# Patient Record
Sex: Female | Born: 1945
Health system: Southern US, Community
[De-identification: ages and names within clinical notes are randomized; demographics above are authoritative.]

## PROBLEM LIST (undated history)

## (undated) DIAGNOSIS — K639 Disease of intestine, unspecified: Secondary | ICD-10-CM

## (undated) DIAGNOSIS — G8929 Other chronic pain: Secondary | ICD-10-CM

## (undated) DIAGNOSIS — F32A Depression, unspecified: Secondary | ICD-10-CM

## (undated) DIAGNOSIS — F329 Major depressive disorder, single episode, unspecified: Secondary | ICD-10-CM

## (undated) DIAGNOSIS — M1990 Unspecified osteoarthritis, unspecified site: Secondary | ICD-10-CM

## (undated) DIAGNOSIS — G479 Sleep disorder, unspecified: Secondary | ICD-10-CM

## (undated) DIAGNOSIS — E119 Type 2 diabetes mellitus without complications: Secondary | ICD-10-CM

## (undated) DIAGNOSIS — R21 Rash and other nonspecific skin eruption: Secondary | ICD-10-CM

## (undated) DIAGNOSIS — Z87442 Personal history of urinary calculi: Secondary | ICD-10-CM

## (undated) DIAGNOSIS — M25551 Pain in right hip: Secondary | ICD-10-CM

## (undated) DIAGNOSIS — K222 Esophageal obstruction: Secondary | ICD-10-CM

## (undated) DIAGNOSIS — I700 Atherosclerosis of aorta: Secondary | ICD-10-CM

## (undated) DIAGNOSIS — I7789 Other specified disorders of arteries and arterioles: Secondary | ICD-10-CM

## (undated) DIAGNOSIS — I219 Acute myocardial infarction, unspecified: Secondary | ICD-10-CM

## (undated) DIAGNOSIS — K449 Diaphragmatic hernia without obstruction or gangrene: Secondary | ICD-10-CM

## (undated) DIAGNOSIS — I471 Supraventricular tachycardia, unspecified: Secondary | ICD-10-CM

## (undated) DIAGNOSIS — M329 Systemic lupus erythematosus, unspecified: Secondary | ICD-10-CM

## (undated) DIAGNOSIS — G4700 Insomnia, unspecified: Secondary | ICD-10-CM

## (undated) DIAGNOSIS — R0600 Dyspnea, unspecified: Secondary | ICD-10-CM

## (undated) DIAGNOSIS — E785 Hyperlipidemia, unspecified: Secondary | ICD-10-CM

## (undated) DIAGNOSIS — I77810 Thoracic aortic ectasia: Secondary | ICD-10-CM

## (undated) DIAGNOSIS — J45909 Unspecified asthma, uncomplicated: Secondary | ICD-10-CM

## (undated) DIAGNOSIS — K2289 Other specified disease of esophagus: Secondary | ICD-10-CM

## (undated) DIAGNOSIS — R7303 Prediabetes: Secondary | ICD-10-CM

## (undated) DIAGNOSIS — I10 Essential (primary) hypertension: Secondary | ICD-10-CM

## (undated) DIAGNOSIS — F419 Anxiety disorder, unspecified: Secondary | ICD-10-CM

## (undated) DIAGNOSIS — K589 Irritable bowel syndrome without diarrhea: Secondary | ICD-10-CM

## (undated) DIAGNOSIS — R1310 Dysphagia, unspecified: Secondary | ICD-10-CM

## (undated) DIAGNOSIS — M797 Fibromyalgia: Secondary | ICD-10-CM

## (undated) DIAGNOSIS — I5189 Other ill-defined heart diseases: Secondary | ICD-10-CM

## (undated) DIAGNOSIS — I519 Heart disease, unspecified: Secondary | ICD-10-CM

## (undated) DIAGNOSIS — R0602 Shortness of breath: Secondary | ICD-10-CM

## (undated) DIAGNOSIS — J449 Chronic obstructive pulmonary disease, unspecified: Secondary | ICD-10-CM

## (undated) DIAGNOSIS — M1712 Unilateral primary osteoarthritis, left knee: Secondary | ICD-10-CM

## (undated) DIAGNOSIS — H579 Unspecified disorder of eye and adnexa: Secondary | ICD-10-CM

## (undated) DIAGNOSIS — K219 Gastro-esophageal reflux disease without esophagitis: Secondary | ICD-10-CM

## (undated) DIAGNOSIS — I491 Atrial premature depolarization: Secondary | ICD-10-CM

## (undated) DIAGNOSIS — I2510 Atherosclerotic heart disease of native coronary artery without angina pectoris: Secondary | ICD-10-CM

## (undated) DIAGNOSIS — R531 Weakness: Secondary | ICD-10-CM

## (undated) DIAGNOSIS — M2550 Pain in unspecified joint: Secondary | ICD-10-CM

## (undated) DIAGNOSIS — Z86010 Personal history of colonic polyps: Secondary | ICD-10-CM

## (undated) DIAGNOSIS — Z96659 Presence of unspecified artificial knee joint: Secondary | ICD-10-CM

## (undated) DIAGNOSIS — H269 Unspecified cataract: Secondary | ICD-10-CM

## (undated) DIAGNOSIS — R51 Headache: Secondary | ICD-10-CM

## (undated) DIAGNOSIS — R0609 Other forms of dyspnea: Secondary | ICD-10-CM

## (undated) HISTORY — DX: Unspecified cataract: H26.9

## (undated) HISTORY — DX: Hyperlipidemia, unspecified: E78.5

## (undated) HISTORY — DX: Shortness of breath: R06.02

## (undated) HISTORY — DX: Headache: R51

## (undated) HISTORY — DX: Weakness: R53.1

## (undated) HISTORY — DX: Disease of intestine, unspecified: K63.9

## (undated) HISTORY — DX: Other forms of dyspnea: R06.09

## (undated) HISTORY — PX: WRIST SURGERY: SHX841

## (undated) HISTORY — PX: OTHER SURGICAL HISTORY: SHX169

## (undated) HISTORY — DX: Insomnia, unspecified: G47.00

## (undated) HISTORY — DX: Heart disease, unspecified: I51.9

## (undated) HISTORY — PX: BLADDER SUSPENSION: SHX72

## (undated) HISTORY — DX: Rash and other nonspecific skin eruption: R21

## (undated) HISTORY — DX: Chronic obstructive pulmonary disease, unspecified: J44.9

## (undated) HISTORY — DX: Unspecified osteoarthritis, unspecified site: M19.90

## (undated) HISTORY — DX: Type 2 diabetes mellitus without complications: E11.9

## (undated) HISTORY — DX: Pain in unspecified joint: M25.50

## (undated) HISTORY — DX: Fibromyalgia: M79.7

## (undated) HISTORY — PX: BREAST BIOPSY: SHX20

## (undated) HISTORY — PX: TUBAL LIGATION: SHX77

## (undated) HISTORY — PX: ABDOMINAL HYSTERECTOMY: SHX81

## (undated) HISTORY — PX: CHOLECYSTECTOMY: SHX55

## (undated) HISTORY — PX: KNEE SURGERY: SHX244

## (undated) HISTORY — DX: Personal history of urinary calculi: Z87.442

## (undated) HISTORY — DX: Unspecified disorder of eye and adnexa: H57.9

## (undated) HISTORY — DX: Irritable bowel syndrome without diarrhea: K58.9

## (undated) HISTORY — DX: Essential (primary) hypertension: I10

## (undated) HISTORY — DX: Sleep disorder, unspecified: G47.9

---
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
## 2011-09-04 HISTORY — PX: COLONOSCOPY: SHX174

## 2013-06-10 DIAGNOSIS — J309 Allergic rhinitis, unspecified: Secondary | ICD-10-CM | POA: Diagnosis not present

## 2013-06-24 ENCOUNTER — Ambulatory Visit: Payer: Self-pay | Admitting: Podiatrist

## 2013-07-08 ENCOUNTER — Ambulatory Visit: Payer: Self-pay | Admitting: Podiatrist

## 2013-07-20 DIAGNOSIS — J449 Chronic obstructive pulmonary disease, unspecified: Secondary | ICD-10-CM | POA: Diagnosis not present

## 2013-08-03 DIAGNOSIS — R1012 Left upper quadrant pain: Secondary | ICD-10-CM | POA: Diagnosis not present

## 2013-08-03 DIAGNOSIS — R29810 Facial weakness: Secondary | ICD-10-CM | POA: Diagnosis not present

## 2013-08-03 DIAGNOSIS — R109 Unspecified abdominal pain: Secondary | ICD-10-CM | POA: Diagnosis not present

## 2013-08-03 DIAGNOSIS — Z79899 Other long term (current) drug therapy: Secondary | ICD-10-CM | POA: Diagnosis not present

## 2013-08-03 DIAGNOSIS — R1013 Epigastric pain: Secondary | ICD-10-CM | POA: Diagnosis not present

## 2013-08-03 DIAGNOSIS — R5383 Other fatigue: Secondary | ICD-10-CM | POA: Diagnosis not present

## 2013-08-03 DIAGNOSIS — E119 Type 2 diabetes mellitus without complications: Secondary | ICD-10-CM | POA: Diagnosis not present

## 2013-08-03 DIAGNOSIS — R5381 Other malaise: Secondary | ICD-10-CM | POA: Diagnosis not present

## 2013-08-03 DIAGNOSIS — R110 Nausea: Secondary | ICD-10-CM | POA: Diagnosis not present

## 2013-08-03 DIAGNOSIS — F17200 Nicotine dependence, unspecified, uncomplicated: Secondary | ICD-10-CM | POA: Diagnosis not present

## 2013-08-03 DIAGNOSIS — M1990 Unspecified osteoarthritis, unspecified site: Secondary | ICD-10-CM | POA: Diagnosis not present

## 2013-08-03 DIAGNOSIS — J449 Chronic obstructive pulmonary disease, unspecified: Secondary | ICD-10-CM | POA: Diagnosis not present

## 2013-08-03 DIAGNOSIS — R0602 Shortness of breath: Secondary | ICD-10-CM | POA: Diagnosis not present

## 2013-08-03 DIAGNOSIS — I10 Essential (primary) hypertension: Secondary | ICD-10-CM | POA: Diagnosis not present

## 2013-08-03 DIAGNOSIS — N200 Calculus of kidney: Secondary | ICD-10-CM | POA: Diagnosis not present

## 2013-08-08 DIAGNOSIS — R6889 Other general symptoms and signs: Secondary | ICD-10-CM | POA: Diagnosis not present

## 2013-08-15 DIAGNOSIS — R911 Solitary pulmonary nodule: Secondary | ICD-10-CM | POA: Diagnosis not present

## 2013-08-15 DIAGNOSIS — R0989 Other specified symptoms and signs involving the circulatory and respiratory systems: Secondary | ICD-10-CM | POA: Diagnosis not present

## 2013-08-15 DIAGNOSIS — J8410 Pulmonary fibrosis, unspecified: Secondary | ICD-10-CM | POA: Diagnosis not present

## 2013-08-19 DIAGNOSIS — F329 Major depressive disorder, single episode, unspecified: Secondary | ICD-10-CM | POA: Diagnosis not present

## 2013-08-19 DIAGNOSIS — F3289 Other specified depressive episodes: Secondary | ICD-10-CM | POA: Diagnosis not present

## 2013-08-19 DIAGNOSIS — J3089 Other allergic rhinitis: Secondary | ICD-10-CM | POA: Diagnosis not present

## 2013-08-19 DIAGNOSIS — J019 Acute sinusitis, unspecified: Secondary | ICD-10-CM | POA: Diagnosis not present

## 2013-09-30 DIAGNOSIS — J301 Allergic rhinitis due to pollen: Secondary | ICD-10-CM | POA: Diagnosis not present

## 2013-09-30 DIAGNOSIS — J3081 Allergic rhinitis due to animal (cat) (dog) hair and dander: Secondary | ICD-10-CM | POA: Diagnosis not present

## 2013-10-30 DIAGNOSIS — M545 Low back pain, unspecified: Secondary | ICD-10-CM | POA: Diagnosis not present

## 2013-10-30 DIAGNOSIS — Z23 Encounter for immunization: Secondary | ICD-10-CM | POA: Diagnosis not present

## 2013-10-30 DIAGNOSIS — IMO0001 Reserved for inherently not codable concepts without codable children: Secondary | ICD-10-CM | POA: Diagnosis not present

## 2013-10-30 DIAGNOSIS — M25559 Pain in unspecified hip: Secondary | ICD-10-CM | POA: Diagnosis not present

## 2013-10-30 DIAGNOSIS — I10 Essential (primary) hypertension: Secondary | ICD-10-CM | POA: Diagnosis not present

## 2013-10-30 DIAGNOSIS — E785 Hyperlipidemia, unspecified: Secondary | ICD-10-CM | POA: Diagnosis not present

## 2013-10-30 DIAGNOSIS — E559 Vitamin D deficiency, unspecified: Secondary | ICD-10-CM | POA: Diagnosis not present

## 2013-10-30 DIAGNOSIS — Z0000 Encounter for general adult medical examination without abnormal findings: Secondary | ICD-10-CM | POA: Diagnosis not present

## 2013-11-06 DIAGNOSIS — M545 Low back pain, unspecified: Secondary | ICD-10-CM | POA: Diagnosis not present

## 2013-11-06 DIAGNOSIS — M25559 Pain in unspecified hip: Secondary | ICD-10-CM | POA: Diagnosis not present

## 2013-11-14 DIAGNOSIS — M899 Disorder of bone, unspecified: Secondary | ICD-10-CM | POA: Diagnosis not present

## 2013-11-14 DIAGNOSIS — M949 Disorder of cartilage, unspecified: Secondary | ICD-10-CM | POA: Diagnosis not present

## 2013-11-14 DIAGNOSIS — Z13820 Encounter for screening for osteoporosis: Secondary | ICD-10-CM | POA: Diagnosis not present

## 2013-11-14 DIAGNOSIS — Z1231 Encounter for screening mammogram for malignant neoplasm of breast: Secondary | ICD-10-CM | POA: Diagnosis not present

## 2013-11-17 DIAGNOSIS — Z1231 Encounter for screening mammogram for malignant neoplasm of breast: Secondary | ICD-10-CM | POA: Diagnosis not present

## 2014-01-13 DIAGNOSIS — M25569 Pain in unspecified knee: Secondary | ICD-10-CM | POA: Diagnosis not present

## 2014-01-13 DIAGNOSIS — G4700 Insomnia, unspecified: Secondary | ICD-10-CM | POA: Diagnosis not present

## 2014-01-13 DIAGNOSIS — R059 Cough, unspecified: Secondary | ICD-10-CM | POA: Diagnosis not present

## 2014-01-13 DIAGNOSIS — R05 Cough: Secondary | ICD-10-CM | POA: Diagnosis not present

## 2014-01-28 DIAGNOSIS — R079 Chest pain, unspecified: Secondary | ICD-10-CM | POA: Diagnosis not present

## 2014-01-28 DIAGNOSIS — R0602 Shortness of breath: Secondary | ICD-10-CM | POA: Diagnosis not present

## 2014-01-28 DIAGNOSIS — M1710 Unilateral primary osteoarthritis, unspecified knee: Secondary | ICD-10-CM | POA: Diagnosis not present

## 2014-01-28 DIAGNOSIS — IMO0002 Reserved for concepts with insufficient information to code with codable children: Secondary | ICD-10-CM | POA: Diagnosis not present

## 2014-01-28 DIAGNOSIS — M25569 Pain in unspecified knee: Secondary | ICD-10-CM | POA: Diagnosis not present

## 2014-02-10 DIAGNOSIS — M6281 Muscle weakness (generalized): Secondary | ICD-10-CM | POA: Diagnosis not present

## 2014-02-10 DIAGNOSIS — R269 Unspecified abnormalities of gait and mobility: Secondary | ICD-10-CM | POA: Diagnosis not present

## 2014-02-10 DIAGNOSIS — R404 Transient alteration of awareness: Secondary | ICD-10-CM | POA: Diagnosis not present

## 2014-02-10 DIAGNOSIS — R51 Headache: Secondary | ICD-10-CM | POA: Diagnosis not present

## 2014-02-10 DIAGNOSIS — I10 Essential (primary) hypertension: Secondary | ICD-10-CM | POA: Diagnosis not present

## 2014-02-10 DIAGNOSIS — H538 Other visual disturbances: Secondary | ICD-10-CM | POA: Diagnosis not present

## 2014-02-18 DIAGNOSIS — M25569 Pain in unspecified knee: Secondary | ICD-10-CM | POA: Diagnosis not present

## 2014-02-18 DIAGNOSIS — M1710 Unilateral primary osteoarthritis, unspecified knee: Secondary | ICD-10-CM | POA: Diagnosis not present

## 2014-03-04 DIAGNOSIS — I10 Essential (primary) hypertension: Secondary | ICD-10-CM | POA: Diagnosis not present

## 2014-03-04 DIAGNOSIS — Z01810 Encounter for preprocedural cardiovascular examination: Secondary | ICD-10-CM | POA: Diagnosis not present

## 2014-03-04 DIAGNOSIS — F17200 Nicotine dependence, unspecified, uncomplicated: Secondary | ICD-10-CM | POA: Diagnosis not present

## 2014-03-04 DIAGNOSIS — J449 Chronic obstructive pulmonary disease, unspecified: Secondary | ICD-10-CM | POA: Diagnosis not present

## 2014-03-04 DIAGNOSIS — E119 Type 2 diabetes mellitus without complications: Secondary | ICD-10-CM | POA: Diagnosis not present

## 2014-03-04 DIAGNOSIS — E785 Hyperlipidemia, unspecified: Secondary | ICD-10-CM | POA: Diagnosis not present

## 2014-04-20 DIAGNOSIS — J209 Acute bronchitis, unspecified: Secondary | ICD-10-CM | POA: Diagnosis not present

## 2014-04-20 DIAGNOSIS — J309 Allergic rhinitis, unspecified: Secondary | ICD-10-CM | POA: Diagnosis not present

## 2014-06-05 DIAGNOSIS — I10 Essential (primary) hypertension: Secondary | ICD-10-CM | POA: Diagnosis not present

## 2014-06-05 DIAGNOSIS — F339 Major depressive disorder, recurrent, unspecified: Secondary | ICD-10-CM | POA: Diagnosis not present

## 2014-06-05 DIAGNOSIS — E119 Type 2 diabetes mellitus without complications: Secondary | ICD-10-CM | POA: Diagnosis not present

## 2014-06-05 DIAGNOSIS — E785 Hyperlipidemia, unspecified: Secondary | ICD-10-CM | POA: Diagnosis not present

## 2014-09-04 DIAGNOSIS — M7520 Bicipital tendinitis, unspecified shoulder: Secondary | ICD-10-CM | POA: Diagnosis not present

## 2014-09-04 DIAGNOSIS — M797 Fibromyalgia: Secondary | ICD-10-CM | POA: Diagnosis not present

## 2014-09-04 DIAGNOSIS — M545 Low back pain: Secondary | ICD-10-CM | POA: Diagnosis not present

## 2014-09-04 DIAGNOSIS — G894 Chronic pain syndrome: Secondary | ICD-10-CM | POA: Diagnosis not present

## 2014-09-30 DIAGNOSIS — S3992XA Unspecified injury of lower back, initial encounter: Secondary | ICD-10-CM | POA: Diagnosis not present

## 2014-09-30 DIAGNOSIS — S8992XA Unspecified injury of left lower leg, initial encounter: Secondary | ICD-10-CM | POA: Diagnosis not present

## 2014-09-30 DIAGNOSIS — S99922A Unspecified injury of left foot, initial encounter: Secondary | ICD-10-CM | POA: Diagnosis not present

## 2014-09-30 DIAGNOSIS — S39012A Strain of muscle, fascia and tendon of lower back, initial encounter: Secondary | ICD-10-CM | POA: Diagnosis not present

## 2014-09-30 DIAGNOSIS — S199XXA Unspecified injury of neck, initial encounter: Secondary | ICD-10-CM | POA: Diagnosis not present

## 2014-09-30 DIAGNOSIS — M7989 Other specified soft tissue disorders: Secondary | ICD-10-CM | POA: Diagnosis not present

## 2014-09-30 DIAGNOSIS — T148 Other injury of unspecified body region: Secondary | ICD-10-CM | POA: Diagnosis not present

## 2014-09-30 DIAGNOSIS — M542 Cervicalgia: Secondary | ICD-10-CM | POA: Diagnosis not present

## 2014-09-30 DIAGNOSIS — M25462 Effusion, left knee: Secondary | ICD-10-CM | POA: Diagnosis not present

## 2014-09-30 DIAGNOSIS — M79672 Pain in left foot: Secondary | ICD-10-CM | POA: Diagnosis not present

## 2014-09-30 DIAGNOSIS — S79922A Unspecified injury of left thigh, initial encounter: Secondary | ICD-10-CM | POA: Diagnosis not present

## 2014-09-30 DIAGNOSIS — M79662 Pain in left lower leg: Secondary | ICD-10-CM | POA: Diagnosis not present

## 2014-09-30 DIAGNOSIS — S93602A Unspecified sprain of left foot, initial encounter: Secondary | ICD-10-CM | POA: Diagnosis not present

## 2014-09-30 DIAGNOSIS — S96912A Strain of unspecified muscle and tendon at ankle and foot level, left foot, initial encounter: Secondary | ICD-10-CM | POA: Diagnosis not present

## 2014-09-30 DIAGNOSIS — S161XXA Strain of muscle, fascia and tendon at neck level, initial encounter: Secondary | ICD-10-CM | POA: Diagnosis not present

## 2014-09-30 DIAGNOSIS — S8392XA Sprain of unspecified site of left knee, initial encounter: Secondary | ICD-10-CM | POA: Diagnosis not present

## 2014-09-30 DIAGNOSIS — M25562 Pain in left knee: Secondary | ICD-10-CM | POA: Diagnosis not present

## 2014-09-30 DIAGNOSIS — M545 Low back pain: Secondary | ICD-10-CM | POA: Diagnosis not present

## 2014-09-30 DIAGNOSIS — S86812A Strain of other muscle(s) and tendon(s) at lower leg level, left leg, initial encounter: Secondary | ICD-10-CM | POA: Diagnosis not present

## 2014-10-07 DIAGNOSIS — M25362 Other instability, left knee: Secondary | ICD-10-CM | POA: Diagnosis not present

## 2014-10-07 DIAGNOSIS — W19XXXA Unspecified fall, initial encounter: Secondary | ICD-10-CM | POA: Diagnosis not present

## 2014-10-07 DIAGNOSIS — M25562 Pain in left knee: Secondary | ICD-10-CM | POA: Diagnosis not present

## 2014-10-07 DIAGNOSIS — M545 Low back pain: Secondary | ICD-10-CM | POA: Diagnosis not present

## 2014-10-16 DIAGNOSIS — M23212 Derangement of anterior horn of medial meniscus due to old tear or injury, left knee: Secondary | ICD-10-CM | POA: Diagnosis not present

## 2014-10-16 DIAGNOSIS — M25462 Effusion, left knee: Secondary | ICD-10-CM | POA: Diagnosis not present

## 2014-10-16 DIAGNOSIS — M25362 Other instability, left knee: Secondary | ICD-10-CM | POA: Diagnosis not present

## 2014-10-16 DIAGNOSIS — M179 Osteoarthritis of knee, unspecified: Secondary | ICD-10-CM | POA: Diagnosis not present

## 2014-10-16 DIAGNOSIS — S83242A Other tear of medial meniscus, current injury, left knee, initial encounter: Secondary | ICD-10-CM | POA: Diagnosis not present

## 2014-10-16 DIAGNOSIS — M1712 Unilateral primary osteoarthritis, left knee: Secondary | ICD-10-CM | POA: Diagnosis not present

## 2014-10-29 DIAGNOSIS — M1710 Unilateral primary osteoarthritis, unspecified knee: Secondary | ICD-10-CM | POA: Diagnosis not present

## 2014-11-19 DIAGNOSIS — M1712 Unilateral primary osteoarthritis, left knee: Secondary | ICD-10-CM | POA: Diagnosis not present

## 2014-12-09 DIAGNOSIS — E785 Hyperlipidemia, unspecified: Secondary | ICD-10-CM | POA: Diagnosis not present

## 2014-12-09 DIAGNOSIS — M179 Osteoarthritis of knee, unspecified: Secondary | ICD-10-CM | POA: Diagnosis not present

## 2014-12-09 DIAGNOSIS — E119 Type 2 diabetes mellitus without complications: Secondary | ICD-10-CM | POA: Diagnosis not present

## 2014-12-09 DIAGNOSIS — Z01810 Encounter for preprocedural cardiovascular examination: Secondary | ICD-10-CM | POA: Diagnosis not present

## 2014-12-09 DIAGNOSIS — I10 Essential (primary) hypertension: Secondary | ICD-10-CM | POA: Diagnosis not present

## 2014-12-17 DIAGNOSIS — R9431 Abnormal electrocardiogram [ECG] [EKG]: Secondary | ICD-10-CM | POA: Diagnosis not present

## 2014-12-17 DIAGNOSIS — R0789 Other chest pain: Secondary | ICD-10-CM | POA: Diagnosis not present

## 2014-12-17 DIAGNOSIS — R079 Chest pain, unspecified: Secondary | ICD-10-CM | POA: Diagnosis not present

## 2014-12-24 ENCOUNTER — Other Ambulatory Visit: Payer: Self-pay | Admitting: Orthopedic Surgery

## 2015-01-28 ENCOUNTER — Encounter (HOSPITAL_COMMUNITY)
Admission: RE | Admit: 2015-01-28 | Discharge: 2015-01-28 | Disposition: A | Discharge status: Home / Self Care | Payer: Medicare Other | Source: Ambulatory Visit | Attending: Orthopedic Surgery | Admitting: Orthopedic Surgery

## 2015-01-28 ENCOUNTER — Ambulatory Visit (HOSPITAL_COMMUNITY)
Admission: RE | Admit: 2015-01-28 | Discharge: 2015-01-28 | Disposition: A | Discharge status: Home / Self Care | Payer: Medicare Other | Source: Ambulatory Visit | Attending: Orthopedic Surgery | Admitting: Orthopedic Surgery

## 2015-01-28 ENCOUNTER — Encounter (HOSPITAL_COMMUNITY): Payer: Self-pay

## 2015-01-28 DIAGNOSIS — Z01812 Encounter for preprocedural laboratory examination: Secondary | ICD-10-CM | POA: Insufficient documentation

## 2015-01-28 DIAGNOSIS — M5136 Other intervertebral disc degeneration, lumbar region: Secondary | ICD-10-CM | POA: Insufficient documentation

## 2015-01-28 DIAGNOSIS — Z01818 Encounter for other preprocedural examination: Secondary | ICD-10-CM | POA: Insufficient documentation

## 2015-01-28 DIAGNOSIS — M40209 Unspecified kyphosis, site unspecified: Secondary | ICD-10-CM | POA: Diagnosis not present

## 2015-01-28 DIAGNOSIS — J449 Chronic obstructive pulmonary disease, unspecified: Secondary | ICD-10-CM | POA: Diagnosis not present

## 2015-01-28 DIAGNOSIS — M1712 Unilateral primary osteoarthritis, left knee: Secondary | ICD-10-CM | POA: Insufficient documentation

## 2015-01-28 HISTORY — DX: Unspecified asthma, uncomplicated: J45.909

## 2015-01-28 HISTORY — DX: Acute myocardial infarction, unspecified: I21.9

## 2015-01-28 HISTORY — DX: Depression, unspecified: F32.A

## 2015-01-28 HISTORY — DX: Gastro-esophageal reflux disease without esophagitis: K21.9

## 2015-01-28 HISTORY — DX: Other specified disorders of arteries and arterioles: I77.89

## 2015-01-28 HISTORY — DX: Major depressive disorder, single episode, unspecified: F32.9

## 2015-01-28 LAB — COMPREHENSIVE METABOLIC PANEL
ALBUMIN: 4.1 g/dL (ref 3.5–5.0)
ALK PHOS: 73 U/L (ref 38–126)
ALT: 14 U/L (ref 14–54)
AST: 21 U/L (ref 15–41)
Anion gap: 9 (ref 5–15)
BILIRUBIN TOTAL: 0.4 mg/dL (ref 0.3–1.2)
BUN: 9 mg/dL (ref 6–20)
CALCIUM: 9.7 mg/dL (ref 8.9–10.3)
CO2: 28 mmol/L (ref 22–32)
Chloride: 100 mmol/L — ABNORMAL LOW (ref 101–111)
Creatinine, Ser: 0.77 mg/dL (ref 0.44–1.00)
GFR calc Af Amer: >60 mL/min (ref >60)
GFR calc non Af Amer: >60 mL/min (ref >60)
GLUCOSE: 89 mg/dL (ref 65–99)
Potassium: 3.4 mmol/L — ABNORMAL LOW (ref 3.5–5.1)
Sodium: 137 mmol/L (ref 135–145)
TOTAL PROTEIN: 6.9 g/dL (ref 6.5–8.1)

## 2015-01-28 LAB — URINALYSIS, ROUTINE W REFLEX MICROSCOPIC
Bilirubin Urine: NEGATIVE
Glucose, UA: NEGATIVE mg/dL
HGB URINE DIPSTICK: NEGATIVE
Ketones, ur: NEGATIVE mg/dL
LEUKOCYTES UA: NEGATIVE
Nitrite: NEGATIVE
Protein, ur: NEGATIVE mg/dL
SPECIFIC GRAVITY, URINE: 1.011 (ref 1.005–1.030)
Urobilinogen, UA: 1 mg/dL (ref 0.0–1.0)
pH: 7 (ref 5.0–8.0)

## 2015-01-28 LAB — CBC WITH DIFFERENTIAL/PLATELET
BASOS ABS: 0.1 10*3/uL (ref 0.0–0.1)
BASOS PCT: 1 % (ref 0–1)
Eosinophils Absolute: 0.1 10*3/uL (ref 0.0–0.7)
Eosinophils Relative: 1 % (ref 0–5)
HEMATOCRIT: 43.2 % (ref 36.0–46.0)
HEMOGLOBIN: 14.8 g/dL (ref 12.0–15.0)
Lymphocytes Relative: 30 % (ref 12–46)
Lymphs Abs: 2.5 10*3/uL (ref 0.7–4.0)
MCH: 32.4 pg (ref 26.0–34.0)
MCHC: 34.3 g/dL (ref 30.0–36.0)
MCV: 94.5 fL (ref 78.0–100.0)
MONOS PCT: 8 % (ref 3–12)
Monocytes Absolute: 0.7 10*3/uL (ref 0.1–1.0)
NEUTROS ABS: 5.2 10*3/uL (ref 1.7–7.7)
NEUTROS PCT: 60 % (ref 43–77)
Platelets: 210 10*3/uL (ref 150–400)
RBC: 4.57 MIL/uL (ref 3.87–5.11)
RDW: 13.6 % (ref 11.5–15.5)
WBC: 8.5 10*3/uL (ref 4.0–10.5)

## 2015-01-28 LAB — APTT: aPTT: 28 seconds (ref 24–37)

## 2015-01-28 LAB — GLUCOSE, CAPILLARY: GLUCOSE-CAPILLARY: 141 mg/dL — AB (ref 65–99)

## 2015-01-28 LAB — PROTIME-INR
INR: 1.02 (ref 0.00–1.49)
Prothrombin Time: 13.6 seconds (ref 11.6–15.2)

## 2015-01-28 LAB — SURGICAL PCR SCREEN
MRSA, PCR: NEGATIVE
Staphylococcus aureus: NEGATIVE

## 2015-01-28 NOTE — Pre-Procedure Instructions (Signed)
    [name] [name]  01/28/2015     No Pharmacies Listed   Your procedure is scheduled on 02-08-2015  Monday .  Report to [facility] Admitting at 5:30 A.M.   Call this number if you have problems the morning of surgery:  [phone]   Remember:  Do not eat food or drink liquids after midnight .  Take these medicines the morning of surgery with A SIP OF WATER Bupropion(Wellbutrin),inhalers if needed,omeprazole(Prilosec)               NO DIABETES MEDICATION THE MORNING OF SURGERY   Do not wear jewelry, make-up or nail polish.  Do not wear lotions, powders, or perfumes.  You may NOT wear deodorant.  Do not shave 48 hours prior to surgery.     Do not bring valuables to the hospital.  [facility] is not responsible for any belongings or valuables.  Contacts, dentures or bridgework may not be worn into surgery.  Leave your suitcase in the car.  After surgery it may be brought to your room.  For patients admitted to the hospital, discharge time will be determined by your treatment team.  Patients discharged the day of surgery will not be allowed to drive home.    Special instructions:  See attached Sheet "Preparing for Surgery for instructions on CHG showers  Please read over the following fact sheets that you were given. Pain Booklet, Coughing and Deep Breathing and Surgical Site Infection Prevention

## 2015-01-28 NOTE — Progress Notes (Signed)
Requested records from [facility] test and EKG.  Requested records from [facility] ,[facility], any cardiac records available.

## 2015-01-29 LAB — URINE CULTURE

## 2015-01-29 LAB — HEMOGLOBIN A1C
HEMOGLOBIN A1C: 5.5 % (ref 4.8–5.6)
MEAN PLASMA GLUCOSE: 111 mg/dL

## 2015-02-04 DIAGNOSIS — M1712 Unilateral primary osteoarthritis, left knee: Secondary | ICD-10-CM

## 2015-02-04 DIAGNOSIS — M25562 Pain in left knee: Secondary | ICD-10-CM | POA: Diagnosis not present

## 2015-02-04 HISTORY — DX: Unilateral primary osteoarthritis, left knee: M17.12

## 2015-02-05 MED ORDER — TRANEXAMIC ACID 1000 MG/10ML IV SOLN
1000.0000 mg | INTRAVENOUS | Status: DC
  Filled 2015-02-05: qty 10

## 2015-02-05 MED ORDER — BUPIVACAINE LIPOSOME 1.3 % IJ SUSP
20.0000 mL | Freq: Once | INTRAMUSCULAR | Status: DC
  Filled 2015-02-05: qty 20

## 2015-02-07 MED ORDER — CEFAZOLIN SODIUM-DEXTROSE 2-3 GM-% IV SOLR: 2.0000 g | INTRAVENOUS | Status: DC

## 2015-02-08 ENCOUNTER — Encounter (HOSPITAL_COMMUNITY): Admission: RE | Payer: Self-pay | Source: Ambulatory Visit

## 2015-02-08 ENCOUNTER — Inpatient Hospital Stay (HOSPITAL_COMMUNITY): Admission: RE | Admit: 2015-02-08 | Payer: Medicare Other | Source: Ambulatory Visit | Admitting: Orthopedic Surgery

## 2015-02-08 SURGERY — ARTHROPLASTY, KNEE, TOTAL
Anesthesia: Spinal | Laterality: Left

## 2015-02-09 ENCOUNTER — Other Ambulatory Visit: Payer: Self-pay | Admitting: Orthopedic Surgery

## 2015-02-09 DIAGNOSIS — J441 Chronic obstructive pulmonary disease with (acute) exacerbation: Secondary | ICD-10-CM | POA: Diagnosis not present

## 2015-02-12 NOTE — Progress Notes (Signed)
Verified with pt time of arrival of 5:30 AM on Monday, 02/15/15.

## 2015-02-14 MED ORDER — TRANEXAMIC ACID 1000 MG/10ML IV SOLN
1000.0000 mg | INTRAVENOUS | Status: AC
  Administered 2015-02-15: 1000 mg via INTRAVENOUS
  Filled 2015-02-14: qty 10

## 2015-02-14 MED ORDER — TRANEXAMIC ACID 1000 MG/10ML IV SOLN
1000.0000 mg | INTRAVENOUS | Status: DC
  Filled 2015-02-14: qty 10

## 2015-02-14 NOTE — Anesthesia Preprocedure Evaluation (Addendum)
Anesthesia Evaluation  Patient identified by MRN, date of birth, ID band Patient awake    Reviewed: Allergy & Precautions, NPO status , Patient's Chart, lab work & pertinent test results  Airway Mallampati: II  TM Distance: >3 FB Neck ROM: Full    Dental no notable dental hx. (+) Edentulous Upper, Edentulous Lower   Pulmonary shortness of breath and with exertion, asthma , COPD,  COPD inhaler, Current Smoker,    Pulmonary exam normal breath sounds clear to auscultation       Cardiovascular hypertension, Pt. on medications + Past MI and + Peripheral Vascular Disease  Normal cardiovascular exam Rhythm:Regular Rate:Normal     Neuro/Psych  Headaches, PSYCHIATRIC DISORDERS Depression negative psych ROS   GI/Hepatic Neg liver ROS, GERD  ,  Endo/Other  diabetes, Type 2  Renal/GU negative Renal ROS     Musculoskeletal  (+) Arthritis , Fibromyalgia -  Abdominal   Peds  Hematology negative hematology ROS (+)   Anesthesia Other Findings   Reproductive/Obstetrics negative OB ROS                            Anesthesia Physical Anesthesia Plan  ASA: III  Anesthesia Plan: Regional, Spinal and MAC   Post-op Pain Management: MAC Combined w/ Regional for Post-op pain   Induction:   Airway Management Planned: Simple Face Mask  Additional Equipment:   Intra-op Plan:   Post-operative Plan:   Informed Consent: I have reviewed the patients History and Physical, chart, labs and discussed the procedure including the risks, benefits and alternatives for the proposed anesthesia with the patient or authorized representative who has indicated his/her understanding and acceptance.   Dental advisory given  Plan Discussed with: CRNA  Anesthesia Plan Comments:         Anesthesia Quick Evaluation

## 2015-02-15 ENCOUNTER — Inpatient Hospital Stay (HOSPITAL_COMMUNITY)
Admission: AD | Admit: 2015-02-15 | Discharge: 2015-02-17 | DRG: 470 | Disposition: A | Discharge status: Home Health | Payer: Medicare Other | Source: Ambulatory Visit | Attending: Orthopedic Surgery | Admitting: Orthopedic Surgery

## 2015-02-15 ENCOUNTER — Inpatient Hospital Stay (HOSPITAL_COMMUNITY): Payer: Medicare Other | Admitting: Anesthesiology

## 2015-02-15 ENCOUNTER — Encounter (HOSPITAL_COMMUNITY)
Admission: AD | Disposition: A | Discharge status: Home Health | Payer: Medicare Other | Source: Ambulatory Visit | Attending: Orthopedic Surgery

## 2015-02-15 ENCOUNTER — Encounter (HOSPITAL_COMMUNITY): Payer: Self-pay | Admitting: *Deleted

## 2015-02-15 DIAGNOSIS — F17210 Nicotine dependence, cigarettes, uncomplicated: Secondary | ICD-10-CM | POA: Diagnosis present

## 2015-02-15 DIAGNOSIS — M1712 Unilateral primary osteoarthritis, left knee: Principal | ICD-10-CM | POA: Diagnosis present

## 2015-02-15 DIAGNOSIS — Z79899 Other long term (current) drug therapy: Secondary | ICD-10-CM

## 2015-02-15 DIAGNOSIS — M797 Fibromyalgia: Secondary | ICD-10-CM | POA: Diagnosis present

## 2015-02-15 DIAGNOSIS — M179 Osteoarthritis of knee, unspecified: Secondary | ICD-10-CM | POA: Diagnosis not present

## 2015-02-15 DIAGNOSIS — J449 Chronic obstructive pulmonary disease, unspecified: Secondary | ICD-10-CM | POA: Diagnosis present

## 2015-02-15 DIAGNOSIS — K219 Gastro-esophageal reflux disease without esophagitis: Secondary | ICD-10-CM | POA: Diagnosis present

## 2015-02-15 DIAGNOSIS — Z96659 Presence of unspecified artificial knee joint: Secondary | ICD-10-CM

## 2015-02-15 DIAGNOSIS — I252 Old myocardial infarction: Secondary | ICD-10-CM

## 2015-02-15 DIAGNOSIS — G8918 Other acute postprocedural pain: Secondary | ICD-10-CM | POA: Diagnosis not present

## 2015-02-15 DIAGNOSIS — F329 Major depressive disorder, single episode, unspecified: Secondary | ICD-10-CM | POA: Diagnosis present

## 2015-02-15 DIAGNOSIS — I10 Essential (primary) hypertension: Secondary | ICD-10-CM | POA: Diagnosis present

## 2015-02-15 DIAGNOSIS — J45909 Unspecified asthma, uncomplicated: Secondary | ICD-10-CM | POA: Diagnosis present

## 2015-02-15 DIAGNOSIS — E119 Type 2 diabetes mellitus without complications: Secondary | ICD-10-CM | POA: Diagnosis present

## 2015-02-15 DIAGNOSIS — E785 Hyperlipidemia, unspecified: Secondary | ICD-10-CM | POA: Diagnosis present

## 2015-02-15 DIAGNOSIS — M25562 Pain in left knee: Secondary | ICD-10-CM | POA: Diagnosis not present

## 2015-02-15 HISTORY — DX: Presence of unspecified artificial knee joint: Z96.659

## 2015-02-15 HISTORY — PX: TOTAL KNEE ARTHROPLASTY: SHX125

## 2015-02-15 LAB — GLUCOSE, CAPILLARY
GLUCOSE-CAPILLARY: 91 mg/dL (ref 65–99)
GLUCOSE-CAPILLARY: 84 mg/dL (ref 65–99)
Glucose-Capillary: 82 mg/dL (ref 65–99)

## 2015-02-15 LAB — CBC
HCT: 39.3 % (ref 36.0–46.0)
HEMOGLOBIN: 13 g/dL (ref 12.0–15.0)
MCH: 31.3 pg (ref 26.0–34.0)
MCHC: 33.1 g/dL (ref 30.0–36.0)
MCV: 94.5 fL (ref 78.0–100.0)
PLATELETS: 186 10*3/uL (ref 150–400)
RBC: 4.16 MIL/uL (ref 3.87–5.11)
RDW: 14 % (ref 11.5–15.5)
WBC: 13.2 10*3/uL — AB (ref 4.0–10.5)

## 2015-02-15 LAB — CREATININE, SERUM
CREATININE: 0.77 mg/dL (ref 0.44–1.00)
GFR calc non Af Amer: >60 mL/min (ref >60)

## 2015-02-15 SURGERY — ARTHROPLASTY, KNEE, TOTAL
Anesthesia: Monitor Anesthesia Care | Site: Knee | Laterality: Left

## 2015-02-15 MED ORDER — DIPHENHYDRAMINE HCL 12.5 MG/5ML PO ELIX: 12.5000 mg | ORAL_SOLUTION | ORAL | Status: DC | PRN

## 2015-02-15 MED ORDER — CHLORHEXIDINE GLUCONATE 4 % EX LIQD: 60.0000 mL | Freq: Once | CUTANEOUS | Status: DC

## 2015-02-15 MED ORDER — METOCLOPRAMIDE HCL 5 MG/ML IJ SOLN
5.0000 mg | Freq: Three times a day (TID) | INTRAMUSCULAR | Status: DC | PRN
  Filled 2015-02-15: qty 2

## 2015-02-15 MED ORDER — MENTHOL 3 MG MT LOZG
1.0000 | LOZENGE | OROMUCOSAL | Status: DC | PRN
  Filled 2015-02-15: qty 9

## 2015-02-15 MED ORDER — PROPOFOL 10 MG/ML IV BOLUS
INTRAVENOUS | Status: AC
  Filled 2015-02-15: qty 20

## 2015-02-15 MED ORDER — PROPOFOL INFUSION 10 MG/ML OPTIME
INTRAVENOUS | Status: DC | PRN
  Administered 2015-02-15: 100 ug/kg/min via INTRAVENOUS

## 2015-02-15 MED ORDER — DOCUSATE SODIUM 100 MG PO CAPS
100.0000 mg | ORAL_CAPSULE | Freq: Two times a day (BID) | ORAL | Status: DC
  Administered 2015-02-15 – 2015-02-17 (×4): 100 mg via ORAL
  Filled 2015-02-15 (×4): qty 1

## 2015-02-15 MED ORDER — ZOLPIDEM TARTRATE 5 MG PO TABS: 5.0000 mg | ORAL_TABLET | Freq: Every evening | ORAL | Status: DC | PRN

## 2015-02-15 MED ORDER — METFORMIN HCL 500 MG PO TABS
500.0000 mg | ORAL_TABLET | Freq: Every day | ORAL | Status: DC
  Administered 2015-02-15 – 2015-02-17 (×2): 500 mg via ORAL
  Filled 2015-02-15 (×3): qty 1

## 2015-02-15 MED ORDER — LORATADINE 10 MG PO TABS
10.0000 mg | ORAL_TABLET | Freq: Every day | ORAL | Status: DC
  Administered 2015-02-16 – 2015-02-17 (×2): 10 mg via ORAL
  Filled 2015-02-15 (×2): qty 1

## 2015-02-15 MED ORDER — ENOXAPARIN SODIUM 30 MG/0.3ML ~~LOC~~ SOLN
30.0000 mg | Freq: Two times a day (BID) | SUBCUTANEOUS | Status: DC
  Administered 2015-02-16 – 2015-02-17 (×3): 30 mg via SUBCUTANEOUS
  Filled 2015-02-15 (×3): qty 0.3

## 2015-02-15 MED ORDER — PHENOL 1.4 % MT LIQD: 1.0000 | OROMUCOSAL | Status: DC | PRN

## 2015-02-15 MED ORDER — SENNOSIDES-DOCUSATE SODIUM 8.6-50 MG PO TABS: 1.0000 | ORAL_TABLET | Freq: Every evening | ORAL | Status: DC | PRN

## 2015-02-15 MED ORDER — BUPIVACAINE LIPOSOME 1.3 % IJ SUSP: 20.0000 mL | Freq: Once | INTRAMUSCULAR | Status: DC

## 2015-02-15 MED ORDER — BUPIVACAINE LIPOSOME 1.3 % IJ SUSP
20.0000 mL | INTRAMUSCULAR | Status: AC
  Administered 2015-02-15: 20 mL
  Filled 2015-02-15: qty 20

## 2015-02-15 MED ORDER — PHENYLEPHRINE HCL 10 MG/ML IJ SOLN
10.0000 mg | INTRAMUSCULAR | Status: DC | PRN
  Administered 2015-02-15: 50 ug/min via INTRAVENOUS

## 2015-02-15 MED ORDER — INSULIN ASPART 100 UNIT/ML ~~LOC~~ SOLN: 0.0000 [IU] | Freq: Three times a day (TID) | SUBCUTANEOUS | Status: DC

## 2015-02-15 MED ORDER — 0.9 % SODIUM CHLORIDE (POUR BTL) OPTIME
TOPICAL | Status: DC | PRN
  Administered 2015-02-15: 1000 mL

## 2015-02-15 MED ORDER — FLEET ENEMA 7-19 GM/118ML RE ENEM: 1.0000 | ENEMA | Freq: Once | RECTAL | Status: DC | PRN

## 2015-02-15 MED ORDER — ALUM & MAG HYDROXIDE-SIMETH 200-200-20 MG/5ML PO SUSP
30.0000 mL | ORAL | Status: DC | PRN
  Administered 2015-02-16: 30 mL via ORAL
  Filled 2015-02-15: qty 30

## 2015-02-15 MED ORDER — LACTATED RINGERS IV SOLN
INTRAVENOUS | Status: DC | PRN
  Administered 2015-02-15: 07:00:00 via INTRAVENOUS

## 2015-02-15 MED ORDER — SODIUM CHLORIDE 0.9 % IR SOLN
Status: DC | PRN
  Administered 2015-02-15: 1000 mL

## 2015-02-15 MED ORDER — PROPOFOL 10 MG/ML IV BOLUS
INTRAVENOUS | Status: DC | PRN
  Administered 2015-02-15: 20 mg via INTRAVENOUS

## 2015-02-15 MED ORDER — ONDANSETRON HCL 4 MG/2ML IJ SOLN
4.0000 mg | Freq: Four times a day (QID) | INTRAMUSCULAR | Status: DC | PRN
  Filled 2015-02-15: qty 2

## 2015-02-15 MED ORDER — MONTELUKAST SODIUM 10 MG PO TABS
10.0000 mg | ORAL_TABLET | Freq: Every day | ORAL | Status: DC
  Administered 2015-02-15 – 2015-02-16 (×2): 10 mg via ORAL
  Filled 2015-02-15 (×2): qty 1

## 2015-02-15 MED ORDER — MILNACIPRAN HCL 50 MG PO TABS
50.0000 mg | ORAL_TABLET | Freq: Two times a day (BID) | ORAL | Status: DC
  Administered 2015-02-15 – 2015-02-17 (×4): 50 mg via ORAL
  Filled 2015-02-15 (×8): qty 1

## 2015-02-15 MED ORDER — LISINOPRIL 40 MG PO TABS
40.0000 mg | ORAL_TABLET | Freq: Every day | ORAL | Status: DC
  Administered 2015-02-16 – 2015-02-17 (×2): 40 mg via ORAL
  Filled 2015-02-15 (×3): qty 1

## 2015-02-15 MED ORDER — EPHEDRINE SULFATE 50 MG/ML IJ SOLN
INTRAMUSCULAR | Status: DC | PRN
  Administered 2015-02-15: 10 mg via INTRAVENOUS
  Administered 2015-02-15: 20 mg via INTRAVENOUS
  Administered 2015-02-15: 10 mg via INTRAVENOUS

## 2015-02-15 MED ORDER — FENTANYL CITRATE (PF) 100 MCG/2ML IJ SOLN
INTRAMUSCULAR | Status: DC | PRN
  Administered 2015-02-15 (×3): 50 ug via INTRAVENOUS
  Administered 2015-02-15: 100 ug via INTRAVENOUS

## 2015-02-15 MED ORDER — CEFAZOLIN SODIUM 1-5 GM-% IV SOLN
1.0000 g | Freq: Four times a day (QID) | INTRAVENOUS | Status: AC
  Administered 2015-02-15 (×2): 1 g via INTRAVENOUS
  Filled 2015-02-15 (×2): qty 50

## 2015-02-15 MED ORDER — FENTANYL CITRATE (PF) 250 MCG/5ML IJ SOLN
INTRAMUSCULAR | Status: AC
  Filled 2015-02-15: qty 5

## 2015-02-15 MED ORDER — ONDANSETRON HCL 4 MG PO TABS
4.0000 mg | ORAL_TABLET | Freq: Four times a day (QID) | ORAL | Status: DC | PRN
  Administered 2015-02-15 – 2015-02-16 (×2): 4 mg via ORAL
  Filled 2015-02-15: qty 1

## 2015-02-15 MED ORDER — SODIUM CHLORIDE 0.9 % IV SOLN: INTRAVENOUS | Status: DC

## 2015-02-15 MED ORDER — PHENYLEPHRINE HCL 10 MG/ML IJ SOLN
INTRAMUSCULAR | Status: AC
  Filled 2015-02-15: qty 1

## 2015-02-15 MED ORDER — ALBUTEROL SULFATE (2.5 MG/3ML) 0.083% IN NEBU
2.5000 mg | INHALATION_SOLUTION | Freq: Four times a day (QID) | RESPIRATORY_TRACT | Status: DC | PRN
  Administered 2015-02-17: 2.5 mg via RESPIRATORY_TRACT
  Filled 2015-02-15: qty 3

## 2015-02-15 MED ORDER — HYDROCHLOROTHIAZIDE 25 MG PO TABS
25.0000 mg | ORAL_TABLET | Freq: Every day | ORAL | Status: DC
  Administered 2015-02-16 – 2015-02-17 (×2): 25 mg via ORAL
  Filled 2015-02-15 (×3): qty 1

## 2015-02-15 MED ORDER — PHENYLEPHRINE HCL 10 MG/ML IJ SOLN
INTRAMUSCULAR | Status: DC | PRN
  Administered 2015-02-15 (×3): 80 ug via INTRAVENOUS

## 2015-02-15 MED ORDER — PANTOPRAZOLE SODIUM 40 MG PO TBEC
80.0000 mg | DELAYED_RELEASE_TABLET | Freq: Every day | ORAL | Status: DC
  Administered 2015-02-15 – 2015-02-17 (×3): 80 mg via ORAL
  Filled 2015-02-15 (×3): qty 2

## 2015-02-15 MED ORDER — PRAVASTATIN SODIUM 40 MG PO TABS
40.0000 mg | ORAL_TABLET | Freq: Every day | ORAL | Status: DC
  Administered 2015-02-16 – 2015-02-17 (×2): 40 mg via ORAL
  Filled 2015-02-15 (×2): qty 1

## 2015-02-15 MED ORDER — LISINOPRIL-HYDROCHLOROTHIAZIDE 20-12.5 MG PO TABS: 2.0000 | ORAL_TABLET | Freq: Every day | ORAL | Status: DC

## 2015-02-15 MED ORDER — BUPROPION HCL ER (XL) 150 MG PO TB24
150.0000 mg | ORAL_TABLET | Freq: Every day | ORAL | Status: DC
Start: 2015-02-15 — End: 2015-02-17
  Administered 2015-02-15 – 2015-02-17 (×3): 150 mg via ORAL
  Filled 2015-02-15 (×3): qty 1

## 2015-02-15 MED ORDER — ONDANSETRON HCL 4 MG/2ML IJ SOLN
INTRAMUSCULAR | Status: AC
  Filled 2015-02-15: qty 2

## 2015-02-15 MED ORDER — PHENYLEPHRINE 40 MCG/ML (10ML) SYRINGE FOR IV PUSH (FOR BLOOD PRESSURE SUPPORT)
PREFILLED_SYRINGE | INTRAVENOUS | Status: AC
  Filled 2015-02-15: qty 10

## 2015-02-15 MED ORDER — METOCLOPRAMIDE HCL 5 MG PO TABS
5.0000 mg | ORAL_TABLET | Freq: Three times a day (TID) | ORAL | Status: DC | PRN
Start: 2015-02-15 — End: 2015-02-17

## 2015-02-15 MED ORDER — TIOTROPIUM BROMIDE MONOHYDRATE 18 MCG IN CAPS
18.0000 ug | ORAL_CAPSULE | Freq: Every day | RESPIRATORY_TRACT | Status: DC
  Administered 2015-02-15 – 2015-02-17 (×3): 18 ug via RESPIRATORY_TRACT
  Filled 2015-02-15: qty 5

## 2015-02-15 MED ORDER — BUPIVACAINE-EPINEPHRINE 0.5% -1:200000 IJ SOLN
INTRAMUSCULAR | Status: DC | PRN
  Administered 2015-02-15: 30 mL

## 2015-02-15 MED ORDER — MIDAZOLAM HCL 2 MG/2ML IJ SOLN
INTRAMUSCULAR | Status: AC
  Filled 2015-02-15: qty 4

## 2015-02-15 MED ORDER — BUPIVACAINE-EPINEPHRINE (PF) 0.5% -1:200000 IJ SOLN
INTRAMUSCULAR | Status: DC | PRN
  Administered 2015-02-15: 30 mL via PERINEURAL

## 2015-02-15 MED ORDER — METHOCARBAMOL 500 MG PO TABS
500.0000 mg | ORAL_TABLET | Freq: Four times a day (QID) | ORAL | Status: DC | PRN
  Administered 2015-02-15 – 2015-02-17 (×6): 500 mg via ORAL
  Filled 2015-02-15 (×6): qty 1

## 2015-02-15 MED ORDER — HYDROMORPHONE HCL 1 MG/ML IJ SOLN: 0.2500 mg | INTRAMUSCULAR | Status: DC | PRN

## 2015-02-15 MED ORDER — BUPIVACAINE IN DEXTROSE 0.75-8.25 % IT SOLN
INTRATHECAL | Status: DC | PRN
  Administered 2015-02-15: 2 mL via INTRATHECAL

## 2015-02-15 MED ORDER — ONDANSETRON HCL 4 MG/2ML IJ SOLN
INTRAMUSCULAR | Status: DC | PRN
  Administered 2015-02-15: 4 mg via INTRAVENOUS

## 2015-02-15 MED ORDER — NITROGLYCERIN 0.4 MG SL SUBL: 0.4000 mg | SUBLINGUAL_TABLET | SUBLINGUAL | Status: DC | PRN

## 2015-02-15 MED ORDER — HYDROMORPHONE HCL 1 MG/ML IJ SOLN
1.0000 mg | INTRAMUSCULAR | Status: DC | PRN
  Administered 2015-02-15 – 2015-02-16 (×2): 1 mg via INTRAVENOUS
  Filled 2015-02-15 (×2): qty 1

## 2015-02-15 MED ORDER — OXYCODONE HCL 5 MG PO TABS
5.0000 mg | ORAL_TABLET | ORAL | Status: DC | PRN
  Administered 2015-02-15: 5 mg via ORAL
  Administered 2015-02-15 – 2015-02-17 (×9): 10 mg via ORAL
  Filled 2015-02-15 (×6): qty 2
  Filled 2015-02-15: qty 1
  Filled 2015-02-15 (×2): qty 2
  Filled 2015-02-15: qty 1
  Filled 2015-02-15: qty 2

## 2015-02-15 MED ORDER — FUROSEMIDE 40 MG PO TABS: 40.0000 mg | ORAL_TABLET | Freq: Every day | ORAL | Status: DC | PRN

## 2015-02-15 MED ORDER — CELECOXIB 200 MG PO CAPS
200.0000 mg | ORAL_CAPSULE | Freq: Two times a day (BID) | ORAL | Status: DC
  Administered 2015-02-15 – 2015-02-17 (×4): 200 mg via ORAL
  Filled 2015-02-15 (×5): qty 1

## 2015-02-15 MED ORDER — PROMETHAZINE HCL 25 MG/ML IJ SOLN: 6.2500 mg | INTRAMUSCULAR | Status: DC | PRN

## 2015-02-15 MED ORDER — OXYCODONE HCL ER 10 MG PO T12A
10.0000 mg | EXTENDED_RELEASE_TABLET | Freq: Two times a day (BID) | ORAL | Status: DC
  Administered 2015-02-15 – 2015-02-17 (×4): 10 mg via ORAL
  Filled 2015-02-15 (×4): qty 1

## 2015-02-15 MED ORDER — ACETAMINOPHEN 650 MG RE SUPP: 650.0000 mg | Freq: Four times a day (QID) | RECTAL | Status: DC | PRN

## 2015-02-15 MED ORDER — BISACODYL 5 MG PO TBEC: 5.0000 mg | DELAYED_RELEASE_TABLET | Freq: Every day | ORAL | Status: DC | PRN

## 2015-02-15 MED ORDER — METHOCARBAMOL 1000 MG/10ML IJ SOLN: 500.0000 mg | Freq: Four times a day (QID) | INTRAVENOUS | Status: DC | PRN

## 2015-02-15 MED ORDER — MIDAZOLAM HCL 5 MG/5ML IJ SOLN
INTRAMUSCULAR | Status: DC | PRN
  Administered 2015-02-15: 2 mg via INTRAVENOUS

## 2015-02-15 MED ORDER — SODIUM CHLORIDE 0.9 % IV SOLN
INTRAVENOUS | Status: DC
  Administered 2015-02-15: 16:00:00 via INTRAVENOUS

## 2015-02-15 MED ORDER — ACETAMINOPHEN 325 MG PO TABS: 650.0000 mg | ORAL_TABLET | Freq: Four times a day (QID) | ORAL | Status: DC | PRN

## 2015-02-15 MED ORDER — IPRATROPIUM BROMIDE 0.02 % IN SOLN: 0.5000 mg | Freq: Three times a day (TID) | RESPIRATORY_TRACT | Status: DC | PRN

## 2015-02-15 MED ORDER — CEFAZOLIN SODIUM-DEXTROSE 2-3 GM-% IV SOLR
2.0000 g | INTRAVENOUS | Status: AC
  Administered 2015-02-15: 2 g via INTRAVENOUS
  Filled 2015-02-15: qty 50

## 2015-02-15 MED ORDER — TRAZODONE HCL 100 MG PO TABS
100.0000 mg | ORAL_TABLET | Freq: Every day | ORAL | Status: DC
  Administered 2015-02-15 – 2015-02-16 (×2): 100 mg via ORAL
  Filled 2015-02-15 (×2): qty 1

## 2015-02-15 SURGICAL SUPPLY — 58 items
BANDAGE ELASTIC 6 VELCRO ST LF (GAUZE/BANDAGES/DRESSINGS) ×3 IMPLANT
BANDAGE ESMARK 6X9 LF (GAUZE/BANDAGES/DRESSINGS) ×1 IMPLANT
BLADE SAGITTAL 13X1.27X60 (BLADE) ×2 IMPLANT
BLADE SAGITTAL 13X1.27X60MM (BLADE) ×1
BLADE SAW SGTL 83.5X18.5 (BLADE) ×3 IMPLANT
BLADE SURG 10 STRL SS (BLADE) ×3 IMPLANT
BNDG ESMARK 6X9 LF (GAUZE/BANDAGES/DRESSINGS) ×3
BOWL SMART MIX CTS (DISPOSABLE) ×3 IMPLANT
CAPT KNEE TOTAL 3 ×3 IMPLANT
CEMENT BONE SIMPLEX SPEEDSET (Cement) ×6 IMPLANT
COVER SURGICAL LIGHT HANDLE (MISCELLANEOUS) ×3 IMPLANT
CUFF TOURNIQUET SINGLE 34IN LL (TOURNIQUET CUFF) ×3 IMPLANT
DRAPE EXTREMITY T 121X128X90 (DRAPE) ×3 IMPLANT
DRAPE INCISE IOBAN 66X45 STRL (DRAPES) ×6 IMPLANT
DRAPE PROXIMA HALF (DRAPES) IMPLANT
DRAPE U-SHAPE 47X51 STRL (DRAPES) ×3 IMPLANT
DRSG ADAPTIC 3X8 NADH LF (GAUZE/BANDAGES/DRESSINGS) ×3 IMPLANT
DRSG PAD ABDOMINAL 8X10 ST (GAUZE/BANDAGES/DRESSINGS) ×6 IMPLANT
DURAPREP 26ML APPLICATOR (WOUND CARE) ×6 IMPLANT
ELECT REM PT RETURN 9FT ADLT (ELECTROSURGICAL) ×3
ELECTRODE REM PT RTRN 9FT ADLT (ELECTROSURGICAL) ×1 IMPLANT
GAUZE SPONGE 4X4 12PLY STRL (GAUZE/BANDAGES/DRESSINGS) ×3 IMPLANT
GLOVE BIOGEL M 7.0 STRL (GLOVE) IMPLANT
GLOVE BIOGEL PI IND STRL 7.5 (GLOVE) IMPLANT
GLOVE BIOGEL PI IND STRL 8.5 (GLOVE) ×2 IMPLANT
GLOVE BIOGEL PI INDICATOR 7.5 (GLOVE)
GLOVE BIOGEL PI INDICATOR 8.5 (GLOVE) ×4
GLOVE SURG ORTHO 8.0 STRL STRW (GLOVE) ×6 IMPLANT
GOWN STRL REUS W/ TWL LRG LVL3 (GOWN DISPOSABLE) ×1 IMPLANT
GOWN STRL REUS W/ TWL XL LVL3 (GOWN DISPOSABLE) ×2 IMPLANT
GOWN STRL REUS W/TWL LRG LVL3 (GOWN DISPOSABLE) ×2
GOWN STRL REUS W/TWL XL LVL3 (GOWN DISPOSABLE) ×4
HANDPIECE INTERPULSE COAX TIP (DISPOSABLE) ×2
HOOD PEEL AWAY FACE SHEILD DIS (HOOD) ×9 IMPLANT
KIT BASIN OR (CUSTOM PROCEDURE TRAY) ×3 IMPLANT
KIT ROOM TURNOVER OR (KITS) ×3 IMPLANT
KNEE CAPITATED TOTAL 3 ×1 IMPLANT
MANIFOLD NEPTUNE II (INSTRUMENTS) ×3 IMPLANT
NEEDLE 22X1 1/2 (OR ONLY) (NEEDLE) ×6 IMPLANT
NS IRRIG 1000ML POUR BTL (IV SOLUTION) ×3 IMPLANT
PACK TOTAL JOINT (CUSTOM PROCEDURE TRAY) ×3 IMPLANT
PACK UNIVERSAL I (CUSTOM PROCEDURE TRAY) ×3 IMPLANT
PAD ARMBOARD 7.5X6 YLW CONV (MISCELLANEOUS) ×6 IMPLANT
PADDING CAST COTTON 6X4 STRL (CAST SUPPLIES) ×3 IMPLANT
SET HNDPC FAN SPRY TIP SCT (DISPOSABLE) ×1 IMPLANT
SPONGE GAUZE 4X4 12PLY STER LF (GAUZE/BANDAGES/DRESSINGS) ×3 IMPLANT
STAPLER VISISTAT 35W (STAPLE) ×3 IMPLANT
SUCTION FRAZIER TIP 10 FR DISP (SUCTIONS) ×3 IMPLANT
SUT BONE WAX W31G (SUTURE) ×3 IMPLANT
SUT VIC AB 0 CTB1 27 (SUTURE) ×6 IMPLANT
SUT VIC AB 1 CT1 27 (SUTURE) ×4
SUT VIC AB 1 CT1 27XBRD ANBCTR (SUTURE) ×2 IMPLANT
SUT VIC AB 2-0 CT1 27 (SUTURE) ×4
SUT VIC AB 2-0 CT1 TAPERPNT 27 (SUTURE) ×2 IMPLANT
SYR 20CC LL (SYRINGE) ×6 IMPLANT
TOWEL OR 17X24 6PK STRL BLUE (TOWEL DISPOSABLE) ×3 IMPLANT
TOWEL OR 17X26 10 PK STRL BLUE (TOWEL DISPOSABLE) ×3 IMPLANT
WATER STERILE IRR 1000ML POUR (IV SOLUTION) ×6 IMPLANT

## 2015-02-15 NOTE — Op Note (Signed)
TOTAL KNEE REPLACEMENT OPERATIVE NOTE:  02/15/2015  1:44 PM  PATIENT:  [name] [name]  69 y.o. female  PRE-OPERATIVE DIAGNOSIS:  primary osteoarthritis left knee  POST-OPERATIVE DIAGNOSIS:  primary osteoarthritis left knee  PROCEDURE:  Procedure(s): TOTAL KNEE ARTHROPLASTY  SURGEON:  Surgeon(s): Vickey Huger, MD  PHYSICIAN ASSISTANT: Carlynn Spry, [facility]  ANESTHESIA:   spinal  DRAINS: Hemovac  SPECIMEN: None  COUNTS:  Correct  TOURNIQUET:   Total Tourniquet Time Documented: Thigh (Left) - 58 minutes Total: Thigh (Left) - 58 minutes   DICTATION:  Indication for procedure:    The patient is a 69 y.o. female who has failed conservative treatment for primary osteoarthritis left knee.  Informed consent was obtained prior to anesthesia. The risks versus benefits of the operation were explain and in a way the patient can, and did, understand.   On the implant demand matching protocol, this patient scored 8.  Therefore, this patient did" "did not receive a polyethylene insert with vitamin E which is a high demand implant.  Description of procedure:     The patient was taken to the operating room and placed under anesthesia.  The patient was positioned in the usual fashion taking care that all body parts were adequately padded and/or protected.  I foley catheter was not placed.  A tourniquet was applied and the leg prepped and draped in the usual sterile fashion.  The extremity was exsanguinated with the esmarch and tourniquet inflated to 350 mmHg.  Pre-operative range of motion was normal.  The knee was in 8 degree of significant valgus.  A midline incision approximately 6-7 inches long was made with a #10 blade.  A new blade was used to make a parapatellar arthrotomy going 2-3 cm into the quadriceps tendon, over the patella, and alongside the medial aspect of the patellar tendon.  A synovectomy was then performed with the #10 blade and forceps. I then elevated the deep MCL off the  medial tibial metaphysis subperiosteally around to the semimembranosus attachment.    She had a prior patellectomy so no patella resurfacing was performed.  A homan retractor was place to retract and protect the patella and lateral structures.  A Z-retractor was place medially to protect the medial structures.  The extra-medullary alignment system was used to make cut the tibial articular surface perpendicular to the anamotic axis of the tibia and in 3 degrees of posterior slope.  The cut surface and alignment jig was removed.  I then used the intramedullary alignment guide to make a 4 valgus cut on the distal femur.  I then marked out the epicondylar axis on the distal femur.  The posterior condylar axis measured 5 degrees.  I then used the anterior referencing sizer and measured the femur to be a size 7.  The 4-In-1 cutting block was screwed into place in external rotation matching the posterior condylar angle, making our cuts perpendicular to the epicondylar axis.  Anterior, posterior and chamfer cuts were made with the sagittal saw.  The cutting block and cut pieces were removed.  A lamina spreader was placed in 90 degrees of flexion.  The ACL, PCL, menisci, and posterior condylar osteophytes were removed.  A 12 mm spacer blocked was found to offer good flexion and extension gap balance after marked in degree releasing.   The scoop retractor was then placed and the femoral finishing block was pinned in place.  The small sagittal saw was used as well as the lug drill to finish the femur.  The block and cut surfaces were removed and the medullary canal hole filled with autograft bone from the cut pieces.  The tibia was delivered forward in deep flexion and external rotation.  A size D tray was selected and pinned into place centered on the medial 1/3 of the tibial tubercle.  The reamer and keel was used to prepare the tibia through the tray.    I then trialed with the size 7 femur, size D tibia, a 12  mm insert. I had excellent flexion/extension gap balanc  Flexion was full and beyond 120 degrees; extension was zero.  These components were chosen and the staff opened them to me on the back table while the knee was lavaged copiously and the cement mixed.  The soft tissue was infiltrated with 60cc of exparel 1.3% through a 21 gauge needle.  I cemented in the components and removed all excess cement.  The polyethylene tibial component was snapped into place and the knee placed in extension while cement was hardening.  The capsule was infilltrated with 30cc of .25% Marcaine with epinephrine.  A hemovac was place in the joint exiting superolaterally.  A pain pump was place superomedially superficial to the arthrotomy.  Once the cement was hard, the tourniquet was let down.  Hemostasis was obtained.  The arthrotomy was closed with figure-8 #1 vicryl sutures.  The deep soft tissues were closed with #0 vicryls and the subcuticular layer closed with a running #2-0 vicryl.  The skin was reapproximated and closed with skin staples.  The wound was dressed with xeroform, 4 x4's, 2 ABD sponges, a single layer of webril and a TED stocking.   The patient was then awakened, extubated, and taken to the recovery room in stable condition.  BLOOD LOSS:  300cc DRAINS: 1 hemovac, 1 pain catheter COMPLICATIONS:  None.  PLAN OF CARE: Admit to inpatient   PATIENT DISPOSITION:  PACU - hemodynamically stable.   Delay start of Pharmacological VTE agent (>24hrs) due to surgical blood loss or risk of bleeding:  not applicable  Please fax a copy of this op note to my office at [phone] (please only include page 1 and 2 of the Case Information op note)

## 2015-02-15 NOTE — Anesthesia Procedure Notes (Signed)
Anesthesia Regional Block:  Adductor canal block  Pre-Anesthetic Checklist: ,, timeout performed, Correct Patient, Correct Site, Correct Laterality, Correct Procedure, Correct Position, site marked, Risks and benefits discussed, Surgical consent,  Pre-op evaluation,  Post-op pain management  Laterality: Left  Prep: chloraprep       Needles:  Injection technique: Single-shot  Needle Type: Stimiplex     Needle Length: 9cm 9 cm Needle Gauge: 21 and 21 G    Additional Needles:  Procedures: ultrasound guided (picture in chart) Adductor canal block Narrative:  Injection made incrementally with aspirations every 5 mL.  Performed by: Personally  Anesthesiologist: Nolon Nations  Additional Notes: BP cuff, EKG monitors applied. Sedation begun. Artery and nerve location verified with U/S and anesthetic injected incrementally, slowly, and after negative aspirations under direct u/s guidance. Good fascial /perineural spread. Tolerated well.   Spinal Patient location during procedure: OR Staffing Anesthesiologist: Nolon Nations Performed by: anesthesiologist  Preanesthetic Checklist Completed: patient identified, site marked, surgical consent, pre-op evaluation, timeout performed, IV checked, risks and benefits discussed and monitors and equipment checked Spinal Block Patient position: sitting Prep: ChloraPrep Patient monitoring: heart rate, continuous pulse ox and blood pressure Approach: right paramedian Location: L2-3 Injection technique: single-shot Needle Needle type: Sprotte  Needle gauge: 24 G Needle length: 9 cm Additional Notes Expiration date of kit checked and confirmed. Patient tolerated procedure well, without complications.

## 2015-02-15 NOTE — Anesthesia Postprocedure Evaluation (Signed)
Anesthesia Post Note  Patient: [name] [name]  Procedure(s) Performed: Procedure(s) (LRB): TOTAL KNEE ARTHROPLASTY (Left)  Anesthesia type: Spinal + ACB  Patient location: PACU  Post pain: Pain level controlled  Post assessment: Post-op Vital signs reviewed  Last Vitals: BP 117/50 mmHg  Pulse 69  Temp(Src) 36.5 C (Oral)  Resp 16  Ht 5\' 3"  (1.6 m)  Wt 163 lb 7 oz (74.135 kg)  BMI 28.96 kg/m2  SpO2 97%  Post vital signs: Reviewed  Level of consciousness: sedated  Complications: No apparent anesthesia complications

## 2015-02-15 NOTE — H&P (Signed)
[name] [name] MRN:  [id number] DOB/SEX:  08-26-1945/female  CHIEF COMPLAINT:  Painful left Knee  HISTORY: Patient is a 69 y.o. female presented with a history of pain in the left knee. Onset of symptoms was gradual starting several years ago with gradually worsening course since that time. Prior procedures on the knee include none. Patient has been treated conservatively with over-the-counter NSAIDs and activity modification. Patient currently rates pain in the knee at 10 out of 10 with activity. There is pain at night.  PAST MEDICAL HISTORY: There are no active problems to display for this patient.  Past Medical History  Diagnosis Date  . Arthritis   . Diabetes mellitus without complication   . Rash   . Sleep trouble   . Frequent headaches   . Joint pain   . COPD (chronic obstructive pulmonary disease)   . SOB (shortness of breath)   . Dyspnea on exertion   . Eye problem   . Weakness   . Insomnia   . Bowel trouble   . Colon disorder   . IBS (irritable bowel syndrome)   . Heart disease   . Hypertension   . Hyperlipidemia   . Fibromyalgia   . Enlarged aorta   . Asthma   . Depression   . GERD (gastroesophageal reflux disease)   . Myocardial infarction     mild-heart attack  pt.living in [place] at the time   Past Surgical History  Procedure Laterality Date  . Cholecystectomy    . Abdominal hysterectomy    . Wrist surgery      right  . Knee surgery      left  . Tubal ligation    . Hemorrhoid surgery       MEDICATIONS:   Prescriptions prior to admission  Medication Sig Dispense Refill Last Dose  . albuterol (PROVENTIL HFA;VENTOLIN HFA) 108 (90 BASE) MCG/ACT inhaler Inhale into the lungs every 6 (six) hours as needed for wheezing or shortness of breath.   Past Week at Unknown time  . buPROPion (WELLBUTRIN XL) 150 MG 24 hr tablet Take 150 mg by mouth daily.   Past Week at Unknown time  . celecoxib (CELEBREX) 200 MG capsule Take 200 mg by mouth daily.   Past Week at  Unknown time  . cetirizine (ZYRTEC) 10 MG tablet Take 10 mg by mouth daily.   02/14/2015 at Unknown time  . furosemide (LASIX) 40 MG tablet Take 40 mg by mouth daily as needed for edema.    Past Month at Unknown time  . ipratropium (ATROVENT) 0.02 % nebulizer solution Take 0.5 mg by nebulization every 8 (eight) hours as needed for wheezing or shortness of breath.   Past Week at Unknown time  . lisinopril-hydrochlorothiazide (PRINZIDE,ZESTORETIC) 20-12.5 MG per tablet Take 2 tablets by mouth daily.   02/14/2015 at Unknown time  . lovastatin (MEVACOR) 40 MG tablet Take 40 mg by mouth at bedtime.   02/14/2015 at Unknown time  . metFORMIN (GLUCOPHAGE) 500 MG tablet Take 500 mg by mouth at bedtime.    02/14/2015 at Unknown time  . montelukast (SINGULAIR) 10 MG tablet Take 10 mg by mouth at bedtime.   02/14/2015 at Unknown time  . omeprazole (PRILOSEC) 40 MG capsule Take 40 mg by mouth daily as needed.    Past Week at Unknown time  . tiotropium (SPIRIVA) 18 MCG inhalation capsule Place 18 mcg into inhaler and inhale daily as needed.    Past Week at Unknown time  . traZODone (DESYREL)  100 MG tablet Take 100 mg by mouth at bedtime.   Past Week at Unknown time  . Milnacipran (SAVELLA) 50 MG TABS tablet Take 50 mg by mouth 2 (two) times daily.   Unknown at Unknown time  . nitroGLYCERIN (NITROSTAT) 0.4 MG SL tablet Place 0.4 mg under the tongue every 5 (five) minutes as needed for chest pain.   Unknown at Unknown time    ALLERGIES:   Allergies  Allergen Reactions  . Demerol [Meperidine]     Blood pressure dropped  . Influenza Vaccines     Flu like symptoms    REVIEW OF SYSTEMS:  A comprehensive review of systems was negative.   FAMILY HISTORY:  History reviewed. No pertinent family history.  SOCIAL HISTORY:   Social History  Substance Use Topics  . Smoking status: Current Every Day Smoker -- 1.50 packs/day for 47 years    Types: Cigarettes  . Smokeless tobacco: Never Used  . Alcohol Use: No      EXAMINATION:  Vital signs in last 24 hours: Temp:  [98.3 F (36.8 C)] 98.3 F (36.8 C) (09/19 0617) Pulse Rate:  [74] 74 (09/19 0617) Resp:  [20] 20 (09/19 0617) BP: (128)/(62) 128/62 mmHg (09/19 0617) SpO2:  [98 %] 98 % (09/19 0617) Weight:  [74.135 kg (163 lb 7 oz)] 74.135 kg (163 lb 7 oz) (09/19 0617)  General appearance: alert, cooperative and no distress Lungs: clear to auscultation bilaterally Heart: regular rate and rhythm, S1, S2 normal, no murmur, click, rub or gallop Abdomen: soft, non-tender; bowel sounds normal; no masses,  no organomegaly Extremities: extremities normal, atraumatic, no cyanosis or edema and Homans sign is negative, no sign of DVT Pulses: 2+ and symmetric Skin: Skin color, texture, turgor normal. No rashes or lesions Neurologic: Alert and oriented X 3, normal strength and tone. Normal symmetric reflexes. Normal coordination and gait  Musculoskeletal:  ROM 0-110, Ligaments intact,  Imaging Review Plain radiographs demonstrate severe degenerative joint disease of the left knee. The overall alignment is significant varus. The bone quality appears to be good for age and reported activity level.  Assessment/Plan: Primary osteoarthritis, left knee   The patient history, physical examination and imaging studies are consistent with advanced degenerative joint disease of the left knee. The patient has failed conservative treatment.  The clearance notes were reviewed.  After discussion with the patient it was felt that Total Knee Replacement was indicated. The procedure,  risks, and benefits of total knee arthroplasty were presented and reviewed. The risks including but not limited to aseptic loosening, infection, blood clots, vascular injury, stiffness, patella tracking problems complications among others were discussed. The patient acknowledged the explanation, agreed to proceed with the plan.  [name] [name] 02/15/2015, 6:43 AM

## 2015-02-15 NOTE — Evaluation (Signed)
Physical Therapy Evaluation Patient Details Name: [name] [name] MRN: [id number] DOB: 1946-03-20 Today's Date: 02/15/2015   History of Present Illness  Patient is a 69 y/o female s/p L TKA. PMH includes COPD, HTN, HLD, MI.  Clinical Impression  Patient presents with pain, nausea and post surgical deficits LLE s/p Lt TKA. Tolerated SPT to [facility] with min A for balance/safety. + dry heaving and nausea throughout session. Pt adamant about returning home and not willing to go to [facility]. Pt reports her granddaughter took 3 days off from work to assist at d/c and she has supportive neighbor. Instructed pt in exercises. Will follow acutely to maximize independence and mobility prior to return home.    Follow Up Recommendations Home health PT;Supervision/Assistance - 24 hour    Equipment Recommendations  None recommended by PT    Recommendations for Other Services OT consult     Precautions / Restrictions Precautions Precautions: Knee Precaution Booklet Issued: No Precaution Comments: Reviewed no pllow under knee and precautions. Restrictions Weight Bearing Restrictions: Yes LLE Weight Bearing: Weight bearing as tolerated      Mobility  Bed Mobility Overal bed mobility: Needs Assistance Bed Mobility: Supine to Sit     Supine to sit: Min assist;HOB elevated     General bed mobility comments: Min A to bring LLE to EOB. Increased time. +  nausea.  Transfers Overall transfer level: Needs assistance Equipment used: Rolling walker (2 wheeled) Transfers: Sit to/from [facility] Sit to Stand: Mod assist Stand pivot transfers: Min assist       General transfer comment: Mod A to boost from EOb with cues for hand placement and technique. SPT bed to [facility] Min A. Left knee instability. + nausea.  Ambulation/Gait                Stairs            Wheelchair Mobility    Modified Rankin (Stroke Patients Only)       Balance Overall balance assessment: Needs  assistance Sitting-balance support: Feet supported;No upper extremity supported Sitting balance-Leahy Scale: Fair     Standing balance support: During functional activity Standing balance-Leahy Scale: Poor Standing balance comment: Relient on RW for support.                              Pertinent Vitals/Pain Pain Assessment: 0-10 Pain Score: 9  Pain Location: left knee Pain Descriptors / Indicators: Sore;Aching Pain Intervention(s): Monitored during session;Repositioned;Premedicated before session;Limited activity within patient's tolerance    Home Living Family/patient expects to be discharged to:: Private residence Living Arrangements: Alone Available Help at Discharge: Family;Available PRN/intermittently Type of Home: House Home Access: Level entry     Home Layout: One level Home Equipment: Walker - 2 wheels;Bedside commode;Cane - quad;Electric scooter      Prior Function Level of Independence: Independent with assistive device(s)         Comments: Pt using quad cane for ambulation PTA. Using motorized w/c for community distances. Does not drive. Granddaiughter assists with grocery shopping and neighbor.     Hand Dominance        Extremity/Trunk Assessment   Upper Extremity Assessment: Defer to OT evaluation           Lower Extremity Assessment: LLE deficits/detail   LLE Deficits / Details: Limited AROM/strength secondary to pain/surgery.     Communication   Communication: No difficulties  Cognition Arousal/Alertness: Awake/alert Behavior During Therapy: [facility]  for tasks assessed/performed Overall Cognitive Status: Within Functional Limits for tasks assessed                      General Comments      Exercises Total Joint Exercises Ankle Circles/Pumps: Both;10 reps;Supine Quad Sets: Both;10 reps;Supine      Assessment/Plan    PT Assessment Patient needs continued PT services  PT Diagnosis Acute pain;Generalized  weakness;Difficulty walking   PT Problem List Decreased strength;Pain;Decreased range of motion;Decreased activity tolerance;Decreased balance;Decreased mobility;Impaired sensation  PT Treatment Interventions Balance training;Gait training;Therapeutic activities;Therapeutic exercise;Functional mobility training;Patient/family education   PT Goals (Current goals can be found in the Care Plan section) Acute Rehab PT Goals Patient Stated Goal: to go home PT Goal Formulation: With patient Time For Goal Achievement: 03/01/15 Potential to Achieve Goals: Fair    Frequency 7X/week   Barriers to discharge Decreased caregiver support      Co-evaluation               End of Session Equipment Utilized During Treatment: Gait belt;Oxygen Activity Tolerance: Other (comment);Patient tolerated treatment well;Patient limited by pain (nausea.) Patient left: in bed;with call bell/phone within reach Nurse Communication: Mobility status;Other (comment) (Rn notified pt left sitting in [facility] and will call when ready to return to bed.)         Time: 9509-3267 PT Time Calculation (min) (ACUTE ONLY): 32 min   Charges:   PT Evaluation $Initial PT Evaluation Tier I: 1 Procedure PT Treatments $Therapeutic Activity: 8-22 mins   PT G Codes:        Shauna A Hartshorne 02/15/2015, 5:16 PM  Wray Kearns, [place], DPT [phone]

## 2015-02-15 NOTE — Transfer of Care (Signed)
Immediate Anesthesia Transfer of Care Note  Patient: [name] [name]  Procedure(s) Performed: Procedure(s): TOTAL KNEE ARTHROPLASTY (Left)  Patient Location: PACU  Anesthesia Type:MAC  Level of Consciousness: awake, alert  and oriented  Airway & Oxygen Therapy: Patient Spontanous Breathing and Patient connected to nasal cannula oxygen  Post-op Assessment: Report given to RN and Post -op Vital signs reviewed and stable  Post vital signs: Reviewed and stable  Last Vitals:  Filed Vitals:   02/15/15 0617  BP: 128/62  Pulse: 74  Temp: 36.8 C  Resp: 20    Complications: No apparent anesthesia complications

## 2015-02-15 NOTE — Progress Notes (Signed)
Utilization review completed.  

## 2015-02-15 NOTE — Progress Notes (Signed)
Orthopedic Tech Progress Note Patient Details:  Bradee Common 09/22/1945 [id number]  CPM Left Knee CPM Left Knee: On Left Knee Flexion (Degrees): 90 Left Knee Extension (Degrees): 0 Additional Comments: Trapeze bar and foot roll   Irish Elders 02/15/2015, 10:22 AM

## 2015-02-16 ENCOUNTER — Encounter (HOSPITAL_COMMUNITY): Payer: Self-pay | Admitting: Orthopedic Surgery

## 2015-02-16 HISTORY — PX: TOTAL KNEE ARTHROPLASTY: SHX125

## 2015-02-16 LAB — GLUCOSE, CAPILLARY
GLUCOSE-CAPILLARY: 116 mg/dL — AB (ref 65–99)
GLUCOSE-CAPILLARY: 124 mg/dL — AB (ref 65–99)
Glucose-Capillary: 151 mg/dL — ABNORMAL HIGH (ref 65–99)

## 2015-02-16 LAB — HEMOGLOBIN A1C
Hgb A1c MFr Bld: 5.6 % (ref 4.8–5.6)
MEAN PLASMA GLUCOSE: 114 mg/dL

## 2015-02-16 LAB — BASIC METABOLIC PANEL
ANION GAP: 6 (ref 5–15)
BUN: 7 mg/dL (ref 6–20)
CALCIUM: 8.3 mg/dL — AB (ref 8.9–10.3)
CO2: 26 mmol/L (ref 22–32)
Chloride: 99 mmol/L — ABNORMAL LOW (ref 101–111)
Creatinine, Ser: 0.55 mg/dL (ref 0.44–1.00)
GFR calc Af Amer: >60 mL/min (ref >60)
Glucose, Bld: 118 mg/dL — ABNORMAL HIGH (ref 65–99)
POTASSIUM: 3.8 mmol/L (ref 3.5–5.1)
SODIUM: 131 mmol/L — AB (ref 135–145)

## 2015-02-16 LAB — CBC
HEMATOCRIT: 36.1 % (ref 36.0–46.0)
Hemoglobin: 12 g/dL (ref 12.0–15.0)
MCH: 31.4 pg (ref 26.0–34.0)
MCHC: 33.2 g/dL (ref 30.0–36.0)
MCV: 94.5 fL (ref 78.0–100.0)
Platelets: 144 10*3/uL — ABNORMAL LOW (ref 150–400)
RBC: 3.82 MIL/uL — ABNORMAL LOW (ref 3.87–5.11)
RDW: 13.9 % (ref 11.5–15.5)
WBC: 8.4 10*3/uL (ref 4.0–10.5)

## 2015-02-16 MED ORDER — METHOCARBAMOL 500 MG PO TABS: 500.0000 mg | ORAL_TABLET | Freq: Four times a day (QID) | ORAL | Status: DC | PRN

## 2015-02-16 MED ORDER — OXYCODONE HCL ER 10 MG PO T12A: 10.0000 mg | EXTENDED_RELEASE_TABLET | Freq: Two times a day (BID) | ORAL | Status: DC

## 2015-02-16 MED ORDER — OXYCODONE HCL 5 MG PO TABS: 5.0000 mg | ORAL_TABLET | ORAL | Status: DC | PRN

## 2015-02-16 MED ORDER — METOCLOPRAMIDE HCL 5 MG PO TABS: 5.0000 mg | ORAL_TABLET | Freq: Three times a day (TID) | ORAL | Status: DC | PRN

## 2015-02-16 MED ORDER — ENOXAPARIN SODIUM 40 MG/0.4ML ~~LOC~~ SOLN: 40.0000 mg | SUBCUTANEOUS | Status: DC

## 2015-02-16 NOTE — Progress Notes (Signed)
Physical Therapy Treatment Patient Details Name: [name] [name] MRN: [id number] DOB: May 28, 1946 Today's Date: 02/16/2015    History of Present Illness Patient is a 69 y/o female s/p L TKA. PMH includes COPD, HTN, HLD, MI.    PT Comments    Patient progressing slowly towards PT goals. Increased time and effort to perform mobility. Tolerated short distance ambulation to bathroom and back. Instructed pt in exercises. Will focus on gait training next session to prepare pt for home. Will follow acutely per current POC.   Follow Up Recommendations  Home health PT;Supervision/Assistance - 24 hour     Equipment Recommendations  None recommended by PT    Recommendations for Other Services       Precautions / Restrictions Precautions Precautions: Knee Precaution Booklet Issued: Yes (comment) Precaution Comments: Reviewed no pllow under knee and precautions. Restrictions Weight Bearing Restrictions: Yes LLE Weight Bearing: Weight bearing as tolerated    Mobility  Bed Mobility Overal bed mobility: Needs Assistance Bed Mobility: Supine to Sit;Sit to Supine     Supine to sit: Min guard Sit to supine: Min guard   General bed mobility comments: HOB flat, no use of rails to simulate home. Cues for technique and to use RLE to bring LLE to EOB.   Transfers Overall transfer level: Needs assistance Equipment used: Rolling walker (2 wheeled) Transfers: Sit to/from Stand Sit to Stand: Min guard         General transfer comment: Min guard for safety. Cues for hand placement. Stood fromE OB x1, from toilet x1.   Ambulation/Gait Ambulation/Gait assistance: Min guard Ambulation Distance (Feet): 20 Feet (x2 bouts) Assistive device: Rolling walker (2 wheeled) Gait Pattern/deviations: Decreased stance time - left;Decreased step length - right;Trunk flexed;Decreased weight shift to left   Gait velocity interpretation: <1.8 ft/sec, indicative of risk for recurrent falls General Gait Details:  Cues for step through gait however pt with increased pain with WB.    Stairs            Wheelchair Mobility    Modified Rankin (Stroke Patients Only)       Balance Overall balance assessment: Needs assistance Sitting-balance support: Feet supported;No upper extremity supported Sitting balance-Leahy Scale: Fair     Standing balance support: During functional activity Standing balance-Leahy Scale: Fair Standing balance comment: Tolerated hand washing at sink without UE support for short period.                    Cognition Arousal/Alertness: Awake/alert Behavior During Therapy: WFL for tasks assessed/performed Overall Cognitive Status: Within Functional Limits for tasks assessed                      Exercises Total Joint Exercises Short Arc QuadSinclair Ship;Left;10 reps;Supine Long Arc Quad: AAROM;Left;10 reps;Seated    General Comments        Pertinent Vitals/Pain Pain Assessment: Faces Faces Pain Scale: Hurts even more Pain Location: left knee Pain Descriptors / Indicators: Sore;Aching Pain Intervention(s): Monitored during session;Repositioned;Limited activity within patient's tolerance    Home Living                      Prior Function            PT Goals (current goals can now be found in the care plan section) Progress towards PT goals: Progressing toward goals (slowly)    Frequency  7X/week    PT Plan Current plan remains appropriate    Co-evaluation  End of Session Equipment Utilized During Treatment: Gait belt Activity Tolerance: Patient limited by pain;Patient tolerated treatment well Patient left: in bed;with call bell/phone within reach;Other (comment) (in zero degree knee)     Time: 5631-4970 PT Time Calculation (min) (ACUTE ONLY): 20 min  Charges:  $Therapeutic Activity: 8-22 mins                    G Codes:      Shauna A Hartshorne 02/16/2015, 3:36 PM Wray Kearns, [place],  DPT [phone]

## 2015-02-16 NOTE — Evaluation (Signed)
Occupational Therapy Evaluation Patient Details Name: [name] [name] MRN: [id number] DOB: [birth year] Today's Date: 02/16/2015    History of Present Illness Patient is a 69 y/o female s/p L TKA. PMH includes COPD, HTN, HLD, MI.   Clinical Impression   Pt reports she was independent in ADLs PTA. Currently pt requires min assist for functional transfers (stand pivot to [facility]), supervision for UB ADLs, and min assist for LB ADLs. Pt wants to d/c home. She lives alone but reports that family/friend is available to assist her intermittently throughout the day. Recommending HHOT for increased independence and safety with ADLs . Pt would benefit from continued skilled OT services in order to increase independence with ADLs prior to d/c home.     Follow Up Recommendations  Home health OT;Supervision - Intermittent    Equipment Recommendations  None recommended by OT (Pt reports that she has 3 in 1 at home)    Recommendations for Other Services       Precautions / Restrictions Precautions Precautions: Knee Precaution Comments: Reviewed no pllow under knee and precautions. Restrictions Weight Bearing Restrictions: Yes LLE Weight Bearing: Weight bearing as tolerated      Mobility Bed Mobility Overal bed mobility: Needs Assistance Bed Mobility: Supine to Sit     Supine to sit: Min guard     General bed mobility comments: Increased time, HOB not elevated  Transfers Overall transfer level: Needs assistance Equipment used: Rolling walker (2 wheeled) Transfers: Sit to/from [facility] Sit to Stand: Min assist Stand pivot transfers: Min assist       General transfer comment: Min assist to boost from EOB. Verbal cues for hand placement. Stand pivot transfer from bed to [facility], BSC to recliner    Balance Overall balance assessment: Needs assistance Sitting-balance support: Feet supported Sitting balance-Leahy Scale: Fair     Standing balance support: During functional  activity Standing balance-Leahy Scale: Poor Standing balance comment: Able to take one hand off RW for peri care                            ADL Overall ADL's : Needs assistance/impaired Eating/Feeding: Set up;Sitting   Grooming: Min guard;Standing   Upper Body Bathing: Supervision/ safety;Sitting   Lower Body Bathing: Minimal assistance;Sit to/from stand   Upper Body Dressing : Supervision/safety;Sitting   Lower Body Dressing: Minimal assistance;Sit to/from stand Lower Body Dressing Details (indicate cue type and reason): Pt unable to doff/don L sock but able to reach down to L ankle Toilet Transfer: BSC;RW;Minimal assistance;Stand-pivot   Toileting- Clothing Manipulation and Hygiene: Min guard;Sit to/from stand   Tub/ Banker: Minimal assistance;Stand-pivot;3 in 1;Rolling walker   Functional mobility during ADLs: Minimal assistance General ADL Comments: [place] educated pt on LB dressing technique, tub transfer using 3 in 1 (and provided handout)     Vision     Perception     Praxis      Pertinent Vitals/Pain Pain Assessment: 0-10 Pain Score: 9  Pain Location: L knee Pain Descriptors / Indicators: Aching;Grimacing;Sore Pain Intervention(s): Limited activity within patient's tolerance;Monitored during session;Repositioned;Ice applied     Hand Dominance Right   Extremity/Trunk Assessment Upper Extremity Assessment Upper Extremity Assessment: Generalized weakness   Lower Extremity Assessment Lower Extremity Assessment: Defer to PT evaluation   Cervical / Trunk Assessment Cervical / Trunk Assessment: Normal   Communication Communication Communication: No difficulties   Cognition Arousal/Alertness: Awake/alert Behavior During Therapy: WFL for tasks assessed/performed Overall Cognitive Status:  Within Functional Limits for tasks assessed                     General Comments       Exercises       Shoulder Instructions       Home Living Family/patient expects to be discharged to:: Private residence Living Arrangements: Alone Available Help at Discharge: Family;Available PRN/intermittently Type of Home: Apartment Home Access: Level entry     Home Layout: One level     Bathroom Shower/Tub: Tub/shower unit Shower/tub characteristics: Architectural technologist: Standard Bathroom Accessibility: Yes How Accessible: Accessible via walker Home Equipment: [place] - 2 wheels;Bedside commode;Cane - quad;Electric scooter          Prior Functioning/Environment Level of Independence: Independent with assistive device(s)        Comments: Pt using quad cane for ambulation PTA. Using motorized w/c for community distances. Does not drive. Granddaiughter assists with grocery shopping and neighbor.    OT Diagnosis: Generalized weakness;Acute pain   OT Problem List: Decreased strength;Decreased activity tolerance;Impaired balance (sitting and/or standing);Decreased safety awareness;Decreased knowledge of use of DME or AE;Decreased knowledge of precautions;Pain   OT Treatment/Interventions: Self-care/ADL training;DME and/or AE instruction;Patient/family education    OT Goals(Current goals can be found in the care plan section) Acute Rehab OT Goals Patient Stated Goal: to go home OT Goal Formulation: With patient Time For Goal Achievement: 03/02/15 Potential to Achieve Goals: Good ADL Goals Pt Will Perform Grooming: with modified independence;standing Pt Will Perform Lower Body Bathing: with supervision;sit to/from stand Pt Will Perform Lower Body Dressing: with supervision;sit to/from stand Pt Will Transfer to Toilet: with modified independence;ambulating;bedside commode (BSC over toilet) Pt Will Perform Tub/Shower Transfer: with supervision;ambulating;3 in 1;rolling walker  OT Frequency: Min 2X/week   Barriers to D/C:            Co-evaluation              End of Session Equipment Utilized During  Treatment: Gait belt;Rolling walker;Oxygen CPM Left Knee CPM Left Knee: Off Left Knee Flexion (Degrees): 90 Left Knee Extension (Degrees): 0 Nurse Communication: Other (comment) (pt needs IV reset )  Activity Tolerance: Patient tolerated treatment well Patient left: in chair;with call bell/phone within reach;Other (comment) (zero degree bone foam applied LLE)   Time: 3833-3832 OT Time Calculation (min): 34 min Charges:  OT General Charges $OT Visit: 1 Procedure OT Evaluation $Initial OT Evaluation Tier I: 1 Procedure OT Treatments $Self Care/Home Management : 8-22 mins G-Codes:     Binnie Kand M.S., OTR/L Pager: [phone]  02/16/2015, 9:15 AM

## 2015-02-16 NOTE — Progress Notes (Signed)
Physical Therapy Treatment Patient Details Name: [name] [name] MRN: [id number] DOB: August 17, 1945 Today's Date: 02/16/2015    History of Present Illness Patient is a 69 y/o female s/p L TKA. PMH includes COPD, HTN, HLD, MI.    PT Comments    Patient progressing slowly towards PT goals. Improved ambulation distance today but continues to be limited by pain and fatigue. Provided handout and instructed pt in exercises. Pt lives alone so needs to be Mod I for mobility. Will plan for gait training and bed mobility this PM.  Will follow acutely to maximize independence and mobility prior to return home.  Follow Up Recommendations  Home health PT;Supervision/Assistance - 24 hour     Equipment Recommendations  None recommended by PT    Recommendations for Other Services       Precautions / Restrictions Precautions Precautions: Knee Precaution Booklet Issued: Yes (comment) Precaution Comments: Reviewed no pllow under knee and precautions. Restrictions Weight Bearing Restrictions: Yes LLE Weight Bearing: Weight bearing as tolerated    Mobility  Bed Mobility Overal bed mobility: Needs Assistance Bed Mobility: Supine to Sit     Supine to sit: Min guard     General bed mobility comments: Sitting in recliner upon PT arrival.   Transfers Overall transfer level: Needs assistance Equipment used: Rolling walker (2 wheeled) Transfers: Sit to/from Stand Sit to Stand: Min guard Stand pivot transfers: Min assist       General transfer comment: Min guard for safety. Cues for hand placement.   Ambulation/Gait Ambulation/Gait assistance: Min guard Ambulation Distance (Feet): 25 Feet Assistive device: Rolling walker (2 wheeled) Gait Pattern/deviations: Decreased stance time - left;Decreased step length - right;Trunk flexed   Gait velocity interpretation: <1.8 ft/sec, indicative of risk for recurrent falls General Gait Details: Slow, unsteady gait with multiple standing rest breaks due to  fatigue/pain. Ambulated on RA and Sp02 98%.   Stairs            Wheelchair Mobility    Modified Rankin (Stroke Patients Only)       Balance Overall balance assessment: Needs assistance Sitting-balance support: Feet supported;No upper extremity supported Sitting balance-Leahy Scale: Fair     Standing balance support: During functional activity Standing balance-Leahy Scale: Poor Standing balance comment: Able to take one hand off RW for peri care                    Cognition Arousal/Alertness: Awake/alert Behavior During Therapy: WFL for tasks assessed/performed Overall Cognitive Status: Within Functional Limits for tasks assessed                      Exercises Total Joint Exercises Ankle Circles/Pumps: Both;10 reps;Seated Quad Sets: Both;10 reps;Seated Towel Squeeze: Both;10 reps;Seated Heel Slides: Left;10 reps;Seated Hip ABduction/ADduction: Left;10 reps;Seated Goniometric ROM: 7-70 degrees knee AROM    General Comments General comments (skin integrity, edema, etc.): No family present during eval. Pt reports that she wants to go home upon d/c and will have intermittent support from family/friends. SpO2 in low 80s during bed mobility and transfer from bed > BSC, BSC > recliner. Pt asymptomatic. RT moved SpO2 from ear to finger, SpO2 reading 99/100       Pertinent Vitals/Pain Pain Assessment: 0-10 Pain Score: 9  Pain Location: left knee Pain Descriptors / Indicators: Sore;Aching Pain Intervention(s): Monitored during session;Repositioned;Premedicated before session;Ice applied;Limited activity within patient's tolerance    Home Living Family/patient expects to be discharged to:: Private residence Living Arrangements: Alone Available Help at  Discharge: Family;Available PRN/intermittently Type of Home: Apartment Home Access: Level entry   Home Layout: One level Home Equipment: Walker - 2 wheels;Bedside commode;Cane - quad;Electric scooter       Prior Function Level of Independence: Independent with assistive device(s)      Comments: Pt using quad cane for ambulation PTA. Using motorized w/c for community distances. Does not drive. Granddaiughter assists with grocery shopping and neighbor.   PT Goals (current goals can now be found in the care plan section) Acute Rehab PT Goals Patient Stated Goal: to go home Progress towards PT goals: Progressing toward goals    Frequency  7X/week    PT Plan Current plan remains appropriate    Co-evaluation             End of Session Equipment Utilized During Treatment: Gait belt Activity Tolerance: Patient tolerated treatment well Patient left: in chair;with call bell/phone within reach;Other (comment) (in zero degree knee)     Time: 5732-2025 PT Time Calculation (min) (ACUTE ONLY): 22 min  Charges:  $Gait Training: 8-22 mins                    G Codes:      Shauna A Hartshorne 02/16/2015, 11:21 AM  Wray Kearns, PT, DPT [phone]

## 2015-02-16 NOTE — Discharge Instructions (Signed)

## 2015-02-16 NOTE — Care Management Note (Addendum)
Case Management Note  Patient Details  Name: [name] [name] MRN: [id number] Date of Birth: 21-Feb-1946  Subjective/Objective:   69 yr old female s/p left total knee arthroplasty.                 Action/Plan: Case manager spoke with patient concerning home health and DME needs at discharge. Patient was preoperatively setup with [facility], no changes. CPM, 3in1 and Rolling walker have been delivered to patient's home. Family will assist at discharge.    Expected Discharge Date:    02/17/15              Expected Discharge Plan:  [place]  In-House Referral:  NA  Discharge planning Services  CM Consult  Post Acute Care Choice:  Durable Medical Equipment Choice offered to:  Patient  DME Arranged:  CPM, Walker DME Agency:  [employer]  HH Arranged:  PT HH Agency:  [place]  Status of Service:  Completed, signed off  Medicare Important Message Given:    Date Medicare IM Given:    Medicare IM give by:    Date Additional Medicare IM Given:    Additional Medicare Important Message give by:     If discussed at [place] of Stay Meetings, dates discussed:    Additional Comments:  Ninfa Meeker, RN 02/16/2015, 11:02 AM

## 2015-02-16 NOTE — Progress Notes (Signed)
SPORTS MEDICINE AND JOINT REPLACEMENT  [name] Mulch, MD   [name] Spry, PA-C [place], [place], [place]  [address]                             [phone]   PROGRESS NOTE  Subjective:  negative for Chest Pain  negative for Shortness of Breath  negative for Nausea/Vomiting   negative for Calf Pain  negative for Bowel Movement   Tolerating Diet: yes         Patient reports pain as 6 on 0-10 scale.    Objective: Vital signs in last 24 hours:   Patient Vitals for the past 24 hrs:  BP Temp Temp src Pulse Resp SpO2  02/16/15 0602 (!) 144/93 mmHg 98.6 F (37 C) - 79 18 98 %  02/16/15 0040 131/60 mmHg 98.2 F (36.8 C) - 77 17 100 %  02/15/15 2124 123/79 mmHg 98 F (36.7 C) Oral 69 18 100 %  02/15/15 2120 123/79 mmHg 98 F (36.7 C) Oral 69 18 100 %  02/15/15 1556 93/60 mmHg 97.1 F (36.2 C) Oral 65 20 100 %  02/15/15 1355 - - - 65 18 99 %  02/15/15 1133 (!) 117/50 mmHg 97.7 F (36.5 C) Oral 69 16 97 %  02/15/15 1030 - - - 74 17 100 %  02/15/15 1015 - - - 76 15 100 %  02/15/15 1000 115/63 mmHg - - 73 14 100 %  02/15/15 0945 - - - 73 15 100 %  02/15/15 0930 - - - 73 13 99 %  02/15/15 0927 - 98 F (36.7 C) - - - -    @flow {1959:LAST@   Intake/Output from previous day:   09/19 0701 - 09/20 0700 In: 280 [P.O.:280] Out: -    Intake/Output this shift:       Intake/Output      09/19 0701 - 09/20 0700 09/20 0701 - 09/21 0700   P.O. 280    Total Intake(mL/kg) 280 (3.8)    Net +280          Urine Occurrence 2 x       LABORATORY DATA:  Recent Labs  02/15/15 1245 02/16/15 0535  WBC 13.2* 8.4  HGB 13.0 12.0  HCT 39.3 36.1  PLT 186 144*    Recent Labs  02/15/15 1245 02/16/15 0535  NA  --  131*  K  --  3.8  CL  --  99*  CO2  --  26  BUN  --  7  CREATININE 0.77 0.55  GLUCOSE  --  118*  CALCIUM  --  8.3*   Lab Results  Component Value Date   INR 1.02 01/28/2015    Examination:  General appearance: alert, cooperative and no  distress Extremities: Homans sign is negative, no sign of DVT  Wound Exam: clean, dry, intact   Drainage:  Scant/small amount Serosanguinous exudate  Motor Exam: EHL and FHL Intact  Sensory Exam: Deep Peroneal normal   Assessment:    1 Day Post-Op  Procedure(s) (LRB): TOTAL KNEE ARTHROPLASTY (Left)  ADDITIONAL DIAGNOSIS:  Active Problems:   S/P total knee arthroplasty  Acute Blood Loss Anemia   Plan: Physical Therapy as ordered Weight Bearing as Tolerated (WBAT)  DVT Prophylaxis:  Lovenox  DISCHARGE PLAN: Home  DISCHARGE NEEDS: HHPT, CPM, Walker and 3-in-1 comode seat         [name],[name] 02/16/2015, 7:22 AM

## 2015-02-17 ENCOUNTER — Encounter (HOSPITAL_COMMUNITY): Payer: Self-pay | Admitting: General Practice

## 2015-02-17 LAB — CBC
HCT: 33.6 % — ABNORMAL LOW (ref 36.0–46.0)
Hemoglobin: 11 g/dL — ABNORMAL LOW (ref 12.0–15.0)
MCH: 31.1 pg (ref 26.0–34.0)
MCHC: 32.7 g/dL (ref 30.0–36.0)
MCV: 94.9 fL (ref 78.0–100.0)
PLATELETS: 150 10*3/uL (ref 150–400)
RBC: 3.54 MIL/uL — AB (ref 3.87–5.11)
RDW: 13.9 % (ref 11.5–15.5)
WBC: 8.9 10*3/uL (ref 4.0–10.5)

## 2015-02-17 LAB — GLUCOSE, CAPILLARY
GLUCOSE-CAPILLARY: 121 mg/dL — AB (ref 65–99)
Glucose-Capillary: 106 mg/dL — ABNORMAL HIGH (ref 65–99)

## 2015-02-17 MED ORDER — ENOXAPARIN SODIUM 40 MG/0.4ML ~~LOC~~ SOLN: 40.0000 mg | SUBCUTANEOUS | Status: DC

## 2015-02-17 MED ORDER — METHOCARBAMOL 500 MG PO TABS: 500.0000 mg | ORAL_TABLET | Freq: Four times a day (QID) | ORAL | Status: DC | PRN

## 2015-02-17 MED ORDER — OXYCODONE HCL 5 MG PO TABS: 5.0000 mg | ORAL_TABLET | ORAL | Status: DC | PRN

## 2015-02-17 MED ORDER — METOCLOPRAMIDE HCL 5 MG PO TABS: 5.0000 mg | ORAL_TABLET | Freq: Three times a day (TID) | ORAL | Status: DC | PRN

## 2015-02-17 MED ORDER — OXYCODONE HCL ER 10 MG PO T12A: 10.0000 mg | EXTENDED_RELEASE_TABLET | Freq: Two times a day (BID) | ORAL | Status: DC

## 2015-02-17 NOTE — Discharge Summary (Signed)
[place]   [name] Mulch, MD   [name] Spry, PA-C [place], [place], [place]  [address]                             [phone]  PATIENT ID: [name] [name]        MRN:  [id number]          DOB/AGE: 11/26/1945 / 69 y.o.    DISCHARGE SUMMARY  ADMISSION DATE:    02/15/2015 DISCHARGE DATE:   02/17/2015   ADMISSION DIAGNOSIS: primary osteoarthritis left knee    DISCHARGE DIAGNOSIS:  primary osteoarthritis left knee    ADDITIONAL DIAGNOSIS: Active Problems:   S/P total knee arthroplasty  Past Medical History  Diagnosis Date  . Arthritis   . Diabetes mellitus without complication   . Rash   . Sleep trouble   . Frequent headaches   . Joint pain   . COPD (chronic obstructive pulmonary disease)   . SOB (shortness of breath)   . Dyspnea on exertion   . Eye problem   . Weakness   . Insomnia   . Bowel trouble   . Colon disorder   . IBS (irritable bowel syndrome)   . Heart disease   . Hypertension   . Hyperlipidemia   . Fibromyalgia   . Enlarged aorta   . Asthma   . Depression   . GERD (gastroesophageal reflux disease)   . Myocardial infarction     mild-heart attack  pt.living in [place] at the time    PROCEDURE: Procedure(s): TOTAL KNEE ARTHROPLASTY on 02/15/2015  CONSULTS:     HISTORY:  See H&P in chart  HOSPITAL COURSE:  [name] [name] is a 69 y.o. admitted on 02/15/2015 and found to have a diagnosis of primary osteoarthritis left knee.  After appropriate laboratory studies were obtained  they were taken to the operating room on 02/15/2015 and underwent Procedure(s): TOTAL KNEE ARTHROPLASTY.   They were given perioperative antibiotics:  Anti-infectives    Start     Dose/Rate Route Frequency Ordered Stop   02/15/15 1300  ceFAZolin (ANCEF) IVPB 1 g/50 mL premix     1 g 100 mL/hr over 30 Minutes Intravenous Every 6 hours 02/15/15 1140 02/15/15 2052   02/15/15 0600  ceFAZolin (ANCEF) IVPB 2 g/50 mL premix     2 g 100 mL/hr over 30  Minutes Intravenous On call to O.R. 02/15/15 0600 02/15/15 0728    .  Tolerated the procedure well.  Placed with a foley intraoperatively.  Given Ofirmev at induction and for 48 hours.    POD# 1: Vital signs were stable.  Patient denied Chest pain, shortness of breath, or calf pain.  Patient was started on Lovenox 30 mg subcutaneously twice daily at 8am.  Consults to PT, OT, and care management were made.  The patient was weight bearing as tolerated.  CPM was placed on the operative leg 0-90 degrees for 6-8 hours a day.  Incentive spirometry was taught.  Dressing was changed.        POD #2, Continued  PT for ambulation and exercise program.  IV saline locked.  O2 discontinued.    The remainder of the hospital course was dedicated to ambulation and strengthening.   The patient was discharged on 2 Days Post-Op in  Good condition.  Blood products given:none  DIAGNOSTIC STUDIES: Recent vital signs: Patient Vitals for the past 24 hrs:  BP Temp Temp  src Pulse Resp SpO2  02/17/15 1516 - - - - - 98 %  02/17/15 0821 - - - - - 98 %  02/17/15 0513 114/62 mmHg 97.7 F (36.5 C) Oral 92 18 97 %  02/16/15 2110 115/60 mmHg 98.4 F (36.9 C) - 88 18 97 %       Recent laboratory studies:  Recent Labs  02/15/15 1245 02/16/15 0535 02/17/15 0443  WBC 13.2* 8.4 8.9  HGB 13.0 12.0 11.0*  HCT 39.3 36.1 33.6*  PLT 186 144* 150    Recent Labs  02/15/15 1245 02/16/15 0535  NA  --  131*  K  --  3.8  CL  --  99*  CO2  --  26  BUN  --  7  CREATININE 0.77 0.55  GLUCOSE  --  118*  CALCIUM  --  8.3*   Lab Results  Component Value Date   INR 1.02 01/28/2015     Recent Radiographic Studies :  Dg Chest 2 View  01/28/2015   CLINICAL DATA:  Preop for knee replacement.  EXAM: CHEST  2 VIEW  COMPARISON:  None.  FINDINGS: Barrel shaped chest, COPD in this long-term smoker. Normal heart size with aortic tortuosity. There is no edema, consolidation, effusion, or pneumothorax.  Advanced lumbar  degenerative disc disease with exaggerated kyphosis at the thoracolumbar junction.  IMPRESSION: No active cardiopulmonary disease.  COPD.   Electronically Signed   By: Monte Fantasia M.D.   On: 01/28/2015 16:19    DISCHARGE INSTRUCTIONS: Discharge Instructions    CPM    Complete by:  As directed   Continuous passive motion machine (CPM):      Use the CPM from 0 to 90 for 6-8 hours per day.      You may increase by 10 per day.  You may break it up into 2 or 3 sessions per day.      Use CPM for 2 weeks or until you are told to stop.     Call MD / Call 911    Complete by:  As directed   If you experience chest pain or shortness of breath, CALL 911 and be transported to the hospital emergency room.  If you develope a fever above 101 F, pus (white drainage) or increased drainage or redness at the wound, or calf pain, call your surgeon's office.     Change dressing    Complete by:  As directed   Change dressing on Thursday, then change the dressing daily with sterile 4 x 4 inch gauze dressing and apply TED hose.     Constipation Prevention    Complete by:  As directed   Drink plenty of fluids.  Prune juice may be helpful.  You may use a stool softener, such as Colace (over the counter) 100 mg twice a day.  Use MiraLax (over the counter) for constipation as needed.     Diet - low sodium heart healthy    Complete by:  As directed      Do not put a pillow under the knee. Place it under the heel.    Complete by:  As directed      Driving restrictions    Complete by:  As directed   No driving for 6 weeks     Increase activity slowly as tolerated    Complete by:  As directed      Lifting restrictions    Complete by:  As directed   No lifting for  6 weeks     TED hose    Complete by:  As directed   Use stockings (TED hose) for 2 weeks on both leg(s).  You may remove them at night for sleeping.           DISCHARGE MEDICATIONS:     Medication List    TAKE these medications         albuterol 108 (90 BASE) MCG/ACT inhaler  Commonly known as:  PROVENTIL HFA;VENTOLIN HFA  Inhale into the lungs every 6 (six) hours as needed for wheezing or shortness of breath.     buPROPion 150 MG 24 hr tablet  Commonly known as:  WELLBUTRIN XL  Take 150 mg by mouth daily.     celecoxib 200 MG capsule  Commonly known as:  CELEBREX  Take 200 mg by mouth daily.     cetirizine 10 MG tablet  Commonly known as:  ZYRTEC  Take 10 mg by mouth daily.     enoxaparin 40 MG/0.4ML injection  Commonly known as:  LOVENOX  Inject 0.4 mLs (40 mg total) into the skin daily.     furosemide 40 MG tablet  Commonly known as:  LASIX  Take 40 mg by mouth daily as needed for edema.     ipratropium 0.02 % nebulizer solution  Commonly known as:  ATROVENT  Take 0.5 mg by nebulization every 8 (eight) hours as needed for wheezing or shortness of breath.     lisinopril-hydrochlorothiazide 20-12.5 MG per tablet  Commonly known as:  PRINZIDE,ZESTORETIC  Take 2 tablets by mouth daily.     lovastatin 40 MG tablet  Commonly known as:  MEVACOR  Take 40 mg by mouth at bedtime.     metFORMIN 500 MG tablet  Commonly known as:  GLUCOPHAGE  Take 500 mg by mouth at bedtime.     methocarbamol 500 MG tablet  Commonly known as:  ROBAXIN  Take 1-2 tablets (500-1,000 mg total) by mouth every 6 (six) hours as needed for muscle spasms.     metoCLOPramide 5 MG tablet  Commonly known as:  REGLAN  Take 1-2 tablets (5-10 mg total) by mouth every 8 (eight) hours as needed for nausea (if ondansetron (ZOFRAN) ineffective.).     Milnacipran 50 MG Tabs tablet  Commonly known as:  SAVELLA  Take 50 mg by mouth 2 (two) times daily.     montelukast 10 MG tablet  Commonly known as:  SINGULAIR  Take 10 mg by mouth at bedtime.     nitroGLYCERIN 0.4 MG SL tablet  Commonly known as:  NITROSTAT  Place 0.4 mg under the tongue every 5 (five) minutes as needed for chest pain.     omeprazole 40 MG capsule  Commonly known as:   PRILOSEC  Take 40 mg by mouth daily as needed.     oxyCODONE 5 MG immediate release tablet  Commonly known as:  Oxy IR/ROXICODONE  Take 1-2 tablets (5-10 mg total) by mouth every 3 (three) hours as needed for breakthrough pain.     OxyCODONE 10 mg T12a 12 hr tablet  Commonly known as:  OXYCONTIN  Take 1 tablet (10 mg total) by mouth every 12 (twelve) hours.     tiotropium 18 MCG inhalation capsule  Commonly known as:  SPIRIVA  Place 18 mcg into inhaler and inhale daily as needed.     traZODone 100 MG tablet  Commonly known as:  DESYREL  Take 100 mg by mouth at bedtime.  FOLLOW UP VISIT:       Follow-up Information    Follow up with [facility].   Why:  Someone from [facility] will contact you concerning start date and time for therapy.   Contact information:   [address] [place] [address] [phone]       Follow up with Rudean Haskell, MD. Call on 03/02/2015.   Specialty:  Orthopedic Surgery   Contact information:   [place] [address] [place] [address] [phone]       DISPOSITION: HOME  CONDITION:  Good   JONES,MAURICE 02/17/2015, 5:07 PM

## 2015-02-17 NOTE — Progress Notes (Signed)
Physical Therapy Treatment Patient Details Name: [name] [name] MRN: [id number] DOB: 1945/07/20 Today's Date: 02/17/2015    History of Present Illness Patient is a 69 y/o female s/p L TKA. PMH includes COPD, HTN, HLD, MI.    PT Comments    Patient progressing well towards PT goals. Improved ambulation distance this session. Pain seems more controlled today. Requires encouragement for mobility. Will plan for there ex and gait training in PM session to prepare pt for discharge home. Will follow acutely per current POC.   Follow Up Recommendations  Home health PT;Supervision/Assistance - 24 hour     Equipment Recommendations  None recommended by PT    Recommendations for Other Services       Precautions / Restrictions Precautions Precautions: Knee Precaution Booklet Issued: Yes (comment) Precaution Comments: Reviewed no pllow under knee and precautions. Restrictions Weight Bearing Restrictions: Yes LLE Weight Bearing: Weight bearing as tolerated    Mobility  Bed Mobility Overal bed mobility: Needs Assistance Bed Mobility: Supine to Sit     Supine to sit: Supervision     General bed mobility comments: HOB flat, no use of rails to simulate home. Cues for technique and to use RLE to bring LLE to EOB.   Transfers Overall transfer level: Needs assistance Equipment used: Rolling walker (2 wheeled) Transfers: Sit to/from Stand Sit to Stand: Min guard         General transfer comment: Min guard for safety. Cues for hand placement. Stood fromE OB x1, from toilet x1, from chair x1. Transferred to chair post ambulation bout.  Ambulation/Gait Ambulation/Gait assistance: Min guard Ambulation Distance (Feet): 20 Feet (+ 75') Assistive device: Rolling walker (2 wheeled) Gait Pattern/deviations: Decreased stance time - left;Decreased step length - right;Step-through pattern;Step-to pattern;Antalgic   Gait velocity interpretation: <1.8 ft/sec, indicative of risk for recurrent  falls General Gait Details: Cues for step through gait pattern. A few standing rest breaks due to fatigue.    Stairs            Wheelchair Mobility    Modified Rankin (Stroke Patients Only)       Balance Overall balance assessment: Needs assistance Sitting-balance support: Feet supported;No upper extremity supported Sitting balance-Leahy Scale: Good     Standing balance support: During functional activity Standing balance-Leahy Scale: Fair                      Cognition Arousal/Alertness: Awake/alert Behavior During Therapy: WFL for tasks assessed/performed Overall Cognitive Status: Within Functional Limits for tasks assessed                      Exercises Total Joint Exercises Ankle Circles/Pumps: Both;10 reps;Supine Quad Sets: Both;10 reps;Seated Goniometric ROM: 4-90 degres knee AROM.    General Comments        Pertinent Vitals/Pain Pain Assessment: 0-10 Pain Score: 8  Pain Location: left knee Pain Descriptors / Indicators: Sore;Aching Pain Intervention(s): Monitored during session;Repositioned;Limited activity within patient's tolerance;Premedicated before session;RN gave pain meds during session    [place] expects to be discharged to:: Private residence Living Arrangements: Alone                  Prior Function            PT Goals (current goals can now be found in the care plan section) Progress towards PT goals: Progressing toward goals    Frequency  7X/week    PT Plan Current plan remains appropriate  Co-evaluation             End of Session Equipment Utilized During Treatment: Gait belt Activity Tolerance: Patient tolerated treatment well Patient left: in chair;with call bell/phone within reach     Time: 0949-1020 PT Time Calculation (min) (ACUTE ONLY): 31 min  Charges:  $Gait Training: 8-22 mins $Therapeutic Activity: 8-22 mins                    G Codes:      [name] [name] 02/17/2015, 11:37 AM Wray Kearns, PT, DPT [phone]

## 2015-02-17 NOTE — Progress Notes (Signed)
SPORTS MEDICINE AND JOINT REPLACEMENT  [name], MD   Carlynn Spry, PA-C [place], [place], [place]  [address]                             [phone]   PROGRESS NOTE  Subjective:  negative for Chest Pain  negative for Shortness of Breath  negative for Nausea/Vomiting   negative for Calf Pain  negative for Bowel Movement   Tolerating Diet: yes         Patient reports pain as 4 on 0-10 scale.    Objective: Vital signs in last 24 hours:   Patient Vitals for the past 24 hrs:  BP Temp Temp src Pulse Resp SpO2  02/17/15 1516 - - - - - 98 %  02/17/15 0821 - - - - - 98 %  02/17/15 0513 114/62 mmHg 97.7 F (36.5 C) Oral 92 18 97 %  02/16/15 2110 115/60 mmHg 98.4 F (36.9 C) - 88 18 97 %    @flow {1959:LAST@   Intake/Output from previous day:   09/20 0701 - 09/21 0700 In: 2026 [P.O.:900; I.V.:1126] Out: -    Intake/Output this shift:       Intake/Output      09/20 0701 - 09/21 0700 09/21 0701 - 09/22 0700   P.O. 900    I.V. (mL/kg) 1126 (15.2)    Total Intake(mL/kg) 2026 (27.3)    Net +2026          Urine Occurrence 7 x       LABORATORY DATA:  Recent Labs  02/15/15 1245 02/16/15 0535 02/17/15 0443  WBC 13.2* 8.4 8.9  HGB 13.0 12.0 11.0*  HCT 39.3 36.1 33.6*  PLT 186 144* 150    Recent Labs  02/15/15 1245 02/16/15 0535  NA  --  131*  K  --  3.8  CL  --  99*  CO2  --  26  BUN  --  7  CREATININE 0.77 0.55  GLUCOSE  --  118*  CALCIUM  --  8.3*   Lab Results  Component Value Date   INR 1.02 01/28/2015    Examination:  General appearance: alert, cooperative and no distress Extremities: Homans sign is negative, no sign of DVT  Wound Exam: clean, dry, intact   Drainage:  None: wound tissue dry  Motor Exam: EHL and FHL Intact  Sensory Exam: Deep Peroneal normal   Assessment:    2 Days Post-Op  Procedure(s) (LRB): TOTAL KNEE ARTHROPLASTY (Left)  ADDITIONAL DIAGNOSIS:  Active Problems:   S/P total knee  arthroplasty  Acute Blood Loss Anemia   Plan: Physical Therapy as ordered Weight Bearing as Tolerated (WBAT)  DVT Prophylaxis:  Lovenox  DISCHARGE PLAN: Home  DISCHARGE NEEDS: HHPT, CPM, Walker and 3-in-1 comode seat         Ramere Downs 02/17/2015, 5:04 PM

## 2015-02-17 NOTE — Progress Notes (Signed)
Occupational Therapy Treatment Patient Details Name: [name] [name] MRN: [id number] DOB: 04/28/1946 Today's Date: 02/17/2015    History of present illness Patient is a 69 y/o female s/p L TKA. PMH includes COPD, HTN, HLD, MI.   OT comments  Pt progressing well toward OT goals. Pt reports significant pain (8/10) at this time but was willing to participate in therapy. Pt able to complete dressing with set up and supervision for sit <> stand with LB. Able to perform bed mobility and functional transfer with supervision for safety. Will continue to follow acutely to increase independence with ADLs prior to d/c home.    Follow Up Recommendations  Home health OT;Supervision - Intermittent    Equipment Recommendations  None recommended by OT (Pt reports that she has a 3 in 1 at home)    Recommendations for Other Services      Precautions / Restrictions Precautions Precautions: Knee Precaution Booklet Issued: Yes (comment) Precaution Comments: Reviewed no pllow under knee and precautions. Restrictions Weight Bearing Restrictions: Yes LLE Weight Bearing: Weight bearing as tolerated       Mobility Bed Mobility Overal bed mobility: Needs Assistance Bed Mobility: Supine to Sit     Supine to sit: Supervision Sit to supine: Supervision   General bed mobility comments: HOB flat, no use of rails to simulate home. Cues for technique and to use RLE to bring LLE to EOB.   Transfers Overall transfer level: Needs assistance Equipment used: Rolling walker (2 wheeled) Transfers: Sit to/from Stand Sit to Stand: Supervision         General transfer comment: Supervision for safety. Stood from EOB x 1    Balance Overall balance assessment: Needs assistance Sitting-balance support: Feet supported Sitting balance-Leahy Scale: Good     Standing balance support: During functional activity Standing balance-Leahy Scale: Fair Standing balance comment: No UE supported during LB dressing                    ADL Overall ADL's : Needs assistance/impaired                 Upper Body Dressing : Set up;Sitting Upper Body Dressing Details (indicate cue type and reason): Able to don bra and pull over shirt Lower Body Dressing: Set up;Supervision/safety;Sit to/from stand Lower Body Dressing Details (indicate cue type and reason): Able to don underware and pants                      Vision                     Perception     Praxis      Cognition   Behavior During Therapy: [facility] for tasks assessed/performed Overall Cognitive Status: Within Functional Limits for tasks assessed                       Extremity/Trunk Assessment               Exercises Total Joint Exercises Ankle Circles/Pumps: Both;10 reps;Supine Quad Sets: Both;10 reps;Seated Towel Squeeze: Both;10 reps;Supine Short Arc Quad: AAROM;Left;10 reps;Supine Heel Slides: Left;10 reps;Supine Hip ABduction/ADduction: Left;10 reps;Supine Long Arc Quad: AAROM;Left;10 reps;Seated Goniometric ROM: 4-90 degres knee AROM.   Shoulder Instructions       General Comments      Pertinent Vitals/ Pain       Pain Assessment: 0-10 Pain Score: 8  Faces Pain Scale: Hurts a little bit Pain Location: L  knee Pain Descriptors / Indicators: Aching;Sore Pain Intervention(s): Monitored during session;Repositioned;Patient requesting pain meds-RN notified;RN gave pain meds during session  Home Living                                          Prior Functioning/Environment              Frequency Min 2X/week     Progress Toward Goals  OT Goals(current goals can now be found in the care plan section)  Progress towards OT goals: Progressing toward goals  Acute Rehab OT Goals Patient Stated Goal: none stated Time For Goal Achievement: 03/02/15 Potential to Achieve Goals: Good  Plan Discharge plan remains appropriate    Co-evaluation                 End  of Session Equipment Utilized During Treatment: Gait belt;Rolling walker CPM Left Knee CPM Left Knee: Off   Activity Tolerance Patient tolerated treatment well   Patient Left in chair;with call bell/phone within reach;Other (comment) (RT in room, zero degree knee applied LLE)   Nurse Communication Patient requests pain meds        Time: 6270-3500 OT Time Calculation (min): 22 min  Charges: OT General Charges $OT Visit: 1 Procedure OT Treatments $Self Care/Home Management : 8-22 mins  Binnie Kand M.S., OTR/L Pager: [phone]  02/17/2015, 3:25 PM

## 2015-02-17 NOTE — Progress Notes (Signed)
Vaughan Basta Mofield discharged home per MD order. Discharge instructions reviewed and discussed with patient. All questions and concerns answered. Copy of instructions and scripts given to patient. IV removed.  Patient escorted to car by staff in a wheelchair. No distress noted upon discharge.   Tarri Abernethy R 02/17/2015 4:53 PM

## 2015-02-17 NOTE — Progress Notes (Signed)
Physical Therapy Treatment Patient Details Name: [name] [name] MRN: [id number] DOB: 16-Feb-1946 Today's Date: 02/17/2015    History of Present Illness Patient is a 69 y/o female s/p L TKA. PMH includes COPD, HTN, HLD, MI.    PT Comments    Patient progressing well towards PT goals. Able to perform bed mobility,transfers and short distance ambulation with supervision for safety. Reviewed exercises. Pt continues to fatigue easily and demonstrates decreased endurance. Will follow acutely to maximize independence and mobility prior to return home.   Follow Up Recommendations  Home health PT;Supervision/Assistance - 24 hour     Equipment Recommendations  None recommended by PT    Recommendations for Other Services OT consult     Precautions / Restrictions Precautions Precautions: Knee Precaution Booklet Issued: Yes (comment) Precaution Comments: Reviewed no pllow under knee and precautions. Restrictions Weight Bearing Restrictions: Yes LLE Weight Bearing: Weight bearing as tolerated    Mobility  Bed Mobility Overal bed mobility: Needs Assistance Bed Mobility: Sit to Supine     Supine to sit: Supervision Sit to supine: Supervision   General bed mobility comments: HOB flat, no use of rails to simulate home. Cues for technique and to use RLE to bring LLE to EOB.   Transfers Overall transfer level: Needs assistance Equipment used: Rolling walker (2 wheeled) Transfers: Sit to/from Stand Sit to Stand: Supervision         General transfer comment: Supervision for safety. Stood from [employer], from toilet x1.   Ambulation/Gait Ambulation/Gait assistance: Supervision Ambulation Distance (Feet): 20 Feet (x2 bouts) Assistive device: Rolling walker (2 wheeled) Gait Pattern/deviations: Decreased stance time - left;Decreased step length - right;Antalgic;Step-to pattern;Step-through pattern   Gait velocity interpretation: <1.8 ft/sec, indicative of risk for recurrent falls General  Gait Details: Cues for step through gait pattern and for knee extension during stance phase to activate quadriceps.   Stairs            Wheelchair Mobility    Modified Rankin (Stroke Patients Only)       Balance Overall balance assessment: Needs assistance Sitting-balance support: Feet supported;No upper extremity supported Sitting balance-Leahy Scale: Good     Standing balance support: During functional activity Standing balance-Leahy Scale: Fair                      Cognition Arousal/Alertness: Awake/alert Behavior During Therapy: WFL for tasks assessed/performed Overall Cognitive Status: Within Functional Limits for tasks assessed                      Exercises Total Joint Exercises Ankle Circles/Pumps: Both;10 reps;Supine Quad Sets: Both;10 reps;Seated Towel Squeeze: Both;10 reps;Supine Short Arc Quad: AAROM;Left;10 reps;Supine Heel Slides: Left;10 reps;Supine Hip ABduction/ADduction: Left;10 reps;Supine Long Arc Quad: AAROM;Left;10 reps;Seated    General Comments        Pertinent Vitals/Pain Pain Assessment: Faces Pain Score: 8  Faces Pain Scale: Hurts a little bit Pain Location: left knee Pain Descriptors / Indicators: Sore Pain Intervention(s): Monitored during session;Repositioned    Home Living                      Prior Function            PT Goals (current goals can now be found in the care plan section) Progress towards PT goals: Progressing toward goals    Frequency  7X/week    PT Plan Current plan remains appropriate    Co-evaluation  End of Session Equipment Utilized During Treatment: Gait belt Activity Tolerance: Patient tolerated treatment well Patient left: in bed;with call bell/phone within reach     Time: 1423-1449 PT Time Calculation (min) (ACUTE ONLY): 26 min  Charges:  $Therapeutic Exercise: 8-22 mins $Therapeutic Activity: 8-22 mins                    G Codes:       Shauna A Hartshorne 02/17/2015, 3:14 PM Wray Kearns, [place], DPT [phone]

## 2015-02-18 DIAGNOSIS — I252 Old myocardial infarction: Secondary | ICD-10-CM | POA: Diagnosis not present

## 2015-02-18 DIAGNOSIS — Z471 Aftercare following joint replacement surgery: Secondary | ICD-10-CM | POA: Diagnosis not present

## 2015-02-18 DIAGNOSIS — E119 Type 2 diabetes mellitus without complications: Secondary | ICD-10-CM | POA: Diagnosis not present

## 2015-02-18 DIAGNOSIS — J449 Chronic obstructive pulmonary disease, unspecified: Secondary | ICD-10-CM | POA: Diagnosis not present

## 2015-02-18 DIAGNOSIS — M797 Fibromyalgia: Secondary | ICD-10-CM | POA: Diagnosis not present

## 2015-02-18 DIAGNOSIS — I10 Essential (primary) hypertension: Secondary | ICD-10-CM | POA: Diagnosis not present

## 2015-02-19 DIAGNOSIS — I10 Essential (primary) hypertension: Secondary | ICD-10-CM | POA: Diagnosis not present

## 2015-02-19 DIAGNOSIS — E119 Type 2 diabetes mellitus without complications: Secondary | ICD-10-CM | POA: Diagnosis not present

## 2015-02-19 DIAGNOSIS — Z471 Aftercare following joint replacement surgery: Secondary | ICD-10-CM | POA: Diagnosis not present

## 2015-02-19 DIAGNOSIS — I252 Old myocardial infarction: Secondary | ICD-10-CM | POA: Diagnosis not present

## 2015-02-19 DIAGNOSIS — J449 Chronic obstructive pulmonary disease, unspecified: Secondary | ICD-10-CM | POA: Diagnosis not present

## 2015-02-19 DIAGNOSIS — M797 Fibromyalgia: Secondary | ICD-10-CM | POA: Diagnosis not present

## 2015-02-20 DIAGNOSIS — I10 Essential (primary) hypertension: Secondary | ICD-10-CM | POA: Diagnosis not present

## 2015-02-20 DIAGNOSIS — M797 Fibromyalgia: Secondary | ICD-10-CM | POA: Diagnosis not present

## 2015-02-20 DIAGNOSIS — I252 Old myocardial infarction: Secondary | ICD-10-CM | POA: Diagnosis not present

## 2015-02-20 DIAGNOSIS — J449 Chronic obstructive pulmonary disease, unspecified: Secondary | ICD-10-CM | POA: Diagnosis not present

## 2015-02-20 DIAGNOSIS — E119 Type 2 diabetes mellitus without complications: Secondary | ICD-10-CM | POA: Diagnosis not present

## 2015-02-20 DIAGNOSIS — Z471 Aftercare following joint replacement surgery: Secondary | ICD-10-CM | POA: Diagnosis not present

## 2015-02-22 DIAGNOSIS — E119 Type 2 diabetes mellitus without complications: Secondary | ICD-10-CM | POA: Diagnosis not present

## 2015-02-22 DIAGNOSIS — M797 Fibromyalgia: Secondary | ICD-10-CM | POA: Diagnosis not present

## 2015-02-22 DIAGNOSIS — J449 Chronic obstructive pulmonary disease, unspecified: Secondary | ICD-10-CM | POA: Diagnosis not present

## 2015-02-22 DIAGNOSIS — Z471 Aftercare following joint replacement surgery: Secondary | ICD-10-CM | POA: Diagnosis not present

## 2015-02-22 DIAGNOSIS — I10 Essential (primary) hypertension: Secondary | ICD-10-CM | POA: Diagnosis not present

## 2015-02-22 DIAGNOSIS — I252 Old myocardial infarction: Secondary | ICD-10-CM | POA: Diagnosis not present

## 2015-02-23 DIAGNOSIS — Z471 Aftercare following joint replacement surgery: Secondary | ICD-10-CM | POA: Diagnosis not present

## 2015-02-23 DIAGNOSIS — M797 Fibromyalgia: Secondary | ICD-10-CM | POA: Diagnosis not present

## 2015-02-23 DIAGNOSIS — I252 Old myocardial infarction: Secondary | ICD-10-CM | POA: Diagnosis not present

## 2015-02-23 DIAGNOSIS — J449 Chronic obstructive pulmonary disease, unspecified: Secondary | ICD-10-CM | POA: Diagnosis not present

## 2015-02-23 DIAGNOSIS — E119 Type 2 diabetes mellitus without complications: Secondary | ICD-10-CM | POA: Diagnosis not present

## 2015-02-23 DIAGNOSIS — I10 Essential (primary) hypertension: Secondary | ICD-10-CM | POA: Diagnosis not present

## 2015-02-26 DIAGNOSIS — J449 Chronic obstructive pulmonary disease, unspecified: Secondary | ICD-10-CM | POA: Diagnosis not present

## 2015-02-26 DIAGNOSIS — Z471 Aftercare following joint replacement surgery: Secondary | ICD-10-CM | POA: Diagnosis not present

## 2015-02-26 DIAGNOSIS — I252 Old myocardial infarction: Secondary | ICD-10-CM | POA: Diagnosis not present

## 2015-02-26 DIAGNOSIS — E119 Type 2 diabetes mellitus without complications: Secondary | ICD-10-CM | POA: Diagnosis not present

## 2015-02-26 DIAGNOSIS — M797 Fibromyalgia: Secondary | ICD-10-CM | POA: Diagnosis not present

## 2015-02-26 DIAGNOSIS — I10 Essential (primary) hypertension: Secondary | ICD-10-CM | POA: Diagnosis not present

## 2015-03-01 DIAGNOSIS — Z96652 Presence of left artificial knee joint: Secondary | ICD-10-CM | POA: Diagnosis not present

## 2015-03-01 DIAGNOSIS — M25562 Pain in left knee: Secondary | ICD-10-CM | POA: Diagnosis not present

## 2015-03-02 DIAGNOSIS — M1712 Unilateral primary osteoarthritis, left knee: Secondary | ICD-10-CM | POA: Diagnosis not present

## 2015-03-02 DIAGNOSIS — M7989 Other specified soft tissue disorders: Secondary | ICD-10-CM | POA: Diagnosis not present

## 2015-03-02 DIAGNOSIS — R609 Edema, unspecified: Secondary | ICD-10-CM | POA: Diagnosis not present

## 2015-03-03 DIAGNOSIS — Z96659 Presence of unspecified artificial knee joint: Secondary | ICD-10-CM | POA: Diagnosis not present

## 2015-03-03 DIAGNOSIS — M25612 Stiffness of left shoulder, not elsewhere classified: Secondary | ICD-10-CM | POA: Diagnosis not present

## 2015-03-03 DIAGNOSIS — M25562 Pain in left knee: Secondary | ICD-10-CM | POA: Diagnosis not present

## 2015-03-03 DIAGNOSIS — R262 Difficulty in walking, not elsewhere classified: Secondary | ICD-10-CM | POA: Diagnosis not present

## 2015-03-10 DIAGNOSIS — M25562 Pain in left knee: Secondary | ICD-10-CM | POA: Diagnosis not present

## 2015-03-10 DIAGNOSIS — M25612 Stiffness of left shoulder, not elsewhere classified: Secondary | ICD-10-CM | POA: Diagnosis not present

## 2015-03-10 DIAGNOSIS — M1712 Unilateral primary osteoarthritis, left knee: Secondary | ICD-10-CM | POA: Diagnosis not present

## 2015-03-10 DIAGNOSIS — E119 Type 2 diabetes mellitus without complications: Secondary | ICD-10-CM | POA: Diagnosis not present

## 2015-03-10 DIAGNOSIS — I10 Essential (primary) hypertension: Secondary | ICD-10-CM | POA: Diagnosis not present

## 2015-03-10 DIAGNOSIS — R262 Difficulty in walking, not elsewhere classified: Secondary | ICD-10-CM | POA: Diagnosis not present

## 2015-03-10 DIAGNOSIS — R102 Pelvic and perineal pain: Secondary | ICD-10-CM | POA: Diagnosis not present

## 2015-03-10 DIAGNOSIS — Z96659 Presence of unspecified artificial knee joint: Secondary | ICD-10-CM | POA: Diagnosis not present

## 2015-03-17 DIAGNOSIS — M25612 Stiffness of left shoulder, not elsewhere classified: Secondary | ICD-10-CM | POA: Diagnosis not present

## 2015-03-17 DIAGNOSIS — Z96659 Presence of unspecified artificial knee joint: Secondary | ICD-10-CM | POA: Diagnosis not present

## 2015-03-17 DIAGNOSIS — M25562 Pain in left knee: Secondary | ICD-10-CM | POA: Diagnosis not present

## 2015-03-17 DIAGNOSIS — R262 Difficulty in walking, not elsewhere classified: Secondary | ICD-10-CM | POA: Diagnosis not present

## 2015-03-19 DIAGNOSIS — R262 Difficulty in walking, not elsewhere classified: Secondary | ICD-10-CM | POA: Diagnosis not present

## 2015-03-19 DIAGNOSIS — M25562 Pain in left knee: Secondary | ICD-10-CM | POA: Diagnosis not present

## 2015-03-19 DIAGNOSIS — M25612 Stiffness of left shoulder, not elsewhere classified: Secondary | ICD-10-CM | POA: Diagnosis not present

## 2015-03-19 DIAGNOSIS — Z96659 Presence of unspecified artificial knee joint: Secondary | ICD-10-CM | POA: Diagnosis not present

## 2015-03-30 DIAGNOSIS — M25612 Stiffness of left shoulder, not elsewhere classified: Secondary | ICD-10-CM | POA: Diagnosis not present

## 2015-03-30 DIAGNOSIS — M25562 Pain in left knee: Secondary | ICD-10-CM | POA: Diagnosis not present

## 2015-03-30 DIAGNOSIS — R262 Difficulty in walking, not elsewhere classified: Secondary | ICD-10-CM | POA: Diagnosis not present

## 2015-03-30 DIAGNOSIS — Z96659 Presence of unspecified artificial knee joint: Secondary | ICD-10-CM | POA: Diagnosis not present

## 2015-04-07 DIAGNOSIS — M25612 Stiffness of left shoulder, not elsewhere classified: Secondary | ICD-10-CM | POA: Diagnosis not present

## 2015-04-07 DIAGNOSIS — M25562 Pain in left knee: Secondary | ICD-10-CM | POA: Diagnosis not present

## 2015-04-07 DIAGNOSIS — R262 Difficulty in walking, not elsewhere classified: Secondary | ICD-10-CM | POA: Diagnosis not present

## 2015-04-07 DIAGNOSIS — Z96659 Presence of unspecified artificial knee joint: Secondary | ICD-10-CM | POA: Diagnosis not present

## 2015-04-09 DIAGNOSIS — M25612 Stiffness of left shoulder, not elsewhere classified: Secondary | ICD-10-CM | POA: Diagnosis not present

## 2015-04-09 DIAGNOSIS — R262 Difficulty in walking, not elsewhere classified: Secondary | ICD-10-CM | POA: Diagnosis not present

## 2015-04-09 DIAGNOSIS — Z96659 Presence of unspecified artificial knee joint: Secondary | ICD-10-CM | POA: Diagnosis not present

## 2015-04-09 DIAGNOSIS — M25562 Pain in left knee: Secondary | ICD-10-CM | POA: Diagnosis not present

## 2015-04-14 DIAGNOSIS — Z96659 Presence of unspecified artificial knee joint: Secondary | ICD-10-CM | POA: Diagnosis not present

## 2015-04-14 DIAGNOSIS — R262 Difficulty in walking, not elsewhere classified: Secondary | ICD-10-CM | POA: Diagnosis not present

## 2015-04-14 DIAGNOSIS — M25562 Pain in left knee: Secondary | ICD-10-CM | POA: Diagnosis not present

## 2015-04-14 DIAGNOSIS — M25612 Stiffness of left shoulder, not elsewhere classified: Secondary | ICD-10-CM | POA: Diagnosis not present

## 2015-04-16 DIAGNOSIS — M25612 Stiffness of left shoulder, not elsewhere classified: Secondary | ICD-10-CM | POA: Diagnosis not present

## 2015-04-16 DIAGNOSIS — M25562 Pain in left knee: Secondary | ICD-10-CM | POA: Diagnosis not present

## 2015-04-16 DIAGNOSIS — Z96659 Presence of unspecified artificial knee joint: Secondary | ICD-10-CM | POA: Diagnosis not present

## 2015-04-16 DIAGNOSIS — R262 Difficulty in walking, not elsewhere classified: Secondary | ICD-10-CM | POA: Diagnosis not present

## 2015-06-10 DIAGNOSIS — G4700 Insomnia, unspecified: Secondary | ICD-10-CM | POA: Diagnosis not present

## 2015-06-10 DIAGNOSIS — E119 Type 2 diabetes mellitus without complications: Secondary | ICD-10-CM | POA: Diagnosis not present

## 2015-06-10 DIAGNOSIS — H538 Other visual disturbances: Secondary | ICD-10-CM | POA: Diagnosis not present

## 2015-06-10 DIAGNOSIS — H579 Unspecified disorder of eye and adnexa: Secondary | ICD-10-CM | POA: Diagnosis not present

## 2015-06-11 DIAGNOSIS — H04123 Dry eye syndrome of bilateral lacrimal glands: Secondary | ICD-10-CM | POA: Diagnosis not present

## 2015-07-14 DIAGNOSIS — H5202 Hypermetropia, left eye: Secondary | ICD-10-CM | POA: Diagnosis not present

## 2015-07-14 DIAGNOSIS — H25813 Combined forms of age-related cataract, bilateral: Secondary | ICD-10-CM | POA: Diagnosis not present

## 2015-07-14 DIAGNOSIS — H43813 Vitreous degeneration, bilateral: Secondary | ICD-10-CM | POA: Diagnosis not present

## 2015-07-14 DIAGNOSIS — H52223 Regular astigmatism, bilateral: Secondary | ICD-10-CM | POA: Diagnosis not present

## 2015-07-14 DIAGNOSIS — E119 Type 2 diabetes mellitus without complications: Secondary | ICD-10-CM | POA: Diagnosis not present

## 2015-07-14 DIAGNOSIS — H43313 Vitreous membranes and strands, bilateral: Secondary | ICD-10-CM | POA: Diagnosis not present

## 2015-07-14 DIAGNOSIS — H5211 Myopia, right eye: Secondary | ICD-10-CM | POA: Diagnosis not present

## 2015-07-14 DIAGNOSIS — H524 Presbyopia: Secondary | ICD-10-CM | POA: Diagnosis not present

## 2015-07-14 DIAGNOSIS — Z7984 Long term (current) use of oral hypoglycemic drugs: Secondary | ICD-10-CM | POA: Diagnosis not present

## 2015-07-14 DIAGNOSIS — H43393 Other vitreous opacities, bilateral: Secondary | ICD-10-CM | POA: Diagnosis not present

## 2015-07-14 DIAGNOSIS — I10 Essential (primary) hypertension: Secondary | ICD-10-CM | POA: Diagnosis not present

## 2015-07-14 DIAGNOSIS — H35373 Puckering of macula, bilateral: Secondary | ICD-10-CM | POA: Diagnosis not present

## 2015-07-16 DIAGNOSIS — E119 Type 2 diabetes mellitus without complications: Secondary | ICD-10-CM | POA: Diagnosis not present

## 2015-07-16 DIAGNOSIS — M5136 Other intervertebral disc degeneration, lumbar region: Secondary | ICD-10-CM | POA: Diagnosis not present

## 2015-08-20 DIAGNOSIS — K581 Irritable bowel syndrome with constipation: Secondary | ICD-10-CM | POA: Diagnosis not present

## 2015-08-20 DIAGNOSIS — R1314 Dysphagia, pharyngoesophageal phase: Secondary | ICD-10-CM | POA: Diagnosis not present

## 2015-08-20 DIAGNOSIS — R1013 Epigastric pain: Secondary | ICD-10-CM | POA: Diagnosis not present

## 2015-09-08 DIAGNOSIS — K209 Esophagitis, unspecified: Secondary | ICD-10-CM | POA: Diagnosis not present

## 2015-09-08 DIAGNOSIS — K222 Esophageal obstruction: Secondary | ICD-10-CM | POA: Diagnosis not present

## 2015-09-08 DIAGNOSIS — G4700 Insomnia, unspecified: Secondary | ICD-10-CM | POA: Diagnosis not present

## 2015-09-08 DIAGNOSIS — Z79899 Other long term (current) drug therapy: Secondary | ICD-10-CM | POA: Diagnosis not present

## 2015-09-08 DIAGNOSIS — J449 Chronic obstructive pulmonary disease, unspecified: Secondary | ICD-10-CM | POA: Diagnosis not present

## 2015-09-08 DIAGNOSIS — K219 Gastro-esophageal reflux disease without esophagitis: Secondary | ICD-10-CM | POA: Diagnosis not present

## 2015-09-08 DIAGNOSIS — M1990 Unspecified osteoarthritis, unspecified site: Secondary | ICD-10-CM | POA: Diagnosis not present

## 2015-09-08 DIAGNOSIS — I10 Essential (primary) hypertension: Secondary | ICD-10-CM | POA: Diagnosis not present

## 2015-09-08 DIAGNOSIS — Q393 Congenital stenosis and stricture of esophagus: Secondary | ICD-10-CM | POA: Diagnosis not present

## 2015-09-08 DIAGNOSIS — J45909 Unspecified asthma, uncomplicated: Secondary | ICD-10-CM | POA: Diagnosis not present

## 2015-09-08 DIAGNOSIS — E119 Type 2 diabetes mellitus without complications: Secondary | ICD-10-CM | POA: Diagnosis not present

## 2015-09-08 DIAGNOSIS — R1314 Dysphagia, pharyngoesophageal phase: Secondary | ICD-10-CM | POA: Diagnosis not present

## 2015-09-08 DIAGNOSIS — F329 Major depressive disorder, single episode, unspecified: Secondary | ICD-10-CM | POA: Diagnosis not present

## 2015-09-08 DIAGNOSIS — F419 Anxiety disorder, unspecified: Secondary | ICD-10-CM | POA: Diagnosis not present

## 2015-09-08 DIAGNOSIS — K449 Diaphragmatic hernia without obstruction or gangrene: Secondary | ICD-10-CM | POA: Diagnosis not present

## 2015-09-08 DIAGNOSIS — F17210 Nicotine dependence, cigarettes, uncomplicated: Secondary | ICD-10-CM | POA: Diagnosis not present

## 2015-09-08 DIAGNOSIS — K581 Irritable bowel syndrome with constipation: Secondary | ICD-10-CM | POA: Diagnosis not present

## 2015-09-08 HISTORY — PX: ESOPHAGOGASTRODUODENOSCOPY: SHX1529

## 2015-09-17 DIAGNOSIS — M19012 Primary osteoarthritis, left shoulder: Secondary | ICD-10-CM | POA: Diagnosis not present

## 2015-09-17 DIAGNOSIS — M25512 Pain in left shoulder: Secondary | ICD-10-CM | POA: Diagnosis not present

## 2015-09-27 DIAGNOSIS — M79602 Pain in left arm: Secondary | ICD-10-CM

## 2015-09-27 DIAGNOSIS — M25512 Pain in left shoulder: Secondary | ICD-10-CM | POA: Diagnosis not present

## 2015-09-27 DIAGNOSIS — G8929 Other chronic pain: Secondary | ICD-10-CM | POA: Insufficient documentation

## 2015-09-27 HISTORY — DX: Other chronic pain: G89.29

## 2015-09-27 HISTORY — DX: Pain in left arm: M79.602

## 2015-10-20 DIAGNOSIS — E119 Type 2 diabetes mellitus without complications: Secondary | ICD-10-CM | POA: Diagnosis not present

## 2015-10-20 DIAGNOSIS — J45901 Unspecified asthma with (acute) exacerbation: Secondary | ICD-10-CM | POA: Diagnosis not present

## 2015-10-20 DIAGNOSIS — E7800 Pure hypercholesterolemia, unspecified: Secondary | ICD-10-CM | POA: Diagnosis not present

## 2015-11-18 DIAGNOSIS — M542 Cervicalgia: Secondary | ICD-10-CM | POA: Diagnosis not present

## 2015-11-18 DIAGNOSIS — S199XXA Unspecified injury of neck, initial encounter: Secondary | ICD-10-CM | POA: Diagnosis not present

## 2015-11-18 DIAGNOSIS — S161XXA Strain of muscle, fascia and tendon at neck level, initial encounter: Secondary | ICD-10-CM | POA: Diagnosis not present

## 2015-11-22 DIAGNOSIS — M25551 Pain in right hip: Secondary | ICD-10-CM

## 2015-11-22 DIAGNOSIS — M1611 Unilateral primary osteoarthritis, right hip: Secondary | ICD-10-CM | POA: Diagnosis not present

## 2015-11-22 HISTORY — DX: Pain in right hip: M25.551

## 2016-01-17 DIAGNOSIS — D509 Iron deficiency anemia, unspecified: Secondary | ICD-10-CM | POA: Diagnosis not present

## 2016-01-18 DIAGNOSIS — D509 Iron deficiency anemia, unspecified: Secondary | ICD-10-CM | POA: Diagnosis not present

## 2016-01-26 DIAGNOSIS — E119 Type 2 diabetes mellitus without complications: Secondary | ICD-10-CM | POA: Diagnosis not present

## 2016-02-18 DIAGNOSIS — J189 Pneumonia, unspecified organism: Secondary | ICD-10-CM | POA: Diagnosis not present

## 2016-04-07 DIAGNOSIS — R51 Headache: Secondary | ICD-10-CM | POA: Diagnosis not present

## 2016-04-07 DIAGNOSIS — H538 Other visual disturbances: Secondary | ICD-10-CM | POA: Diagnosis not present

## 2016-04-11 DIAGNOSIS — G459 Transient cerebral ischemic attack, unspecified: Secondary | ICD-10-CM | POA: Diagnosis not present

## 2016-04-11 DIAGNOSIS — R2681 Unsteadiness on feet: Secondary | ICD-10-CM | POA: Diagnosis not present

## 2016-04-11 DIAGNOSIS — H538 Other visual disturbances: Secondary | ICD-10-CM | POA: Diagnosis not present

## 2016-04-11 DIAGNOSIS — G44009 Cluster headache syndrome, unspecified, not intractable: Secondary | ICD-10-CM | POA: Diagnosis not present

## 2016-04-14 DIAGNOSIS — H538 Other visual disturbances: Secondary | ICD-10-CM | POA: Diagnosis not present

## 2016-04-14 DIAGNOSIS — G319 Degenerative disease of nervous system, unspecified: Secondary | ICD-10-CM | POA: Diagnosis not present

## 2016-04-14 DIAGNOSIS — D320 Benign neoplasm of cerebral meninges: Secondary | ICD-10-CM | POA: Diagnosis not present

## 2016-04-14 DIAGNOSIS — R51 Headache: Secondary | ICD-10-CM | POA: Diagnosis not present

## 2016-05-09 DIAGNOSIS — G4452 New daily persistent headache (NDPH): Secondary | ICD-10-CM | POA: Diagnosis not present

## 2016-05-17 DIAGNOSIS — G4452 New daily persistent headache (NDPH): Secondary | ICD-10-CM | POA: Diagnosis not present

## 2016-06-23 DIAGNOSIS — M549 Dorsalgia, unspecified: Secondary | ICD-10-CM | POA: Diagnosis not present

## 2016-07-07 DIAGNOSIS — R51 Headache: Secondary | ICD-10-CM | POA: Diagnosis not present

## 2016-07-11 DIAGNOSIS — J069 Acute upper respiratory infection, unspecified: Secondary | ICD-10-CM | POA: Diagnosis not present

## 2016-07-11 DIAGNOSIS — M545 Low back pain: Secondary | ICD-10-CM | POA: Diagnosis not present

## 2016-07-27 DIAGNOSIS — R200 Anesthesia of skin: Secondary | ICD-10-CM | POA: Diagnosis not present

## 2016-07-27 DIAGNOSIS — E669 Obesity, unspecified: Secondary | ICD-10-CM | POA: Diagnosis not present

## 2016-07-27 DIAGNOSIS — M545 Low back pain: Secondary | ICD-10-CM | POA: Diagnosis not present

## 2016-07-27 DIAGNOSIS — I10 Essential (primary) hypertension: Secondary | ICD-10-CM | POA: Diagnosis not present

## 2016-07-27 DIAGNOSIS — Z720 Tobacco use: Secondary | ICD-10-CM | POA: Diagnosis not present

## 2016-07-27 DIAGNOSIS — Z6830 Body mass index (BMI) 30.0-30.9, adult: Secondary | ICD-10-CM | POA: Diagnosis not present

## 2016-08-09 DIAGNOSIS — M5136 Other intervertebral disc degeneration, lumbar region: Secondary | ICD-10-CM | POA: Diagnosis not present

## 2016-08-17 DIAGNOSIS — M5136 Other intervertebral disc degeneration, lumbar region: Secondary | ICD-10-CM | POA: Diagnosis not present

## 2016-08-17 DIAGNOSIS — M48061 Spinal stenosis, lumbar region without neurogenic claudication: Secondary | ICD-10-CM | POA: Diagnosis not present

## 2016-09-12 DIAGNOSIS — M5136 Other intervertebral disc degeneration, lumbar region: Secondary | ICD-10-CM | POA: Diagnosis not present

## 2016-10-03 DIAGNOSIS — M5136 Other intervertebral disc degeneration, lumbar region: Secondary | ICD-10-CM | POA: Diagnosis not present

## 2016-10-20 DIAGNOSIS — M5136 Other intervertebral disc degeneration, lumbar region: Secondary | ICD-10-CM | POA: Diagnosis not present

## 2016-10-30 DIAGNOSIS — E785 Hyperlipidemia, unspecified: Secondary | ICD-10-CM | POA: Diagnosis not present

## 2016-10-30 DIAGNOSIS — I10 Essential (primary) hypertension: Secondary | ICD-10-CM | POA: Diagnosis not present

## 2016-10-30 DIAGNOSIS — E7800 Pure hypercholesterolemia, unspecified: Secondary | ICD-10-CM | POA: Diagnosis not present

## 2016-10-30 DIAGNOSIS — R7303 Prediabetes: Secondary | ICD-10-CM | POA: Diagnosis not present

## 2016-10-30 DIAGNOSIS — F411 Generalized anxiety disorder: Secondary | ICD-10-CM | POA: Diagnosis not present

## 2016-11-14 DIAGNOSIS — B373 Candidiasis of vulva and vagina: Secondary | ICD-10-CM | POA: Diagnosis not present

## 2016-11-19 DIAGNOSIS — R1012 Left upper quadrant pain: Secondary | ICD-10-CM | POA: Diagnosis not present

## 2016-11-19 DIAGNOSIS — K5730 Diverticulosis of large intestine without perforation or abscess without bleeding: Secondary | ICD-10-CM | POA: Diagnosis not present

## 2016-11-19 DIAGNOSIS — R1032 Left lower quadrant pain: Secondary | ICD-10-CM | POA: Diagnosis not present

## 2016-11-19 DIAGNOSIS — R102 Pelvic and perineal pain: Secondary | ICD-10-CM | POA: Diagnosis not present

## 2016-12-15 ENCOUNTER — Other Ambulatory Visit: Payer: Self-pay

## 2016-12-19 DIAGNOSIS — K581 Irritable bowel syndrome with constipation: Secondary | ICD-10-CM | POA: Diagnosis not present

## 2016-12-19 DIAGNOSIS — R102 Pelvic and perineal pain: Secondary | ICD-10-CM | POA: Diagnosis not present

## 2016-12-19 DIAGNOSIS — Z86010 Personal history of colonic polyps: Secondary | ICD-10-CM | POA: Diagnosis not present

## 2016-12-24 DIAGNOSIS — R300 Dysuria: Secondary | ICD-10-CM | POA: Diagnosis not present

## 2016-12-24 DIAGNOSIS — R350 Frequency of micturition: Secondary | ICD-10-CM | POA: Diagnosis not present

## 2016-12-24 DIAGNOSIS — R3915 Urgency of urination: Secondary | ICD-10-CM | POA: Diagnosis not present

## 2016-12-24 DIAGNOSIS — R1031 Right lower quadrant pain: Secondary | ICD-10-CM | POA: Diagnosis not present

## 2016-12-24 DIAGNOSIS — Z87442 Personal history of urinary calculi: Secondary | ICD-10-CM | POA: Diagnosis not present

## 2016-12-26 DIAGNOSIS — R300 Dysuria: Secondary | ICD-10-CM | POA: Diagnosis not present

## 2017-01-12 DIAGNOSIS — N3000 Acute cystitis without hematuria: Secondary | ICD-10-CM | POA: Diagnosis not present

## 2017-01-12 DIAGNOSIS — R300 Dysuria: Secondary | ICD-10-CM | POA: Diagnosis not present

## 2017-01-25 DIAGNOSIS — N3020 Other chronic cystitis without hematuria: Secondary | ICD-10-CM | POA: Diagnosis not present

## 2017-01-25 DIAGNOSIS — R300 Dysuria: Secondary | ICD-10-CM | POA: Diagnosis not present

## 2017-01-25 DIAGNOSIS — N3090 Cystitis, unspecified without hematuria: Secondary | ICD-10-CM | POA: Diagnosis not present

## 2017-01-25 DIAGNOSIS — N200 Calculus of kidney: Secondary | ICD-10-CM | POA: Diagnosis not present

## 2017-01-26 DIAGNOSIS — M5136 Other intervertebral disc degeneration, lumbar region: Secondary | ICD-10-CM | POA: Diagnosis not present

## 2017-02-05 DIAGNOSIS — J441 Chronic obstructive pulmonary disease with (acute) exacerbation: Secondary | ICD-10-CM | POA: Diagnosis not present

## 2017-02-05 DIAGNOSIS — J209 Acute bronchitis, unspecified: Secondary | ICD-10-CM | POA: Diagnosis not present

## 2017-03-01 DIAGNOSIS — M545 Low back pain: Secondary | ICD-10-CM | POA: Diagnosis not present

## 2017-03-01 DIAGNOSIS — Z96652 Presence of left artificial knee joint: Secondary | ICD-10-CM | POA: Diagnosis not present

## 2017-03-01 DIAGNOSIS — Z471 Aftercare following joint replacement surgery: Secondary | ICD-10-CM | POA: Diagnosis not present

## 2017-03-01 DIAGNOSIS — M6281 Muscle weakness (generalized): Secondary | ICD-10-CM | POA: Diagnosis not present

## 2017-03-01 DIAGNOSIS — M79605 Pain in left leg: Secondary | ICD-10-CM | POA: Diagnosis not present

## 2017-03-01 DIAGNOSIS — G8929 Other chronic pain: Secondary | ICD-10-CM | POA: Diagnosis not present

## 2017-03-02 DIAGNOSIS — M5136 Other intervertebral disc degeneration, lumbar region: Secondary | ICD-10-CM | POA: Diagnosis not present

## 2017-03-16 DIAGNOSIS — M5136 Other intervertebral disc degeneration, lumbar region: Secondary | ICD-10-CM | POA: Diagnosis not present

## 2017-05-17 DIAGNOSIS — M5136 Other intervertebral disc degeneration, lumbar region: Secondary | ICD-10-CM | POA: Diagnosis not present

## 2017-05-28 DIAGNOSIS — M5136 Other intervertebral disc degeneration, lumbar region: Secondary | ICD-10-CM | POA: Diagnosis not present

## 2017-06-04 DIAGNOSIS — Z79899 Other long term (current) drug therapy: Secondary | ICD-10-CM | POA: Insufficient documentation

## 2017-06-04 HISTORY — DX: Other long term (current) drug therapy: Z79.899

## 2017-06-06 DIAGNOSIS — I10 Essential (primary) hypertension: Secondary | ICD-10-CM | POA: Diagnosis not present

## 2017-06-25 DIAGNOSIS — M5136 Other intervertebral disc degeneration, lumbar region: Secondary | ICD-10-CM | POA: Diagnosis not present

## 2017-06-25 DIAGNOSIS — Z79891 Long term (current) use of opiate analgesic: Secondary | ICD-10-CM | POA: Insufficient documentation

## 2017-06-25 DIAGNOSIS — G894 Chronic pain syndrome: Secondary | ICD-10-CM | POA: Diagnosis not present

## 2017-06-25 DIAGNOSIS — M545 Low back pain: Secondary | ICD-10-CM | POA: Diagnosis not present

## 2017-06-25 HISTORY — DX: Long term (current) use of opiate analgesic: Z79.891

## 2017-08-22 DIAGNOSIS — G894 Chronic pain syndrome: Secondary | ICD-10-CM | POA: Diagnosis not present

## 2017-08-22 DIAGNOSIS — Z79891 Long term (current) use of opiate analgesic: Secondary | ICD-10-CM | POA: Diagnosis not present

## 2017-09-26 DIAGNOSIS — M159 Polyosteoarthritis, unspecified: Secondary | ICD-10-CM | POA: Diagnosis not present

## 2017-09-26 DIAGNOSIS — M5414 Radiculopathy, thoracic region: Secondary | ICD-10-CM | POA: Diagnosis not present

## 2017-09-28 DIAGNOSIS — M159 Polyosteoarthritis, unspecified: Secondary | ICD-10-CM | POA: Insufficient documentation

## 2017-09-28 HISTORY — DX: Polyosteoarthritis, unspecified: M15.9

## 2017-10-02 DIAGNOSIS — M159 Polyosteoarthritis, unspecified: Secondary | ICD-10-CM | POA: Diagnosis not present

## 2017-10-23 DIAGNOSIS — R079 Chest pain, unspecified: Secondary | ICD-10-CM | POA: Diagnosis not present

## 2017-10-23 DIAGNOSIS — R1030 Lower abdominal pain, unspecified: Secondary | ICD-10-CM | POA: Diagnosis not present

## 2017-10-23 DIAGNOSIS — R1084 Generalized abdominal pain: Secondary | ICD-10-CM | POA: Diagnosis not present

## 2017-10-23 DIAGNOSIS — N200 Calculus of kidney: Secondary | ICD-10-CM | POA: Diagnosis not present

## 2017-10-23 DIAGNOSIS — R0789 Other chest pain: Secondary | ICD-10-CM | POA: Diagnosis not present

## 2017-10-23 DIAGNOSIS — I10 Essential (primary) hypertension: Secondary | ICD-10-CM | POA: Diagnosis not present

## 2017-11-05 DIAGNOSIS — N3000 Acute cystitis without hematuria: Secondary | ICD-10-CM | POA: Diagnosis not present

## 2017-11-05 DIAGNOSIS — N200 Calculus of kidney: Secondary | ICD-10-CM | POA: Diagnosis not present

## 2017-11-21 DIAGNOSIS — G894 Chronic pain syndrome: Secondary | ICD-10-CM | POA: Diagnosis not present

## 2017-11-21 DIAGNOSIS — M5136 Other intervertebral disc degeneration, lumbar region: Secondary | ICD-10-CM | POA: Diagnosis not present

## 2017-11-21 DIAGNOSIS — Z79891 Long term (current) use of opiate analgesic: Secondary | ICD-10-CM | POA: Diagnosis not present

## 2017-11-21 DIAGNOSIS — M545 Low back pain: Secondary | ICD-10-CM | POA: Diagnosis not present

## 2017-11-21 DIAGNOSIS — Z79899 Other long term (current) drug therapy: Secondary | ICD-10-CM | POA: Diagnosis not present

## 2017-11-23 DIAGNOSIS — N201 Calculus of ureter: Secondary | ICD-10-CM | POA: Diagnosis not present

## 2017-11-23 DIAGNOSIS — N3281 Overactive bladder: Secondary | ICD-10-CM | POA: Diagnosis not present

## 2017-11-23 DIAGNOSIS — N3000 Acute cystitis without hematuria: Secondary | ICD-10-CM | POA: Diagnosis not present

## 2017-12-05 DIAGNOSIS — I10 Essential (primary) hypertension: Secondary | ICD-10-CM | POA: Diagnosis not present

## 2017-12-14 DIAGNOSIS — N3020 Other chronic cystitis without hematuria: Secondary | ICD-10-CM | POA: Diagnosis not present

## 2017-12-24 DIAGNOSIS — M542 Cervicalgia: Secondary | ICD-10-CM | POA: Diagnosis not present

## 2017-12-24 DIAGNOSIS — M5030 Other cervical disc degeneration, unspecified cervical region: Secondary | ICD-10-CM | POA: Diagnosis not present

## 2017-12-27 DIAGNOSIS — M329 Systemic lupus erythematosus, unspecified: Secondary | ICD-10-CM

## 2017-12-27 DIAGNOSIS — IMO0002 Reserved for concepts with insufficient information to code with codable children: Secondary | ICD-10-CM

## 2017-12-27 HISTORY — DX: Systemic lupus erythematosus, unspecified: M32.9

## 2017-12-27 HISTORY — DX: Reserved for concepts with insufficient information to code with codable children: IMO0002

## 2018-01-09 DIAGNOSIS — Z840 Family history of diseases of the skin and subcutaneous tissue: Secondary | ICD-10-CM | POA: Diagnosis not present

## 2018-01-09 DIAGNOSIS — M2550 Pain in unspecified joint: Secondary | ICD-10-CM | POA: Diagnosis not present

## 2018-01-09 DIAGNOSIS — M5136 Other intervertebral disc degeneration, lumbar region: Secondary | ICD-10-CM | POA: Diagnosis not present

## 2018-01-09 DIAGNOSIS — G894 Chronic pain syndrome: Secondary | ICD-10-CM | POA: Diagnosis not present

## 2018-01-09 DIAGNOSIS — M5030 Other cervical disc degeneration, unspecified cervical region: Secondary | ICD-10-CM | POA: Diagnosis not present

## 2018-01-09 DIAGNOSIS — M150 Primary generalized (osteo)arthritis: Secondary | ICD-10-CM | POA: Diagnosis not present

## 2018-01-16 DIAGNOSIS — R6889 Other general symptoms and signs: Secondary | ICD-10-CM | POA: Diagnosis not present

## 2018-01-16 DIAGNOSIS — G894 Chronic pain syndrome: Secondary | ICD-10-CM | POA: Diagnosis not present

## 2018-03-05 DIAGNOSIS — R6889 Other general symptoms and signs: Secondary | ICD-10-CM | POA: Diagnosis not present

## 2018-03-13 DIAGNOSIS — R51 Headache: Secondary | ICD-10-CM | POA: Diagnosis not present

## 2018-03-22 DIAGNOSIS — R51 Headache: Secondary | ICD-10-CM | POA: Diagnosis not present

## 2018-03-22 DIAGNOSIS — R6889 Other general symptoms and signs: Secondary | ICD-10-CM | POA: Diagnosis not present

## 2018-04-02 DIAGNOSIS — G894 Chronic pain syndrome: Secondary | ICD-10-CM | POA: Diagnosis not present

## 2018-04-02 DIAGNOSIS — Z79899 Other long term (current) drug therapy: Secondary | ICD-10-CM | POA: Diagnosis not present

## 2018-04-02 DIAGNOSIS — Z5181 Encounter for therapeutic drug level monitoring: Secondary | ICD-10-CM | POA: Diagnosis not present

## 2018-04-02 DIAGNOSIS — R6889 Other general symptoms and signs: Secondary | ICD-10-CM | POA: Diagnosis not present

## 2018-05-30 DIAGNOSIS — M2550 Pain in unspecified joint: Secondary | ICD-10-CM | POA: Diagnosis not present

## 2018-05-30 DIAGNOSIS — M5030 Other cervical disc degeneration, unspecified cervical region: Secondary | ICD-10-CM | POA: Diagnosis not present

## 2018-05-30 DIAGNOSIS — M5136 Other intervertebral disc degeneration, lumbar region: Secondary | ICD-10-CM | POA: Diagnosis not present

## 2018-05-30 DIAGNOSIS — L659 Nonscarring hair loss, unspecified: Secondary | ICD-10-CM | POA: Diagnosis not present

## 2018-05-30 DIAGNOSIS — R300 Dysuria: Secondary | ICD-10-CM | POA: Diagnosis not present

## 2018-05-30 DIAGNOSIS — G894 Chronic pain syndrome: Secondary | ICD-10-CM | POA: Diagnosis not present

## 2018-05-30 DIAGNOSIS — R768 Other specified abnormal immunological findings in serum: Secondary | ICD-10-CM | POA: Diagnosis not present

## 2018-05-30 DIAGNOSIS — M150 Primary generalized (osteo)arthritis: Secondary | ICD-10-CM | POA: Diagnosis not present

## 2018-06-19 DIAGNOSIS — R1030 Lower abdominal pain, unspecified: Secondary | ICD-10-CM | POA: Diagnosis not present

## 2018-06-19 DIAGNOSIS — G4700 Insomnia, unspecified: Secondary | ICD-10-CM | POA: Diagnosis not present

## 2018-06-19 DIAGNOSIS — I10 Essential (primary) hypertension: Secondary | ICD-10-CM | POA: Diagnosis not present

## 2018-06-19 DIAGNOSIS — G894 Chronic pain syndrome: Secondary | ICD-10-CM | POA: Diagnosis not present

## 2018-06-19 DIAGNOSIS — J449 Chronic obstructive pulmonary disease, unspecified: Secondary | ICD-10-CM | POA: Diagnosis not present

## 2018-06-19 DIAGNOSIS — R6889 Other general symptoms and signs: Secondary | ICD-10-CM | POA: Diagnosis not present

## 2018-07-04 DIAGNOSIS — G894 Chronic pain syndrome: Secondary | ICD-10-CM | POA: Diagnosis not present

## 2018-07-04 DIAGNOSIS — R6889 Other general symptoms and signs: Secondary | ICD-10-CM | POA: Diagnosis not present

## 2018-07-09 DIAGNOSIS — G894 Chronic pain syndrome: Secondary | ICD-10-CM | POA: Diagnosis not present

## 2018-07-09 DIAGNOSIS — Z79891 Long term (current) use of opiate analgesic: Secondary | ICD-10-CM | POA: Diagnosis not present

## 2018-07-09 DIAGNOSIS — R6889 Other general symptoms and signs: Secondary | ICD-10-CM | POA: Diagnosis not present

## 2018-07-09 DIAGNOSIS — M159 Polyosteoarthritis, unspecified: Secondary | ICD-10-CM | POA: Diagnosis not present

## 2018-07-09 DIAGNOSIS — M545 Low back pain: Secondary | ICD-10-CM | POA: Diagnosis not present

## 2018-07-09 DIAGNOSIS — M5134 Other intervertebral disc degeneration, thoracic region: Secondary | ICD-10-CM | POA: Diagnosis not present

## 2018-09-02 DIAGNOSIS — L0390 Cellulitis, unspecified: Secondary | ICD-10-CM | POA: Diagnosis not present

## 2018-09-02 DIAGNOSIS — E119 Type 2 diabetes mellitus without complications: Secondary | ICD-10-CM | POA: Diagnosis not present

## 2018-09-02 DIAGNOSIS — L0291 Cutaneous abscess, unspecified: Secondary | ICD-10-CM | POA: Diagnosis not present

## 2018-09-30 DIAGNOSIS — G894 Chronic pain syndrome: Secondary | ICD-10-CM | POA: Diagnosis not present

## 2018-09-30 DIAGNOSIS — M542 Cervicalgia: Secondary | ICD-10-CM | POA: Diagnosis not present

## 2018-10-25 DIAGNOSIS — R51 Headache: Secondary | ICD-10-CM | POA: Diagnosis not present

## 2018-10-25 DIAGNOSIS — R062 Wheezing: Secondary | ICD-10-CM | POA: Diagnosis not present

## 2018-10-25 DIAGNOSIS — R05 Cough: Secondary | ICD-10-CM | POA: Diagnosis not present

## 2018-10-25 DIAGNOSIS — J029 Acute pharyngitis, unspecified: Secondary | ICD-10-CM | POA: Diagnosis not present

## 2018-10-25 DIAGNOSIS — R0602 Shortness of breath: Secondary | ICD-10-CM | POA: Diagnosis not present

## 2018-11-25 ENCOUNTER — Encounter: Payer: Self-pay | Admitting: *Deleted

## 2018-11-28 ENCOUNTER — Ambulatory Visit: Payer: Medicare Other | Admitting: Cardiology

## 2018-12-10 ENCOUNTER — Ambulatory Visit: Payer: Medicare Other | Admitting: Cardiology

## 2018-12-16 ENCOUNTER — Ambulatory Visit: Payer: Medicare Other | Admitting: Cardiology

## 2018-12-24 DIAGNOSIS — I10 Essential (primary) hypertension: Secondary | ICD-10-CM | POA: Diagnosis not present

## 2018-12-24 DIAGNOSIS — E119 Type 2 diabetes mellitus without complications: Secondary | ICD-10-CM | POA: Diagnosis not present

## 2018-12-24 DIAGNOSIS — R0600 Dyspnea, unspecified: Secondary | ICD-10-CM | POA: Diagnosis not present

## 2019-01-19 DIAGNOSIS — J449 Chronic obstructive pulmonary disease, unspecified: Secondary | ICD-10-CM | POA: Diagnosis not present

## 2019-01-28 ENCOUNTER — Other Ambulatory Visit (HOSPITAL_BASED_OUTPATIENT_CLINIC_OR_DEPARTMENT_OTHER): Payer: Self-pay | Admitting: Internal Medicine

## 2019-01-28 ENCOUNTER — Ambulatory Visit: Payer: Medicare Other | Admitting: Cardiology

## 2019-01-28 DIAGNOSIS — Z1231 Encounter for screening mammogram for malignant neoplasm of breast: Secondary | ICD-10-CM

## 2019-02-04 ENCOUNTER — Inpatient Hospital Stay (HOSPITAL_BASED_OUTPATIENT_CLINIC_OR_DEPARTMENT_OTHER): Admission: RE | Admit: 2019-02-04 | Payer: Medicare Other | Source: Ambulatory Visit

## 2019-02-04 DIAGNOSIS — M5136 Other intervertebral disc degeneration, lumbar region: Secondary | ICD-10-CM | POA: Diagnosis not present

## 2019-02-04 DIAGNOSIS — M5030 Other cervical disc degeneration, unspecified cervical region: Secondary | ICD-10-CM | POA: Diagnosis not present

## 2019-02-04 DIAGNOSIS — M5134 Other intervertebral disc degeneration, thoracic region: Secondary | ICD-10-CM | POA: Diagnosis not present

## 2019-02-04 DIAGNOSIS — Z79899 Other long term (current) drug therapy: Secondary | ICD-10-CM | POA: Diagnosis not present

## 2019-02-04 DIAGNOSIS — G894 Chronic pain syndrome: Secondary | ICD-10-CM | POA: Diagnosis not present

## 2019-02-04 DIAGNOSIS — M545 Low back pain: Secondary | ICD-10-CM | POA: Diagnosis not present

## 2019-02-04 DIAGNOSIS — R6889 Other general symptoms and signs: Secondary | ICD-10-CM | POA: Diagnosis not present

## 2019-02-07 DIAGNOSIS — S2241XA Multiple fractures of ribs, right side, initial encounter for closed fracture: Secondary | ICD-10-CM | POA: Diagnosis not present

## 2019-02-07 DIAGNOSIS — S8001XA Contusion of right knee, initial encounter: Secondary | ICD-10-CM | POA: Diagnosis not present

## 2019-02-07 DIAGNOSIS — M25561 Pain in right knee: Secondary | ICD-10-CM | POA: Diagnosis not present

## 2019-02-07 DIAGNOSIS — R609 Edema, unspecified: Secondary | ICD-10-CM | POA: Diagnosis not present

## 2019-02-07 DIAGNOSIS — I10 Essential (primary) hypertension: Secondary | ICD-10-CM | POA: Diagnosis not present

## 2019-02-07 DIAGNOSIS — S82001A Unspecified fracture of right patella, initial encounter for closed fracture: Secondary | ICD-10-CM | POA: Diagnosis not present

## 2019-02-07 DIAGNOSIS — R52 Pain, unspecified: Secondary | ICD-10-CM | POA: Diagnosis not present

## 2019-02-07 DIAGNOSIS — W19XXXA Unspecified fall, initial encounter: Secondary | ICD-10-CM | POA: Diagnosis not present

## 2019-02-07 DIAGNOSIS — S2242XA Multiple fractures of ribs, left side, initial encounter for closed fracture: Secondary | ICD-10-CM | POA: Diagnosis not present

## 2019-02-10 DIAGNOSIS — R0781 Pleurodynia: Secondary | ICD-10-CM | POA: Diagnosis not present

## 2019-02-10 DIAGNOSIS — S82034A Nondisplaced transverse fracture of right patella, initial encounter for closed fracture: Secondary | ICD-10-CM | POA: Diagnosis not present

## 2019-02-14 DIAGNOSIS — R6889 Other general symptoms and signs: Secondary | ICD-10-CM | POA: Diagnosis not present

## 2019-02-14 DIAGNOSIS — M25561 Pain in right knee: Secondary | ICD-10-CM | POA: Diagnosis not present

## 2019-02-24 DIAGNOSIS — R6889 Other general symptoms and signs: Secondary | ICD-10-CM | POA: Diagnosis not present

## 2019-02-24 DIAGNOSIS — S2239XA Fracture of one rib, unspecified side, initial encounter for closed fracture: Secondary | ICD-10-CM | POA: Diagnosis not present

## 2019-02-27 DIAGNOSIS — R6889 Other general symptoms and signs: Secondary | ICD-10-CM | POA: Diagnosis not present

## 2019-02-27 DIAGNOSIS — S82034A Nondisplaced transverse fracture of right patella, initial encounter for closed fracture: Secondary | ICD-10-CM | POA: Diagnosis not present

## 2019-03-05 ENCOUNTER — Encounter: Payer: Self-pay | Admitting: Gastroenterology

## 2019-03-12 DIAGNOSIS — R6889 Other general symptoms and signs: Secondary | ICD-10-CM | POA: Diagnosis not present

## 2019-03-18 ENCOUNTER — Ambulatory Visit: Payer: Medicare Other | Admitting: Gastroenterology

## 2019-03-27 ENCOUNTER — Ambulatory Visit: Payer: Medicare Other | Admitting: Gastroenterology

## 2019-04-11 DIAGNOSIS — J449 Chronic obstructive pulmonary disease, unspecified: Secondary | ICD-10-CM | POA: Diagnosis not present

## 2019-04-11 DIAGNOSIS — E1169 Type 2 diabetes mellitus with other specified complication: Secondary | ICD-10-CM | POA: Diagnosis not present

## 2019-04-11 DIAGNOSIS — E782 Mixed hyperlipidemia: Secondary | ICD-10-CM | POA: Diagnosis not present

## 2019-04-11 DIAGNOSIS — G894 Chronic pain syndrome: Secondary | ICD-10-CM | POA: Diagnosis not present

## 2019-04-11 DIAGNOSIS — I10 Essential (primary) hypertension: Secondary | ICD-10-CM | POA: Diagnosis not present

## 2019-04-11 DIAGNOSIS — R6889 Other general symptoms and signs: Secondary | ICD-10-CM | POA: Diagnosis not present

## 2019-04-16 DIAGNOSIS — S2242XD Multiple fractures of ribs, left side, subsequent encounter for fracture with routine healing: Secondary | ICD-10-CM | POA: Diagnosis not present

## 2019-06-03 DIAGNOSIS — R6889 Other general symptoms and signs: Secondary | ICD-10-CM | POA: Diagnosis not present

## 2019-06-03 DIAGNOSIS — M545 Low back pain: Secondary | ICD-10-CM | POA: Diagnosis not present

## 2019-06-03 DIAGNOSIS — G894 Chronic pain syndrome: Secondary | ICD-10-CM | POA: Diagnosis not present

## 2019-06-03 DIAGNOSIS — M5416 Radiculopathy, lumbar region: Secondary | ICD-10-CM | POA: Diagnosis not present

## 2019-06-03 DIAGNOSIS — M159 Polyosteoarthritis, unspecified: Secondary | ICD-10-CM | POA: Diagnosis not present

## 2019-06-10 DIAGNOSIS — R6889 Other general symptoms and signs: Secondary | ICD-10-CM | POA: Diagnosis not present

## 2019-06-10 DIAGNOSIS — M5124 Other intervertebral disc displacement, thoracic region: Secondary | ICD-10-CM | POA: Diagnosis not present

## 2019-06-10 DIAGNOSIS — M5126 Other intervertebral disc displacement, lumbar region: Secondary | ICD-10-CM | POA: Diagnosis not present

## 2019-06-10 DIAGNOSIS — M48061 Spinal stenosis, lumbar region without neurogenic claudication: Secondary | ICD-10-CM | POA: Diagnosis not present

## 2019-06-10 DIAGNOSIS — M5416 Radiculopathy, lumbar region: Secondary | ICD-10-CM | POA: Diagnosis not present

## 2019-07-31 ENCOUNTER — Ambulatory Visit: Payer: Medicare Other | Admitting: Legal Medicine

## 2019-08-07 ENCOUNTER — Ambulatory Visit (INDEPENDENT_AMBULATORY_CARE_PROVIDER_SITE_OTHER): Payer: Medicare Other | Admitting: Legal Medicine

## 2019-08-07 ENCOUNTER — Encounter: Payer: Self-pay | Admitting: Legal Medicine

## 2019-08-07 ENCOUNTER — Other Ambulatory Visit: Payer: Self-pay

## 2019-08-07 VITALS — BP 164/100 | HR 72 | Temp 97.7°F | Ht 62.0 in | Wt 173.2 lb

## 2019-08-07 DIAGNOSIS — R6889 Other general symptoms and signs: Secondary | ICD-10-CM | POA: Diagnosis not present

## 2019-08-07 DIAGNOSIS — R079 Chest pain, unspecified: Secondary | ICD-10-CM | POA: Diagnosis not present

## 2019-08-07 DIAGNOSIS — E1169 Type 2 diabetes mellitus with other specified complication: Secondary | ICD-10-CM

## 2019-08-07 DIAGNOSIS — I119 Hypertensive heart disease without heart failure: Secondary | ICD-10-CM

## 2019-08-07 DIAGNOSIS — I10 Essential (primary) hypertension: Secondary | ICD-10-CM | POA: Insufficient documentation

## 2019-08-07 DIAGNOSIS — E782 Mixed hyperlipidemia: Secondary | ICD-10-CM | POA: Insufficient documentation

## 2019-08-07 DIAGNOSIS — J432 Centrilobular emphysema: Secondary | ICD-10-CM

## 2019-08-07 DIAGNOSIS — J449 Chronic obstructive pulmonary disease, unspecified: Secondary | ICD-10-CM | POA: Insufficient documentation

## 2019-08-07 DIAGNOSIS — K2100 Gastro-esophageal reflux disease with esophagitis, without bleeding: Secondary | ICD-10-CM | POA: Insufficient documentation

## 2019-08-07 DIAGNOSIS — E785 Hyperlipidemia, unspecified: Secondary | ICD-10-CM | POA: Insufficient documentation

## 2019-08-07 HISTORY — DX: Hypertensive heart disease without heart failure: I11.9

## 2019-08-07 HISTORY — DX: Gastro-esophageal reflux disease with esophagitis, without bleeding: K21.00

## 2019-08-07 HISTORY — DX: Type 2 diabetes mellitus with other specified complication: E11.69

## 2019-08-07 HISTORY — DX: Essential (primary) hypertension: I10

## 2019-08-07 MED ORDER — ALBUTEROL SULFATE HFA 108 (90 BASE) MCG/ACT IN AERS: 2.0000 | INHALATION_SPRAY | Freq: Four times a day (QID) | RESPIRATORY_TRACT | 6 refills | Status: DC | PRN

## 2019-08-07 MED ORDER — BUDESON-GLYCOPYRROL-FORMOTEROL 160-9-4.8 MCG/ACT IN AERO: 2.0000 | INHALATION_SPRAY | Freq: Two times a day (BID) | RESPIRATORY_TRACT | 6 refills | Status: DC

## 2019-08-07 NOTE — Assessment & Plan Note (Addendum)
An individualize plan was formulated for care of COPD.  Treatment is evidence based.  She will continue on inhalers, avoid smoking and smoke.  Regular exercise with help with dyspnea. Routine follow ups and medication compliance is needed. Patient is in poor control.  Breztri added # 2 samples and RX. PFT shows severe obstruction.

## 2019-08-07 NOTE — Progress Notes (Addendum)
Acute Office Visit  Subjective:    Patient ID: [name] [name], female    DOB: 1946-05-04, 74 y.o.   MRN: [id number]  Chief Complaint  Patient presents with  . Flank Pain  . Abdominal Pain  . Chest Pain    HPI Patient is in today for chest pain for months.  DOE with walking and still smoking 1 PPK.  No radiation pain. NO sweats.  Pain lasts 20 minutes.  She is on pain medicines.  She is tired and weak. Legs  Pain with cramptng. Stopped diabetes and cholesterol medicine.  She stopped her inhalers and other medicines.  She stopped her metformin for Dm and statin.  She is at high risk for CAD with smoking, hypercholesterolia, hypertension, diabetes, positive family history. She is dyspneic at rest.  No chest wall pain. We do not have nitroglycerine- out of date.  She refused ER referral.  Check stat troponin T.  Past Medical History:  Diagnosis Date  . Arthritis   . Asthma   . Bowel trouble   . Chronic left shoulder pain 09/27/2015  . Chronic pain in female pelvis   . Colon disorder   . COPD (chronic obstructive pulmonary disease) ([place])   . Depression   . Diabetes mellitus without complication ([place])   . Dyspnea on exertion   . Enlarged aorta ([place])   . Esophageal dysphagia   . Eye problem   . Fibromyalgia   . Frequent headaches   . GERD (gastroesophageal reflux disease)   . Heart disease   . History of colon polyps   . Hyperlipidemia   . Hypertension   . IBS (irritable bowel syndrome)   . Insomnia   . Joint pain   . Lupus ([place]) 12/2017  . Myocardial infarction ([place])    mild-heart attack  pt.living in [place] at the time  . Osteoarthritis   . Pain of right hip joint 11/22/2015  . Primary osteoarthritis of left knee 02/04/2015  . Rash   . S/P total knee arthroplasty 02/15/2015  . Sleep trouble   . SOB (shortness of breath)   . Weakness     Past Surgical History:  Procedure Laterality Date  . ABDOMINAL HYSTERECTOMY    . BREAST BIOPSY Right   . CHOLECYSTECTOMY    .  COLONOSCOPY  09/04/2011   Colonic polyps, status post polyectomy. Incidental small ascending colon lipoma  . ESOPHAGOGASTRODUODENOSCOPY  09/08/2015   Schatzki ring status post esophageal dilitation. Small hiatal hernia  . hemorrhoid surgery    . KNEE SURGERY     left  . TOTAL KNEE ARTHROPLASTY Left 02/15/2015   Procedure: TOTAL KNEE ARTHROPLASTY;  Surgeon: Vickey Huger, MD;  Location: [facility];  Service: Orthopedics;  Laterality: Left;  . TUBAL LIGATION    . WRIST SURGERY     right    Family History  Problem Relation Age of Onset  . Heart disease Mother   . Cancer Father   . Diabetes Father   . Heart disease Father   . Stroke Father   . Seizures Son     Social History   Socioeconomic History  . Marital status: Widowed    Spouse name: Not on file  . Number of children: Not on file  . Years of education: Not on file  . Highest education level: Not on file  Occupational History  . Not on file  Tobacco Use  . Smoking status: Current Every Day Smoker    Packs/day: 1.50  Years: 47.00    Pack years: 70.50    Types: Cigarettes  . Smokeless tobacco: Never Used  Substance and Sexual Activity  . Alcohol use: No  . Drug use: No  . Sexual activity: Not on file  Other Topics Concern  . Not on file  Social History Narrative  . Not on file   Social Determinants of Health   Financial Resource Strain:   . Difficulty of Paying Living Expenses:   Food Insecurity:   . Worried About Charity fundraiser in the Last Year:   . Arboriculturist in the Last Year:   Transportation Needs:   . Film/video editor (Medical):   Marland Kitchen Lack of Transportation (Non-Medical):   Physical Activity:   . Days of Exercise per Week:   . Minutes of Exercise per Session:   Stress:   . Feeling of Stress :   Social Connections:   . Frequency of Communication with Friends and Family:   . Frequency of Social Gatherings with Friends and Family:   . Attends Religious Services:   . Active Member of Clubs  or Organizations:   . Attends Archivist Meetings:   Marland Kitchen Marital Status:   Intimate Partner Violence:   . Fear of Current or Ex-Partner:   . Emotionally Abused:   Marland Kitchen Physically Abused:   . Sexually Abused:     Outpatient Medications Prior to Visit  Medication Sig Dispense Refill  . atorvastatin (LIPITOR) 40 MG tablet     . buPROPion (WELLBUTRIN XL) 150 MG 24 hr tablet Take 150 mg by mouth daily.    . diclofenac (VOLTAREN) 75 MG EC tablet Take 75 mg by mouth 2 (two) times daily.    Marland Kitchen ibuprofen (ADVIL) 600 MG tablet ibuprofen 600 mg tablet  TK 1 T PO TID    . lisinopril-hydrochlorothiazide (PRINZIDE,ZESTORETIC) 20-12.5 MG per tablet Take 2 tablets by mouth daily.    . methocarbamol (ROBAXIN) 500 MG tablet Take 1-2 tablets (500-1,000 mg total) by mouth every 6 (six) hours as needed for muscle spasms. 60 tablet 2  . naloxone (NARCAN) 4 MG/0.1ML LIQD nasal spray kit Narcan 4 mg/actuation nasal spray  Take by nasal route every 3 minutes until patient awakes or EMS arrives.    Marland Kitchen omeprazole (PRILOSEC) 40 MG capsule Take 40 mg by mouth daily as needed.     Marland Kitchen oxyCODONE-acetaminophen (PERCOCET) 10-325 MG tablet Take 1 tablet by mouth every 6 (six) hours as needed.    . traZODone (DESYREL) 100 MG tablet Take 100 mg by mouth at bedtime.    Marland Kitchen albuterol (PROVENTIL HFA;VENTOLIN HFA) 108 (90 BASE) MCG/ACT inhaler Inhale into the lungs every 6 (six) hours as needed for wheezing or shortness of breath.    . Budeson-Glycopyrrol-Formoterol 160-9-4.8 MCG/ACT AERO Inhale 2 puffs into the lungs in the morning and at bedtime.    . celecoxib (CELEBREX) 200 MG capsule Take 200 mg by mouth daily.    . cetirizine (ZYRTEC) 10 MG tablet Take 10 mg by mouth daily.    Marland Kitchen enoxaparin (LOVENOX) 40 MG/0.4ML injection Inject 0.4 mLs (40 mg total) into the skin daily. 12 Syringe 0  . furosemide (LASIX) 40 MG tablet Take 40 mg by mouth daily as needed for edema.     Marland Kitchen ipratropium (ATROVENT) 0.02 % nebulizer solution  Take 0.5 mg by nebulization every 8 (eight) hours as needed for wheezing or shortness of breath.    . lovastatin (MEVACOR) 40 MG tablet Take 40  mg by mouth at bedtime.    . metFORMIN (GLUCOPHAGE) 500 MG tablet Take 500 mg by mouth at bedtime.     . metoCLOPramide (REGLAN) 5 MG tablet Take 1-2 tablets (5-10 mg total) by mouth every 8 (eight) hours as needed for nausea (if ondansetron (ZOFRAN) ineffective.). 30 tablet 1  . Milnacipran (SAVELLA) 50 MG TABS tablet Take 50 mg by mouth 2 (two) times daily.    . montelukast (SINGULAIR) 10 MG tablet Take 10 mg by mouth at bedtime.    . nitroGLYCERIN (NITROSTAT) 0.4 MG SL tablet Place 0.4 mg under the tongue every 5 (five) minutes as needed for chest pain.    Marland Kitchen oxyCODONE (OXY IR/ROXICODONE) 5 MG immediate release tablet Take 1-2 tablets (5-10 mg total) by mouth every 3 (three) hours as needed for breakthrough pain. 90 tablet 0  . OxyCODONE (OXYCONTIN) 10 mg T12A 12 hr tablet Take 1 tablet (10 mg total) by mouth every 12 (twelve) hours. 30 tablet 0  . tiotropium (SPIRIVA) 18 MCG inhalation capsule Place 18 mcg into inhaler and inhale daily as needed.      No facility-administered medications prior to visit.    Allergies  Allergen Reactions  . Meperidine Anaphylaxis    Blood pressure dropped  . Influenza Vaccines     Flu like symptoms    Review of Systems  Constitutional: Positive for fatigue.  HENT: Negative.   Eyes: Negative.   Respiratory: Negative.   Cardiovascular: Positive for chest pain.  Gastrointestinal: Negative.   Genitourinary: Negative.   Musculoskeletal: Positive for arthralgias, back pain and myalgias.  Neurological: Negative.   Hematological: Negative.   Psychiatric/Behavioral: The patient is nervous/anxious.        Objective:    Physical Exam Vitals reviewed.  Constitutional:      Appearance: She is well-developed.  HENT:     Head: Normocephalic and atraumatic.  Eyes:     Extraocular Movements: Extraocular  movements intact.     Pupils: Pupils are equal, round, and reactive to light.  Cardiovascular:     Rate and Rhythm: Normal rate and regular rhythm.     Pulses:          Dorsalis pedis pulses are 2+ on the right side and 2+ on the left side.       Posterior tibial pulses are 2+ on the right side and 2+ on the left side.     Heart sounds: Normal heart sounds.  Pulmonary:     Effort: Pulmonary effort is normal.     Breath sounds: Normal breath sounds.  Abdominal:     General: Abdomen is flat and scaphoid. Bowel sounds are increased.     Palpations: Abdomen is soft.     Tenderness: There is no abdominal tenderness.  Musculoskeletal:     Right lower leg: 2+ Pitting Edema present.     Left lower leg: 2+ Pitting Edema present.  Skin:    General: Skin is warm.     Capillary Refill: Capillary refill takes less than 2 seconds.  Neurological:     General: No focal deficit present.     Mental Status: She is alert.   EKG: NSR rate 63, no bocks or extra beats, QRS axis 18 deg QT c409  BP (!) 164/100   Pulse 72   Temp 97.7 F (36.5 C)   Ht '5\' 2"'  (1.575 m)   Wt 173 lb 3.2 oz (78.6 kg)   SpO2 97%   BMI 31.68 kg/m  Wt Readings  from Last 3 Encounters:  08/07/19 173 lb 3.2 oz (78.6 kg)  02/15/15 163 lb 7 oz (74.1 kg)  01/28/15 163 lb 7 oz (74.1 kg)    Health Maintenance Due  Topic Date Due  . Hepatitis C Screening  Never done  . FOOT EXAM  Never done  . OPHTHALMOLOGY EXAM  Never done  . TETANUS/TDAP  Never done  . MAMMOGRAM  Never done  . COLONOSCOPY  Never done  . DEXA SCAN  Never done  . PNA vac Low Risk Adult (1 of 2 - PCV13) Never done  . HEMOGLOBIN A1C  08/15/2015  . INFLUENZA VACCINE  Never done    There are no preventive care reminders to display for this patient.   No results found for: TSH Lab Results  Component Value Date   WBC 8.9 02/17/2015   HGB 11.0 (L) 02/17/2015   HCT 33.6 (L) 02/17/2015   MCV 94.9 02/17/2015   PLT 150 02/17/2015   Lab Results    Component Value Date   NA 131 (L) 02/16/2015   K 3.8 02/16/2015   CO2 26 02/16/2015   GLUCOSE 118 (H) 02/16/2015   BUN 7 02/16/2015   CREATININE 0.55 02/16/2015   BILITOT 0.4 01/28/2015   ALKPHOS 73 01/28/2015   AST 21 01/28/2015   ALT 14 01/28/2015   PROT 6.9 01/28/2015   ALBUMIN 4.1 01/28/2015   CALCIUM 8.3 (L) 02/16/2015   ANIONGAP 6 02/16/2015   No results found for: CHOL No results found for: HDL No results found for: LDLCALC No results found for: TRIG No results found for: CHOLHDL Lab Results  Component Value Date   HGBA1C 5.6 02/15/2015  PFT: FVC 1.31 % FEV1 0.63 % Fwv1/FVC 61%     Assessment & Plan:   Problem List Items Addressed This Visit      Respiratory   COPD (chronic obstructive pulmonary disease) ([place])    An individualize plan was formulated for care of COPD.  Treatment is evidence based.  She will continue on inhalers, avoid smoking and smoke.  Regular exercise with help with dyspnea. Routine follow ups and medication compliance is needed. Patient is in poor control.  Breztri added # 2 samples and RX. PFT shows severe obstruction.      Relevant Medications   albuterol (VENTOLIN HFA) 108 (90 Base) MCG/ACT inhaler   Budeson-Glycopyrrol-Formoterol 160-9-4.8 MCG/ACT AERO     Other   Chest pain at rest - Primary   Relevant Orders   EKG 12-Lead   Ambulatory referral to Cardiology       Meds ordered this encounter  Medications  . albuterol (VENTOLIN HFA) 108 (90 Base) MCG/ACT inhaler    Sig: Inhale 2 puffs into the lungs every 6 (six) hours as needed for wheezing or shortness of breath.    Dispense:  8 g    Refill:  6  . Budeson-Glycopyrrol-Formoterol 160-9-4.8 MCG/ACT AERO    Sig: Inhale 2 puffs into the lungs in the morning and at bedtime.    Dispense:  10.7 g    Refill:  6     [name], MD

## 2019-08-11 ENCOUNTER — Other Ambulatory Visit: Payer: Self-pay

## 2019-08-11 ENCOUNTER — Encounter: Payer: Self-pay | Admitting: Legal Medicine

## 2019-08-11 ENCOUNTER — Ambulatory Visit (INDEPENDENT_AMBULATORY_CARE_PROVIDER_SITE_OTHER): Payer: Medicare Other | Admitting: Legal Medicine

## 2019-08-11 VITALS — BP 196/88 | HR 67 | Temp 97.3°F | Resp 17 | Ht 62.0 in | Wt 168.4 lb

## 2019-08-11 DIAGNOSIS — F320 Major depressive disorder, single episode, mild: Secondary | ICD-10-CM

## 2019-08-11 DIAGNOSIS — I10 Essential (primary) hypertension: Secondary | ICD-10-CM

## 2019-08-11 DIAGNOSIS — J449 Chronic obstructive pulmonary disease, unspecified: Secondary | ICD-10-CM | POA: Diagnosis not present

## 2019-08-11 DIAGNOSIS — E782 Mixed hyperlipidemia: Secondary | ICD-10-CM | POA: Diagnosis not present

## 2019-08-11 DIAGNOSIS — E1169 Type 2 diabetes mellitus with other specified complication: Secondary | ICD-10-CM | POA: Diagnosis not present

## 2019-08-11 MED ORDER — TRIAMCINOLONE ACETONIDE 40 MG/ML IJ SUSP
80.0000 mg | Freq: Once | INTRAMUSCULAR | Status: AC
  Administered 2019-08-11: 80 mg via INTRAMUSCULAR

## 2019-08-11 MED ORDER — SERTRALINE HCL 50 MG PO TABS: 50.0000 mg | ORAL_TABLET | Freq: Every day | ORAL | 3 refills | Status: DC

## 2019-08-11 NOTE — Assessment & Plan Note (Signed)

## 2019-08-11 NOTE — Assessment & Plan Note (Signed)

## 2019-08-11 NOTE — Progress Notes (Signed)
Established Patient Office Visit  Subjective:  Patient ID: [name] [name], female    DOB: 12/02/1945  Age: 74 y.o. MRN: [id number]  CC:  Chief Complaint  Patient presents with  . COPD  . Hypertension  . Hyperlipidemia  . Depression    HPI [name] [name] presents for Chronic visit.  Patient presents with diagnosis of COPD.  It is not secondary to prolonged asthma.  Diagnosis 20  Treatment includes albuterol and breztri.  The diagnosis has not been hospitalized for this diagnosis. Last na.  Patient is  Is compliant with regular use of medicines.  Patient presents for follow up of hypertension.  Patient tolerating lisinopril/ HCTZ well with side effects.  Patient was diagnosed with hypertension 2010 so has been treated for hypertension for 10 years.Patient is working on maintaining diet and exercise regimen and follows up as directed. Complication include none.  Patient presents with hyperlipidemia.  Compliance with treatment has been good; patient takes medicines as directed, maintains low cholesterol diet, follows up as directed, and maintains exercise regimen.  Patient is using atorvastatin without problems.  This patient has major depression for many years, had nervous breakdown in 79s .  PHQ9 =9.  Patient is having more anhedonia.  The patient has less future plans and prospects.  The depression is worse with stress and illness.  The patient is notexercising and working on behavior to improve mental health.  Patient is not seeing a therapist or psychiatrist.  na  Patient is on buproprion.  Past Medical History:  Diagnosis Date  . Arthritis   . Asthma   . Bowel trouble   . Chronic left shoulder pain 09/27/2015  . Chronic pain in female pelvis   . Colon disorder   . COPD (chronic obstructive pulmonary disease) ([place])   . Depression   . Diabetes mellitus without complication ([place])   . Dyspnea on exertion   . Enlarged aorta ([place])   . Esophageal dysphagia   . Eye problem   .  Fibromyalgia   . Frequent headaches   . GERD (gastroesophageal reflux disease)   . Heart disease   . History of colon polyps   . Hyperlipidemia   . Hypertension   . IBS (irritable bowel syndrome)   . Insomnia   . Joint pain   . Lupus ([place]) 12/2017  . Myocardial infarction ([place])    mild-heart attack  pt.living in [place] at the time  . Osteoarthritis   . Pain of right hip joint 11/22/2015  . Primary osteoarthritis of left knee 02/04/2015  . Rash   . S/P total knee arthroplasty 02/15/2015  . Sleep trouble   . SOB (shortness of breath)   . Weakness     Past Surgical History:  Procedure Laterality Date  . ABDOMINAL HYSTERECTOMY    . BREAST BIOPSY Right   . CHOLECYSTECTOMY    . COLONOSCOPY  09/04/2011   Colonic polyps, status post polyectomy. Incidental small ascending colon lipoma  . ESOPHAGOGASTRODUODENOSCOPY  09/08/2015   Schatzki ring status post esophageal dilitation. Small hiatal hernia  . hemorrhoid surgery    . KNEE SURGERY     left  . TOTAL KNEE ARTHROPLASTY Left 02/15/2015   Procedure: TOTAL KNEE ARTHROPLASTY;  Surgeon: Vickey Huger, MD;  Location: [facility];  Service: Orthopedics;  Laterality: Left;  . TUBAL LIGATION    . WRIST SURGERY     right    Family History  Problem Relation Age of Onset  . Heart disease Mother   .  Cancer Father   . Diabetes Father   . Heart disease Father   . Stroke Father   . Seizures Son     Social History   Socioeconomic History  . Marital status: Widowed    Spouse name: Not on file  . Number of children: 4  . Years of education: Not on file  . Highest education level: Not on file  Occupational History  . Occupation: Retired  Tobacco Use  . Smoking status: Current Every Day Smoker    Packs/day: 1.00    Years: 47.00    Pack years: 47.00    Types: Cigarettes  . Smokeless tobacco: Never Used  Substance and Sexual Activity  . Alcohol use: No  . Drug use: No  . Sexual activity: Not Currently  Other Topics Concern  . Not on  file  Social History Narrative  . Not on file   Social Determinants of Health   Financial Resource Strain:   . Difficulty of Paying Living Expenses:   Food Insecurity:   . Worried About Charity fundraiser in the Last Year:   . Arboriculturist in the Last Year:   Transportation Needs:   . Film/video editor (Medical):   Marland Kitchen Lack of Transportation (Non-Medical):   Physical Activity:   . Days of Exercise per Week:   . Minutes of Exercise per Session:   Stress:   . Feeling of Stress :   Social Connections:   . Frequency of Communication with Friends and Family:   . Frequency of Social Gatherings with Friends and Family:   . Attends Religious Services:   . Active Member of Clubs or Organizations:   . Attends Archivist Meetings:   Marland Kitchen Marital Status:   Intimate Partner Violence:   . Fear of Current or Ex-Partner:   . Emotionally Abused:   Marland Kitchen Physically Abused:   . Sexually Abused:     Outpatient Medications Prior to Visit  Medication Sig Dispense Refill  . albuterol (VENTOLIN HFA) 108 (90 Base) MCG/ACT inhaler Inhale 2 puffs into the lungs every 6 (six) hours as needed for wheezing or shortness of breath. 8 g 6  . atorvastatin (LIPITOR) 40 MG tablet     . Budeson-Glycopyrrol-Formoterol 160-9-4.8 MCG/ACT AERO Inhale 2 puffs into the lungs in the morning and at bedtime. 10.7 g 6  . buPROPion (WELLBUTRIN XL) 150 MG 24 hr tablet Take 150 mg by mouth daily.    Marland Kitchen ibuprofen (ADVIL) 600 MG tablet ibuprofen 600 mg tablet  TK 1 T PO TID    . lisinopril-hydrochlorothiazide (PRINZIDE,ZESTORETIC) 20-12.5 MG per tablet Take 2 tablets by mouth daily.    Marland Kitchen omeprazole (PRILOSEC) 40 MG capsule Take 40 mg by mouth daily as needed.     Marland Kitchen oxyCODONE-acetaminophen (PERCOCET) 10-325 MG tablet Take 1 tablet by mouth every 6 (six) hours as needed.    . traZODone (DESYREL) 100 MG tablet Take 100 mg by mouth at bedtime.    . diclofenac (VOLTAREN) 75 MG EC tablet Take 75 mg by mouth 2 (two)  times daily.    . methocarbamol (ROBAXIN) 500 MG tablet Take 1-2 tablets (500-1,000 mg total) by mouth every 6 (six) hours as needed for muscle spasms. 60 tablet 2  . naloxone (NARCAN) 4 MG/0.1ML LIQD nasal spray kit Narcan 4 mg/actuation nasal spray  Take by nasal route every 3 minutes until patient awakes or EMS arrives.     No facility-administered medications prior to visit.  Allergies  Allergen Reactions  . Meperidine Anaphylaxis    Blood pressure dropped  . Influenza Vaccines     Flu like symptoms    ROS Review of Systems  Constitutional: Positive for fatigue.  HENT: Negative.   Eyes: Negative.   Respiratory: Positive for wheezing.   Cardiovascular: Negative.   Gastrointestinal: Negative.   Endocrine: Negative.   Genitourinary: Negative.   Musculoskeletal: Negative.   Neurological: Negative.   Psychiatric/Behavioral: Negative.       Objective:    Physical Exam  Constitutional: She is oriented to person, place, and time. She appears well-developed and well-nourished.  HENT:  Head: Normocephalic and atraumatic.  Eyes: Pupils are equal, round, and reactive to light. Conjunctivae and EOM are normal.  Cardiovascular: Normal rate, regular rhythm and normal heart sounds.  Pulmonary/Chest: Effort normal and breath sounds normal.  Abdominal: Soft. Bowel sounds are normal.  Musculoskeletal:        General: Normal range of motion.     Cervical back: Normal range of motion and neck supple.  Neurological: She is alert and oriented to person, place, and time.  Skin: Skin is warm and dry.  Psychiatric: She has a normal mood and affect.   Diabetic Foot Exam - Simple   Simple Foot Form Diabetic Foot exam was performed with the following findings: Yes 08/11/2019 10:27 AM  Visual Inspection See comments: Yes Sensation Testing See comments: Yes Pulse Check Posterior Tibialis and Dorsalis pulse intact bilaterally: Yes Comments Patient hs less sensation with monofilament  both feet, she has hammer toes and bunion      BP (!) 196/88 (BP Location: Right Arm, Patient Position: Sitting)   Pulse 67   Temp (!) 97.3 F (36.3 C) (Temporal)   Resp 17   Ht _0  (1.575 m)   Wt 168 lb 6.4 oz (76.4 kg)   SpO2 97%   BMI 30.80 kg/m  Wt Readings from Last 3 Encounters:  08/11/19 168 lb 6.4 oz (76.4 kg)  08/07/19 173 lb 3.2 oz (78.6 kg)  02/15/15 163 lb 7 oz (74.1 kg)     Health Maintenance Due  Topic Date Due  . Hepatitis C Screening  Never done  . OPHTHALMOLOGY EXAM  Never done  . TETANUS/TDAP  Never done  . MAMMOGRAM  Never done  . COLONOSCOPY  Never done  . DEXA SCAN  Never done  . PNA vac Low Risk Adult (1 of 2 - PCV13) Never done  . HEMOGLOBIN A1C  08/15/2015  . INFLUENZA VACCINE  Never done    There are no preventive care reminders to display for this patient.  No results found for: TSH Lab Results  Component Value Date   WBC 8.9 02/17/2015   HGB 11.0 (L) 02/17/2015   HCT 33.6 (L) 02/17/2015   MCV 94.9 02/17/2015   PLT 150 02/17/2015   Lab Results  Component Value Date   NA 131 (L) 02/16/2015   K 3.8 02/16/2015   CO2 26 02/16/2015   GLUCOSE 118 (H) 02/16/2015   BUN 7 02/16/2015   CREATININE 0.55 02/16/2015   BILITOT 0.4 01/28/2015   ALKPHOS 73 01/28/2015   AST 21 01/28/2015   ALT 14 01/28/2015   PROT 6.9 01/28/2015   ALBUMIN 4.1 01/28/2015   CALCIUM 8.3 (L) 02/16/2015   ANIONGAP 6 02/16/2015   No results found for: CHOL No results found for: HDL No results found for: LDLCALC No results found for: TRIG No results found for: [facility] Lab Results  Component  Value Date   HGBA1C 5.6 02/15/2015      Assessment & Plan:   Problem List Items Addressed This Visit      Cardiovascular and Mediastinum   Essential hypertension - Primary    An individual care plan was established and reinforced today.  The patient's status was assessed using clinical findings on exam and labs or diagnostic tests. The patient's success at meeting  treatment goals on disease specific evidence-based guidelines and found to be well controlled. SELF MANAGEMENT: The patient and I together assessed ways to personally work towards obtaining the recommended goals. RECOMMENDATIONS: avoid decongestants found in common cold remedies, decrease consumption of alcohol, perform routine monitoring of BP with home BP cuff, exercise, reduction of dietary salt, take medicines as prescribed, try not to miss doses and quit smoking.  Regular exercise and maintaining a healthy weight is needed.  Stress reduction may help. A CLINICAL SUMMARY including written plan identify barriers to care unique to individual due to social or financial issues.  We attempt to mutually creat solutions for individual and family understanding.      Relevant Orders   CBC with Differential   Comprehensive metabolic panel     Respiratory   COPD (chronic obstructive pulmonary disease) ([place])    An individualize plan was formulated for care of COPD.  Treatment is evidence based.  She will continue on inhalers, avoid smoking and smoke.  Regular exercise with help with dyspnea. Routine follow ups and medication compliance is needed.      Relevant Orders   Ambulatory referral to Pulmonology     Endocrine   Type 2 diabetes mellitus with other specified complication [facility])    An individual care plan was established and reinforced today.  The patient's status was assessed using clinical findings on exam, labs and diagnostic testing. Patient success at meeting goals based on disease specific evidence-based guidelines and found to be good controlled. Medications were assessed and patient's understanding of the medical issues , including barriers were assessed. Recommend adherence to a diabetic diet, a graduated exercise program, HgbA1c level is checked quarterly, and urine microalbumin performed yearly .  Annual mono-filament sensation testing performed. Lower blood pressure and control  hyperlipidemia is important. Get annual eye exams and annual flu shots and smoking cessation discussed.  Self management goals were discussed.      Relevant Orders   Hemoglobin A1c     Other   Mixed hyperlipidemia    AN INDIVIDUAL CARE PLAN was established and reinforced today.  The patient's status was assessed using clinical findings on exam, lab and other diagnostic tests. The patient's disease status was assessed based on evidence-based guidelines and found to be well controlled. MEDICATIONS were reviewed. SELF MANAGEMENT GOALS have been discussed and patient's success at attaining the goal of low cholesterol was assessed. RECOMMENDATION given include regular exercise 3 days a week and low cholesterol/low fat diet. CLINICAL SUMMARY including written plan to identify barriers unique to the patient due to social or economic  reasons was discussed.      Relevant Orders   Lipid Panel    Other Visit Diagnoses    Depression, major, single episode, mild (HCC)       Relevant Medications   sertraline (ZOLOFT) 50 MG tablet      Meds ordered this encounter  Medications  . triamcinolone acetonide (KENALOG-40) injection 80 mg  . sertraline (ZOLOFT) 50 MG tablet    Sig: Take 1 tablet (50 mg total) by mouth daily.  Dispense:  30 tablet    Refill:  3    Follow-up: Return in about 1 month (around 09/11/2019).    [name], MD

## 2019-08-11 NOTE — Patient Instructions (Signed)
Taper buprion to one a day for one week and then stop, then start new medicine- zoloft

## 2019-08-11 NOTE — Assessment & Plan Note (Signed)
An individualize plan was formulated for care of COPD.  Treatment is evidence based.  She will continue on inhalers, avoid smoking and smoke.  Regular exercise with help with dyspnea. Routine follow ups and medication compliance is needed. 

## 2019-08-11 NOTE — Addendum Note (Signed)
Addended by: Thompson Caul I on: 08/11/2019 04:30 PM   Modules accepted: Orders

## 2019-08-11 NOTE — Assessment & Plan Note (Signed)

## 2019-08-12 LAB — CBC WITH DIFFERENTIAL/PLATELET
Basophils Absolute: 0 10*3/uL (ref 0.0–0.2)
Basos: 1 %
EOS (ABSOLUTE): 0 10*3/uL (ref 0.0–0.4)
Eos: 0 %
Hematocrit: 40.9 % (ref 34.0–46.6)
Hemoglobin: 14.1 g/dL (ref 11.1–15.9)
Immature Grans (Abs): 0 10*3/uL (ref 0.0–0.1)
Immature Granulocytes: 0 %
Lymphocytes Absolute: 1.9 10*3/uL (ref 0.7–3.1)
Lymphs: 24 %
MCH: 32.2 pg (ref 26.6–33.0)
MCHC: 34.5 g/dL (ref 31.5–35.7)
MCV: 93 fL (ref 79–97)
Monocytes Absolute: 0.8 10*3/uL (ref 0.1–0.9)
Monocytes: 10 %
Neutrophils Absolute: 5.1 10*3/uL (ref 1.4–7.0)
Neutrophils: 65 %
Platelets: 179 10*3/uL (ref 150–450)
RBC: 4.38 x10E6/uL (ref 3.77–5.28)
RDW: 12.8 % (ref 11.7–15.4)
WBC: 7.9 10*3/uL (ref 3.4–10.8)

## 2019-08-12 LAB — COMPREHENSIVE METABOLIC PANEL
ALT: 9 IU/L (ref 0–32)
AST: 17 IU/L (ref 0–40)
Albumin/Globulin Ratio: 1.8 (ref 1.2–2.2)
Albumin: 4.1 g/dL (ref 3.7–4.7)
Alkaline Phosphatase: 88 IU/L (ref 39–117)
BUN/Creatinine Ratio: 18 (ref 12–28)
BUN: 15 mg/dL (ref 8–27)
Bilirubin Total: 0.3 mg/dL (ref 0.0–1.2)
CO2: 25 mmol/L (ref 20–29)
Calcium: 9.9 mg/dL (ref 8.7–10.3)
Chloride: 101 mmol/L (ref 96–106)
Creatinine, Ser: 0.85 mg/dL (ref 0.57–1.00)
GFR calc Af Amer: 79 mL/min/{1.73_m2} (ref >59)
GFR calc non Af Amer: 68 mL/min/{1.73_m2} (ref >59)
Globulin, Total: 2.3 g/dL (ref 1.5–4.5)
Glucose: 90 mg/dL (ref 65–99)
Potassium: 4.1 mmol/L (ref 3.5–5.2)
Sodium: 140 mmol/L (ref 134–144)
Total Protein: 6.4 g/dL (ref 6.0–8.5)

## 2019-08-12 LAB — HEMOGLOBIN A1C
Est. average glucose Bld gHb Est-mCnc: 100 mg/dL
Hgb A1c MFr Bld: 5.1 % (ref 4.8–5.6)

## 2019-08-12 LAB — LIPID PANEL
Chol/HDL Ratio: 2.5 ratio (ref 0.0–4.4)
Cholesterol, Total: 160 mg/dL (ref 100–199)
HDL: 65 mg/dL (ref >39)
LDL Chol Calc (NIH): 81 mg/dL (ref 0–99)
Triglycerides: 74 mg/dL (ref 0–149)
VLDL Cholesterol Cal: 14 mg/dL (ref 5–40)

## 2019-08-12 LAB — CARDIOVASCULAR RISK ASSESSMENT

## 2019-08-12 NOTE — Progress Notes (Signed)
CBC now normal, kidney and liver tests normal, cholesterol normal, A1c 5.1 lp

## 2019-08-19 ENCOUNTER — Encounter: Payer: Self-pay | Admitting: Cardiology

## 2019-08-19 ENCOUNTER — Other Ambulatory Visit: Payer: Self-pay

## 2019-08-19 ENCOUNTER — Ambulatory Visit (INDEPENDENT_AMBULATORY_CARE_PROVIDER_SITE_OTHER): Payer: Medicare Other | Admitting: Cardiology

## 2019-08-19 VITALS — BP 166/82 | HR 69 | Ht 62.0 in | Wt 167.0 lb

## 2019-08-19 DIAGNOSIS — I10 Essential (primary) hypertension: Secondary | ICD-10-CM

## 2019-08-19 DIAGNOSIS — E782 Mixed hyperlipidemia: Secondary | ICD-10-CM | POA: Diagnosis not present

## 2019-08-19 DIAGNOSIS — R079 Chest pain, unspecified: Secondary | ICD-10-CM | POA: Insufficient documentation

## 2019-08-19 DIAGNOSIS — R6889 Other general symptoms and signs: Secondary | ICD-10-CM | POA: Diagnosis not present

## 2019-08-19 DIAGNOSIS — Z720 Tobacco use: Secondary | ICD-10-CM

## 2019-08-19 DIAGNOSIS — R0602 Shortness of breath: Secondary | ICD-10-CM | POA: Insufficient documentation

## 2019-08-19 DIAGNOSIS — E669 Obesity, unspecified: Secondary | ICD-10-CM

## 2019-08-19 DIAGNOSIS — E109 Type 1 diabetes mellitus without complications: Secondary | ICD-10-CM

## 2019-08-19 HISTORY — DX: Tobacco use: Z72.0

## 2019-08-19 MED ORDER — AMLODIPINE BESYLATE 5 MG PO TABS: 5.0000 mg | ORAL_TABLET | Freq: Every day | ORAL | 3 refills | Status: DC

## 2019-08-19 MED ORDER — NITROGLYCERIN 0.4 MG SL SUBL: 0.4000 mg | SUBLINGUAL_TABLET | SUBLINGUAL | 3 refills | Status: DC | PRN

## 2019-08-19 NOTE — Progress Notes (Signed)
Cardiology Office Note:    Date:  08/19/2019   ID:  [name] [name], [name] [name] [birth year], MRN [id number]  PCP:  [name] Anes, MD  Cardiologist:  No primary care provider on file.  Electrophysiologist:  None   Referring MD: [name] [name],*   Chief Complaint  Patient presents with  . Chest Pain    History of Present Illness:    [name] [name] is a 74 y.o. female with a hx of diabetes mellitus reports that she is not on any medication for that she is diet-controlled, hypertension, hyperlipidemia family history for coronary artery disease, current smoker presents today to be evaluated for chest pain.  Patient tells me that over the last several months she has had worsening intermittent chest pain.  She describes this as a mid substernal dull sensation which starts usually at the mid edge of her sternum and radiates up to her left chest and up her shoulder and down her arms.  She notes that in the beginning this pain will just come for a few minutes and resolve itself however recently has been occurring in episodes that would last for about an hour.  The most recent symptoms that is now bothersome is the fact that she is short of breath on exertion but this is well.  Nothing makes this better or worse.  She has not had such pain in the past.  She tells me that the pain is about an 8 out of 10 at its worse.  He has not had any medication to help this pain.  No other complaints at this time.  Past Medical History:  Diagnosis Date  . Arthritis   . Asthma   . Bowel trouble   . Chronic left shoulder pain 09/27/2015  . Chronic pain in female pelvis   . Colon disorder   . COPD (chronic obstructive pulmonary disease) ([place])   . Depression   . Diabetes mellitus without complication ([place])   . Dyspnea on exertion   . Enlarged aorta ([place])   . Esophageal dysphagia   . Eye problem   . Fibromyalgia   . Frequent headaches   . GERD (gastroesophageal reflux disease)   . Heart disease   .  History of colon polyps   . Hyperlipidemia   . Hypertension   . IBS (irritable bowel syndrome)   . Insomnia   . Joint pain   . Lupus ([place]) 12/2017  . Myocardial infarction ([place])    mild-heart attack  pt.living in [place] at the time  . Osteoarthritis   . Pain of right hip joint 11/22/2015  . Primary osteoarthritis of left knee 02/04/2015  . Rash   . S/P total knee arthroplasty 02/15/2015  . Sleep trouble   . SOB (shortness of breath)   . Weakness     Past Surgical History:  Procedure Laterality Date  . ABDOMINAL HYSTERECTOMY    . BREAST BIOPSY Right   . CHOLECYSTECTOMY    . COLONOSCOPY  09/04/2011   Colonic polyps, status post polyectomy. Incidental small ascending colon lipoma  . ESOPHAGOGASTRODUODENOSCOPY  09/08/2015   Schatzki ring status post esophageal dilitation. Small hiatal hernia  . hemorrhoid surgery    . KNEE SURGERY     left  . TOTAL KNEE ARTHROPLASTY Left 02/15/2015   Procedure: TOTAL KNEE ARTHROPLASTY;  Surgeon: [name] Huger, MD;  Location: [facility];  Service: Orthopedics;  Laterality: Left;  . TUBAL LIGATION    . WRIST SURGERY     right  Current Medications: Current Meds  Medication Sig  . albuterol (VENTOLIN HFA) 108 (90 Base) MCG/ACT inhaler Inhale 2 puffs into the lungs every 6 (six) hours as needed for wheezing or shortness of breath.  Marland Kitchen atorvastatin (LIPITOR) 40 MG tablet   . Budeson-Glycopyrrol-Formoterol 160-9-4.8 MCG/ACT AERO Inhale 2 puffs into the lungs in the morning and at bedtime.  Marland Kitchen buPROPion (WELLBUTRIN XL) 150 MG 24 hr tablet Take 150 mg by mouth daily.  Marland Kitchen ibuprofen (ADVIL) 600 MG tablet ibuprofen 600 mg tablet  TK 1 T PO TID  . lisinopril-hydrochlorothiazide (PRINZIDE,ZESTORETIC) 20-12.5 MG per tablet Take 2 tablets by mouth daily.  Marland Kitchen omeprazole (PRILOSEC) 40 MG capsule Take 40 mg by mouth daily as needed.   Marland Kitchen oxyCODONE-acetaminophen (PERCOCET) 10-325 MG tablet Take 1 tablet by mouth every 6 (six) hours as needed.  . traZODone (DESYREL) 100  MG tablet Take 100 mg by mouth at bedtime.     Allergies:   Meperidine and Influenza vaccines   Social History   Socioeconomic History  . Marital status: Widowed    Spouse name: Not on file  . Number of children: 4  . Years of education: Not on file  . Highest education level: Not on file  Occupational History  . Occupation: Retired  Tobacco Use  . Smoking status: Current Every Day Smoker    Packs/day: 1.00    Years: 47.00    Pack years: 47.00    Types: Cigarettes  . Smokeless tobacco: Never Used  Substance and Sexual Activity  . Alcohol use: No  . Drug use: No  . Sexual activity: Not Currently  Other Topics Concern  . Not on file  Social History Narrative  . Not on file   Social Determinants of Health   Financial Resource Strain:   . Difficulty of Paying Living Expenses:   Food Insecurity:   . Worried About Charity fundraiser in the Last Year:   . Arboriculturist in the Last Year:   Transportation Needs:   . Film/video editor (Medical):   Marland Kitchen Lack of Transportation (Non-Medical):   Physical Activity:   . Days of Exercise per Week:   . Minutes of Exercise per Session:   Stress:   . Feeling of Stress :   Social Connections:   . Frequency of Communication with Friends and Family:   . Frequency of Social Gatherings with Friends and Family:   . Attends Religious Services:   . Active Member of Clubs or Organizations:   . Attends Archivist Meetings:   Marland Kitchen Marital Status:      Family History: The patient's family history includes Cancer in her father; Diabetes in her father; Heart disease in her father and mother; Seizures in her son; Stroke in her father.  ROS:   Review of Systems  Constitution: Negative for decreased appetite, fever and weight gain.  HENT: Negative for congestion, ear discharge, hoarse voice and sore throat.   Eyes: Negative for discharge, redness, vision loss in right eye and visual halos.  Cardiovascular: Reports chest pain,  dyspnea on exertion. Negative for  leg swelling, orthopnea and palpitations.  Respiratory: Negative for cough, hemoptysis, shortness of breath and snoring.   Endocrine: Negative for heat intolerance and polyphagia.  Hematologic/Lymphatic: Negative for bleeding problem. Does not bruise/bleed easily.  Skin: Negative for flushing, nail changes, rash and suspicious lesions.  Musculoskeletal: Negative for arthritis, joint pain, muscle cramps, myalgias, neck pain and stiffness.  Gastrointestinal: Negative for abdominal pain,  bowel incontinence, diarrhea and excessive appetite.  Genitourinary: Negative for decreased libido, genital sores and incomplete emptying.  Neurological: Negative for brief paralysis, focal weakness, headaches and loss of balance.  Psychiatric/Behavioral: Negative for altered mental status, depression and suicidal ideas.  Allergic/Immunologic: Negative for HIV exposure and persistent infections.    EKGs/Labs/Other Studies Reviewed:    The following studies were reviewed today:   EKG:  The ekg ordered today demonstrates sinus rhythm, heart rate 67 bpm, PAC to EKG done on 08/07/2019 no significant change.   Recent Labs: 08/11/2019: ALT 9; BUN 15; Creatinine, Ser 0.85; Hemoglobin 14.1; Platelets 179; Potassium 4.1; Sodium 140  Recent Lipid Panel    Component Value Date/Time   CHOL 160 08/11/2019 1044   TRIG 74 08/11/2019 1044   HDL 65 08/11/2019 1044   CHOLHDL 2.5 08/11/2019 1044   LDLCALC 81 08/11/2019 1044    Physical Exam:    VS:  BP (!) 166/82 (BP Location: Right Arm, Patient Position: Sitting, Cuff Size: Large)   Pulse 69   Ht 5\' 2"  (1.575 m)   Wt 167 lb (75.8 kg)   SpO2 99%   BMI 30.54 kg/m     Wt Readings from Last 3 Encounters:  08/19/19 167 lb (75.8 kg)  08/11/19 168 lb 6.4 oz (76.4 kg)  08/07/19 173 lb 3.2 oz (78.6 kg)     GEN: Well nourished, well developed in no acute distress HEENT: Normal NECK: No JVD; No carotid bruits LYMPHATICS: No  lymphadenopathy CARDIAC: S1S2 noted,RRR, no murmurs, rubs, gallops RESPIRATORY:  Clear to auscultation without rales, wheezing or rhonchi  ABDOMEN: Soft, non-tender, non-distended, +bowel sounds, no guarding. EXTREMITIES: No edema, No cyanosis, no clubbing MUSCULOSKELETAL:  No deformity  SKIN: Warm and dry NEUROLOGIC:  Alert and oriented x 3, non-focal PSYCHIATRIC:  Normal affect, good insight  ASSESSMENT:    1. Chest pain, unspecified type   2. SOB (shortness of breath)   3. Essential hypertension   4. Mixed hyperlipidemia   5. Obesity (BMI 30-39.9)   6. Type 1 diabetes mellitus without complication (HCC)   7. Tobacco use    PLAN:    1.  Chest pain-the patient does have intermediate to high risk factors for coronary artery disease (smoker, hypertension, hyperlipidemia, diabetes and family history) therefore at this time I like to proceed with an ischemic evaluation in this patient.  I have discussed the different testing shared decision to patient would prefer to undergo a pharmacologic nuclear stress test.  She will be unable to walk on a treadmill given her leg pain.  In addition, sublingual nitroglycerin prescription was sent, its protocol and 911 protocol explained and the patient vocalized understanding questions were answered to the patient's satisfaction.  2.  For shortness of breath-this could be anginal equivalent as stated above but for full complete work-up a transthoracic echocardiogram will also be performed to assess RV/LV function and any other structural abnormality.  Assessing RV function is very important in this patient to look at right heart pressures given history of COPD.  3.  She is hypertensive in the office today.  She is currently on lisinopril 20 mg daily along with hydrochlorothiazide 12.5 mg daily.  I am going to add amlodipine 5 mg daily to her regimen.  The patient denies any allergies to this medication has never taken it before.  She was educated on the  side effects and all questions were answered.  4.  Hyperlipidemia-continue atorvastatin 40 mg daily she tells me that her PCP  recently did lipid profile I which I was able to review in epic, she will continue her current medication regimen.  5.  Diabetes mellitus-recent hemoglobin A1c 5.1 she is being managed by her PCP for this.  6.  Tobacco use-the patient was counseled on tobacco cessation today for 5 minutes.  Counseling included reviewing the risks of smoking tobacco products, how it impacts the patient's current medical diagnoses and different strategies for quitting.  Pharmacotherapy to aid in tobacco cessation was not prescribed today. The patient coordinate with  primary care provider.  The patient was also advised to call  1-800-QUIT-NOW 959-842-0789) for additional help with quitting smoking.  7.  Obesity-the patient understands the need to lose weight with diet and exercise. We have discussed specific strategies for this.  The patient is in agreement with the above plan. The patient left the office in stable condition.  The patient will follow up in 3 months or sooner if needed.   Medication Adjustments/Labs and Tests Ordered: Current medicines are reviewed at length with the patient today.  Concerns regarding medicines are outlined above.  Orders Placed This Encounter  Procedures  . MYOCARDIAL PERFUSION IMAGING  . EKG 12-Lead  . ECHOCARDIOGRAM COMPLETE   Meds ordered this encounter  Medications  . nitroGLYCERIN (NITROSTAT) 0.4 MG SL tablet    Sig: Place 1 tablet (0.4 mg total) under the tongue every 5 (five) minutes as needed for chest pain.    Dispense:  25 tablet    Refill:  3  . amLODipine (NORVASC) 5 MG tablet    Sig: Take 1 tablet (5 mg total) by mouth daily.    Dispense:  90 tablet    Refill:  3    Patient Instructions  Medication Instructions:  1) Start Amlodipine (Norvasc) 5 mg daily   2) Start Nitroglycerin 0.4 mg every 5 minutes as needed for chest pain     *If you need a refill on your cardiac medications before your next appointment, please call your pharmacy*   Lab Work: None ordered   If you have labs (blood work) drawn today and your tests are completely normal, you will receive your results only by: Marland Kitchen MyChart Message (if you have MyChart) OR . A paper copy in the mail If you have any lab test that is abnormal or we need to change your treatment, we will call you to review the results.   Testing/Procedures: Your physician has requested that you have an echocardiogram. Echocardiography is a painless test that uses sound waves to create images of your heart. It provides your doctor with information about the size and shape of your heart and how well your heart's chambers and valves are working. This procedure takes approximately one hour. There are no restrictions for this procedure.  Your physician has requested that you have a lexiscan myoview. For further information please visit [email or web address]. Please follow instruction sheet, as given.    Follow-Up: At [facility], you and your health needs are our priority.  As part of our continuing mission to provide you with exceptional heart care, we have created designated Provider Care Teams.  These Care Teams include your primary Cardiologist (physician) and Advanced Practice Providers (APPs -  Physician Assistants and Nurse Practitioners) who all work together to provide you with the care you need, when you need it.  We recommend signing up for the patient portal called "MyChart".  Sign up information is provided on this After Visit Summary.  MyChart is used to connect  with patients for Virtual Visits (Telemedicine).  Patients are able to view lab/test results, encounter notes, upcoming appointments, etc.  Non-urgent messages can be sent to your provider as well.   To learn more about what you can do with MyChart, go to [email or web address].    Your next appointment:   3  month(s)  The format for your next appointment:   In Person  Provider:   Berniece Salines, DO   Other Instructions Nitroglycerin sublingual tablets What is this medicine? NITROGLYCERIN (nye troe GLI ser in) is a type of vasodilator. It relaxes blood vessels, increasing the blood and oxygen supply to your heart. This medicine is used to relieve chest pain caused by angina. It is also used to prevent chest pain before activities like climbing stairs, going outdoors in cold weather, or sexual activity. This medicine may be used for other purposes; ask your health care provider or pharmacist if you have questions. COMMON BRAND NAME(S): Nitroquick, Nitrostat, Nitrotab What should I tell my health care provider before I take this medicine? They need to know if you have any of these conditions:  anemia  head injury, recent stroke, or bleeding in the brain  liver disease  previous heart attack  an unusual or allergic reaction to nitroglycerin, other medicines, foods, dyes, or preservatives  pregnant or trying to get pregnant  breast-feeding How should I use this medicine? Take this medicine by mouth as needed. At the first sign of an angina attack (chest pain or tightness) place one tablet under your tongue. You can also take this medicine 5 to 10 minutes before an event likely to produce chest pain. Follow the directions on the prescription label. Let the tablet dissolve under the tongue. Do not swallow whole. Replace the dose if you accidentally swallow it. It will help if your mouth is not dry. Saliva around the tablet will help it to dissolve more quickly. Do not eat or drink, smoke or chew tobacco while a tablet is dissolving. If you are not better within 5 minutes after taking ONE dose of nitroglycerin, call 9-1-1 immediately to seek emergency medical care. Do not take more than 3 nitroglycerin tablets over 15 minutes. If you take this medicine often to relieve symptoms of angina, your  doctor or health care professional may provide you with different instructions to manage your symptoms. If symptoms do not go away after following these instructions, it is important to call 9-1-1 immediately. Do not take more than 3 nitroglycerin tablets over 15 minutes. Talk to your pediatrician regarding the use of this medicine in children. Special care may be needed. Overdosage: If you think you have taken too much of this medicine contact a poison control center or emergency room at once. NOTE: This medicine is only for you. Do not share this medicine with others. What if I miss a dose? This does not apply. This medicine is only used as needed. What may interact with this medicine? Do not take this medicine with any of the following medications:  certain migraine medicines like ergotamine and dihydroergotamine (DHE)  medicines used to treat erectile dysfunction like sildenafil, tadalafil, and vardenafil  riociguat This medicine may also interact with the following medications:  alteplase  aspirin  heparin  medicines for high blood pressure  medicines for mental depression  other medicines used to treat angina  phenothiazines like chlorpromazine, mesoridazine, prochlorperazine, thioridazine This list may not describe all possible interactions. Give your health care provider a list of all the medicines, herbs,  non-prescription drugs, or dietary supplements you use. Also tell them if you smoke, drink alcohol, or use illegal drugs. Some items may interact with your medicine. What should I watch for while using this medicine? Tell your doctor or health care professional if you feel your medicine is no longer working. Keep this medicine with you at all times. Sit or lie down when you take your medicine to prevent falling if you feel dizzy or faint after using it. Try to remain calm. This will help you to feel better faster. If you feel dizzy, take several deep breaths and lie down  with your feet propped up, or bend forward with your head resting between your knees. You may get drowsy or dizzy. Do not drive, use machinery, or do anything that needs mental alertness until you know how this drug affects you. Do not stand or sit up quickly, especially if you are an older patient. This reduces the risk of dizzy or fainting spells. Alcohol can make you more drowsy and dizzy. Avoid alcoholic drinks. Do not treat yourself for coughs, colds, or pain while you are taking this medicine without asking your doctor or health care professional for advice. Some ingredients may increase your blood pressure. What side effects may I notice from receiving this medicine? Side effects that you should report to your doctor or health care professional as soon as possible:  blurred vision  dry mouth  skin rash  sweating  the feeling of extreme pressure in the head  unusually weak or tired Side effects that usually do not require medical attention (report to your doctor or health care professional if they continue or are bothersome):  flushing of the face or neck  headache  irregular heartbeat, palpitations  nausea, vomiting This list may not describe all possible side effects. Call your doctor for medical advice about side effects. You may report side effects to FDA at 1-800-FDA-1088. Where should I keep my medicine? Keep out of the reach of children. Store at room temperature between 20 and 25 degrees C (68 and 77 degrees F). Store in Chief of Staff. Protect from light and moisture. Keep tightly closed. Throw away any unused medicine after the expiration date. NOTE: This sheet is a summary. It may not cover all possible information. If you have questions about this medicine, talk to your doctor, pharmacist, or health care provider.  2020 Elsevier/Gold Standard (2013-03-13 17:57:36)     Adopting a Healthy Lifestyle.  Know what a healthy weight is for you (roughly BMI <25) and  aim to maintain this   Aim for 7+ servings of fruits and vegetables daily   65-80+ fluid ounces of water or unsweet tea for healthy kidneys   Limit to max 1 drink of alcohol per day; avoid smoking/tobacco   Limit animal fats in diet for cholesterol and heart health - choose grass fed whenever available   Avoid highly processed foods, and foods high in saturated/trans fats   Aim for low stress - take time to unwind and care for your mental health   Aim for 150 min of moderate intensity exercise weekly for heart health, and weights twice weekly for bone health   Aim for 7-9 hours of sleep daily   When it comes to diets, agreement about the perfect plan isnt easy to find, even among the experts. Experts at the [employer] developed an idea known as the Healthy Eating Plate. Just imagine a plate divided into logical, healthy portions.  The emphasis is on diet quality:   Load up on vegetables and fruits - one-half of your plate: Aim for color and variety, and remember that potatoes dont count.   Go for whole grains - one-quarter of your plate: Whole wheat, barley, wheat berries, quinoa, oats, brown rice, and foods made with them. If you want pasta, go with whole wheat pasta.   Protein power - one-quarter of your plate: Fish, chicken, beans, and nuts are all healthy, versatile protein sources. Limit red meat.   The diet, however, does go beyond the plate, offering a few other suggestions.   Use healthy plant oils, such as olive, canola, soy, corn, sunflower and peanut. Check the labels, and avoid partially hydrogenated oil, which have unhealthy trans fats.   If youre thirsty, drink water. Coffee and tea are good in moderation, but skip sugary drinks and limit milk and dairy products to one or two daily servings.   The type of carbohydrate in the diet is more important than the amount. Some sources of carbohydrates, such as vegetables, fruits, whole grains, and  beans-are healthier than others.   Finally, stay active  Signed, Berniece Salines, DO  08/19/2019 2:06 PM    [place]

## 2019-08-19 NOTE — Patient Instructions (Signed)
Medication Instructions:  1) Start Amlodipine (Norvasc) 5 mg daily   2) Start Nitroglycerin 0.4 mg every 5 minutes as needed for chest pain   *If you need a refill on your cardiac medications before your next appointment, please call your pharmacy*   Lab Work: None ordered   If you have labs (blood work) drawn today and your tests are completely normal, you will receive your results only by: Marland Kitchen MyChart Message (if you have MyChart) OR . A paper copy in the mail If you have any lab test that is abnormal or we need to change your treatment, we will call you to review the results.   Testing/Procedures: Your physician has requested that you have an echocardiogram. Echocardiography is a painless test that uses sound waves to create images of your heart. It provides your doctor with information about the size and shape of your heart and how well your heart's chambers and valves are working. This procedure takes approximately one hour. There are no restrictions for this procedure.  Your physician has requested that you have a lexiscan myoview. For further information please visit [email or web address]. Please follow instruction sheet, as given.    Follow-Up: At [facility], you and your health needs are our priority.  As part of our continuing mission to provide you with exceptional heart care, we have created designated Provider Care Teams.  These Care Teams include your primary Cardiologist (physician) and Advanced Practice Providers (APPs -  Physician Assistants and Nurse Practitioners) who all work together to provide you with the care you need, when you need it.  We recommend signing up for the patient portal called "MyChart".  Sign up information is provided on this After Visit Summary.  MyChart is used to connect with patients for Virtual Visits (Telemedicine).  Patients are able to view lab/test results, encounter notes, upcoming appointments, etc.  Non-urgent messages can be sent to your  provider as well.   To learn more about what you can do with MyChart, go to [email or web address].    Your next appointment:   3 month(s)  The format for your next appointment:   In Person  Provider:   Berniece Salines, DO   Other Instructions Nitroglycerin sublingual tablets What is this medicine? NITROGLYCERIN (nye troe GLI ser in) is a type of vasodilator. It relaxes blood vessels, increasing the blood and oxygen supply to your heart. This medicine is used to relieve chest pain caused by angina. It is also used to prevent chest pain before activities like climbing stairs, going outdoors in cold weather, or sexual activity. This medicine may be used for other purposes; ask your health care provider or pharmacist if you have questions. COMMON BRAND NAME(S): Nitroquick, Nitrostat, Nitrotab What should I tell my health care provider before I take this medicine? They need to know if you have any of these conditions:  anemia  head injury, recent stroke, or bleeding in the brain  liver disease  previous heart attack  an unusual or allergic reaction to nitroglycerin, other medicines, foods, dyes, or preservatives  pregnant or trying to get pregnant  breast-feeding How should I use this medicine? Take this medicine by mouth as needed. At the first sign of an angina attack (chest pain or tightness) place one tablet under your tongue. You can also take this medicine 5 to 10 minutes before an event likely to produce chest pain. Follow the directions on the prescription label. Let the tablet dissolve under the tongue. Do not swallow whole. Replace  the dose if you accidentally swallow it. It will help if your mouth is not dry. Saliva around the tablet will help it to dissolve more quickly. Do not eat or drink, smoke or chew tobacco while a tablet is dissolving. If you are not better within 5 minutes after taking ONE dose of nitroglycerin, call 9-1-1 immediately to seek emergency medical care.  Do not take more than 3 nitroglycerin tablets over 15 minutes. If you take this medicine often to relieve symptoms of angina, your doctor or health care professional may provide you with different instructions to manage your symptoms. If symptoms do not go away after following these instructions, it is important to call 9-1-1 immediately. Do not take more than 3 nitroglycerin tablets over 15 minutes. Talk to your pediatrician regarding the use of this medicine in children. Special care may be needed. Overdosage: If you think you have taken too much of this medicine contact a poison control center or emergency room at once. NOTE: This medicine is only for you. Do not share this medicine with others. What if I miss a dose? This does not apply. This medicine is only used as needed. What may interact with this medicine? Do not take this medicine with any of the following medications:  certain migraine medicines like ergotamine and dihydroergotamine (DHE)  medicines used to treat erectile dysfunction like sildenafil, tadalafil, and vardenafil  riociguat This medicine may also interact with the following medications:  alteplase  aspirin  heparin  medicines for high blood pressure  medicines for mental depression  other medicines used to treat angina  phenothiazines like chlorpromazine, mesoridazine, prochlorperazine, thioridazine This list may not describe all possible interactions. Give your health care provider a list of all the medicines, herbs, non-prescription drugs, or dietary supplements you use. Also tell them if you smoke, drink alcohol, or use illegal drugs. Some items may interact with your medicine. What should I watch for while using this medicine? Tell your doctor or health care professional if you feel your medicine is no longer working. Keep this medicine with you at all times. Sit or lie down when you take your medicine to prevent falling if you feel dizzy or faint after  using it. Try to remain calm. This will help you to feel better faster. If you feel dizzy, take several deep breaths and lie down with your feet propped up, or bend forward with your head resting between your knees. You may get drowsy or dizzy. Do not drive, use machinery, or do anything that needs mental alertness until you know how this drug affects you. Do not stand or sit up quickly, especially if you are an older patient. This reduces the risk of dizzy or fainting spells. Alcohol can make you more drowsy and dizzy. Avoid alcoholic drinks. Do not treat yourself for coughs, colds, or pain while you are taking this medicine without asking your doctor or health care professional for advice. Some ingredients may increase your blood pressure. What side effects may I notice from receiving this medicine? Side effects that you should report to your doctor or health care professional as soon as possible:  blurred vision  dry mouth  skin rash  sweating  the feeling of extreme pressure in the head  unusually weak or tired Side effects that usually do not require medical attention (report to your doctor or health care professional if they continue or are bothersome):  flushing of the face or neck  headache  irregular heartbeat, palpitations  nausea, vomiting This list may not describe all possible side effects. Call your doctor for medical advice about side effects. You may report side effects to FDA at 1-800-FDA-1088. Where should I keep my medicine? Keep out of the reach of children. Store at room temperature between 20 and 25 degrees C (68 and 77 degrees F). Store in Chief of Staff. Protect from light and moisture. Keep tightly closed. Throw away any unused medicine after the expiration date. NOTE: This sheet is a summary. It may not cover all possible information. If you have questions about this medicine, talk to your doctor, pharmacist, or health care provider.  2020 Elsevier/Gold  Standard (2013-03-13 17:57:36)

## 2019-08-28 ENCOUNTER — Telehealth: Payer: Self-pay | Admitting: *Deleted

## 2019-08-28 NOTE — Telephone Encounter (Signed)
Patient given detailed instructions per Myocardial Perfusion Study Information Sheet for the test on 09/04/2019 at 1115. Patient notified to arrive 15 minutes early and that it is imperative to arrive on time for appointment to keep from having the test rescheduled.  If you need to cancel or reschedule your appointment, please call the office within 24 hours of your appointment. . Patient verbalized understanding.[name] [name], [name] No mychart available

## 2019-09-02 ENCOUNTER — Other Ambulatory Visit: Payer: Self-pay

## 2019-09-02 ENCOUNTER — Ambulatory Visit (INDEPENDENT_AMBULATORY_CARE_PROVIDER_SITE_OTHER): Payer: Medicare Other

## 2019-09-02 DIAGNOSIS — R0602 Shortness of breath: Secondary | ICD-10-CM

## 2019-09-02 DIAGNOSIS — R6889 Other general symptoms and signs: Secondary | ICD-10-CM | POA: Diagnosis not present

## 2019-09-02 NOTE — Progress Notes (Signed)
Complete echocardiogram performed.  Jimmy Tyren Dugar RDCS, RVT  

## 2019-09-03 ENCOUNTER — Telehealth: Payer: Self-pay

## 2019-09-03 NOTE — Telephone Encounter (Signed)
    I went in pt's chart to see who called pt about her Echo results

## 2019-09-04 ENCOUNTER — Ambulatory Visit (INDEPENDENT_AMBULATORY_CARE_PROVIDER_SITE_OTHER): Payer: Medicare Other

## 2019-09-04 ENCOUNTER — Other Ambulatory Visit: Payer: Self-pay

## 2019-09-04 DIAGNOSIS — R079 Chest pain, unspecified: Secondary | ICD-10-CM

## 2019-09-04 LAB — MYOCARDIAL PERFUSION IMAGING
LV dias vol: 65 mL (ref 46–106)
LV sys vol: 21 mL
Peak HR: 90 {beats}/min
Rest HR: 63 {beats}/min
SDS: 1
SRS: 6
SSS: 7
TID: 0.83

## 2019-09-04 MED ORDER — TECHNETIUM TC 99M TETROFOSMIN IV KIT
30.6000 | PACK | Freq: Once | INTRAVENOUS | Status: AC | PRN
  Administered 2019-09-04: 30.6 via INTRAVENOUS

## 2019-09-04 MED ORDER — TECHNETIUM TC 99M TETROFOSMIN IV KIT
10.5000 | PACK | Freq: Once | INTRAVENOUS | Status: AC | PRN
  Administered 2019-09-04: 10.5 via INTRAVENOUS

## 2019-09-04 MED ORDER — TECHNETIUM TC 99M TETROFOSMIN IV KIT: 30.6000 | PACK | Freq: Once | INTRAVENOUS | Status: DC | PRN

## 2019-09-04 MED ORDER — REGADENOSON 0.4 MG/5ML IV SOLN
0.4000 mg | Freq: Once | INTRAVENOUS | Status: AC
  Administered 2019-09-04: 0.4 mg via INTRAVENOUS

## 2019-09-05 ENCOUNTER — Telehealth: Payer: Self-pay

## 2019-09-05 ENCOUNTER — Encounter: Payer: Self-pay | Admitting: Gastroenterology

## 2019-09-05 NOTE — Telephone Encounter (Signed)
Spoke with patient regarding results.  Patient verbalizes understanding and is agreeable to plan of care. Advised patient to call back with any issues or concerns.  

## 2019-09-05 NOTE — Telephone Encounter (Signed)
-----   Message from Berniece Salines, DO sent at 09/04/2019  9:23 PM EDT ----- Normal study. Please notify patient

## 2019-09-11 ENCOUNTER — Other Ambulatory Visit: Payer: Self-pay

## 2019-09-11 ENCOUNTER — Encounter: Payer: Self-pay | Admitting: Legal Medicine

## 2019-09-11 ENCOUNTER — Ambulatory Visit (INDEPENDENT_AMBULATORY_CARE_PROVIDER_SITE_OTHER): Payer: Medicare Other | Admitting: Legal Medicine

## 2019-09-11 VITALS — BP 150/90 | HR 70 | Temp 97.0°F | Resp 16 | Ht 62.0 in | Wt 166.4 lb

## 2019-09-11 DIAGNOSIS — I10 Essential (primary) hypertension: Secondary | ICD-10-CM | POA: Diagnosis not present

## 2019-09-11 DIAGNOSIS — F5101 Primary insomnia: Secondary | ICD-10-CM | POA: Diagnosis not present

## 2019-09-11 DIAGNOSIS — I5189 Other ill-defined heart diseases: Secondary | ICD-10-CM | POA: Diagnosis not present

## 2019-09-11 DIAGNOSIS — R6889 Other general symptoms and signs: Secondary | ICD-10-CM | POA: Diagnosis not present

## 2019-09-11 MED ORDER — METOPROLOL TARTRATE 25 MG PO TABS
25.0000 mg | ORAL_TABLET | Freq: Two times a day (BID) | ORAL | 2 refills | Status: DC
Start: 2019-09-11 — End: 2019-11-06

## 2019-09-11 MED ORDER — ZOLPIDEM TARTRATE 5 MG PO TABS: 5.0000 mg | ORAL_TABLET | Freq: Every evening | ORAL | 1 refills | Status: DC | PRN

## 2019-09-11 NOTE — Assessment & Plan Note (Addendum)
Found on 2 D echocardiogram, normal systolic function.  Metoprolol added. Nurse check BP in 2 weeks

## 2019-09-11 NOTE — Progress Notes (Signed)
Established Patient Office Visit  Subjective:  Patient ID: CAMESHIA TETTER, female    DOB: 09/21/1945  Age: 74 y.o. MRN: [id number]  CC:  Chief Complaint  Patient presents with  . Hypertension    HPI Blakeleigh Gorra Keshishyan presents for hypertension.  Patient presents for follow up of hypertension.  Patient tolerating lisinopril/hctz and amlodipine. well with side effects.  Patient was diagnosed with hypertension 2010 so has been treated for hypertension for 10 years.Patient is working on maintaining diet and exercise regimen and follows up as directed. Complication include none.Amlodipine was started last month  Past Medical History:  Diagnosis Date  . Arthritis   . Asthma   . Bowel trouble   . Chronic left shoulder pain 09/27/2015  . Chronic pain in female pelvis   . Colon disorder   . COPD (chronic obstructive pulmonary disease) ([name])   . Depression   . Diabetes mellitus without complication ([place])   . Dyspnea on exertion   . Enlarged aorta ([place])   . Esophageal dysphagia   . Eye problem   . Fibromyalgia   . Frequent headaches   . GERD (gastroesophageal reflux disease)   . Heart disease   . History of colon polyps   . Hyperlipidemia   . Hypertension   . IBS (irritable bowel syndrome)   . Insomnia   . Joint pain   . Lupus ([place]) 12/2017  . Myocardial infarction ([place])    mild-heart attack  pt.living in [place] at the time  . Osteoarthritis   . Pain of right hip joint 11/22/2015  . Primary osteoarthritis of left knee 02/04/2015  . Rash   . S/P total knee arthroplasty 02/15/2015  . Sleep trouble   . SOB (shortness of breath)   . Weakness     Past Surgical History:  Procedure Laterality Date  . ABDOMINAL HYSTERECTOMY    . BREAST BIOPSY Right   . CHOLECYSTECTOMY    . COLONOSCOPY  09/04/2011   Colonic polyps, status post polyectomy. Incidental small ascending colon lipoma  . ESOPHAGOGASTRODUODENOSCOPY  09/08/2015   Schatzki ring status post esophageal dilitation. Small hiatal  hernia  . hemorrhoid surgery    . KNEE SURGERY     left  . TOTAL KNEE ARTHROPLASTY Left 02/15/2015   Procedure: TOTAL KNEE ARTHROPLASTY;  Surgeon: Vickey Huger, MD;  Location: [facility];  Service: Orthopedics;  Laterality: Left;  . TUBAL LIGATION    . WRIST SURGERY     right    Family History  Problem Relation Age of Onset  . Heart disease Mother   . Cancer Father   . Diabetes Father   . Heart disease Father   . Stroke Father   . Seizures Son     Social History   Socioeconomic History  . Marital status: Widowed    Spouse name: Not on file  . Number of children: 4  . Years of education: Not on file  . Highest education level: Not on file  Occupational History  . Occupation: Retired  Tobacco Use  . Smoking status: Current Every Day Smoker    Packs/day: 1.00    Years: 47.00    Pack years: 47.00    Types: Cigarettes  . Smokeless tobacco: Never Used  Substance and Sexual Activity  . Alcohol use: No  . Drug use: No  . Sexual activity: Not Currently  Other Topics Concern  . Not on file  Social History Narrative  . Not on file   Social Determinants of  Health   Financial Resource Strain:   . Difficulty of Paying Living Expenses:   Food Insecurity:   . Worried About Charity fundraiser in the Last Year:   . Arboriculturist in the Last Year:   Transportation Needs:   . Film/video editor (Medical):   Marland Kitchen Lack of Transportation (Non-Medical):   Physical Activity:   . Days of Exercise per Week:   . Minutes of Exercise per Session:   Stress:   . Feeling of Stress :   Social Connections:   . Frequency of Communication with Friends and Family:   . Frequency of Social Gatherings with Friends and Family:   . Attends Religious Services:   . Active Member of Clubs or Organizations:   . Attends Archivist Meetings:   Marland Kitchen Marital Status:   Intimate Partner Violence:   . Fear of Current or Ex-Partner:   . Emotionally Abused:   Marland Kitchen Physically Abused:   . Sexually  Abused:     Outpatient Medications Prior to Visit  Medication Sig Dispense Refill  . albuterol (VENTOLIN HFA) 108 (90 Base) MCG/ACT inhaler Inhale 2 puffs into the lungs every 6 (six) hours as needed for wheezing or shortness of breath. 8 g 6  . amLODipine (NORVASC) 5 MG tablet Take 1 tablet (5 mg total) by mouth daily. 90 tablet 3  . atorvastatin (LIPITOR) 40 MG tablet     . Budeson-Glycopyrrol-Formoterol 160-9-4.8 MCG/ACT AERO Inhale 2 puffs into the lungs in the morning and at bedtime. 10.7 g 6  . buPROPion (WELLBUTRIN XL) 150 MG 24 hr tablet Take 150 mg by mouth daily.    Marland Kitchen ibuprofen (ADVIL) 600 MG tablet ibuprofen 600 mg tablet  TK 1 T PO TID    . lisinopril-hydrochlorothiazide (PRINZIDE,ZESTORETIC) 20-12.5 MG per tablet Take 2 tablets by mouth daily.    . nitroGLYCERIN (NITROSTAT) 0.4 MG SL tablet Place 1 tablet (0.4 mg total) under the tongue every 5 (five) minutes as needed for chest pain. 25 tablet 3  . omeprazole (PRILOSEC) 40 MG capsule Take 40 mg by mouth daily as needed.     Marland Kitchen oxyCODONE-acetaminophen (PERCOCET) 10-325 MG tablet Take 1 tablet by mouth every 6 (six) hours as needed.    . metoprolol tartrate (LOPRESSOR) 25 MG tablet Take 25 mg by mouth 2 (two) times daily.    . traZODone (DESYREL) 100 MG tablet Take 100 mg by mouth at bedtime.    Marland Kitchen zolpidem (AMBIEN) 5 MG tablet Take 5 mg by mouth at bedtime as needed.     No facility-administered medications prior to visit.    Allergies  Allergen Reactions  . Meperidine Anaphylaxis    Blood pressure dropped  . Influenza Vaccines     Flu like symptoms    ROS Review of Systems  Constitutional: Negative.   HENT: Negative.   Eyes: Negative.   Respiratory: Negative.   Cardiovascular: Negative.   Gastrointestinal: Negative.   Endocrine: Negative.   Genitourinary: Negative.   Musculoskeletal: Negative.   Skin: Negative.   Neurological: Negative.   Hematological: Negative.   Psychiatric/Behavioral: Negative.         Objective:    Physical Exam  BP (!) 150/90   Pulse 70   Temp (!) 97 F (36.1 C)   Resp 16   Ht 5\' 2"  (1.575 m)   Wt 166 lb 6.4 oz (75.5 kg)   SpO2 96%   BMI 30.43 kg/m  Wt Readings from Last 3 Encounters:  09/11/19 166 lb 6.4 oz (75.5 kg)  09/04/19 167 lb (75.8 kg)  08/19/19 167 lb (75.8 kg)     Health Maintenance Due  Topic Date Due  . Hepatitis C Screening  Never done  . OPHTHALMOLOGY EXAM  Never done  . TETANUS/TDAP  Never done  . MAMMOGRAM  Never done  . COLONOSCOPY  Never done  . DEXA SCAN  Never done  . PNA vac Low Risk Adult (1 of 2 - PCV13) Never done    There are no preventive care reminders to display for this patient.  No results found for: TSH Lab Results  Component Value Date   WBC 7.9 08/11/2019   HGB 14.1 08/11/2019   HCT 40.9 08/11/2019   MCV 93 08/11/2019   PLT 179 08/11/2019   Lab Results  Component Value Date   NA 140 08/11/2019   K 4.1 08/11/2019   CO2 25 08/11/2019   GLUCOSE 90 08/11/2019   BUN 15 08/11/2019   CREATININE 0.85 08/11/2019   BILITOT 0.3 08/11/2019   ALKPHOS 88 08/11/2019   AST 17 08/11/2019   ALT 9 08/11/2019   PROT 6.4 08/11/2019   ALBUMIN 4.1 08/11/2019   CALCIUM 9.9 08/11/2019   ANIONGAP 6 02/16/2015   Lab Results  Component Value Date   CHOL 160 08/11/2019   Lab Results  Component Value Date   HDL 65 08/11/2019   Lab Results  Component Value Date   LDLCALC 81 08/11/2019   Lab Results  Component Value Date   TRIG 74 08/11/2019   Lab Results  Component Value Date   CHOLHDL 2.5 08/11/2019   Lab Results  Component Value Date   HGBA1C 5.1 08/11/2019      Assessment & Plan:   Problem List Items Addressed This Visit      Cardiovascular and Mediastinum   Essential hypertension - Primary    An individual hypertension care plan was established and reinforced today.  The patient's status was assessed using clinical findings on exam and labs or diagnostic tests. The patient's success at meeting  treatment goals on disease specific evidence-based guidelines and found to be fair controlled. SELF MANAGEMENT: The patient and I together assessed ways to personally work towards obtaining the recommended goals. RECOMMENDATIONS: avoid decongestants found in common cold remedies, decrease consumption of alcohol, perform routine monitoring of BP with home BP cuff, exercise, reduction of dietary salt, take medicines as prescribed, try not to miss doses and quit smoking.  Regular exercise and maintaining a healthy weight is needed.  Stress reduction may help. A CLINICAL SUMMARY including written plan identify barriers to care unique to individual due to social or financial issues.  We attempt to mutually creat solutions for individual and family understanding. Metoprolol 25 mg bid added for BP and diastolic dysfuncion.      Relevant Medications   metoprolol tartrate (LOPRESSOR) 25 MG tablet     Other   Diastolic dysfunction    Found on 2 D echocardiogram, normal systolic function.  Metoprolol added. Nurse check BP in 2 weeks       Other Visit Diagnoses    Primary insomnia       Relevant Medications   zolpidem (AMBIEN) 5 MG tablet      Meds ordered this encounter  Medications  . metoprolol tartrate (LOPRESSOR) 25 MG tablet    Sig: Take 1 tablet (25 mg total) by mouth 2 (two) times daily.    Dispense:  180 tablet    Refill:  2  . zolpidem (AMBIEN) 5 MG tablet    Sig: Take 1 tablet (5 mg total) by mouth at bedtime as needed.    Dispense:  30 tablet    Refill:  1    Follow-up: Return in about 2 weeks (around 09/25/2019), or nurse visit for BP.    [name], MD

## 2019-09-11 NOTE — Patient Instructions (Signed)
Start metoprolol 25 mg twice a day for BP Stop trazodone May use ambien 5mg  at night As needed for sleep

## 2019-09-11 NOTE — Assessment & Plan Note (Addendum)
An individual hypertension care plan was established and reinforced today.  The patient's status was assessed using clinical findings on exam and labs or diagnostic tests. The patient's success at meeting treatment goals on disease specific evidence-based guidelines and found to be fair controlled. SELF MANAGEMENT: The patient and I together assessed ways to personally work towards obtaining the recommended goals. RECOMMENDATIONS: avoid decongestants found in common cold remedies, decrease consumption of alcohol, perform routine monitoring of BP with home BP cuff, exercise, reduction of dietary salt, take medicines as prescribed, try not to miss doses and quit smoking.  Regular exercise and maintaining a healthy weight is needed.  Stress reduction may help. A CLINICAL SUMMARY including written plan identify barriers to care unique to individual due to social or financial issues.  We attempt to mutually creat solutions for individual and family understanding. Metoprolol 25 mg bid added for BP and diastolic dysfuncion.

## 2019-09-17 ENCOUNTER — Other Ambulatory Visit: Payer: Self-pay

## 2019-09-17 MED ORDER — OMEPRAZOLE 40 MG PO CPDR: 40.0000 mg | DELAYED_RELEASE_CAPSULE | Freq: Every day | ORAL | 2 refills | Status: DC | PRN

## 2019-09-22 ENCOUNTER — Institutional Professional Consult (permissible substitution): Payer: Medicare Other | Admitting: Internal Medicine

## 2019-09-23 ENCOUNTER — Institutional Professional Consult (permissible substitution): Payer: Medicare Other | Admitting: Internal Medicine

## 2019-09-24 ENCOUNTER — Ambulatory Visit: Payer: Medicare Other

## 2019-09-25 ENCOUNTER — Ambulatory Visit (INDEPENDENT_AMBULATORY_CARE_PROVIDER_SITE_OTHER): Payer: Medicare Other | Admitting: Gastroenterology

## 2019-09-25 ENCOUNTER — Encounter: Payer: Self-pay | Admitting: Gastroenterology

## 2019-09-25 VITALS — BP 144/94 | HR 58 | Temp 97.3°F | Ht 62.0 in | Wt 175.2 lb

## 2019-09-25 DIAGNOSIS — Z86010 Personal history of colonic polyps: Secondary | ICD-10-CM | POA: Diagnosis not present

## 2019-09-25 DIAGNOSIS — R112 Nausea with vomiting, unspecified: Secondary | ICD-10-CM

## 2019-09-25 DIAGNOSIS — R1030 Lower abdominal pain, unspecified: Secondary | ICD-10-CM | POA: Diagnosis not present

## 2019-09-25 DIAGNOSIS — R6889 Other general symptoms and signs: Secondary | ICD-10-CM | POA: Diagnosis not present

## 2019-09-25 DIAGNOSIS — Z01818 Encounter for other preprocedural examination: Secondary | ICD-10-CM

## 2019-09-25 DIAGNOSIS — R1013 Epigastric pain: Secondary | ICD-10-CM

## 2019-09-25 MED ORDER — PROMETHAZINE HCL 25 MG PO TABS: 25.0000 mg | ORAL_TABLET | Freq: Four times a day (QID) | ORAL | 0 refills | Status: DC | PRN

## 2019-09-25 NOTE — Patient Instructions (Addendum)
If you are age 74 or older, your body mass index should be between 23-30. Your Body mass index is 32.05 kg/m. If this is out of the aforementioned range listed, please consider follow up with your Primary Care Provider.  If you are age 26 or younger, your body mass index should be between 19-25. Your Body mass index is 32.05 kg/m. If this is out of the aformentioned range listed, please consider follow up with your Primary Care Provider.   You have been scheduled for an endoscopy and colonoscopy. Please follow the written instructions given to you at your visit today. Please pick up your prep supplies at the pharmacy within the next 1-3 days. If you use inhalers (even only as needed), please bring them with you on the day of your procedure. Your physician has requested that you go to www.startemmi.com and enter the access code given to you at your visit today. This web site gives a general overview about your procedure. However, you should still follow specific instructions given to you by our office regarding your preparation for the procedure.  You have been [facility] CT [facility] [place] [address] [address]).   You are scheduled on 10/02/19 at [place] should arrive 15 minutes prior to your appointment time for registration. Please follow the written instructions below on the day of your exam:  WARNING: IF YOU ARE ALLERGIC TO IODINE/X-RAY DYE, PLEASE NOTIFY RADIOLOGY IMMEDIATELY AT [phone]! YOU WILL BE GIVEN A 13 HOUR PREMEDICATION PREP.  1) Do not eat or drink anything after 5am (4 hours prior to your test) 2) You have been given 2 bottles of oral contrast to drink. The solution may taste better if refrigerated, but do NOT add ice or any other liquid to this solution. Shake well before drinking.    Drink 1 bottle of contrast @ 7am (2 hours prior to your exam)  Drink 1 bottle of contrast @ 8am (1 hour  prior to your exam)  You may take any medications as prescribed with a small amount of water, if necessary. If you take any of the following medications: METFORMIN, GLUCOPHAGE, GLUCOVANCE, AVANDAMET, RIOMET, FORTAMET, [place] MET, JANUMET, GLUMETZA or METAGLIP, you MAY be asked to HOLD this medication 48 hours AFTER the exam.  The purpose of you drinking the oral contrast is to aid in the visualization of your intestinal tract. The contrast solution may cause some diarrhea. Depending on your individual set of symptoms, you may also receive an intravenous injection of x-ray contrast/dye. Plan on being at [facility] for 30 minutes or longer, depending on the type of exam you are having performed.  This test typically takes 30-45 minutes to complete.  If you have any questions regarding your exam or if you need to reschedule, you may call the CT department at [phone] between the hours of 8:00 am and 5:00 pm, Monday-Friday.  ________________________________________________________________________  We have sent the following medications to your pharmacy for you to pick up at your convenience: Phenergan  Please go to the lab at [facility] ([place] You will need to go to level "B", you do not need an appointment for this. Hours available are 7:30 am - 4:30 pm.  You will need to have your labs drawn before the day of your CT scan or your Ct scan will be canceled  Thank you,  Dr. Jackquline Denmark

## 2019-09-25 NOTE — Progress Notes (Signed)
Chief Complaint: N/V  Referring Provider:  Lillard Anes,*      ASSESSMENT AND PLAN;   #1. Epi pain with n/v  #2. Lower abdo pain  #3. Chronic back pain followed by pain management (on percocet 1TID)  #4.  Chronic constipation likely d/t narcotics.    #4. H/O polyps   Plan: -CBC, CMP, celiac, TSH and lipase. -CT AP with p.o. and IV contrast. -Phenergan 25mg  poQ6hrs (30). Sedation precautios -Continue omeprazole 40 mg p.o. once a day. -EGD and colon for further evaluation.  She would like to get both procedures done at the same time if she can tolerate preparation.  I have explained risks and benefits in detail.  She wishes to proceed. -Minimize pain medications.   HPI:    [name] [name] is a 74 y.o. female  Upper and lower abdo pain-gets worse after eating.  This has been associated with constipation, abdominal bloating, back pain. With intermittent N/V Denies having any odynophagia or dysphagia.  No fever chills or night sweats.  No jaundice dark urine or pale stools.  She denies use of nonsteroidals. Unfortunately she is on Percocet 3 times daily from pain management due to chronic back pain.  She also had a recent fall with bruising of left chest.  Has previous history of chronic constipation which is better currently  Sent to GI clinic for further evaluation.  She is also over due for repeat colonoscopy.   Past GI procedures: CT AP 10/2016: Colonic diverticulosis without diverticulitis, small intrarenal stones, aortic atherosclerosis.  Advanced DJD back. EGD 08/2015: Schatzki's ring s/p dilatation, small hiatal hernia. Neg eso Bx, neg CLO Colonoscopy 08/2011 ([facility]) colonic polyps SP polypectomy, incidental small ascending colonic lipoma, pancolonic diverticulosis predominantly in the sigmoid colon. Bx- TA. Rpt in 5 yrs  Past Medical History:  Diagnosis Date  . Arthritis   . Asthma   . Bowel trouble   . Chronic left shoulder pain 09/27/2015  . Chronic pain  in female pelvis   . Colon disorder   . COPD (chronic obstructive pulmonary disease) ([place])   . Depression   . Diabetes mellitus without complication ([place])   . Dyspnea on exertion   . Enlarged aorta ([place])   . Esophageal dysphagia   . Eye problem   . Fibromyalgia   . Frequent headaches   . GERD (gastroesophageal reflux disease)   . Heart disease   . History of colon polyps   . History of kidney stones   . Hyperlipidemia   . Hypertension   . IBS (irritable bowel syndrome)   . Insomnia   . Joint pain   . Lupus ([place]) 12/2017  . Myocardial infarction ([place])    mild-heart attack  pt.living in [place] at the time  . Osteoarthritis   . Pain of right hip joint 11/22/2015  . Primary osteoarthritis of left knee 02/04/2015  . Rash   . S/P total knee arthroplasty 02/15/2015  . Sleep trouble   . SOB (shortness of breath)   . Weakness     Past Surgical History:  Procedure Laterality Date  . ABDOMINAL HYSTERECTOMY    . BREAST BIOPSY Right   . CHOLECYSTECTOMY    . COLONOSCOPY  09/04/2011   Colonic polyps, status post polyectomy. Incidental small ascending colon lipoma  . ESOPHAGOGASTRODUODENOSCOPY  09/08/2015   Schatzki ring status post esophageal dilitation. Small hiatal hernia  . hemorrhoid surgery    . KNEE SURGERY     left  . TOTAL KNEE  ARTHROPLASTY Left 02/15/2015   Procedure: TOTAL KNEE ARTHROPLASTY;  Surgeon: Vickey Huger, MD;  Location: [facility];  Service: Orthopedics;  Laterality: Left;  . TUBAL LIGATION    . WRIST SURGERY     right    Family History  Problem Relation Age of Onset  . Heart disease Mother   . Cancer Father        unknown  . Diabetes Father   . Heart disease Father   . Stroke Father   . Seizures Son     Social History   Tobacco Use  . Smoking status: Current Every Day Smoker    Packs/day: 1.00    Years: 47.00    Pack years: 47.00    Types: Cigarettes  . Smokeless tobacco: Never Used  Substance Use Topics  . Alcohol use: No  . Drug use: No     Current Outpatient Medications  Medication Sig Dispense Refill  . albuterol (VENTOLIN HFA) 108 (90 Base) MCG/ACT inhaler Inhale 2 puffs into the lungs every 6 (six) hours as needed for wheezing or shortness of breath. 8 g 6  . atorvastatin (LIPITOR) 40 MG tablet     . Budeson-Glycopyrrol-Formoterol 160-9-4.8 MCG/ACT AERO Inhale 2 puffs into the lungs in the morning and at bedtime. 10.7 g 6  . ibuprofen (ADVIL) 600 MG tablet Take 600 mg by mouth as needed.     . metoprolol tartrate (LOPRESSOR) 25 MG tablet Take 1 tablet (25 mg total) by mouth 2 (two) times daily. 180 tablet 2  . nitroGLYCERIN (NITROSTAT) 0.4 MG SL tablet Place 1 tablet (0.4 mg total) under the tongue every 5 (five) minutes as needed for chest pain. 25 tablet 3  . omeprazole (PRILOSEC) 40 MG capsule Take 1 capsule (40 mg total) by mouth daily as needed. 30 capsule 2  . oxyCODONE-acetaminophen (PERCOCET) 10-325 MG tablet Take 1 tablet by mouth every 6 (six) hours as needed.    . sertraline (ZOLOFT) 50 MG tablet Take 50 mg by mouth daily.    Marland Kitchen zolpidem (AMBIEN) 5 MG tablet Take 1 tablet (5 mg total) by mouth at bedtime as needed. 30 tablet 1  . amLODipine (NORVASC) 5 MG tablet Take 1 tablet (5 mg total) by mouth daily. 90 tablet 3  . lisinopril-hydrochlorothiazide (PRINZIDE,ZESTORETIC) 20-12.5 MG per tablet Take 2 tablets by mouth daily.     No current facility-administered medications for this visit.    Allergies  Allergen Reactions  . Meperidine Anaphylaxis    Blood pressure dropped  . Influenza Vaccines     Flu like symptoms    Review of Systems:  Constitutional: Denies fever, chills, diaphoresis, appetite change and fatigue.  HEENT: Denies photophobia, eye pain, redness, hearing loss, ear pain, congestion, sore throat, rhinorrhea, sneezing, mouth sores, neck pain, neck stiffness and tinnitus.   Respiratory: Has COPD with continued smoking.  Occ SOB, DOE, cough, chest tightness,  and wheezing.   Cardiovascular:  Denies chest pain, palpitations and leg swelling.  Genitourinary: Denies dysuria, urgency, frequency, hematuria, flank pain and difficulty urinating.  Musculoskeletal: Has myalgias, back pain, joint swelling, arthralgias and gait problem.  Skin: No rash.  Neurological: Denies dizziness, seizures, syncope, weakness, light-headedness, numbness and headaches.  Hematological: Denies adenopathy. Easy bruising, personal or family bleeding history  Psychiatric/Behavioral: Has anxiety or depression     Physical Exam:    BP (!) 144/94   Pulse (!) 58   Temp (!) 97.3 F (36.3 C)   Ht 5\' 2"  (1.575 m)   Wt  175 lb 4 oz (79.5 kg)   BMI 32.05 kg/m  Wt Readings from Last 3 Encounters:  09/25/19 175 lb 4 oz (79.5 kg)  09/11/19 166 lb 6.4 oz (75.5 kg)  09/04/19 167 lb (75.8 kg)   Constitutional:  Well-developed, in no acute distress. Psychiatric: Normal mood and affect. Behavior is normal. HEENT: Pupils normal.  Conjunctivae are normal. No scleral icterus. Neck supple.  Cardiovascular: Normal rate, regular rhythm. No edema Pulmonary/chest: Bilateral decreased breath sounds.  No wheezing, rales or rhonchi. Abdominal: Soft, nondistended.  Generalized abdominal wall tenderness.. Bowel sounds active throughout. There are no masses palpable. No hepatomegaly. Rectal:  defered Neurological: Alert and oriented to person place and time. Skin: Skin is warm and dry. No rashes noted.  Data Reviewed: I have personally reviewed following labs and imaging studies  CBC: CBC Latest Ref Rng & Units 08/11/2019 02/17/2015 02/16/2015  WBC 3.4 - 10.8 x10E3/uL 7.9 8.9 8.4  Hemoglobin 11.1 - 15.9 g/dL 14.1 11.0(L) 12.0  Hematocrit 34.0 - 46.6 % 40.9 33.6(L) 36.1  Platelets 150 - 450 x10E3/uL 179 150 144(L)    CMP: CMP Latest Ref Rng & Units 08/11/2019 02/16/2015 02/15/2015  Glucose 65 - 99 mg/dL 90 118(H) -  BUN 8 - 27 mg/dL 15 7 -  Creatinine 0.57 - 1.00 mg/dL 0.85 0.55 0.77  Sodium 134 - 144 mmol/L 140 131(L) -   Potassium 3.5 - 5.2 mmol/L 4.1 3.8 -  Chloride 96 - 106 mmol/L 101 99(L) -  CO2 20 - 29 mmol/L 25 26 -  Calcium 8.7 - 10.3 mg/dL 9.9 8.3(L) -  Total Protein 6.0 - 8.5 g/dL 6.4 - -  Total Bilirubin 0.0 - 1.2 mg/dL 0.3 - -  Alkaline Phos 39 - 117 IU/L 88 - -  AST 0 - 40 IU/L 17 - -  ALT 0 - 32 IU/L 9 - -     Radiology Studies: MYOCARDIAL PERFUSION IMAGING  Result Date: 09/04/2019  Nuclear stress EF: 68%.  The left ventricular ejection fraction is hyperdynamic (>65%).  There was no ST segment deviation noted during stress.  Defect 1: There is a medium defect of moderate severity present in the basal inferolateral and apical lateral location.  This is a low risk study.  No evidence of ischemia or MI.  Normal EF.  Attenuation noted in infero-lateral wall.    ECHOCARDIOGRAM COMPLETE  Result Date: 09/02/2019    ECHOCARDIOGRAM REPORT   Patient Name:   TAMESHIA VONASEK Date of Exam: 09/02/2019 Medical Rec #:  [id number]    Height:       62.0 in Accession #:    [id number]   Weight:       167.0 lb Date of Birth:  1946/05/21    BSA:          1.771 m Patient Age:    24 years     BP:           166/82 mmHg Patient Gender: F            HR:           72 bpm. Exam Location:  [facility] Procedure: 2D Echo Indications:    SOB (shortness of breath) [R06.02 (ICD-10-CM)]  History:        Patient has no prior history of Echocardiogram examinations.                 COPD, Signs/Symptoms:Chest Pain; Risk Factors:Diabetes and  Hypertension.  Sonographer:    Luane School Referring Phys: [id number] [place]  1. Left ventricular ejection fraction, by estimation, is 60 to 65%. The left ventricle has normal function. The left ventricle has no regional wall motion abnormalities. Left ventricular diastolic parameters are consistent with Grade II diastolic dysfunction (pseudonormalization).  2. Right ventricular systolic function is normal. The right ventricular size is normal. There is mildly elevated  pulmonary artery systolic pressure.  3. Left atrial size was mildly dilated.  4. The mitral valve is normal in structure. Mild mitral valve regurgitation. No evidence of mitral stenosis.  5. The aortic valve is normal in structure. Aortic valve regurgitation is not visualized. Mild to moderate aortic valve sclerosis/calcification is present, without any evidence of aortic stenosis.  6. There is mild dilatation of the ascending aorta measuring 39 mm.  7. The inferior vena cava is normal in size with greater than 50% respiratory variability, suggesting right atrial pressure of 3 mmHg. FINDINGS  Left Ventricle: Left ventricular ejection fraction, by estimation, is 60 to 65%. The left ventricle has normal function. The left ventricle has no regional wall motion abnormalities. The left ventricular internal cavity size was normal in size. There is  no left ventricular hypertrophy. Left ventricular diastolic parameters are consistent with Grade II diastolic dysfunction (pseudonormalization). Right Ventricle: The right ventricular size is normal. No increase in right ventricular wall thickness. Right ventricular systolic function is normal. There is mildly elevated pulmonary artery systolic pressure. The tricuspid regurgitant velocity is 2.91  m/s, and with an assumed right atrial pressure of 3 mmHg, the estimated right ventricular systolic pressure is 123456 mmHg. Left Atrium: Left atrial size was mildly dilated. Right Atrium: Right atrial size was normal in size. Pericardium: There is no evidence of pericardial effusion. Mitral Valve: The mitral valve is normal in structure. Normal mobility of the mitral valve leaflets. Mild mitral valve regurgitation. No evidence of mitral valve stenosis. Tricuspid Valve: The tricuspid valve is normal in structure. Tricuspid valve regurgitation is trivial. No evidence of tricuspid stenosis. Aortic Valve: The aortic valve is normal in structure. Aortic valve regurgitation is not visualized.  Mild to moderate aortic valve sclerosis/calcification is present, without any evidence of aortic stenosis. Aortic valve mean gradient measures 10.0 mmHg.  Aortic valve peak gradient measures 20.4 mmHg. Aortic valve area, by VTI measures 1.41 cm. Pulmonic Valve: The pulmonic valve was normal in structure. Pulmonic valve regurgitation is not visualized. No evidence of pulmonic stenosis. Aorta: The aortic root is normal in size and structure. There is mild dilatation of the ascending aorta measuring 39 mm. Venous: The inferior vena cava is normal in size with greater than 50% respiratory variability, suggesting right atrial pressure of 3 mmHg. IAS/Shunts: No atrial level shunt detected by color flow Doppler.  LEFT VENTRICLE PLAX 2D LVIDd:         4.35 cm  Diastology LVIDs:         2.60 cm  LV e' lateral:   7.51 cm/s LV PW:         1.10 cm  LV E/e' lateral: 13.8 LV IVS:        1.10 cm  LV e' medial:    6.85 cm/s LVOT diam:     1.90 cm  LV E/e' medial:  15.2 LV SV:         65 LV SV Index:   37 LVOT Area:     2.84 cm  RIGHT VENTRICLE  IVC RV S prime:     14.60 cm/s  IVC diam: 1.30 cm TAPSE (M-mode): 2.7 cm LEFT ATRIUM             Index       RIGHT ATRIUM           Index LA diam:        4.70 cm 2.65 cm/m  RA Area:     15.30 cm LA Vol (A2C):   67.6 ml 38.18 ml/m RA Volume:   35.70 ml  20.16 ml/m LA Vol (A4C):   57.7 ml 32.59 ml/m LA Biplane Vol: 63.2 ml 35.69 ml/m  AORTIC VALVE AV Area (Vmax):    1.51 cm AV Area (Vmean):   1.49 cm AV Area (VTI):     1.41 cm AV Vmax:           226.00 cm/s AV Vmean:          147.000 cm/s AV VTI:            0.463 m AV Peak Grad:      20.4 mmHg AV Mean Grad:      10.0 mmHg LVOT Vmax:         120.00 cm/s LVOT Vmean:        77.500 cm/s LVOT VTI:          0.231 m LVOT/AV VTI ratio: 0.50  AORTA Ao Root diam: 2.80 cm Ao Asc diam:  3.90 cm MITRAL VALVE                TRICUSPID VALVE MV Area (PHT): 2.83 cm     TR Peak grad:   33.9 mmHg MV Decel Time: 268 msec     TR Vmax:         291.00 cm/s MV E velocity: 104.00 cm/s MV A velocity: 101.00 cm/s  SHUNTS MV E/A ratio:  1.03         Systemic VTI:  0.23 m                             Systemic Diam: 1.90 cm Jenne Campus MD Electronically signed by Jenne Campus MD Signature Date/Time: 09/02/2019/4:41:36 PM    Final       Carmell Austria, MD 09/25/2019, 2:07 PM  Cc: Lillard Anes

## 2019-10-02 ENCOUNTER — Ambulatory Visit (HOSPITAL_BASED_OUTPATIENT_CLINIC_OR_DEPARTMENT_OTHER): Admission: RE | Admit: 2019-10-02 | Payer: Medicare Other | Source: Ambulatory Visit

## 2019-10-03 ENCOUNTER — Encounter: Payer: Self-pay | Admitting: Gastroenterology

## 2019-10-03 ENCOUNTER — Telehealth: Payer: Self-pay | Admitting: Gastroenterology

## 2019-10-06 ENCOUNTER — Ambulatory Visit (HOSPITAL_BASED_OUTPATIENT_CLINIC_OR_DEPARTMENT_OTHER): Admission: RE | Admit: 2019-10-06 | Payer: Medicare Other | Source: Ambulatory Visit

## 2019-10-06 NOTE — Telephone Encounter (Signed)
Patient has canceled this appointment, will call back to reschedule.

## 2019-10-07 ENCOUNTER — Other Ambulatory Visit: Payer: Self-pay

## 2019-10-07 ENCOUNTER — Ambulatory Visit (INDEPENDENT_AMBULATORY_CARE_PROVIDER_SITE_OTHER): Payer: Medicare Other | Admitting: Internal Medicine

## 2019-10-07 ENCOUNTER — Encounter: Payer: Self-pay | Admitting: Internal Medicine

## 2019-10-07 VITALS — BP 160/90 | HR 87 | Temp 97.5°F | Ht 62.0 in | Wt 164.4 lb

## 2019-10-07 DIAGNOSIS — F17210 Nicotine dependence, cigarettes, uncomplicated: Secondary | ICD-10-CM | POA: Diagnosis not present

## 2019-10-07 DIAGNOSIS — J410 Simple chronic bronchitis: Secondary | ICD-10-CM | POA: Diagnosis not present

## 2019-10-07 DIAGNOSIS — F17200 Nicotine dependence, unspecified, uncomplicated: Secondary | ICD-10-CM

## 2019-10-07 DIAGNOSIS — J449 Chronic obstructive pulmonary disease, unspecified: Secondary | ICD-10-CM

## 2019-10-07 DIAGNOSIS — R6889 Other general symptoms and signs: Secondary | ICD-10-CM | POA: Diagnosis not present

## 2019-10-07 DIAGNOSIS — Z716 Tobacco abuse counseling: Secondary | ICD-10-CM

## 2019-10-07 MED ORDER — ALBUTEROL SULFATE (2.5 MG/3ML) 0.083% IN NEBU: 2.5000 mg | INHALATION_SOLUTION | Freq: Four times a day (QID) | RESPIRATORY_TRACT | 12 refills | Status: DC | PRN

## 2019-10-07 NOTE — Progress Notes (Signed)
[name] [name]    [id number]    Aug 22, 1945  Primary Care Physician:Perry, Zeb Comfort, MD  Referring Physician: Lillard Anes, MD [address] [place],  [place] [address] Reason for Consultation: COPD Date of Consultation: 10/07/2019  Chief complaint:   Chief Complaint  Patient presents with  . Consult    COPD, dry cough     HPI:  [name] [name] is a 74 y.o. woman with history of everyday tobacco use disorder who presents for new patient evaluation. Recently diagnosed with COPD and started on Breztri by PCP. (was diagnosed with asthma in the 1990s.)  No childhood respiratory disease. Breathing has been slowly coming on worse. Shortness of breath getting much worse over the last 2 years. Difficulty with ADLs such as grocery shopping. Chores around the house.  Daily cough, worse in the morning. Gets a bout of bronchitis at least 3 times/year. No hospitalizations or ED visits for breathing.   She has episodes of chest pain, tightness and wheezing. Has been on breztri BID since last month which she thinks might help a little but not dramatically. Takes albuterol 3-4 times/day.  Nebulizer machine over 5 years but it is broken was given to her by a doctor's office.   Everyday smoker. Quit once for 8 days while hospitalized. Has cut back from 2 ppd to 1 ppd.   Allergies - cats.   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 Lives independently   Social History   Occupational History  . Occupation: Retired  Tobacco Use  . Smoking status: Current Every Day Smoker    Packs/day: 2.00    Years: 47.00    Pack years: 94.00    Types: Cigarettes  . Smokeless tobacco: Never Used  . Tobacco comment: down to 1 pack a day  Substance and Sexual Activity  . Alcohol use: No  . Drug use: No  . Sexual activity: Not Currently    Relevant family history:  Family History  Problem Relation Age of Onset  . Heart disease Mother   . Cancer Father    unknown  . Diabetes Father   . Heart disease Father   . Stroke Father   . Lupus Sister   . Seizures Son   . COPD Son     Past Medical History:  Diagnosis Date  . Arthritis   . Asthma   . Bowel trouble   . Chronic left shoulder pain 09/27/2015  . Chronic pain in female pelvis   . Colon disorder   . COPD (chronic obstructive pulmonary disease) ([place])   . Depression   . Diabetes mellitus without complication ([place])   . Dyspnea on exertion   . Enlarged aorta ([place])   . Esophageal dysphagia   . Eye problem   . Fibromyalgia   . Frequent headaches   . GERD (gastroesophageal reflux disease)   . Heart disease   . History of colon polyps   . History of kidney stones   . Hyperlipidemia   . Hypertension   . IBS (irritable bowel syndrome)   . Insomnia   . Joint pain   . Lupus ([place]) 12/2017  . Myocardial infarction ([place])    mild-heart attack  pt.living in [place] at the time  . Osteoarthritis   . Pain of right hip joint 11/22/2015  . Primary osteoarthritis of left knee 02/04/2015  . Rash   . S/P total knee arthroplasty 02/15/2015  . Sleep trouble   .  SOB (shortness of breath)   . Weakness     Past Surgical History:  Procedure Laterality Date  . ABDOMINAL HYSTERECTOMY    . BREAST BIOPSY Right   . CHOLECYSTECTOMY    . COLONOSCOPY  09/04/2011   Colonic polyps, status post polyectomy. Incidental small ascending colon lipoma  . ESOPHAGOGASTRODUODENOSCOPY  09/08/2015   Schatzki ring status post esophageal dilitation. Small hiatal hernia  . hemorrhoid surgery    . KNEE SURGERY     left  . TOTAL KNEE ARTHROPLASTY Left 02/15/2015   Procedure: TOTAL KNEE ARTHROPLASTY;  Surgeon: Vickey Huger, MD;  Location: [facility];  Service: Orthopedics;  Laterality: Left;  . TUBAL LIGATION    . WRIST SURGERY     right    Physical Exam: Blood pressure (!) 160/90, pulse 87, temperature (!) 97.5 F (36.4 C), temperature source Temporal, height 5\' 2"  (1.575 m), weight 164 lb 6.4 oz (74.6 kg), SpO2 97  %. Gen:      No acute distress Lungs:    No increased respiratory effort, symmetric chest wall excursion, clear to auscultation bilaterally, no wheezes or crackles CV:         Regular rate and rhythm; no murmurs, rubs, or gallops.  No pedal edema MSK: no acute synovitis of DIP or PIP joints, no mechanics hands.  Skin:      Warm and dry; bilateral varicosities Neuro: normal speech, no focal facial asymmetry Psych: alert and oriented x3, normal mood and affect  Data Reviewed/Medical Decision Making:  Independent interpretation of tests: Imaging: . Review of patient's chest xray images revealed barrel chest consistent with COPD. The patient's images have been independently reviewed by me.    PFTs: None on file  Labs:  Lab Results  Component Value Date   WBC 7.9 08/11/2019   HGB 14.1 08/11/2019   HCT 40.9 08/11/2019   MCV 93 08/11/2019   PLT 179 08/11/2019   Lab Results  Component Value Date   NA 140 08/11/2019   K 4.1 08/11/2019   CL 101 08/11/2019   CO2 25 08/11/2019    Immunization status:  Immunization History  Administered Date(s) Administered  . Influenza, High Dose Seasonal PF 03/28/2018   . I reviewed prior external note(s) from Dr. Henrene Pastor . I reviewed the result(s) of the labs and imaging as noted above.  . I have ordered pfts  Assessment:  COPD progressing Tobacco Use Disorder   Plan/Recommendations: Continue Breztri, albuterol Will prescribe home nebulizer machine for prn albuterol Referral to lung cancer screening program today.  I personally spent 8 minutes counseling the patient regarding tobacco use disorder.  Patient is symptomatic from tobacco use disorder due to the following condition: COPD.  The patient's response was pre-contemplative.  We discussed nicotine replacement therapy, Wellbutrin, Chantix.  We identified to gather patient specific barriers to change.  The patient is open to future discussions about tobacco cessation.  We discussed  disease management and progression at length today.   I spent 60 minutes in the care of this patient today including pre-charting, chart review, review of results, face-to-face care, coordination of care and communication with consultants etc.).  Return to Care: Return in about 4 months (around 02/07/2020).  Lenice Llamas, MD Pulmonary and [place]  CC: Lillard Anes,*

## 2019-10-07 NOTE — Patient Instructions (Addendum)
The patient should have follow up scheduled in 4 months with myself - set recall Follow up with Lung cancer screening program  Prior to next visit patient should have: Full set of PFTs  What are the benefits of quitting smoking? Quitting smoking can lower your chances of getting or dying from heart disease, lung disease, kidney failure, infection, or cancer. It can also lower your chances of getting osteoporosis, a condition that makes your bones weak. Plus, quitting smoking can help your skin look younger and reduce the chances that you will have problems with sex.  Quitting smoking will improve your health no matter how old you are, and no matter how long or how much you have smoked.  What should I do if I want to quit smoking? The letters in the word "START" can help you remember the steps to take: S = Set a quit date. T = Tell family, friends, and the people around you that you plan to quit. A = Anticipate or plan ahead for the tough times you'll face while quitting. R = Remove cigarettes and other tobacco products from your home, car, and work. T = Talk to your doctor about getting help to quit.  How can my doctor or nurse help? Your doctor or nurse can give you advice on the best way to quit. He or she can also put you in touch with counselors or other people you can call for support. Plus, your doctor or nurse can give you medicines to: ?Reduce your craving for cigarettes ?Reduce the unpleasant symptoms that happen when you stop smoking (called "withdrawal symptoms"). You can also get help from a free phone line (1-800-QUIT-NOW) or go online to [employer].  What are the symptoms of withdrawal? The symptoms include: ?Trouble sleeping ?Being irritable, anxious or restless ?Getting frustrated or angry ?Having trouble thinking clearly  Some people who stop smoking become temporarily depressed. Some people need treatment for depression, such as counseling or antidepressant  medicines. Depressed people might: ?No longer enjoy or care about doing the things they used to like to do ?Feel sad, down, hopeless, nervous, or cranky most of the day, almost every day ?Lose or gain weight ?Sleep too much or too little ?Feel tired or like they have no energy ?Feel guilty or like they are worth nothing ?Forget things or feel confused ?Move and speak more slowly than usual ?Act restless or have trouble staying still ?Think about death or suicide  If you think you might be depressed, see your doctor or nurse. Only someone trained in mental health can tell for sure if you are depressed. If you ever feel like you might hurt yourself, go straight to the nearest emergency department. Or you can call for an ambulance (in the [place] and [place], [employer] 9-1-1) or call your doctor or nurse right away and tell them it is an emergency. You can also reach the [place] National Suicide Prevention Lifeline at 769-191-3435 or http://walker-sanchez.info/.  How do medicines help you stop smoking? Different medicines work in different ways: ?Nicotine replacement therapy eases withdrawal and reduces your body's craving for nicotine, the main drug found in cigarettes. There are different forms of nicotine replacement, including skin patches, lozenges, gum, nasal sprays, and "puffers" or inhalers. Many can be bought without a prescription, while others might require one. ?Bupropion is a prescription medicine that reduces your desire to smoke. This medicine is sold under the brand names Zyban and Wellbutrin. It is also available in a generic version, which  is cheaper than brand name medicines. ?Varenicline (brand names: Chantix, Champix) is a prescription medicine that reduces withdrawal symptoms and cigarette cravings. If you think you'd like to take varenicline and you have a history of depression, anxiety, or heart disease, discuss this with your doctor or nurse before taking the medicine. Varenicline  can also increase the effects of alcohol in some people. It's a good idea to limit drinking while you're taking it, at least until you know how it affects you.  How does counseling work? Counseling can happen during formal office visits or just over the phone. A counselor can help you: ?Figure out what triggers your smoking and what to do instead ?Overcome cravings ?Figure out what went wrong when you tried to quit before  What works best? Studies show that people have the best luck at quitting if they take medicines to help them quit and work with a Social worker. It might also be helpful to combine nicotine replacement with one of the prescription medicines that help people quit. In some cases, it might even make sense to take bupropion and varenicline together.  What about e-cigarettes? Sometimes people wonder if using electronic cigarettes, or "e-cigarettes," might help them quit smoking. Using e-cigarettes is also called "vaping." Doctors do not recommend e-cigarettes in place of medicines and counseling. That's because e-cigarettes still contain nicotine as well as other substances that might be harmful. It's not clear how they can affect a person's health in the long term.  Will I gain weight if I quit? Yes, you might gain a few pounds. But quitting smoking will have a much more positive effect on your health than weighing a few pounds more. Plus, you can help prevent some weight gain by being more active and eating less. Taking the medicine bupropion might help control weight gain.   What else can I do to improve my chances of quitting? You can: ?Start exercising. ?Stay away from smokers and places that you associate with smoking. If people close to you smoke, ask them to quit with you. ?Keep gum, hard candy, or something to put in your mouth handy. If you get a craving for a cigarette, try one of these instead. ?Don't give up, even if you start smoking again. It takes most people a few  tries before they succeed.  What if I am pregnant and I smoke? If you are pregnant, it's really important for the health of your baby that you quit. Ask your doctor what options you have, and what is safest for your baby    Understanding COPD   What is COPD? COPD stands for chronic obstructive pulmonary (lung) disease. COPD is a general term used for several lung diseases.  COPD is an umbrella term and encompasses other  common diseases in this group like chronic bronchitis and emphysema. Chronic asthma may also be included in this group. While some patients with COPD have only chronic bronchitis or emphysema, most patients have a combination of both.  You might hear these terms used in exchange for one another.   COPD adds to the work of the heart. Diseased lungs may reduce the amount of oxygen that goes to the blood. High blood pressure in blood vessels from the heart to the lungs makes it difficult for the heart to pump. Lung disease can also cause the body to produce too many red blood cells which may make the blood thicker and harder to pump.   Patients who have COPD with low oxygen levels  may develop an enlarged heart (cor pulmonale). This condition weakens the heart and causes increased shortness of breath and swelling in the legs and feet.   Chronic bronchitis Chronic bronchitis is irritation and inflammation (swelling) of the lining in the bronchial tubes (air passages). The irritation causes coughing and an excess amount of mucus in the airways. The swelling makes it difficult to get air in and out of the lungs. The small, hair-like structures on the inside of the airways (called cilia) may be damaged by the irritation. The cilia are then unable to help clean mucus from the airways.  Bronchitis is generally considered to be chronic when you have: a productive cough (cough up mucus) and shortness of breath that lasts about 3 months or more each year for 2 or more years in a row. Your  doctor may define chronic bronchitis differently.   Emphysema Emphysema is the destruction, or breakdown, of the walls of the alveoli (air sacs) located at the end of the bronchial tubes. The damaged alveoli are not able to exchange oxygen and carbon dioxide between the lungs and the blood. The bronchioles lose their elasticity and collapse when you exhale, trapping air in the lungs. The trapped air keeps fresh air and oxygen from entering the lungs.   Who is affected by COPD? Emphysema and chronic bronchitis affect approximately 16 million people in the [place], or close to 11 percent of the population.   Symptoms of COPD   Shortness of breath   Shortness of breath with mild exercise (walking, using the stairs, etc.)   Chronic, productive cough (with mucus)   A feeling of "tightness" in the chest   Wheezing   What causes COPD? The two primary causes of COPD are cigarette smoking and alpha1-antitrypsin (AAT) deficiency. Air pollution and occupational dusts may also contribute to COPD, especially when the person exposed to these substances is a cigarette smoker.  Cigarette smoke causes COPD by irritating the airways and creating inflammation that narrows the airways, making it more difficult to breathe. Cigarette smoke also causes the cilia to stop working properly so mucus and trapped particles are not cleaned from the airways. As a result, chronic cough and excess mucus production develop, leading to chronic bronchitis.  In some people, chronic bronchitis and infections can lead to destruction of the small airways, or emphysema.  AAT deficiency, an inherited disorder, can also lead to emphysema. Alpha antitrypsin (AAT) is a protective material produced in the liver and transported to the lungs to help combat inflammation. When there is not enough of the chemical AAT, the body is no longer protected from an enzyme in the white blood cells.   How is COPD diagnosed?  To diagnose COPD,  the physician needs to know: . Do you smoke?  . Have you had chronic exposure to dust or air pollutants?  . Do other members of your family have lung disease?  Marland Kitchen Are you short of breath?  . Do you get short of breath with exercise?  Marland Kitchen Do you have chronic cough and/or wheezing?  Marland Kitchen Do you cough up excess mucus?  To help with the diagnosis, the physician will conduct a thorough physical exam which includes:  1. Listening to your lungs and heart  2. Checking your blood pressure and pulse  3. Examining your nose and throat  4. Checking your feet and ankles for swelling   Laboratory and other tests Several laboratory and other tests are needed to confirm a diagnosis of  COPD. These tests may include:  . Chest X-ray to look for lung changes that could be caused by COPD  .  Spirometry and pulmonary function tests (PFTs) to determine lung volume and air flow  . Pulse oximetry to measure the saturation of oxygen in the blood  . Arterial blood gases (ABGs) to determine the amount of oxygen and carbon dioxide in the blood  . Exercise testing to determine if the oxygen level in the blood drops during exercise   Treatment In the beginning stages of COPD, there is minimal shortness of breath that may be noticed only during exercise. As the disease progresses, shortness of breath may worsen and you may need to wear an oxygen device.   To help control other symptoms of COPD, the following treatments and lifestyle changes may be prescribed.  . Quitting smoking  . Avoiding cigarette smoke and other irritants  . Taking medications including: a. bronchodilators b. anti-inflammatory agents c. oxygen d. antibiotics  . Maintaining a healthy diet  . Following a structured exercise program such as pulmonary rehabilitation . Preventing respiratory infections  . Controlling stress   If your COPD progresses, you may be eligible to be evaluated for lung volume reduction surgery or lung transplantation. You  may also be eligible to participate in certain clinical trials (research studies). Ask your health care providers about studies being conducted in your hospital.   What is the outlook? Although COPD can not be cured, its symptoms can be treated and your quality of life can be improved. Your prognosis or outlook for the future will depend on how well your lungs are functioning, your symptoms, and how well you respond to and follow your treatment plan.

## 2019-10-08 ENCOUNTER — Other Ambulatory Visit: Payer: Self-pay | Admitting: Legal Medicine

## 2019-10-08 ENCOUNTER — Other Ambulatory Visit: Payer: Self-pay | Admitting: Family Medicine

## 2019-10-08 ENCOUNTER — Telehealth: Payer: Self-pay | Admitting: Gastroenterology

## 2019-10-08 DIAGNOSIS — Z0000 Encounter for general adult medical examination without abnormal findings: Secondary | ICD-10-CM | POA: Diagnosis not present

## 2019-10-08 DIAGNOSIS — R296 Repeated falls: Secondary | ICD-10-CM | POA: Diagnosis not present

## 2019-10-08 DIAGNOSIS — J449 Chronic obstructive pulmonary disease, unspecified: Secondary | ICD-10-CM | POA: Diagnosis not present

## 2019-10-08 DIAGNOSIS — R2681 Unsteadiness on feet: Secondary | ICD-10-CM | POA: Diagnosis not present

## 2019-10-08 NOTE — Telephone Encounter (Signed)
I have called the patient and given her the number to reschedule her Ct Scan.

## 2019-10-08 NOTE — Telephone Encounter (Signed)
Your pt

## 2019-10-13 ENCOUNTER — Other Ambulatory Visit: Payer: Self-pay | Admitting: *Deleted

## 2019-10-13 DIAGNOSIS — F17210 Nicotine dependence, cigarettes, uncomplicated: Secondary | ICD-10-CM

## 2019-10-13 DIAGNOSIS — Z87891 Personal history of nicotine dependence: Secondary | ICD-10-CM

## 2019-10-16 ENCOUNTER — Telehealth: Payer: Self-pay | Admitting: Gastroenterology

## 2019-10-16 NOTE — Telephone Encounter (Signed)
Hi Dr. Lyndel Safe, this pt just cancelled her procedure that was scheduled with you on 5/24 because her granddaughter, who was going to be her care partner, was not able to get the day off from work. She will call back to reschedule. Thank you.

## 2019-10-16 NOTE — Telephone Encounter (Signed)
Thx for letting me know RG 

## 2019-10-17 DIAGNOSIS — M5134 Other intervertebral disc degeneration, thoracic region: Secondary | ICD-10-CM | POA: Diagnosis not present

## 2019-10-17 DIAGNOSIS — M5030 Other cervical disc degeneration, unspecified cervical region: Secondary | ICD-10-CM | POA: Diagnosis not present

## 2019-10-17 DIAGNOSIS — M545 Low back pain: Secondary | ICD-10-CM | POA: Diagnosis not present

## 2019-10-17 DIAGNOSIS — M5136 Other intervertebral disc degeneration, lumbar region: Secondary | ICD-10-CM | POA: Diagnosis not present

## 2019-10-17 DIAGNOSIS — G894 Chronic pain syndrome: Secondary | ICD-10-CM | POA: Diagnosis not present

## 2019-10-20 ENCOUNTER — Encounter: Payer: Medicare Other | Admitting: Gastroenterology

## 2019-11-06 ENCOUNTER — Encounter: Payer: Self-pay | Admitting: Cardiology

## 2019-11-06 ENCOUNTER — Ambulatory Visit (INDEPENDENT_AMBULATORY_CARE_PROVIDER_SITE_OTHER): Payer: Medicare Other

## 2019-11-06 ENCOUNTER — Other Ambulatory Visit: Payer: Self-pay

## 2019-11-06 ENCOUNTER — Ambulatory Visit (INDEPENDENT_AMBULATORY_CARE_PROVIDER_SITE_OTHER): Payer: Medicare Other | Admitting: Cardiology

## 2019-11-06 VITALS — BP 160/88 | HR 70 | Ht 62.0 in | Wt 170.0 lb

## 2019-11-06 DIAGNOSIS — R002 Palpitations: Secondary | ICD-10-CM | POA: Diagnosis not present

## 2019-11-06 DIAGNOSIS — R079 Chest pain, unspecified: Secondary | ICD-10-CM

## 2019-11-06 DIAGNOSIS — I10 Essential (primary) hypertension: Secondary | ICD-10-CM | POA: Diagnosis not present

## 2019-11-06 DIAGNOSIS — E669 Obesity, unspecified: Secondary | ICD-10-CM

## 2019-11-06 DIAGNOSIS — R6889 Other general symptoms and signs: Secondary | ICD-10-CM | POA: Diagnosis not present

## 2019-11-06 DIAGNOSIS — Z720 Tobacco use: Secondary | ICD-10-CM

## 2019-11-06 DIAGNOSIS — E1169 Type 2 diabetes mellitus with other specified complication: Secondary | ICD-10-CM

## 2019-11-06 MED ORDER — DILTIAZEM HCL ER COATED BEADS 180 MG PO CP24
180.0000 mg | ORAL_CAPSULE | Freq: Every day | ORAL | 1 refills | Status: DC
Start: 2019-11-06 — End: 2020-02-03

## 2019-11-06 NOTE — Patient Instructions (Signed)
Medication Instructions:  Your physician has recommended you make the following change in your medication: 4  STOP: Amlodipine   STOP: Metoprolol   START: Cardizem 180 mg daily   *If you need a refill on your cardiac medications before your next appointment, please call your pharmacy*   Lab Work: None.  If you have labs (blood work) drawn today and your tests are completely normal, you will receive your results only by: Marland Kitchen MyChart Message (if you have MyChart) OR . A paper copy in the mail If you have any lab test that is abnormal or we need to change your treatment, we will call you to review the results.   Testing/Procedures: A zio monitor was ordered today. It will remain on for 7 days. You will then return monitor and event diary in provided box. It takes 1-2 weeks for report to be downloaded and returned to [place]. We will call you with the results. If monitor falls off or has orange flashing light, please call Zio for further instructions.      Follow-Up: At [facility], you and your health needs are our priority.  As part of our continuing mission to provide you with exceptional heart care, we have created designated Provider Care Teams.  These Care Teams include your primary Cardiologist (physician) and Advanced Practice Providers (APPs -  Physician Assistants and Nurse Practitioners) who all work together to provide you with the care you need, when you need it.  We recommend signing up for the patient portal called "MyChart".  Sign up information is provided on this After Visit Summary.  MyChart is used to connect with patients for Virtual Visits (Telemedicine).  Patients are able to view lab/test results, encounter notes, upcoming appointments, etc.  Non-urgent messages can be sent to your provider as well.   To learn more about what you can do with MyChart, go to [email or web address].    Your next appointment:   1 month(s)  The format for your next appointment:   In  Person  Provider:   Berniece Salines, DO   Other Instructions  Diltiazem Oral Tablets What is this medicine? DILTIAZEM (dil TYE a zem) is a calcium channel blocker. It relaxes your blood vessels and decreases the amount of work the heart has to do. It treats and/or prevents chest pain (also called angina). This medicine may be used for other purposes; ask your health care provider or pharmacist if you have questions. COMMON BRAND NAME(S): Cardizem What should I tell my health care provider before I take this medicine? They need to know if you have any of these conditions:  heart attack  heart disease  irregular heartbeat or rhythm  low blood pressure  an unusual or allergic reaction to diltiazem, other drugs, foods, dyes, or preservatives  pregnant or trying to get pregnant  breast-feeding How should I use this medicine? Take this drug by mouth. Take it as directed on the prescription label at the same time every day. Keep taking it unless your health care provider tells you to stop. Talk to your health care provider about the use of this drug in children. Special care may be needed. Overdosage: If you think you have taken too much of this medicine contact a poison control center or emergency room at once. NOTE: This medicine is only for you. Do not share this medicine with others. What if I miss a dose? If you miss a dose, take it as soon as you can. If it is almost  time for your next dose, take only that dose. Do not take double or extra doses. What may interact with this medicine? Do not take this medicine with any of the following:  cisapride  hawthorn  pimozide  ranolazine  red yeast rice This medicine may also interact with the following medications:  buspirone  carbamazepine  cimetidine  cyclosporine  digoxin  local anesthetics or general anesthetics  lovastatin  medicines for anxiety or difficulty sleeping like midazolam and triazolam  medicines  for high blood pressure or heart problems  quinidine  rifampin, rifabutin, or rifapentine This list may not describe all possible interactions. Give your health care provider a list of all the medicines, herbs, non-prescription drugs, or dietary supplements you use. Also tell them if you smoke, drink alcohol, or use illegal drugs. Some items may interact with your medicine. What should I watch for while using this medicine? Visit your health care provider for regular checks on your progress. Check your blood pressure as directed. Ask your health care provider what your blood pressure should be. Also, find out when you should contact him or her. Do not treat yourself for coughs, colds, or pain while you are using this drug without asking your health care provider for advice. Some drugs may increase your blood pressure. This drug may cause serious skin reactions. They can happen weeks to months after starting the drug. Contact your health care provider right away if you notice fevers or flu-like symptoms with a rash. The rash may be red or purple and then turn into blisters or peeling of the skin. Or, you might notice a red rash with swelling of the face, lips or lymph nodes in your neck or under your arms. You may get drowsy or dizzy. Do not drive, use machinery, or do anything that needs mental alertness until you know how this drug affects you. Do not stand up or sit up quickly, especially if you are an older patient. This reduces the risk of dizzy or fainting spells. What side effects may I notice from receiving this medicine? Side effects that you should report to your doctor or health care provider as soon as possible:  allergic reactions (skin rash, itching or hives; swelling of the face, lips, or tongue)  heart failure (trouble breathing; fast, irregular heartbeat; sudden weight gain; swelling of the ankles, feet, hands; unusually weak or tired)  heartbeat rhythm changes (trouble breathing;  chest pain; dizziness; fast, irregular heartbeat; feeling faint or lightheaded, falls)  liver injury (dark yellow or brown urine; general ill feeling or flu-like symptoms; loss of appetite, right upper belly pain; unusually weak or tired, yellowing of the eyes or skin)  low blood pressure (dizziness; feeling faint or lightheaded, falls; unusually weak or tired)  redness, blistering, peeling, or loosening of the skin, including inside the mouth Side effects that usually do not require medical attention (report to your doctor or health care provider if they continue or are bothersome):  changes in sex drive or performance  depressed mood  headache  sudden weight gain  nausea  sudden weight gain  trouble sleeping This list may not describe all possible side effects. Call your doctor for medical advice about side effects. You may report side effects to FDA at 1-800-FDA-1088. Where should I keep my medicine? Keep out of the reach of children and pets. Store at room temperature between 15 and 30 degrees C (59 and 86 degrees F). Protect from moisture. Keep the container tightly closed. Throw away  any unused drug after the expiration date. NOTE: This sheet is a summary. It may not cover all possible information. If you have questions about this medicine, talk to your doctor, pharmacist, or health care provider.  2020 [employer]vier/Gold Standard (2019-02-18 18:13:24)

## 2019-11-06 NOTE — Progress Notes (Signed)
Cardiology Office Note:    Date:  11/06/2019   ID:  [name], [name] [name] 14-Apr-1946, MRN [id number]  PCP:  [name] Anes, MD  Cardiologist:  [name] Salines, DO  Electrophysiologist:  None   Referring MD: [name] [name],*   " I have been doing well but now I am having more palpitations"  History of Present Illness:    [name] [name] is a 74 y.o. female with a hx of diabetes mellitus reports that she is not on any medication for that she is diet-controlled, hypertension, hyperlipidemia family history for coronary artery disease, current smoker.  I recommended she undergo a stress test as well as an echocardiogram due to shortness of breath and chest pain.  We also adjusted her antihypertensive medication.  In the interim she was able to get her echocardiogram as well as stress test-recall the patient will need to discuss these reports with her.  Patient tells me she has been experiencing significant heart fluttering.  She noticed that this is occurring times almost every other day and she describes it as a fast sudden onset of fast heartbeat that lasts a few seconds prior to resolution.  Nothing makes this better or worse.  She has had a few episodes of chest pain she tells me she has taken nitroglycerin and this has helped.  Nothing that warrants emergency department visit.  Past Medical History:  Diagnosis Date  . Arthritis   . Asthma   . Bowel trouble   . Chronic left shoulder pain 09/27/2015  . Chronic pain in female pelvis   . Colon disorder   . COPD (chronic obstructive pulmonary disease) ([place])   . Depression   . Diabetes mellitus without complication ([place])   . Dyspnea on exertion   . Enlarged aorta ([place])   . Esophageal dysphagia   . Eye problem   . Fibromyalgia   . Frequent headaches   . GERD (gastroesophageal reflux disease)   . Heart disease   . History of colon polyps   . History of kidney stones   . Hyperlipidemia   . Hypertension   . IBS (irritable bowel  syndrome)   . Insomnia   . Joint pain   . Lupus ([place]) 12/2017  . Myocardial infarction ([place])    mild-heart attack  pt.living in [place] at the time  . Osteoarthritis   . Pain of right hip joint 11/22/2015  . Primary osteoarthritis of left knee 02/04/2015  . Rash   . S/P total knee arthroplasty 02/15/2015  . Sleep trouble   . SOB (shortness of breath)   . Weakness     Past Surgical History:  Procedure Laterality Date  . ABDOMINAL HYSTERECTOMY    . BREAST BIOPSY Right   . CHOLECYSTECTOMY    . COLONOSCOPY  09/04/2011   Colonic polyps, status post polyectomy. Incidental small ascending colon lipoma  . ESOPHAGOGASTRODUODENOSCOPY  09/08/2015   Schatzki ring status post esophageal dilitation. Small hiatal hernia  . hemorrhoid surgery    . KNEE SURGERY     left  . TOTAL KNEE ARTHROPLASTY Left 02/15/2015   Procedure: TOTAL KNEE ARTHROPLASTY;  Surgeon: [name] Huger, MD;  Location: [facility];  Service: Orthopedics;  Laterality: Left;  . TUBAL LIGATION    . WRIST SURGERY     right    Current Medications: Current Meds  Medication Sig  . albuterol (PROVENTIL) (2.5 MG/3ML) 0.083% nebulizer solution Take 3 mLs (2.5 mg total) by nebulization every 6 (six) hours as needed for  wheezing or shortness of breath.  Marland Kitchen albuterol (VENTOLIN HFA) 108 (90 Base) MCG/ACT inhaler Inhale 2 puffs into the lungs every 6 (six) hours as needed for wheezing or shortness of breath.  Marland Kitchen atorvastatin (LIPITOR) 40 MG tablet daily.   . Budeson-Glycopyrrol-Formoterol 160-9-4.8 MCG/ACT AERO Inhale 2 puffs into the lungs in the morning and at bedtime.  Marland Kitchen ibuprofen (ADVIL) 600 MG tablet Take 600 mg by mouth as needed.   . nitroGLYCERIN (NITROSTAT) 0.4 MG SL tablet Place 1 tablet (0.4 mg total) under the tongue every 5 (five) minutes as needed for chest pain.  Marland Kitchen omeprazole (PRILOSEC) 40 MG capsule TAKE 1 CAPSULE(40 MG) BY MOUTH DAILY AS NEEDED  . oxyCODONE-acetaminophen (PERCOCET) 10-325 MG tablet Take 1 tablet by mouth every 6  (six) hours as needed.  . promethazine (PHENERGAN) 25 MG tablet Take 1 tablet (25 mg total) by mouth every 6 (six) hours as needed for nausea or vomiting.  . sertraline (ZOLOFT) 50 MG tablet Take 50 mg by mouth daily.  . traZODone (DESYREL) 100 MG tablet TAKE 2 TABLETS(200 MG) BY MOUTH AT BEDTIME  . zolpidem (AMBIEN) 5 MG tablet Take 1 tablet (5 mg total) by mouth at bedtime as needed.  . [DISCONTINUED] amLODipine (NORVASC) 5 MG tablet Take 1 tablet (5 mg total) by mouth daily.  . [DISCONTINUED] metoprolol tartrate (LOPRESSOR) 25 MG tablet Take 1 tablet (25 mg total) by mouth 2 (two) times daily.     Allergies:   Meperidine and Influenza vaccines   Social History   Socioeconomic History  . Marital status: Widowed    Spouse name: Not on file  . Number of children: 4  . Years of education: Not on file  . Highest education level: Not on file  Occupational History  . Occupation: Retired  Tobacco Use  . Smoking status: Current Every Day Smoker    Packs/day: 2.00    Years: 47.00    Pack years: 94.00    Types: Cigarettes  . Smokeless tobacco: Never Used  . Tobacco comment: down to 1 pack a day  Vaping Use  . Vaping Use: Never used  Substance and Sexual Activity  . Alcohol use: No  . Drug use: No  . Sexual activity: Not Currently  Other Topics Concern  . Not on file  Social History Narrative  . Not on file   Social Determinants of Health   Financial Resource Strain:   . Difficulty of Paying Living Expenses:   Food Insecurity:   . Worried About Charity fundraiser in the Last Year:   . Arboriculturist in the Last Year:   Transportation Needs:   . Film/video editor (Medical):   Marland Kitchen Lack of Transportation (Non-Medical):   Physical Activity:   . Days of Exercise per Week:   . Minutes of Exercise per Session:   Stress:   . Feeling of Stress :   Social Connections:   . Frequency of Communication with Friends and Family:   . Frequency of Social Gatherings with Friends and  Family:   . Attends Religious Services:   . Active Member of Clubs or Organizations:   . Attends Archivist Meetings:   Marland Kitchen Marital Status:      Family History: The patient's family history includes COPD in her son; Cancer in her father; Diabetes in her father; Heart disease in her father and mother; Lupus in her sister; Seizures in her son; Stroke in her father.  ROS:   Review  of Systems  Constitution: Negative for decreased appetite, fever and weight gain.  HENT: Negative for congestion, ear discharge, hoarse voice and sore throat.   Eyes: Negative for discharge, redness, vision loss in right eye and visual halos.  Cardiovascular: Negative for chest pain, dyspnea on exertion, leg swelling, orthopnea and palpitations.  Respiratory: Negative for cough, hemoptysis, shortness of breath and snoring.   Endocrine: Negative for heat intolerance and polyphagia.  Hematologic/Lymphatic: Negative for bleeding problem. Does not bruise/bleed easily.  Skin: Negative for flushing, nail changes, rash and suspicious lesions.  Musculoskeletal: Negative for arthritis, joint pain, muscle cramps, myalgias, neck pain and stiffness.  Gastrointestinal: Negative for abdominal pain, bowel incontinence, diarrhea and excessive appetite.  Genitourinary: Negative for decreased libido, genital sores and incomplete emptying.  Neurological: Negative for brief paralysis, focal weakness, headaches and loss of balance.  Psychiatric/Behavioral: Negative for altered mental status, depression and suicidal ideas.  Allergic/Immunologic: Negative for HIV exposure and persistent infections.    EKGs/Labs/Other Studies Reviewed:    The following studies were reviewed today:   EKG: None today  TTe IMPRESSIONS 09/02/19 1. Left ventricular ejection fraction, by estimation, is 60 to 65%. The  left ventricle has normal function. The left ventricle has no regional  wall motion abnormalities. Left ventricular diastolic  parameters are  consistent with Grade II diastolic  dysfunction (pseudonormalization).  2. Right ventricular systolic function is normal. The right ventricular  size is normal. There is mildly elevated pulmonary artery systolic  pressure.  3. Left atrial size was mildly dilated.  4. The mitral valve is normal in structure. Mild mitral valve  regurgitation. No evidence of mitral stenosis.  5. The aortic valve is normal in structure. Aortic valve regurgitation is  not visualized. Mild to moderate aortic valve sclerosis/calcification is  present, without any evidence of aortic stenosis.  6. There is mild dilatation of the ascending aorta measuring 39 mm.  7. The inferior vena cava is normal in size with greater than 50%  respiratory variability, suggesting right atrial pressure of 3 mmHg.   Pharmacologic stress test  Nuclear stress EF: 68%.  The left ventricular ejection fraction is hyperdynamic (>65%).  There was no ST segment deviation noted during stress.  Defect 1: There is a medium defect of moderate severity present in the basal inferolateral and apical lateral location.  This is a low risk study.  No evidence of ischemia or MI.  Normal EF.  Attenuation noted in infero-lateral wall.    Recent Labs: 08/11/2019: ALT 9; BUN 15; Creatinine, Ser 0.85; Hemoglobin 14.1; Platelets 179; Potassium 4.1; Sodium 140  Recent Lipid Panel    Component Value Date/Time   CHOL 160 08/11/2019 1044   TRIG 74 08/11/2019 1044   HDL 65 08/11/2019 1044   CHOLHDL 2.5 08/11/2019 1044   LDLCALC 81 08/11/2019 1044    Physical Exam:    VS:  BP (!) 160/88   Pulse 70   Ht 5\' 2"  (1.575 m)   Wt 170 lb (77.1 kg)   SpO2 98%   BMI 31.09 kg/m     Wt Readings from Last 3 Encounters:  11/06/19 170 lb (77.1 kg)  10/07/19 164 lb 6.4 oz (74.6 kg)  09/25/19 175 lb 4 oz (79.5 kg)     GEN: Well nourished, well developed in no acute distress HEENT: Normal NECK: No JVD; No carotid  bruits LYMPHATICS: No lymphadenopathy CARDIAC: S1S2 noted,RRR, no murmurs, rubs, gallops RESPIRATORY:  Clear to auscultation without rales, wheezing or rhonchi  ABDOMEN: Soft,  non-tender, non-distended, +bowel sounds, no guarding. EXTREMITIES: No edema, No cyanosis, no clubbing MUSCULOSKELETAL:  No deformity  SKIN: Warm and dry NEUROLOGIC:  Alert and oriented x 3, non-focal PSYCHIATRIC:  Normal affect, good insight  ASSESSMENT:    1. Palpitations   2. Essential hypertension   3. Type 2 diabetes mellitus with other specified complication, without long-term current use of insulin (HCC)   4. Chest pain, unspecified type   5. Obesity (BMI 30-39.9)   6. Tobacco use    PLAN:     1.  I would like to rule out a cardiovascular etiology of this palpitation, therefore at this time I would like to placed a zio patch for   7  days.   2.  She is still experiencing intermittent chest pain.  Of asked her to use the nitroglycerin as well as well to the emergency department at the chest pain persists.  Her recent stress test was no evidence of ischemia or infarction.  We will continue to monitor the patient in assessment for any reevaluation other testing modalities.  3.  Hypertension-she still is hypertensive.  Heart palpitations Procedures hypertension going to place the patient on Cardizem 180 mg daily.  At the same time I will stop the amlodipine as well as the Lopressor.  4.  Hyperlipidemia-continue Lipitor 40 mg daily.  5.  Tobacco use-the patient was counseled on tobacco cessation today for 5 minutes.  Counseling included reviewing the risks of smoking tobacco products, how it impacts the patient's current medical diagnoses and different strategies for quitting.  Pharmacotherapy to aid in tobacco cessation was not prescribed today. The patient coordinate with  primary care provider.  The patient was also advised to call   1-800-QUIT-NOW (724)687-8975) for additional help with quitting  smoking.   The patient is in agreement with the above plan. The patient left the office in stable condition.  The patient will follow up in 1 month or sooner if needed.    Medication Adjustments/Labs and Tests Ordered: Current medicines are reviewed at length with the patient today.  Concerns regarding medicines are outlined above.  Orders Placed This Encounter  Procedures  . LONG TERM MONITOR (3-14 DAYS)   Meds ordered this encounter  Medications  . diltiazem (CARDIZEM CD) 180 MG 24 hr capsule    Sig: Take 1 capsule (180 mg total) by mouth daily.    Dispense:  90 capsule    Refill:  1    Patient Instructions  Medication Instructions:  Your physician has recommended you make the following change in your medication: 4  STOP: Amlodipine   STOP: Metoprolol   START: Cardizem 180 mg daily   *If you need a refill on your cardiac medications before your next appointment, please call your pharmacy*   Lab Work: None.  If you have labs (blood work) drawn today and your tests are completely normal, you will receive your results only by: Marland Kitchen MyChart Message (if you have MyChart) OR . A paper copy in the mail If you have any lab test that is abnormal or we need to change your treatment, we will call you to review the results.   Testing/Procedures: A zio monitor was ordered today. It will remain on for 7 days. You will then return monitor and event diary in provided box. It takes 1-2 weeks for report to be downloaded and returned to [place]. We will call you with the results. If monitor falls off or has orange flashing light, please call Zio  for further instructions.      Follow-Up: At [facility], you and your health needs are our priority.  As part of our continuing mission to provide you with exceptional heart care, we have created designated Provider Care Teams.  These Care Teams include your primary Cardiologist (physician) and Advanced Practice Providers (APPs -  Physician  Assistants and Nurse Practitioners) who all work together to provide you with the care you need, when you need it.  We recommend signing up for the patient portal called "MyChart".  Sign up information is provided on this After Visit Summary.  MyChart is used to connect with patients for Virtual Visits (Telemedicine).  Patients are able to view lab/test results, encounter notes, upcoming appointments, etc.  Non-urgent messages can be sent to your provider as well.   To learn more about what you can do with MyChart, go to [email or web address].    Your next appointment:   1 month(s)  The format for your next appointment:   In Person  Provider:   Berniece Salines, DO   Other Instructions  Diltiazem Oral Tablets What is this medicine? DILTIAZEM (dil TYE a zem) is a calcium channel blocker. It relaxes your blood vessels and decreases the amount of work the heart has to do. It treats and/or prevents chest pain (also called angina). This medicine may be used for other purposes; ask your health care provider or pharmacist if you have questions. COMMON BRAND NAME(S): Cardizem What should I tell my health care provider before I take this medicine? They need to know if you have any of these conditions:  heart attack  heart disease  irregular heartbeat or rhythm  low blood pressure  an unusual or allergic reaction to diltiazem, other drugs, foods, dyes, or preservatives  pregnant or trying to get pregnant  breast-feeding How should I use this medicine? Take this drug by mouth. Take it as directed on the prescription label at the same time every day. Keep taking it unless your health care provider tells you to stop. Talk to your health care provider about the use of this drug in children. Special care may be needed. Overdosage: If you think you have taken too much of this medicine contact a poison control center or emergency room at once. NOTE: This medicine is only for you. Do not share  this medicine with others. What if I miss a dose? If you miss a dose, take it as soon as you can. If it is almost time for your next dose, take only that dose. Do not take double or extra doses. What may interact with this medicine? Do not take this medicine with any of the following:  cisapride  hawthorn  pimozide  ranolazine  red yeast rice This medicine may also interact with the following medications:  buspirone  carbamazepine  cimetidine  cyclosporine  digoxin  local anesthetics or general anesthetics  lovastatin  medicines for anxiety or difficulty sleeping like midazolam and triazolam  medicines for high blood pressure or heart problems  quinidine  rifampin, rifabutin, or rifapentine This list may not describe all possible interactions. Give your health care provider a list of all the medicines, herbs, non-prescription drugs, or dietary supplements you use. Also tell them if you smoke, drink alcohol, or use illegal drugs. Some items may interact with your medicine. What should I watch for while using this medicine? Visit your health care provider for regular checks on your progress. Check your blood pressure as directed. Ask your health  care provider what your blood pressure should be. Also, find out when you should contact him or her. Do not treat yourself for coughs, colds, or pain while you are using this drug without asking your health care provider for advice. Some drugs may increase your blood pressure. This drug may cause serious skin reactions. They can happen weeks to months after starting the drug. Contact your health care provider right away if you notice fevers or flu-like symptoms with a rash. The rash may be red or purple and then turn into blisters or peeling of the skin. Or, you might notice a red rash with swelling of the face, lips or lymph nodes in your neck or under your arms. You may get drowsy or dizzy. Do not drive, use machinery, or do  anything that needs mental alertness until you know how this drug affects you. Do not stand up or sit up quickly, especially if you are an older patient. This reduces the risk of dizzy or fainting spells. What side effects may I notice from receiving this medicine? Side effects that you should report to your doctor or health care provider as soon as possible:  allergic reactions (skin rash, itching or hives; swelling of the face, lips, or tongue)  heart failure (trouble breathing; fast, irregular heartbeat; sudden weight gain; swelling of the ankles, feet, hands; unusually weak or tired)  heartbeat rhythm changes (trouble breathing; chest pain; dizziness; fast, irregular heartbeat; feeling faint or lightheaded, falls)  liver injury (dark yellow or brown urine; general ill feeling or flu-like symptoms; loss of appetite, right upper belly pain; unusually weak or tired, yellowing of the eyes or skin)  low blood pressure (dizziness; feeling faint or lightheaded, falls; unusually weak or tired)  redness, blistering, peeling, or loosening of the skin, including inside the mouth Side effects that usually do not require medical attention (report to your doctor or health care provider if they continue or are bothersome):  changes in sex drive or performance  depressed mood  headache  sudden weight gain  nausea  sudden weight gain  trouble sleeping This list may not describe all possible side effects. Call your doctor for medical advice about side effects. You may report side effects to FDA at 1-800-FDA-1088. Where should I keep my medicine? Keep out of the reach of children and pets. Store at room temperature between 15 and 30 degrees C (59 and 86 degrees F). Protect from moisture. Keep the container tightly closed. Throw away any unused drug after the expiration date. NOTE: This sheet is a summary. It may not cover all possible information. If you have questions about this medicine, talk to  your doctor, pharmacist, or health care provider.  2020 Elsevier/Gold Standard (2019-02-18 18:13:24)       Adopting a Healthy Lifestyle.  Know what a healthy weight is for you (roughly BMI <25) and aim to maintain this   Aim for 7+ servings of fruits and vegetables daily   65-80+ fluid ounces of water or unsweet tea for healthy kidneys   Limit to max 1 drink of alcohol per day; avoid smoking/tobacco   Limit animal fats in diet for cholesterol and heart health - choose grass fed whenever available   Avoid highly processed foods, and foods high in saturated/trans fats   Aim for low stress - take time to unwind and care for your mental health   Aim for 150 min of moderate intensity exercise weekly for heart health, and weights twice weekly for bone  health   Aim for 7-9 hours of sleep daily   When it comes to diets, agreement about the perfect plan isnt easy to find, even among the experts. Experts at the [employer] developed an idea known as the Healthy Eating Plate. Just imagine a plate divided into logical, healthy portions.   The emphasis is on diet quality:   Load up on vegetables and fruits - one-half of your plate: Aim for color and variety, and remember that potatoes dont count.   Go for whole grains - one-quarter of your plate: Whole wheat, barley, wheat berries, quinoa, oats, brown rice, and foods made with them. If you want pasta, go with whole wheat pasta.   Protein power - one-quarter of your plate: Fish, chicken, beans, and nuts are all healthy, versatile protein sources. Limit red meat.   The diet, however, does go beyond the plate, offering a few other suggestions.   Use healthy plant oils, such as olive, canola, soy, corn, sunflower and peanut. Check the labels, and avoid partially hydrogenated oil, which have unhealthy trans fats.   If youre thirsty, drink water. Coffee and tea are good in moderation, but skip sugary drinks and limit  milk and dairy products to one or two daily servings.   The type of carbohydrate in the diet is more important than the amount. Some sources of carbohydrates, such as vegetables, fruits, whole grains, and beans-are healthier than others.   Finally, stay active  Signed, [name] Salines, DO  11/06/2019 3:45 PM    [facility]

## 2019-11-12 ENCOUNTER — Inpatient Hospital Stay: Admission: RE | Admit: 2019-11-12 | Payer: Medicare Other | Source: Ambulatory Visit

## 2019-11-12 ENCOUNTER — Other Ambulatory Visit: Payer: Self-pay

## 2019-11-12 ENCOUNTER — Encounter: Payer: Self-pay | Admitting: Acute Care

## 2019-11-12 ENCOUNTER — Ambulatory Visit (INDEPENDENT_AMBULATORY_CARE_PROVIDER_SITE_OTHER): Payer: Medicare Other | Admitting: Acute Care

## 2019-11-12 DIAGNOSIS — F17210 Nicotine dependence, cigarettes, uncomplicated: Secondary | ICD-10-CM

## 2019-11-12 DIAGNOSIS — Z122 Encounter for screening for malignant neoplasm of respiratory organs: Secondary | ICD-10-CM

## 2019-11-12 DIAGNOSIS — Z0000 Encounter for general adult medical examination without abnormal findings: Secondary | ICD-10-CM | POA: Insufficient documentation

## 2019-11-12 NOTE — Progress Notes (Signed)
Shared Decision Making Visit Lung Cancer Screening Program [phone])   Eligibility:  Age 74 y.o.  Pack Years Smoking History Calculation 94 pack year smoking history (# packs/per year x # years smoked)  Recent History of coughing up blood  no  Unexplained weight loss? no ( >Than 15 pounds within the last 6 months )  Prior History Lung / other cancer no (Diagnosis within the last 5 years already requiring surveillance chest CT Scans).  Smoking Status Current Smoker  Former Smokers: Years since quit:NA  Quit Date: NA  Visit Components:  Discussion included one or more decision making aids. yes  Discussion included risk/benefits of screening. yes  Discussion included potential follow up diagnostic testing for abnormal scans. yes  Discussion included meaning and risk of over diagnosis. yes  Discussion included meaning and risk of False Positives. yes  Discussion included meaning of total radiation exposure. yes  Counseling Included:  Importance of adherence to annual lung cancer LDCT screening. yes  Impact of comorbidities on ability to participate in the program. yes  Ability and willingness to under diagnostic treatment. yes  Smoking Cessation Counseling:  Current Smokers:   Discussed importance of smoking cessation. yes  Information about tobacco cessation classes and interventions provided to patient. yes  Patient provided with "ticket" for LDCT Scan. yes  Symptomatic Patient. no  Counseling  Diagnosis Code: Tobacco Use Z72.0  Asymptomatic Patient yes  Counseling (Intermediate counseling: > three minutes counseling) [id number]  Former Smokers:   Discussed the importance of maintaining cigarette abstinence. yes  Diagnosis Code: Personal History of Nicotine Dependence. K93.267  Information about tobacco cessation classes and interventions provided to patient. Yes  Patient provided with "ticket" for LDCT Scan. yes  Written Order for Lung Cancer  Screening with LDCT placed in Epic. Yes (CT Chest Lung Cancer Screening Low Dose W/O CM) [id number] Z12.2-Screening of respiratory organs Z87.891-Personal history of nicotine dependence   I have spent 25 minutes of face to face time with Ms. Donath discussing the risks and benefits of lung cancer screening. We viewed a power point together that explained in detail the above noted topics. We paused at intervals to allow for questions to be asked and answered to ensure understanding.We discussed that the single most powerful action that she can take to decrease her risk of developing lung cancer is to quit smoking. We discussed whether or not she is ready to commit to setting a quit date. We discussed options for tools to aid in quitting smoking including nicotine replacement therapy, non-nicotine medications, support groups, Quit Smart classes, and behavior modification. We discussed that often times setting smaller, more achievable goals, such as eliminating 1 cigarette a day for a week and then 2 cigarettes a day for a week can be helpful in slowly decreasing the number of cigarettes smoked. This allows for a sense of accomplishment as well as providing a clinical benefit. I gave her the " Be Stronger Than Your Excuses" card with contact information for community resources, classes, free nicotine replacement therapy, and access to mobile apps, text messaging, and on-line smoking cessation help. I have also given her my card and contact information in the event she needs to contact me. We discussed the time and location of the scan, and that either Doroteo Glassman RN or I will call with the results within 24-48 hours of receiving them. I have offered her  a copy of the power point we viewed  as a resource in the event they need reinforcement of  the concepts we discussed today in the office. The patient verbalized understanding of all of  the above and had no further questions upon leaving the office. They have my  contact information in the event they have any further questions.  I spent 3 minutes counseling on smoking cessation and the health risks of continued tobacco abuse.  I explained to the patient that there has been a high incidence of coronary artery disease noted on these exams. I explained that this is a non-gated exam therefore degree or severity cannot be determined. This patient is on statin therapy. I have asked the patient to follow-up with their PCP regarding any incidental finding of coronary artery disease and management with diet or medication as their PCP  feels is clinically indicated. The patient verbalized understanding of the above and had no further questions upon completion of the visit.     Magdalen Spatz, NP 11/12/2019

## 2019-11-18 ENCOUNTER — Inpatient Hospital Stay: Admission: RE | Admit: 2019-11-18 | Payer: Medicare Other | Source: Ambulatory Visit

## 2019-12-03 DIAGNOSIS — R002 Palpitations: Secondary | ICD-10-CM | POA: Diagnosis not present

## 2019-12-08 ENCOUNTER — Ambulatory Visit: Payer: Medicare Other | Admitting: Cardiology

## 2019-12-08 ENCOUNTER — Other Ambulatory Visit: Payer: Self-pay

## 2019-12-08 ENCOUNTER — Ambulatory Visit (INDEPENDENT_AMBULATORY_CARE_PROVIDER_SITE_OTHER)
Admission: RE | Admit: 2019-12-08 | Discharge: 2019-12-08 | Disposition: A | Discharge status: Home / Self Care | Payer: Medicare Other | Source: Ambulatory Visit | Attending: Acute Care | Admitting: Acute Care

## 2019-12-08 DIAGNOSIS — R6889 Other general symptoms and signs: Secondary | ICD-10-CM | POA: Diagnosis not present

## 2019-12-08 DIAGNOSIS — Z87891 Personal history of nicotine dependence: Secondary | ICD-10-CM

## 2019-12-08 DIAGNOSIS — F17210 Nicotine dependence, cigarettes, uncomplicated: Secondary | ICD-10-CM

## 2019-12-10 ENCOUNTER — Ambulatory Visit (INDEPENDENT_AMBULATORY_CARE_PROVIDER_SITE_OTHER): Payer: Medicare Other | Admitting: Cardiology

## 2019-12-10 ENCOUNTER — Encounter: Payer: Self-pay | Admitting: Cardiology

## 2019-12-10 ENCOUNTER — Other Ambulatory Visit: Payer: Self-pay

## 2019-12-10 VITALS — BP 170/80 | HR 64 | Ht 62.0 in | Wt 176.0 lb

## 2019-12-10 DIAGNOSIS — R6889 Other general symptoms and signs: Secondary | ICD-10-CM | POA: Diagnosis not present

## 2019-12-10 DIAGNOSIS — R0602 Shortness of breath: Secondary | ICD-10-CM | POA: Diagnosis not present

## 2019-12-10 DIAGNOSIS — R400 Somnolence: Secondary | ICD-10-CM | POA: Diagnosis not present

## 2019-12-10 DIAGNOSIS — R079 Chest pain, unspecified: Secondary | ICD-10-CM | POA: Diagnosis not present

## 2019-12-10 DIAGNOSIS — I491 Atrial premature depolarization: Secondary | ICD-10-CM

## 2019-12-10 DIAGNOSIS — I471 Supraventricular tachycardia: Secondary | ICD-10-CM

## 2019-12-10 DIAGNOSIS — I10 Essential (primary) hypertension: Secondary | ICD-10-CM

## 2019-12-10 DIAGNOSIS — R072 Precordial pain: Secondary | ICD-10-CM | POA: Diagnosis not present

## 2019-12-10 DIAGNOSIS — E1100 Type 2 diabetes mellitus with hyperosmolarity without nonketotic hyperglycemic-hyperosmolar coma (NKHHC): Secondary | ICD-10-CM

## 2019-12-10 DIAGNOSIS — I4719 Other supraventricular tachycardia: Secondary | ICD-10-CM

## 2019-12-10 MED ORDER — FUROSEMIDE 40 MG PO TABS
40.0000 mg | ORAL_TABLET | Freq: Every day | ORAL | 3 refills | Status: DC
Start: 2019-12-10 — End: 2020-02-03

## 2019-12-10 MED ORDER — POTASSIUM CHLORIDE CRYS ER 20 MEQ PO TBCR
20.0000 meq | EXTENDED_RELEASE_TABLET | Freq: Every day | ORAL | 3 refills | Status: DC
Start: 2019-12-10 — End: 2020-07-23

## 2019-12-10 MED ORDER — METOPROLOL TARTRATE 100 MG PO TABS
100.0000 mg | ORAL_TABLET | Freq: Once | ORAL | 0 refills | Status: DC
Start: 2019-12-10 — End: 2020-02-03

## 2019-12-10 NOTE — Progress Notes (Signed)
Cardiology Office Note:    Date:  12/10/2019   ID:  [name] [name], [name] [name] 09-Sep-1945, MRN [id number]  PCP:  Lillard Anes, MD  Cardiologist:  Berniece Salines, DO  Electrophysiologist:  None   Referring MD: Lillard Anes,*   Chief Complaint  Patient presents with  . Follow-up    History of Present Illness:    [name] [name] is a 74 y.o. female with a hx of hypertension, diabetes, hyperlipidemia, history of coronary artery disease in family members, current smoker.  The patient is here today for follow-up visit.  She tells me that the chest pain is becoming progressively worse as well as her shortness of breath.  She describes the chest pain as left-sided substernal with no radiation is intermittent and he has been going on now frequently for the last month.  She has needed to take some nitroglycerin.  She admits to associated shortness of breath.  Although she tells me sometimes on exertion she is significantly short of breath.  She now does have some bilateral leg edema which is new and is getting worse.  Past Medical History:  Diagnosis Date  . Arthritis   . Asthma   . Bowel trouble   . Chronic left shoulder pain 09/27/2015  . Chronic pain in female pelvis   . Colon disorder   . COPD (chronic obstructive pulmonary disease) ([place])   . Depression   . Diabetes mellitus without complication ([place])   . Dyspnea on exertion   . Enlarged aorta ([place])   . Esophageal dysphagia   . Eye problem   . Fibromyalgia   . Frequent headaches   . GERD (gastroesophageal reflux disease)   . Heart disease   . History of colon polyps   . History of kidney stones   . Hyperlipidemia   . Hypertension   . IBS (irritable bowel syndrome)   . Insomnia   . Joint pain   . Lupus ([place]) 12/2017  . Myocardial infarction ([place])    mild-heart attack  pt.living in [place] at the time  . Osteoarthritis   . Pain of right hip joint 11/22/2015  . Primary osteoarthritis of left knee 02/04/2015  . Rash   .  S/P total knee arthroplasty 02/15/2015  . Sleep trouble   . SOB (shortness of breath)   . Weakness     Past Surgical History:  Procedure Laterality Date  . ABDOMINAL HYSTERECTOMY    . BREAST BIOPSY Right   . CHOLECYSTECTOMY    . COLONOSCOPY  09/04/2011   Colonic polyps, status post polyectomy. Incidental small ascending colon lipoma  . ESOPHAGOGASTRODUODENOSCOPY  09/08/2015   Schatzki ring status post esophageal dilitation. Small hiatal hernia  . hemorrhoid surgery    . KNEE SURGERY     left  . TOTAL KNEE ARTHROPLASTY Left 02/15/2015   Procedure: TOTAL KNEE ARTHROPLASTY;  Surgeon: Vickey Huger, MD;  Location: [facility];  Service: Orthopedics;  Laterality: Left;  . TUBAL LIGATION    . WRIST SURGERY     right    Current Medications: Current Meds  Medication Sig  . albuterol (PROVENTIL) (2.5 MG/3ML) 0.083% nebulizer solution Take 3 mLs (2.5 mg total) by nebulization every 6 (six) hours as needed for wheezing or shortness of breath.  Marland Kitchen albuterol (VENTOLIN HFA) 108 (90 Base) MCG/ACT inhaler Inhale 2 puffs into the lungs every 6 (six) hours as needed for wheezing or shortness of breath.  Marland Kitchen atorvastatin (LIPITOR) 40 MG tablet daily.   . Budeson-Glycopyrrol-Formoterol 160-9-4.8  MCG/ACT AERO Inhale 2 puffs into the lungs in the morning and at bedtime.  Marland Kitchen diltiazem (CARDIZEM CD) 180 MG 24 hr capsule Take 1 capsule (180 mg total) by mouth daily.  Marland Kitchen ibuprofen (ADVIL) 600 MG tablet Take 600 mg by mouth as needed.   Marland Kitchen omeprazole (PRILOSEC) 40 MG capsule TAKE 1 CAPSULE(40 MG) BY MOUTH DAILY AS NEEDED  . oxyCODONE-acetaminophen (PERCOCET) 10-325 MG tablet Take 1 tablet by mouth every 6 (six) hours as needed.  . promethazine (PHENERGAN) 25 MG tablet Take 1 tablet (25 mg total) by mouth every 6 (six) hours as needed for nausea or vomiting.  . sertraline (ZOLOFT) 50 MG tablet Take 50 mg by mouth daily.  . traZODone (DESYREL) 100 MG tablet TAKE 2 TABLETS(200 MG) BY MOUTH AT BEDTIME  . zolpidem (AMBIEN) 5  MG tablet Take 1 tablet (5 mg total) by mouth at bedtime as needed.     Allergies:   Meperidine and Influenza vaccines   Social History   Socioeconomic History  . Marital status: Widowed    Spouse name: Not on file  . Number of children: 4  . Years of education: Not on file  . Highest education level: Not on file  Occupational History  . Occupation: Retired  Tobacco Use  . Smoking status: Current Every Day Smoker    Packs/day: 2.00    Years: 47.00    Pack years: 94.00    Types: Cigarettes  . Smokeless tobacco: Never Used  . Tobacco comment: down to 1 pack a day  Vaping Use  . Vaping Use: Never used  Substance and Sexual Activity  . Alcohol use: No  . Drug use: No  . Sexual activity: Not Currently  Other Topics Concern  . Not on file  Social History Narrative  . Not on file   Social Determinants of Health   Financial Resource Strain:   . Difficulty of Paying Living Expenses:   Food Insecurity:   . Worried About Charity fundraiser in the Last Year:   . Arboriculturist in the Last Year:   Transportation Needs:   . Film/video editor (Medical):   Marland Kitchen Lack of Transportation (Non-Medical):   Physical Activity:   . Days of Exercise per Week:   . Minutes of Exercise per Session:   Stress:   . Feeling of Stress :   Social Connections:   . Frequency of Communication with Friends and Family:   . Frequency of Social Gatherings with Friends and Family:   . Attends Religious Services:   . Active Member of Clubs or Organizations:   . Attends Archivist Meetings:   Marland Kitchen Marital Status:      Family History: The patient's family history includes COPD in her son; Cancer in her father; Diabetes in her father; Heart disease in her father and mother; Lupus in her sister; Seizures in her son; Stroke in her father.  ROS:   Review of Systems  Constitution: Negative for decreased appetite, fever and weight gain.  HENT: Negative for congestion, ear discharge, hoarse  voice and sore throat.   Eyes: Negative for discharge, redness, vision loss in right eye and visual halos.  Cardiovascular: Reports chest pain, dyspnea on exertion, leg swelling.  Negative for orthopnea and palpitations.  Respiratory: Negative for cough, hemoptysis, shortness of breath and snoring.   Endocrine: Negative for heat intolerance and polyphagia.  Hematologic/Lymphatic: Negative for bleeding problem. Does not bruise/bleed easily.  Skin: Negative for flushing, nail  changes, rash and suspicious lesions.  Musculoskeletal: Negative for arthritis, joint pain, muscle cramps, myalgias, neck pain and stiffness.  Gastrointestinal: Negative for abdominal pain, bowel incontinence, diarrhea and excessive appetite.  Genitourinary: Negative for decreased libido, genital sores and incomplete emptying.  Neurological: Negative for brief paralysis, focal weakness, headaches and loss of balance.  Psychiatric/Behavioral: Negative for altered mental status, depression and suicidal ideas.  Allergic/Immunologic: Negative for HIV exposure and persistent infections.    EKGs/Labs/Other Studies Reviewed:    The following studies were reviewed today:   EKG: None today  Pharmacologic stress test  Nuclear stress EF: 68%.  The left ventricular ejection fraction is hyperdynamic (>65%).  There was no ST segment deviation noted during stress.  Defect 1: There is a medium defect of moderate severity present in the basal inferolateral and apical lateral location.  This is a low risk study.  No evidence of ischemia or MI.  Normal EF.  Attenuation noted in infero-lateral wall.   Zio monitor  The patient wore the monitor for 6 days 18 hours starting 11/06/2019. Indication: Palpitations The minimum heart rate was 47 bpm, maximum heart rate was 176 bpm, and average heart rate was 72 bpm. Predominant underlying rhythm was Sinus Rhythm.  22 Supraventricular Tachycardia runs occurred, the run with the  fastest interval lasting 7 beats with a max rate of 176 bpm, the longest lasting 18 beats with an avg rate of 122 bpm.   Premature atrial complexes  were occasional (1.1%,7818). Premature Ventricular complexes were rare (<1.0%). Ventricular Bigeminy was present.  No ventricular tachycardia, no pauses, No AV block and no atrial fibrillation present.  4 patient triggered events all associated with PACs.  Conclusion: This study is remarkable for the following:                             1. Paroxysmal supraventricular tachycardia which is likely atrial tachycardia with variable block.                             2. Occasional premature atrial complexes.  Echocardiogram IMPRESSIONS  1. Left ventricular ejection fraction, by estimation, is 60 to 65%. The  left ventricle has normal function. The left ventricle has no regional  wall motion abnormalities. Left ventricular diastolic parameters are  consistent with Grade II diastolic  dysfunction (pseudonormalization).  2. Right ventricular systolic function is normal. The right ventricular  size is normal. There is mildly elevated pulmonary artery systolic  pressure.  3. Left atrial size was mildly dilated.  4. The mitral valve is normal in structure. Mild mitral valve  regurgitation. No evidence of mitral stenosis.  5. The aortic valve is normal in structure. Aortic valve regurgitation is  not visualized. Mild to moderate aortic valve sclerosis/calcification is  present, without any evidence of aortic stenosis.  6. There is mild dilatation of the ascending aorta measuring 39 mm.  7. The inferior vena cava is normal in size with greater than 50%  respiratory variability, suggesting right atrial pressure of 3 mmHg.    Recent Labs: 08/11/2019: ALT 9; BUN 15; Creatinine, Ser 0.85; Hemoglobin 14.1; Platelets 179; Potassium 4.1; Sodium 140  Recent Lipid Panel    Component Value Date/Time   CHOL 160 08/11/2019 1044   TRIG 74  08/11/2019 1044   HDL 65 08/11/2019 1044   CHOLHDL 2.5 08/11/2019 1044   LDLCALC 81 08/11/2019 1044  Physical Exam:    VS:  BP (!) 170/80 (BP Location: Right Arm, Patient Position: Sitting, Cuff Size: Normal)   Pulse 64   Ht 5' 2"  (1.575 m)   Wt 176 lb (79.8 kg)   SpO2 97%   BMI 32.19 kg/m     Wt Readings from Last 3 Encounters:  12/10/19 176 lb (79.8 kg)  11/06/19 170 lb (77.1 kg)  10/07/19 164 lb 6.4 oz (74.6 kg)     GEN: Well nourished, well developed in no acute distress HEENT: Normal NECK: No JVD; No carotid bruits LYMPHATICS: No lymphadenopathy CARDIAC: S1S2 noted,RRR, no murmurs, rubs, gallops RESPIRATORY:  Clear to auscultation without rales, wheezing or rhonchi  ABDOMEN: Soft, non-tender, non-distended, +bowel sounds, no guarding. EXTREMITIES: No edema, No cyanosis, no clubbing MUSCULOSKELETAL:  No deformity  SKIN: Warm and dry NEUROLOGIC:  Alert and oriented x 3, non-focal PSYCHIATRIC:  Normal affect, good insight  ASSESSMENT:    1. Daytime somnolence   2. Chest pain of uncertain etiology   3. Shortness of breath   4. Essential hypertension   5. Precordial pain   6. Type 2 diabetes mellitus with hyperosmolarity without coma, without long-term current use of insulin (HCC)    PLAN:     Her chest pain is concerning, given her risk factors (family history, hypertension, hyperlipidemia, diabetes mellitus) despite her negative pharmacologic stress test her symptoms gets worse.  At this time I like to pursue an ischemic evaluation again with a different testing modality.  I have discussed with the patient about a coronary CTA she is agreeable to proceed with this testing.  She has no IV contrast dye allergy.  She has nitroglycerin which I have encouraged her to continue to take this medication if needed.  I have asked her to also if the symptoms get worse to be seen at the emergency department.  Shortness of breath-this also could be related to the above.  Given  her diastolic dysfunction and her mild valvular regurgitation I started the patient on Lasix 40 mg daily hoping this will help with potassium supplement.  Daytime somnolence-she will benefit from a sleep study.  Will order this test today.  Hypertension-holding other addition of Lasix to her medication regimen will also help with her blood pressure.  Her target is less than 130/80.  Blood work will be done today which includes BMP, mag, CBC and vitamin D levels  We discussed her monitor results, patient tells me tha her palpitation has proved significantly on the Cardizem.  We will continue her on this medication for now.  The patient is in agreement with the above plan. The patient left the office in stable condition.  The patient will follow up in 1 month or sooner if needed   Medication Adjustments/Labs and Tests Ordered: Current medicines are reviewed at length with the patient today.  Concerns regarding medicines are outlined above.  Orders Placed This Encounter  Procedures  . CT CORONARY MORPH W/CTA COR W/SCORE W/CA W/CM &/OR WO/CM  . CT CORONARY FRACTIONAL FLOW RESERVE DATA PREP  . CT CORONARY FRACTIONAL FLOW RESERVE FLUID ANALYSIS  . Basic metabolic panel  . Magnesium  . CBC with Differential/Platelet  . VITAMIN D 25 Hydroxy (Vit-D Deficiency, Fractures)  . Ambulatory referral to Sleep Studies   Meds ordered this encounter  Medications  . metoprolol tartrate (LOPRESSOR) 100 MG tablet    Sig: Take 1 tablet (100 mg total) by mouth once for 1 dose. Take 2 hours prior to your  CT if your heart rate is greater than 55    Dispense:  1 tablet    Refill:  0  . furosemide (LASIX) 40 MG tablet    Sig: Take 1 tablet (40 mg total) by mouth daily.    Dispense:  30 tablet    Refill:  3  . potassium chloride SA (KLOR-CON) 20 MEQ tablet    Sig: Take 1 tablet (20 mEq total) by mouth daily.    Dispense:  30 tablet    Refill:  3    Patient Instructions  Medication Instructions:  Your  physician has recommended you make the following change in your medication:   Take 40 mg Lasix daily.  Take KCL 20 mEq daily.   *If you need a refill on your cardiac medications before your next appointment, please call your pharmacy*   Lab Work: .Your physician recommends that you have labs done in the office today. Your test included  basic metabolic panel, complete blood count, magnesium and Vitamin D.  If you have labs (blood work) drawn today and your tests are completely normal, you will receive your results only by: Marland Kitchen MyChart Message (if you have MyChart) OR . A paper copy in the mail If you have any lab test that is abnormal or we need to change your treatment, we will call you to review the results.   Testing/Procedures: Your cardiac CT will be scheduled at:   [facility] [address] [place], [place] [address] [phone]   [facility], please arrive at the [facility] main entrance of [facility] 30 minutes prior to test start time. Proceed to the [facility] Radiology Department (first floor) to check-in and test prep.  Please follow these instructions carefully (unless otherwise directed):  On the Night Before the Test: . Be sure to Drink plenty of water. . Do not consume any caffeinated/decaffeinated beverages or chocolate 12 hours prior to your test. . Do not take any antihistamines 12 hours prior to your test.  On the Day of the Test: . Drink plenty of water. Do not drink any water within one hour of the test. . Do not eat any food 4 hours prior to the test. . You may take your regular medications prior to the test.  . Take metoprolol (Lopressor) two hours prior to test. . FEMALES- please wear underwire-free bra if available  After the Test: . Drink plenty of water. . After receiving IV contrast, you may experience a mild flushed feeling. This is normal. . On occasion, you may experience a mild rash up to 24 hours after the  test. This is not dangerous. If this occurs, you can take Benadryl 25 mg and increase your fluid intake. . If you experience trouble breathing, this can be serious. If it is severe call 911 IMMEDIATELY. If it is mild, please call our office. . If you take any of these medications: Glipizide/Metformin, Avandament, Glucavance, please do not take 48 hours after completing test unless otherwise instructed.   Once we have confirmed authorization from your insurance company, we will call you to set up a date and time for your test. Based on how quickly your insurance processes prior authorizations requests, please allow up to 4 weeks to be contacted for scheduling your Cardiac CT appointment. Be advised that routine Cardiac CT appointments could be scheduled as many as 8 weeks after your provider has ordered it.  For non-scheduling related questions, please contact the cardiac imaging  nurse navigator should you have any questions/concerns: Marchia Bond, Cardiac Imaging Nurse Navigator Burley Saver, Interim Cardiac Imaging Nurse Navigator [facility] Direct Office Dial: [phone]   For scheduling needs, including cancellations and rescheduling, please call Vivien Rota at [phone], option 3.     Follow-Up: At [facility], you and your health needs are our priority.  As part of our continuing mission to provide you with exceptional heart care, we have created designated Provider Care Teams.  These Care Teams include your primary Cardiologist (physician) and Advanced Practice Providers (APPs -  Physician Assistants and Nurse Practitioners) who all work together to provide you with the care you need, when you need it.  We recommend signing up for the patient portal called "MyChart".  Sign up information is provided on this After Visit Summary.  MyChart is used to connect with patients for Virtual Visits (Telemedicine).  Patients are able to view lab/test results, encounter notes,  upcoming appointments, etc.  Non-urgent messages can be sent to your provider as well.   To learn more about what you can do with MyChart, go to [email or web address].    Your next appointment:   1 month(s)  The format for your next appointment:   In Person  Provider:   Berniece Salines, DO   Other Instructions Potassium Chloride Extended-Release Capsules What is this medicine? POTASSIUM CHLORIDE (poe TASS i um KLOOR ide) is a potassium supplement used to prevent and to treat low potassium. Potassium is important for the heart, muscles, and nerves. Too much or too little potassium in the body can cause serious problems. This medicine may be used for other purposes; ask your health care provider or pharmacist if you have questions. COMMON BRAND NAME(S): Klor-Con, Micro-K, Micro-K Extencaps What should I tell my health care provider before I take this medicine? They need to know if you have any of these conditions:  Addison disease  dehydration  diabetes, high blood sugar  difficulty swallowing  heart disease  high levels of potassium in the blood  irregular heartbeat or rhythm  kidney disease  large areas of burned skin  stomach ulcers, other stomach or intestine problems  an unusual or allergic reaction to potassium, other medicines, foods, dyes, or preservatives  pregnant or trying to get pregnant  breast-feeding How should I use this medicine? Take this drug by mouth with a glass of water. Take it as directed on the prescription label at the same time every day. Take it with food. Do not cut, crush, chew, or suck this drug. Swallow the capsules whole. You may open the capsule and put the contents in a teaspoon of soft food, such as applesauce or pudding. Do not add to hot foods. Swallow the mixture right away. Do not chew the mixture. Drink a glass of water or juice after taking the mixture. Keep taking this medicine unless your health care provider tells you to  stop. Talk to your health care provider about the use of this drug in children. Special care may be needed. Overdosage: If you think you have taken too much of this medicine contact a poison control center or emergency room at once. NOTE: This medicine is only for you. Do not share this medicine with others. What if I miss a dose? If you miss a dose, take it as soon as you can. If it is almost time for your next dose, take only that dose. Do not take double or extra doses. What may interact with  this medicine? Do not take this medicine with any of the following medications:  certain diuretics such as spironolactone, triamterene  certain medicines for stomach problems like atropine; difenoxin and glycopyrrolate  eplerenone  sodium polystyrene sulfonate This medicine may also interact with the following medications:  certain medicines for blood pressure or heart disease like lisinopril, losartan, quinapril, valsartan  medicines that lower your chance of fighting infection such as cyclosporine, tacrolimus  NSAIDs, medicines for pain and inflammation, like ibuprofen or naproxen  other potassium supplements  salt substitutes This list may not describe all possible interactions. Give your health care provider a list of all the medicines, herbs, non-prescription drugs, or dietary supplements you use. Also tell them if you smoke, drink alcohol, or use illegal drugs. Some items may interact with your medicine. What should I watch for while using this medicine? Visit your doctor or health care professional for regular check ups. You will need lab work done regularly. You may need to be on a special diet while taking this medicine. Ask your doctor. What side effects may I notice from receiving this medicine? Side effects that you should report to your doctor or health care professional as soon as possible:  allergic reactions like skin rash, itching or hives, swelling of the face, lips, or  tongue  black, tarry stools  breathing problems  confusion  heartburn  fast, irregular heartbeat  feeling faint or lightheaded, falls  low blood pressure  numbness or tingling in hands or feet  pain when swallowing  unusually weak or tired  weakness, heaviness of legs Side effects that usually do not require medical attention (report to your doctor or health care professional if they continue or are bothersome):  diarrhea  nausea, vomiting  stomach pain This list may not describe all possible side effects. Call your doctor for medical advice about side effects. You may report side effects to FDA at 1-800-FDA-1088. Where should I keep my medicine? Keep out of the reach of children. Store at room temperature between 15 and 30 degrees C (59 and 86 degrees F ). Keep bottle closed tightly to protect this medicine from light and moisture. Throw away any unused medicine after the expiration date. NOTE: This sheet is a summary. It may not cover all possible information. If you have questions about this medicine, talk to your doctor, pharmacist, or health care provider.  2020 Elsevier/Gold Standard (2019-03-11 16:43:28) Furosemide Oral Tablets What is this medicine? FUROSEMIDE (fyoor [place] se mide) is a diuretic. It helps you make more urine and to lose salt and excess water from your body. It treats swelling from heart, kidney, or liver disease. It also treats high blood pressure. This medicine may be used for other purposes; ask your health care provider or pharmacist if you have questions. COMMON BRAND NAME(S): Active-Medicated Specimen Kit, Delone, Diuscreen, Lasix, RX Specimen Collection Kit, Specimen Collection Kit, URINX Medicated Specimen Collection What should I tell my health care provider before I take this medicine? They need to know if you have any of these conditions:  abnormal blood electrolytes  diarrhea or vomiting  gout  heart disease  kidney disease, small  amounts of urine, or difficulty passing urine  liver disease  thyroid disease  an unusual or allergic reaction to furosemide, sulfa drugs, other medicines, foods, dyes, or preservatives  pregnant or trying to get pregnant  breast-feeding How should I use this medicine? Take this drug by mouth. Take it as directed on the prescription label at the  same time every day. You can take it with or without food. If it upsets your stomach, take it with food. Keep taking it unless your health care provider tells you to stop. Talk to your health care provider about the use of this drug in children. Special care may be needed. Overdosage: If you think you have taken too much of this medicine contact a poison control center or emergency room at once. NOTE: This medicine is only for you. Do not share this medicine with others. What if I miss a dose? If you miss a dose, take it as soon as you can. If it is almost time for your next dose, take only that dose. Do not take double or extra doses. What may interact with this medicine?  aspirin and aspirin-like medicines  certain antibiotics  chloral hydrate  cisplatin  cyclosporine  digoxin  diuretics  laxatives  lithium  medicines for blood pressure  medicines that relax muscles for surgery  methotrexate  NSAIDs, medicines for pain and inflammation like ibuprofen, naproxen, or indomethacin  phenytoin  steroid medicines like prednisone or cortisone  sucralfate  thyroid hormones This list may not describe all possible interactions. Give your health care provider a list of all the medicines, herbs, non-prescription drugs, or dietary supplements you use. Also tell them if you smoke, drink alcohol, or use illegal drugs. Some items may interact with your medicine. What should I watch for while using this medicine? Visit your doctor or health care provider for regular checks on your progress. Check your blood pressure regularly. Ask your  doctor or health care provider what your blood pressure should be, and when you should contact him or her. If you are a diabetic, check your blood sugar as directed. This medicine may cause serious skin reactions. They can happen weeks to months after starting the medicine. Contact your health care provider right away if you notice fevers or flu-like symptoms with a rash. The rash may be red or purple and then turn into blisters or peeling of the skin. Or, you might notice a red rash with swelling of the face, lips or lymph nodes in your neck or under your arms. You may need to be on a special diet while taking this medicine. Check with your doctor. Also, ask how many glasses of fluid you need to drink a day. You must not get dehydrated. You may get drowsy or dizzy. Do not drive, use machinery, or do anything that needs mental alertness until you know how this drug affects you. Do not stand or sit up quickly, especially if you are an older patient. This reduces the risk of dizzy or fainting spells. Alcohol can make you more drowsy and dizzy. Avoid alcoholic drinks. This medicine can make you more sensitive to the sun. Keep out of the sun. If you cannot avoid being in the sun, wear protective clothing and use sunscreen. Do not use sun lamps or tanning beds/booths. What side effects may I notice from receiving this medicine? Side effects that you should report to your doctor or health care professional as soon as possible:  blood in urine or stools  dry mouth  fever or chills  hearing loss or ringing in the ears  irregular heartbeat  muscle pain or weakness, cramps  rash, fever, and swollen lymph nodes  redness, blistering, peeling or loosening of the skin, including inside the mouth  skin rash  stomach upset, pain, or nausea  tingling or numbness in the hands  or feet  unusually weak or tired  vomiting or diarrhea  yellowing of the eyes or skin Side effects that usually do not  require medical attention (report to your doctor or health care professional if they continue or are bothersome):  headache  loss of appetite  unusual bleeding or bruising This list may not describe all possible side effects. Call your doctor for medical advice about side effects. You may report side effects to FDA at 1-800-FDA-1088. Where should I keep my medicine? Keep out of the reach of children and pets. Store at room temperature between 20 and 25 degrees C (68 and 77 degrees F). Protect from light and moisture. Keep the container tightly closed. Throw away any unused drug after the expiration date. NOTE: This sheet is a summary. It may not cover all possible information. If you have questions about this medicine, talk to your doctor, pharmacist, or health care provider.  2020 Elsevier/Gold Standard (2018-12-31 18:01:32)  Cardiac CT Angiogram A cardiac CT angiogram is a procedure to look at the heart and the area around the heart. It may be done to help find the cause of chest pains or other symptoms of heart disease. During this procedure, a substance called contrast dye is injected into the blood vessels in the area to be checked. A large X-ray machine, called a CT scanner, then takes detailed pictures of the heart and the surrounding area. The procedure is also sometimes called a coronary CT angiogram, coronary artery scanning, or CTA. A cardiac CT angiogram allows the health care provider to see how well blood is flowing to and from the heart. The health care provider will be able to see if there are any problems, such as:  Blockage or narrowing of the coronary arteries in the heart.  Fluid around the heart.  Signs of weakness or disease in the muscles, valves, and tissues of the heart. Tell a health care provider about:  Any allergies you have. This is especially important if you have had a previous allergic reaction to contrast dye.  All medicines you are taking, including  vitamins, herbs, eye drops, creams, and over-the-counter medicines.  Any blood disorders you have.  Any surgeries you have had.  Any medical conditions you have.  Whether you are pregnant or may be pregnant.  Any anxiety disorders, chronic pain, or other conditions you have that may increase your stress or prevent you from lying still. What are the risks? Generally, this is a safe procedure. However, problems may occur, including:  Bleeding.  Infection.  Allergic reactions to medicines or dyes.  Damage to other structures or organs.  Kidney damage from the contrast dye that is used.  Increased risk of cancer from radiation exposure. This risk is low. Talk with your health care provider about: ? The risks and benefits of testing. ? How you can receive the lowest dose of radiation. What happens before the procedure?  Wear comfortable clothing and remove any jewelry, glasses, dentures, and hearing aids.  Follow instructions from your health care provider about eating and drinking. This may include: ? For 12 hours before the procedure -- avoid caffeine. This includes tea, coffee, soda, energy drinks, and diet pills. Drink plenty of water or other fluids that do not have caffeine in them. Being well hydrated can prevent complications. ? For 4-6 hours before the procedure -- stop eating and drinking. The contrast dye can cause nausea, but this is less likely if your stomach is empty.  Ask your  health care provider about changing or stopping your regular medicines. This is especially important if you are taking diabetes medicines, blood thinners, or medicines to treat problems with erections (erectile dysfunction). What happens during the procedure?   Hair on your chest may need to be removed so that small sticky patches called electrodes can be placed on your chest. These will transmit information that helps to monitor your heart during the procedure.  An IV will be inserted into  one of your veins.  You might be given a medicine to control your heart rate during the procedure. This will help to ensure that good images are obtained.  You will be asked to lie on an exam table. This table will slide in and out of the CT machine during the procedure.  Contrast dye will be injected into the IV. You might feel warm, or you may get a metallic taste in your mouth.  You will be given a medicine called nitroglycerin. This will relax or dilate the arteries in your heart.  The table that you are lying on will move into the CT machine tunnel for the scan.  The person running the machine will give you instructions while the scans are being done. You may be asked to: ? Keep your arms above your head. ? Hold your breath. ? Stay very still, even if the table is moving.  When the scanning is complete, you will be moved out of the machine.  The IV will be removed. The procedure may vary among health care providers and hospitals. What can I expect after the procedure? After your procedure, it is common to have:  A metallic taste in your mouth from the contrast dye.  A feeling of warmth.  A headache from the nitroglycerin. Follow these instructions at home:  Take over-the-counter and prescription medicines only as told by your health care provider.  If you are told, drink enough fluid to keep your urine pale yellow. This will help to flush the contrast dye out of your body.  Most people can return to their normal activities right after the procedure. Ask your health care provider what activities are safe for you.  It is up to you to get the results of your procedure. Ask your health care provider, or the department that is doing the procedure, when your results will be ready.  Keep all follow-up visits as told by your health care provider. This is important. Contact a health care provider if:  You have any symptoms of allergy to the contrast dye. These  include: ? Shortness of breath. ? Rash or hives. ? A racing heartbeat. Summary  A cardiac CT angiogram is a procedure to look at the heart and the area around the heart. It may be done to help find the cause of chest pains or other symptoms of heart disease.  During this procedure, a large X-ray machine, called a CT scanner, takes detailed pictures of the heart and the surrounding area after a contrast dye has been injected into blood vessels in the area.  Ask your health care provider about changing or stopping your regular medicines before the procedure. This is especially important if you are taking diabetes medicines, blood thinners, or medicines to treat erectile dysfunction.  If you are told, drink enough fluid to keep your urine pale yellow. This will help to flush the contrast dye out of your body. This information is not intended to replace advice given to you by your health care  provider. Make sure you discuss any questions you have with your health care provider. Document Revised: 01/08/2019 Document Reviewed: 01/08/2019 Elsevier Patient Education  [place].      Adopting a Healthy Lifestyle.  Know what a healthy weight is for you (roughly BMI <25) and aim to maintain this   Aim for 7+ servings of fruits and vegetables daily   65-80+ fluid ounces of water or unsweet tea for healthy kidneys   Limit to max 1 drink of alcohol per day; avoid smoking/tobacco   Limit animal fats in diet for cholesterol and heart health - choose grass fed whenever available   Avoid highly processed foods, and foods high in saturated/trans fats   Aim for low stress - take time to unwind and care for your mental health   Aim for 150 min of moderate intensity exercise weekly for heart health, and weights twice weekly for bone health   Aim for 7-9 hours of sleep daily   When it comes to diets, agreement about the perfect plan isnt easy to find, even among the experts. Experts at  the [employer] developed an idea known as the Healthy Eating Plate. Just imagine a plate divided into logical, healthy portions.   The emphasis is on diet quality:   Load up on vegetables and fruits - one-half of your plate: Aim for color and variety, and remember that potatoes dont count.   Go for whole grains - one-quarter of your plate: Whole wheat, barley, wheat berries, quinoa, oats, brown rice, and foods made with them. If you want pasta, go with whole wheat pasta.   Protein power - one-quarter of your plate: Fish, chicken, beans, and nuts are all healthy, versatile protein sources. Limit red meat.   The diet, however, does go beyond the plate, offering a few other suggestions.   Use healthy plant oils, such as olive, canola, soy, corn, sunflower and peanut. Check the labels, and avoid partially hydrogenated oil, which have unhealthy trans fats.   If youre thirsty, drink water. Coffee and tea are good in moderation, but skip sugary drinks and limit milk and dairy products to one or two daily servings.   The type of carbohydrate in the diet is more important than the amount. Some sources of carbohydrates, such as vegetables, fruits, whole grains, and beans-are healthier than others.   Finally, stay active  Signed, Berniece Salines, DO  12/10/2019 3:38 PM    [facility]

## 2019-12-10 NOTE — Patient Instructions (Signed)
Medication Instructions:  Your physician has recommended you make the following change in your medication:   Take 40 mg Lasix daily.  Take KCL 20 mEq daily.   *If you need a refill on your cardiac medications before your next appointment, please call your pharmacy*   Lab Work: .Your physician recommends that you have labs done in the office today. Your test included  basic metabolic panel, complete blood count, magnesium and Vitamin D.  If you have labs (blood work) drawn today and your tests are completely normal, you will receive your results only by: Marland Kitchen MyChart Message (if you have MyChart) OR . A paper copy in the mail If you have any lab test that is abnormal or we need to change your treatment, we will call you to review the results.   Testing/Procedures: Your cardiac CT will be scheduled at:   [facility] [address] [place], [place] [address] [phone]   [facility], please arrive at the [facility] main entrance of [facility] 30 minutes prior to test start time. Proceed to the [facility] Radiology Department (first floor) to check-in and test prep.  Please follow these instructions carefully (unless otherwise directed):  On the Night Before the Test: . Be sure to Drink plenty of water. . Do not consume any caffeinated/decaffeinated beverages or chocolate 12 hours prior to your test. . Do not take any antihistamines 12 hours prior to your test.  On the Day of the Test: . Drink plenty of water. Do not drink any water within one hour of the test. . Do not eat any food 4 hours prior to the test. . You may take your regular medications prior to the test.  . Take metoprolol (Lopressor) two hours prior to test. . FEMALES- please wear underwire-free bra if available  After the Test: . Drink plenty of water. . After receiving IV contrast, you may experience a mild flushed feeling. This is normal. . On occasion, you may experience a  mild rash up to 24 hours after the test. This is not dangerous. If this occurs, you can take Benadryl 25 mg and increase your fluid intake. . If you experience trouble breathing, this can be serious. If it is severe call 911 IMMEDIATELY. If it is mild, please call our office. . If you take any of these medications: Glipizide/Metformin, Avandament, Glucavance, please do not take 48 hours after completing test unless otherwise instructed.   Once we have confirmed authorization from your insurance company, we will call you to set up a date and time for your test. Based on how quickly your insurance processes prior authorizations requests, please allow up to 4 weeks to be contacted for scheduling your Cardiac CT appointment. Be advised that routine Cardiac CT appointments could be scheduled as many as 8 weeks after your provider has ordered it.  For non-scheduling related questions, please contact the cardiac imaging nurse navigator should you have any questions/concerns: Marchia Bond, Cardiac Imaging Nurse Navigator Burley Saver, Interim Cardiac Imaging Nurse [place] [facility] Direct Office Dial: [phone]   For scheduling needs, including cancellations and rescheduling, please call Vivien Rota at [phone], option [phone].     Follow-Up: At [facility], you and your health needs are our priority.  As part of our continuing mission to provide you with exceptional heart care, we have created designated Provider Care Teams.  These Care Teams include your primary Cardiologist (physician) and Advanced Practice Providers (APPs -  Physician Assistants and Nurse Practitioners) who all work together to provide you with the care you need, when you need it.  We recommend signing up for the patient portal called "MyChart".  Sign up information is provided on this After Visit Summary.  MyChart is used to connect with patients for Virtual Visits (Telemedicine).  Patients are able to view  lab/test results, encounter notes, upcoming appointments, etc.  Non-urgent messages can be sent to your provider as well.   To learn more about what you can do with MyChart, go to [email or web address].    Your next appointment:   1 month(s)  The format for your next appointment:   In Person  Provider:   Berniece Salines, DO   Other Instructions Potassium Chloride Extended-Release Capsules What is this medicine? POTASSIUM CHLORIDE (poe TASS i um KLOOR ide) is a potassium supplement used to prevent and to treat low potassium. Potassium is important for the heart, muscles, and nerves. Too much or too little potassium in the body can cause serious problems. This medicine may be used for other purposes; ask your health care provider or pharmacist if you have questions. COMMON BRAND NAME(S): Klor-Con, Micro-K, Micro-K Extencaps What should I tell my health care provider before I take this medicine? They need to know if you have any of these conditions:  Addison disease  dehydration  diabetes, high blood sugar  difficulty swallowing  heart disease  high levels of potassium in the blood  irregular heartbeat or rhythm  kidney disease  large areas of burned skin  stomach ulcers, other stomach or intestine problems  an unusual or allergic reaction to potassium, other medicines, foods, dyes, or preservatives  pregnant or trying to get pregnant  breast-feeding How should I use this medicine? Take this drug by mouth with a glass of water. Take it as directed on the prescription label at the same time every day. Take it with food. Do not cut, crush, chew, or suck this drug. Swallow the capsules whole. You may open the capsule and put the contents in a teaspoon of soft food, such as applesauce or pudding. Do not add to hot foods. Swallow the mixture right away. Do not chew the mixture. Drink a glass of water or juice after taking the mixture. Keep taking this medicine unless your health  care provider tells you to stop. Talk to your health care provider about the use of this drug in children. Special care may be needed. Overdosage: If you think you have taken too much of this medicine contact a poison control center or emergency room at once. NOTE: This medicine is only for you. Do not share this medicine with others. What if I miss a dose? If you miss a dose, take it as soon as you can. If it is almost time for your next dose, take only that dose. Do not take double or extra doses. What may interact with this medicine? Do not take this medicine with any of the following medications:  certain diuretics such as spironolactone, triamterene  certain medicines for stomach problems like atropine; difenoxin and glycopyrrolate  eplerenone  sodium polystyrene sulfonate This medicine may also interact with the following medications:  certain medicines for blood pressure or heart disease like lisinopril, losartan, quinapril, valsartan  medicines that lower your chance of fighting infection such as cyclosporine, tacrolimus  NSAIDs, medicines for pain and inflammation, like ibuprofen or naproxen  other potassium supplements  salt substitutes This list may not describe all possible interactions.  Give your health care provider a list of all the medicines, herbs, non-prescription drugs, or dietary supplements you use. Also tell them if you smoke, drink alcohol, or use illegal drugs. Some items may interact with your medicine. What should I watch for while using this medicine? Visit your doctor or health care professional for regular check ups. You will need lab work done regularly. You may need to be on a special diet while taking this medicine. Ask your doctor. What side effects may I notice from receiving this medicine? Side effects that you should report to your doctor or health care professional as soon as possible:  allergic reactions like skin rash, itching or hives,  swelling of the face, lips, or tongue  black, tarry stools  breathing problems  confusion  heartburn  fast, irregular heartbeat  feeling faint or lightheaded, falls  low blood pressure  numbness or tingling in hands or feet  pain when swallowing  unusually weak or tired  weakness, heaviness of legs Side effects that usually do not require medical attention (report to your doctor or health care professional if they continue or are bothersome):  diarrhea  nausea, vomiting  stomach pain This list may not describe all possible side effects. Call your doctor for medical advice about side effects. You may report side effects to FDA at 1-800-FDA-1088. Where should I keep my medicine? Keep out of the reach of children. Store at room temperature between 15 and 30 degrees C (59 and 86 degrees F ). Keep bottle closed tightly to protect this medicine from light and moisture. Throw away any unused medicine after the expiration date. NOTE: This sheet is a summary. It may not cover all possible information. If you have questions about this medicine, talk to your doctor, pharmacist, or health care provider.  2020 Elsevier/Gold Standard (2019-03-11 16:43:28) Furosemide Oral Tablets What is this medicine? FUROSEMIDE (fyoor [place] se mide) is a diuretic. It helps you make more urine and to lose salt and excess water from your body. It treats swelling from heart, kidney, or liver disease. It also treats high blood pressure. This medicine may be used for other purposes; ask your health care provider or pharmacist if you have questions. COMMON BRAND NAME(S): Active-Medicated Specimen Kit, Delone, Diuscreen, Lasix, RX Specimen Collection Kit, Specimen Collection Kit, URINX Medicated Specimen Collection What should I tell my health care provider before I take this medicine? They need to know if you have any of these conditions:  abnormal blood electrolytes  diarrhea or vomiting  gout  heart  disease  kidney disease, small amounts of urine, or difficulty passing urine  liver disease  thyroid disease  an unusual or allergic reaction to furosemide, sulfa drugs, other medicines, foods, dyes, or preservatives  pregnant or trying to get pregnant  breast-feeding How should I use this medicine? Take this drug by mouth. Take it as directed on the prescription label at the same time every day. You can take it with or without food. If it upsets your stomach, take it with food. Keep taking it unless your health care provider tells you to stop. Talk to your health care provider about the use of this drug in children. Special care may be needed. Overdosage: If you think you have taken too much of this medicine contact a poison control center or emergency room at once. NOTE: This medicine is only for you. Do not share this medicine with others. What if I miss a dose? If you miss a dose,  take it as soon as you can. If it is almost time for your next dose, take only that dose. Do not take double or extra doses. What may interact with this medicine?  aspirin and aspirin-like medicines  certain antibiotics  chloral hydrate  cisplatin  cyclosporine  digoxin  diuretics  laxatives  lithium  medicines for blood pressure  medicines that relax muscles for surgery  methotrexate  NSAIDs, medicines for pain and inflammation like ibuprofen, naproxen, or indomethacin  phenytoin  steroid medicines like prednisone or cortisone  sucralfate  thyroid hormones This list may not describe all possible interactions. Give your health care provider a list of all the medicines, herbs, non-prescription drugs, or dietary supplements you use. Also tell them if you smoke, drink alcohol, or use illegal drugs. Some items may interact with your medicine. What should I watch for while using this medicine? Visit your doctor or health care provider for regular checks on your progress. Check your  blood pressure regularly. Ask your doctor or health care provider what your blood pressure should be, and when you should contact him or her. If you are a diabetic, check your blood sugar as directed. This medicine may cause serious skin reactions. They can happen weeks to months after starting the medicine. Contact your health care provider right away if you notice fevers or flu-like symptoms with a rash. The rash may be red or purple and then turn into blisters or peeling of the skin. Or, you might notice a red rash with swelling of the face, lips or lymph nodes in your neck or under your arms. You may need to be on a special diet while taking this medicine. Check with your doctor. Also, ask how many glasses of fluid you need to drink a day. You must not get dehydrated. You may get drowsy or dizzy. Do not drive, use machinery, or do anything that needs mental alertness until you know how this drug affects you. Do not stand or sit up quickly, especially if you are an older patient. This reduces the risk of dizzy or fainting spells. Alcohol can make you more drowsy and dizzy. Avoid alcoholic drinks. This medicine can make you more sensitive to the sun. Keep out of the sun. If you cannot avoid being in the sun, wear protective clothing and use sunscreen. Do not use sun lamps or tanning beds/booths. What side effects may I notice from receiving this medicine? Side effects that you should report to your doctor or health care professional as soon as possible:  blood in urine or stools  dry mouth  fever or chills  hearing loss or ringing in the ears  irregular heartbeat  muscle pain or weakness, cramps  rash, fever, and swollen lymph nodes  redness, blistering, peeling or loosening of the skin, including inside the mouth  skin rash  stomach upset, pain, or nausea  tingling or numbness in the hands or feet  unusually weak or tired  vomiting or diarrhea  yellowing of the eyes or skin Side  effects that usually do not require medical attention (report to your doctor or health care professional if they continue or are bothersome):  headache  loss of appetite  unusual bleeding or bruising This list may not describe all possible side effects. Call your doctor for medical advice about side effects. You may report side effects to FDA at 1-800-FDA-1088. Where should I keep my medicine? Keep out of the reach of children and pets. Store at room temperature  between 20 and 25 degrees C (68 and 77 degrees F). Protect from light and moisture. Keep the container tightly closed. Throw away any unused drug after the expiration date. NOTE: This sheet is a summary. It may not cover all possible information. If you have questions about this medicine, talk to your doctor, pharmacist, or health care provider.  2020 Elsevier/Gold Standard (2018-12-31 18:01:32)  Cardiac CT Angiogram A cardiac CT angiogram is a procedure to look at the heart and the area around the heart. It may be done to help find the cause of chest pains or other symptoms of heart disease. During this procedure, a substance called contrast dye is injected into the blood vessels in the area to be checked. A large X-ray machine, called a CT scanner, then takes detailed pictures of the heart and the surrounding area. The procedure is also sometimes called a coronary CT angiogram, coronary artery scanning, or CTA. A cardiac CT angiogram allows the health care provider to see how well blood is flowing to and from the heart. The health care provider will be able to see if there are any problems, such as:  Blockage or narrowing of the coronary arteries in the heart.  Fluid around the heart.  Signs of weakness or disease in the muscles, valves, and tissues of the heart. Tell a health care provider about:  Any allergies you have. This is especially important if you have had a previous allergic reaction to contrast dye.  All medicines  you are taking, including vitamins, herbs, eye drops, creams, and over-the-counter medicines.  Any blood disorders you have.  Any surgeries you have had.  Any medical conditions you have.  Whether you are pregnant or may be pregnant.  Any anxiety disorders, chronic pain, or other conditions you have that may increase your stress or prevent you from lying still. What are the risks? Generally, this is a safe procedure. However, problems may occur, including:  Bleeding.  Infection.  Allergic reactions to medicines or dyes.  Damage to other structures or organs.  Kidney damage from the contrast dye that is used.  Increased risk of cancer from radiation exposure. This risk is low. Talk with your health care provider about: ? The risks and benefits of testing. ? How you can receive the lowest dose of radiation. What happens before the procedure?  Wear comfortable clothing and remove any jewelry, glasses, dentures, and hearing aids.  Follow instructions from your health care provider about eating and drinking. This may include: ? For 12 hours before the procedure -- avoid caffeine. This includes tea, coffee, soda, energy drinks, and diet pills. Drink plenty of water or other fluids that do not have caffeine in them. Being well hydrated can prevent complications. ? For 4-6 hours before the procedure -- stop eating and drinking. The contrast dye can cause nausea, but this is less likely if your stomach is empty.  Ask your health care provider about changing or stopping your regular medicines. This is especially important if you are taking diabetes medicines, blood thinners, or medicines to treat problems with erections (erectile dysfunction). What happens during the procedure?   Hair on your chest may need to be removed so that small sticky patches called electrodes can be placed on your chest. These will transmit information that helps to monitor your heart during the procedure.  An  IV will be inserted into one of your veins.  You might be given a medicine to control your heart rate during the  procedure. This will help to ensure that good images are obtained.  You will be asked to lie on an exam table. This table will slide in and out of the CT machine during the procedure.  Contrast dye will be injected into the IV. You might feel warm, or you may get a metallic taste in your mouth.  You will be given a medicine called nitroglycerin. This will relax or dilate the arteries in your heart.  The table that you are lying on will move into the CT machine tunnel for the scan.  The person running the machine will give you instructions while the scans are being done. You may be asked to: ? Keep your arms above your head. ? Hold your breath. ? Stay very still, even if the table is moving.  When the scanning is complete, you will be moved out of the machine.  The IV will be removed. The procedure may vary among health care providers and hospitals. What can I expect after the procedure? After your procedure, it is common to have:  A metallic taste in your mouth from the contrast dye.  A feeling of warmth.  A headache from the nitroglycerin. Follow these instructions at home:  Take over-the-counter and prescription medicines only as told by your health care provider.  If you are told, drink enough fluid to keep your urine pale yellow. This will help to flush the contrast dye out of your body.  Most people can return to their normal activities right after the procedure. Ask your health care provider what activities are safe for you.  It is up to you to get the results of your procedure. Ask your health care provider, or the department that is doing the procedure, when your results will be ready.  Keep all follow-up visits as told by your health care provider. This is important. Contact a health care provider if:  You have any symptoms of allergy to the contrast dye.  These include: ? Shortness of breath. ? Rash or hives. ? A racing heartbeat. Summary  A cardiac CT angiogram is a procedure to look at the heart and the area around the heart. It may be done to help find the cause of chest pains or other symptoms of heart disease.  During this procedure, a large X-ray machine, called a CT scanner, takes detailed pictures of the heart and the surrounding area after a contrast dye has been injected into blood vessels in the area.  Ask your health care provider about changing or stopping your regular medicines before the procedure. This is especially important if you are taking diabetes medicines, blood thinners, or medicines to treat erectile dysfunction.  If you are told, drink enough fluid to keep your urine pale yellow. This will help to flush the contrast dye out of your body. This information is not intended to replace advice given to you by your health care provider. Make sure you discuss any questions you have with your health care provider. Document Revised: 01/08/2019 Document Reviewed: 01/08/2019 Elsevier Patient Education  [employer].

## 2019-12-11 ENCOUNTER — Other Ambulatory Visit: Payer: Self-pay | Admitting: *Deleted

## 2019-12-11 ENCOUNTER — Telehealth: Payer: Self-pay | Admitting: Acute Care

## 2019-12-11 ENCOUNTER — Telehealth: Payer: Self-pay

## 2019-12-11 DIAGNOSIS — Z87891 Personal history of nicotine dependence: Secondary | ICD-10-CM

## 2019-12-11 DIAGNOSIS — F17210 Nicotine dependence, cigarettes, uncomplicated: Secondary | ICD-10-CM

## 2019-12-11 LAB — CBC WITH DIFFERENTIAL/PLATELET
Basophils Absolute: 0.1 10*3/uL (ref 0.0–0.2)
Basos: 1 %
EOS (ABSOLUTE): 0 10*3/uL (ref 0.0–0.4)
Eos: 0 %
Hematocrit: 39.3 % (ref 34.0–46.6)
Hemoglobin: 13.2 g/dL (ref 11.1–15.9)
Immature Grans (Abs): 0 10*3/uL (ref 0.0–0.1)
Immature Granulocytes: 0 %
Lymphocytes Absolute: 1.7 10*3/uL (ref 0.7–3.1)
Lymphs: 25 %
MCH: 32 pg (ref 26.6–33.0)
MCHC: 33.6 g/dL (ref 31.5–35.7)
MCV: 95 fL (ref 79–97)
Monocytes Absolute: 0.5 10*3/uL (ref 0.1–0.9)
Monocytes: 7 %
Neutrophils Absolute: 4.5 10*3/uL (ref 1.4–7.0)
Neutrophils: 67 %
Platelets: 185 10*3/uL (ref 150–450)
RBC: 4.13 x10E6/uL (ref 3.77–5.28)
RDW: 13 % (ref 11.7–15.4)
WBC: 6.7 10*3/uL (ref 3.4–10.8)

## 2019-12-11 LAB — BASIC METABOLIC PANEL
BUN/Creatinine Ratio: 14 (ref 12–28)
BUN: 11 mg/dL (ref 8–27)
CO2: 26 mmol/L (ref 20–29)
Calcium: 9.4 mg/dL (ref 8.7–10.3)
Chloride: 107 mmol/L — ABNORMAL HIGH (ref 96–106)
Creatinine, Ser: 0.76 mg/dL (ref 0.57–1.00)
GFR calc Af Amer: 90 mL/min/{1.73_m2} (ref >59)
GFR calc non Af Amer: 78 mL/min/{1.73_m2} (ref >59)
Glucose: 80 mg/dL (ref 65–99)
Potassium: 4.3 mmol/L (ref 3.5–5.2)
Sodium: 147 mmol/L — ABNORMAL HIGH (ref 134–144)

## 2019-12-11 LAB — MAGNESIUM: Magnesium: 2.1 mg/dL (ref 1.6–2.3)

## 2019-12-11 LAB — VITAMIN D 25 HYDROXY (VIT D DEFICIENCY, FRACTURES): Vit D, 25-Hydroxy: 17.3 ng/mL — ABNORMAL LOW (ref 30.0–100.0)

## 2019-12-11 MED ORDER — VITAMIN D (ERGOCALCIFEROL) 1.25 MG (50000 UNIT) PO CAPS
50000.0000 [IU] | ORAL_CAPSULE | ORAL | 0 refills | Status: DC
Start: 2019-12-11 — End: 2020-09-10

## 2019-12-11 NOTE — Telephone Encounter (Signed)
Spoke with patient regarding results and recommendation.  Patient verbalizes understanding and is agreeable to plan of care. Advised patient to call back with any issues or concerns.  

## 2019-12-11 NOTE — Telephone Encounter (Signed)
Spoke with pt and she requests that I call her grandaughter Caryl Pina who is her caregiver and go over her CT results. Called and spoke with Caryl Pina and reviewed pt's CT results. She verbalized understanding. Nothing further needed.

## 2019-12-11 NOTE — Progress Notes (Signed)

## 2019-12-11 NOTE — Telephone Encounter (Signed)
-----   Message from Berniece Salines, DO sent at 12/11/2019  9:05 AM EDT ----- Your vitamin D level is significantly low at 17 lower limits of normal is 30.  So this means you have vitamin D deficiency.  I like to replete you 50,000 units weekly for 12 weeks.  Your sodium is slightly elevated I would recommend increasing hydration a little bit

## 2020-01-15 ENCOUNTER — Ambulatory Visit: Payer: Medicare Other | Admitting: Cardiology

## 2020-01-15 ENCOUNTER — Telehealth: Payer: Self-pay

## 2020-01-15 DIAGNOSIS — R400 Somnolence: Secondary | ICD-10-CM

## 2020-01-15 NOTE — Telephone Encounter (Signed)
Per Berniece Salines, DO 12/11/19 Office Note  Daytime somnolence-she will benefit from a sleep study.  Will order this test today.  Orders in and sent to precert

## 2020-01-19 ENCOUNTER — Telehealth: Payer: Self-pay

## 2020-01-19 ENCOUNTER — Telehealth: Payer: Self-pay | Admitting: *Deleted

## 2020-01-19 NOTE — Telephone Encounter (Signed)
Tried calling sleep center at the number provided and did not get an answer but did leave a voicemail requesting that they call back to schedule.

## 2020-01-19 NOTE — Telephone Encounter (Signed)
-----   Message from Lauralee Evener, [place] sent at 01/19/2020 12:55 PM EDT ----- Regarding: RE: split night Ok to schedule sleep study. Per [employer] no PA is required. ----- Message ----- From: Bobby Rumpf, CMA Sent: 01/15/2020  11:09 AM EDT To: [facility] Sleep Studies Subject: split night                                    Pt needs precert for split night. Orders in  Thanks

## 2020-01-19 NOTE — Telephone Encounter (Signed)
Staff message sent to Resa Miner, RN ok to schedule sleep study. Per [facility] web portal no PA is required. Decision [id number].

## 2020-01-20 ENCOUNTER — Telehealth: Payer: Self-pay

## 2020-01-20 NOTE — Telephone Encounter (Signed)
Tried calling patient. No answer and no voicemail set up for me to leave a message. I was calling to let her know that her sleep study has been scheduled for 02/11/20 at 8 pm and that she would be receiving the packet in the mail with the needed information for this appointment.

## 2020-01-20 NOTE — Telephone Encounter (Signed)
I tried calling the sleep center to reschedule but did not get an answer at this time. I will call back again later this afternoon or tomorrow to try to get this rescheduled for her.

## 2020-01-20 NOTE — Telephone Encounter (Signed)
[name] [name] is returning [employer]. I advised her of the sleep study appointment and she stated she will be out of town at that time and would like to reschedule for October. Please advise.

## 2020-01-20 NOTE — Telephone Encounter (Signed)
-----   Message from Lauralee Evener, [place] sent at 01/19/2020 12:55 PM EDT ----- Regarding: RE: split night Ok to schedule sleep study. Per [employer] no PA is required. ----- Message ----- From: Bobby Rumpf, CMA Sent: 01/15/2020  11:09 AM EDT To: [facility] Sleep Studies Subject: split night                                    Pt needs precert for split night. Orders in  Thanks

## 2020-01-21 ENCOUNTER — Telehealth: Payer: Self-pay

## 2020-01-21 NOTE — Telephone Encounter (Signed)
-----   Message from Lauralee Evener, [place] sent at 01/19/2020 12:55 PM EDT ----- Regarding: RE: split night Ok to schedule sleep study. Per [employer] no PA is required. ----- Message ----- From: Bobby Rumpf, CMA Sent: 01/15/2020  11:09 AM EDT To: [facility] Sleep Studies Subject: split night                                    Pt needs precert for split night. Orders in  Thanks

## 2020-01-21 NOTE — Telephone Encounter (Signed)
Tried calling patient. No answer and no voicemail set up for me to leave a message. I got her sleep study rescheduled to 03/01/20 for her. The clinic wanted to let me know that they did already send out her paperwork so she will just continue to follow up directions in the paperwork but just change the testing date.

## 2020-01-23 NOTE — Telephone Encounter (Signed)
Spoke with the patient just now and let her know about this appointment and that the information will be coming to her in the mail. She verbalizes understanding and thanks me for the call back.    Encouraged patient to call back with any questions or concerns.

## 2020-01-27 ENCOUNTER — Other Ambulatory Visit: Payer: Self-pay | Admitting: Legal Medicine

## 2020-01-27 ENCOUNTER — Ambulatory Visit: Payer: Medicare Other | Admitting: Cardiology

## 2020-02-03 ENCOUNTER — Other Ambulatory Visit: Payer: Self-pay

## 2020-02-03 ENCOUNTER — Encounter: Payer: Self-pay | Admitting: Cardiology

## 2020-02-03 ENCOUNTER — Ambulatory Visit (INDEPENDENT_AMBULATORY_CARE_PROVIDER_SITE_OTHER): Payer: Medicare Other | Admitting: Cardiology

## 2020-02-03 VITALS — BP 156/86 | HR 73 | Wt 162.2 lb

## 2020-02-03 DIAGNOSIS — I4719 Other supraventricular tachycardia: Secondary | ICD-10-CM

## 2020-02-03 DIAGNOSIS — I471 Supraventricular tachycardia: Secondary | ICD-10-CM | POA: Insufficient documentation

## 2020-02-03 DIAGNOSIS — I491 Atrial premature depolarization: Secondary | ICD-10-CM | POA: Diagnosis not present

## 2020-02-03 DIAGNOSIS — I5189 Other ill-defined heart diseases: Secondary | ICD-10-CM

## 2020-02-03 DIAGNOSIS — E1169 Type 2 diabetes mellitus with other specified complication: Secondary | ICD-10-CM | POA: Diagnosis not present

## 2020-02-03 DIAGNOSIS — I10 Essential (primary) hypertension: Secondary | ICD-10-CM | POA: Diagnosis not present

## 2020-02-03 DIAGNOSIS — Z720 Tobacco use: Secondary | ICD-10-CM

## 2020-02-03 HISTORY — DX: Other supraventricular tachycardia: I47.19

## 2020-02-03 HISTORY — DX: Supraventricular tachycardia: I47.1

## 2020-02-03 MED ORDER — LISINOPRIL 20 MG PO TABS: 20.0000 mg | ORAL_TABLET | Freq: Two times a day (BID) | ORAL | 3 refills | Status: DC

## 2020-02-03 MED ORDER — HYDROCHLOROTHIAZIDE 12.5 MG PO CAPS: 12.5000 mg | ORAL_CAPSULE | Freq: Every day | ORAL | 3 refills | Status: DC

## 2020-02-03 NOTE — Patient Instructions (Signed)
Medication Instructions:  Your physician has recommended you make the following change in your medication:  STOP: Lisinopril/HCTZ combo pill START: Lisinopril 20 mg take one tablet by mouth twice daily.  START: Hydrochlorothiazide 12.5 mg take one tablet by mouth daily.   *If you need a refill on your cardiac medications before your next appointment, please call your pharmacy*   Lab Work: Your physician recommends that you return for lab work in: [place], CBC If you have labs (blood work) drawn today and your tests are completely normal, you will receive your results only by: Marland Kitchen MyChart Message (if you have MyChart) OR . A paper copy in the mail If you have any lab test that is abnormal or we need to change your treatment, we will call you to review the results.   Testing/Procedures: None   Follow-Up: At [facility], you and your health needs are our priority.  As part of our continuing mission to provide you with exceptional heart care, we have created designated Provider Care Teams.  These Care Teams include your primary Cardiologist (physician) and Advanced Practice Providers (APPs -  Physician Assistants and Nurse Practitioners) who all work together to provide you with the care you need, when you need it.  We recommend signing up for the patient portal called "MyChart".  Sign up information is provided on this After Visit Summary.  MyChart is used to connect with patients for Virtual Visits (Telemedicine).  Patients are able to view lab/test results, encounter notes, upcoming appointments, etc.  Non-urgent messages can be sent to your provider as well.   To learn more about what you can do with MyChart, go to [email or web address].    Your next appointment:   6 month(s)  The format for your next appointment:   In Person  Provider:   Berniece Salines, DO   Other Instructions

## 2020-02-03 NOTE — Progress Notes (Signed)
Cardiology Office Note:    Date:  02/03/2020   ID:  [name] [name], DOB Apr 02, 1946, MRN [id number]  PCP:  Lillard Anes, MD  Cardiologist:  Berniece Salines, DO  Electrophysiologist:  None   Referring MD: Lillard Anes,*   Follow-up visit  History of Present Illness:    [name] [name] is a 74 y.o. female with a hx of hypertension, hyperlipidemia, tobacco use, mild aortic aneurysm 39 mm, occasional PACs on the monitor  She is here today for follow-up visit.  She tells me that she has stopped the calcium channel blocker due to leg swelling.   Past Medical History:  Diagnosis Date  . Arthritis   . Asthma   . Bowel trouble   . Chronic left shoulder pain 09/27/2015  . Chronic pain in female pelvis   . Colon disorder   . COPD (chronic obstructive pulmonary disease) ([place])   . Depression   . Diabetes mellitus without complication ([place])   . Dyspnea on exertion   . Enlarged aorta ([place])   . Esophageal dysphagia   . Eye problem   . Fibromyalgia   . Frequent headaches   . GERD (gastroesophageal reflux disease)   . Heart disease   . History of colon polyps   . History of kidney stones   . Hyperlipidemia   . Hypertension   . IBS (irritable bowel syndrome)   . Insomnia   . Joint pain   . Lupus ([place]) 12/2017  . Myocardial infarction ([place])    mild-heart attack  pt.living in [place] at the time  . Osteoarthritis   . Pain of right hip joint 11/22/2015  . Primary osteoarthritis of left knee 02/04/2015  . Rash   . S/P total knee arthroplasty 02/15/2015  . Sleep trouble   . SOB (shortness of breath)   . Weakness     Past Surgical History:  Procedure Laterality Date  . ABDOMINAL HYSTERECTOMY    . BREAST BIOPSY Right   . CHOLECYSTECTOMY    . COLONOSCOPY  09/04/2011   Colonic polyps, status post polyectomy. Incidental small ascending colon lipoma  . ESOPHAGOGASTRODUODENOSCOPY  09/08/2015   Schatzki ring status post esophageal dilitation. Small hiatal hernia  . hemorrhoid  surgery    . KNEE SURGERY     left  . TOTAL KNEE ARTHROPLASTY Left 02/15/2015   Procedure: TOTAL KNEE ARTHROPLASTY;  Surgeon: Vickey Huger, MD;  Location: [facility];  Service: Orthopedics;  Laterality: Left;  . TUBAL LIGATION    . WRIST SURGERY     right    Current Medications: Current Meds  Medication Sig  . albuterol (PROVENTIL) (2.5 MG/3ML) 0.083% nebulizer solution Take 3 mLs (2.5 mg total) by nebulization every 6 (six) hours as needed for wheezing or shortness of breath.  Marland Kitchen albuterol (VENTOLIN HFA) 108 (90 Base) MCG/ACT inhaler Inhale 2 puffs into the lungs every 6 (six) hours as needed for wheezing or shortness of breath.  Marland Kitchen atorvastatin (LIPITOR) 40 MG tablet daily.   . Budeson-Glycopyrrol-Formoterol 160-9-4.8 MCG/ACT AERO Inhale 2 puffs into the lungs in the morning and at bedtime.  Marland Kitchen ibuprofen (ADVIL) 600 MG tablet Take 600 mg by mouth as needed.   Marland Kitchen lisinopril-hydrochlorothiazide (ZESTORETIC) 20-12.5 MG tablet TAKE 2 TABLETS BY MOUTH EVERY DAY  . nitroGLYCERIN (NITROSTAT) 0.4 MG SL tablet Place 1 tablet (0.4 mg total) under the tongue every 5 (five) minutes as needed for chest pain.  Marland Kitchen omeprazole (PRILOSEC) 40 MG capsule TAKE 1 CAPSULE(40 MG) BY MOUTH DAILY  AS NEEDED  . oxyCODONE-acetaminophen (PERCOCET) 10-325 MG tablet Take 1 tablet by mouth every 6 (six) hours as needed.  . potassium chloride SA (KLOR-CON) 20 MEQ tablet Take 1 tablet (20 mEq total) by mouth daily. (Patient taking differently: Take 20 mEq by mouth daily as needed. )  . promethazine (PHENERGAN) 25 MG tablet Take 1 tablet (25 mg total) by mouth every 6 (six) hours as needed for nausea or vomiting.  . traZODone (DESYREL) 100 MG tablet TAKE 2 TABLETS(200 MG) BY MOUTH AT BEDTIME  . Vitamin D, Ergocalciferol, (DRISDOL) 1.25 MG (50000 UNIT) CAPS capsule Take 1 capsule (50,000 Units total) by mouth every 7 (seven) days.     Allergies:   Meperidine and Influenza vaccines   Social History   Socioeconomic History  .  Marital status: Widowed    Spouse name: Not on file  . Number of children: 4  . Years of education: Not on file  . Highest education level: Not on file  Occupational History  . Occupation: Retired  Tobacco Use  . Smoking status: Current Every Day Smoker    Packs/day: 2.00    Years: 47.00    Pack years: 94.00    Types: Cigarettes  . Smokeless tobacco: Never Used  . Tobacco comment: down to 1 pack a day  Vaping Use  . Vaping Use: Never used  Substance and Sexual Activity  . Alcohol use: No  . Drug use: No  . Sexual activity: Not Currently  Other Topics Concern  . Not on file  Social History Narrative  . Not on file   Social Determinants of Health   Financial Resource Strain:   . Difficulty of Paying Living Expenses: Not on file  Food Insecurity:   . Worried About Charity fundraiser in the Last Year: Not on file  . Ran Out of Food in the Last Year: Not on file  Transportation Needs:   . Lack of Transportation (Medical): Not on file  . Lack of Transportation (Non-Medical): Not on file  Physical Activity:   . Days of Exercise per Week: Not on file  . Minutes of Exercise per Session: Not on file  Stress:   . Feeling of Stress : Not on file  Social Connections:   . Frequency of Communication with Friends and Family: Not on file  . Frequency of Social Gatherings with Friends and Family: Not on file  . Attends Religious Services: Not on file  . Active Member of Clubs or Organizations: Not on file  . Attends Archivist Meetings: Not on file  . Marital Status: Not on file     Family History: The patient's family history includes COPD in her son; Cancer in her father; Diabetes in her father; Heart disease in her father and mother; Lupus in her sister; Seizures in her son; Stroke in her father.  ROS:   Review of Systems  Constitution: Negative for decreased appetite, fever and weight gain.  HENT: Negative for congestion, ear discharge, hoarse voice and sore  throat.   Eyes: Negative for discharge, redness, vision loss in right eye and visual halos.  Cardiovascular: Negative for chest pain, dyspnea on exertion, leg swelling, orthopnea and palpitations.  Respiratory: Negative for cough, hemoptysis, shortness of breath and snoring.   Endocrine: Negative for heat intolerance and polyphagia.  Hematologic/Lymphatic: Negative for bleeding problem. Does not bruise/bleed easily.  Skin: Negative for flushing, nail changes, rash and suspicious lesions.  Musculoskeletal: Negative for arthritis, joint pain, muscle cramps,  myalgias, neck pain and stiffness.  Gastrointestinal: Negative for abdominal pain, bowel incontinence, diarrhea and excessive appetite.  Genitourinary: Negative for decreased libido, genital sores and incomplete emptying.  Neurological: Negative for brief paralysis, focal weakness, headaches and loss of balance.  Psychiatric/Behavioral: Negative for altered mental status, depression and suicidal ideas.  Allergic/Immunologic: Negative for HIV exposure and persistent infections.    EKGs/Labs/Other Studies Reviewed:    The following studies were reviewed today:   EKG:  The ekg ordered today demonstrates   Pharmacologic stress test  Nuclear stress EF: 68%.  The left ventricular ejection fraction is hyperdynamic (>65%).  There was no ST segment deviation noted during stress.  Defect 1: There is a medium defect of moderate severity present in the basal inferolateral and apical lateral location.  This is a low risk study.  No evidence of ischemia or MI.  Normal EF.  Attenuation noted in infero-lateral wall.  Zio monitor  The patient wore the monitor for 6 days 18 hours starting 11/06/2019. Indication: Palpitations The minimum heart rate was 47 bpm, maximum heart rate was 176 bpm, and average heart rate was 72 bpm. Predominant underlying rhythm was Sinus Rhythm.  22 Supraventricular Tachycardia runs occurred, the run with the  fastest interval lasting 7 beats with a max rate of 176 bpm, the longest lasting 18 beats with an avg rate of 122 bpm.   Premature atrial complexes were occasional (1.1%,7818). Premature Ventricular complexes were rare (<1.0%). Ventricular Bigeminy was present.  No ventricular tachycardia, no pauses, No AV block and no atrial fibrillation present.  4 patient triggered events all associated with PACs.  Conclusion: This study is remarkable for the following: 1. Paroxysmal supraventricular tachycardia which is likely atrial tachycardia with variable block. 2. Occasional premature atrial complexes.  Echocardiogram IMPRESSIONS  1. Left ventricular ejection fraction, by estimation, is 60 to 65%. The  left ventricle has normal function. The left ventricle has no regional  wall motion abnormalities. Left ventricular diastolic parameters are  consistent with Grade II diastolic  dysfunction (pseudonormalization).  2. Right ventricular systolic function is normal. The right ventricular  size is normal. There is mildly elevated pulmonary artery systolic  pressure.  3. Left atrial size was mildly dilated.  4. The mitral valve is normal in structure. Mild mitral valve  regurgitation. No evidence of mitral stenosis.  5. The aortic valve is normal in structure. Aortic valve regurgitation is  not visualized. Mild to moderate aortic valve sclerosis/calcification is  present, without any evidence of aortic stenosis.  6. There is mild dilatation of the ascending aorta measuring 39 mm.  7. The inferior vena cava is normal in size with greater than 50%  respiratory variability, suggesting right atrial pressure of 3 mmHg.   Recent Labs: 08/11/2019: ALT 9 12/10/2019: BUN 11; Creatinine, Ser 0.76; Hemoglobin 13.2; Magnesium 2.1; Platelets 185; Potassium 4.3; Sodium 147  Recent Lipid Panel    Component Value Date/Time   CHOL 160  08/11/2019 1044   TRIG 74 08/11/2019 1044   HDL 65 08/11/2019 1044   CHOLHDL 2.5 08/11/2019 1044   LDLCALC 81 08/11/2019 1044    Physical Exam:    VS:  BP (!) 156/86   Pulse 73   Wt 162 lb 3.2 oz (73.6 kg)   SpO2 97%   BMI 29.67 kg/m     Wt Readings from Last 3 Encounters:  02/03/20 162 lb 3.2 oz (73.6 kg)  12/10/19 176 lb (79.8 kg)  11/06/19 170 lb (77.1 kg)  GEN: Well nourished, well developed in no acute distress HEENT: Normal NECK: No JVD; No carotid bruits LYMPHATICS: No lymphadenopathy CARDIAC: S1S2 noted,RRR, no murmurs, rubs, gallops RESPIRATORY:  Clear to auscultation without rales, wheezing or rhonchi  ABDOMEN: Soft, non-tender, non-distended, +bowel sounds, no guarding. EXTREMITIES: No edema, No cyanosis, no clubbing MUSCULOSKELETAL:  No deformity  SKIN: Warm and dry NEUROLOGIC:  Alert and oriented x 3, non-focal PSYCHIATRIC:  Normal affect, good insight  ASSESSMENT:    1. Essential hypertension   2. Type 2 diabetes mellitus with other specified complication, without long-term current use of insulin ([place])   3. Tobacco use   4. Diastolic dysfunction   5. PAC (premature atrial contraction)   6. PAT (paroxysmal atrial tachycardia) ([facility])    PLAN:    Her blood pressure is elevated in the office today.  We will get her home readings from her visiting nurse that also was elevated.  I like to increase her lisinopril to 20 mg twice a day.  And she will continue hydrochlorothiazide 12.5 daily.  She still has fatigue with daytime somnolence she is pending her sleep study.  In addition she has had intermittent shortness of breath her cardiac CT has been scheduled for September 15.     Type 2 diabetes Per PCP. Smoking cessation advised. Palpitation has resolved we will hold off on adding rate control agents.  She did not tolerate Cardizem.  The patient is in agreement with the above plan. The patient left the office in stable condition.  The patient will  follow up in 6 months or sooner if needed.   Medication Adjustments/Labs and Tests Ordered: Current medicines are reviewed at length with the patient today.  Concerns regarding medicines are outlined above.  No orders of the defined types were placed in this encounter.  No orders of the defined types were placed in this encounter.   There are no Patient Instructions on file for this visit.   Adopting a Healthy Lifestyle.  Know what a healthy weight is for you (roughly BMI <25) and aim to maintain this   Aim for 7+ servings of fruits and vegetables daily   65-80+ fluid ounces of water or unsweet tea for healthy kidneys   Limit to max 1 drink of alcohol per day; avoid smoking/tobacco   Limit animal fats in diet for cholesterol and heart health - choose grass fed whenever available   Avoid highly processed foods, and foods high in saturated/trans fats   Aim for low stress - take time to unwind and care for your mental health   Aim for 150 min of moderate intensity exercise weekly for heart health, and weights twice weekly for bone health   Aim for 7-9 hours of sleep daily   When it comes to diets, agreement about the perfect plan isnt easy to find, even among the experts. Experts at the [employer] developed an idea known as the Healthy Eating Plate. Just imagine a plate divided into logical, healthy portions.   The emphasis is on diet quality:   Load up on vegetables and fruits - one-half of your plate: Aim for color and variety, and remember that potatoes dont count.   Go for whole grains - one-quarter of your plate: Whole wheat, barley, wheat berries, quinoa, oats, brown rice, and foods made with them. If you want pasta, go with whole wheat pasta.   Protein power - one-quarter of your plate: Fish, chicken, beans, and nuts are all healthy,  versatile protein sources. Limit red meat.   The diet, however, does go beyond the plate, offering a few other  suggestions.   Use healthy plant oils, such as olive, canola, soy, corn, sunflower and peanut. Check the labels, and avoid partially hydrogenated oil, which have unhealthy trans fats.   If youre thirsty, drink water. Coffee and tea are good in moderation, but skip sugary drinks and limit milk and dairy products to one or two daily servings.   The type of carbohydrate in the diet is more important than the amount. Some sources of carbohydrates, such as vegetables, fruits, whole grains, and beans-are healthier than others.   Finally, stay active  Signed, Berniece Salines, DO  02/03/2020 11:45 AM    [place]

## 2020-02-04 LAB — CBC
Hematocrit: 43.2 % (ref 34.0–46.6)
Hemoglobin: 14.3 g/dL (ref 11.1–15.9)
MCH: 31.2 pg (ref 26.6–33.0)
MCHC: 33.1 g/dL (ref 31.5–35.7)
MCV: 94 fL (ref 79–97)
Platelets: 182 10*3/uL (ref 150–450)
RBC: 4.59 x10E6/uL (ref 3.77–5.28)
RDW: 12.4 % (ref 11.7–15.4)
WBC: 6.8 10*3/uL (ref 3.4–10.8)

## 2020-02-04 LAB — BASIC METABOLIC PANEL
BUN/Creatinine Ratio: 28 (ref 12–28)
BUN: 22 mg/dL (ref 8–27)
CO2: 30 mmol/L — ABNORMAL HIGH (ref 20–29)
Calcium: 10.5 mg/dL — ABNORMAL HIGH (ref 8.7–10.3)
Chloride: 99 mmol/L (ref 96–106)
Creatinine, Ser: 0.8 mg/dL (ref 0.57–1.00)
GFR calc Af Amer: 84 mL/min/{1.73_m2} (ref >59)
GFR calc non Af Amer: 73 mL/min/{1.73_m2} (ref >59)
Glucose: 84 mg/dL (ref 65–99)
Potassium: 4.2 mmol/L (ref 3.5–5.2)
Sodium: 140 mmol/L (ref 134–144)

## 2020-02-04 LAB — MAGNESIUM: Magnesium: 2 mg/dL (ref 1.6–2.3)

## 2020-02-10 ENCOUNTER — Telehealth (HOSPITAL_COMMUNITY): Payer: Self-pay | Admitting: Emergency Medicine

## 2020-02-10 NOTE — Telephone Encounter (Signed)
VM box full, cannot leave new message. Will attempt call later. Marchia Bond RN Navigator Cardiac Imaging [facility] [phone] Office  [phone] Cell

## 2020-02-11 ENCOUNTER — Ambulatory Visit (HOSPITAL_COMMUNITY): Admission: RE | Admit: 2020-02-11 | Payer: Medicare Other | Source: Ambulatory Visit

## 2020-02-11 ENCOUNTER — Encounter (HOSPITAL_BASED_OUTPATIENT_CLINIC_OR_DEPARTMENT_OTHER): Payer: Medicare Other | Admitting: Cardiovascular Disease

## 2020-02-13 ENCOUNTER — Other Ambulatory Visit: Payer: Self-pay | Admitting: Legal Medicine

## 2020-02-13 ENCOUNTER — Telehealth: Payer: Self-pay

## 2020-02-13 DIAGNOSIS — F4321 Adjustment disorder with depressed mood: Secondary | ICD-10-CM

## 2020-02-13 MED ORDER — DIAZEPAM 5 MG PO TABS: 5.0000 mg | ORAL_TABLET | Freq: Two times a day (BID) | ORAL | 1 refills | Status: DC | PRN

## 2020-02-13 NOTE — Telephone Encounter (Signed)
Patient is very upset and sad because her son was diagnosed with lung cancer stage 3 in [place]. She asked for some valium to [facility] in [place]. She made an appointment for Tuesday, but she is very stress.

## 2020-02-17 ENCOUNTER — Ambulatory Visit: Payer: Medicare Other | Admitting: Legal Medicine

## 2020-02-17 ENCOUNTER — Encounter: Payer: Self-pay | Admitting: Legal Medicine

## 2020-02-25 DIAGNOSIS — Z79899 Other long term (current) drug therapy: Secondary | ICD-10-CM | POA: Diagnosis not present

## 2020-02-25 DIAGNOSIS — Z5181 Encounter for therapeutic drug level monitoring: Secondary | ICD-10-CM | POA: Diagnosis not present

## 2020-03-01 ENCOUNTER — Encounter (HOSPITAL_BASED_OUTPATIENT_CLINIC_OR_DEPARTMENT_OTHER): Payer: Medicare Other | Admitting: Cardiovascular Disease

## 2020-03-18 ENCOUNTER — Telehealth: Payer: Self-pay | Admitting: Cardiology

## 2020-03-18 NOTE — Telephone Encounter (Signed)
Please routed this request to the patient's primary care provider.  This is a request as better handled by the PCP.  I do not think that is appropriate for me to set up paperwork for wheelchairs.

## 2020-03-18 NOTE — Telephone Encounter (Signed)
Spoke to the patient just now and let her know Dr. Tobb's recommendations. She verbalizes understanding and thanks me for the call back.  

## 2020-03-18 NOTE — Telephone Encounter (Signed)
[name] [name] is calling stating she left some paperwork at the office at her last appt on 02/03/20 in regards to getting a new power wheel chair due to her's that she has had for 12 years being broken. She states she has not heard anything back in regards to it since and was wanting to check on the status of it. Please advise.

## 2020-03-20 DIAGNOSIS — R109 Unspecified abdominal pain: Secondary | ICD-10-CM | POA: Diagnosis not present

## 2020-03-20 DIAGNOSIS — R197 Diarrhea, unspecified: Secondary | ICD-10-CM | POA: Diagnosis not present

## 2020-03-20 DIAGNOSIS — K449 Diaphragmatic hernia without obstruction or gangrene: Secondary | ICD-10-CM | POA: Diagnosis not present

## 2020-03-20 DIAGNOSIS — R6889 Other general symptoms and signs: Secondary | ICD-10-CM | POA: Diagnosis not present

## 2020-03-20 DIAGNOSIS — R1110 Vomiting, unspecified: Secondary | ICD-10-CM | POA: Diagnosis not present

## 2020-03-20 DIAGNOSIS — N200 Calculus of kidney: Secondary | ICD-10-CM | POA: Diagnosis not present

## 2020-03-20 DIAGNOSIS — R079 Chest pain, unspecified: Secondary | ICD-10-CM | POA: Diagnosis not present

## 2020-03-20 DIAGNOSIS — Z743 Need for continuous supervision: Secondary | ICD-10-CM | POA: Diagnosis not present

## 2020-03-20 DIAGNOSIS — R0789 Other chest pain: Secondary | ICD-10-CM | POA: Diagnosis not present

## 2020-03-20 DIAGNOSIS — R1032 Left lower quadrant pain: Secondary | ICD-10-CM | POA: Diagnosis not present

## 2020-03-23 ENCOUNTER — Telehealth: Payer: Self-pay | Admitting: *Deleted

## 2020-03-23 NOTE — Telephone Encounter (Signed)
Spoke with pt and let her know to let her Pcp handle ordering the hoveround chair for her. Pt acknowledged this instruction and stated she has appt with her pcp this week.

## 2020-03-23 NOTE — Telephone Encounter (Signed)
Called [name] [name] to let her know Dr. Agustin Cree would prefer she go through her PCP for the [facility] chair. Unable to leave message, mailbox full.

## 2020-03-30 ENCOUNTER — Other Ambulatory Visit: Payer: Self-pay

## 2020-03-30 ENCOUNTER — Ambulatory Visit (INDEPENDENT_AMBULATORY_CARE_PROVIDER_SITE_OTHER): Payer: Medicare Other | Admitting: Legal Medicine

## 2020-03-30 ENCOUNTER — Encounter: Payer: Self-pay | Admitting: Legal Medicine

## 2020-03-30 VITALS — BP 118/60 | HR 67 | Temp 97.6°F | Resp 16 | Ht 62.0 in | Wt 176.4 lb

## 2020-03-30 DIAGNOSIS — Z23 Encounter for immunization: Secondary | ICD-10-CM

## 2020-03-30 DIAGNOSIS — R6889 Other general symptoms and signs: Secondary | ICD-10-CM | POA: Diagnosis not present

## 2020-03-30 DIAGNOSIS — F322 Major depressive disorder, single episode, severe without psychotic features: Secondary | ICD-10-CM

## 2020-03-30 DIAGNOSIS — F4321 Adjustment disorder with depressed mood: Secondary | ICD-10-CM | POA: Diagnosis not present

## 2020-03-30 DIAGNOSIS — K529 Noninfective gastroenteritis and colitis, unspecified: Secondary | ICD-10-CM | POA: Diagnosis not present

## 2020-03-30 DIAGNOSIS — F330 Major depressive disorder, recurrent, mild: Secondary | ICD-10-CM | POA: Insufficient documentation

## 2020-03-30 HISTORY — DX: Major depressive disorder, single episode, severe without psychotic features: F32.2

## 2020-03-30 HISTORY — DX: Major depressive disorder, recurrent, mild: F33.0

## 2020-03-30 MED ORDER — BUPROPION HCL ER (XL) 150 MG PO TB24: 150.0000 mg | ORAL_TABLET | Freq: Every day | ORAL | 3 refills | Status: DC

## 2020-03-30 MED ORDER — PROMETHAZINE HCL 25 MG PO TABS: 25.0000 mg | ORAL_TABLET | Freq: Four times a day (QID) | ORAL | 3 refills | Status: DC | PRN

## 2020-03-30 MED ORDER — DIAZEPAM 5 MG PO TABS: 5.0000 mg | ORAL_TABLET | Freq: Two times a day (BID) | ORAL | 3 refills | Status: DC | PRN

## 2020-03-30 NOTE — Progress Notes (Signed)
Acute Office Visit  Subjective:    Patient ID: [name] [name], female    DOB: 1945-06-02, 74 y.o.   MRN: [id number]  Chief Complaint  Patient presents with  . Hospitalization Follow-up    has food poisoning-chronic diarrhea, still feels nauseous but when she eats stomach makes loud noises an diarrhea is gone-med refills-omeprazole and phenergan, needs to powerwheel chair    HPI Patient is in today for follow up from ER visit for gastroenteritis.  She is now keep food down  And is eating well.  No diarrhea.  This patient has major depression for 6 months.  PHQ9 =16.  Patient is having less anhedonia.  The patient has less future plans and prospects.  The depression is worse with son having lung cancer.  The patient is not exercising and working on behavior to improve mental health.  Patient is not seeing a therapist or psychiatrist.  na  Patient is on buproprion. Past Medical History:  Diagnosis Date  . Chronic left shoulder pain 09/27/2015  . COPD (chronic obstructive pulmonary disease) ([place])   . Enlarged aorta ([place])   . Hyperlipidemia   . Osteoarthritis   . Pain of right hip joint 11/22/2015  . Primary osteoarthritis of left knee 02/04/2015  . S/P total knee arthroplasty 02/15/2015  . SOB (shortness of breath)     Past Surgical History:  Procedure Laterality Date  . ABDOMINAL HYSTERECTOMY    . BREAST BIOPSY Right   . CHOLECYSTECTOMY    . COLONOSCOPY  09/04/2011   Colonic polyps, status post polyectomy. Incidental small ascending colon lipoma  . ESOPHAGOGASTRODUODENOSCOPY  09/08/2015   Schatzki ring status post esophageal dilitation. Small hiatal hernia  . hemorrhoid surgery    . KNEE SURGERY     left  . TOTAL KNEE ARTHROPLASTY Left 02/15/2015   Procedure: TOTAL KNEE ARTHROPLASTY;  Surgeon: Vickey Huger, MD;  Location: [facility];  Service: Orthopedics;  Laterality: Left;  . TUBAL LIGATION    . WRIST SURGERY     right    Family History  Problem Relation Age of Onset  . Heart  disease Mother   . Cancer Father        unknown  . Diabetes Father   . Heart disease Father   . Stroke Father   . Lupus Sister   . Seizures Son   . COPD Son     Social History   Socioeconomic History  . Marital status: Widowed    Spouse name: Not on file  . Number of children: 4  . Years of education: Not on file  . Highest education level: Not on file  Occupational History  . Occupation: Retired  Tobacco Use  . Smoking status: Current Every Day Smoker    Packs/day: 2.00    Years: 47.00    Pack years: 94.00    Types: Cigarettes  . Smokeless tobacco: Never Used  . Tobacco comment: down to 1 pack a day  Vaping Use  . Vaping Use: Never used  Substance and Sexual Activity  . Alcohol use: No  . Drug use: No  . Sexual activity: Not Currently  Other Topics Concern  . Not on file  Social History Narrative  . Not on file   Social Determinants of Health   Financial Resource Strain:   . Difficulty of Paying Living Expenses: Not on file  Food Insecurity:   . Worried About Charity fundraiser in the Last Year: Not on file  . Ran  Out of Food in the Last Year: Not on file  Transportation Needs:   . Lack of Transportation (Medical): Not on file  . Lack of Transportation (Non-Medical): Not on file  Physical Activity:   . Days of Exercise per Week: Not on file  . Minutes of Exercise per Session: Not on file  Stress:   . Feeling of Stress : Not on file  Social Connections:   . Frequency of Communication with Friends and Family: Not on file  . Frequency of Social Gatherings with Friends and Family: Not on file  . Attends Religious Services: Not on file  . Active Member of Clubs or Organizations: Not on file  . Attends Archivist Meetings: Not on file  . Marital Status: Not on file  Intimate Partner Violence:   . Fear of Current or Ex-Partner: Not on file  . Emotionally Abused: Not on file  . Physically Abused: Not on file  . Sexually Abused: Not on file     Outpatient Medications Prior to Visit  Medication Sig Dispense Refill  . albuterol (PROVENTIL) (2.5 MG/3ML) 0.083% nebulizer solution Take 3 mLs (2.5 mg total) by nebulization every 6 (six) hours as needed for wheezing or shortness of breath. 75 mL 12  . albuterol (VENTOLIN HFA) 108 (90 Base) MCG/ACT inhaler Inhale 2 puffs into the lungs every 6 (six) hours as needed for wheezing or shortness of breath. 8 g 6  . atorvastatin (LIPITOR) 40 MG tablet daily.     . Budeson-Glycopyrrol-Formoterol 160-9-4.8 MCG/ACT AERO Inhale 2 puffs into the lungs in the morning and at bedtime. 10.7 g 6  . hydrochlorothiazide (MICROZIDE) 12.5 MG capsule Take 1 capsule (12.5 mg total) by mouth daily. 90 capsule 3  . ibuprofen (ADVIL) 600 MG tablet Take 600 mg by mouth as needed.     Marland Kitchen lisinopril (ZESTRIL) 20 MG tablet Take 1 tablet (20 mg total) by mouth in the morning and at bedtime. 180 tablet 3  . loperamide (IMODIUM) 2 MG capsule Take by mouth.    . nitroGLYCERIN (NITROSTAT) 0.4 MG SL tablet Place 1 tablet (0.4 mg total) under the tongue every 5 (five) minutes as needed for chest pain. 25 tablet 3  . omeprazole (PRILOSEC) 40 MG capsule TAKE 1 CAPSULE(40 MG) BY MOUTH DAILY AS NEEDED 30 capsule 2  . oxyCODONE-acetaminophen (PERCOCET) 10-325 MG tablet Take 1 tablet by mouth every 6 (six) hours as needed.    . potassium chloride SA (KLOR-CON) 20 MEQ tablet Take 1 tablet (20 mEq total) by mouth daily. (Patient taking differently: Take 20 mEq by mouth daily as needed. ) 30 tablet 3  . traZODone (DESYREL) 100 MG tablet TAKE 2 TABLETS(200 MG) BY MOUTH AT BEDTIME 60 tablet 6  . diazepam (VALIUM) 5 MG tablet Take 1 tablet (5 mg total) by mouth every 12 (twelve) hours as needed for anxiety. Watch for sedation 30 tablet 1  . promethazine (PHENERGAN) 25 MG tablet Take 1 tablet (25 mg total) by mouth every 6 (six) hours as needed for nausea or vomiting. 30 tablet 0  . Vitamin D, Ergocalciferol, (DRISDOL) 1.25 MG (50000 UNIT)  CAPS capsule Take 1 capsule (50,000 Units total) by mouth every 7 (seven) days. (Patient not taking: Reported on 03/30/2020) 12 capsule 0   No facility-administered medications prior to visit.    Allergies  Allergen Reactions  . Meperidine Anaphylaxis    Blood pressure dropped  . Influenza Vaccines     Flu like symptoms    Review  of Systems  Constitutional: Negative.   HENT: Negative.   Eyes: Negative.   Respiratory: Negative for cough, shortness of breath and stridor.   Cardiovascular: Negative for chest pain, palpitations and leg swelling.  Gastrointestinal: Negative.   Genitourinary: Negative.   Musculoskeletal: Negative.   Skin: Negative.   Neurological: Negative.   Psychiatric/Behavioral: Negative.        Objective:    Physical Exam Vitals reviewed.  Constitutional:      Appearance: Normal appearance.  HENT:     Head: Normocephalic and atraumatic.     Right Ear: Tympanic membrane normal.     Left Ear: Tympanic membrane normal.     Mouth/Throat:     Mouth: Mucous membranes are moist.     Pharynx: Oropharynx is clear.  Eyes:     Extraocular Movements: Extraocular movements intact.     Conjunctiva/sclera: Conjunctivae normal.     Pupils: Pupils are equal, round, and reactive to light.  Cardiovascular:     Rate and Rhythm: Normal rate and regular rhythm.     Pulses: Normal pulses.     Heart sounds: Normal heart sounds.  Pulmonary:     Effort: Pulmonary effort is normal.     Breath sounds: Normal breath sounds.  Abdominal:     General: Abdomen is flat. Bowel sounds are normal.     Palpations: Abdomen is soft.  Musculoskeletal:        General: Normal range of motion.     Cervical back: Normal range of motion and neck supple.  Skin:    General: Skin is warm and dry.     Capillary Refill: Capillary refill takes less than 2 seconds.  Neurological:     General: No focal deficit present.     Mental Status: She is alert and oriented to person, place, and time.      BP 118/60   Pulse 67   Temp 97.6 F (36.4 C)   Resp 16   Ht 5\' 2"  (1.575 m)   Wt 176 lb 6.4 oz (80 kg)   SpO2 99%   BMI 32.26 kg/m  Wt Readings from Last 3 Encounters:  03/30/20 176 lb 6.4 oz (80 kg)  02/03/20 162 lb 3.2 oz (73.6 kg)  12/10/19 176 lb (79.8 kg)    Health Maintenance Due  Topic Date Due  . Hepatitis C Screening  Never done  . OPHTHALMOLOGY EXAM  Never done  . COVID-19 Vaccine (1) Never done  . TETANUS/TDAP  Never done  . MAMMOGRAM  Never done  . COLONOSCOPY  Never done  . DEXA SCAN  Never done  . PNA vac Low Risk Adult (1 of 2 - PCV13) Never done  . HEMOGLOBIN A1C  02/11/2020    There are no preventive care reminders to display for this patient.   No results found for: TSH Lab Results  Component Value Date   WBC 6.8 02/03/2020   HGB 14.3 02/03/2020   HCT 43.2 02/03/2020   MCV 94 02/03/2020   PLT 182 02/03/2020   Lab Results  Component Value Date   NA 140 02/03/2020   K 4.2 02/03/2020   CO2 30 (H) 02/03/2020   GLUCOSE 84 02/03/2020   BUN 22 02/03/2020   CREATININE 0.80 02/03/2020   BILITOT 0.3 08/11/2019   ALKPHOS 88 08/11/2019   AST 17 08/11/2019   ALT 9 08/11/2019   PROT 6.4 08/11/2019   ALBUMIN 4.1 08/11/2019   CALCIUM 10.5 (H) 02/03/2020   ANIONGAP 6 02/16/2015  Lab Results  Component Value Date   CHOL 160 08/11/2019   Lab Results  Component Value Date   HDL 65 08/11/2019   Lab Results  Component Value Date   LDLCALC 81 08/11/2019   Lab Results  Component Value Date   TRIG 74 08/11/2019   Lab Results  Component Value Date   CHOLHDL 2.5 08/11/2019   Lab Results  Component Value Date   HGBA1C 5.1 08/11/2019       Assessment & Plan:  1. Acute gastroenteritis - promethazine (PHENERGAN) 25 MG tablet; Take 1 tablet (25 mg total) by mouth every 6 (six) hours as needed for nausea or vomiting.  Dispense: 30 tablet; Refill: 3 Patient is here for follow-up from emergency room where she had gastroenteritis from food  poisoning.  She now is having no abdominal pain nausea vomiting and is well hydrated.   2. Depression, major, single episode, severe (HCC) - buPROPion (WELLBUTRIN XL) 150 MG 24 hr tablet; Take 1 tablet (150 mg total) by mouth daily.  Dispense: 30 tablet; Refill: 3 Patient's depression has gotten much worse since she found out her son has stage III lung cancer he was a chronic smoker and lives in [place] which she is unable to go see.  She feels guilty about this.  We will started bupropion which worked well in the past.  Patient wanted the [facility] evaluation.  She has an old one at the present time that apparently was covered by [employer].  Patient does not require an elective vehicle for ADLs.  She wants to use it so she can travel downtown and she.  I declined to complete an evaluation for this type use since Medicare tube  will not pay for it unless it is needed for ADL function in the home.  Meds ordered this encounter  Medications  . promethazine (PHENERGAN) 25 MG tablet    Sig: Take 1 tablet (25 mg total) by mouth every 6 (six) hours as needed for nausea or vomiting.    Dispense:  30 tablet    Refill:  3  . buPROPion (WELLBUTRIN XL) 150 MG 24 hr tablet    Sig: Take 1 tablet (150 mg total) by mouth daily.    Dispense:  30 tablet    Refill:  3  . diazepam (VALIUM) 5 MG tablet    Sig: Take 1 tablet (5 mg total) by mouth every 12 (twelve) hours as needed for anxiety. Watch for sedation    Dispense:  30 tablet    Refill:  3    Orders Placed This Encounter  Procedures  . Flu Vaccine QUAD High Dose(Fluad)     Follow-up: Return in about 2 weeks (around 04/13/2020), or for chronic visit.  An After Visit Summary was printed and given to the patient.  [place] [phone]

## 2020-04-13 ENCOUNTER — Ambulatory Visit: Payer: Medicare Other | Admitting: Legal Medicine

## 2020-04-15 ENCOUNTER — Ambulatory Visit (INDEPENDENT_AMBULATORY_CARE_PROVIDER_SITE_OTHER): Payer: Medicare Other | Admitting: Legal Medicine

## 2020-04-15 ENCOUNTER — Other Ambulatory Visit: Payer: Self-pay

## 2020-04-15 ENCOUNTER — Encounter: Payer: Self-pay | Admitting: Legal Medicine

## 2020-04-15 VITALS — BP 150/80 | HR 71 | Temp 97.9°F | Resp 17 | Ht 62.0 in | Wt 174.0 lb

## 2020-04-15 DIAGNOSIS — F4321 Adjustment disorder with depressed mood: Secondary | ICD-10-CM

## 2020-04-15 DIAGNOSIS — J449 Chronic obstructive pulmonary disease, unspecified: Secondary | ICD-10-CM

## 2020-04-15 DIAGNOSIS — F322 Major depressive disorder, single episode, severe without psychotic features: Secondary | ICD-10-CM | POA: Diagnosis not present

## 2020-04-15 MED ORDER — OMEPRAZOLE 40 MG PO CPDR: 40.0000 mg | DELAYED_RELEASE_CAPSULE | Freq: Every day | ORAL | 2 refills | Status: DC

## 2020-04-15 NOTE — Progress Notes (Signed)
Subjective:  Patient ID: [name] [name], female    DOB: August 10, 1945  Age: 74 y.o. MRN: [id number]  Chief Complaint  Patient presents with  . Depression    HPI: patient is having less anhedonia and is getting out.  She is in pain and she is seeing pain clinic.  She is on oxycodone. But pain 9/10.  She is on bupropriaon and is doing better but not well.  This patient has major depression for 6 month.  PHQ9 =17.  Patient is having less anhedonia,  anhedonia.  The patient has improved future plans and prospects.  The depression is worse with son's maliganancy.  The patient is not exercising and working on behavior to improve mental health.  Patient is not seeing a therapist or psychiatrist.  na  Patient is on buproprion..   Current Outpatient Medications on File Prior to Visit  Medication Sig Dispense Refill  . albuterol (PROVENTIL) (2.5 MG/3ML) 0.083% nebulizer solution Take 3 mLs (2.5 mg total) by nebulization every 6 (six) hours as needed for wheezing or shortness of breath. 75 mL 12  . albuterol (VENTOLIN HFA) 108 (90 Base) MCG/ACT inhaler Inhale 2 puffs into the lungs every 6 (six) hours as needed for wheezing or shortness of breath. 8 g 6  . atorvastatin (LIPITOR) 40 MG tablet daily.     . Budeson-Glycopyrrol-Formoterol 160-9-4.8 MCG/ACT AERO Inhale 2 puffs into the lungs in the morning and at bedtime. 10.7 g 6  . buPROPion (WELLBUTRIN XL) 150 MG 24 hr tablet Take 1 tablet (150 mg total) by mouth daily. 30 tablet 3  . diazepam (VALIUM) 5 MG tablet Take 1 tablet (5 mg total) by mouth every 12 (twelve) hours as needed for anxiety. Watch for sedation 30 tablet 3  . ibuprofen (ADVIL) 600 MG tablet Take 600 mg by mouth as needed.     Marland Kitchen lisinopril-hydrochlorothiazide (ZESTORETIC) 20-12.5 MG tablet Take 2 tablets by mouth daily.    Marland Kitchen loperamide (IMODIUM) 2 MG capsule Take by mouth.    . oxyCODONE-acetaminophen (PERCOCET) 10-325 MG tablet Take 1 tablet by mouth every 6 (six) hours as needed.    .  potassium chloride SA (KLOR-CON) 20 MEQ tablet Take 1 tablet (20 mEq total) by mouth daily. (Patient taking differently: Take 20 mEq by mouth daily as needed. ) 30 tablet 3  . promethazine (PHENERGAN) 25 MG tablet Take 1 tablet (25 mg total) by mouth every 6 (six) hours as needed for nausea or vomiting. 30 tablet 3  . traZODone (DESYREL) 100 MG tablet TAKE 2 TABLETS(200 MG) BY MOUTH AT BEDTIME 60 tablet 6  . Vitamin D, Ergocalciferol, (DRISDOL) 1.25 MG (50000 UNIT) CAPS capsule Take 1 capsule (50,000 Units total) by mouth every 7 (seven) days. 12 capsule 0  . nitroGLYCERIN (NITROSTAT) 0.4 MG SL tablet Place 1 tablet (0.4 mg total) under the tongue every 5 (five) minutes as needed for chest pain. 25 tablet 3   No current facility-administered medications on file prior to visit.   Past Medical History:  Diagnosis Date  . Chronic left shoulder pain 09/27/2015  . COPD (chronic obstructive pulmonary disease) ([place])   . Enlarged aorta ([place])   . Hyperlipidemia   . Osteoarthritis   . Pain of right hip joint 11/22/2015  . Primary osteoarthritis of left knee 02/04/2015  . S/P total knee arthroplasty 02/15/2015  . SOB (shortness of breath)    Past Surgical History:  Procedure Laterality Date  . ABDOMINAL HYSTERECTOMY    . BREAST BIOPSY  Right   . CHOLECYSTECTOMY    . COLONOSCOPY  09/04/2011   Colonic polyps, status post polyectomy. Incidental small ascending colon lipoma  . ESOPHAGOGASTRODUODENOSCOPY  09/08/2015   Schatzki ring status post esophageal dilitation. Small hiatal hernia  . hemorrhoid surgery    . KNEE SURGERY     left  . TOTAL KNEE ARTHROPLASTY Left 02/15/2015   Procedure: TOTAL KNEE ARTHROPLASTY;  Surgeon: Vickey Huger, MD;  Location: [place];  Service: Orthopedics;  Laterality: Left;  . TUBAL LIGATION    . WRIST SURGERY     right    Family History  Problem Relation Age of Onset  . Heart disease Mother   . Cancer Father        unknown  . Diabetes Father   . Heart disease Father   .  Stroke Father   . Lupus Sister   . Seizures Son   . COPD Son    Social History   Socioeconomic History  . Marital status: Widowed    Spouse name: Not on file  . Number of children: 4  . Years of education: Not on file  . Highest education level: Not on file  Occupational History  . Occupation: Retired  Tobacco Use  . Smoking status: Current Every Day Smoker    Packs/day: 2.00    Years: 47.00    Pack years: 94.00    Types: Cigarettes  . Smokeless tobacco: Never Used  . Tobacco comment: down to 1 pack a day  Vaping Use  . Vaping Use: Never used  Substance and Sexual Activity  . Alcohol use: No  . Drug use: No  . Sexual activity: Not Currently  Other Topics Concern  . Not on file  Social History Narrative  . Not on file   Social Determinants of Health   Financial Resource Strain:   . Difficulty of Paying Living Expenses: Not on file  Food Insecurity:   . Worried About Charity fundraiser in the Last Year: Not on file  . Ran Out of Food in the Last Year: Not on file  Transportation Needs:   . Lack of Transportation (Medical): Not on file  . Lack of Transportation (Non-Medical): Not on file  Physical Activity:   . Days of Exercise per Week: Not on file  . Minutes of Exercise per Session: Not on file  Stress:   . Feeling of Stress : Not on file  Social Connections:   . Frequency of Communication with Friends and Family: Not on file  . Frequency of Social Gatherings with Friends and Family: Not on file  . Attends Religious Services: Not on file  . Active Member of Clubs or Organizations: Not on file  . Attends Archivist Meetings: Not on file  . Marital Status: Not on file    Review of Systems  Constitutional: Negative for activity change and appetite change.  HENT: Negative for congestion, dental problem and drooling.   Eyes: Negative for visual disturbance.  Respiratory: Negative for cough, choking, chest tightness and wheezing.   Cardiovascular:  Negative for chest pain, palpitations and leg swelling.  Endocrine: Negative.   Genitourinary: Negative for dyspareunia.  Musculoskeletal: Negative.   Neurological: Negative.   Psychiatric/Behavioral: Positive for dysphoric mood.     Objective:  BP (!) 150/80   Pulse 71   Temp 97.9 F (36.6 C)   Resp 17   Ht 5\' 2"  (1.575 m)   Wt 174 lb (78.9 kg)   SpO2 96%  BMI 31.83 kg/m   BP/Weight 04/15/2020 78/06/9560 06/01/863  Systolic BP 784 696 295  Diastolic BP 80 60 86  Wt. (Lbs) 174 176.4 162.2  BMI 31.83 32.26 29.67    Physical Exam Vitals reviewed.  Constitutional:      Appearance: Normal appearance.  HENT:     Head: Normocephalic and atraumatic.  Eyes:     Extraocular Movements: Extraocular movements intact.     Conjunctiva/sclera: Conjunctivae normal.     Pupils: Pupils are equal, round, and reactive to light.  Cardiovascular:     Rate and Rhythm: Normal rate and regular rhythm.     Pulses: Normal pulses.     Heart sounds: Normal heart sounds.  Pulmonary:     Effort: Pulmonary effort is normal.     Breath sounds: Normal breath sounds.  Musculoskeletal:     Cervical back: Neck supple.  Neurological:     General: No focal deficit present.     Mental Status: She is alert and oriented to person, place, and time.  Psychiatric:     Comments: depression    Depression screen [facility] 2/9 04/15/2020 03/30/2020 08/13/2019  Decreased Interest 1 2 1   Down, Depressed, Hopeless 2 1 2   PHQ - 2 Score 3 3 3   Altered sleeping 3 - 1  Tired, decreased energy 3 1 1   Change in appetite 2 1 1   Feeling bad or failure about yourself  3 0 1  Trouble concentrating 3 1 1   Moving slowly or fidgety/restless - - 1  Suicidal thoughts 0 0 0  PHQ-9 Score 17 - 9  Difficult doing work/chores Extremely dIfficult Somewhat difficult Somewhat difficult      Lab Results  Component Value Date   WBC 6.8 02/03/2020   HGB 14.3 02/03/2020   HCT 43.2 02/03/2020   PLT 182 02/03/2020   GLUCOSE 84  02/03/2020   CHOL 160 08/11/2019   TRIG 74 08/11/2019   HDL 65 08/11/2019   LDLCALC 81 08/11/2019   ALT 9 08/11/2019   AST 17 08/11/2019   NA 140 02/03/2020   K 4.2 02/03/2020   CL 99 02/03/2020   CREATININE 0.80 02/03/2020   BUN 22 02/03/2020   CO2 30 (H) 02/03/2020   INR 1.02 01/28/2015   HGBA1C 5.1 08/11/2019      Assessment & Plan:   1. Depression, major, single episode, severe ([place]) Patient's depression is partially controlled with wellbutrin.   Anhedonia better.  PHQ 9 was performed score 17. An individual care plan was established or reinforced today.  The patient's disease status was assessed using clinical findings on exam, labs, and or other diagnostic testing to determine patient's success in meeting treatment goals based on disease specific evidence-based guidelines and found to be improving Recommendations include stay on medicine  2. Grief  Patient still has grief over death. She is slowly improving       I spent 20 minutes dedicated to the care of this patient on the date of this encounter to include face-to-face time with the patient, as well as: Preparing to see the patient (review of tests). Obtaining and/or reviewing separately obtained history. Performing a medically appropriate examination and/or evaluation. Counseling and educating the patient/family/caregiver. Ordering medications, tests, or procedures. Communicating with other health care professionals (not separately reported). Documenting clinical information in the electronic or other health record. Independently interpreting results (not separately reported) and communicating results to the patient/family/caregiver.  My nursing staff have aided in the documentation of this note on the  behalf of [name], MD,as directed by  [name], MD and thoroughly reviewed by [name], MD.  Follow-up: Return in about 1 month (around 05/15/2020).  An After Visit Summary was printed and  given to the patient.  [name], MD [facility] [phone]

## 2020-05-13 ENCOUNTER — Ambulatory Visit: Payer: Medicare Other | Admitting: Legal Medicine

## 2020-06-24 DIAGNOSIS — G894 Chronic pain syndrome: Secondary | ICD-10-CM | POA: Diagnosis not present

## 2020-06-24 DIAGNOSIS — Z79891 Long term (current) use of opiate analgesic: Secondary | ICD-10-CM | POA: Diagnosis not present

## 2020-06-24 DIAGNOSIS — M5416 Radiculopathy, lumbar region: Secondary | ICD-10-CM | POA: Diagnosis not present

## 2020-06-24 DIAGNOSIS — M5412 Radiculopathy, cervical region: Secondary | ICD-10-CM | POA: Diagnosis not present

## 2020-07-08 ENCOUNTER — Other Ambulatory Visit: Payer: Self-pay

## 2020-07-08 MED ORDER — TRAZODONE HCL 100 MG PO TABS
ORAL_TABLET | ORAL | 6 refills | Status: DC
Start: 2020-07-08 — End: 2020-07-23

## 2020-07-14 ENCOUNTER — Other Ambulatory Visit: Payer: Self-pay

## 2020-07-14 ENCOUNTER — Ambulatory Visit (INDEPENDENT_AMBULATORY_CARE_PROVIDER_SITE_OTHER): Payer: Medicare Other | Admitting: Legal Medicine

## 2020-07-14 ENCOUNTER — Encounter: Payer: Self-pay | Admitting: Legal Medicine

## 2020-07-14 VITALS — BP 150/80 | HR 79 | Temp 98.1°F | Resp 16 | Ht 66.0 in | Wt 172.0 lb

## 2020-07-14 DIAGNOSIS — E782 Mixed hyperlipidemia: Secondary | ICD-10-CM

## 2020-07-14 DIAGNOSIS — I10 Essential (primary) hypertension: Secondary | ICD-10-CM | POA: Diagnosis not present

## 2020-07-14 DIAGNOSIS — I700 Atherosclerosis of aorta: Secondary | ICD-10-CM

## 2020-07-14 DIAGNOSIS — I471 Supraventricular tachycardia: Secondary | ICD-10-CM

## 2020-07-14 DIAGNOSIS — E1169 Type 2 diabetes mellitus with other specified complication: Secondary | ICD-10-CM | POA: Diagnosis not present

## 2020-07-14 DIAGNOSIS — R61 Generalized hyperhidrosis: Secondary | ICD-10-CM

## 2020-07-14 DIAGNOSIS — J449 Chronic obstructive pulmonary disease, unspecified: Secondary | ICD-10-CM

## 2020-07-14 DIAGNOSIS — F322 Major depressive disorder, single episode, severe without psychotic features: Secondary | ICD-10-CM

## 2020-07-14 DIAGNOSIS — I4719 Other supraventricular tachycardia: Secondary | ICD-10-CM

## 2020-07-14 MED ORDER — CLONIDINE HCL 0.1 MG PO TABS: 0.1000 mg | ORAL_TABLET | Freq: Two times a day (BID) | ORAL | 3 refills | Status: DC

## 2020-07-14 NOTE — Progress Notes (Signed)
Subjective:  Patient ID: [name] [name], female    DOB: 1946-05-15  Age: 75 y.o. MRN: [id number]  Chief Complaint  Patient presents with  . Hypertension  . Diabetes  . Depression    HPI: Chronic visit  Patient;s BP is very high 182/100. She is on lisinopril/HCTZ.  She has night sweats for 2 months. Patient presents for follow up of hypertension.  Patient tolerating lisnopril/HCTZ well with side effects.  Patient was diagnosed with hypertension 2010 so has been treated for hypertension for 10 years.Patient is working on maintaining diet and exercise regimen and follows up as directed. Complication include none  .no weight loss but having night sweats.   Current Outpatient Medications on File Prior to Visit  Medication Sig Dispense Refill  . albuterol (PROVENTIL) (2.5 MG/3ML) 0.083% nebulizer solution Take 3 mLs (2.5 mg total) by nebulization every 6 (six) hours as needed for wheezing or shortness of breath. 75 mL 12  . albuterol (VENTOLIN HFA) 108 (90 Base) MCG/ACT inhaler Inhale 2 puffs into the lungs every 6 (six) hours as needed for wheezing or shortness of breath. 8 g 6  . atorvastatin (LIPITOR) 40 MG tablet daily.     . Budeson-Glycopyrrol-Formoterol 160-9-4.8 MCG/ACT AERO Inhale 2 puffs into the lungs in the morning and at bedtime. 10.7 g 6  . buPROPion (WELLBUTRIN XL) 150 MG 24 hr tablet Take 1 tablet (150 mg total) by mouth daily. 30 tablet 3  . diazepam (VALIUM) 5 MG tablet Take 1 tablet (5 mg total) by mouth every 12 (twelve) hours as needed for anxiety. Watch for sedation 30 tablet 3  . ibuprofen (ADVIL) 600 MG tablet Take 600 mg by mouth as needed.     Marland Kitchen lisinopril-hydrochlorothiazide (ZESTORETIC) 20-12.5 MG tablet Take 2 tablets by mouth daily.    Marland Kitchen loperamide (IMODIUM) 2 MG capsule Take by mouth.    Marland Kitchen omeprazole (PRILOSEC) 40 MG capsule Take 1 capsule (40 mg total) by mouth daily. 90 capsule 2  . oxyCODONE-acetaminophen (PERCOCET) 10-325 MG tablet Take 1 tablet by mouth  every 6 (six) hours as needed.    . potassium chloride SA (KLOR-CON) 20 MEQ tablet Take 1 tablet (20 mEq total) by mouth daily. (Patient taking differently: Take 20 mEq by mouth daily as needed.) 30 tablet 3  . promethazine (PHENERGAN) 25 MG tablet Take 1 tablet (25 mg total) by mouth every 6 (six) hours as needed for nausea or vomiting. 30 tablet 3  . traZODone (DESYREL) 100 MG tablet TAKE 2 TABLETS(200 MG) BY MOUTH AT BEDTIME 60 tablet 6  . Vitamin D, Ergocalciferol, (DRISDOL) 1.25 MG (50000 UNIT) CAPS capsule Take 1 capsule (50,000 Units total) by mouth every 7 (seven) days. 12 capsule 0  . nitroGLYCERIN (NITROSTAT) 0.4 MG SL tablet Place 1 tablet (0.4 mg total) under the tongue every 5 (five) minutes as needed for chest pain. 25 tablet 3   No current facility-administered medications on file prior to visit.   Past Medical History:  Diagnosis Date  . Chronic left shoulder pain 09/27/2015  . COPD (chronic obstructive pulmonary disease) ([place])   . Enlarged aorta ([place])   . Hyperlipidemia   . Osteoarthritis   . Pain of right hip joint 11/22/2015  . Primary osteoarthritis of left knee 02/04/2015  . S/P total knee arthroplasty 02/15/2015  . SOB (shortness of breath)    Past Surgical History:  Procedure Laterality Date  . ABDOMINAL HYSTERECTOMY    . BREAST BIOPSY Right   . CHOLECYSTECTOMY    .  COLONOSCOPY  09/04/2011   Colonic polyps, status post polyectomy. Incidental small ascending colon lipoma  . ESOPHAGOGASTRODUODENOSCOPY  09/08/2015   Schatzki ring status post esophageal dilitation. Small hiatal hernia  . hemorrhoid surgery    . KNEE SURGERY     left  . TOTAL KNEE ARTHROPLASTY Left 02/15/2015   Procedure: TOTAL KNEE ARTHROPLASTY;  Surgeon: Vickey Huger, MD;  Location: [facility];  Service: Orthopedics;  Laterality: Left;  . TUBAL LIGATION    . WRIST SURGERY     right    Family History  Problem Relation Age of Onset  . Heart disease Mother   . Cancer Father        unknown  . Diabetes  Father   . Heart disease Father   . Stroke Father   . Lupus Sister   . Seizures Son   . COPD Son    Social History   Socioeconomic History  . Marital status: Widowed    Spouse name: Not on file  . Number of children: 4  . Years of education: Not on file  . Highest education level: Not on file  Occupational History  . Occupation: Retired  Tobacco Use  . Smoking status: Current Every Day Smoker    Packs/day: 1.50    Years: 47.00    Pack years: 70.50    Types: Cigarettes  . Smokeless tobacco: Never Used  . Tobacco comment: down to 1 pack a day  Vaping Use  . Vaping Use: Never used  Substance and Sexual Activity  . Alcohol use: No  . Drug use: No  . Sexual activity: Not Currently  Other Topics Concern  . Not on file  Social History Narrative  . Not on file   Social Determinants of Health   Financial Resource Strain: Not on file  Food Insecurity: Not on file  Transportation Needs: Not on file  Physical Activity: Not on file  Stress: Not on file  Social Connections: Not on file    Review of Systems  Constitutional: Negative for activity change, appetite change and fatigue.  HENT: Negative for congestion and sinus pain.   Eyes: Negative for visual disturbance.  Respiratory: Negative for chest tightness and shortness of breath.   Cardiovascular: Negative for chest pain, palpitations and leg swelling.  Gastrointestinal: Negative for abdominal distention and abdominal pain.  Endocrine: Negative for polyuria.  Genitourinary: Negative for difficulty urinating, dyspareunia and urgency.  Musculoskeletal: Negative for arthralgias and back pain.  Skin: Negative.   Neurological: Negative.   Psychiatric/Behavioral: Negative.      Objective:  BP (!) 150/80   Pulse 79   Temp 98.1 F (36.7 C)   Resp 16   Ht 5\' 6"  (1.676 m)   Wt 172 lb (78 kg)   SpO2 97%   BMI 27.76 kg/m   BP/Weight 07/14/2020 04/15/2020 35/09/7320  Systolic BP 025 427 062  Diastolic BP 80 80 60   Wt. (Lbs) 172 174 176.4  BMI 27.76 31.83 32.26    Physical Exam Vitals reviewed.  Constitutional:      Appearance: Normal appearance.  HENT:     Head: Normocephalic and atraumatic.     Right Ear: Tympanic membrane normal.     Left Ear: Tympanic membrane normal.     Mouth/Throat:     Mouth: Mucous membranes are moist.     Pharynx: Oropharynx is clear.  Eyes:     Extraocular Movements: Extraocular movements intact.     Conjunctiva/sclera: Conjunctivae normal.  Pupils: Pupils are equal, round, and reactive to light.  Cardiovascular:     Rate and Rhythm: Normal rate and regular rhythm.     Pulses: Normal pulses.     Heart sounds: No murmur heard. No gallop.   Pulmonary:     Effort: Pulmonary effort is normal.     Breath sounds: Normal breath sounds. No rales.  Abdominal:     General: Abdomen is flat. Bowel sounds are normal. There is no distension.     Palpations: Abdomen is soft.     Tenderness: There is no abdominal tenderness.  Musculoskeletal:        General: Normal range of motion.     Cervical back: Normal range of motion and neck supple.  Skin:    General: Skin is warm and dry.     Capillary Refill: Capillary refill takes less than 2 seconds.  Neurological:     General: No focal deficit present.     Mental Status: She is alert and oriented to person, place, and time. Mental status is at baseline.    Depression screen [facility] 2/9 07/14/2020 04/15/2020 03/30/2020 08/13/2019  Decreased Interest 1 1 2 1   Down, Depressed, Hopeless 2 2 1 2   PHQ - 2 Score 3 3 3 3   Altered sleeping 2 3 - 1  Tired, decreased energy 2 3 1 1   Change in appetite 2 2 1 1   Feeling bad or failure about yourself  2 3 0 1  Trouble concentrating 1 3 1 1   Moving slowly or fidgety/restless 1 - - 1  Suicidal thoughts 0 0 0 0  PHQ-9 Score 13 17 - 9  Difficult doing work/chores Very difficult Extremely dIfficult Somewhat difficult Somewhat difficult      Lab Results  Component Value Date   WBC  6.8 02/03/2020   HGB 14.3 02/03/2020   HCT 43.2 02/03/2020   PLT 182 02/03/2020   GLUCOSE 84 02/03/2020   CHOL 160 08/11/2019   TRIG 74 08/11/2019   HDL 65 08/11/2019   LDLCALC 81 08/11/2019   ALT 9 08/11/2019   AST 17 08/11/2019   NA 140 02/03/2020   K 4.2 02/03/2020   CL 99 02/03/2020   CREATININE 0.80 02/03/2020   BUN 22 02/03/2020   CO2 30 (H) 02/03/2020   INR 1.02 01/28/2015   HGBA1C 5.1 08/11/2019      Assessment & Plan:   1. Atherosclerosis of aorta (HCC) atheroscleosis of aorta found on CT scan chest last year.  2. Chronic obstructive pulmonary disease, unspecified COPD type ([place]) An individualize plan was formulated for care of COPD.  Treatment is evidence based.  She will continue on inhalers, avoid smoking and smoke.  Regular exercise with help with dyspnea. Routine follow ups and medication compliance is needed.  3. Depression, major, single episode, severe ([place]) Patient's depression is partially controlled with buproprion.   Anhedonia better.  PHQ 9 was performed score 13. An individual care plan was established or reinforced today.  The patient's disease status was assessed using clinical findings on exam, labs, and or other diagnostic testing to determine patient's success in meeting treatment goals based on disease specific evidence-based guidelines and found to be improving Recommendations include stay on medicines  4. Type 2 diabetes mellitus with other specified complication, without long-term current use of insulin (HCC) - AMB Referral to [place] - Hemoglobin A1c An individual care plan for diabetes was established and reinforced today.  The patient's status was assessed using clinical  findings on exam, labs and diagnostic testing. Patient success at meeting goals based on disease specific evidence-based guidelines and found to be good controlled. Medications were assessed and patient's understanding of the medical issues , including  barriers were assessed. Recommend adherence to a diabetic diet, a graduated exercise program, HgbA1c level is checked quarterly, and urine microalbumin performed yearly .  Annual mono-filament sensation testing performed. Lower blood pressure and control hyperlipidemia is important. Get annual eye exams and annual flu shots and smoking cessation discussed.  Self management goals were discussed. stable 5. PAT (paroxysmal atrial tachycardia) ([place]) Patient has a diagnosis of paroxysmal atrial fibrillation.   Patient is on none and has controlled ventricular response.  Patient is CV stable.  6. Essential hypertension - AMB Referral to [facility] - CBC with Differential/Platelet - Comprehensive metabolic panel - cloNIDine (CATAPRES) 0.1 MG tablet; Take 1 tablet (0.1 mg total) by mouth 2 (two) times daily.  Dispense: 180 tablet; Refill: 3 An individual hypertension care plan was established and reinforced today.  The patient's status was assessed using clinical findings on exam and labs or diagnostic tests. The patient's success at meeting treatment goals on disease specific evidence-based guidelines and found to be well controlled. SELF MANAGEMENT: The patient and I together assessed ways to personally work towards obtaining the recommended goals. RECOMMENDATIONS: avoid decongestants found in common cold remedies, decrease consumption of alcohol, perform routine monitoring of BP with home BP cuff, exercise, reduction of dietary salt, take medicines as prescribed, try not to miss doses and quit smoking.  Regular exercise and maintaining a healthy weight is needed.  Stress reduction may help. A CLINICAL SUMMARY including written plan identify barriers to care unique to individual due to social or financial issues.  We attempt to mutually creat solutions for individual and family understanding.  7. Mixed hyperlipidemia - Lipid panel AN INDIVIDUAL CARE PLAN for hyperlipidemia/ cholesterol  was established and reinforced today.  The patient's status was assessed using clinical findings on exam, lab and other diagnostic tests. The patient's disease status was assessed based on evidence-based guidelines and found to be well controlled. MEDICATIONS were reviewed. SELF MANAGEMENT GOALS have been discussed and patient's success at attaining the goal of low cholesterol was assessed. RECOMMENDATION given include regular exercise 3 days a week and low cholesterol/low fat diet. CLINICAL SUMMARY including written plan to identify barriers unique to the patient due to social or economic  reasons was discussed.  8. Night sweats Patient having some night sweats, we will follow closely, the CT chest last year was normal    Meds ordered this encounter  Medications  . cloNIDine (CATAPRES) 0.1 MG tablet    Sig: Take 1 tablet (0.1 mg total) by mouth 2 (two) times daily.    Dispense:  180 tablet    Refill:  3    Orders Placed This Encounter  Procedures  . CBC with Differential/Platelet  . Comprehensive metabolic panel  . Hemoglobin A1c  . Lipid panel  . AMB Referral to [facility]      I spent 30 minutes dedicated to the care of this patient on the date of this encounter to include face-to-face time with the patient, as well as: review x-rays  Follow-up: Return in about 3 months (around 10/11/2020) for for AWV.  An After Visit Summary was printed and given to the patient.  [name], MD [facility] [phone]

## 2020-07-15 ENCOUNTER — Telehealth: Payer: Self-pay | Admitting: Legal Medicine

## 2020-07-15 LAB — CBC WITH DIFFERENTIAL/PLATELET
Basophils Absolute: 0.1 10*3/uL (ref 0.0–0.2)
Basos: 1 %
EOS (ABSOLUTE): 0 10*3/uL (ref 0.0–0.4)
Eos: 0 %
Hematocrit: 41.9 % (ref 34.0–46.6)
Hemoglobin: 13.7 g/dL (ref 11.1–15.9)
Immature Grans (Abs): 0 10*3/uL (ref 0.0–0.1)
Immature Granulocytes: 0 %
Lymphocytes Absolute: 1.8 10*3/uL (ref 0.7–3.1)
Lymphs: 29 %
MCH: 30.8 pg (ref 26.6–33.0)
MCHC: 32.7 g/dL (ref 31.5–35.7)
MCV: 94 fL (ref 79–97)
Monocytes Absolute: 0.6 10*3/uL (ref 0.1–0.9)
Monocytes: 10 %
Neutrophils Absolute: 3.7 10*3/uL (ref 1.4–7.0)
Neutrophils: 60 %
Platelets: 162 10*3/uL (ref 150–450)
RBC: 4.45 x10E6/uL (ref 3.77–5.28)
RDW: 13.1 % (ref 11.7–15.4)
WBC: 6.2 10*3/uL (ref 3.4–10.8)

## 2020-07-15 LAB — COMPREHENSIVE METABOLIC PANEL
ALT: 11 IU/L (ref 0–32)
AST: 17 IU/L (ref 0–40)
Albumin/Globulin Ratio: 1.6 (ref 1.2–2.2)
Albumin: 4.1 g/dL (ref 3.7–4.7)
Alkaline Phosphatase: 95 IU/L (ref 44–121)
BUN/Creatinine Ratio: 18 (ref 12–28)
BUN: 12 mg/dL (ref 8–27)
Bilirubin Total: 0.4 mg/dL (ref 0.0–1.2)
CO2: 26 mmol/L (ref 20–29)
Calcium: 9.5 mg/dL (ref 8.7–10.3)
Chloride: 100 mmol/L (ref 96–106)
Creatinine, Ser: 0.68 mg/dL (ref 0.57–1.00)
GFR calc Af Amer: 100 mL/min/{1.73_m2} (ref >59)
GFR calc non Af Amer: 86 mL/min/{1.73_m2} (ref >59)
Globulin, Total: 2.5 g/dL (ref 1.5–4.5)
Glucose: 80 mg/dL (ref 65–99)
Potassium: 4.4 mmol/L (ref 3.5–5.2)
Sodium: 139 mmol/L (ref 134–144)
Total Protein: 6.6 g/dL (ref 6.0–8.5)

## 2020-07-15 LAB — CARDIOVASCULAR RISK ASSESSMENT

## 2020-07-15 LAB — LIPID PANEL
Chol/HDL Ratio: 2.9 ratio (ref 0.0–4.4)
Cholesterol, Total: 167 mg/dL (ref 100–199)
HDL: 57 mg/dL (ref >39)
LDL Chol Calc (NIH): 100 mg/dL — ABNORMAL HIGH (ref 0–99)
Triglycerides: 51 mg/dL (ref 0–149)
VLDL Cholesterol Cal: 10 mg/dL (ref 5–40)

## 2020-07-15 LAB — HEMOGLOBIN A1C
Est. average glucose Bld gHb Est-mCnc: 100 mg/dL
Hgb A1c MFr Bld: 5.1 % (ref 4.8–5.6)

## 2020-07-15 NOTE — Progress Notes (Signed)
°  Chronic Care Management   Outreach Note  07/15/2020 Name: [name] [name] MRN: [id number] DOB: 15-Jul-1945  Referred by: Lillard Anes, MD Reason for referral : Chronic Care Management   An unsuccessful telephone outreach was attempted today. The patient was referred to the pharmacist for assistance with care management and care coordination.   Follow Up Plan:   Hilario Quarry  Upstream Scheduler

## 2020-07-15 NOTE — Progress Notes (Signed)
CBC normal, kidney and liver tests normal, A1c 5.1 OK, LDL cholesterol 100 lp

## 2020-07-16 ENCOUNTER — Other Ambulatory Visit: Payer: Self-pay | Admitting: Legal Medicine

## 2020-07-16 DIAGNOSIS — J432 Centrilobular emphysema: Secondary | ICD-10-CM

## 2020-07-20 ENCOUNTER — Telehealth: Payer: Self-pay | Admitting: Legal Medicine

## 2020-07-20 NOTE — Chronic Care Management (AMB) (Signed)
°  Chronic Care Management   Note  07/20/2020 Name: RENESHIA ZUCCARO MRN: [id number] DOB: 08/01/1945  Nikiyah Fackler Loge is a 75 y.o. year old female who is a primary care patient of Lillard Anes, MD. I reached out to Connye Burkitt Macke by phone today in response to a referral sent by Ms. Connye Burkitt Wold's PCP, Lillard Anes, MD.   Ms. Jeschke was given information about Chronic Care Management services today including:  1. CCM service includes personalized support from designated clinical staff supervised by her physician, including individualized plan of care and coordination with other care providers 2. 24/7 contact phone numbers for assistance for urgent and routine care needs. 3. Service will only be billed when office clinical staff spend 20 minutes or more in a month to coordinate care. 4. Only one practitioner may furnish and bill the service in a calendar month. 5. The patient may stop CCM services at any time (effective at the end of the month) by phone call to the office staff.   Patient agreed to services and verbal consent obtained.   Follow up plan:   [place]

## 2020-07-22 ENCOUNTER — Emergency Department (HOSPITAL_COMMUNITY): Payer: Medicare Other

## 2020-07-22 ENCOUNTER — Observation Stay (HOSPITAL_COMMUNITY)
Admission: EM | Admit: 2020-07-22 | Discharge: 2020-07-23 | Disposition: A | Discharge status: Home / Self Care | Payer: Medicare Other | Attending: Internal Medicine | Admitting: Internal Medicine

## 2020-07-22 ENCOUNTER — Encounter (HOSPITAL_COMMUNITY): Payer: Self-pay | Admitting: Emergency Medicine

## 2020-07-22 DIAGNOSIS — R55 Syncope and collapse: Secondary | ICD-10-CM

## 2020-07-22 DIAGNOSIS — R0602 Shortness of breath: Secondary | ICD-10-CM | POA: Insufficient documentation

## 2020-07-22 DIAGNOSIS — R110 Nausea: Secondary | ICD-10-CM | POA: Diagnosis not present

## 2020-07-22 DIAGNOSIS — R0902 Hypoxemia: Secondary | ICD-10-CM | POA: Diagnosis not present

## 2020-07-22 DIAGNOSIS — Z20822 Contact with and (suspected) exposure to covid-19: Secondary | ICD-10-CM | POA: Diagnosis not present

## 2020-07-22 DIAGNOSIS — F17210 Nicotine dependence, cigarettes, uncomplicated: Secondary | ICD-10-CM | POA: Insufficient documentation

## 2020-07-22 DIAGNOSIS — J440 Chronic obstructive pulmonary disease with acute lower respiratory infection: Principal | ICD-10-CM | POA: Insufficient documentation

## 2020-07-22 DIAGNOSIS — R079 Chest pain, unspecified: Secondary | ICD-10-CM | POA: Diagnosis present

## 2020-07-22 DIAGNOSIS — E119 Type 2 diabetes mellitus without complications: Secondary | ICD-10-CM | POA: Diagnosis not present

## 2020-07-22 DIAGNOSIS — K449 Diaphragmatic hernia without obstruction or gangrene: Secondary | ICD-10-CM | POA: Diagnosis not present

## 2020-07-22 DIAGNOSIS — R42 Dizziness and giddiness: Secondary | ICD-10-CM | POA: Diagnosis not present

## 2020-07-22 DIAGNOSIS — I2510 Atherosclerotic heart disease of native coronary artery without angina pectoris: Secondary | ICD-10-CM | POA: Diagnosis not present

## 2020-07-22 DIAGNOSIS — N200 Calculus of kidney: Secondary | ICD-10-CM | POA: Diagnosis not present

## 2020-07-22 DIAGNOSIS — I10 Essential (primary) hypertension: Secondary | ICD-10-CM | POA: Diagnosis not present

## 2020-07-22 DIAGNOSIS — J209 Acute bronchitis, unspecified: Secondary | ICD-10-CM | POA: Diagnosis not present

## 2020-07-22 DIAGNOSIS — E278 Other specified disorders of adrenal gland: Secondary | ICD-10-CM | POA: Diagnosis not present

## 2020-07-22 DIAGNOSIS — R519 Headache, unspecified: Secondary | ICD-10-CM | POA: Insufficient documentation

## 2020-07-22 DIAGNOSIS — Z743 Need for continuous supervision: Secondary | ICD-10-CM | POA: Diagnosis not present

## 2020-07-22 DIAGNOSIS — Z96652 Presence of left artificial knee joint: Secondary | ICD-10-CM | POA: Diagnosis not present

## 2020-07-22 DIAGNOSIS — R6889 Other general symptoms and signs: Secondary | ICD-10-CM | POA: Diagnosis not present

## 2020-07-22 DIAGNOSIS — J449 Chronic obstructive pulmonary disease, unspecified: Secondary | ICD-10-CM | POA: Diagnosis not present

## 2020-07-22 DIAGNOSIS — F322 Major depressive disorder, single episode, severe without psychotic features: Secondary | ICD-10-CM

## 2020-07-22 DIAGNOSIS — Z79899 Other long term (current) drug therapy: Secondary | ICD-10-CM | POA: Insufficient documentation

## 2020-07-22 DIAGNOSIS — M47814 Spondylosis without myelopathy or radiculopathy, thoracic region: Secondary | ICD-10-CM | POA: Diagnosis not present

## 2020-07-22 DIAGNOSIS — R61 Generalized hyperhidrosis: Secondary | ICD-10-CM | POA: Diagnosis not present

## 2020-07-22 LAB — URINALYSIS, ROUTINE W REFLEX MICROSCOPIC
Bilirubin Urine: NEGATIVE
Glucose, UA: NEGATIVE mg/dL
Hgb urine dipstick: NEGATIVE
Ketones, ur: NEGATIVE mg/dL
Leukocytes,Ua: NEGATIVE
Nitrite: NEGATIVE
Protein, ur: NEGATIVE mg/dL
Specific Gravity, Urine: 1.033 — ABNORMAL HIGH (ref 1.005–1.030)
pH: 6 (ref 5.0–8.0)

## 2020-07-22 LAB — I-STAT CHEM 8, ED
BUN: 26 mg/dL — ABNORMAL HIGH (ref 8–23)
Calcium, Ion: 1.27 mmol/L (ref 1.15–1.40)
Chloride: 99 mmol/L (ref 98–111)
Creatinine, Ser: 0.8 mg/dL (ref 0.44–1.00)
Glucose, Bld: 103 mg/dL — ABNORMAL HIGH (ref 70–99)
HCT: 40 % (ref 36.0–46.0)
Hemoglobin: 13.6 g/dL (ref 12.0–15.0)
Potassium: 3.7 mmol/L (ref 3.5–5.1)
Sodium: 137 mmol/L (ref 135–145)
TCO2: 26 mmol/L (ref 22–32)

## 2020-07-22 LAB — BLOOD GAS, VENOUS
Acid-Base Excess: 4.8 mmol/L — ABNORMAL HIGH (ref 0.0–2.0)
Bicarbonate: 29.5 mmol/L — ABNORMAL HIGH (ref 20.0–28.0)
Drawn by: 6041
FIO2: 21
O2 Saturation: 79.8 %
Patient temperature: 37
pCO2, Ven: 50.6 mmHg (ref 44.0–60.0)
pH, Ven: 7.384 (ref 7.250–7.430)
pO2, Ven: 46.2 mmHg — ABNORMAL HIGH (ref 32.0–45.0)

## 2020-07-22 LAB — COMPREHENSIVE METABOLIC PANEL
ALT: 13 U/L (ref 0–44)
AST: 16 U/L (ref 15–41)
Albumin: 3.6 g/dL (ref 3.5–5.0)
Alkaline Phosphatase: 64 U/L (ref 38–126)
Anion gap: 10 (ref 5–15)
BUN: 23 mg/dL (ref 8–23)
CO2: 24 mmol/L (ref 22–32)
Calcium: 9.2 mg/dL (ref 8.9–10.3)
Chloride: 101 mmol/L (ref 98–111)
Creatinine, Ser: 0.83 mg/dL (ref 0.44–1.00)
GFR, Estimated: >60 mL/min (ref >60)
Glucose, Bld: 112 mg/dL — ABNORMAL HIGH (ref 70–99)
Potassium: 3.8 mmol/L (ref 3.5–5.1)
Sodium: 135 mmol/L (ref 135–145)
Total Bilirubin: 0.6 mg/dL (ref 0.3–1.2)
Total Protein: 6.2 g/dL — ABNORMAL LOW (ref 6.5–8.1)

## 2020-07-22 LAB — I-STAT VENOUS BLOOD GAS, ED
Acid-base deficit: 1 mmol/L (ref 0.0–2.0)
Bicarbonate: 27.7 mmol/L (ref 20.0–28.0)
Calcium, Ion: 1.19 mmol/L (ref 1.15–1.40)
HCT: 39 % (ref 36.0–46.0)
Hemoglobin: 13.3 g/dL (ref 12.0–15.0)
O2 Saturation: 99 %
Potassium: 3.6 mmol/L (ref 3.5–5.1)
Sodium: 137 mmol/L (ref 135–145)
TCO2: 30 mmol/L (ref 22–32)
pCO2, Ven: 61.4 mmHg — ABNORMAL HIGH (ref 44.0–60.0)
pH, Ven: 7.263 (ref 7.250–7.430)
pO2, Ven: 153 mmHg — ABNORMAL HIGH (ref 32.0–45.0)

## 2020-07-22 LAB — TROPONIN I (HIGH SENSITIVITY)
Troponin I (High Sensitivity): 4 ng/L (ref <18)
Troponin I (High Sensitivity): 5 ng/L (ref <18)

## 2020-07-22 LAB — D-DIMER, QUANTITATIVE: D-Dimer, Quant: 4.45 ug/mL-FEU — ABNORMAL HIGH (ref 0.00–0.50)

## 2020-07-22 LAB — BRAIN NATRIURETIC PEPTIDE: B Natriuretic Peptide: 19.7 pg/mL (ref 0.0–100.0)

## 2020-07-22 LAB — SARS CORONAVIRUS 2 (TAT 6-24 HRS): SARS Coronavirus 2: NEGATIVE

## 2020-07-22 MED ORDER — FLUTICASONE FUROATE-VILANTEROL 200-25 MCG/INH IN AEPB
1.0000 | INHALATION_SPRAY | Freq: Every day | RESPIRATORY_TRACT | Status: DC
  Filled 2020-07-22 (×2): qty 28

## 2020-07-22 MED ORDER — DIAZEPAM 5 MG PO TABS: 5.0000 mg | ORAL_TABLET | Freq: Two times a day (BID) | ORAL | Status: DC | PRN

## 2020-07-22 MED ORDER — ALBUTEROL SULFATE (2.5 MG/3ML) 0.083% IN NEBU: 2.5000 mg | INHALATION_SOLUTION | Freq: Four times a day (QID) | RESPIRATORY_TRACT | Status: DC | PRN

## 2020-07-22 MED ORDER — HYDROCHLOROTHIAZIDE 25 MG PO TABS
25.0000 mg | ORAL_TABLET | Freq: Every day | ORAL | Status: DC
  Administered 2020-07-23: 25 mg via ORAL
  Filled 2020-07-22: qty 1

## 2020-07-22 MED ORDER — IOHEXOL 350 MG/ML SOLN
100.0000 mL | Freq: Once | INTRAVENOUS | Status: AC | PRN
  Administered 2020-07-22: 100 mL via INTRAVENOUS

## 2020-07-22 MED ORDER — PANTOPRAZOLE SODIUM 40 MG PO TBEC: 40.0000 mg | DELAYED_RELEASE_TABLET | Freq: Every day | ORAL | Status: DC

## 2020-07-22 MED ORDER — LISINOPRIL-HYDROCHLOROTHIAZIDE 20-12.5 MG PO TABS: 2.0000 | ORAL_TABLET | Freq: Every day | ORAL | Status: DC

## 2020-07-22 MED ORDER — PROMETHAZINE HCL 25 MG PO TABS: 25.0000 mg | ORAL_TABLET | Freq: Four times a day (QID) | ORAL | Status: DC | PRN

## 2020-07-22 MED ORDER — OXYCODONE-ACETAMINOPHEN 10-325 MG PO TABS: 1.0000 | ORAL_TABLET | Freq: Four times a day (QID) | ORAL | Status: DC | PRN

## 2020-07-22 MED ORDER — ONDANSETRON HCL 4 MG/2ML IJ SOLN: 4.0000 mg | Freq: Four times a day (QID) | INTRAMUSCULAR | Status: DC | PRN

## 2020-07-22 MED ORDER — UMECLIDINIUM BROMIDE 62.5 MCG/INH IN AEPB
1.0000 | INHALATION_SPRAY | Freq: Every day | RESPIRATORY_TRACT | Status: DC
  Filled 2020-07-22 (×2): qty 7

## 2020-07-22 MED ORDER — NICOTINE 14 MG/24HR TD PT24
14.0000 mg | MEDICATED_PATCH | Freq: Every day | TRANSDERMAL | Status: DC
  Administered 2020-07-22 – 2020-07-23 (×2): 14 mg via TRANSDERMAL
  Filled 2020-07-22 (×2): qty 1

## 2020-07-22 MED ORDER — LISINOPRIL 20 MG PO TABS
40.0000 mg | ORAL_TABLET | Freq: Every day | ORAL | Status: DC
  Administered 2020-07-23: 40 mg via ORAL
  Filled 2020-07-22: qty 2

## 2020-07-22 MED ORDER — PREDNISONE 20 MG PO TABS
40.0000 mg | ORAL_TABLET | Freq: Every day | ORAL | Status: DC
  Administered 2020-07-22: 40 mg via ORAL
  Filled 2020-07-22 (×2): qty 2

## 2020-07-22 MED ORDER — TIOTROPIUM BROMIDE MONOHYDRATE 18 MCG IN CAPS: 18.0000 ug | ORAL_CAPSULE | Freq: Every day | RESPIRATORY_TRACT | Status: DC

## 2020-07-22 MED ORDER — OXYCODONE-ACETAMINOPHEN 5-325 MG PO TABS
1.0000 | ORAL_TABLET | Freq: Four times a day (QID) | ORAL | Status: DC | PRN
Start: 2020-07-22 — End: 2020-07-23
  Administered 2020-07-22 – 2020-07-23 (×3): 1 via ORAL
  Filled 2020-07-22 (×3): qty 1

## 2020-07-22 MED ORDER — ENOXAPARIN SODIUM 40 MG/0.4ML ~~LOC~~ SOLN
40.0000 mg | SUBCUTANEOUS | Status: DC
  Administered 2020-07-22: 40 mg via SUBCUTANEOUS
  Filled 2020-07-22: qty 0.4

## 2020-07-22 MED ORDER — CLONIDINE HCL 0.1 MG PO TABS
0.1000 mg | ORAL_TABLET | Freq: Two times a day (BID) | ORAL | Status: DC
  Administered 2020-07-22 – 2020-07-23 (×2): 0.1 mg via ORAL
  Filled 2020-07-22 (×2): qty 1

## 2020-07-22 MED ORDER — ACETAMINOPHEN 325 MG PO TABS: 650.0000 mg | ORAL_TABLET | ORAL | Status: DC | PRN

## 2020-07-22 MED ORDER — LACTATED RINGERS IV BOLUS
500.0000 mL | Freq: Once | INTRAVENOUS | Status: AC
  Administered 2020-07-22: 500 mL via INTRAVENOUS

## 2020-07-22 MED ORDER — ALBUTEROL SULFATE HFA 108 (90 BASE) MCG/ACT IN AERS
2.0000 | INHALATION_SPRAY | Freq: Four times a day (QID) | RESPIRATORY_TRACT | Status: DC | PRN
  Filled 2020-07-22: qty 6.7

## 2020-07-22 MED ORDER — TRAZODONE HCL 50 MG PO TABS
100.0000 mg | ORAL_TABLET | Freq: Every day | ORAL | Status: DC
Start: 2020-07-22 — End: 2020-07-23
  Administered 2020-07-22: 100 mg via ORAL
  Filled 2020-07-22: qty 2

## 2020-07-22 MED ORDER — BUPROPION HCL ER (XL) 150 MG PO TB24
150.0000 mg | ORAL_TABLET | Freq: Every day | ORAL | Status: DC
  Administered 2020-07-23: 150 mg via ORAL
  Filled 2020-07-22: qty 1

## 2020-07-22 MED ORDER — OXYCODONE HCL 5 MG PO TABS: 5.0000 mg | ORAL_TABLET | Freq: Four times a day (QID) | ORAL | Status: DC | PRN

## 2020-07-22 NOTE — ED Provider Notes (Signed)
75 year old with episode of chest pain/tightness as well as syncopal versus near syncope episode.  COPD, diabetes, paroxysmal atrial tachycardia, hypertension, last echo April 2021 with mild diastolic dysfunction, mitral mild regurgitation.  Received signout from Dr. Ron Parker, follow-up CT scan, anticipate admission for episodes of chest pain and near syncope given her cardiac and pulmonary history.  Recheck patient, vitals are stable and patient is well-appearing, CT chest imaging and negative, will consult medicine for admission   [name] Starch, MD 07/22/20 [phone]

## 2020-07-22 NOTE — H&P (Addendum)
History and Physical    [place] [name] [name]:[id number] DOB: [birth year] DOA: 07/22/2020  PCP: Lillard Anes, MD (Confirm with patient/family/NH records and if not entered, this has to be entered at [facility] point of entry) Patient coming from: Home  I have personally briefly reviewed patient's old medical records in [place]  Chief Complaint: Chest pain  HPI: [name] [name] is a 75 y.o. female with medical history significant of COPD, HTN, aortic aneurysm, current smoker, chronic pain, chronic ambulation dysfunction on roller walker, presented with chest pain.  Patient was resting, suddenly started to feel epigastric cramping pain then developed tannates-like chest pain lasted for about 6 minutes, localized, with shortness of breath, no exacerbation or relieving factors.  Then patient started to feel "unable to breath deeply" and then started to feel generalized weakness unable to stand on her feet, lightheadedness and nausea, and vomited stomach content x1.  Friend called EMS, EMS arrived and found patient diaphoretic, pale looking.  Baseline, COPD poorly controlled, uses rescue pump 2 times a day.  No home O2.  Continue to smoke half a pack a day.  ED Course: Troponin negative x1, EKG no acute ST changes.  CT angiogram negative for PE or dissection.  Review of Systems: As per HPI otherwise 14 point review of systems negative.    Past Medical History:  Diagnosis Date  . Chronic left shoulder pain 09/27/2015  . COPD (chronic obstructive pulmonary disease) ([place])   . Enlarged aorta ([place])   . Hyperlipidemia   . Osteoarthritis   . Pain of right hip joint 11/22/2015  . Primary osteoarthritis of left knee 02/04/2015  . S/P total knee arthroplasty 02/15/2015  . SOB (shortness of breath)     Past Surgical History:  Procedure Laterality Date  . ABDOMINAL HYSTERECTOMY    . BREAST BIOPSY Right   . CHOLECYSTECTOMY    . COLONOSCOPY  09/04/2011   Colonic polyps, status post polyectomy.  Incidental small ascending colon lipoma  . ESOPHAGOGASTRODUODENOSCOPY  09/08/2015   Schatzki ring status post esophageal dilitation. Small hiatal hernia  . hemorrhoid surgery    . KNEE SURGERY     left  . TOTAL KNEE ARTHROPLASTY Left 02/15/2015   Procedure: TOTAL KNEE ARTHROPLASTY;  Surgeon: Vickey Huger, MD;  Location: [facility];  Service: Orthopedics;  Laterality: Left;  . TUBAL LIGATION    . WRIST SURGERY     right     reports that she has been smoking cigarettes. She has a 70.50 pack-year smoking history. She has never used smokeless tobacco. She reports that she does not drink alcohol and does not use drugs.  Allergies  Allergen Reactions  . Meperidine Anaphylaxis and Other (See Comments)    Blood pressure dropped, also  . Meperidine Hcl Anaphylaxis and Other (See Comments)    B/P dropped, also  . Influenza Vaccines Other (See Comments)    Flu-like symptoms  . Other Rash and Other (See Comments)    asprage    Family History  Problem Relation Age of Onset  . Heart disease Mother   . Cancer Father        unknown  . Diabetes Father   . Heart disease Father   . Stroke Father   . Lupus Sister   . Seizures Son   . COPD Son      Prior to Admission medications   Medication Sig Start Date End Date Taking? Authorizing Provider  albuterol (PROVENTIL) (2.5 MG/3ML) 0.083% nebulizer solution Take 3 mLs (  2.5 mg total) by nebulization every 6 (six) hours as needed for wheezing or shortness of breath. 10/07/19  Yes Spero Geralds, MD  albuterol (VENTOLIN HFA) 108 (90 Base) MCG/ACT inhaler INHALE 2 PUFFS INTO THE LUNGS EVERY 6 HOURS AS NEEDED FOR WHEEZING OR SHORTNESS OF BREATH Patient taking differently: Inhale 2 puffs into the lungs every 6 (six) hours as needed for wheezing or shortness of breath. 07/16/20  Yes Lillard Anes, MD  Budeson-Glycopyrrol-Formoterol 160-9-4.8 MCG/ACT AERO Inhale 2 puffs into the lungs in the morning and at bedtime. 08/07/19  Yes Lillard Anes,  MD  buPROPion (WELLBUTRIN XL) 150 MG 24 hr tablet Take 1 tablet (150 mg total) by mouth daily. 03/30/20  Yes Lillard Anes, MD  cloNIDine (CATAPRES) 0.1 MG tablet Take 1 tablet (0.1 mg total) by mouth 2 (two) times daily. 07/14/20  Yes Lillard Anes, MD  diazepam (VALIUM) 5 MG tablet Take 1 tablet (5 mg total) by mouth every 12 (twelve) hours as needed for anxiety. Watch for sedation 03/30/20  Yes Lillard Anes, MD  ibuprofen (ADVIL) 200 MG tablet Take 200-400 mg by mouth every 6 (six) hours as needed for mild pain (or headache).   Yes [provider]  lisinopril-hydrochlorothiazide (ZESTORETIC) 20-12.5 MG tablet Take 2 tablets by mouth daily. 04/05/20  Yes [provider]  nitroGLYCERIN (NITROSTAT) 0.4 MG SL tablet Place 1 tablet (0.4 mg total) under the tongue every 5 (five) minutes as needed for chest pain. 08/19/19 03/30/20 Yes Tobb, Kardie, DO  omeprazole (PRILOSEC) 40 MG capsule Take 1 capsule (40 mg total) by mouth daily. 04/15/20  Yes Lillard Anes, MD  oxyCODONE-acetaminophen (PERCOCET) 10-325 MG tablet Take 1 tablet by mouth every 6 (six) hours as needed for pain. 07/23/19  Yes [provider]  promethazine (PHENERGAN) 25 MG tablet Take 1 tablet (25 mg total) by mouth every 6 (six) hours as needed for nausea or vomiting. 03/30/20  Yes Lillard Anes, MD  traZODone (DESYREL) 100 MG tablet TAKE 2 TABLETS(200 MG) BY MOUTH AT BEDTIME Patient taking differently: Take 100 mg by mouth at bedtime. 07/08/20  Yes Lillard Anes, MD  Vitamin D, Ergocalciferol, (DRISDOL) 1.25 MG (50000 UNIT) CAPS capsule Take 1 capsule (50,000 Units total) by mouth every 7 (seven) days. 12/11/19  Yes Tobb, Kardie, DO  loperamide (IMODIUM) 2 MG capsule Take by mouth. 03/20/20   [provider]  potassium chloride SA (KLOR-CON) 20 MEQ tablet Take 1 tablet (20 mEq total) by mouth daily. Patient not taking: Reported on 07/22/2020 12/10/19   Berniece Salines, DO    Physical Exam: Vitals:   07/22/20 1715 07/22/20 1730 07/22/20 1745 07/22/20 1815  BP: (!) 151/66 132/73 129/73 120/76  Pulse:   62 71  Resp: 14 15 (!) 21 (!) 21  Temp:      TempSrc:      SpO2:   100% 99%  Weight:      Height:        Constitutional: NAD, calm, comfortable Vitals:   07/22/20 1715 07/22/20 1730 07/22/20 1745 07/22/20 1815  BP: (!) 151/66 132/73 129/73 120/76  Pulse:   62 71  Resp: 14 15 (!) 21 (!) 21  Temp:      TempSrc:      SpO2:   100% 99%  Weight:      Height:       Eyes: PERRL, lids and conjunctivae normal ENMT: Mucous membranes are moist. Posterior pharynx clear of any exudate  or lesions.Normal dentition.  Neck: normal, supple, no masses, no thyromegaly Respiratory: Diminished bilaterally no crackles. Increasing respiratory effort. No accessory muscle use.  Cardiovascular: Regular rate and rhythm, no murmurs / rubs / gallops. No extremity edema. 2+ pedal pulses. No carotid bruits.  Abdomen: no tenderness, no masses palpated. No hepatosplenomegaly. Bowel sounds positive.  Musculoskeletal: no clubbing / cyanosis. No joint deformity upper and lower extremities. Good ROM, no contractures. Normal muscle tone.  Skin: no rashes, lesions, ulcers. No induration Neurologic: CN 2-12 grossly intact. Sensation intact, DTR normal. Strength 5/5 in all 4.  Psychiatric: Normal judgment and insight. Alert and oriented x 3. Normal mood.     Labs on Admission: I have personally reviewed following labs and imaging studies  CBC: Recent Labs  Lab 07/22/20 1502  HGB 13.6  HCT 96.7   Basic Metabolic Panel: Recent Labs  Lab 07/22/20 1244 07/22/20 1502  NA 135 137  K 3.8 3.7  CL 101 99  CO2 24  --   GLUCOSE 112* 103*  BUN 23 26*  CREATININE 0.83 0.80  CALCIUM 9.2  --    GFR: Estimated Creatinine Clearance: 58.9 mL/min (by C-G formula based on SCr of 0.8 mg/dL). Liver Function Tests: Recent Labs  Lab 07/22/20 1244  AST 16  ALT 13  ALKPHOS  64  BILITOT 0.6  PROT 6.2*  ALBUMIN 3.6   No results for input(s): LIPASE, AMYLASE in the last 168 hours. No results for input(s): AMMONIA in the last 168 hours. Coagulation Profile: No results for input(s): INR, PROTIME in the last 168 hours. Cardiac Enzymes: No results for input(s): CKTOTAL, CKMB, CKMBINDEX, TROPONINI in the last 168 hours. BNP (last 3 results) No results for input(s): PROBNP in the last 8760 hours. HbA1C: No results for input(s): HGBA1C in the last 72 hours. CBG: No results for input(s): GLUCAP in the last 168 hours. Lipid Profile: No results for input(s): CHOL, HDL, LDLCALC, TRIG, CHOLHDL, LDLDIRECT in the last 72 hours. Thyroid Function Tests: No results for input(s): TSH, T4TOTAL, FREET4, T3FREE, THYROIDAB in the last 72 hours. Anemia Panel: No results for input(s): VITAMINB12, FOLATE, FERRITIN, TIBC, IRON, RETICCTPCT in the last 72 hours. Urine analysis:    Component Value Date/Time   COLORURINE YELLOW 01/28/2015 1356   APPEARANCEUR CLEAR 01/28/2015 1356   LABSPEC 1.011 01/28/2015 1356   PHURINE 7.0 01/28/2015 1356   GLUCOSEU NEGATIVE 01/28/2015 1356   HGBUR NEGATIVE 01/28/2015 [place] 01/28/2015 1356   [place] 01/28/2015 1356   PROTEINUR NEGATIVE 01/28/2015 1356   UROBILINOGEN 1.0 01/28/2015 1356   NITRITE NEGATIVE 01/28/2015 1356   LEUKOCYTESUR NEGATIVE 01/28/2015 1356    Radiological Exams on Admission: CT Head Wo Contrast  Result Date: 07/22/2020 CLINICAL DATA:  Headache.  Worsening headache. EXAM: CT HEAD WITHOUT CONTRAST TECHNIQUE: Contiguous axial images were obtained from the base of the skull through the vertex without intravenous contrast. COMPARISON:  Brain MRI 04/14/2016 FINDINGS: Brain: No acute intracranial hemorrhage. No focal mass lesion. No CT evidence of acute infarction. No midline shift or mass effect. No hydrocephalus. Basilar cisterns are patent. Small calcific density along the LEFT side of the  anterior interhemispheric falx measuring 8 mm by 5 mm (image 26/2) is not changed from meningioma described on comparison MRI. Vascular: No hyperdense vessel or unexpected calcification. Skull: Normal. Negative for fracture or focal lesion. Sinuses/Orbits: Paranasal sinuses and mastoid air cells are clear. Orbits are clear. Other: None. IMPRESSION: No acute intracranial findings Mild atrophy and white matter  microvascular disease Electronically Signed   By: Suzy Bouchard M.D.   On: 07/22/2020 13:26   DG Chest Portable 1 View  Result Date: 07/22/2020 CLINICAL DATA:  Dizziness and syncope today, history COPD, enlarged aorta EXAM: PORTABLE CHEST 1 VIEW COMPARISON:  Portable exam 1313 hours compared to 04/16/2019 FINDINGS: Upper normal heart size. Mediastinal contours and pulmonary vascularity normal. Atherosclerotic calcification aorta. Lungs clear. No acute infiltrate, pleural effusion, or pneumothorax. Bones demineralized. IMPRESSION: No acute abnormalities. Aortic Atherosclerosis (ICD10-I70.0). Electronically Signed   By: Lavonia Dana M.D.   On: 07/22/2020 13:41   CT Angio Chest/Abd/Pel for Dissection W and/or Wo Contrast  Result Date: 07/22/2020 CLINICAL DATA:  Syncope.  Concern for aortic aneurysm EXAM: CT ANGIOGRAPHY CHEST, ABDOMEN AND PELVIS TECHNIQUE: Non-contrast CT of the chest was initially obtained. Multidetector CT imaging through the chest, abdomen and pelvis was performed using the standard protocol during bolus administration of intravenous contrast. Multiplanar reconstructed images and MIPs were obtained and reviewed to evaluate the vascular anatomy. CONTRAST:  114mL OMNIPAQUE IOHEXOL 350 MG/ML SOLN COMPARISON:  12/08/2019, 03/20/2020, 08/15/2013. FINDINGS: CTA CHEST FINDINGS Cardiovascular: Heart size is normal. No pericardial effusion. Noncontrast CT through the chest demonstrates no evidence of thoracic intramural hematoma. No thoracic dissection. Thoracic aorta is normal in caliber  without aneurysm. There is tortuosity of the descending thoracic aorta. 3 vessel arch. Scattered atherosclerotic calcifications of the aorta and coronary arteries. Central pulmonary vasculature appears within normal limits. No central filling defects. Mediastinum/Nodes: No axillary, mediastinal, or hilar lymphadenopathy. Calcified mediastinal and right hilar lymph nodes again noted compatible with chronic granulomatous disease. No thyroid nodule. Trachea within normal limits. Moderate hiatal hernia. Esophagus within normal limits. Lungs/Pleura: 6 mm left upper lobe pulmonary nodule is stable from 2015, benign. No focal airspace consolidation. No pleural effusion. No pneumothorax. Musculoskeletal: Degenerative disc disease of the visualized lower cervical spine. Mild thoracic spondylosis. No acute osseous findings. No chest wall abnormality. Review of the MIP images confirms the above findings. CTA ABDOMEN AND PELVIS FINDINGS VASCULAR Aorta: Normal caliber aorta without aneurysm, dissection, vasculitis or significant stenosis. There is irregular noncalcified and calcified atherosclerotic plaque throughout the abdominal aorta. Celiac: Patent without evidence of aneurysm, dissection, vasculitis or significant stenosis. SMA: Patent without evidence of aneurysm, dissection, vasculitis or significant stenosis. Renals: Both renal arteries are patent without evidence of aneurysm, dissection, vasculitis, fibromuscular dysplasia or significant stenosis. There are 2 right and 3 left renal arteries. IMA: Patent. Inflow: Patent without evidence of aneurysm, dissection, vasculitis or significant stenosis. Veins: No obvious venous abnormality within the limitations of this arterial phase study. Review of the MIP images confirms the above findings. NON-VASCULAR Hepatobiliary: No focal liver abnormality is seen. No gallstones, gallbladder wall thickening, or biliary dilatation. Pancreas: Unremarkable. No pancreatic ductal dilatation  or surrounding inflammatory changes. Spleen: Normal in size without focal abnormality. Adrenals/Urinary Tract: Nodular appearance of the bilateral adrenal glands is unchanged. Bilateral nonobstructing renal calculi, similar to prior. Symmetric enhancement of the bilateral kidneys. No hydronephrosis. Urinary bladder within normal limits. Stomach/Bowel: Moderate hiatal hernia. Stomach otherwise unremarkable. No dilated loops of small bowel. Sigmoid diverticulosis with additional scattered diverticula throughout the remaining colon. No focal bowel wall thickening or inflammatory changes. Lymphatic: No abdominopelvic lymphadenopathy. Reproductive: Status post hysterectomy. No adnexal masses. Other: No free fluid. No abdominopelvic fluid collection. No pneumoperitoneum. No abdominal wall hernia. Musculoskeletal: Advanced multilevel degenerative changes throughout the lumbar spine, similar to prior. No new or acute osseous findings. Review of the MIP images confirms the above findings.  IMPRESSION: 1. No evidence of thoracic or abdominal aortic aneurysm or dissection. 2. No acute findings within the chest, abdomen, or pelvis. 3. Colonic diverticulosis without evidence of acute diverticulitis. 4. Moderate hiatal hernia. 5. Bilateral nonobstructing renal calculi. 6. Aortic atherosclerosis (ICD10-I70.0). Electronically Signed   By: Davina Poke D.O.   On: 07/22/2020 16:39    EKG: Independently reviewed.  RVH, chronic RSR morphology  Assessment/Plan Active Problems:   Chest pain   COPD with acute bronchitis ([place])  (please populate well all problems here in Problem List. (For example, if patient is on BP meds at home and you resume or decide to hold them, it is a problem that needs to be her. Same for CAD, COPD, HLD and so on)  Chest pain -Rule out ACS. F/U 2nd trop reading.  Central PE ruled out, dissection ruled out. -Patient had a normal stress test last year, and patient has mild mitral regurgitation on  last year's echo, will repeat one echo, if no significant wall motion abnormalities, expect patient can be discharged home follow-up with her cardiologist as outpatient for further cardiac work-up.  Acute COPD exacerbation -Short course of p.o. steroid -Add Spiriva -Continue S ABA, LABA and inhaled steroid. -Check VBG to rule out CO2 retention  Near syncope -Appears to be vasovagal reaction, probably related to either chest pain or COPD. -Vital signs including heart rate and blood pressure appears to be stable, check orthostatic vital signs -Treat COPD and reevaluate.  HTN -Continue home BP meds  Anxiety/depression -Continue benzo and SSRI.  Cigarette smoking -Nicotine patch  DVT prophylaxis: Lovenox  code Status: Full Code Family Communication: None at bedside Disposition Plan: Expect less than 2 midnight hospital stay. Consults called: None Admission status: Tele obs   Lequita Halt MD [facility] Pager [phone]  07/22/2020, 6:31 PM

## 2020-07-22 NOTE — ED Triage Notes (Signed)
Patient BIB [facility] for syncopal episode. Called by friend per patient request because she felt like she was going to pass out. On EMS arrival, patient had become pale, diaphoretic, and unresponsive. This episode lasted for less than ten minutes per EMS. Patient is alert, oriented, complaining of LUQ abdominal pain related to a hernia and chronic back pain.  18g saline lock in right AC, received 4mg  zofran PTA.

## 2020-07-22 NOTE — ED Provider Notes (Signed)
[facility] EMERGENCY DEPARTMENT Provider Note   CSN: [id number] Arrival date & time: 07/22/20  1221     History Chief Complaint  Patient presents with  . Loss of Consciousness    [name] [name] is a 75 y.o. female.   Loss of Consciousness Episode history:  Single Most recent episode:  Today Timing:  Rare Progression:  Resolved Chronicity:  New Context: inactivity   Witnessed: yes   Relieved by:  Nothing Worsened by:  Nothing Ineffective treatments:  None tried Associated symptoms: chest pain, diaphoresis, dizziness, headaches, nausea and shortness of breath   Associated symptoms: no fever, no focal weakness, no palpitations, no recent surgery and no vomiting        Past Medical History:  Diagnosis Date  . Chronic left shoulder pain 09/27/2015  . COPD (chronic obstructive pulmonary disease) ([place])   . Enlarged aorta ([place])   . Hyperlipidemia   . Osteoarthritis   . Pain of right hip joint 11/22/2015  . Primary osteoarthritis of left knee 02/04/2015  . S/P total knee arthroplasty 02/15/2015  . SOB (shortness of breath)     Patient Active Problem List   Diagnosis Date Noted  . Acute gastroenteritis 03/30/2020  . Depression, major, single episode, severe ([place]) 03/30/2020  . PAC (premature atrial contraction) 02/03/2020  . PAT (paroxysmal atrial tachycardia) ([place]) 02/03/2020  . Encounter for screening for lung cancer 11/12/2019  . Diastolic dysfunction 19/62/2297  . Chest pain 08/19/2019  . SOB (shortness of breath) 08/19/2019  . Obesity (BMI 30-39.9) 08/19/2019  . Tobacco use 08/19/2019  . Type 2 diabetes mellitus with other specified complication ([place]) 98/92/1194  . Hyperlipidemia 08/07/2019  . Essential hypertension 08/07/2019  . COPD (chronic obstructive pulmonary disease) ([place]) 08/07/2019  . GERD with esophagitis 08/07/2019  . Chest pain at rest 08/07/2019  . Degenerative joint disease involving multiple joints 09/28/2017  . Long-term current  use of opiate analgesic 06/25/2017  . Pain of right hip joint 11/22/2015  . Chronic left shoulder pain 09/27/2015  . S/P total knee arthroplasty 02/15/2015  . Primary osteoarthritis of left knee 02/04/2015    Past Surgical History:  Procedure Laterality Date  . ABDOMINAL HYSTERECTOMY    . BREAST BIOPSY Right   . CHOLECYSTECTOMY    . COLONOSCOPY  09/04/2011   Colonic polyps, status post polyectomy. Incidental small ascending colon lipoma  . ESOPHAGOGASTRODUODENOSCOPY  09/08/2015   Schatzki ring status post esophageal dilitation. Small hiatal hernia  . hemorrhoid surgery    . KNEE SURGERY     left  . TOTAL KNEE ARTHROPLASTY Left 02/15/2015   Procedure: TOTAL KNEE ARTHROPLASTY;  Surgeon: Vickey Huger, MD;  Location: [place];  Service: Orthopedics;  Laterality: Left;  . TUBAL LIGATION    . WRIST SURGERY     right     OB History   No obstetric history on file.     Family History  Problem Relation Age of Onset  . Heart disease Mother   . Cancer Father        unknown  . Diabetes Father   . Heart disease Father   . Stroke Father   . Lupus Sister   . Seizures Son   . COPD Son     Social History   Tobacco Use  . Smoking status: Current Every Day Smoker    Packs/day: 1.50    Years: 47.00    Pack years: 70.50    Types: Cigarettes  . Smokeless tobacco: Never Used  .  Tobacco comment: down to 1 pack a day  Vaping Use  . Vaping Use: Never used  Substance Use Topics  . Alcohol use: No  . Drug use: No    Home Medications Prior to Admission medications   Medication Sig Start Date End Date Taking? Authorizing Provider  albuterol (PROVENTIL) (2.5 MG/3ML) 0.083% nebulizer solution Take 3 mLs (2.5 mg total) by nebulization every 6 (six) hours as needed for wheezing or shortness of breath. 10/07/19  Yes Spero Geralds, MD  albuterol (VENTOLIN HFA) 108 (90 Base) MCG/ACT inhaler INHALE 2 PUFFS INTO THE LUNGS EVERY 6 HOURS AS NEEDED FOR WHEEZING OR SHORTNESS OF BREATH Patient  taking differently: Inhale 2 puffs into the lungs every 6 (six) hours as needed for wheezing or shortness of breath. 07/16/20  Yes Lillard Anes, MD  Budeson-Glycopyrrol-Formoterol 160-9-4.8 MCG/ACT AERO Inhale 2 puffs into the lungs in the morning and at bedtime. 08/07/19  Yes Lillard Anes, MD  buPROPion (WELLBUTRIN XL) 150 MG 24 hr tablet Take 1 tablet (150 mg total) by mouth daily. 03/30/20  Yes Lillard Anes, MD  cloNIDine (CATAPRES) 0.1 MG tablet Take 1 tablet (0.1 mg total) by mouth 2 (two) times daily. 07/14/20  Yes Lillard Anes, MD  diazepam (VALIUM) 5 MG tablet Take 1 tablet (5 mg total) by mouth every 12 (twelve) hours as needed for anxiety. Watch for sedation 03/30/20  Yes Lillard Anes, MD  ibuprofen (ADVIL) 200 MG tablet Take 200-400 mg by mouth every 6 (six) hours as needed for mild pain (or headache).   Yes [provider]  lisinopril-hydrochlorothiazide (ZESTORETIC) 20-12.5 MG tablet Take 2 tablets by mouth daily. 04/05/20  Yes [provider]  nitroGLYCERIN (NITROSTAT) 0.4 MG SL tablet Place 1 tablet (0.4 mg total) under the tongue every 5 (five) minutes as needed for chest pain. 08/19/19 03/30/20 Yes Tobb, Kardie, DO  omeprazole (PRILOSEC) 40 MG capsule Take 1 capsule (40 mg total) by mouth daily. 04/15/20  Yes Lillard Anes, MD  oxyCODONE-acetaminophen (PERCOCET) 10-325 MG tablet Take 1 tablet by mouth every 6 (six) hours as needed for pain. 07/23/19  Yes [provider]  promethazine (PHENERGAN) 25 MG tablet Take 1 tablet (25 mg total) by mouth every 6 (six) hours as needed for nausea or vomiting. 03/30/20  Yes Lillard Anes, MD  traZODone (DESYREL) 100 MG tablet TAKE 2 TABLETS(200 MG) BY MOUTH AT BEDTIME Patient taking differently: Take 100 mg by mouth at bedtime. 07/08/20  Yes Lillard Anes, MD  Vitamin D, Ergocalciferol, (DRISDOL) 1.25 MG (50000 UNIT) CAPS capsule Take 1 capsule (50,000 Units  total) by mouth every 7 (seven) days. 12/11/19  Yes Tobb, Kardie, DO  loperamide (IMODIUM) 2 MG capsule Take by mouth. 03/20/20   [provider]  potassium chloride SA (KLOR-CON) 20 MEQ tablet Take 1 tablet (20 mEq total) by mouth daily. Patient not taking: Reported on 07/22/2020 12/10/19   Tobb, Godfrey Pick, DO    Allergies    Meperidine, Meperidine hcl, Influenza vaccines, and Other  Review of Systems   Review of Systems  Constitutional: Positive for diaphoresis. Negative for chills and fever.  HENT: Negative for congestion and rhinorrhea.   Respiratory: Positive for shortness of breath. Negative for cough.   Cardiovascular: Positive for chest pain and syncope. Negative for palpitations.  Gastrointestinal: Positive for nausea. Negative for diarrhea and vomiting.  Genitourinary: Negative for difficulty urinating and dysuria.  Musculoskeletal: Negative for arthralgias and back pain.  Skin: Negative for  rash and wound.  Neurological: Positive for dizziness, syncope and headaches. Negative for focal weakness and light-headedness.    Physical Exam Updated Vital Signs BP (!) 125/91   Pulse 65   Resp 16   SpO2 99%   Physical Exam Vitals and nursing note reviewed. Exam conducted with a chaperone present.  Constitutional:      General: She is not in acute distress.    Appearance: Normal appearance.  HENT:     Head: Normocephalic and atraumatic.     Nose: No rhinorrhea.  Eyes:     General:        Right eye: No discharge.        Left eye: No discharge.     Conjunctiva/sclera: Conjunctivae normal.  Cardiovascular:     Rate and Rhythm: Normal rate and regular rhythm.     Heart sounds: No murmur heard. No gallop.   Pulmonary:     Effort: Pulmonary effort is normal. No respiratory distress.     Breath sounds: No stridor. No wheezing or rales.  Abdominal:     General: Abdomen is flat. There is no distension.     Palpations: Abdomen is soft.     Tenderness: There is no  abdominal tenderness. There is no guarding.  Musculoskeletal:        General: No tenderness or signs of injury.  Skin:    General: Skin is warm and dry.  Neurological:     General: No focal deficit present.     Mental Status: She is alert. Mental status is at baseline.     Motor: No weakness.     Comments: Equal strength in upper and lower extremities, facial features symmetric speech clear.  Pupils reactive to light no nystagmus.  Psychiatric:        Mood and Affect: Mood normal.        Behavior: Behavior normal.     ED Results / Procedures / Treatments   Labs (all labs ordered are listed, but only abnormal results are displayed) Labs Reviewed  COMPREHENSIVE METABOLIC PANEL - Abnormal; Notable for the following components:      Result Value   Glucose, Bld 112 (*)    Total Protein 6.2 (*)    All other components within normal limits  D-DIMER, QUANTITATIVE - Abnormal; Notable for the following components:   D-Dimer, Quant 4.45 (*)    All other components within normal limits  SARS CORONAVIRUS 2 (TAT 6-24 HRS)  BRAIN NATRIURETIC PEPTIDE  URINALYSIS, ROUTINE W REFLEX MICROSCOPIC  I-STAT CHEM 8, ED  TROPONIN I (HIGH SENSITIVITY)  TROPONIN I (HIGH SENSITIVITY)    EKG None  Radiology CT Head Wo Contrast  Result Date: 07/22/2020 CLINICAL DATA:  Headache.  Worsening headache. EXAM: CT HEAD WITHOUT CONTRAST TECHNIQUE: Contiguous axial images were obtained from the base of the skull through the vertex without intravenous contrast. COMPARISON:  Brain MRI 04/14/2016 FINDINGS: Brain: No acute intracranial hemorrhage. No focal mass lesion. No CT evidence of acute infarction. No midline shift or mass effect. No hydrocephalus. Basilar cisterns are patent. Small calcific density along the LEFT side of the anterior interhemispheric falx measuring 8 mm by 5 mm (image 26/2) is not changed from meningioma described on comparison MRI. Vascular: No hyperdense vessel or unexpected calcification.  Skull: Normal. Negative for fracture or focal lesion. Sinuses/Orbits: Paranasal sinuses and mastoid air cells are clear. Orbits are clear. Other: None. IMPRESSION: No acute intracranial findings Mild atrophy and white matter microvascular disease Electronically Signed  By: Suzy Bouchard M.D.   On: 07/22/2020 13:26   DG Chest Portable 1 View  Result Date: 07/22/2020 CLINICAL DATA:  Dizziness and syncope today, history COPD, enlarged aorta EXAM: PORTABLE CHEST 1 VIEW COMPARISON:  Portable exam 1313 hours compared to 04/16/2019 FINDINGS: Upper normal heart size. Mediastinal contours and pulmonary vascularity normal. Atherosclerotic calcification aorta. Lungs clear. No acute infiltrate, pleural effusion, or pneumothorax. Bones demineralized. IMPRESSION: No acute abnormalities. Aortic Atherosclerosis (ICD10-I70.0). Electronically Signed   By: Lavonia Dana M.D.   On: 07/22/2020 13:41    Procedures Procedures   Medications Ordered in ED Medications  lactated ringers bolus 500 mL (has no administration in time range)    ED Course  I have reviewed the triage vital signs and the nursing notes.  Pertinent labs & imaging results that were available during my care of the patient were reviewed by me and considered in my medical decision making (see chart for details).    MDM Rules/Calculators/A&P                          Syncopal episode witnessed, multiple symptoms preceding that sound vasovagal however patient has significant cardiac and pulmonary history.  Patient is also been having headaches.  Dizziness.  Will get CT imaging will get cardiac biomarkers will get EKG chest x-ray.  Screening laboratory studies.  Review of echocardiogram done 1 year ago shows a dilated thoracic aneurysm of the aorta.  Will likely get CTA today due to this abnormality is or could be changed causing abnormal perfusion through the carotids.  We will wait for renal functions.  Patient remains hemodynamically stable is  feeling better here.  Laboratory studies show an elevated D-dimer otherwise unremarkable labs.  We will get a CTA to evaluate for abdominal thoracic aorta abnormalities.  Will also be able to evaluate large vessel pulmonary emboli.  She will likely need admission for repeat ultrasound in the setting of syncope and given her history I feel it is safer than outpatient management until proven otherwise.  Pt care was handed off to on coming provider at 1500.  Complete history and physical and current plan have been communicated.  Please refer to their note for the remainder of ED care and ultimate disposition.  Pt seen in conjunction with Dr. Roslynn Amble    Final Clinical Impression(s) / ED Diagnoses Final diagnoses:  Syncope, unspecified syncope type    Rx / DC Orders ED Discharge Orders    None       Breck Coons, MD 07/22/20 1453

## 2020-07-23 ENCOUNTER — Other Ambulatory Visit: Payer: Self-pay

## 2020-07-23 ENCOUNTER — Observation Stay (HOSPITAL_BASED_OUTPATIENT_CLINIC_OR_DEPARTMENT_OTHER): Payer: Medicare Other

## 2020-07-23 ENCOUNTER — Encounter (HOSPITAL_COMMUNITY): Payer: Self-pay | Admitting: Internal Medicine

## 2020-07-23 DIAGNOSIS — Z0000 Encounter for general adult medical examination without abnormal findings: Secondary | ICD-10-CM | POA: Diagnosis not present

## 2020-07-23 DIAGNOSIS — J449 Chronic obstructive pulmonary disease, unspecified: Secondary | ICD-10-CM | POA: Diagnosis not present

## 2020-07-23 DIAGNOSIS — J209 Acute bronchitis, unspecified: Secondary | ICD-10-CM | POA: Diagnosis not present

## 2020-07-23 DIAGNOSIS — R079 Chest pain, unspecified: Secondary | ICD-10-CM

## 2020-07-23 DIAGNOSIS — R2681 Unsteadiness on feet: Secondary | ICD-10-CM | POA: Diagnosis not present

## 2020-07-23 DIAGNOSIS — R531 Weakness: Secondary | ICD-10-CM | POA: Diagnosis not present

## 2020-07-23 DIAGNOSIS — R296 Repeated falls: Secondary | ICD-10-CM | POA: Diagnosis not present

## 2020-07-23 DIAGNOSIS — R55 Syncope and collapse: Secondary | ICD-10-CM | POA: Diagnosis not present

## 2020-07-23 DIAGNOSIS — J440 Chronic obstructive pulmonary disease with acute lower respiratory infection: Secondary | ICD-10-CM | POA: Diagnosis not present

## 2020-07-23 LAB — ECHOCARDIOGRAM COMPLETE
Area-P 1/2: 2.5 cm2
Height: 62 in
S' Lateral: 2.4 cm
Weight: 2716.07 oz

## 2020-07-23 LAB — HIV ANTIBODY (ROUTINE TESTING W REFLEX): HIV Screen 4th Generation wRfx: NONREACTIVE

## 2020-07-23 LAB — MRSA PCR SCREENING: MRSA by PCR: NEGATIVE

## 2020-07-23 MED ORDER — DICLOFENAC SODIUM 1 % EX GEL
2.0000 g | Freq: Four times a day (QID) | CUTANEOUS | Status: DC
  Administered 2020-07-23 (×2): 2 g via TOPICAL
  Filled 2020-07-23: qty 100

## 2020-07-23 MED ORDER — NICOTINE 14 MG/24HR TD PT24: 14.0000 mg | MEDICATED_PATCH | Freq: Every day | TRANSDERMAL | 0 refills | Status: DC

## 2020-07-23 MED ORDER — TRAZODONE HCL 100 MG PO TABS: 100.0000 mg | ORAL_TABLET | Freq: Every day | ORAL | Status: DC

## 2020-07-23 MED ORDER — ADULT MULTIVITAMIN W/MINERALS CH: 1.0000 | ORAL_TABLET | Freq: Every day | ORAL | Status: DC

## 2020-07-23 MED ORDER — DICLOFENAC SODIUM 1 % EX GEL: 2.0000 g | Freq: Four times a day (QID) | CUTANEOUS | 0 refills | Status: DC

## 2020-07-23 MED ORDER — PANTOPRAZOLE SODIUM 40 MG PO TBEC
40.0000 mg | DELAYED_RELEASE_TABLET | Freq: Two times a day (BID) | ORAL | Status: DC
  Administered 2020-07-23: 40 mg via ORAL
  Filled 2020-07-23: qty 1

## 2020-07-23 MED ORDER — OMEPRAZOLE 40 MG PO CPDR: 40.0000 mg | DELAYED_RELEASE_CAPSULE | Freq: Two times a day (BID) | ORAL | 0 refills | Status: DC

## 2020-07-23 MED ORDER — PREDNISONE 20 MG PO TABS: 40.0000 mg | ORAL_TABLET | Freq: Every day | ORAL | 0 refills | Status: DC

## 2020-07-23 MED ORDER — ENSURE ENLIVE PO LIQD
237.0000 mL | Freq: Two times a day (BID) | ORAL | Status: DC
  Administered 2020-07-23: 237 mL via ORAL

## 2020-07-23 NOTE — Evaluation (Signed)
Occupational Therapy Evaluation Patient Details Name: [name] [name] MRN: [id number] DOB: 1946/02/10 Today's Date: 07/23/2020    History of Present Illness 75 yo female admitted to ED on 2/24 with syncopal episode, preceded by chest pain with shortness of breath and epigastric cramping, lasting <10 minutes. Suspect acute COPD exacerbation. CT angiogram negative for PE or aneurysm dissection. PMH includes COPD, HTN, aortic aneurysm, current smoker (1/2 pack a day), chronic pain, L TKR, chronic ambulation dysfunction on roller walker.   Clinical Impression   This 75 yo female admitted with above presents to acute OT with PLOF of living by herself and being Mod I to independent with basic ADLs. Currently she is setup/S-min guard A for basic ADLs. She will benefit from acute OT with followup HHOT and 24 hour S/A initially until she and family feel she is safe to stay by herself.    Follow Up Recommendations  Home health OT;Supervision/Assistance - 24 hour    Equipment Recommendations  None recommended by OT       Precautions / Restrictions Precautions Precautions: Fall Restrictions Weight Bearing Restrictions: No      Mobility Bed Mobility Overal bed mobility: Independent Bed Mobility: Supine to Sit;Sit to Supine                Transfers Overall transfer level: Needs assistance Equipment used: Rolling walker (2 wheeled) Transfers: Sit to/from Stand Sit to Stand: Min guard         General transfer comment: safe hand placement    Balance Overall balance assessment: Needs assistance;History of Falls Sitting-balance support: No upper extremity supported;Feet supported Sitting balance-Leahy Scale: Good     Standing balance support: Bilateral upper extremity supported Standing balance-Leahy Scale: Poor                             ADL either performed or assessed with clinical judgement   ADL Overall ADL's : Needs assistance/impaired Eating/Feeding:  Independent   Grooming: Wash/dry hands;Min guard;Standing   Upper Body Bathing: Set up;Sitting   Lower Body Bathing: Min guard;Sit to/from stand   Upper Body Dressing : Set up;Sitting   Lower Body Dressing: Min guard;Sit to/from stand   Toilet Transfer: Min guard;Ambulation;RW   Toileting- Water quality scientist and Hygiene: Min guard;Sit to/from stand         General ADL Comments: Pt got SOB with just going to toilet and over to sink to wash hands. Edcucated her on purse lipped breathing and she needed VCs to follow through with this. Sats did drop into mid 80's at one point when her SOB was audible but wave form was not good. Recommended she get and use a shower seat due to her DOE/SOB with minimal exertion. We also talked about her not taking hot/steamy showers because this may make her SOB even worse--also to have vent fan running and bathroom door open to allow air circulation.     Vision Patient Visual Report: No change from baseline              Pertinent Vitals/Pain Pain Assessment: No/denies pain     Hand Dominance Right   Extremity/Trunk Assessment Upper Extremity Assessment Upper Extremity Assessment: Generalized weakness           Communication Communication Communication: No difficulties   Cognition Arousal/Alertness: Awake/alert Behavior During Therapy: WFL for tasks assessed/performed Overall Cognitive Status: Within Functional Limits for tasks assessed  Home Living Family/patient expects to be discharged to:: Private residence Living Arrangements: Alone Available Help at Discharge: Family;Available PRN/intermittently;Friend(s);Neighbor Type of Home: Apartment Home Access: Level entry     Home Layout: One level     Bathroom Shower/Tub: Teacher, early years/pre: [place]: Environmental consultant - 2 wheels;Electric scooter;Cane - quad;Grab bars - tub/shower;Grab  bars - toilet          Prior Functioning/Environment Level of Independence: Needs assistance  Gait / Transfers Assistance Needed: pt reports walking with cane or RW for short distances, uses electric scooter to get to convenient store, laundromat ADL's / Homemaking Assistance Needed: Pt reports going with family to the grocery store for grocery shopping; independent with cooking, cleaning, dressing, bathing            OT Problem List: Decreased activity tolerance;Impaired balance (sitting and/or standing);Cardiopulmonary status limiting activity      OT Treatment/Interventions: Self-care/ADL training;Energy conservation;DME and/or AE instruction;Patient/family education;Balance training    OT Goals(Current goals can be found in the care plan section) Acute Rehab OT Goals Patient Stated Goal: to go home. get a rollator OT Goal Formulation: With patient Time For Goal Achievement: 08/06/20 Potential to Achieve Goals: Good  OT Frequency: Min 2X/week              AM-PAC OT "6 Clicks" Daily Activity     Outcome Measure Help from another person eating meals?: None Help from another person taking care of personal grooming?: A Little Help from another person toileting, which includes using toliet, bedpan, or urinal?: A Little Help from another person bathing (including washing, rinsing, drying)?: A Little Help from another person to put on and taking off regular upper body clothing?: A Little Help from another person to put on and taking off regular lower body clothing?: A Little 6 Click Score: 19   End of Session Equipment Utilized During Treatment: Gait belt;Rolling walker  Activity Tolerance:  (limited by DOE/SOB) Patient left: in bed;with call bell/phone within reach;with bed alarm set;with family/visitor present  OT Visit Diagnosis: Unsteadiness on feet (R26.81);Other abnormalities of gait and mobility (R26.89)                Time: 9753-0051 OT Time Calculation (min): 35  min Charges:  OT General Charges $OT Visit: 1 Visit OT Evaluation $OT Eval Moderate Complexity: 1 Mod OT Treatments $Self Care/Home Management : 8-22 mins  [place] [address], OTR/L Acute [employer] Pager [phone] Office [phone]     Almon Register 07/23/2020, 6:25 PM

## 2020-07-23 NOTE — TOC Transition Note (Signed)
Transition of Care [facility]) - CM/SW Discharge Note   Patient Details  Name: [name] [name] MRN: [id number] Date of Birth: Aug 30, 1945  [facility] CM/SW Contact:  Zenon Mayo, RN Phone Number: 07/23/2020, 3:59 PM   Clinical Narrative:    NCM spoke with patient and her granddaughter at bedside, she lives alone, she will need a rollator and a 3 n 1. Patient is ok with [employer] doing the DME.  Patient grand daughter chose [employer] Lemm[employer] from the Medicare.gov form.  NCM made referral to [facility] with [facility] Lem[facility]ings for [place], he states he is able to take referral. Soc will begin on Monday. NCM made referral to [facility] with [employer] for the rollator and the bsc. , patient states she has pulse ox, bp cuff and a scale at home.  She also has a w/chair that is more than 75 years old.  She is for dc today and her granddaughter will transport her home.   Final next level of care: [place] Barriers to Discharge: No Barriers Identified   Patient Goals and CMS Choice Patient states their goals for this hospitalization and ongoing recovery are:: get better CMS Medicare.gov Compare Post Acute Care list provided to:: Patient Represenative (must comment) Choice offered to / list presented to : Adult Children  Discharge Placement                       Discharge Plan and Services                DME Arranged: 3-N-1,Walker rolling with seat DME Agency: [employer] Date DME Agency Contacted: 07/23/20 Time DME Agency Contacted: [id number] Representative spoke with at DME Agency: [place]: PT,OT [place] Agency: [place] Date [place]: 07/23/20 Time [place]: 1558 Representative spoke with at [place]: [place] ([place]) Interventions     Readmission Risk Interventions No flowsheet data found.

## 2020-07-23 NOTE — Discharge Summary (Signed)
Physician Discharge Summary  [name] [name] ZOX:[id number] DOB: Nov 22, 1945 DOA: 07/22/2020  PCP: Lillard Anes, MD  Admit date: 07/22/2020 Discharge date: 07/23/2020  Admitted From: home Discharge disposition: home   Recommendations for Outpatient Follow-Up:   1. Smoking cessation 2. Added compression stockings  3. Encouraged patient to get previously scheduled EGD   Discharge Diagnosis:   Active Problems:   Chest pain   COPD with acute bronchitis [facility])    Discharge Condition: Improved.  Diet recommendation: Low sodium, heart healthy.    Wound care: None.  Code status: Full.   History of Present Illness:   [name] [name] is a 75 y.o. female with medical history significant of COPD, HTN, aortic aneurysm, current smoker, chronic pain, chronic ambulation dysfunction on roller walker, presented with chest pain.  Patient was resting, suddenly started to feel epigastric cramping pain then developed tannates-like chest pain lasted for about 6 minutes, localized, with shortness of breath, no exacerbation or relieving factors.  Then patient started to feel "unable to breath deeply" and then started to feel generalized weakness unable to stand on her feet, lightheadedness and nausea, and vomited stomach content x1.  Friend called EMS, EMS arrived and found patient diaphoretic, pale looking.  Baseline, COPD poorly controlled, uses rescue pump 2 times a day.  No home O2.  Continue to smoke half a pack a day.    Hospital Course by Problem:   Chest pain -central PE ruled out, dissection ruled out.CE negative -Patient had a normal stress test last year, and patient has mild mitral regurgitation on last year's echo, echo similar to prior-- outpatient cards follow up -? Related to hiatal hernia  Acute COPD exacerbation -Short course of p.o. steroid -Continue S ABA, LABA and inhaled steroid. -encourage smoking cessation  Near syncope -Appears to be vasovagal  reaction, probably related to either chest pain or COPD. -Vital signs including heart rate and blood pressure appears to be stable -echo as above  HTN -Continue home BP meds  Anxiety/depression -Continue benzo and SSRI.  Cigarette smoking -Nicotine patch     Medical Consultants:      Discharge Exam:   Vitals:   07/23/20 0929 07/23/20 1100  BP: 137/71 (!) 128/97  Pulse:  71  Resp:  (!) 25  Temp:  98 F (36.7 C)  SpO2:  94%   Vitals:   07/23/20 0514 07/23/20 0726 07/23/20 0929 07/23/20 1100  BP: 119/84  137/71 (!) 128/97  Pulse: 63   71  Resp: 18   (!) 25  Temp:  98.1 F (36.7 C)  98 F (36.7 C)  TempSrc:  Oral  Oral  SpO2: 92%   94%  Weight:      Height:        General exam: Appears calm and comfortable.    The results of significant diagnostics from this hospitalization (including imaging, microbiology, ancillary and laboratory) are listed below for reference.     Procedures and Diagnostic Studies:   CT Head Wo Contrast  Result Date: 07/22/2020 CLINICAL DATA:  Headache.  Worsening headache. EXAM: CT HEAD WITHOUT CONTRAST TECHNIQUE: Contiguous axial images were obtained from the base of the skull through the vertex without intravenous contrast. COMPARISON:  Brain MRI 04/14/2016 FINDINGS: Brain: No acute intracranial hemorrhage. No focal mass lesion. No CT evidence of acute infarction. No midline shift or mass effect. No hydrocephalus. Basilar cisterns are patent. Small calcific density along the LEFT side of the anterior interhemispheric falx measuring 8  mm by 5 mm (image 26/2) is not changed from meningioma described on comparison MRI. Vascular: No hyperdense vessel or unexpected calcification. Skull: Normal. Negative for fracture or focal lesion. Sinuses/Orbits: Paranasal sinuses and mastoid air cells are clear. Orbits are clear. Other: None. IMPRESSION: No acute intracranial findings Mild atrophy and white matter microvascular disease Electronically  Signed   By: Suzy Bouchard M.D.   On: 07/22/2020 13:26   DG Chest Portable 1 View  Result Date: 07/22/2020 CLINICAL DATA:  Dizziness and syncope today, history COPD, enlarged aorta EXAM: PORTABLE CHEST 1 VIEW COMPARISON:  Portable exam 1313 hours compared to 04/16/2019 FINDINGS: Upper normal heart size. Mediastinal contours and pulmonary vascularity normal. Atherosclerotic calcification aorta. Lungs clear. No acute infiltrate, pleural effusion, or pneumothorax. Bones demineralized. IMPRESSION: No acute abnormalities. Aortic Atherosclerosis (ICD10-I70.0). Electronically Signed   By: Lavonia Dana M.D.   On: 07/22/2020 13:41   ECHOCARDIOGRAM COMPLETE  Result Date: 07/23/2020    ECHOCARDIOGRAM REPORT   Patient Name:   [name] [name] Date of Exam: 07/23/2020 Medical Rec #:  [id number]    Height:       62.0 in Accession #:    [id number]   Weight:       169.8 lb Date of Birth:  [birth year]    BSA:          1.783 m Patient Age:    3 years     BP:           128/97 mmHg Patient Gender: F            HR:           75 bpm. Exam Location:  Inpatient Procedure: 2D Echo Indications:    Chest Pain R07.9  History:        Patient has prior history of Echocardiogram examinations, most                 recent 09/02/2019. COPD; Risk Factors:Dyslipidemia.  Sonographer:    Mikki Santee RDCS (AE) Referring Phys: [id number] Lequita Halt IMPRESSIONS  1. Left ventricular ejection fraction, by estimation, is 70 to 75%. The left ventricle has hyperdynamic function. The left ventricle has no regional wall motion abnormalities. There is mild left ventricular hypertrophy. Left ventricular diastolic parameters are consistent with Grade I diastolic dysfunction (impaired relaxation).  2. Right ventricular systolic function is normal. The right ventricular size is normal.  3. The mitral valve is normal in structure. No evidence of mitral valve regurgitation. No evidence of mitral stenosis.  4. The aortic valve is normal in structure. There is  moderate calcification of the aortic valve. There is mild thickening of the aortic valve. Aortic valve regurgitation is not visualized. Mild to moderate aortic valve sclerosis/calcification is present, without any evidence of aortic stenosis.  5. The inferior vena cava is normal in size with greater than 50% respiratory variability, suggesting right atrial pressure of 3 mmHg. FINDINGS  Left Ventricle: Left ventricular ejection fraction, by estimation, is 70 to 75%. The left ventricle has hyperdynamic function. The left ventricle has no regional wall motion abnormalities. The left ventricular internal cavity size was normal in size. There is mild left ventricular hypertrophy. Left ventricular diastolic parameters are consistent with Grade I diastolic dysfunction (impaired relaxation). Right Ventricle: The right ventricular size is normal. No increase in right ventricular wall thickness. Right ventricular systolic function is normal. Left Atrium: Left atrial size was normal in size. Right Atrium: Right atrial size was normal in size. Pericardium: There  is no evidence of pericardial effusion. Mitral Valve: The mitral valve is normal in structure. No evidence of mitral valve regurgitation. No evidence of mitral valve stenosis. Tricuspid Valve: The tricuspid valve is normal in structure. Tricuspid valve regurgitation is not demonstrated. No evidence of tricuspid stenosis. Aortic Valve: The aortic valve is normal in structure. There is moderate calcification of the aortic valve. There is mild thickening of the aortic valve. Aortic valve regurgitation is not visualized. Mild to moderate aortic valve sclerosis/calcification is present, without any evidence of aortic stenosis. Pulmonic Valve: The pulmonic valve was normal in structure. Pulmonic valve regurgitation is not visualized. No evidence of pulmonic stenosis. Aorta: The aortic root is normal in size and structure. Venous: The inferior vena cava is normal in size with  greater than 50% respiratory variability, suggesting right atrial pressure of 3 mmHg. IAS/Shunts: No atrial level shunt detected by color flow Doppler.  LEFT VENTRICLE PLAX 2D LVIDd:         4.10 cm  Diastology LVIDs:         2.40 cm  LV e' medial:    6.20 cm/s LV PW:         1.20 cm  LV E/e' medial:  12.2 LV IVS:        1.20 cm  LV e' lateral:   8.05 cm/s LVOT diam:     2.00 cm  LV E/e' lateral: 9.4 LV SV:         79 LV SV Index:   44 LVOT Area:     3.14 cm  RIGHT VENTRICLE RV S prime:     17.20 cm/s TAPSE (M-mode): 1.8 cm LEFT ATRIUM             Index       RIGHT ATRIUM          Index LA diam:        4.60 cm 2.58 cm/m  RA Area:     8.99 cm LA Vol (A2C):   26.4 ml 14.81 ml/m RA Volume:   12.00 ml 6.73 ml/m LA Vol (A4C):   48.6 ml 27.26 ml/m LA Biplane Vol: 37.3 ml 20.92 ml/m  AORTIC VALVE LVOT Vmax:   117.00 cm/s LVOT Vmean:  74.900 cm/s LVOT VTI:    0.252 m  AORTA Ao Root diam: 2.60 cm MITRAL VALVE MV Area (PHT): 2.50 cm     SHUNTS MV Decel Time: 303 msec     Systemic VTI:  0.25 m MV E velocity: 75.90 cm/s   Systemic Diam: 2.00 cm MV A velocity: 112.00 cm/s MV E/A ratio:  0.68 Candee Furbish MD Electronically signed by Candee Furbish MD Signature Date/Time: 07/23/2020/2:55:08 PM    Final    CT Angio Chest/Abd/Pel for Dissection W and/or Wo Contrast  Result Date: 07/22/2020 CLINICAL DATA:  Syncope.  Concern for aortic aneurysm EXAM: CT ANGIOGRAPHY CHEST, ABDOMEN AND PELVIS TECHNIQUE: Non-contrast CT of the chest was initially obtained. Multidetector CT imaging through the chest, abdomen and pelvis was performed using the standard protocol during bolus administration of intravenous contrast. Multiplanar reconstructed images and MIPs were obtained and reviewed to evaluate the vascular anatomy. CONTRAST:  147mL OMNIPAQUE IOHEXOL 350 MG/ML SOLN COMPARISON:  12/08/2019, 03/20/2020, 08/15/2013. FINDINGS: CTA CHEST FINDINGS Cardiovascular: Heart size is normal. No pericardial effusion. Noncontrast CT through the  chest demonstrates no evidence of thoracic intramural hematoma. No thoracic dissection. Thoracic aorta is normal in caliber without aneurysm. There is tortuosity of the descending thoracic aorta. 3 vessel arch.  Scattered atherosclerotic calcifications of the aorta and coronary arteries. Central pulmonary vasculature appears within normal limits. No central filling defects. Mediastinum/Nodes: No axillary, mediastinal, or hilar lymphadenopathy. Calcified mediastinal and right hilar lymph nodes again noted compatible with chronic granulomatous disease. No thyroid nodule. Trachea within normal limits. Moderate hiatal hernia. Esophagus within normal limits. Lungs/Pleura: 6 mm left upper lobe pulmonary nodule is stable from 2015, benign. No focal airspace consolidation. No pleural effusion. No pneumothorax. Musculoskeletal: Degenerative disc disease of the visualized lower cervical spine. Mild thoracic spondylosis. No acute osseous findings. No chest wall abnormality. Review of the MIP images confirms the above findings. CTA ABDOMEN AND PELVIS FINDINGS VASCULAR Aorta: Normal caliber aorta without aneurysm, dissection, vasculitis or significant stenosis. There is irregular noncalcified and calcified atherosclerotic plaque throughout the abdominal aorta. Celiac: Patent without evidence of aneurysm, dissection, vasculitis or significant stenosis. SMA: Patent without evidence of aneurysm, dissection, vasculitis or significant stenosis. Renals: Both renal arteries are patent without evidence of aneurysm, dissection, vasculitis, fibromuscular dysplasia or significant stenosis. There are 2 right and 3 left renal arteries. IMA: Patent. Inflow: Patent without evidence of aneurysm, dissection, vasculitis or significant stenosis. Veins: No obvious venous abnormality within the limitations of this arterial phase study. Review of the MIP images confirms the above findings. NON-VASCULAR Hepatobiliary: No focal liver abnormality is  seen. No gallstones, gallbladder wall thickening, or biliary dilatation. Pancreas: Unremarkable. No pancreatic ductal dilatation or surrounding inflammatory changes. Spleen: Normal in size without focal abnormality. Adrenals/Urinary Tract: Nodular appearance of the bilateral adrenal glands is unchanged. Bilateral nonobstructing renal calculi, similar to prior. Symmetric enhancement of the bilateral kidneys. No hydronephrosis. Urinary bladder within normal limits. Stomach/Bowel: Moderate hiatal hernia. Stomach otherwise unremarkable. No dilated loops of small bowel. Sigmoid diverticulosis with additional scattered diverticula throughout the remaining colon. No focal bowel wall thickening or inflammatory changes. Lymphatic: No abdominopelvic lymphadenopathy. Reproductive: Status post hysterectomy. No adnexal masses. Other: No free fluid. No abdominopelvic fluid collection. No pneumoperitoneum. No abdominal wall hernia. Musculoskeletal: Advanced multilevel degenerative changes throughout the lumbar spine, similar to prior. No new or acute osseous findings. Review of the MIP images confirms the above findings. IMPRESSION: 1. No evidence of thoracic or abdominal aortic aneurysm or dissection. 2. No acute findings within the chest, abdomen, or pelvis. 3. Colonic diverticulosis without evidence of acute diverticulitis. 4. Moderate hiatal hernia. 5. Bilateral nonobstructing renal calculi. 6. Aortic atherosclerosis (ICD10-I70.0). Electronically Signed   By: Davina Poke D.O.   On: 07/22/2020 16:39     Labs:   Basic Metabolic Panel: Recent Labs  Lab 07/22/20 1244 07/22/20 1502 07/22/20 2003  NA 135 137 137  K 3.8 3.7 3.6  CL 101 99  --   CO2 24  --   --   GLUCOSE 112* 103*  --   BUN 23 26*  --   CREATININE 0.83 0.80  --   CALCIUM 9.2  --   --    GFR Estimated Creatinine Clearance: 59.3 mL/min (by C-G formula based on SCr of 0.8 mg/dL). Liver Function Tests: Recent Labs  Lab 07/22/20 1244  AST 16   ALT 13  ALKPHOS 64  BILITOT 0.6  PROT 6.2*  ALBUMIN 3.6   No results for input(s): LIPASE, AMYLASE in the last 168 hours. No results for input(s): AMMONIA in the last 168 hours. Coagulation profile No results for input(s): INR, PROTIME in the last 168 hours.  CBC: Recent Labs  Lab 07/22/20 1502 07/22/20 2003  HGB 13.6 13.3  HCT 40.0 39.0  Cardiac Enzymes: No results for input(s): CKTOTAL, CKMB, CKMBINDEX, TROPONINI in the last 168 hours. BNP: Invalid input(s): POCBNP CBG: No results for input(s): GLUCAP in the last 168 hours. D-Dimer Recent Labs    07/22/20 1244  DDIMER 4.45*   Hgb A1c No results for input(s): HGBA1C in the last 72 hours. Lipid Profile No results for input(s): CHOL, HDL, LDLCALC, TRIG, CHOLHDL, LDLDIRECT in the last 72 hours. Thyroid function studies No results for input(s): TSH, T4TOTAL, T3FREE, THYROIDAB in the last 72 hours.  Invalid input(s): FREET3 Anemia work up No results for input(s): VITAMINB12, FOLATE, FERRITIN, TIBC, IRON, RETICCTPCT in the last 72 hours. Microbiology Recent Results (from the past 240 hour(s))  SARS CORONAVIRUS 2 (TAT 6-24 HRS) Nasopharyngeal Nasopharyngeal Swab     Status: None   Collection Time: 07/22/20 12:44 PM   Specimen: Nasopharyngeal Swab  Result Value Ref Range Status   SARS Coronavirus 2 NEGATIVE NEGATIVE Final    Comment: (NOTE) SARS-CoV-2 target nucleic acids are NOT DETECTED.  The SARS-CoV-2 RNA is generally detectable in upper and lower respiratory specimens during the acute phase of infection. Negative results do not preclude SARS-CoV-2 infection, do not rule out co-infections with other pathogens, and should not be used as the sole basis for treatment or other patient management decisions. Negative results must be combined with clinical observations, patient history, and epidemiological information. The expected result is Negative.  Fact Sheet for  Patients: SugarRoll.be  Fact Sheet for Healthcare Providers: https://www.woods-mathews.com/  This test is not yet approved or cleared by the [place] FDA and  has been authorized for detection and/or diagnosis of SARS-CoV-2 by FDA under an Emergency Use Authorization (EUA). This EUA will remain  in effect (meaning this test can be used) for the duration of the COVID-19 declaration under Se ction 564(b)(1) of the Act, 21 U.S.C. section 360bbb-3(b)(1), unless the authorization is terminated or revoked sooner.  Performed at [facility], [place] [address] [place], [place] [address]   MRSA PCR Screening     Status: None   Collection Time: 07/22/20  8:36 PM   Specimen: Nasal Mucosa; Nasopharyngeal  Result Value Ref Range Status   MRSA by PCR NEGATIVE NEGATIVE Final    Comment:        The GeneXpert MRSA Assay (FDA approved for NASAL specimens only), is one component of a comprehensive MRSA colonization surveillance program. It is not intended to diagnose MRSA infection nor to guide or monitor treatment for MRSA infections. Performed at [facility], [place] [address] [place], [place] [address]      Discharge Instructions:   Discharge Instructions    Diet - low sodium heart healthy   Complete by: As directed    Discharge instructions   Complete by: As directed    Follow up with Dr. Lyndel Safe to have EGD previously scheduled Compression stockings when up walking   Increase activity slowly   Complete by: As directed      Allergies as of 07/23/2020      Reactions   Meperidine Anaphylaxis, Other (See Comments)   Blood pressure dropped, also   Meperidine Hcl Anaphylaxis, Other (See Comments)   B/P dropped, also   Influenza Vaccines Other (See Comments)   Flu-like symptoms   Other Rash, Other (See Comments)   asprage      Medication List    STOP taking these medications   ibuprofen 200 MG tablet Commonly known as:  ADVIL   potassium chloride SA 20 MEQ  tablet Commonly known as: KLOR-CON     TAKE these medications   albuterol (2.5 MG/3ML) 0.083% nebulizer solution Commonly known as: PROVENTIL Take 3 mLs (2.5 mg total) by nebulization every 6 (six) hours as needed for wheezing or shortness of breath.   albuterol 108 (90 Base) MCG/ACT inhaler Commonly known as: VENTOLIN HFA INHALE 2 PUFFS INTO THE LUNGS EVERY 6 HOURS AS NEEDED FOR WHEEZING OR SHORTNESS OF BREATH   Budeson-Glycopyrrol-Formoterol 160-9-4.8 MCG/ACT Aero Inhale 2 puffs into the lungs in the morning and at bedtime.   buPROPion 150 MG 24 hr tablet Commonly known as: Wellbutrin XL Take 1 tablet (150 mg total) by mouth daily.   cloNIDine 0.1 MG tablet Commonly known as: CATAPRES Take 1 tablet (0.1 mg total) by mouth 2 (two) times daily.   diazepam 5 MG tablet Commonly known as: VALIUM Take 1 tablet (5 mg total) by mouth every 12 (twelve) hours as needed for anxiety. Watch for sedation   diclofenac Sodium 1 % Gel Commonly known as: VOLTAREN Apply 2 g topically 4 (four) times daily.   lisinopril-hydrochlorothiazide 20-12.5 MG tablet Commonly known as: ZESTORETIC Take 2 tablets by mouth daily.   loperamide 2 MG capsule Commonly known as: IMODIUM Take by mouth.   nicotine 14 mg/24hr patch Commonly known as: NICODERM CQ - dosed in mg/24 hours Place 1 patch (14 mg total) onto the skin daily. Start taking on: July 24, 2020   nitroGLYCERIN 0.4 MG SL tablet Commonly known as: NITROSTAT Place 1 tablet (0.4 mg total) under the tongue every 5 (five) minutes as needed for chest pain.   omeprazole 40 MG capsule Commonly known as: PRILOSEC Take 1 capsule (40 mg total) by mouth in the morning and at bedtime. What changed: when to take this   oxyCODONE-acetaminophen 10-325 MG tablet Commonly known as: PERCOCET Take 1 tablet by mouth every 6 (six) hours as needed for pain.   predniSONE 20 MG tablet Commonly known as:  DELTASONE Take 2 tablets (40 mg total) by mouth daily with breakfast. Start taking on: July 24, 2020   promethazine 25 MG tablet Commonly known as: PHENERGAN Take 1 tablet (25 mg total) by mouth every 6 (six) hours as needed for nausea or vomiting.   traZODone 100 MG tablet Commonly known as: DESYREL Take 1 tablet (100 mg total) by mouth at bedtime.   Vitamin D (Ergocalciferol) 1.25 MG (50000 UNIT) Caps capsule Commonly known as: DRISDOL Take 1 capsule (50,000 Units total) by mouth every 7 (seven) days.            Durable Medical Equipment  (From admission, onward)         Start     Ordered   07/23/20 1458  For home use only DME 3 n 1  Once        07/23/20 1457   07/23/20 1457  For home use only DME 4 wheeled rolling walker with seat  Once       Question:  Patient needs a walker to treat with the following condition  Answer:  Weakness   07/23/20 1457          Follow-up Information    Lillard Anes, MD Follow up in 1 week(s).   Specialty: Family Medicine Why: bmp and BP check Contact information: [address] [place] [address] [phone]        Berniece Salines, DO .   Specialty: Cardiology Contact information: [place] [address] [phone]  Time coordinating discharge: 35 min  Signed:  Geradine Girt DO  [facility] 07/23/2020, 3:23 PM

## 2020-07-23 NOTE — Plan of Care (Signed)
  Problem: Education: Goal: Knowledge of General Education information will improve Description Including pain rating scale, medication(s)/side effects and non-pharmacologic comfort measures Outcome: Progressing   

## 2020-07-23 NOTE — Progress Notes (Signed)
  Echocardiogram 2D Echocardiogram has been performed.  Jennette Dubin 07/23/2020, 1:49 PM

## 2020-07-23 NOTE — TOC Progression Note (Signed)
Transition of Care [facility]) - Progression Note    Patient Details  Name: [name] [name] MRN: [id number] Date of Birth: May 11, 1946  [facility]) CM/SW Contact  Zenon Mayo, RN Phone Number: 07/23/2020, 3:22 PM  Clinical Narrative:    NCM spoke with patient and her granddaughter at bedside, she lives alone, she will need a rollator and a 3 n 1. Patient is ok with [employer] doing the DME.  Patient grand daughter chose [employer] from the Medicare.gov form.  NCM made referral to [facility] with [facility] for [place], [place], he states he is able to take referral. Soc will begin on Monday. NCM made referral to [facility] with [employer] for the rollator and the bsc. , patient states she has pulse ox, bp cuff and a scale at home.  She also has a w/chair that is more than 75 years old.  She is for dc today and her granddaughter will transport her home.           Expected Discharge Plan and Services           Expected Discharge Date: 07/23/20                                     Social Determinants of Health (SDOH) Interventions    Readmission Risk Interventions No flowsheet data found.

## 2020-07-23 NOTE — Progress Notes (Signed)
Discharged home accompanied by granddaughter, 3 in 1  and Rolator  with pt.

## 2020-07-23 NOTE — Evaluation (Addendum)
Physical Therapy Evaluation Patient Details Name: [name] [name] MRN: [id number] DOB: 02/12/1946 Today's Date: 07/23/2020   History of Present Illness  75 yo female admitted to ED on 2/24 with syncopal episode, preceded by chest pain with shortness of breath and epigastric cramping, lasting <10 minutes. Suspect acute COPD exacerbation. CT angiogram negative for PE or aneurysm dissection. PMH includes COPD, HTN, aortic aneurysm, current smoker (1/2 pack a day), chronic pain, L TKR, chronic ambulation dysfunction on roller walker.  Clinical Impression   Pt presents with generalized weakness, chronic back pain, L flank pain during mobility, dyspnea on exertion with SPO2 88% and greater on RA, increased time and effort to mobilize, and decreased activity tolerance vs baseline. Pt to benefit from acute PT to address deficits. Pt ambulated short hallway distance, overall requiring close guard for safety. Pt had to take several standing rest breaks due to L flank pain, RN notified. Pt adamant that she wants to d/c home even though she lacks consistent assist, PT recommending HHPT and increased family assist at d/c. Pt expresses she does not want therapies after d/c because "going to PT makes me hurt". PT to progress mobility as tolerated, and will continue to follow acutely.      07/23/20 1100  Orthostatic Lying   BP- Lying 153/69  Pulse- Lying 69  Orthostatic Sitting  BP- Sitting 144/84  Pulse- Sitting 75  Orthostatic Standing at 0 minutes  BP- Standing at 0 minutes 142/86  Pulse- Standing at 0 minutes 85  Orthostatic Standing at 3 minutes  BP- Standing at 3 minutes (!) 116/96  Pulse- Standing at 3 minutes 91      Follow Up Recommendations Home health PT;Supervision for mobility/OOB    Equipment Recommendations  3in1 (PT);Other (comment) (rollator)    Recommendations for Other Services       Precautions / Restrictions Precautions Precautions: Fall Restrictions Weight Bearing  Restrictions: No      Mobility  Bed Mobility Overal bed mobility: Needs Assistance Bed Mobility: Supine to Sit     Supine to sit: Supervision;HOB elevated     General bed mobility comments: for safety only, increased time to perform    Transfers Overall transfer level: Needs assistance Equipment used: Rolling walker (2 wheeled) Transfers: Sit to/from Stand Sit to Stand: Min guard         General transfer comment: for safety, verbal cuing for hand placement when rising/sitting  Ambulation/Gait Ambulation/Gait assistance: Min guard Gait Distance (Feet): 45 Feet Assistive device: Rolling walker (2 wheeled) Gait Pattern/deviations: Step-through pattern;Decreased stride length;Trunk flexed Gait velocity: decr   General Gait Details: min guard for safety, verbal cuing for upright posture, placement in RW. Pt taking multiple standing rests due to DOE 2/4, L flank pain. Spo2 88% and greater on RA when good pleth  Stairs            Wheelchair Mobility    Modified Rankin (Stroke Patients Only)       Balance Overall balance assessment: Needs assistance;History of Falls Sitting-balance support: No upper extremity supported Sitting balance-Leahy Scale: Fair     Standing balance support: Bilateral upper extremity supported Standing balance-Leahy Scale: Poor Standing balance comment: reliant on external assist                             Pertinent Vitals/Pain Pain Assessment: Faces Faces Pain Scale: Hurts even more Pain Location: L side (hernia), back with radiating symptoms down legs Pain Descriptors /  Indicators: Grimacing;Sore;Sharp;Tingling Pain Intervention(s): Limited activity within patient's tolerance;Monitored during session;Repositioned    Home Living Family/patient expects to be discharged to:: Private residence Living Arrangements: Alone Available Help at Discharge: Family;Available PRN/intermittently;Friend(s);Neighbor (granddaughter  lives in [place], ~35 minute drive from pt) Type of Home: [place] Access: Level entry     Home Layout: One Gans: [place] - 2 wheels;Electric scooter;Cane - quad;Grab bars - tub/shower;Grab bars - toilet      Prior Function Level of Independence: Needs assistance   Gait / Transfers Assistance Needed: pt reports walking with cane or RW for short distances, uses electric scooter to get to convenient store, laundromat  ADL's / Homemaking Assistance Needed: Pt reports going with family to the grocery store for grocery shopping; independent with cooking, cleaning, dressing, bathing        Hand Dominance   Dominant Hand: Right    Extremity/Trunk Assessment   Upper Extremity Assessment Upper Extremity Assessment: Defer to OT evaluation    Lower Extremity Assessment Lower Extremity Assessment: Generalized weakness    Cervical / Trunk Assessment Cervical / Trunk Assessment: Other exceptions Cervical / Trunk Exceptions: forward flexed trunk during gait  Communication   Communication: No difficulties  Cognition Arousal/Alertness: Awake/alert Behavior During Therapy: WFL for tasks assessed/performed Overall Cognitive Status: Within Functional Limits for tasks assessed                                 General Comments: Lacks insight into functional deficits and how that applies to function at home      General Comments General comments (skin integrity, edema, etc.): chronic back pain, pt reports going to a pain clinic    Exercises     Assessment/Plan    PT Assessment Patient needs continued PT services  PT Problem List Decreased strength;Decreased mobility;Decreased safety awareness;Decreased activity tolerance;Decreased balance;Decreased knowledge of use of DME;Pain       PT Treatment Interventions DME instruction;Therapeutic activities;Gait training;Therapeutic exercise;Patient/family education;Balance training;Functional mobility  training;Neuromuscular re-education    PT Goals (Current goals can be found in the Care Plan section)  Acute Rehab PT Goals Patient Stated Goal: go home to return to independence PT Goal Formulation: With patient Time For Goal Achievement: 08/06/20 Potential to Achieve Goals: Good    Frequency Min 3X/week   Barriers to discharge        Co-evaluation               AM-PAC PT "6 Clicks" Mobility  Outcome Measure Help needed turning from your back to your side while in a flat bed without using bedrails?: None Help needed moving from lying on your back to sitting on the side of a flat bed without using bedrails?: A Little Help needed moving to and from a bed to a chair (including a wheelchair)?: A Little Help needed standing up from a chair using your arms (e.g., wheelchair or bedside chair)?: A Little Help needed to walk in hospital room?: A Little Help needed climbing 3-5 steps with a railing? : A Little 6 Click Score: 19    End of Session   Activity Tolerance: Patient limited by fatigue;Patient limited by pain Patient left: in chair;with chair alarm set;with call bell/phone within reach Nurse Communication: Mobility status PT Visit Diagnosis: Other abnormalities of gait and mobility (R26.89);Difficulty in walking, not elsewhere classified (R26.2)    Time: 1001-1035 PT Time Calculation (min) (ACUTE ONLY): 34 min   Charges:  PT Evaluation $PT Eval Low Complexity: 1 Low PT Treatments $Gait Training: 8-22 mins       Stacie Glaze, PT Acute Rehabilitation Services Pager [phone]  Office [phone]  Roxine Caddy E Ruffin Pyo 07/23/2020, 11:59 AM

## 2020-07-23 NOTE — Progress Notes (Addendum)
Initial Nutrition Assessment  DOCUMENTATION CODES:   Non-severe (moderate) malnutrition in context of chronic illness  INTERVENTION:   -liberalize diet to regular   -MVI with minerals po daily  -Ensure Enlive BID, each supplement provides 350 kcal, 20 grams protein   NUTRITION DIAGNOSIS:   Moderate Malnutrition related to chronic illness (COPD) as evidenced by moderate muscle depletion,energy intake < 75% for > or equal to 1 month.  GOAL:   Patient will meet greater than or equal to 90% of their needs  MONITOR:   PO intake,Supplement acceptance,Skin,Labs,Weight trends  REASON FOR ASSESSMENT:   Consult Assessment of nutrition requirement/status  ASSESSMENT:   59 YOF admitted for COPD with acute bronchitis. PMH of COPD, HLD, HTN, aortic aneurysm.  Per chart, pt consumed 90% of breakfast on 2/25. Pt discussed that she has had a poor appetite PTA since approximately April and has had a poor appetite since being admitted. Pt mentioned that she felt her appetite had been decreased due to not feeling good, being depressed and feeling full quickly. She mentioned that she typically consumes one meal and one snack a day which usually consists of:  Snack - popcorn or pretzels  Dinner - lean cuisine or scrambled eggs and toast or cereal  Pt noted that she was experiencing nausea and vomiting a few days PTA, however she mentioned that this is not a usual occurrence. Pt also noted that she has previously had esophageal stricture problems but has not experienced symptoms associated with this since her esophageal dilation in 2017. Pt discussed with intern and RD that she had dentures, however did not experience any chewing or swallowing difficulties.   Pt reports a UBW of 164# with the typical range of 160-170#. Pt denied any recent weight loss within the past year. Records indicate pt has not had any significant weight losses within the past year.   Pt mentioned that she is mobile and does  not use a walker or any assistance, however does fall frequently.   Meds Reviewed: Deltasone (daily)  Labs Reviewed.  NUTRITION - FOCUSED PHYSICAL EXAM:  Flowsheet Row Most Recent Value  Orbital Region No depletion  Upper Arm Region Moderate depletion  Thoracic and Lumbar Region No depletion  Buccal Region No depletion  Temple Region Moderate depletion  Clavicle Bone Region No depletion  Clavicle and Acromion Bone Region Mild depletion  Scapular Bone Region No depletion  Dorsal Hand Moderate depletion  Patellar Region Unable to assess  [bilateral fluid]  Anterior Thigh Region Moderate depletion  Posterior Calf Region Moderate depletion  Edema (RD Assessment) Mild  [bilateral knees]  Hair Reviewed  Eyes Reviewed  Mouth Reviewed  Skin Reviewed  Nails Reviewed       Diet Order:   Diet Order            Diet regular Room service appropriate? Yes; Fluid consistency: Thin  Diet effective now                 EDUCATION NEEDS:   No education needs have been identified at this time  Skin:  Skin Assessment: Reviewed RN Assessment  Last BM:  2/23  Height:   Ht Readings from Last 1 Encounters:  07/22/20 5\' 2"  (1.575 m)    Weight:   Wt Readings from Last 1 Encounters:  07/22/20 77 kg    Ideal Body Weight:  52.3 kg  BMI:  Body mass index is 31.05 kg/m.  Estimated Nutritional Needs:   Kcal:  1500-1700  Protein:  75-90 g  Fluid:  >/=1.5 L    Salvadore Oxford, Dietetic Intern 07/23/2020 1:59 PM

## 2020-07-23 NOTE — Progress Notes (Signed)
Transported to vascular dept . by bed for echo.

## 2020-07-26 ENCOUNTER — Telehealth: Payer: Self-pay

## 2020-07-26 NOTE — Telephone Encounter (Signed)
  Transition Care Management Follow-up Telephone Call    [name] [name] 1946-03-02  Admit Date: 07/22/20 Discharge Date: 07/23/20 Discharged from where: [facility]  Diagnoses: Chest Pain, COPD, HTN  2 day post discharge: 07/25/20 7 day post discharge: 07/30/20 14 day post discharge: 08/06/20  [name] [name] was discharged from [facility] on Friday 07/23/20 with the diagnoses listed above.  She was contacted today via telephone in regards to transition of care.  She states that she continues to be nauseous however has not vomited.    She has called Dr Steve Rattler office for an appointment and is awaiting a call back.  She has an RX from Dr Henrene Pastor for [place] that she is using.  She presented to the ED with sudden epigastric cramping and vomited once.  She stated that she felt she could not get a good deep breath.  Patient does have a hiatal hernia.  Imaging done: CT Head, Echo, and CTA Chest/Abdomen/Pelvis done (results in chart)   Discharge Instructions: Stop Smoking, follow-up with Dr Lyndel Safe and Dr Henrene Pastor  Items Reviewed:  Did the pt receive and understand the discharge instructions provided? Yes   Medications obtained and verified? Yes   Other? No   Any new allergies since your discharge? No   Dietary orders reviewed? Yes  Do you have support at home? Yes   Home Care and Equipment/Supplies: Were home health services ordered? yes If so, what is the name of the agency? unknown  Has the agency set up a time to come to the patient's home? no Were any new equipment or medical supplies ordered?  No What is the name of the medical supply agency? N/A Were you able to get the supplies/equipment? not applicable Do you have any questions related to the use of the equipment or supplies? No  Functional Questionnaire: (I = Independent and D = Dependent) ADLs: I  Bathing/Dressing- I  Meal Prep- I  Eating- I  Maintaining continence- I  Transferring/Ambulation- I  Managing Meds-  I   Any patient concerns? no  Follow up appointments reviewed:  PCP Hospital f/u appt confirmed? No  Patient will call back to make appointment because she has multiple upcoming appointments and does not have the dates in front of her  [facility] f/u appt confirmed? No  Awaiting call from Dr Steve Rattler office  Are transportation arrangements needed? No   If their condition worsens, is the pt aware to call PCP or go to the Emergency Dept.? Yes  Was the patient provided with contact information for the PCP's office/after hours number? Yes  Was to pt encouraged to call back with questions or concerns? Yes    Shelle Iron, LPN 44/96/75 9:16 AM

## 2020-07-29 DIAGNOSIS — G8929 Other chronic pain: Secondary | ICD-10-CM | POA: Diagnosis not present

## 2020-07-29 DIAGNOSIS — I119 Hypertensive heart disease without heart failure: Secondary | ICD-10-CM | POA: Diagnosis not present

## 2020-07-29 DIAGNOSIS — I2510 Atherosclerotic heart disease of native coronary artery without angina pectoris: Secondary | ICD-10-CM | POA: Diagnosis not present

## 2020-07-29 DIAGNOSIS — J441 Chronic obstructive pulmonary disease with (acute) exacerbation: Secondary | ICD-10-CM | POA: Diagnosis not present

## 2020-07-29 DIAGNOSIS — I714 Abdominal aortic aneurysm, without rupture: Secondary | ICD-10-CM | POA: Diagnosis not present

## 2020-08-02 ENCOUNTER — Encounter: Payer: Self-pay | Admitting: Internal Medicine

## 2020-08-02 ENCOUNTER — Ambulatory Visit (INDEPENDENT_AMBULATORY_CARE_PROVIDER_SITE_OTHER): Payer: Medicare Other | Admitting: Internal Medicine

## 2020-08-02 ENCOUNTER — Other Ambulatory Visit: Payer: Self-pay

## 2020-08-02 VITALS — BP 96/70 | HR 80 | Temp 97.1°F | Ht 62.0 in | Wt 166.6 lb

## 2020-08-02 DIAGNOSIS — F17210 Nicotine dependence, cigarettes, uncomplicated: Secondary | ICD-10-CM | POA: Diagnosis not present

## 2020-08-02 DIAGNOSIS — J449 Chronic obstructive pulmonary disease, unspecified: Secondary | ICD-10-CM

## 2020-08-02 NOTE — Progress Notes (Signed)
[name] [name]    [id number]    Feb 14, 1946  Primary Care Physician:Perry, Zeb Comfort, MD Date of Appointment: 08/02/2020 Established Patient Visit  Chief complaint:   Chief Complaint  Patient presents with  . Hospitalization Follow-up    Admitted 07/25/20-07/26/20 COPD exacerbation.  Coughing up green mucus.  SOB.  Needs new neb machine.     HPI: [name] [name] is a 75 y.o. woman with COPD and every day tobacco use  Interval Updates:  Here for follow up today. Hospitalized in February for COPD exacerbation. Still smoking about a pack a day.  Her nebulizer machine broke at home. Taking albuterol and breztri. Not sure if they are helping.  Coughs a lot - wakes up at night coughing, greenish sputum production.   She is walking with a walker now because she gets tired easily.   I have reviewed the patient's family social and past medical history and updated as appropriate.   Past Medical History:  Diagnosis Date  . Chronic left shoulder pain 09/27/2015  . COPD (chronic obstructive pulmonary disease) ([place])   . Enlarged aorta ([place])   . Hyperlipidemia   . Osteoarthritis   . Pain of right hip joint 11/22/2015  . Primary osteoarthritis of left knee 02/04/2015  . S/P total knee arthroplasty 02/15/2015  . SOB (shortness of breath)     Past Surgical History:  Procedure Laterality Date  . ABDOMINAL HYSTERECTOMY    . BREAST BIOPSY Right   . CHOLECYSTECTOMY    . COLONOSCOPY  09/04/2011   Colonic polyps, status post polyectomy. Incidental small ascending colon lipoma  . ESOPHAGOGASTRODUODENOSCOPY  09/08/2015   Schatzki ring status post esophageal dilitation. Small hiatal hernia  . hemorrhoid surgery    . KNEE SURGERY     left  . TOTAL KNEE ARTHROPLASTY Left 02/15/2015   Procedure: TOTAL KNEE ARTHROPLASTY;  Surgeon: Vickey Huger, MD;  Location: [facility];  Service: Orthopedics;  Laterality: Left;  . TUBAL LIGATION    . WRIST SURGERY     right    Family History  Problem  Relation Age of Onset  . Heart disease Mother   . Cancer Father        unknown  . Diabetes Father   . Heart disease Father   . Stroke Father   . Lupus Sister   . Seizures Son   . COPD Son     Social History   Occupational History  . Occupation: Retired  Tobacco Use  . Smoking status: Current Every Day Smoker    Packs/day: 1.50    Years: 47.00    Pack years: 70.50    Types: Cigarettes  . Smokeless tobacco: Never Used  . Tobacco comment: down to 1 pack a day  Vaping Use  . Vaping Use: Never used  Substance and Sexual Activity  . Alcohol use: No  . Drug use: No  . Sexual activity: Not Currently     Physical Exam: Blood pressure 96/70, pulse 80, temperature (!) 97.1 F (36.2 C), temperature source Temporal, height 5\' 2"  (1.575 m), weight 166 lb 9.6 oz (75.6 kg), SpO2 100 %.  Gen:      Chronically ill appearing Lungs:   Diminished, mild end expiratory wheezes CV:         Regular rate and rhythm; no murmurs, rubs, or gallops.  No pedal edema   Data Reviewed: Imaging: I have personally reviewed the CT angio from Feb 2022 shows no PE.  Stable 55mm pulmonary nodule.   PFTs: None  Labs:  Immunization status: Immunization History  Administered Date(s) Administered  . Fluad Quad(high Dose 65+) 03/30/2020  . Influenza, High Dose Seasonal PF 03/28/2018    Assessment:  COPD Ongoing tobacco use disorder    Plan/Recommendations:  Continue breztri, albuterol Will obtain PFTs  Smoking Cessation Counseling:  1. The patient is an everyday smoker and symptomatic due to the following condition COPD 2. The patient is currently pre-contemplative in quitting smoking. 3. I advised patient to quit smoking. 4. We identified patient specific barriers to change.  5. I personally spent 3 minutes counseling the patient regarding tobacco use disorder. 6. We discussed management of stress and anxiety to help with smoking cessation, when applicable. 7. We discussed nicotine  replacement therapy, Wellbutrin, Chantix as possible options. 8. I advised setting a quit date. 9. Follow?up arranged with our office to continue ongoing discussions. 10.Resources given to patient including quit hotline.    Return to Care: Return in about 3 months (around 11/02/2020).   Lenice Llamas, MD Pulmonary and [place]

## 2020-08-02 NOTE — Patient Instructions (Addendum)
The patient should have follow up scheduled with myself in 3 months.   Prior to next visit patient should have: Full set of PFTs- one hour. See me afterwards.   We will get you a new nebulizer machine at home.   What are the benefits of quitting smoking? Quitting smoking can lower your chances of getting or dying from heart disease, lung disease, kidney failure, infection, or cancer. It can also lower your chances of getting osteoporosis, a condition that makes your bones weak. Plus, quitting smoking can help your skin look younger and reduce the chances that you will have problems with sex.  Quitting smoking will improve your health no matter how old you are, and no matter how long or how much you have smoked.  What should I do if I want to quit smoking? The letters in the word "START" can help you remember the steps to take: S = Set a quit date. T = Tell family, friends, and the people around you that you plan to quit. A = Anticipate or plan ahead for the tough times you'll face while quitting. R = Remove cigarettes and other tobacco products from your home, car, and work. T = Talk to your doctor about getting help to quit.  How can my doctor or nurse help? Your doctor or nurse can give you advice on the best way to quit. He or she can also put you in touch with counselors or other people you can call for support. Plus, your doctor or nurse can give you medicines to: ?Reduce your craving for cigarettes ?Reduce the unpleasant symptoms that happen when you stop smoking (called "withdrawal symptoms"). You can also get help from a free phone line (1-800-QUIT-NOW) or go online to [facility].  What are the symptoms of withdrawal? The symptoms include: ?Trouble sleeping ?Being irritable, anxious or restless ?Getting frustrated or angry ?Having trouble thinking clearly  Some people who stop smoking become temporarily depressed. Some people need treatment for depression, such as  counseling or antidepressant medicines. Depressed people might: ?No longer enjoy or care about doing the things they used to like to do ?Feel sad, down, hopeless, nervous, or cranky most of the day, almost every day ?Lose or gain weight ?Sleep too much or too little ?Feel tired or like they have no energy ?Feel guilty or like they are worth nothing ?Forget things or feel confused ?Move and speak more slowly than usual ?Act restless or have trouble staying still ?Think about death or suicide  If you think you might be depressed, see your doctor or nurse. Only someone trained in mental health can tell for sure if you are depressed. If you ever feel like you might hurt yourself, go straight to the nearest emergency department. Or you can call for an ambulance (in the [place] and [place], [place] 9-1-1) or call your doctor or nurse right away and tell them it is an emergency. You can also reach the [place] National Suicide Prevention Lifeline at 646-684-4590 or http://walker-sanchez.info/.  How do medicines help you stop smoking? Different medicines work in different ways: ?Nicotine replacement therapy eases withdrawal and reduces your body's craving for nicotine, the main drug found in cigarettes. There are different forms of nicotine replacement, including skin patches, lozenges, gum, nasal sprays, and "puffers" or inhalers. Many can be bought without a prescription, while others might require one. ?Bupropion is a prescription medicine that reduces your desire to smoke. This medicine is sold under the brand names Zyban and Wellbutrin.  It is also available in a generic version, which is cheaper than brand name medicines. ?Varenicline (brand names: Chantix, Champix) is a prescription medicine that reduces withdrawal symptoms and cigarette cravings. If you think you'd like to take varenicline and you have a history of depression, anxiety, or heart disease, discuss this with your doctor or nurse before taking  the medicine. Varenicline can also increase the effects of alcohol in some people. It's a good idea to limit drinking while you're taking it, at least until you know how it affects you.  How does counseling work? Counseling can happen during formal office visits or just over the phone. A counselor can help you: ?Figure out what triggers your smoking and what to do instead ?Overcome cravings ?Figure out what went wrong when you tried to quit before  What works best? Studies show that people have the best luck at quitting if they take medicines to help them quit and work with a Social worker. It might also be helpful to combine nicotine replacement with one of the prescription medicines that help people quit. In some cases, it might even make sense to take bupropion and varenicline together.  What about e-cigarettes? Sometimes people wonder if using electronic cigarettes, or "e-cigarettes," might help them quit smoking. Using e-cigarettes is also called "vaping." Doctors do not recommend e-cigarettes in place of medicines and counseling. That's because e-cigarettes still contain nicotine as well as other substances that might be harmful. It's not clear how they can affect a person's health in the long term.  Will I gain weight if I quit? Yes, you might gain a few pounds. But quitting smoking will have a much more positive effect on your health than weighing a few pounds more. Plus, you can help prevent some weight gain by being more active and eating less. Taking the medicine bupropion might help control weight gain.   What else can I do to improve my chances of quitting? You can: ?Start exercising. ?Stay away from smokers and places that you associate with smoking. If people close to you smoke, ask them to quit with you. ?Keep gum, hard candy, or something to put in your mouth handy. If you get a craving for a cigarette, try one of these instead. ?Don't give up, even if you start smoking again. It  takes most people a few tries before they succeed.  What if I am pregnant and I smoke? If you are pregnant, it's really important for the health of your baby that you quit. Ask your doctor what options you have, and what is safest for your baby

## 2020-08-03 ENCOUNTER — Other Ambulatory Visit: Payer: Self-pay | Admitting: Legal Medicine

## 2020-08-03 DIAGNOSIS — I714 Abdominal aortic aneurysm, without rupture: Secondary | ICD-10-CM | POA: Diagnosis not present

## 2020-08-03 DIAGNOSIS — J441 Chronic obstructive pulmonary disease with (acute) exacerbation: Secondary | ICD-10-CM | POA: Diagnosis not present

## 2020-08-03 DIAGNOSIS — I119 Hypertensive heart disease without heart failure: Secondary | ICD-10-CM | POA: Diagnosis not present

## 2020-08-03 DIAGNOSIS — F322 Major depressive disorder, single episode, severe without psychotic features: Secondary | ICD-10-CM

## 2020-08-03 DIAGNOSIS — G8929 Other chronic pain: Secondary | ICD-10-CM | POA: Diagnosis not present

## 2020-08-03 DIAGNOSIS — I2510 Atherosclerotic heart disease of native coronary artery without angina pectoris: Secondary | ICD-10-CM | POA: Diagnosis not present

## 2020-08-03 DIAGNOSIS — I7789 Other specified disorders of arteries and arterioles: Secondary | ICD-10-CM | POA: Insufficient documentation

## 2020-08-03 DIAGNOSIS — J449 Chronic obstructive pulmonary disease, unspecified: Secondary | ICD-10-CM | POA: Diagnosis not present

## 2020-08-03 DIAGNOSIS — M1990 Unspecified osteoarthritis, unspecified site: Secondary | ICD-10-CM | POA: Insufficient documentation

## 2020-08-04 ENCOUNTER — Ambulatory Visit (INDEPENDENT_AMBULATORY_CARE_PROVIDER_SITE_OTHER): Payer: Medicare Other | Admitting: Cardiology

## 2020-08-04 ENCOUNTER — Telehealth: Payer: Self-pay | Admitting: Licensed Clinical Social Worker

## 2020-08-04 ENCOUNTER — Other Ambulatory Visit: Payer: Self-pay

## 2020-08-04 ENCOUNTER — Encounter: Payer: Self-pay | Admitting: Cardiology

## 2020-08-04 VITALS — BP 128/70 | HR 74 | Ht 62.0 in | Wt 165.8 lb

## 2020-08-04 DIAGNOSIS — F17200 Nicotine dependence, unspecified, uncomplicated: Secondary | ICD-10-CM | POA: Diagnosis not present

## 2020-08-04 DIAGNOSIS — I10 Essential (primary) hypertension: Secondary | ICD-10-CM

## 2020-08-04 DIAGNOSIS — R079 Chest pain, unspecified: Secondary | ICD-10-CM

## 2020-08-04 DIAGNOSIS — E782 Mixed hyperlipidemia: Secondary | ICD-10-CM | POA: Diagnosis not present

## 2020-08-04 DIAGNOSIS — R0609 Other forms of dyspnea: Secondary | ICD-10-CM

## 2020-08-04 DIAGNOSIS — R0600 Dyspnea, unspecified: Secondary | ICD-10-CM | POA: Diagnosis not present

## 2020-08-04 DIAGNOSIS — I2510 Atherosclerotic heart disease of native coronary artery without angina pectoris: Secondary | ICD-10-CM | POA: Diagnosis not present

## 2020-08-04 NOTE — Progress Notes (Signed)
Cardiology Office Note:    Date:  08/04/2020   ID:  Rehmat, Murtagh 1945/11/07, MRN [id number]  PCP:  Lillard Anes, MD  Cardiologist:  Berniece Salines, DO  Electrophysiologist:  None   Referring MD: Lillard Anes,*   I am having worsening shortness of breath  History of Present Illness:    [name] [name] is a 75 y.o. female with a hx of hypertension, hyperlipidemia, tobacco use, mild aortic aneurysm 39 mm, occasional PACs on the monitor.  At her last visit we will stop her calcium channel blocker due to leg swelling.  But she is complaining of intermittent chest pain.  I recommended the patient undergo a cardiac CTA given the fact that her intermittent chest pain shortness of breath has gotten worse.  She had not gotten this testing because she says she did not have a ride.  Since I last saw the patient she has been hospitalized at [facility] for syncope as well as chest pain.  ED chest did not show any pulmonary embolism however there was notation of moderate hiatal hernia and coronary artery calcification.  She tells me she is experiencing significant shortness of breath on exertion and sometimes have intermittent chest pain.  She is worried about her symptoms.   Past Medical History:  Diagnosis Date  . Chronic left shoulder pain 09/27/2015  . COPD (chronic obstructive pulmonary disease) ([place])   . Enlarged aorta ([place])   . Hyperlipidemia   . Osteoarthritis   . Pain of right hip joint 11/22/2015  . Primary osteoarthritis of left knee 02/04/2015  . S/P total knee arthroplasty 02/15/2015  . SOB (shortness of breath)     Past Surgical History:  Procedure Laterality Date  . ABDOMINAL HYSTERECTOMY    . BREAST BIOPSY Right   . CHOLECYSTECTOMY    . COLONOSCOPY  09/04/2011   Colonic polyps, status post polyectomy. Incidental small ascending colon lipoma  . ESOPHAGOGASTRODUODENOSCOPY  09/08/2015   Schatzki ring status post esophageal dilitation. Small hiatal  hernia  . hemorrhoid surgery    . KNEE SURGERY     left  . TOTAL KNEE ARTHROPLASTY Left 02/15/2015   Procedure: TOTAL KNEE ARTHROPLASTY;  Surgeon: Vickey Huger, MD;  Location: [place];  Service: Orthopedics;  Laterality: Left;  . TUBAL LIGATION    . WRIST SURGERY     right    Current Medications: Current Meds  Medication Sig  . albuterol (PROVENTIL) (2.5 MG/3ML) 0.083% nebulizer solution Take 3 mLs (2.5 mg total) by nebulization every 6 (six) hours as needed for wheezing or shortness of breath.  Marland Kitchen albuterol (VENTOLIN HFA) 108 (90 Base) MCG/ACT inhaler INHALE 2 PUFFS INTO THE LUNGS EVERY 6 HOURS AS NEEDED FOR WHEEZING OR SHORTNESS OF BREATH (Patient taking differently: Inhale 2 puffs into the lungs every 6 (six) hours as needed for wheezing or shortness of breath.)  . Budeson-Glycopyrrol-Formoterol 160-9-4.8 MCG/ACT AERO Inhale 2 puffs into the lungs in the morning and at bedtime.  Marland Kitchen buPROPion (WELLBUTRIN XL) 150 MG 24 hr tablet TAKE 1 TABLET(150 MG) BY MOUTH DAILY  . cloNIDine (CATAPRES) 0.1 MG tablet Take 1 tablet (0.1 mg total) by mouth 2 (two) times daily.  . diazepam (VALIUM) 5 MG tablet Take 1 tablet (5 mg total) by mouth every 12 (twelve) hours as needed for anxiety. Watch for sedation  . diclofenac Sodium (VOLTAREN) 1 % GEL Apply 2 g topically 4 (four) times daily.  Marland Kitchen lisinopril-hydrochlorothiazide (ZESTORETIC) 20-12.5 MG tablet Take 2 tablets  by mouth daily.  Marland Kitchen loperamide (IMODIUM) 2 MG capsule Take by mouth.  Marland Kitchen omeprazole (PRILOSEC) 40 MG capsule Take 1 capsule (40 mg total) by mouth in the morning and at bedtime.  Marland Kitchen oxyCODONE-acetaminophen (PERCOCET) 10-325 MG tablet Take 1 tablet by mouth every 6 (six) hours as needed for pain.  . promethazine (PHENERGAN) 25 MG tablet Take 1 tablet (25 mg total) by mouth every 6 (six) hours as needed for nausea or vomiting.  . traZODone (DESYREL) 100 MG tablet Take 1 tablet (100 mg total) by mouth at bedtime.  . Vitamin D, Ergocalciferol, (DRISDOL)  1.25 MG (50000 UNIT) CAPS capsule Take 1 capsule (50,000 Units total) by mouth every 7 (seven) days.     Allergies:   Meperidine, Meperidine hcl, Influenza vaccines, and Other   Social History   Socioeconomic History  . Marital status: Widowed    Spouse name: Not on file  . Number of children: 4  . Years of education: Not on file  . Highest education level: Not on file  Occupational History  . Occupation: Retired  Tobacco Use  . Smoking status: Current Every Day Smoker    Packs/day: 1.50    Years: 47.00    Pack years: 70.50    Types: Cigarettes  . Smokeless tobacco: Never Used  . Tobacco comment: down to 1 pack a day  Vaping Use  . Vaping Use: Never used  Substance and Sexual Activity  . Alcohol use: No  . Drug use: No  . Sexual activity: Not Currently  Other Topics Concern  . Not on file  Social History Narrative  . Not on file   Social Determinants of Health   Financial Resource Strain: Not on file  Food Insecurity: Not on file  Transportation Needs: Not on file  Physical Activity: Not on file  Stress: Not on file  Social Connections: Not on file     Family History: The patient's family history includes COPD in her son; Cancer in her father; Diabetes in her father; Heart disease in her father and mother; Lupus in her sister; Seizures in her son; Stroke in her father.  ROS:   Review of Systems  Constitution: Negative for decreased appetite, fever and weight gain.  HENT: Negative for congestion, ear discharge, hoarse voice and sore throat.   Eyes: Negative for discharge, redness, vision loss in right eye and visual halos.  Cardiovascular: Negative for chest pain, dyspnea on exertion, leg swelling, orthopnea and palpitations.  Respiratory: Negative for cough, hemoptysis, shortness of breath and snoring.   Endocrine: Negative for heat intolerance and polyphagia.  Hematologic/Lymphatic: Negative for bleeding problem. Does not bruise/bleed easily.  Skin: Negative  for flushing, nail changes, rash and suspicious lesions.  Musculoskeletal: Negative for arthritis, joint pain, muscle cramps, myalgias, neck pain and stiffness.  Gastrointestinal: Negative for abdominal pain, bowel incontinence, diarrhea and excessive appetite.  Genitourinary: Negative for decreased libido, genital sores and incomplete emptying.  Neurological: Negative for brief paralysis, focal weakness, headaches and loss of balance.  Psychiatric/Behavioral: Negative for altered mental status, depression and suicidal ideas.  Allergic/Immunologic: Negative for HIV exposure and persistent infections.    EKGs/Labs/Other Studies Reviewed:    The following studies were reviewed today:   EKG: None today  CT scan of the chest IMPRESSION: 1. No evidence of thoracic or abdominal aortic aneurysm or dissection. 2. No acute findings within the chest, abdomen, or pelvis. 3. Colonic diverticulosis without evidence of acute diverticulitis. 4. Moderate hiatal hernia. 5. Bilateral nonobstructing renal  calculi. 6. Aortic atherosclerosis (ICD10-I70.0).  Pharmacologic stress test  Nuclear stress EF: 68%.  The left ventricular ejection fraction is hyperdynamic (>65%).  There was no ST segment deviation noted during stress.  Defect 1: There is a medium defect of moderate severity present in the basal inferolateral and apical lateral location.  This is a low risk study.  No evidence of ischemia or MI.  Normal EF.  Attenuation noted in infero-lateral wall.  Zio monitor The patient wore the monitor for 6 days 18 hours starting 11/06/2019. Indication: Palpitations The minimum heart rate was 47 bpm, maximum heart rate was 176 bpm, and average heart rate was 72 bpm. Predominant underlying rhythm was Sinus Rhythm.  22 Supraventricular Tachycardia runs occurred, the run with the fastest interval lasting 7 beats with a max rate of 176 bpm, the longest lasting 18 beats with an avg rate of 122  bpm.   Premature atrial complexes were occasional (1.1%,7818). Premature Ventricular complexes were rare (<1.0%). Ventricular Bigeminy was present.  No ventricular tachycardia, no pauses, No AV block and no atrial fibrillation present.  4 patient triggered events all associated with PACs.  Conclusion: This study is remarkable for the following: 1. Paroxysmal supraventricular tachycardia which is likely atrial tachycardia with variable block. 2. Occasional premature atrial complexes.  EchocardiogramIMPRESSIONS  1. Left ventricular ejection fraction, by estimation, is 60 to 65%. The  left ventricle has normal function. The left ventricle has no regional  wall motion abnormalities. Left ventricular diastolic parameters are  consistent with Grade II diastolic  dysfunction (pseudonormalization).  2. Right ventricular systolic function is normal. The right ventricular  size is normal. There is mildly elevated pulmonary artery systolic  pressure.  3. Left atrial size was mildly dilated.  4. The mitral valve is normal in structure. Mild mitral valve  regurgitation. No evidence of mitral stenosis.  5. The aortic valve is normal in structure. Aortic valve regurgitation is  not visualized. Mild to moderate aortic valve sclerosis/calcification is  present, without any evidence of aortic stenosis.  6. There is mild dilatation of the ascending aorta measuring 39 mm.  7. The inferior vena cava is normal in size with greater than 50%  respiratory variability, suggesting right atrial pressure of 3   Recent Labs: 02/03/2020: Magnesium 2.0 07/14/2020: Platelets 162 07/22/2020: ALT 13; B Natriuretic Peptide 19.7; BUN 26; Creatinine, Ser 0.80; Hemoglobin 13.3; Potassium 3.6; Sodium 137  Recent Lipid Panel    Component Value Date/Time   CHOL 167 07/14/2020 1442   TRIG 51 07/14/2020 1442   HDL 57 07/14/2020 1442   CHOLHDL 2.9  07/14/2020 1442   LDLCALC 100 (H) 07/14/2020 1442    Physical Exam:    VS:  BP 128/70   Pulse 74   Ht 5\' 2"  (1.575 m)   Wt 165 lb 12.8 oz (75.2 kg)   SpO2 93%   BMI 30.33 kg/m     Wt Readings from Last 3 Encounters:  08/04/20 165 lb 12.8 oz (75.2 kg)  08/02/20 166 lb 9.6 oz (75.6 kg)  07/22/20 169 lb 12.1 oz (77 kg)     GEN: Well nourished, well developed in no acute distress HEENT: Normal NECK: No JVD; No carotid bruits LYMPHATICS: No lymphadenopathy CARDIAC: S1S2 noted,RRR, no murmurs, rubs, gallops RESPIRATORY:  Clear to auscultation without rales, wheezing or rhonchi  ABDOMEN: Soft, non-tender, non-distended, +bowel sounds, no guarding. EXTREMITIES: No edema, No cyanosis, no clubbing MUSCULOSKELETAL:  No deformity  SKIN: Warm and dry NEUROLOGIC:  Alert and oriented  x 3, non-focal PSYCHIATRIC:  Normal affect, good insight  ASSESSMENT:    1. Chest pain of uncertain etiology   2. Dyspnea on exertion   3. Coronary artery calcification seen on CAT scan   4. Smoker   5. Mixed hyperlipidemia   6. Essential hypertension    PLAN:     Despite her negative stress test her symptoms persist and is worsening like to proceed with definitive diagnostic testing this patient.  Coronary CTA will be appropriate at this time.  She has no IV contrast allergy.  She is willing to proceed with this testing.  She is worried about transportation for our care navigation team is significant help the patient with transportation to her testing.  Blood pressure is acceptable, continue with current antihypertensive regimen.  Hyperlipidemia - continue with current statin medication.  The patient understands the need to lose weight with diet and exercise. We have discussed specific strategies for this.  Smoking cessation advised  The patient is in agreement with the above plan. The patient left the office in stable condition.  The patient will follow up in 3 months or sooner if needed.     Medication Adjustments/Labs and Tests Ordered: Current medicines are reviewed at length with the patient today.  Concerns regarding medicines are outlined above.  Orders Placed This Encounter  Procedures  . Basic metabolic panel  . Magnesium   No orders of the defined types were placed in this encounter.   Patient Instructions  Medication Instructions:  Your physician recommends that you continue on your current medications as directed. Please refer to the Current Medication list given to you today.  *If you need a refill on your cardiac medications before your next appointment, please call your pharmacy*   Lab Work: Your physician recommends that you return for lab work: 3-7 dayd before CT:  BMET, Mag If you have labs (blood work) drawn today and your tests are completely normal, you will receive your results only by: Marland Kitchen MyChart Message (if you have MyChart) OR . A paper copy in the mail If you have any lab test that is abnormal or we need to change your treatment, we will call you to review the results.   Testing/Procedures: Your cardiac CT will be scheduled at:  [facility] [address] [place], [place] [address] [phone]   If scheduled at [facility], please arrive at the [facility] main entrance (entrance A) of [facility] 30 minutes prior to test start time. Proceed to the [facility] Radiology Department (first floor) to check-in and test prep.   Please follow these instructions carefully (unless otherwise directed):   On the Night Before the Test: . Be sure to Drink plenty of water. . Do not consume any caffeinated/decaffeinated beverages or chocolate 12 hours prior to your test. . Do not take any antihistamines 12 hours prior to your test. .  On the Day of the Test: . Drink plenty of water until 1 hour prior to the test. . Do not eat any food 4 hours prior to the test. . You may take your regular medications prior to  the test.  . Take metoprolol (Lopressor) two hours prior to test. . HOLD Furosemide/Hydrochlorothiazide morning of the test. . FEMALES- please wear underwire-free bra if available       After the Test: . Drink plenty of water. . After receiving IV contrast, you may experience a mild flushed feeling. This is normal. . On occasion,  you may experience a mild rash up to 24 hours after the test. This is not dangerous. If this occurs, you can take Benadryl 25 mg and increase your fluid intake. . If you experience trouble breathing, this can be serious. If it is severe call 911 IMMEDIATELY. If it is mild, please call our office. . If you take any of these medications: Glipizide/Metformin, Avandament, Glucavance, please do not take 48 hours after completing test unless otherwise instructed.   Once we have confirmed authorization from your insurance company, we will call you to set up a date and time for your test. Based on how quickly your insurance processes prior authorizations requests, please allow up to 4 weeks to be contacted for scheduling your Cardiac CT appointment. Be advised that routine Cardiac CT appointments could be scheduled as many as 8 weeks after your provider has ordered it.  For non-scheduling related questions, please contact the cardiac imaging nurse navigator should you have any questions/concerns: Marchia Bond, Cardiac Imaging Nurse Navigator Gordy Clement, Cardiac Imaging Nurse Navigator [facility] Direct Office Dial: [phone]   For scheduling needs, including cancellations and rescheduling, please call [place], [phone].     Follow-Up: At [facility], you and your health needs are our priority.  As part of our continuing mission to provide you with exceptional heart care, we have created designated Provider Care Teams.  These Care Teams include your primary Cardiologist (physician) and Advanced Practice Providers (APPs -   Physician Assistants and Nurse Practitioners) who all work together to provide you with the care you need, when you need it.  We recommend signing up for the patient portal called "MyChart".  Sign up information is provided on this After Visit Summary.  MyChart is used to connect with patients for Virtual Visits (Telemedicine).  Patients are able to view lab/test results, encounter notes, upcoming appointments, etc.  Non-urgent messages can be sent to your provider as well.   To learn more about what you can do with MyChart, go to [email or web address].    Your next appointment:   3 month(s)  The format for your next appointment:   In Person  Provider:   Berniece Salines, DO   Other Instructions      Adopting a Healthy Lifestyle.  Know what a healthy weight is for you (roughly BMI <25) and aim to maintain this   Aim for 7+ servings of fruits and vegetables daily   65-80+ fluid ounces of water or unsweet tea for healthy kidneys   Limit to max 1 drink of alcohol per day; avoid smoking/tobacco   Limit animal fats in diet for cholesterol and heart health - choose grass fed whenever available   Avoid highly processed foods, and foods high in saturated/trans fats   Aim for low stress - take time to unwind and care for your mental health   Aim for 150 min of moderate intensity exercise weekly for heart health, and weights twice weekly for bone health   Aim for 7-9 hours of sleep daily   When it comes to diets, agreement about the perfect plan isnt easy to find, even among the experts. Experts at the [employer] developed an idea known as the Healthy Eating Plate. Just imagine a plate divided into logical, healthy portions.   The emphasis is on diet quality:   Load up on vegetables and fruits - one-half of your plate: Aim for color and variety, and remember that potatoes dont count.  Go for whole grains - one-quarter of your plate: Whole wheat, barley, wheat  berries, quinoa, oats, brown rice, and foods made with them. If you want pasta, go with whole wheat pasta.   Protein power - one-quarter of your plate: Fish, chicken, beans, and nuts are all healthy, versatile protein sources. Limit red meat.   The diet, however, does go beyond the plate, offering a few other suggestions.   Use healthy plant oils, such as olive, canola, soy, corn, sunflower and peanut. Check the labels, and avoid partially hydrogenated oil, which have unhealthy trans fats.   If youre thirsty, drink water. Coffee and tea are good in moderation, but skip sugary drinks and limit milk and dairy products to one or two daily servings.   The type of carbohydrate in the diet is more important than the amount. Some sources of carbohydrates, such as vegetables, fruits, whole grains, and beans-are healthier than others.   Finally, stay active  Signed, Berniece Salines, DO  08/04/2020 3:27 PM    [facility]

## 2020-08-04 NOTE — Patient Instructions (Signed)
Medication Instructions:  Your physician recommends that you continue on your current medications as directed. Please refer to the Current Medication list given to you today.  *If you need a refill on your cardiac medications before your next appointment, please call your pharmacy*   Lab Work: Your physician recommends that you return for lab work: 3-7 dayd before CT:  BMET, Mag If you have labs (blood work) drawn today and your tests are completely normal, you will receive your results only by: Marland Kitchen MyChart Message (if you have MyChart) OR . A paper copy in the mail If you have any lab test that is abnormal or we need to change your treatment, we will call you to review the results.   Testing/Procedures: Your cardiac CT will be scheduled at:  [facility] [address] [place], [place] [address] [phone]   If scheduled at [facility], please arrive at the [facility] main entrance (entrance A) of [facility] 30 minutes prior to test start time. Proceed to the [facility] Radiology Department (first floor) to check-in and test prep.   Please follow these instructions carefully (unless otherwise directed):   On the Night Before the Test: . Be sure to Drink plenty of water. . Do not consume any caffeinated/decaffeinated beverages or chocolate 12 hours prior to your test. . Do not take any antihistamines 12 hours prior to your test. .  On the Day of the Test: . Drink plenty of water until 1 hour prior to the test. . Do not eat any food 4 hours prior to the test. . You may take your regular medications prior to the test.  . Take metoprolol (Lopressor) two hours prior to test. . HOLD Furosemide/Hydrochlorothiazide morning of the test. . FEMALES- please wear underwire-free bra if available       After the Test: . Drink plenty of water. . After receiving IV contrast, you may experience a mild flushed feeling. This is normal. . On occasion, you  may experience a mild rash up to 24 hours after the test. This is not dangerous. If this occurs, you can take Benadryl 25 mg and increase your fluid intake. . If you experience trouble breathing, this can be serious. If it is severe call 911 IMMEDIATELY. If it is mild, please call our office. . If you take any of these medications: Glipizide/Metformin, Avandament, Glucavance, please do not take 48 hours after completing test unless otherwise instructed.   Once we have confirmed authorization from your insurance company, we will call you to set up a date and time for your test. Based on how quickly your insurance processes prior authorizations requests, please allow up to 4 weeks to be contacted for scheduling your Cardiac CT appointment. Be advised that routine Cardiac CT appointments could be scheduled as many as 8 weeks after your provider has ordered it.  For non-scheduling related questions, please contact the cardiac imaging nurse navigator should you have any questions/concerns: Marchia Bond, Cardiac Imaging Nurse Navigator Gordy Clement, Cardiac Imaging Nurse Navigator [facility] Direct Office Dial: [phone]   For scheduling needs, including cancellations and rescheduling, please call [place], [phone].     Follow-Up: At [facility], you and your health needs are our priority.  As part of our continuing mission to provide you with exceptional heart care, we have created designated Provider Care Teams.  These Care Teams include your primary Cardiologist (physician) and Advanced Practice Providers (APPs -  Physician  Assistants and Nurse Practitioners) who all work together to provide you with the care you need, when you need it.  We recommend signing up for the patient portal called "MyChart".  Sign up information is provided on this After Visit Summary.  MyChart is used to connect with patients for Virtual Visits (Telemedicine).  Patients are able  to view lab/test results, encounter notes, upcoming appointments, etc.  Non-urgent messages can be sent to your provider as well.   To learn more about what you can do with MyChart, go to [email or web address].    Your next appointment:   3 month(s)  The format for your next appointment:   In Person  Provider:   Berniece Salines, DO   Other Instructions

## 2020-08-04 NOTE — Telephone Encounter (Signed)
CSW received referral to assist patient with transportation needs. CSW attempted to reach patient with no success and unable to leave message. CSW sent enrollment form to transportation services and will continue to try and reach patient to inform of transport options. Raquel Sarna, [place], [place]

## 2020-08-05 DIAGNOSIS — I2510 Atherosclerotic heart disease of native coronary artery without angina pectoris: Secondary | ICD-10-CM | POA: Diagnosis not present

## 2020-08-05 DIAGNOSIS — K5730 Diverticulosis of large intestine without perforation or abscess without bleeding: Secondary | ICD-10-CM

## 2020-08-05 DIAGNOSIS — I119 Hypertensive heart disease without heart failure: Secondary | ICD-10-CM | POA: Diagnosis not present

## 2020-08-05 DIAGNOSIS — I714 Abdominal aortic aneurysm, without rupture: Secondary | ICD-10-CM

## 2020-08-05 DIAGNOSIS — M5030 Other cervical disc degeneration, unspecified cervical region: Secondary | ICD-10-CM

## 2020-08-05 DIAGNOSIS — F419 Anxiety disorder, unspecified: Secondary | ICD-10-CM

## 2020-08-05 DIAGNOSIS — I700 Atherosclerosis of aorta: Secondary | ICD-10-CM

## 2020-08-05 DIAGNOSIS — F32A Depression, unspecified: Secondary | ICD-10-CM

## 2020-08-05 DIAGNOSIS — M47814 Spondylosis without myelopathy or radiculopathy, thoracic region: Secondary | ICD-10-CM | POA: Diagnosis not present

## 2020-08-05 DIAGNOSIS — M47816 Spondylosis without myelopathy or radiculopathy, lumbar region: Secondary | ICD-10-CM

## 2020-08-05 DIAGNOSIS — N200 Calculus of kidney: Secondary | ICD-10-CM

## 2020-08-05 DIAGNOSIS — F17210 Nicotine dependence, cigarettes, uncomplicated: Secondary | ICD-10-CM

## 2020-08-05 DIAGNOSIS — J441 Chronic obstructive pulmonary disease with (acute) exacerbation: Secondary | ICD-10-CM | POA: Diagnosis not present

## 2020-08-05 DIAGNOSIS — G8929 Other chronic pain: Secondary | ICD-10-CM | POA: Diagnosis not present

## 2020-08-05 DIAGNOSIS — K449 Diaphragmatic hernia without obstruction or gangrene: Secondary | ICD-10-CM

## 2020-08-06 ENCOUNTER — Telehealth: Payer: Self-pay | Admitting: Licensed Clinical Social Worker

## 2020-08-06 NOTE — Telephone Encounter (Signed)
CSW spoke with patient to discuss [facility] transport program and that she has been enrolled in the program. Patient provided contact information and verbalizes understanding and will call if needed. Raquel Sarna, [place], [place]

## 2020-08-10 DIAGNOSIS — I2510 Atherosclerotic heart disease of native coronary artery without angina pectoris: Secondary | ICD-10-CM | POA: Diagnosis not present

## 2020-08-10 DIAGNOSIS — G8929 Other chronic pain: Secondary | ICD-10-CM | POA: Diagnosis not present

## 2020-08-10 DIAGNOSIS — I119 Hypertensive heart disease without heart failure: Secondary | ICD-10-CM | POA: Diagnosis not present

## 2020-08-10 DIAGNOSIS — I714 Abdominal aortic aneurysm, without rupture: Secondary | ICD-10-CM | POA: Diagnosis not present

## 2020-08-10 DIAGNOSIS — J441 Chronic obstructive pulmonary disease with (acute) exacerbation: Secondary | ICD-10-CM | POA: Diagnosis not present

## 2020-08-11 DIAGNOSIS — J441 Chronic obstructive pulmonary disease with (acute) exacerbation: Secondary | ICD-10-CM | POA: Diagnosis not present

## 2020-08-11 DIAGNOSIS — I119 Hypertensive heart disease without heart failure: Secondary | ICD-10-CM | POA: Diagnosis not present

## 2020-08-11 DIAGNOSIS — I2510 Atherosclerotic heart disease of native coronary artery without angina pectoris: Secondary | ICD-10-CM | POA: Diagnosis not present

## 2020-08-11 DIAGNOSIS — I714 Abdominal aortic aneurysm, without rupture: Secondary | ICD-10-CM | POA: Diagnosis not present

## 2020-08-11 DIAGNOSIS — G8929 Other chronic pain: Secondary | ICD-10-CM | POA: Diagnosis not present

## 2020-08-12 ENCOUNTER — Telehealth: Payer: Self-pay | Admitting: Licensed Clinical Social Worker

## 2020-08-12 DIAGNOSIS — G8929 Other chronic pain: Secondary | ICD-10-CM | POA: Diagnosis not present

## 2020-08-12 DIAGNOSIS — I119 Hypertensive heart disease without heart failure: Secondary | ICD-10-CM | POA: Diagnosis not present

## 2020-08-12 DIAGNOSIS — I2510 Atherosclerotic heart disease of native coronary artery without angina pectoris: Secondary | ICD-10-CM | POA: Diagnosis not present

## 2020-08-12 DIAGNOSIS — I714 Abdominal aortic aneurysm, without rupture: Secondary | ICD-10-CM | POA: Diagnosis not present

## 2020-08-12 DIAGNOSIS — J441 Chronic obstructive pulmonary disease with (acute) exacerbation: Secondary | ICD-10-CM | POA: Diagnosis not present

## 2020-08-12 NOTE — Telephone Encounter (Signed)
LCSW has arranged ride for CT on 3/29 at 2:30pm. Pt has already been enrolled in [employer]  I attempted to call and update pt, no answer at [phone].  I will attempt to reach pt again as able, I will also mail her the appointment information and a Transportation Services card for her wallet.   Westley Hummer, MSW, [place]  [phone]

## 2020-08-13 ENCOUNTER — Telehealth: Payer: Self-pay | Admitting: Legal Medicine

## 2020-08-13 ENCOUNTER — Telehealth: Payer: Self-pay | Admitting: Licensed Clinical Social Worker

## 2020-08-13 NOTE — Telephone Encounter (Signed)
   [name] [name] DOB: 07/22/1945 MRN: [id number]   RIDER WAIVER AND RELEASE OF LIABILITY  For purposes of improving physical access to our facilities, [facility] is pleased to partner with third parties to provide [facility] patients or other authorized individuals the option of convenient, on-demand ground transportation services (the [employer]") through use of the technology service that enables users to request on-demand ground transportation from independent third-party providers.  By opting to use and accept these [employer], I, the undersigned, hereby agree on behalf of myself, and on behalf of any minor child using the [employer] for whom I am the parent or legal guardian, as follows:  1. Government social research officer provided to me are provided by independent third-party transportation providers who are not [employer] or employees and who are unaffiliated with [employer]. 2. [facility] is neither a transportation carrier nor a common or public carrier. 3. [facility] has no control over the quality or safety of the transportation that occurs as a result of the [employer]. 4. [facility] cannot guarantee that any third-party transportation provider will complete any arranged transportation service. 5. [facility] makes no representation, warranty, or guarantee regarding the reliability, timeliness, quality, safety, suitability, or availability of any of the Transport Services or that they will be error free. 6. I fully understand that traveling by vehicle involves risks and dangers of serious bodily injury, including permanent disability, paralysis, and death. I agree, on behalf of myself and on behalf of any minor child using the Transport Services for whom I am the parent or legal guardian, that the entire risk arising out of my use of the [employer] remains solely with me, to the maximum extent permitted under applicable law. 7. The [employer]  are provided "as is" and "as available." [facility] disclaims all representations and warranties, express, implied or statutory, not expressly set out in these terms, including the implied warranties of merchantability and fitness for a particular purpose. 8. I hereby waive and release [facility], its agents, employees, officers, directors, representatives, insurers, attorneys, assigns, successors, subsidiaries, and affiliates from any and all past, present, or future claims, demands, liabilities, actions, causes of action, or suits of any kind directly or indirectly arising from acceptance and use of the [employer]. 9. I further waive and release [facility] and its affiliates from all present and future liability and responsibility for any injury or death to persons or damages to property caused by or related to the use of the [employer]. 10. I have read this Waiver and Release of Liability, and I understand the terms used in it and their legal significance. This Waiver is freely and voluntarily given with the understanding that my right (as well as the right of any minor child for whom I am the parent or legal guardian using the [employer]) to legal recourse against  in connection with the [employer] is knowingly surrendered in return for use of these services.   I attest that I read the consent document to [name] [name], gave Ms. Devincent the opportunity to ask questions and answered the questions asked (if any). I affirm that [name] [name] then provided consent for she's participation in this program.     [name] [name]

## 2020-08-13 NOTE — Telephone Encounter (Signed)
LCSW received call from [employer] confirming they have enrolled pt and she has a ride to upcoming [place]. They shared pt thinks she has an appointment on 3/23. I was able to confirm w/ [facility] office that pt does not have another appt there until 6/23. Attempted to call pt and let her know, no answer and unable to leave message due to full voicemail. Sent text message letting her know I was trying to reach her.  The [employer] back and confirmed pt only in need of upcoming ride to [place] at this time.   Westley Hummer, MSW, [place]  [phone]

## 2020-08-17 DIAGNOSIS — I2510 Atherosclerotic heart disease of native coronary artery without angina pectoris: Secondary | ICD-10-CM | POA: Diagnosis not present

## 2020-08-17 DIAGNOSIS — J441 Chronic obstructive pulmonary disease with (acute) exacerbation: Secondary | ICD-10-CM | POA: Diagnosis not present

## 2020-08-17 DIAGNOSIS — I119 Hypertensive heart disease without heart failure: Secondary | ICD-10-CM | POA: Diagnosis not present

## 2020-08-17 DIAGNOSIS — I714 Abdominal aortic aneurysm, without rupture: Secondary | ICD-10-CM | POA: Diagnosis not present

## 2020-08-17 DIAGNOSIS — G8929 Other chronic pain: Secondary | ICD-10-CM | POA: Diagnosis not present

## 2020-08-19 DIAGNOSIS — J441 Chronic obstructive pulmonary disease with (acute) exacerbation: Secondary | ICD-10-CM | POA: Diagnosis not present

## 2020-08-19 DIAGNOSIS — I714 Abdominal aortic aneurysm, without rupture: Secondary | ICD-10-CM | POA: Diagnosis not present

## 2020-08-19 DIAGNOSIS — G8929 Other chronic pain: Secondary | ICD-10-CM | POA: Diagnosis not present

## 2020-08-19 DIAGNOSIS — I119 Hypertensive heart disease without heart failure: Secondary | ICD-10-CM | POA: Diagnosis not present

## 2020-08-19 DIAGNOSIS — I2510 Atherosclerotic heart disease of native coronary artery without angina pectoris: Secondary | ICD-10-CM | POA: Diagnosis not present

## 2020-08-20 ENCOUNTER — Telehealth (HOSPITAL_COMMUNITY): Payer: Self-pay | Admitting: *Deleted

## 2020-08-20 DIAGNOSIS — R079 Chest pain, unspecified: Secondary | ICD-10-CM | POA: Diagnosis not present

## 2020-08-20 DIAGNOSIS — R0600 Dyspnea, unspecified: Secondary | ICD-10-CM | POA: Diagnosis not present

## 2020-08-20 NOTE — Telephone Encounter (Signed)
Attempted to call patient regarding upcoming cardiac CT appointment. Mailbox was full and therefore unable to leave a message.  Gordy Clement RN Navigator Cardiac Imaging [facility] [phone] Office [phone] Cell

## 2020-08-21 LAB — BASIC METABOLIC PANEL
BUN/Creatinine Ratio: 15 (ref 12–28)
BUN: 11 mg/dL (ref 8–27)
CO2: 26 mmol/L (ref 20–29)
Calcium: 9.7 mg/dL (ref 8.7–10.3)
Chloride: 98 mmol/L (ref 96–106)
Creatinine, Ser: 0.73 mg/dL (ref 0.57–1.00)
Glucose: 82 mg/dL (ref 65–99)
Potassium: 3.8 mmol/L (ref 3.5–5.2)
Sodium: 138 mmol/L (ref 134–144)
eGFR: 86 mL/min/{1.73_m2} (ref >59)

## 2020-08-21 LAB — MAGNESIUM: Magnesium: 2 mg/dL (ref 1.6–2.3)

## 2020-08-23 ENCOUNTER — Other Ambulatory Visit (HOSPITAL_COMMUNITY): Payer: Self-pay | Admitting: *Deleted

## 2020-08-23 MED ORDER — METOPROLOL TARTRATE 100 MG PO TABS
ORAL_TABLET | ORAL | 0 refills | Status: DC
Start: 2020-08-23 — End: 2020-09-03

## 2020-08-24 ENCOUNTER — Ambulatory Visit (HOSPITAL_COMMUNITY)
Admission: RE | Admit: 2020-08-24 | Discharge: 2020-08-24 | Disposition: A | Discharge status: Home / Self Care | Payer: Medicare Other | Source: Ambulatory Visit | Attending: Cardiology | Admitting: Cardiology

## 2020-08-24 ENCOUNTER — Other Ambulatory Visit: Payer: Self-pay

## 2020-08-24 DIAGNOSIS — R079 Chest pain, unspecified: Secondary | ICD-10-CM | POA: Diagnosis not present

## 2020-08-24 DIAGNOSIS — I700 Atherosclerosis of aorta: Secondary | ICD-10-CM | POA: Diagnosis not present

## 2020-08-24 DIAGNOSIS — R072 Precordial pain: Secondary | ICD-10-CM | POA: Diagnosis not present

## 2020-08-24 DIAGNOSIS — R0602 Shortness of breath: Secondary | ICD-10-CM | POA: Diagnosis not present

## 2020-08-24 MED ORDER — IOHEXOL 350 MG/ML SOLN
80.0000 mL | Freq: Once | INTRAVENOUS | Status: AC | PRN
  Administered 2020-08-24: 80 mL via INTRAVENOUS

## 2020-08-24 MED ORDER — NITROGLYCERIN 0.4 MG SL SUBL
SUBLINGUAL_TABLET | SUBLINGUAL | Status: AC
  Filled 2020-08-24: qty 2

## 2020-08-24 MED ORDER — NITROGLYCERIN 0.4 MG SL SUBL
0.8000 mg | SUBLINGUAL_TABLET | Freq: Once | SUBLINGUAL | Status: AC
  Administered 2020-08-24: 0.8 mg via SUBLINGUAL

## 2020-08-26 DIAGNOSIS — I119 Hypertensive heart disease without heart failure: Secondary | ICD-10-CM | POA: Diagnosis not present

## 2020-08-26 DIAGNOSIS — I714 Abdominal aortic aneurysm, without rupture: Secondary | ICD-10-CM | POA: Diagnosis not present

## 2020-08-26 DIAGNOSIS — J441 Chronic obstructive pulmonary disease with (acute) exacerbation: Secondary | ICD-10-CM | POA: Diagnosis not present

## 2020-08-26 DIAGNOSIS — I2510 Atherosclerotic heart disease of native coronary artery without angina pectoris: Secondary | ICD-10-CM | POA: Diagnosis not present

## 2020-08-26 DIAGNOSIS — G8929 Other chronic pain: Secondary | ICD-10-CM | POA: Diagnosis not present

## 2020-08-27 NOTE — Progress Notes (Signed)
Chronic Care Management Pharmacy Note  09/03/2020 Name:  [name] [name] MRN:  [id number] DOB:  1945-10-21   Plan Recommendations:   Please consider rechecking vitamin D with next labs. Patient states she completed high dose vitamin D regimen but has not continued to supplement.   Patient has not had a dexa scan in some time. She reports if it can be ordered at a [facility] facility then she has access to transportation.   Subjective: [name] [name] is an 75 y.o. year old female who is a primary patient of Henrene Pastor, Zeb Comfort, MD.  The CCM team was consulted for assistance with disease management and care coordination needs.    Engaged with patient by telephone for initial visit in response to provider referral for pharmacy case management and/or care coordination services.   Consent to Services:  The patient was given the following information about Chronic Care Management services today, agreed to services, and gave verbal consent: 1. CCM service includes personalized support from designated clinical staff supervised by the primary care provider, including individualized plan of care and coordination with other care providers 2. 24/7 contact phone numbers for assistance for urgent and routine care needs. 3. Service will only be billed when office clinical staff spend 20 minutes or more in a month to coordinate care. 4. Only one practitioner may furnish and bill the service in a calendar month. 5.The patient may stop CCM services at any time (effective at the end of the month) by phone call to the office staff. 6. The patient will be responsible for cost sharing (co-pay) of up to 20% of the service fee (after annual deductible is met). Patient agreed to services and consent obtained.  Patient Care Team: Lillard Anes, MD as PCP - General (Family Medicine) Berniece Salines, DO as PCP - Cardiology (Cardiology) Burnice Logan, [facility] as Pharmacist (Pharmacist)  Recent office  visits: 07/14/2020 - CBC normal, kidney and liver tests normal, A1c 5.1 OK, LDL cholesterol 100.  04/15/2020 - depression and grief.  03/30/2020 - gastroenteritis - promethazine and depression.  Recent consult visits: 08/04/2020 - cardiology - symptoms persist recommend proceeding with Coronary CTA. Smoking cessation available.  08/02/2020 - Pulmonology - full set of PFTs at follow-up. Continue Breztri and albuterol.  Hospital visits:  07/22/2020 - ed to hospital admission for chest pain.  03/20/2020 - ED visit    Objective:  Lab Results  Component Value Date   CREATININE 0.73 08/20/2020   BUN 11 08/20/2020   GFRNONAA >60 07/22/2020   GFRAA 100 07/14/2020   NA 138 08/20/2020   K 3.8 08/20/2020   CALCIUM 9.7 08/20/2020   CO2 26 08/20/2020   GLUCOSE 82 08/20/2020    Lab Results  Component Value Date/Time   HGBA1C 5.1 07/14/2020 02:42 PM   HGBA1C 5.1 08/11/2019 10:44 AM    Last diabetic Eye exam: No results found for: HMDIABEYEEXA  Last diabetic Foot exam: No results found for: HMDIABFOOTEX   Lab Results  Component Value Date   CHOL 167 07/14/2020   HDL 57 07/14/2020   LDLCALC 100 (H) 07/14/2020   TRIG 51 07/14/2020   CHOLHDL 2.9 07/14/2020    Hepatic Function Latest Ref Rng & Units 07/22/2020 07/14/2020 08/11/2019  Total Protein 6.5 - 8.1 g/dL 6.2(L) 6.6 6.4  Albumin 3.5 - 5.0 g/dL 3.6 4.1 4.1  AST 15 - 41 U/L _0 ALT 0 - 44 U/L _1 Alk Phosphatase 38 - 126 U/L  64 95 88  Total Bilirubin 0.3 - 1.2 mg/dL 0.6 0.4 0.3    No results found for: TSH, FREET4  CBC Latest Ref Rng & Units 07/22/2020 07/22/2020 07/14/2020  WBC 3.4 - 10.8 x10E3/uL - - 6.2  Hemoglobin 12.0 - 15.0 g/dL 13.3 13.6 13.7  Hematocrit 36.0 - 46.0 % 39.0 40.0 41.9  Platelets 150 - 450 x10E3/uL - - 162    Lab Results  Component Value Date/Time   VD25OH 17.3 (L) 12/10/2019 03:34 PM    Clinical ASCVD: No  The 10-year ASCVD risk score Mikey Bussing DC Jr., et al., 2013) is: 50.2%   Values used to  calculate the score:     Age: 75 years     Sex: Female     Is Non-Hispanic African American: No     Diabetic: Yes     Tobacco smoker: Yes     Systolic Blood Pressure: 470 mmHg     Is BP treated: Yes     HDL Cholesterol: 57 mg/dL     Total Cholesterol: 167 mg/dL    Depression screen [facility] 2/9 09/03/2020 07/14/2020 04/15/2020  Decreased Interest _0 Down, Depressed, Hopeless _1 PHQ - 2 Score _2 Altered sleeping _3 Tired, decreased energy _4 Change in appetite _5 Feeling bad or failure about yourself  _6 Trouble concentrating _7 Moving slowly or fidgety/restless 1 1 -  Suicidal thoughts 0 0 0  PHQ-9 Score _8 Difficult doing work/chores Very difficult Very difficult Extremely dIfficult     Social History   Tobacco Use  Smoking Status Current Every Day Smoker  . Packs/day: 1.50  . Years: 47.00  . Pack years: 70.50  . Types: Cigarettes  Smokeless Tobacco Never Used  Tobacco Comment   down to 1 pack a day   BP Readings from Last 3 Encounters:  08/31/20 (!) 138/96  08/24/20 123/64  08/04/20 128/70   Pulse Readings from Last 3 Encounters:  08/31/20 66  08/24/20 (!) 54  08/04/20 74   Wt Readings from Last 3 Encounters:  08/31/20 170 lb 8 oz (77.3 kg)  08/04/20 165 lb 12.8 oz (75.2 kg)  08/02/20 166 lb 9.6 oz (75.6 kg)   BMI Readings from Last 3 Encounters:  08/31/20 31.18 kg/m  08/04/20 30.33 kg/m  08/02/20 30.47 kg/m    Assessment/Interventions: Review of patient past medical history, allergies, medications, health status, including review of consultants reports, laboratory and other test data, was performed as part of comprehensive evaluation and provision of chronic care management services.   SDOH:  (Social Determinants of Health) assessments and interventions performed: Yes SDOH Interventions   Flowsheet Row Most Recent Value  SDOH Interventions   Depression Interventions/Treatment  Medication     SDOH Screenings    Alcohol Screen: Not on file  Depression (PHQ2-9): Medium Risk  . PHQ-2 Score: 13  Financial Resource Strain: Not on file  Food Insecurity: No Food Insecurity  . Worried About Charity fundraiser in the Last Year: Never true  . Ran Out of Food in the Last Year: Never true  Housing: Low Risk   . Last Housing Risk Score: 0  Physical Activity: Not on file  Social Connections: Not on file  Stress: Not on file  Tobacco Use: High Risk  . Smoking Tobacco Use: Current Every Day Smoker  . Smokeless Tobacco Use: Never Used  Transportation Needs: Not on file    CCM Care Plan  Allergies  Allergen Reactions  . Meperidine Anaphylaxis and Other (See Comments)    Blood pressure dropped, also  . Meperidine Hcl Anaphylaxis and Other (See Comments)    B/P dropped, also  . Influenza Vaccines Other (See Comments)    Flu-like symptoms  . Other Rash and Other (See Comments)    asprage    Medications Reviewed Today    Reviewed by Burnice Logan, [facility] (Pharmacist) on 09/03/20 at 0900  Med List Status: <None>  Medication Order Taking? Sig Documenting Provider Last Dose Status Informant  albuterol (PROVENTIL) (2.5 MG/3ML) 0.083% nebulizer solution [id number] Yes Take 3 mLs (2.5 mg total) by nebulization every 6 (six) hours as needed for wheezing or shortness of breath. Spero Geralds, MD Taking Active Multiple Informants  albuterol (VENTOLIN HFA) 108 (90 Base) MCG/ACT inhaler [id number] Yes INHALE 2 PUFFS INTO THE LUNGS EVERY 6 HOURS AS NEEDED FOR WHEEZING OR SHORTNESS OF BREATH  Patient taking differently: Inhale 2 puffs into the lungs every 6 (six) hours as needed for wheezing or shortness of breath.   Lillard Anes, MD Taking Active Multiple Informants  aspirin EC 81 MG tablet [id number] Yes Take 1 tablet (81 mg total) by mouth daily. Swallow whole. Berniece Salines, DO Taking Active   Budeson-Glycopyrrol-Formoterol 160-9-4.8 MCG/ACT AERO [id number] Yes Inhale 2 puffs into the lungs in the  morning and at bedtime. Lillard Anes, MD Taking Active Multiple Informants           Med Note Duffy Bruce, Legrand Como   Thu Jul 22, 2020  1:43 PM) Judithann Sauger  buPROPion (WELLBUTRIN XL) 150 MG 24 hr tablet [id number] Yes TAKE 1 TABLET(150 MG) BY MOUTH DAILY  Patient taking differently: Take 150 mg by mouth in the morning.   Lillard Anes, MD Taking Active   cloNIDine (CATAPRES) 0.1 MG tablet [id number] Yes Take 1 tablet (0.1 mg total) by mouth 2 (two) times daily. Lillard Anes, MD Taking Active Multiple Informants  diazepam (VALIUM) 5 MG tablet [id number] Yes Take 1 tablet (5 mg total) by mouth every 12 (twelve) hours as needed for anxiety. Watch for sedation Lillard Anes, MD Taking Active Multiple Informants  diclofenac Sodium (VOLTAREN) 1 % GEL [id number] No Apply 2 g topically 4 (four) times daily.  Patient not taking: Reported on 09/03/2020   Geradine Girt, DO Not Taking Active   lisinopril-hydrochlorothiazide (ZESTORETIC) 20-12.5 MG tablet [id number] Yes Take 2 tablets by mouth in the morning. [provider] Taking Active Multiple Informants  loperamide (IMODIUM) 2 MG capsule [id number] No Take by mouth.  Patient not taking: Reported on 09/03/2020   [provider] Not Taking Active Multiple Informants  nitroGLYCERIN (NITROSTAT) 0.4 MG SL tablet [id number] Yes Place 1 tablet (0.4 mg total) under the tongue every 5 (five) minutes as needed for chest pain. Tobb, Kardie, DO Taking Expired 03/30/20 2359 Multiple Informants  omeprazole (PRILOSEC) 40 MG capsule [id number] Yes Take 1 capsule (40 mg total) by mouth in the morning and at bedtime.  Patient taking differently: Take 40 mg by mouth in the morning.   Geradine Girt, DO Taking Active   oxyCODONE-acetaminophen (PERCOCET) 10-325 MG tablet [id number] Yes Take 1 tablet by mouth every 6 (six) hours as needed for pain. [provider] Taking Active Multiple Informants  promethazine (PHENERGAN) 25 MG  tablet [id number] Yes Take 1 tablet (25 mg total) by mouth every 6 (six) hours as needed for  nausea or vomiting. Lillard Anes, MD Taking Active Multiple Informants  rosuvastatin (CRESTOR) 10 MG tablet [id number] Yes Take 1 tablet (10 mg total) by mouth daily.  Patient taking differently: Take 10 mg by mouth in the morning.   Berniece Salines, DO Taking Active   traZODone (DESYREL) 100 MG tablet [id number] Yes Take 1 tablet (100 mg total) by mouth at bedtime. Geradine Girt, DO Taking Active   Vitamin D, Ergocalciferol, (DRISDOL) 1.25 MG (50000 UNIT) CAPS capsule [id number] No Take 1 capsule (50,000 Units total) by mouth every 7 (seven) days.  Patient not taking: Reported on 09/03/2020   Berniece Salines, DO Not Taking Active Multiple Informants          Patient Active Problem List   Diagnosis Date Noted  . Osteoarthritis   . Enlarged aorta ([place])   . COPD with acute bronchitis ([place]) 07/22/2020  . Syncope   . Acute gastroenteritis 03/30/2020  . Depression, major, single episode, severe ([place]) 03/30/2020  . PAC (premature atrial contraction) 02/03/2020  . PAT (paroxysmal atrial tachycardia) ([place]) 02/03/2020  . Encounter for screening for lung cancer 11/12/2019  . Diastolic dysfunction 78/67/6720  . Chest pain 08/19/2019  . SOB (shortness of breath) 08/19/2019  . Obesity (BMI 30-39.9) 08/19/2019  . Tobacco use 08/19/2019  . Type 2 diabetes mellitus with other specified complication ([place]) 94/70/9628  . Hyperlipidemia 08/07/2019  . Essential hypertension 08/07/2019  . COPD (chronic obstructive pulmonary disease) ([place]) 08/07/2019  . GERD with esophagitis 08/07/2019  . Chest pain at rest 08/07/2019  . Degenerative joint disease involving multiple joints 09/28/2017  . Long-term current use of opiate analgesic 06/25/2017  . Pain of right hip joint 11/22/2015  . Chronic left shoulder pain 09/27/2015  . S/P total knee arthroplasty 02/15/2015  . Primary osteoarthritis of left knee 02/04/2015     Immunization History  Administered Date(s) Administered  . Fluad Quad(high Dose 65+) 03/30/2020  . Influenza, High Dose Seasonal PF 03/28/2018    Conditions to be addressed/monitored:  Hypertension, Hyperlipidemia, Diabetes, GERD, COPD and Depression  Care Plan : [place]  Updates made by Burnice Logan, RPH since 09/03/2020 12:00 AM    Problem: htn, hld, copd   Priority: High  Onset Date: 09/03/2020    Long-Range Goal: Disease Management   Start Date: 09/03/2020  Expected End Date: 09/03/2021  This Visit's Progress: On track  Priority: High  Note:    Interventions: . 1:1 collaboration with Lillard Anes, MD regarding development and update of comprehensive plan of care as evidenced by provider attestation and co-signature . Inter-disciplinary care team collaboration (see longitudinal plan of care) . Comprehensive medication review performed; medication list updated in electronic medical record  Hypertension (BP goal <140/90) -Controlled -Current treatment: . lisinopril-hydrochlorothiazide 20-12.5 mg 2 tablets daily  . Clonidine 0.1 mg bid -Medications previously tried:  amlodipine, hydrochlorothiazide, furosemide,  -Current home readings:  has been good at home. Typically checks weekly.  -Current dietary habits: trying to eat heart healthy and lower sodium.  -Current exercise habits:  has done home PT before. She knows how to do chair exercises and does them at home 1-2 times daily.  -Denies hypotensive/hypertensive symptoms -Educated on BP goals and benefits of medications for prevention of heart attack, stroke and kidney damage; Daily salt intake goal < 2300 mg; Exercise goal of 150 minutes per week; Importance of home blood pressure monitoring; -Counseled to monitor BP at home weekly, document, and provide log at future appointments -Counseled on  diet and exercise extensively Recommended to continue current medication Recommended checking blood  pressure weekly and recording.   Hyperlipidemia: (LDL goal < 100) -Controlled -Current treatment: . Rosuvastatin 10 mg daily  -Medications previously tried: lovastatin, atorvastatin -Current dietary patterns:  trying to abide by heart healthy and lower sodium. Doesn't eat breakfast. [place] with fruit for lunch. Drinking Ensure. Eats oatmeal. Frozen chicken nuggets and fries.  -Current exercise habits: chair exercises 1-2 times daily  -Educated on Cholesterol goals;  Benefits of statin for ASCVD risk reduction; Importance of limiting foods high in cholesterol; Exercise goal of 150 minutes per week; -Counseled on diet and exercise extensively Recommended to continue current medication  COPD (Goal: control symptoms and prevent exacerbations) -Not ideally controlled -Current treatment  . albuterol nebulizer solution 3 mls every 6 hours prn wheezing or shortness of breath . Albuterol inhaler 2 puffs into lungs every 6 hours prn wheezing/shortness of breath . Breztri 160/9/4.8 mcg/act 2 puffs into the lungs am and bedtime  -Medications previously tried: none reported  -Pulmonary function testing: ordered with pulmonology for 10/2020 -Exacerbations requiring treatment in last 6 months: yes -Patient reports consistent use of maintenance inhaler -Frequency of rescue inhaler use: daily with exertion -Counseled on Proper inhaler technique; Benefits of consistent maintenance inhaler use smoking cessation.  -Counseled on diet and exercise extensively Recommended to continue current medication Counseled on smoking cessation. Smoking 1.5 packs per day.   Depression/Anxiety (Goal: manage symptoms of depression and anxiety) -Controlled -Current treatment: . bupropion xl 150 mg daily  . Diazepam 5 mg every 12 hours prn anxiety . Trazodone 100 mg daily at bedtime  -Medications previously tried/failed: [place], sertraline, trazodone -PHQ9: 13 -GAD7: 10 -Educated on Benefits of  medication for symptom control -Counseled on diet and exercise extensively Recommended to continue current medication   GERD (Goal: manage symptoms of GERD) -Not ideally controlled -Current treatment  . Omeprazole 40 mg daily . Promethazine 25 mg every 6 hours prn nausea/vomiting -Medications previously tried:  Linzess, metoclopramide,  -Counseled on diet and exercise extensively Recommended to continue current medication Recommended continuing to follow up with GI specialist and schedule test.   Tobacco use (Goal reduce cigarette use) -Uncontrolled -Previous quit attempts: none reported -Current treatment  . Patient take bupropion for depression and is not ready to quit at this time  -Patient smokes Within 30 minutes of waking -Patient triggers include: stress, anxiety and sadness and craving the taste of a cigarette and needing to do something with your hands/mouth -On a scale of 1-10, reports MOTIVATION to quit is 0 -On a scale of 1-10, reports CONFIDENCE in quitting is 0 -Provided contact information for [place] Quit Line (1-800-QUIT-NOW) and encouraged patient to reach out to this group for support. -Educated on options when patient is ready. Patient hopes to maybe begin with reducing from 1.5 packs per day. She is not ready to begin cessation at this time and will contact office when she is ready.   Osteoporosis / Osteopenia (Goal minimize risk of breaks or fractures) -Last DEXA Scan:  has had one a long time ago. Needs updated scan completed within [place] to allow for transportation. .    -Current treatment  . Diet  -Medications previously tried:  vitamin d -Recommend [phone] units of vitamin D daily. Recommend 1200 mg of calcium daily from dietary and supplemental sources. Recommend weight-bearing and muscle strengthening exercises for building and maintaining bone density. -Counseled on diet and exercise extensively Recommended updated Dexa scan and Vitamin D  level  drawn with next labs.   Pain sees Dr. Nelva Bush (Goal: manage back pain) -Controlled -Current treatment  . oxycodone-acetaminophen 10/325 mg every 6 hours prn for pain  -Medications previously tried:  celecoxib, ibuprofen, diclofenac gel  -Counseled on diet and exercise extensively Recommended continuing to follow-up with Dr. Nelva Bush.   Health Maintenance -Vaccine gaps: COVID vaccine (patient declines) Tetanus recommended if patient hasn't had in last 10 years. Patient reports having [place] shots.  -Current therapy:  . nitroglycerin 0.4 mg sl under the tongue every 5 minuts prn chest pain  -Patient is satisfied with current therapy and denies issues -Counseled on diet and exercise extensively Recommended to continue current medication   Patient Goals/Self-Care Activities . Patient will:  - take medications as prescribed focus on medication adherence by using packaing and delivery through [facility].  check blood pressure weekly, document, and provide at future appointments target a minimum of 150 minutes of moderate intensity exercise weekly engage in dietary modifications by heart-healthy, low-sodium diet.   Follow Up Plan: Telephone follow up appointment with care management team member scheduled for:02/2021      Medication Assistance: None required.  Patient affirms current coverage meets needs.  Patient's preferred pharmacy is:  [id number], [place] - [address] AT [place] [address] [place] [address] Phone: [phone] Fax: [phone]  Uses pill box? Yes Pt endorses misssed doses or taking later than scheduled.   We discussed: Benefits of medication synchronization, packaging and delivery as well as enhanced pharmacist oversight with [facility]. Patient decided to: Utilize [facility] pharmacy for medication synchronization, packaging and delivery   Verbal consent obtained for [facility] enhanced pharmacy services  (medication synchronization, adherence packaging, delivery coordination). A medication sync plan was created to allow patient to get all medications delivered once every 30 to 90 days per patient preference. Patient understands they have freedom to choose pharmacy and clinical pharmacist will coordinate care between all prescribers and [facility].   Care Plan and Follow Up Patient Decision:  Patient agrees to Care Plan and Follow-up.  Plan: Telephone follow up appointment with care management team member scheduled for:  03/01/2021

## 2020-08-31 ENCOUNTER — Other Ambulatory Visit: Payer: Self-pay

## 2020-08-31 ENCOUNTER — Telehealth: Payer: Self-pay | Admitting: Cardiology

## 2020-08-31 ENCOUNTER — Ambulatory Visit (INDEPENDENT_AMBULATORY_CARE_PROVIDER_SITE_OTHER): Payer: Medicare Other | Admitting: Gastroenterology

## 2020-08-31 ENCOUNTER — Encounter: Payer: Self-pay | Admitting: Gastroenterology

## 2020-08-31 VITALS — BP 138/96 | HR 66 | Ht 62.0 in | Wt 170.5 lb

## 2020-08-31 DIAGNOSIS — G8929 Other chronic pain: Secondary | ICD-10-CM

## 2020-08-31 DIAGNOSIS — Z860100 Personal history of colon polyps, unspecified: Secondary | ICD-10-CM

## 2020-08-31 DIAGNOSIS — R1030 Lower abdominal pain, unspecified: Secondary | ICD-10-CM

## 2020-08-31 DIAGNOSIS — K5909 Other constipation: Secondary | ICD-10-CM | POA: Diagnosis not present

## 2020-08-31 DIAGNOSIS — M549 Dorsalgia, unspecified: Secondary | ICD-10-CM

## 2020-08-31 DIAGNOSIS — R1319 Other dysphagia: Secondary | ICD-10-CM | POA: Diagnosis not present

## 2020-08-31 MED ORDER — ASPIRIN EC 81 MG PO TBEC: 81.0000 mg | DELAYED_RELEASE_TABLET | Freq: Every day | ORAL | 3 refills | Status: DC

## 2020-08-31 MED ORDER — ROSUVASTATIN CALCIUM 10 MG PO TABS: 10.0000 mg | ORAL_TABLET | Freq: Every day | ORAL | 3 refills | Status: DC

## 2020-08-31 NOTE — Progress Notes (Signed)
Chief Complaint: N/V  Referring Provider:  Lillard Anes,*      ASSESSMENT AND PLAN;   #1. Eso dysphagiawith Epi pain with n/v  #2. Lower abdo pain  #3. Chronic back pain followed by pain management (on percocet 1TID)  #4. Chronic constipation likely d/t narcotics.    #4. H/O polyps   Plan:  -Phenergan 25mg  poQ6hrs (30). Sedation precautios -Continue omeprazole 40 mg p.o. once a day. May have to change it to protonix. -EGD with dil.  Explained risks and benefits. -Minimize pain medications.   HPI:    [name] [name] is a 75 y.o. female   Follow-up visit. Seen in ED 07/22/2018 for chest pains and intermittent dysphagia Underwent CTA chest Abdo/pelvis-showed moderate hiatal hernia.  No acute abnormalities otherwise.  Colonic diverticulosis without diverticulitis.  Sent to GI clinic for further evaluation.  She has been previously seen in the GI clinic and was advised to get EGD and colonoscopy performed.  She never scheduled her procedures.  She does complain of intermittent dysphagia mostly to solids-mid chest, occasional heartburn.  No nausea or vomiting.  She has been compliant with omeprazole.  Still has breakthrough symptoms.  Also has mild epigastric discomfort after eating.  No significant abdominal bloating.  She has chronic constipation she takes stool softeners.  She is much better.  She likes to hold off on colonoscopy at the present time.  She understands risks and benefits including small risks of missing colorectal neoplasms.  No sodas, chocolates, chewing gums, artificial sweeteners and candy. No NSAIDs  No jaundice dark urine or pale stools  We got more records from ED at [facility].  Date of visit 03/20/2020 -Normal CBC with hemoglobin 14.4, MCV 90, platelets 210 -Normal CMP with BUN/creatinine 30/0.9 -CT Abdo/pelvis 03/20/2020: Extensive sigmoid diverticulosis without diverticulitis, moderate hiatal hernia, bilateral nonobstructing  nephrolithiasis, degenerative joint disease of lumbar spine multiple level.  Aortic atherosclerosis.   Past GI procedures:  CTA chest Abdo/pelvis on 07/22/2020: -No thoracic or abdominal aortic aneurysm -Colonic diverticulosis without diverticulitis. -Moderate hiatal hernia -Bilateral nonobstructing renal calculi.  CT AP 10/2016: Colonic diverticulosis without diverticulitis, small intrarenal stones, aortic atherosclerosis.  Advanced DJD back.  EGD 08/2015: Schatzki's ring s/p dilatation, small hiatal hernia. Neg eso Bx, neg CLO  Colonoscopy 08/2011 ([facility]) colonic polyps SP polypectomy, incidental small ascending colonic lipoma, pancolonic diverticulosis predominantly in the sigmoid colon. Bx- TA. Rpt in 5 yrs. Refused FU colon 08/31/2020  Past Medical History:  Diagnosis Date  . Chronic left shoulder pain 09/27/2015  . COPD (chronic obstructive pulmonary disease) ([place])   . Enlarged aorta ([place])   . Hyperlipidemia   . Osteoarthritis   . Pain of right hip joint 11/22/2015  . Primary osteoarthritis of left knee 02/04/2015  . S/P total knee arthroplasty 02/15/2015  . SOB (shortness of breath)     Past Surgical History:  Procedure Laterality Date  . ABDOMINAL HYSTERECTOMY    . BREAST BIOPSY Right   . CHOLECYSTECTOMY    . COLONOSCOPY  09/04/2011   Colonic polyps, status post polyectomy. Incidental small ascending colon lipoma  . ESOPHAGOGASTRODUODENOSCOPY  09/08/2015   Schatzki ring status post esophageal dilitation. Small hiatal hernia  . hemorrhoid surgery    . KNEE SURGERY     left  . TOTAL KNEE ARTHROPLASTY Left 02/15/2015   Procedure: TOTAL KNEE ARTHROPLASTY;  Surgeon: Vickey Huger, MD;  Location: [facility];  Service: Orthopedics;  Laterality: Left;  . TUBAL LIGATION    . WRIST SURGERY  right    Family History  Problem Relation Age of Onset  . Heart disease Mother   . Cancer Father        unknown  . Diabetes Father   . Heart disease Father   . Stroke Father   . Lupus Sister    . Seizures Son   . COPD Son     Social History   Tobacco Use  . Smoking status: Current Every Day Smoker    Packs/day: 1.50    Years: 47.00    Pack years: 70.50    Types: Cigarettes  . Smokeless tobacco: Never Used  . Tobacco comment: down to 1 pack a day  Vaping Use  . Vaping Use: Never used  Substance Use Topics  . Alcohol use: No  . Drug use: No    Current Outpatient Medications  Medication Sig Dispense Refill  . albuterol (PROVENTIL) (2.5 MG/3ML) 0.083% nebulizer solution Take 3 mLs (2.5 mg total) by nebulization every 6 (six) hours as needed for wheezing or shortness of breath. 75 mL 12  . albuterol (VENTOLIN HFA) 108 (90 Base) MCG/ACT inhaler INHALE 2 PUFFS INTO THE LUNGS EVERY 6 HOURS AS NEEDED FOR WHEEZING OR SHORTNESS OF BREATH (Patient taking differently: Inhale 2 puffs into the lungs every 6 (six) hours as needed for wheezing or shortness of breath.) 8.5 g 6  . aspirin EC 81 MG tablet Take 1 tablet (81 mg total) by mouth daily. Swallow whole. 90 tablet 3  . Budeson-Glycopyrrol-Formoterol 160-9-4.8 MCG/ACT AERO Inhale 2 puffs into the lungs in the morning and at bedtime. 10.7 g 6  . buPROPion (WELLBUTRIN XL) 150 MG 24 hr tablet TAKE 1 TABLET(150 MG) BY MOUTH DAILY 90 tablet 2  . cloNIDine (CATAPRES) 0.1 MG tablet Take 1 tablet (0.1 mg total) by mouth 2 (two) times daily. 180 tablet 3  . diazepam (VALIUM) 5 MG tablet Take 1 tablet (5 mg total) by mouth every 12 (twelve) hours as needed for anxiety. Watch for sedation 30 tablet 3  . diclofenac Sodium (VOLTAREN) 1 % GEL Apply 2 g topically 4 (four) times daily. 50 g 0  . lisinopril-hydrochlorothiazide (ZESTORETIC) 20-12.5 MG tablet Take 2 tablets by mouth daily.    Marland Kitchen loperamide (IMODIUM) 2 MG capsule Take by mouth.    . metoprolol tartrate (LOPRESSOR) 100 MG tablet Please take 1 tablet (100mg ) 2 hours before cardiac CT. 1 tablet 0  . omeprazole (PRILOSEC) 40 MG capsule Take 1 capsule (40 mg total) by mouth in the morning  and at bedtime. 60 capsule 0  . oxyCODONE-acetaminophen (PERCOCET) 10-325 MG tablet Take 1 tablet by mouth every 6 (six) hours as needed for pain.    . promethazine (PHENERGAN) 25 MG tablet Take 1 tablet (25 mg total) by mouth every 6 (six) hours as needed for nausea or vomiting. 30 tablet 3  . rosuvastatin (CRESTOR) 10 MG tablet Take 1 tablet (10 mg total) by mouth daily. 90 tablet 3  . traZODone (DESYREL) 100 MG tablet Take 1 tablet (100 mg total) by mouth at bedtime.    . Vitamin D, Ergocalciferol, (DRISDOL) 1.25 MG (50000 UNIT) CAPS capsule Take 1 capsule (50,000 Units total) by mouth every 7 (seven) days. 12 capsule 0  . nitroGLYCERIN (NITROSTAT) 0.4 MG SL tablet Place 1 tablet (0.4 mg total) under the tongue every 5 (five) minutes as needed for chest pain. 25 tablet 3   No current facility-administered medications for this visit.    Allergies  Allergen Reactions  .  Meperidine Anaphylaxis and Other (See Comments)    Blood pressure dropped, also  . Meperidine Hcl Anaphylaxis and Other (See Comments)    B/P dropped, also  . Influenza Vaccines Other (See Comments)    Flu-like symptoms  . Other Rash and Other (See Comments)    asprage    Review of Systems:  Constitutional: Denies fever, chills, diaphoresis, appetite change and fatigue.  HEENT: Denies photophobia, eye pain, redness, hearing loss, ear pain, congestion, sore throat, rhinorrhea, sneezing, mouth sores, neck pain, neck stiffness and tinnitus.   Respiratory: Has COPD with continued smoking.  Occ SOB, DOE, cough, chest tightness,  and wheezing.   Cardiovascular: Denies chest pain, palpitations and leg swelling.  Genitourinary: Denies dysuria, urgency, frequency, hematuria, flank pain and difficulty urinating.  Musculoskeletal: Has myalgias, back pain, joint swelling, arthralgias and gait problem.  Skin: No rash.  Neurological: Denies dizziness, seizures, syncope, weakness, light-headedness, numbness and headaches.   Hematological: Denies adenopathy. Easy bruising, personal or family bleeding history  Psychiatric/Behavioral: Has anxiety or depression     Physical Exam:    BP (!) 138/96 (BP Location: Right Arm, Patient Position: Sitting, Cuff Size: Normal)   Pulse 66   Ht 5\' 2"  (1.575 m)   Wt 170 lb 8 oz (77.3 kg)   BMI 31.18 kg/m  Wt Readings from Last 3 Encounters:  08/31/20 170 lb 8 oz (77.3 kg)  08/04/20 165 lb 12.8 oz (75.2 kg)  08/02/20 166 lb 9.6 oz (75.6 kg)   Constitutional:  Well-developed, in no acute distress. Psychiatric: Normal mood and affect. Behavior is normal. HEENT: Pupils normal.  Conjunctivae are normal. No scleral icterus. Neck supple.  Cardiovascular: Normal rate, regular rhythm. No edema Pulmonary/chest: Bilateral decreased breath sounds.  No wheezing, rales or rhonchi. Abdominal: Soft, nondistended.  Generalized abdominal wall tenderness.. Bowel sounds active throughout. There are no masses palpable. No hepatomegaly. Rectal:  defered Neurological: Alert and oriented to person place and time. Skin: Skin is warm and dry. No rashes noted.  Data Reviewed: I have personally reviewed following labs and imaging studies  CBC: CBC Latest Ref Rng & Units 07/22/2020 07/22/2020 07/14/2020  WBC 3.4 - 10.8 x10E3/uL - - 6.2  Hemoglobin 12.0 - 15.0 g/dL 13.3 13.6 13.7  Hematocrit 36.0 - 46.0 % 39.0 40.0 41.9  Platelets 150 - 450 x10E3/uL - - 162    CMP: CMP Latest Ref Rng & Units 08/20/2020 07/22/2020 07/22/2020  Glucose 65 - 99 mg/dL 82 - 103(H)  BUN 8 - 27 mg/dL 11 - 26(H)  Creatinine 0.57 - 1.00 mg/dL 0.73 - 0.80  Sodium 134 - 144 mmol/L 138 137 137  Potassium 3.5 - 5.2 mmol/L 3.8 3.6 3.7  Chloride 96 - 106 mmol/L 98 - 99  CO2 20 - 29 mmol/L 26 - -  Calcium 8.7 - 10.3 mg/dL 9.7 - -  Total Protein 6.5 - 8.1 g/dL - - -  Total Bilirubin 0.3 - 1.2 mg/dL - - -  Alkaline Phos 38 - 126 U/L - - -  AST 15 - 41 U/L - - -  ALT 0 - 44 U/L - - -     Radiology Studies: CT  CORONARY MORPH W/CTA COR W/SCORE W/CA W/CM &/OR WO/CM  Addendum Date: 08/25/2020   ADDENDUM REPORT: 08/25/2020 17:41 CLINICAL DATA:  This is a 74 year old female with chest pain. EXAM: Cardiac/Coronary  CTA TECHNIQUE: The patient was scanned on a [employer]. FINDINGS: A 100 kV prospective scan was triggered in the descending  thoracic aorta at 111 HU's. Axial non-contrast 3 mm slices were carried out through the heart. The data set was analyzed on a dedicated work station and scored using the [place]. Gantry rotation speed was 250 msecs and collimation was .6 mm. No beta blockade and 0.8 mg of sl NTG was given. The 3D data set was reconstructed in 5% intervals of the 67-82 % of the R-R cycle. Diastolic phases were analyzed on a dedicated work station using MPR, MIP and VRT modes. The patient received 80 cc of contrast. Aorta: Normal size. Mild aortic root calcifications. No dissection. Aortic Valve:  Trileaflet.  Mild Calcifications. Coronary Arteries:  Normal coronary origin.  Right dominance. RCA is a large dominant artery that gives rise to PDA and PLA. There is a mild (24-49%) calcified plaque in the RCA. The mid RCA with minimal (<24%) calcified plaque. The distal RCA with no plaque. Left main is a large artery that gives rise to LAD and LCX arteries. LAD is a large vessel. There is a long (13.4 mm) proximal mild calcified lesion. The mid LAD with mild mixed plaque. The distal LAD with no plaques. LCX is a non-dominant artery that gives rise to one large OM1 branch. There is minimal calcified plaque in the LCX. The mid and distal LCX with no plaques. Other findings: Normal pulmonary vein drainage into the left atrium. Normal left atrial appendage without a thrombus. Normal size of the pulmonary artery. IMPRESSION: 1. Coronary calcium score of 509. This was 42 percentile for age and sex matched control. 2. Normal coronary origin with right dominance. 3. Mild CAD. CAD-RADS 2. Mild  non-obstructive CAD (25-49%). Consider non-atherosclerotic causes of chest pain. Consider preventive therapy and risk factor modification. 4. Aortic Atherosclerosis. Electronically Signed   By: Berniece Salines DO   On: 08/25/2020 17:41   Result Date: 08/25/2020 EXAM: OVER-READ INTERPRETATION  CT CHEST The following report is an over-read performed by radiologist Dr. Abigail Miyamoto of [facility], [place] on 08/24/2020. This over-read does not include interpretation of cardiac or coronary anatomy or pathology. The coronary CTA interpretation by the cardiologist is attached. COMPARISON:  07/22/2020 CTA chest FINDINGS: Vascular: Aortic atherosclerosis. Tortuous thoracic aorta. No central pulmonary embolism, on this non-dedicated study. Mediastinum/Nodes: Right-sided mediastinal calcified nodes are likely related to old granulomatous disease. A moderate hiatal hernia. Lungs/Pleura: No pleural fluid.  Clear imaged lungs. Upper Abdomen: Normal imaged portions of the liver, spleen. Musculoskeletal: Thoracic spondylosis. IMPRESSION: 1. No acute findings in the imaged extracardiac chest. 2. Moderate hiatal hernia. 3. Aortic Atherosclerosis (ICD10-I70.0). Electronically Signed: By: Abigail Miyamoto M.D. On: 08/24/2020 15:51      Carmell Austria, MD 08/31/2020, 2:42 PM  Cc: Lillard Anes

## 2020-08-31 NOTE — Patient Instructions (Signed)
If you are age 75 or older, your body mass index should be between 23-30. Your Body mass index is 31.18 kg/m. If this is out of the aforementioned range listed, please consider follow up with your Primary Care Provider.  If you are age 27 or younger, your body mass index should be between 19-25. Your Body mass index is 31.18 kg/m. If this is out of the aformentioned range listed, please consider follow up with your Primary Care Provider.   It has been recommended to you by your physician that you have an EGD completed. Per your request, we did not schedule the procedure(s) today. Please contact our office at [phone] should you decide to have the procedure completed. You will be scheduled for a pre-visit and procedure at that time.  Continue with omeprazole  Minimize pain mediation  Call with any questions of concerns  Thank you,  Dr. Jackquline Denmark

## 2020-08-31 NOTE — Telephone Encounter (Signed)
Patient is returning call to discuss CT results. 

## 2020-09-01 ENCOUNTER — Telehealth: Payer: Self-pay

## 2020-09-01 NOTE — Chronic Care Management (AMB) (Signed)
Chronic Care Management Pharmacy Assistant   Name: [name] [name]  MRN: [id number] DOB: 1945/09/24   Reason for Encounter: Initial call questions    Recent office visits:  07/14/2020: Lillard Anes, MD (PCP)/ No medication changes noted. 04/15/2020: Lillard Anes, MD (PCP)/ No medication changes noted.  03/30/2020: Dr. Lillard Anes, MD (PCP) / Started on Bupropion HCI 150 mg. Tablet: Take 1 tablet by mouth daily.   Recent consult visits:  08/04/2020: Berniece Salines, DO (Cardiology) for chest pain. No medication changes noted.  08/02/2020: Senaida Ores. Shearon Stalls, MD (Pulmonology) for COPD w/ chronic bronchitis and emphysema/ No medication changes noted.     Hospital visits:  07/22/2020:[facility] to Hospital Admission for syncope/ Jessica U. Vann, DO / No medication changes noted 03/20/2020: [facility] ED for abdominal pain/  Charlies Silvers, MD/ No medication changes noted.     Medications: Outpatient Encounter Medications as of 09/01/2020  Medication Sig Note  . albuterol (PROVENTIL) (2.5 MG/3ML) 0.083% nebulizer solution Take 3 mLs (2.5 mg total) by nebulization every 6 (six) hours as needed for wheezing or shortness of breath.   Marland Kitchen albuterol (VENTOLIN HFA) 108 (90 Base) MCG/ACT inhaler INHALE 2 PUFFS INTO THE LUNGS EVERY 6 HOURS AS NEEDED FOR WHEEZING OR SHORTNESS OF BREATH (Patient taking differently: Inhale 2 puffs into the lungs every 6 (six) hours as needed for wheezing or shortness of breath.)   . aspirin EC 81 MG tablet Take 1 tablet (81 mg total) by mouth daily. Swallow whole.   . Budeson-Glycopyrrol-Formoterol 160-9-4.8 MCG/ACT AERO Inhale 2 puffs into the lungs in the morning and at bedtime. 07/22/2020: BREZTRI  . buPROPion (WELLBUTRIN XL) 150 MG 24 hr tablet TAKE 1 TABLET(150 MG) BY MOUTH DAILY   . cloNIDine (CATAPRES) 0.1 MG tablet Take 1 tablet (0.1 mg total) by mouth 2 (two) times daily.   . diazepam (VALIUM) 5 MG tablet Take 1 tablet (5 mg total) by mouth  every 12 (twelve) hours as needed for anxiety. Watch for sedation   . diclofenac Sodium (VOLTAREN) 1 % GEL Apply 2 g topically 4 (four) times daily.   Marland Kitchen lisinopril-hydrochlorothiazide (ZESTORETIC) 20-12.5 MG tablet Take 2 tablets by mouth daily.   Marland Kitchen loperamide (IMODIUM) 2 MG capsule Take by mouth.   . metoprolol tartrate (LOPRESSOR) 100 MG tablet Please take 1 tablet (100mg ) 2 hours before cardiac CT.   . nitroGLYCERIN (NITROSTAT) 0.4 MG SL tablet Place 1 tablet (0.4 mg total) under the tongue every 5 (five) minutes as needed for chest pain.   Marland Kitchen omeprazole (PRILOSEC) 40 MG capsule Take 1 capsule (40 mg total) by mouth in the morning and at bedtime.   Marland Kitchen oxyCODONE-acetaminophen (PERCOCET) 10-325 MG tablet Take 1 tablet by mouth every 6 (six) hours as needed for pain.   . promethazine (PHENERGAN) 25 MG tablet Take 1 tablet (25 mg total) by mouth every 6 (six) hours as needed for nausea or vomiting.   . rosuvastatin (CRESTOR) 10 MG tablet Take 1 tablet (10 mg total) by mouth daily.   . traZODone (DESYREL) 100 MG tablet Take 1 tablet (100 mg total) by mouth at bedtime.   . Vitamin D, Ergocalciferol, (DRISDOL) 1.25 MG (50000 UNIT) CAPS capsule Take 1 capsule (50,000 Units total) by mouth every 7 (seven) days.    No facility-administered encounter medications on file as of 09/01/2020.   Have you seen any other providers since your last visit? Patient stated that she has not seen any other providers since her last  visit.   Any changes in your medications or health? Patient stated that "one of her doctors" wanted her to start taking Asprin 81 mg once a day, but she has been unable to get transportation to go get it. Patient stated that cardiologist was calling in a new prescription for her, but she was unsure of the name of the medication. She plans to pick up new prescription and aspirin when she has transportation.  Any side effects from any medications? Patient stated that she is not having any side  effects from any medications.   Do you have an symptoms or problems not managed by your medications?  Patient stated that Harlan 10/325 that she is taking every 6 hours, is no longer managing her chronic back pain. She stated she has been taking this medication for 6 years. She is under pain contract with Dr. Suella Broad @ [facility].  Any concerns about your health right now?  Patient stated she is concerned about pain management, and her recent episode of syncope that caused her to go to the ED in February.    Has your provider asked that you check blood pressure, blood sugar, or follow special diet at home? Patient stated she does not check blood pressure, blood sugar, or follow any special diet at home.  Are you getting exercise on a daily basis? Patient stated that she has a new walker and she is walking to the mailbox and back, and walking up and down the sidewalk in front of her house. I encouraged her to continue walking as much as possible.   Can you think of a goal you would like to reach for your health? Patient stated that she does not have any goals that she would like to reach for her health.   Do you have any problems getting your medications?  Patient stated that she is often 3 or more days late getting her medications due to lack of transportation.  Is there anything that you would like to discuss during the visit?  I spoke with patient regarding [facility]. Patient stated that she would like to discuss having her medications delivered. Patient also stated that she would like to discuss changing pain medication.  Please bring medications and supplements to appointment  Gaston Islam, [place]

## 2020-09-02 ENCOUNTER — Telehealth: Payer: Self-pay

## 2020-09-02 NOTE — Progress Notes (Signed)
error 

## 2020-09-03 ENCOUNTER — Other Ambulatory Visit: Payer: Self-pay

## 2020-09-03 ENCOUNTER — Ambulatory Visit (INDEPENDENT_AMBULATORY_CARE_PROVIDER_SITE_OTHER): Payer: Medicare Other

## 2020-09-03 DIAGNOSIS — J449 Chronic obstructive pulmonary disease, unspecified: Secondary | ICD-10-CM

## 2020-09-03 DIAGNOSIS — F322 Major depressive disorder, single episode, severe without psychotic features: Secondary | ICD-10-CM

## 2020-09-03 DIAGNOSIS — E1169 Type 2 diabetes mellitus with other specified complication: Secondary | ICD-10-CM

## 2020-09-03 DIAGNOSIS — I10 Essential (primary) hypertension: Secondary | ICD-10-CM | POA: Diagnosis not present

## 2020-09-03 DIAGNOSIS — I700 Atherosclerosis of aorta: Secondary | ICD-10-CM

## 2020-09-06 ENCOUNTER — Telehealth: Payer: Self-pay

## 2020-09-06 NOTE — Progress Notes (Signed)
    Chronic Care Management Pharmacy Assistant   Name: JACQUELINA HEWINS  MRN: [id number] DOB: [birth year]   Reason for Encounter: Medication coordination for [facility] onboarding.   Medications: Outpatient Encounter Medications as of 09/06/2020  Medication Sig Note  . albuterol (PROVENTIL) (2.5 MG/3ML) 0.083% nebulizer solution Take 3 mLs (2.5 mg total) by nebulization every 6 (six) hours as needed for wheezing or shortness of breath.   Marland Kitchen albuterol (VENTOLIN HFA) 108 (90 Base) MCG/ACT inhaler INHALE 2 PUFFS INTO THE LUNGS EVERY 6 HOURS AS NEEDED FOR WHEEZING OR SHORTNESS OF BREATH (Patient taking differently: Inhale 2 puffs into the lungs every 6 (six) hours as needed for wheezing or shortness of breath.)   . aspirin EC 81 MG tablet Take 1 tablet (81 mg total) by mouth daily. Swallow whole.   . Budeson-Glycopyrrol-Formoterol 160-9-4.8 MCG/ACT AERO Inhale 2 puffs into the lungs in the morning and at bedtime. 07/22/2020: BREZTRI  . buPROPion (WELLBUTRIN XL) 150 MG 24 hr tablet TAKE 1 TABLET(150 MG) BY MOUTH DAILY (Patient taking differently: Take 150 mg by mouth in the morning.)   . cloNIDine (CATAPRES) 0.1 MG tablet Take 1 tablet (0.1 mg total) by mouth 2 (two) times daily.   . diazepam (VALIUM) 5 MG tablet Take 1 tablet (5 mg total) by mouth every 12 (twelve) hours as needed for anxiety. Watch for sedation   . diclofenac Sodium (VOLTAREN) 1 % GEL Apply 2 g topically 4 (four) times daily. (Patient not taking: Reported on 09/03/2020)   . lisinopril-hydrochlorothiazide (ZESTORETIC) 20-12.5 MG tablet Take 2 tablets by mouth in the morning.   . loperamide (IMODIUM) 2 MG capsule Take by mouth. (Patient not taking: Reported on 09/03/2020)   . nitroGLYCERIN (NITROSTAT) 0.4 MG SL tablet Place 1 tablet (0.4 mg total) under the tongue every 5 (five) minutes as needed for chest pain.   Marland Kitchen omeprazole (PRILOSEC) 40 MG capsule Take 1 capsule (40 mg total) by mouth in the morning and at bedtime. (Patient taking  differently: Take 40 mg by mouth in the morning.)   . oxyCODONE-acetaminophen (PERCOCET) 10-325 MG tablet Take 1 tablet by mouth every 6 (six) hours as needed for pain.   . promethazine (PHENERGAN) 25 MG tablet Take 1 tablet (25 mg total) by mouth every 6 (six) hours as needed for nausea or vomiting.   . rosuvastatin (CRESTOR) 10 MG tablet Take 1 tablet (10 mg total) by mouth daily. (Patient taking differently: Take 10 mg by mouth in the morning.)   . traZODone (DESYREL) 100 MG tablet Take 1 tablet (100 mg total) by mouth at bedtime.   . Vitamin D, Ergocalciferol, (DRISDOL) 1.25 MG (50000 UNIT) CAPS capsule Take 1 capsule (50,000 Units total) by mouth every 7 (seven) days. (Patient not taking: Reported on 09/03/2020)    No facility-administered encounter medications on file as of 09/06/2020.   Donette Larry, CPP spoke to the patient, she is requesting onboarding to the [facility].   I called the patient and went over her medications.  I have completed the onboarding form and submitted it to Donette Larry, CPP for approval.  I called called [facility] and requested a patient profile transfer.   Clarita Leber, [place] Pharmacist Assistant [phone]

## 2020-09-06 NOTE — Patient Instructions (Addendum)
Visit Information  Goals Addressed            This Visit's Progress   . Learn and Do Breathing Exercises-COPD       Timeframe:  Long-Range Goal Priority:  High Start Date:                             Expected End Date:                       Follow Up Date 03/01/2021    - do breathing exercises every day    Why is this important?    Breathing exercises can help lessen the cough that comes with chronic obstructive pulmonary disease.   Doing the exercises will give you more energy.   They will also help you to control your symptoms.    Notes:     . Learn More About My Health       Timeframe:  Long-Range Goal Priority:  High Start Date:                             Expected End Date:                        Follow Up Date 03/01/2021    - tell my story and reason for my visit - make a list of questions - bring a list of my medicines to the visit    Why is this important?    The best way to learn about your health and care is by talking to the doctor and nurse.   They will answer your questions and give you information in the way that you like best.    Notes:     Marland Kitchen Manage My Medicine       Timeframe:  Long-Range Goal Priority:  High Start Date:                             Expected End Date:                       Follow Up Date 03/01/2021    - call for medicine refill 2 or 3 days before it runs out - keep a list of all the medicines I take; vitamins and herbals too - learn to read medicine labels    Why is this important?   . These steps will help you keep on track with your medicines.   Notes:       Patient Care Plan: CCM Pharmacy Care Plan    Problem Identified: htn, hld, copd   Priority: High  Onset Date: 09/03/2020    Long-Range Goal: Disease Management   Start Date: 09/03/2020  Expected End Date: 09/03/2021  This Visit's Progress: On track  Priority: High  Note:    Interventions: . 1:1 collaboration with Lillard Anes, MD regarding  development and update of comprehensive plan of care as evidenced by provider attestation and co-signature . Inter-disciplinary care team collaboration (see longitudinal plan of care) . Comprehensive medication review performed; medication list updated in electronic medical record  Hypertension (BP goal <140/90) -Controlled -Current treatment: . lisinopril-hydrochlorothiazide 20-12.5 mg 2 tablets daily  . Clonidine 0.1 mg bid -Medications previously tried:  amlodipine, hydrochlorothiazide, furosemide,  -Current home readings:  has  been good at home. Typically checks weekly.  -Current dietary habits: trying to eat heart healthy and lower sodium.  -Current exercise habits:  has done home PT before. She knows how to do chair exercises and does them at home 1-2 times daily.  -Denies hypotensive/hypertensive symptoms -Educated on BP goals and benefits of medications for prevention of heart attack, stroke and kidney damage; Daily salt intake goal < 2300 mg; Exercise goal of 150 minutes per week; Importance of home blood pressure monitoring; -Counseled to monitor BP at home weekly, document, and provide log at future appointments -Counseled on diet and exercise extensively Recommended to continue current medication Recommended checking blood pressure weekly and recording.   Hyperlipidemia: (LDL goal < 100) -Controlled -Current treatment: . Rosuvastatin 10 mg daily  -Medications previously tried: lovastatin, atorvastatin -Current dietary patterns:  trying to abide by heart healthy and lower sodium. Doesn't eat breakfast. [place] with fruit for lunch. Drinking Ensure. Eats oatmeal. Frozen chicken nuggets and fries.  -Current exercise habits: chair exercises 1-2 times daily  -Educated on Cholesterol goals;  Benefits of statin for ASCVD risk reduction; Importance of limiting foods high in cholesterol; Exercise goal of 150 minutes per week; -Counseled on diet and exercise  extensively Recommended to continue current medication  COPD (Goal: control symptoms and prevent exacerbations) -Not ideally controlled -Current treatment  . albuterol nebulizer solution 3 mls every 6 hours prn wheezing or shortness of breath . Albuterol inhaler 2 puffs into lungs every 6 hours prn wheezing/shortness of breath . Breztri 160/9/4.8 mcg/act 2 puffs into the lungs am and bedtime  -Medications previously tried: none reported  -Pulmonary function testing: ordered with pulmonology for 10/2020 -Exacerbations requiring treatment in last 6 months: yes -Patient reports consistent use of maintenance inhaler -Frequency of rescue inhaler use: daily with exertion -Counseled on Proper inhaler technique; Benefits of consistent maintenance inhaler use smoking cessation.  -Counseled on diet and exercise extensively Recommended to continue current medication Counseled on smoking cessation. Smoking 1.5 packs per day.   Depression/Anxiety (Goal: manage symptoms of depression and anxiety) -Controlled -Current treatment: . bupropion xl 150 mg daily  . Diazepam 5 mg every 12 hours prn anxiety . Trazodone 100 mg daily at bedtime  -Medications previously tried/failed: Savella, sertraline, trazodone -PHQ9: 13 -GAD7: 10 -Educated on Benefits of medication for symptom control -Counseled on diet and exercise extensively Recommended to continue current medication   GERD (Goal: manage symptoms of GERD) -Not ideally controlled -Current treatment  . Omeprazole 40 mg daily . Promethazine 25 mg every 6 hours prn nausea/vomiting -Medications previously tried:  Linzess, metoclopramide,  -Counseled on diet and exercise extensively Recommended to continue current medication Recommended continuing to follow up with GI specialist and schedule test.   Tobacco use (Goal reduce cigarette use) -Uncontrolled -Previous quit attempts: none reported -Current treatment  . Patient take bupropion for  depression and is not ready to quit at this time  -Patient smokes Within 30 minutes of waking -Patient triggers include: stress, anxiety and sadness and craving the taste of a cigarette and needing to do something with your hands/mouth -On a scale of 1-10, reports MOTIVATION to quit is 0 -On a scale of 1-10, reports CONFIDENCE in quitting is 0 -Provided contact information for [place] Quit Line (1-800-QUIT-NOW) and encouraged patient to reach out to this group for support. -Educated on options when patient is ready. Patient hopes to maybe begin with reducing from 1.5 packs per day. She is not ready to begin cessation at this time  and will contact office when she is ready.   Osteoporosis / Osteopenia (Goal minimize risk of breaks or fractures) -Last DEXA Scan:  has had one a long time ago. Needs updated scan completed within [place] to allow for transportation. .    -Current treatment  . Diet  -Medications previously tried:  vitamin d -Recommend [phone] units of vitamin D daily. Recommend 1200 mg of calcium daily from dietary and supplemental sources. Recommend weight-bearing and muscle strengthening exercises for building and maintaining bone density. -Counseled on diet and exercise extensively Recommended updated Dexa scan and Vitamin D level drawn with next labs.   Pain sees Dr. Nelva Bush (Goal: manage back pain) -Controlled -Current treatment  . oxycodone-acetaminophen 10/325 mg every 6 hours prn for pain  -Medications previously tried:  celecoxib, ibuprofen, diclofenac gel  -Counseled on diet and exercise extensively Recommended continuing to follow-up with Dr. Nelva Bush.   Health Maintenance -Vaccine gaps: COVID vaccine (patient declines) Tetanus recommended if patient hasn't had in last 10 years. Patient reports having Pnaumonia shots.  -Current therapy:  . nitroglycerin 0.4 mg sl under the tongue every 5 minuts prn chest pain  -Patient is satisfied with current therapy and denies  issues -Counseled on diet and exercise extensively Recommended to continue current medication   Patient Goals/Self-Care Activities . Patient will:  - take medications as prescribed focus on medication adherence by using packaing and delivery through [facility].  check blood pressure weekly, document, and provide at future appointments target a minimum of 150 minutes of moderate intensity exercise weekly engage in dietary modifications by heart-healthy, low-sodium diet.   Follow Up Plan: Telephone follow up appointment with care management team member scheduled for:02/2021      The patient verbalized understanding of instructions, educational materials, and care plan provided today and declined offer to receive copy of patient instructions, educational materials, and care plan.  Telephone follow up appointment with pharmacy team member scheduled for:  Burnice Logan, [facility]  Diabetes Mellitus and Nutrition, Adult When you have diabetes, or diabetes mellitus, it is very important to have healthy eating habits because your blood sugar (glucose) levels are greatly affected by what you eat and drink. Eating healthy foods in the right amounts, at about the same times every day, can help you:  Control your blood glucose.  Lower your risk of heart disease.  Improve your blood pressure.  Reach or maintain a healthy weight. What can affect my meal plan? Every person with diabetes is different, and each person has different needs for a meal plan. Your health care provider may recommend that you work with a dietitian to make a meal plan that is best for you. Your meal plan may vary depending on factors such as:  The calories you need.  The medicines you take.  Your weight.  Your blood glucose, blood pressure, and cholesterol levels.  Your activity level.  Other health conditions you have, such as heart or kidney disease. How do carbohydrates affect me? Carbohydrates, also called carbs,  affect your blood glucose level more than any other type of food. Eating carbs naturally raises the amount of glucose in your blood. Carb counting is a method for keeping track of how many carbs you eat. Counting carbs is important to keep your blood glucose at a healthy level, especially if you use insulin or take certain oral diabetes medicines. It is important to know how many carbs you can safely have in each meal. This is different for every person. Your  dietitian can help you calculate how many carbs you should have at each meal and for each snack. How does alcohol affect me? Alcohol can cause a sudden decrease in blood glucose (hypoglycemia), especially if you use insulin or take certain oral diabetes medicines. Hypoglycemia can be a life-threatening condition. Symptoms of hypoglycemia, such as sleepiness, dizziness, and confusion, are similar to symptoms of having too much alcohol.  Do not drink alcohol if: ? Your health care provider tells you not to drink. ? You are pregnant, may be pregnant, or are planning to become pregnant.  If you drink alcohol: ? Do not drink on an empty stomach. ? Limit how much you use to:  0-1 drink a day for women.  0-2 drinks a day for men. ? Be aware of how much alcohol is in your drink. In the [place] one drink equals one 12 oz bottle of beer (355 mL), one 5 oz glass of wine (148 mL), or one 1 oz glass of hard liquor (44 mL). ? Keep yourself hydrated with water, diet soda, or unsweetened iced tea.  Keep in mind that regular soda, juice, and other mixers may contain a lot of sugar and must be counted as carbs. What are tips for following this plan? Reading food labels  Start by checking the serving size on the "Nutrition Facts" label of packaged foods and drinks. The amount of calories, carbs, fats, and other nutrients listed on the label is based on one serving of the item. Many items contain more than one serving per package.  Check the total grams (g)  of carbs in one serving. You can calculate the number of servings of carbs in one serving by dividing the total carbs by 15. For example, if a food has 30 g of total carbs per serving, it would be equal to 2 servings of carbs.  Check the number of grams (g) of saturated fats and trans fats in one serving. Choose foods that have a low amount or none of these fats.  Check the number of milligrams (mg) of salt (sodium) in one serving. Most people should limit total sodium intake to less than 2,300 mg per day.  Always check the nutrition information of foods labeled as "low-fat" or "nonfat." These foods may be higher in added sugar or refined carbs and should be avoided.  Talk to your dietitian to identify your daily goals for nutrients listed on the label. Shopping  Avoid buying canned, pre-made, or processed foods. These foods tend to be high in fat, sodium, and added sugar.  Shop around the outside edge of the grocery store. This is where you will most often find fresh fruits and vegetables, bulk grains, fresh meats, and fresh dairy. Cooking  Use low-heat cooking methods, such as baking, instead of high-heat cooking methods like deep frying.  Cook using healthy oils, such as olive, canola, or sunflower oil.  Avoid cooking with butter, cream, or high-fat meats. Meal planning  Eat meals and snacks regularly, preferably at the same times every day. Avoid going long periods of time without eating.  Eat foods that are high in fiber, such as fresh fruits, vegetables, beans, and whole grains. Talk with your dietitian about how many servings of carbs you can eat at each meal.  Eat 4-6 oz (112-168 g) of lean protein each day, such as lean meat, chicken, fish, eggs, or tofu. One ounce (oz) of lean protein is equal to: ? 1 oz (28 g) of meat, chicken, or  fish. ? 1 egg. ?  cup (62 g) of tofu.  Eat some foods each day that contain healthy fats, such as avocado, nuts, seeds, and fish.   What foods  should I eat? Fruits Berries. Apples. Oranges. Peaches. Apricots. Plums. Grapes. Mango. Papaya. Pomegranate. Kiwi. Cherries. Vegetables Lettuce. Spinach. Leafy greens, including kale, chard, collard greens, and mustard greens. Beets. Cauliflower. Cabbage. Broccoli. Carrots. Green beans. Tomatoes. Peppers. Onions. Cucumbers. Brussels sprouts. Grains Whole grains, such as whole-wheat or whole-grain bread, crackers, tortillas, cereal, and pasta. Unsweetened oatmeal. Quinoa. [place] Thursby or wild rice. Meats and other proteins Seafood. Poultry without skin. Lean cuts of poultry and beef. Tofu. Nuts. Seeds. Dairy Low-fat or fat-free dairy products such as milk, yogurt, and cheese. The items listed above may not be a complete list of foods and beverages you can eat. Contact a dietitian for more information. What foods should I avoid? Fruits Fruits canned with syrup. Vegetables Canned vegetables. Frozen vegetables with butter or cream sauce. Grains Refined white flour and flour products such as bread, pasta, snack foods, and cereals. Avoid all processed foods. Meats and other proteins Fatty cuts of meat. Poultry with skin. Breaded or fried meats. Processed meat. Avoid saturated fats. Dairy Full-fat yogurt, cheese, or milk. Beverages Sweetened drinks, such as soda or iced tea. The items listed above may not be a complete list of foods and beverages you should avoid. Contact a dietitian for more information. Questions to ask a health care provider  Do I need to meet with a diabetes educator?  Do I need to meet with a dietitian?  What number can I call if I have questions?  When are the best times to check my blood glucose? Where to find more information:  American Diabetes Association: diabetes.org  Academy of Nutrition and Dietetics: www.eatright.[employer] of Diabetes and Digestive and Kidney Diseases: [employer].dk  Association of Diabetes Care and Education Specialists:  www.diabeteseducator.org Summary  It is important to have healthy eating habits because your blood sugar (glucose) levels are greatly affected by what you eat and drink.  A healthy meal plan will help you control your blood glucose and maintain a healthy lifestyle.  Your health care provider may recommend that you work with a dietitian to make a meal plan that is best for you.  Keep in mind that carbohydrates (carbs) and alcohol have immediate effects on your blood glucose levels. It is important to count carbs and to use alcohol carefully. This information is not intended to replace advice given to you by your health care provider. Make sure you discuss any questions you have with your health care provider. Document Revised: 04/22/2019 Document Reviewed: 04/22/2019 Elsevier Patient Education  2021 [employer].

## 2020-09-07 ENCOUNTER — Ambulatory Visit (AMBULATORY_SURGERY_CENTER): Payer: Medicare Other | Admitting: *Deleted

## 2020-09-07 ENCOUNTER — Other Ambulatory Visit: Payer: Self-pay

## 2020-09-07 ENCOUNTER — Telehealth: Payer: Self-pay

## 2020-09-07 VITALS — Ht 62.0 in | Wt 165.0 lb

## 2020-09-07 DIAGNOSIS — R1319 Other dysphagia: Secondary | ICD-10-CM

## 2020-09-07 NOTE — Progress Notes (Signed)
    Chronic Care Management Pharmacy Assistant   Name: [name] [name]  MRN: [id number] DOB: 07/29/1945    Reason for Encounter: Medication Review for refill status of Vitamin D    Medications: Outpatient Encounter Medications as of 09/07/2020  Medication Sig Note  . albuterol (PROVENTIL) (2.5 MG/3ML) 0.083% nebulizer solution Take 3 mLs (2.5 mg total) by nebulization every 6 (six) hours as needed for wheezing or shortness of breath.   Marland Kitchen albuterol (VENTOLIN HFA) 108 (90 Base) MCG/ACT inhaler INHALE 2 PUFFS INTO THE LUNGS EVERY 6 HOURS AS NEEDED FOR WHEEZING OR SHORTNESS OF BREATH (Patient taking differently: Inhale 2 puffs into the lungs every 6 (six) hours as needed for wheezing or shortness of breath.)   . aspirin EC 81 MG tablet Take 1 tablet (81 mg total) by mouth daily. Swallow whole.   . Budeson-Glycopyrrol-Formoterol 160-9-4.8 MCG/ACT AERO Inhale 2 puffs into the lungs in the morning and at bedtime. 07/22/2020: BREZTRI  . buPROPion (WELLBUTRIN XL) 150 MG 24 hr tablet TAKE 1 TABLET(150 MG) BY MOUTH DAILY (Patient taking differently: Take 150 mg by mouth in the morning.)   . cloNIDine (CATAPRES) 0.1 MG tablet Take 1 tablet (0.1 mg total) by mouth 2 (two) times daily.   . diazepam (VALIUM) 5 MG tablet Take 1 tablet (5 mg total) by mouth every 12 (twelve) hours as needed for anxiety. Watch for sedation   . diclofenac Sodium (VOLTAREN) 1 % GEL Apply 2 g topically 4 (four) times daily. (Patient not taking: No sig reported)   . lisinopril-hydrochlorothiazide (ZESTORETIC) 20-12.5 MG tablet Take 2 tablets by mouth in the morning.   . loperamide (IMODIUM) 2 MG capsule Take by mouth. (Patient not taking: No sig reported)   . nitroGLYCERIN (NITROSTAT) 0.4 MG SL tablet Place 1 tablet (0.4 mg total) under the tongue every 5 (five) minutes as needed for chest pain.   Marland Kitchen omeprazole (PRILOSEC) 40 MG capsule Take 1 capsule (40 mg total) by mouth in the morning and at bedtime. (Patient taking differently: Take  40 mg by mouth in the morning.)   . oxyCODONE-acetaminophen (PERCOCET) 10-325 MG tablet Take 1 tablet by mouth every 6 (six) hours as needed for pain.   . promethazine (PHENERGAN) 25 MG tablet Take 1 tablet (25 mg total) by mouth every 6 (six) hours as needed for nausea or vomiting.   . rosuvastatin (CRESTOR) 10 MG tablet Take 1 tablet (10 mg total) by mouth daily. (Patient taking differently: Take 10 mg by mouth in the morning.)   . traZODone (DESYREL) 100 MG tablet Take 1 tablet (100 mg total) by mouth at bedtime.   . Vitamin D, Ergocalciferol, (DRISDOL) 1.25 MG (50000 UNIT) CAPS capsule Take 1 capsule (50,000 Units total) by mouth every 7 (seven) days. (Patient not taking: No sig reported)    No facility-administered encounter medications on file as of 09/07/2020.    Called [facility] to check the last fill date for the patients Vitamin D.  The last fill history was not available in the patients chart.  Per [facility] the last fill date was 12/11/19.  I have notified Donette Larry, [place], [place] Pharmacist Assistant [phone]

## 2020-09-07 NOTE — Progress Notes (Addendum)
No egg or soy allergy known to patient  No issues with past sedation with any surgeries or procedures Patient denies ever being told they had issues or difficulty with intubation  No FH of Malignant Hyperthermia No diet pills per patient No home 02 use per patient  No blood thinners per patient  Pt denies issues with constipation  No A fib or A flutter  EMMI video to pt or via [place] 19 guidelines implemented in [place] today with Pt and RN  Pt is fully vaccinated  for Covid     Due to the COVID-19 pandemic we are asking patients to follow certain guidelines.  Pt aware of COVID protocols and [facility] guidelines   Virtual pre visit completed. Instructions mailed.

## 2020-09-10 ENCOUNTER — Other Ambulatory Visit: Payer: Self-pay

## 2020-09-10 MED ORDER — VITAMIN D (ERGOCALCIFEROL) 1.25 MG (50000 UNIT) PO CAPS: 50000.0000 [IU] | ORAL_CAPSULE | ORAL | 0 refills | Status: DC

## 2020-09-13 ENCOUNTER — Telehealth: Payer: Self-pay

## 2020-09-13 NOTE — Progress Notes (Signed)
    Chronic Care Management Pharmacy Assistant   Name: [name] [name]  MRN: [id number] DOB: [birth year]  Reason for Encounter: Medication Review for Clonidine, Omeprazole and Trazadone    Medications: Outpatient Encounter Medications as of 09/13/2020  Medication Sig Note  . albuterol (PROVENTIL) (2.5 MG/3ML) 0.083% nebulizer solution Take 3 mLs (2.5 mg total) by nebulization every 6 (six) hours as needed for wheezing or shortness of breath.   Marland Kitchen albuterol (VENTOLIN HFA) 108 (90 Base) MCG/ACT inhaler INHALE 2 PUFFS INTO THE LUNGS EVERY 6 HOURS AS NEEDED FOR WHEEZING OR SHORTNESS OF BREATH (Patient taking differently: Inhale 2 puffs into the lungs every 6 (six) hours as needed for wheezing or shortness of breath.)   . aspirin EC 81 MG tablet Take 1 tablet (81 mg total) by mouth daily. Swallow whole.   . Budeson-Glycopyrrol-Formoterol 160-9-4.8 MCG/ACT AERO Inhale 2 puffs into the lungs in the morning and at bedtime. 07/22/2020: BREZTRI  . buPROPion (WELLBUTRIN XL) 150 MG 24 hr tablet TAKE 1 TABLET(150 MG) BY MOUTH DAILY (Patient taking differently: Take 150 mg by mouth in the morning.)   . cloNIDine (CATAPRES) 0.1 MG tablet Take 1 tablet (0.1 mg total) by mouth 2 (two) times daily.   . diazepam (VALIUM) 5 MG tablet Take 1 tablet (5 mg total) by mouth every 12 (twelve) hours as needed for anxiety. Watch for sedation   . diclofenac Sodium (VOLTAREN) 1 % GEL Apply 2 g topically 4 (four) times daily. (Patient not taking: No sig reported)   . lisinopril-hydrochlorothiazide (ZESTORETIC) 20-12.5 MG tablet Take 2 tablets by mouth in the morning.   . loperamide (IMODIUM) 2 MG capsule Take by mouth. (Patient not taking: No sig reported)   . nitroGLYCERIN (NITROSTAT) 0.4 MG SL tablet Place 1 tablet (0.4 mg total) under the tongue every 5 (five) minutes as needed for chest pain.   Marland Kitchen omeprazole (PRILOSEC) 40 MG capsule Take 1 capsule (40 mg total) by mouth in the morning and at bedtime. (Patient taking differently:  Take 40 mg by mouth in the morning.)   . oxyCODONE-acetaminophen (PERCOCET) 10-325 MG tablet Take 1 tablet by mouth every 6 (six) hours as needed for pain.   . promethazine (PHENERGAN) 25 MG tablet Take 1 tablet (25 mg total) by mouth every 6 (six) hours as needed for nausea or vomiting.   . rosuvastatin (CRESTOR) 10 MG tablet Take 1 tablet (10 mg total) by mouth daily. (Patient taking differently: Take 10 mg by mouth in the morning.)   . traZODone (DESYREL) 100 MG tablet Take 1 tablet (100 mg total) by mouth at bedtime.   . Vitamin D, Ergocalciferol, (DRISDOL) 1.25 MG (50000 UNIT) CAPS capsule Take 1 capsule (50,000 Units total) by mouth every 7 (seven) days.    No facility-administered encounter medications on file as of 09/13/2020.    [facility] contacted [place] to clarify these medications due to the script they have is different than what is in the chart/how patient is taking them.   I called patient to clarify as to how she is taking them.   Patient is only taking 1 Trazadone at night, 1 Omeprazole and 1 Clonidine in the morning.  I have updated [facility] that the onboarding form was correct.    Clarita Leber, [place] Pharmacist Assistant [phone]

## 2020-09-22 ENCOUNTER — Encounter: Payer: Self-pay | Admitting: Gastroenterology

## 2020-09-22 ENCOUNTER — Other Ambulatory Visit: Payer: Self-pay | Admitting: Gastroenterology

## 2020-09-22 ENCOUNTER — Other Ambulatory Visit: Payer: Self-pay

## 2020-09-22 ENCOUNTER — Ambulatory Visit (AMBULATORY_SURGERY_CENTER): Payer: Medicare Other | Admitting: Gastroenterology

## 2020-09-22 VITALS — BP 125/66 | HR 76 | Temp 97.9°F | Resp 14 | Ht 62.0 in | Wt 165.0 lb

## 2020-09-22 DIAGNOSIS — R1319 Other dysphagia: Secondary | ICD-10-CM

## 2020-09-22 DIAGNOSIS — K2970 Gastritis, unspecified, without bleeding: Secondary | ICD-10-CM

## 2020-09-22 DIAGNOSIS — K449 Diaphragmatic hernia without obstruction or gangrene: Secondary | ICD-10-CM | POA: Diagnosis not present

## 2020-09-22 DIAGNOSIS — K222 Esophageal obstruction: Secondary | ICD-10-CM | POA: Diagnosis not present

## 2020-09-22 DIAGNOSIS — K219 Gastro-esophageal reflux disease without esophagitis: Secondary | ICD-10-CM | POA: Diagnosis not present

## 2020-09-22 DIAGNOSIS — K2950 Unspecified chronic gastritis without bleeding: Secondary | ICD-10-CM

## 2020-09-22 MED ORDER — SODIUM CHLORIDE 0.9 % IV SOLN: 500.0000 mL | Freq: Once | INTRAVENOUS | Status: DC

## 2020-09-22 NOTE — Progress Notes (Signed)
A and O x3. Report to RN. Tolerated MAC anesthesia well.  gums unchanged after procedure. 

## 2020-09-22 NOTE — Progress Notes (Signed)
VS-CW  Pt's states no medical or surgical changes since previsit or office visit.  

## 2020-09-22 NOTE — Patient Instructions (Signed)
YOU HAD AN ENDOSCOPIC PROCEDURE TODAY AT THE [facility]:   Refer to the procedure report that was given to you for any specific questions about what was found during the examination.  If the procedure report does not answer your questions, please call your gastroenterologist to clarify.  If you requested that your care partner not be given the details of your procedure findings, then the procedure report has been included in a sealed envelope for you to review at your convenience later.  YOU SHOULD EXPECT: Some feelings of bloating in the abdomen. Passage of more gas than usual.  Walking can help get rid of the air that was put into your GI tract during the procedure and reduce the bloating. If you had a lower endoscopy (such as a colonoscopy or flexible sigmoidoscopy) you may notice spotting of blood in your stool or on the toilet paper. If you underwent a bowel prep for your procedure, you may not have a normal bowel movement for a few days.  Please Note:  You might notice some irritation and congestion in your nose or some drainage.  This is from the oxygen used during your procedure.  There is no need for concern and it should clear up in a day or so.  SYMPTOMS TO REPORT IMMEDIATELY:   Following lower endoscopy (colonoscopy or flexible sigmoidoscopy):  Excessive amounts of blood in the stool  Significant tenderness or worsening of abdominal pains  Swelling of the abdomen that is new, acute  Fever of 100F or higher   Following upper endoscopy (EGD)  Vomiting of blood or coffee ground material  New chest pain or pain under the shoulder blades  Painful or persistently difficult swallowing  New shortness of breath  Fever of 100F or higher  Black, tarry-looking stools  For urgent or emergent issues, a gastroenterologist can be reached at any hour by calling [phone]. Do not use MyChart messaging for urgent concerns.    DIET:  We do recommend a small meal at first, but  then you may proceed to your regular diet.  Drink plenty of fluids but you should avoid alcoholic beverages for 24 hours.  ACTIVITY:  You should plan to take it easy for the rest of today and you should NOT DRIVE or use heavy machinery until tomorrow (because of the sedation medicines used during the test).    FOLLOW UP: Our staff will call the number listed on your records 48-72 hours following your procedure to check on you and address any questions or concerns that you may have regarding the information given to you following your procedure. If we do not reach you, we will leave a message.  We will attempt to reach you two times.  During this call, we will ask if you have developed any symptoms of COVID 19. If you develop any symptoms (ie: fever, flu-like symptoms, shortness of breath, cough etc.) before then, please call [phone].  If you test positive for Covid 19 in the 2 weeks post procedure, please call and report this information to us.    If any biopsies were taken you will be contacted by phone or by letter within the next 1-3 weeks.  Please call us at [phone] if you have not heard about the biopsies in 3 weeks.    SIGNATURES/CONFIDENTIALITY: You and/or your care partner have signed paperwork which will be entered into your electronic medical record.  These signatures attest to the fact that that the information above on   your After Visit Summary has been reviewed and is understood.  Full responsibility of the confidentiality of this discharge information lies with you and/or your care-partner. 

## 2020-09-22 NOTE — Op Note (Signed)
[facility] Patient Name: [name] [name] Procedure Date: 09/22/2020 10:54 AM MRN: [id number] Endoscopist: Jackquline Denmark , MD Age: 75 Referring MD:  Date of Birth: 01-17-1946 Gender: Female Account #: [id number] Procedure:                Upper GI endoscopy Indications:              Dysphagia Medicines:                Monitored Anesthesia Care Procedure:                Pre-Anesthesia Assessment:                           - Prior to the procedure, a History and Physical                            was performed, and patient medications and                            allergies were reviewed. The patient's tolerance of                            previous anesthesia was also reviewed. The risks                            and benefits of the procedure and the sedation                            options and risks were discussed with the patient.                            All questions were answered, and informed consent                            was obtained. Prior Anticoagulants: The patient has                            taken no previous anticoagulant or antiplatelet                            agents. ASA Grade Assessment: II - A patient with                            mild systemic disease. After reviewing the risks                            and benefits, the patient was deemed in                            satisfactory condition to undergo the procedure.                           After obtaining informed consent, the endoscope was  passed under direct vision. Throughout the                            procedure, the patient's blood pressure, pulse, and                            oxygen saturations were monitored continuously. The                            Endoscope was introduced through the mouth, and                            advanced to the second part of duodenum. The upper                            GI endoscopy was accomplished without difficulty.                             The patient tolerated the procedure well. Scope In: Scope Out: Findings:                 The examined esophagus was moderately tortuous but                            normal.                           A non-obstructing and moderate muscular Schatzki                            ring was found at the gastroesophageal junction, 32                            cm from incisors. The scope was withdrawn. Dilation                            was performed with a Maloney dilator with mild                            resistance at 50 Fr and 52 Fr. esophageal biopsies                            were not performed since previous biopsies were                            negative for eosinophilic esophagitis.                           A 2 cm hiatal hernia was present.                           Diffuse mild inflammation characterized by erythema                            was found in the  entire examined stomach. Biopsies                            were taken with a cold forceps for histology.                           The examined duodenum was normal. Complications:            No immediate complications. Estimated Blood Loss:     Estimated blood loss: none. Impression:               - Presbyesophagus                           - Schatzki ring s/p esophageal dilatation.                           - 2 cm hiatal hernia.                           - Mild atrophic gastritis. Recommendation:           - Patient has a contact number available for                            emergencies. The signs and symptoms of potential                            delayed complications were discussed with the                            patient. Return to normal activities tomorrow.                            Written discharge instructions were provided to the                            patient.                           - post dilatation diet.                           - Continue present medications.                            - Await pathology results.                           - The findings and recommendations were discussed                            with the patient's family.                           - If still with problems, would obtain barium  swallow possibly followed by esophageal manometry                            (r/o motility disorder/achalasia), if needed                           - FU PRN. Jackquline Denmark, MD 09/22/2020 11:22:04 AM This report has been signed electronically.

## 2020-09-22 NOTE — Progress Notes (Signed)
Called to room to assist during endoscopic procedure.  Patient ID and intended procedure confirmed with present staff. Received instructions for my participation in the procedure from the performing physician.  

## 2020-09-24 ENCOUNTER — Telehealth: Payer: Self-pay

## 2020-09-24 ENCOUNTER — Encounter: Payer: Medicare Other | Admitting: Gastroenterology

## 2020-09-24 NOTE — Telephone Encounter (Signed)
I have tried calling patient and her granddaughter several times No answer  [name] [name], Can we call this patient Monday to make sure she is doing okay RG

## 2020-09-24 NOTE — Telephone Encounter (Signed)
  Follow up Call-  Call back number 09/22/2020  Post procedure Call Back phone  # [phone]  Permission to leave phone message Yes  Some recent data might be hidden     Patient questions:  Do you have a fever, pain , or abdominal swelling? Yes.   Pain Score  8 *  Have you tolerated food without any problems? Yes.    Have you been able to return to your normal activities? No.  Do you have any questions about your discharge instructions: Diet   No. Medications  No. Follow up visit  No.  Do you have questions or concerns about your Care? Yes.    Actions: * If pain score is 4 or above: Physician/ provider Notified : Jackquline Denmark, MD  Dr. Lyndel Safe, I spoke with [name] [name] this morning at 7:30 and she has been having abdominal pain since the evening of her procedure (April 27). She rates this pain an "8" and states that she's had some difficulty breathing last night.  No vomiting, bleeding, swelling or tenderness noted.  Patient has been able to eat and drink but has not had a bowel movement yet.  Patient says she "basically laid around all day yesterday". Please advise.  Thanks.

## 2020-09-28 ENCOUNTER — Other Ambulatory Visit: Payer: Self-pay

## 2020-09-28 MED ORDER — LISINOPRIL-HYDROCHLOROTHIAZIDE 20-12.5 MG PO TABS: 2.0000 | ORAL_TABLET | Freq: Every morning | ORAL | 0 refills | Status: DC

## 2020-09-29 NOTE — Telephone Encounter (Signed)
Patient is complaining of abdominal pain and when she starts eating her stomach starts to hurt worse. No nausea or vomiting.  She said that the pain is in the middle of her stomach and it hurts 24/7. Said she has only taken her pain medications thinking that would help.

## 2020-09-29 NOTE — Telephone Encounter (Signed)
May 11th at 940. She has to give transportation 3 day notice for rides and said she doesn't need anyone with her for her appointment

## 2020-09-29 NOTE — Telephone Encounter (Signed)
Looks like that she was having these problems before.  Still, can we work her into our clinic or APP clinic. Note that she had negative CT Abdo/pelvis February 2022  RG

## 2020-10-02 ENCOUNTER — Other Ambulatory Visit: Payer: Self-pay | Admitting: Legal Medicine

## 2020-10-03 ENCOUNTER — Encounter: Payer: Self-pay | Admitting: Gastroenterology

## 2020-10-06 ENCOUNTER — Ambulatory Visit: Payer: Medicare Other | Admitting: Gastroenterology

## 2020-10-12 ENCOUNTER — Ambulatory Visit (INDEPENDENT_AMBULATORY_CARE_PROVIDER_SITE_OTHER): Payer: Medicare Other | Admitting: Legal Medicine

## 2020-10-12 ENCOUNTER — Encounter: Payer: Self-pay | Admitting: Legal Medicine

## 2020-10-12 ENCOUNTER — Other Ambulatory Visit: Payer: Self-pay

## 2020-10-12 VITALS — BP 124/80 | HR 82 | Temp 97.6°F | Resp 16 | Ht 62.0 in | Wt 165.0 lb

## 2020-10-12 DIAGNOSIS — Z23 Encounter for immunization: Secondary | ICD-10-CM | POA: Diagnosis not present

## 2020-10-12 DIAGNOSIS — N951 Menopausal and female climacteric states: Secondary | ICD-10-CM | POA: Diagnosis not present

## 2020-10-12 NOTE — Progress Notes (Signed)
Subjective:  Patient ID: [name] [name], female    DOB: 10-10-1945  Age: 75 y.o. MRN: [id number]  Chief Complaint  Patient presents with  . AWV    HPI Encounter for general adult medical examination without abnormal findings  Physical ("At Risk" items are starred): Patient's last physical exam was 1 year ago .  Smoking: Life-long non-smoker ;  Physical Activity: Exercises at least 3 times per week ;  Alcohol/Drug Use: Is a non-drinker ; No illicit drug use ;  Patient is not afflicted from Stress Incontinence and Urge Incontinence  Safety: reviewed ; Patient wears a seat belt, has smoke detectors, has carbon monoxide detectors, practices appropriate gun safety, and wears sunscreen with extended sun exposure. Dental Care: biannual cleanings, brushes and flosses daily. Ophthalmology/Optometry: Annual visit.  Hearing loss: none Vision impairments: none  dexa: last ?  Scheduling mammogram  Fall Risk  10/12/2020 03/30/2020 08/11/2019 12/15/2016  Falls in the past year? 1 1 - Yes  Comment - - - Emmi Telephone Survey: data to providers prior to load  Number falls in past yr: 0 0 0 2 or more  Comment - - - Emmi Telephone Survey Actual Response = 2  Injury with Fall? 0 1 1 No  Risk for fall due to : Impaired balance/gait;Impaired mobility;History of fall(s) - Impaired balance/gait -     Depression screen [facility] 2/9 10/12/2020 09/03/2020 07/14/2020 04/15/2020 03/30/2020  Decreased Interest 2 1 1 1 2   Down, Depressed, Hopeless 2 2 2 2 1   PHQ - 2 Score 4 3 3 3 3   Altered sleeping 3 2 2 3  -  Tired, decreased energy 3 2 2 3 1   Change in appetite 0 2 2 2 1   Feeling bad or failure about yourself  0 2 2 3  0  Trouble concentrating 3 1 1 3 1   Moving slowly or fidgety/restless 2 1 1  - -  Suicidal thoughts 0 0 0 0 0  PHQ-9 Score 15 13 13 17  -  Difficult doing work/chores Not difficult at all Very difficult Very difficult Extremely dIfficult Somewhat difficult       Functional Status Survey: Is the  patient deaf or have difficulty hearing?: No Does the patient have difficulty seeing, even when wearing glasses/contacts?: Yes Does the patient have difficulty concentrating, remembering, or making decisions?: Yes Does the patient have difficulty walking or climbing stairs?: Yes Does the patient have difficulty dressing or bathing?: No Does the patient have difficulty doing errands alone such as visiting a doctor's office or shopping?: Yes   Social Hx   Social History   Socioeconomic History  . Marital status: Widowed    Spouse name: Not on file  . Number of children: 4  . Years of education: Not on file  . Highest education level: Not on file  Occupational History  . Occupation: Retired  Tobacco Use  . Smoking status: Current Every Day Smoker    Packs/day: 1.50    Years: 47.00    Pack years: 70.50    Types: Cigarettes  . Smokeless tobacco: Never Used  . Tobacco comment: down to 1 pack a day  Vaping Use  . Vaping Use: Never used  Substance and Sexual Activity  . Alcohol use: No  . Drug use: No  . Sexual activity: Not Currently  Other Topics Concern  . Not on file  Social History Narrative  . Not on file   Social Determinants of Health   Financial Resource Strain: Low Risk   .  Difficulty of Paying Living Expenses: Not hard at all  Food Insecurity: No Food Insecurity  . Worried About Charity fundraiser in the Last Year: Never true  . Ran Out of Food in the Last Year: Never true  Transportation Needs: Unknown  . Lack of Transportation (Medical): No  . Lack of Transportation (Non-Medical): Not on file  Physical Activity: Insufficiently Active  . Days of Exercise per Week: 2 days  . Minutes of Exercise per Session: 20 min  Stress: Stress Concern Present  . Feeling of Stress : To some extent  Social Connections: Socially Isolated  . Frequency of Communication with Friends and Family: Three times a week  . Frequency of Social Gatherings with Friends and Family: Never   . Attends Religious Services: Never  . Active Member of Clubs or Organizations: No  . Attends Archivist Meetings: Never  . Marital Status: Widowed   Past Medical History:  Diagnosis Date  . Cataract   . Chronic left shoulder pain 09/27/2015  . COPD (chronic obstructive pulmonary disease) ([place])   . Enlarged aorta ([place])   . GERD (gastroesophageal reflux disease)   . Hyperlipidemia   . Hypertension   . Osteoarthritis   . Pain of right hip joint 11/22/2015  . Primary osteoarthritis of left knee 02/04/2015  . S/P total knee arthroplasty 02/15/2015  . SOB (shortness of breath)    Family History  Problem Relation Age of Onset  . Heart disease Mother   . Cancer Father        unknown  . Diabetes Father   . Heart disease Father   . Stroke Father   . Lupus Sister   . Seizures Son   . COPD Son   . Colon cancer Neg Hx   . Esophageal cancer Neg Hx   . Stomach cancer Neg Hx   . Rectal cancer Neg Hx     Review of Systems  Constitutional: Negative for activity change and appetite change.  HENT: Negative for congestion and sinus pain.   Eyes: Negative for visual disturbance.  Respiratory: Negative for chest tightness and shortness of breath.   Cardiovascular: Negative for chest pain, palpitations and leg swelling.  Gastrointestinal: Negative for abdominal distention and abdominal pain.  Endocrine: Negative for polyuria.  Genitourinary: Negative for difficulty urinating, dysuria and urgency.  Musculoskeletal: Negative for arthralgias and back pain.  Skin: Negative.   Neurological: Negative.   Psychiatric/Behavioral: Negative.      Objective:  BP 124/80   Pulse 82   Temp 97.6 F (36.4 C)   Resp 16   Ht 5\' 2"  (1.575 m)   Wt 165 lb (74.8 kg)   SpO2 98%   BMI 30.18 kg/m   BP/Weight 10/12/2020 09/22/2020 01/27/5175  Systolic BP 160 737 -  Diastolic BP 80 66 -  Wt. (Lbs) 165 165 165  BMI 30.18 30.18 30.18    Physical Examna Diabetic Foot Exam - Simple   Simple  Foot Form Diabetic Foot exam was performed with the following findings: Yes 10/12/2020  2:33 PM  Visual Inspection No deformities, no ulcerations, no other skin breakdown bilaterally: Yes Sensation Testing Intact to touch and monofilament testing bilaterally: Yes Pulse Check Posterior Tibialis and Dorsalis pulse intact bilaterally: Yes Comments      Lab Results  Component Value Date   WBC 6.2 07/14/2020   HGB 13.3 07/22/2020   HCT 39.0 07/22/2020   PLT 162 07/14/2020   GLUCOSE 82 08/20/2020   CHOL 167 07/14/2020  TRIG 51 07/14/2020   HDL 57 07/14/2020   LDLCALC 100 (H) 07/14/2020   ALT 13 07/22/2020   AST 16 07/22/2020   NA 138 08/20/2020   K 3.8 08/20/2020   CL 98 08/20/2020   CREATININE 0.73 08/20/2020   BUN 11 08/20/2020   CO2 26 08/20/2020   INR 1.02 01/28/2015   HGBA1C 5.1 07/14/2020      Assessment & Plan:  1. Menopausal and female climacteric states - MM Digital Screening - DG DXA BODY COMPOSITION Ordered mammogram and DXA scan    Menopausal and female climacteric states -     MM Digital Screening -     DG DXA BODY COMPOSITION     These are the goals we discussed: Goals    . Increase physical activity    . Learn and Do Breathing Exercises-COPD     Timeframe:  Long-Range Goal Priority:  High Start Date:                             Expected End Date:                       Follow Up Date 03/01/2021    - do breathing exercises every day    Why is this important?    Breathing exercises can help lessen the cough that comes with chronic obstructive pulmonary disease.   Doing the exercises will give you more energy.   They will also help you to control your symptoms.    Notes:     . Learn More About My Health     Timeframe:  Long-Range Goal Priority:  High Start Date:                             Expected End Date:                        Follow Up Date 03/01/2021    - tell my story and reason for my visit - make a list of questions -  bring a list of my medicines to the visit    Why is this important?    The best way to learn about your health and care is by talking to the doctor and nurse.   They will answer your questions and give you information in the way that you like best.    Notes:     Marland Kitchen Manage My Medicine     Timeframe:  Long-Range Goal Priority:  High Start Date:                             Expected End Date:                       Follow Up Date 03/01/2021    - call for medicine refill 2 or 3 days before it runs out - keep a list of all the medicines I take; vitamins and herbals too - learn to read medicine labels    Why is this important?   . These steps will help you keep on track with your medicines.   Notes:         This is a list of the screening recommended for you and due dates:  Health Maintenance  Topic Date Due  .  Eye exam for diabetics  Never done  . Hepatitis C Screening: USPSTF Recommendation to screen - Ages 36-79 yo.  Never done  . Tetanus Vaccine  Never done  . Colon Cancer Screening  Never done  . Mammogram  Never done  . DEXA scan (bone density measurement)  Never done  . Pneumonia vaccines (1 of 2 - PCV13) Never done  . COVID-19 Vaccine (1) 10/28/2021*  . Flu Shot  12/27/2020  . Hemoglobin A1C  01/11/2021  . Complete foot exam   10/12/2021  . HPV Vaccine  Aged Out  *Topic was postponed. The date shown is not the original due date.     AN INDIVIDUALIZED CARE PLAN: was established or reinforced today.   SELF MANAGEMENT: The patient and I together assessed ways to personally work towards obtaining the recommended goals  Support needs The patient and/or family needs were assessed and services were offered and not necessary at this time.    Follow-up: Return in about 3 months (around 01/12/2021) for fasting.  [name], MD [facility] [phone]

## 2020-10-19 ENCOUNTER — Telehealth: Payer: Self-pay | Admitting: Legal Medicine

## 2020-10-19 ENCOUNTER — Encounter: Payer: Self-pay | Admitting: Legal Medicine

## 2020-10-19 NOTE — Telephone Encounter (Signed)
   [name] [name] has been scheduled for the following appointment:  WHAT: Mammogram & Dexa WHERE: [facility] DATE: 11/03/20 TIME: 9:30 am  Unable to leave message for patient. Letter has been sent.

## 2020-10-19 NOTE — Telephone Encounter (Signed)
Pt notified of appointment information along with the following:  PLEASE DO NOT APPLY DEODORANT OR POWDER until after the test. Please do not take any calcium supplements or multivitamins for 24 hours prior to your test.

## 2020-11-12 ENCOUNTER — Other Ambulatory Visit: Payer: Self-pay | Admitting: Legal Medicine

## 2020-11-12 DIAGNOSIS — F4321 Adjustment disorder with depressed mood: Secondary | ICD-10-CM

## 2020-11-15 DIAGNOSIS — G894 Chronic pain syndrome: Secondary | ICD-10-CM | POA: Diagnosis not present

## 2020-11-15 DIAGNOSIS — M5126 Other intervertebral disc displacement, lumbar region: Secondary | ICD-10-CM | POA: Diagnosis not present

## 2020-11-16 DIAGNOSIS — H269 Unspecified cataract: Secondary | ICD-10-CM | POA: Insufficient documentation

## 2020-11-16 DIAGNOSIS — K219 Gastro-esophageal reflux disease without esophagitis: Secondary | ICD-10-CM | POA: Insufficient documentation

## 2020-11-17 ENCOUNTER — Telehealth: Payer: Self-pay

## 2020-11-17 DIAGNOSIS — M85852 Other specified disorders of bone density and structure, left thigh: Secondary | ICD-10-CM | POA: Diagnosis not present

## 2020-11-17 DIAGNOSIS — Z1231 Encounter for screening mammogram for malignant neoplasm of breast: Secondary | ICD-10-CM | POA: Diagnosis not present

## 2020-11-17 DIAGNOSIS — M85832 Other specified disorders of bone density and structure, left forearm: Secondary | ICD-10-CM | POA: Diagnosis not present

## 2020-11-17 NOTE — Progress Notes (Signed)
Chronic Care Management Pharmacy Assistant   Name: [name] [name]  MRN: [id number] DOB: 1946-02-27   Reason for Encounter: Disease State for general adherence   Recent office visits:  10/19/20-Mammogram and Dexa scheduled  for 11/03/20  10/12/20- Dr Henrene Pastor PCP, annual wellness visit, menopausal symptoms, follow up 64mos  Recent consult visits:  09/19/20-Dr Lyndel Safe,  Gastroenterology, upper endoscopy  Hospital visits:  None in previous 6 months  Medications: Outpatient Encounter Medications as of 11/17/2020  Medication Sig Note   albuterol (PROVENTIL) (2.5 MG/3ML) 0.083% nebulizer solution Take 3 mLs (2.5 mg total) by nebulization every 6 (six) hours as needed for wheezing or shortness of breath.    albuterol (VENTOLIN HFA) 108 (90 Base) MCG/ACT inhaler INHALE 2 PUFFS INTO THE LUNGS EVERY 6 HOURS AS NEEDED FOR WHEEZING OR SHORTNESS OF BREATH (Patient taking differently: Inhale 2 puffs into the lungs every 6 (six) hours as needed for wheezing or shortness of breath.)    aspirin EC 81 MG tablet Take 1 tablet (81 mg total) by mouth daily. Swallow whole.    Budeson-Glycopyrrol-Formoterol 160-9-4.8 MCG/ACT AERO Inhale 2 puffs into the lungs in the morning and at bedtime. 07/22/2020: BREZTRI   buPROPion (WELLBUTRIN XL) 150 MG 24 hr tablet TAKE 1 TABLET(150 MG) BY MOUTH DAILY (Patient taking differently: Take 150 mg by mouth in the morning.)    cloNIDine (CATAPRES) 0.1 MG tablet Take 1 tablet (0.1 mg total) by mouth 2 (two) times daily.    diazepam (VALIUM) 5 MG tablet TAKE 1 TABLET BY MOUTH EVERY 12 HOURS AS NEEDED FOR ANXIETY. WATCH FOR SEDATION    diclofenac Sodium (VOLTAREN) 1 % GEL Apply 2 g topically 4 (four) times daily.    lisinopril-hydrochlorothiazide (ZESTORETIC) 20-12.5 MG tablet TAKE 2 TABLETS BY MOUTH EVERY DAY    loperamide (IMODIUM) 2 MG capsule Take by mouth.    nitroGLYCERIN (NITROSTAT) 0.4 MG SL tablet Place 1 tablet (0.4 mg total) under the tongue every 5 (five) minutes as needed  for chest pain.    omeprazole (PRILOSEC) 40 MG capsule Take 1 capsule (40 mg total) by mouth in the morning and at bedtime. (Patient taking differently: Take 40 mg by mouth in the morning.)    oxyCODONE-acetaminophen (PERCOCET) 10-325 MG tablet Take 1 tablet by mouth every 6 (six) hours as needed for pain.    promethazine (PHENERGAN) 25 MG tablet Take 1 tablet (25 mg total) by mouth every 6 (six) hours as needed for nausea or vomiting.    rosuvastatin (CRESTOR) 10 MG tablet Take 1 tablet (10 mg total) by mouth daily. (Patient taking differently: Take 10 mg by mouth in the morning.)    traZODone (DESYREL) 100 MG tablet Take 1 tablet (100 mg total) by mouth at bedtime.    Vitamin D, Ergocalciferol, (DRISDOL) 1.25 MG (50000 UNIT) CAPS capsule Take 1 capsule (50,000 Units total) by mouth every 7 (seven) days.    No facility-administered encounter medications on file as of 11/17/2020.     Spoke to patient , she is doing well.  She confirmed that she has left [facility] and went back to [facility] for her medications.  She stated she can get transportation there to pick them up.  Patient stated her blood pressure numbers have improved, she didn't have any specific numbers to give me.  She stated that she watches her diet, and she stays very active.  She stated with the heat her appetite has decreased some, she has been eating cereal and oatmeal.  Patient stated she had her bone density test yesterday, she said she needs to call and reschedule her mammogram, she wasn't feeling well on the previous scheduled date.  Patient will call with any updates, questions or concerns.   Care Gaps: Foot Exam: 10/12/20 Eye Exam:  none noted AWV: 10/12/20  Star Rating Drugs: Lisinopril/HCTZ 09/29/20  60 Rosuvastatin  08/31/20  [place], [place] Pharmacist Assistant [phone]

## 2020-11-18 ENCOUNTER — Encounter: Payer: Self-pay | Admitting: Cardiology

## 2020-11-18 ENCOUNTER — Other Ambulatory Visit: Payer: Self-pay

## 2020-11-18 ENCOUNTER — Ambulatory Visit (INDEPENDENT_AMBULATORY_CARE_PROVIDER_SITE_OTHER): Payer: Medicare Other | Admitting: Cardiology

## 2020-11-18 VITALS — BP 140/86 | HR 62 | Ht 64.0 in | Wt 166.7 lb

## 2020-11-18 DIAGNOSIS — E1169 Type 2 diabetes mellitus with other specified complication: Secondary | ICD-10-CM

## 2020-11-18 DIAGNOSIS — R109 Unspecified abdominal pain: Secondary | ICD-10-CM | POA: Diagnosis not present

## 2020-11-18 DIAGNOSIS — I471 Supraventricular tachycardia: Secondary | ICD-10-CM

## 2020-11-18 DIAGNOSIS — Z8719 Personal history of other diseases of the digestive system: Secondary | ICD-10-CM

## 2020-11-18 DIAGNOSIS — K449 Diaphragmatic hernia without obstruction or gangrene: Secondary | ICD-10-CM

## 2020-11-18 DIAGNOSIS — E782 Mixed hyperlipidemia: Secondary | ICD-10-CM | POA: Diagnosis not present

## 2020-11-18 DIAGNOSIS — I491 Atrial premature depolarization: Secondary | ICD-10-CM | POA: Diagnosis not present

## 2020-11-18 DIAGNOSIS — I2510 Atherosclerotic heart disease of native coronary artery without angina pectoris: Secondary | ICD-10-CM

## 2020-11-18 HISTORY — DX: Atherosclerotic heart disease of native coronary artery without angina pectoris: I25.10

## 2020-11-18 MED ORDER — ISOSORBIDE MONONITRATE ER 30 MG PO TB24: 15.0000 mg | ORAL_TABLET | Freq: Every day | ORAL | 3 refills | Status: DC

## 2020-11-18 NOTE — Progress Notes (Signed)
Cardiology Office Note:    Date:  11/18/2020   ID:  [name], [name] 1946-03-28, MRN [id number]  PCP:  Lillard Anes, MD  Cardiologist:  Berniece Salines, DO  Electrophysiologist:  None   Referring MD: Lillard Anes   No chief complaint on file. I am having some abdominal pain  History of Present Illness:    [name] [name] is a 75 y.o. female with a hx of hypertension, coronary artery disease, tobacco use, mild aortic aneurysmal dilatation, occasional PAC and hiatal hernia is here today for follow-up visit.  I saw the patient in August 04, 2020 at that time she was post hospitalization and had some intermittent chest pain.  I sent the patient for coronary CTA which showed nonobstructive CAD with moderate hiatal hernia.  She is here today she says she has had some intermittent chest discomfort but the most worrisome problem is the fact that she is experiencing abdominal pain.  She notes that when she eats she gets some pain in the abdomen at least once or twice a week.  Past Medical History:  Diagnosis Date   Cataract    Chronic left shoulder pain 09/27/2015   COPD (chronic obstructive pulmonary disease) (HCC)    Enlarged aorta (HCC)    GERD (gastroesophageal reflux disease)    Hyperlipidemia    Hypertension    Osteoarthritis    Pain of right hip joint 11/22/2015   Primary osteoarthritis of left knee 02/04/2015   S/P total knee arthroplasty 02/15/2015   SOB (shortness of breath)     Past Surgical History:  Procedure Laterality Date   ABDOMINAL HYSTERECTOMY     BLADDER SUSPENSION     BREAST BIOPSY Right    COLONOSCOPY  09/04/2011   Colonic polyps, status post polyectomy. Incidental small ascending colon lipoma   ESOPHAGOGASTRODUODENOSCOPY  09/08/2015   Schatzki ring status post esophageal dilitation. Small hiatal hernia   hemorrhoid surgery     KNEE SURGERY     left   TOTAL KNEE ARTHROPLASTY Left 02/15/2015   Procedure: TOTAL KNEE ARTHROPLASTY;  Surgeon: Vickey Huger, MD;  Location: [place];  Service: Orthopedics;  Laterality: Left;   TUBAL LIGATION     WRIST SURGERY     right    Current Medications: Current Meds  Medication Sig   albuterol (PROVENTIL) (2.5 MG/3ML) 0.083% nebulizer solution Take 3 mLs (2.5 mg total) by nebulization every 6 (six) hours as needed for wheezing or shortness of breath.   albuterol (VENTOLIN HFA) 108 (90 Base) MCG/ACT inhaler INHALE 2 PUFFS INTO THE LUNGS EVERY 6 HOURS AS NEEDED FOR WHEEZING OR SHORTNESS OF BREATH   aspirin EC 81 MG tablet Take 1 tablet (81 mg total) by mouth daily. Swallow whole.   Budeson-Glycopyrrol-Formoterol 160-9-4.8 MCG/ACT AERO Inhale 2 puffs into the lungs in the morning and at bedtime.   buPROPion (WELLBUTRIN XL) 150 MG 24 hr tablet TAKE 1 TABLET(150 MG) BY MOUTH DAILY   cloNIDine (CATAPRES) 0.1 MG tablet Take 1 tablet (0.1 mg total) by mouth 2 (two) times daily.   diazepam (VALIUM) 5 MG tablet TAKE 1 TABLET BY MOUTH EVERY 12 HOURS AS NEEDED FOR ANXIETY. WATCH FOR SEDATION   diclofenac Sodium (VOLTAREN) 1 % GEL Apply 2 g topically 4 (four) times daily.   isosorbide mononitrate (IMDUR) 30 MG 24 hr tablet Take 0.5 tablets (15 mg total) by mouth daily.   lisinopril-hydrochlorothiazide (ZESTORETIC) 20-12.5 MG tablet TAKE 2 TABLETS BY MOUTH EVERY DAY   loperamide (IMODIUM)  2 MG capsule Take by mouth.   nitroGLYCERIN (NITROSTAT) 0.4 MG SL tablet Place 1 tablet (0.4 mg total) under the tongue every 5 (five) minutes as needed for chest pain.   omeprazole (PRILOSEC) 40 MG capsule Take 1 capsule (40 mg total) by mouth in the morning and at bedtime.   oxyCODONE-acetaminophen (PERCOCET) 10-325 MG tablet Take 1 tablet by mouth every 6 (six) hours as needed for pain.   promethazine (PHENERGAN) 25 MG tablet Take 1 tablet (25 mg total) by mouth every 6 (six) hours as needed for nausea or vomiting.   rosuvastatin (CRESTOR) 10 MG tablet Take 1 tablet (10 mg total) by mouth daily.   traZODone (DESYREL) 100 MG  tablet Take 1 tablet (100 mg total) by mouth at bedtime.   Vitamin D, Ergocalciferol, (DRISDOL) 1.25 MG (50000 UNIT) CAPS capsule Take 1 capsule (50,000 Units total) by mouth every 7 (seven) days.     Allergies:   Meperidine, Meperidine hcl, Influenza vaccines, and Other   Social History   Socioeconomic History   Marital status: Widowed    Spouse name: Not on file   Number of children: 4   Years of education: Not on file   Highest education level: Not on file  Occupational History   Occupation: Retired  Tobacco Use   Smoking status: Every Day    Packs/day: 1.50    Years: 47.00    Pack years: 70.50    Types: Cigarettes   Smokeless tobacco: Never   Tobacco comments:    down to 1 pack a day  Vaping Use   Vaping Use: Never used  Substance and Sexual Activity   Alcohol use: No   Drug use: No   Sexual activity: Not Currently  Other Topics Concern   Not on file  Social History Narrative   Not on file   Social Determinants of Health   Financial Resource Strain: Low Risk    Difficulty of Paying Living Expenses: Not hard at all  Food Insecurity: No Food Insecurity   Worried About Charity fundraiser in the Last Year: Never true   [place] in the Last Year: Never true  Transportation Needs: Unknown   Lack of Transportation (Medical): No   Lack of Transportation (Non-Medical): Not on file  Physical Activity: Insufficiently Active   Days of Exercise per Week: 2 days   Minutes of Exercise per Session: 20 min  Stress: Stress Concern Present   Feeling of Stress : To some extent  Social Connections: Socially Isolated   Frequency of Communication with Friends and Family: Three times a week   Frequency of Social Gatherings with Friends and Family: Never   Attends Religious Services: Never   Marine scientist or Organizations: No   Attends Archivist Meetings: Never   Marital Status: Widowed     Family History: The patient's family history includes COPD  in her son; Cancer in her father; Diabetes in her father; Heart disease in her father and mother; Lupus in her sister; Seizures in her son; Stroke in her father. There is no history of Colon cancer, Esophageal cancer, Stomach cancer, or Rectal cancer.  ROS:   Review of Systems  Constitution: Negative for decreased appetite, fever and weight gain.  HENT: Negative for congestion, ear discharge, hoarse voice and sore throat.   Eyes: Negative for discharge, redness, vision loss in right eye and visual halos.  Cardiovascular: Negative for chest pain, dyspnea on exertion, leg swelling, orthopnea  and palpitations.  Respiratory: Negative for cough, hemoptysis, shortness of breath and snoring.   Endocrine: Negative for heat intolerance and polyphagia.  Hematologic/Lymphatic: Negative for bleeding problem. Does not bruise/bleed easily.  Skin: Negative for flushing, nail changes, rash and suspicious lesions.  Musculoskeletal: Negative for arthritis, joint pain, muscle cramps, myalgias, neck pain and stiffness.  Gastrointestinal: Negative for abdominal pain, bowel incontinence, diarrhea and excessive appetite.  Genitourinary: Negative for decreased libido, genital sores and incomplete emptying.  Neurological: Negative for brief paralysis, focal weakness, headaches and loss of balance.  Psychiatric/Behavioral: Negative for altered mental status, depression and suicidal ideas.  Allergic/Immunologic: Negative for HIV exposure and persistent infections.    EKGs/Labs/Other Studies Reviewed:    The following studies were reviewed today:   EKG:  None today  Coronary CTA August 24, 2020 Aorta: Normal size. Mild aortic root calcifications. No dissection.   Aortic Valve:  Trileaflet.  Mild Calcifications.   Coronary Arteries:  Normal coronary origin.  Right dominance.   RCA is a large dominant artery that gives rise to PDA and PLA. There is a mild (24-49%) calcified plaque in the RCA. The mid RCA  with minimal (<24%) calcified plaque. The distal RCA with no plaque.   Left main is a large artery that gives rise to LAD and LCX arteries.   LAD is a large vessel. There is a long (13.4 mm) proximal mild calcified lesion. The mid LAD with mild mixed plaque. The distal LAD with no plaques.   LCX is a non-dominant artery that gives rise to one large OM1 branch. There is minimal calcified plaque in the LCX. The mid and distal LCX with no plaques.   Other findings:   Normal pulmonary vein drainage into the left atrium.   Normal left atrial appendage without a thrombus.   Normal size of the pulmonary artery.   IMPRESSION: 1. Coronary calcium score of 509. This was 77 percentile for age and sex matched control.   2. Normal coronary origin with right dominance.   3. Mild CAD. CAD-RADS 2. Mild non-obstructive CAD (25-49%). Consider non-atherosclerotic causes of chest pain. Consider preventive therapy and risk factor modification.   4. Aortic Atherosclerosis.   Zio monitor  The patient wore the monitor for 6 days 18 hours starting 11/06/2019. Indication: Palpitations The minimum heart rate was 47 bpm, maximum heart rate was 176 bpm, and average heart rate was 72 bpm. Predominant underlying rhythm was Sinus Rhythm.   22 Supraventricular Tachycardia runs occurred, the run with the fastest interval lasting 7 beats with a max rate of 176 bpm, the longest lasting 18 beats with an avg rate of 122 bpm.   Premature atrial complexes  were occasional (1.1%,7818). Premature Ventricular complexes were rare (<1.0%). Ventricular Bigeminy was present.   No ventricular tachycardia, no pauses, No AV block and no atrial fibrillation present.   4 patient triggered events all associated with PACs.   Conclusion: This study is remarkable for the following:                             1. Paroxysmal supraventricular tachycardia which is likely atrial tachycardia with variable block.                              2. Occasional premature atrial complexes.   Echocardiogram IMPRESSIONS   1. Left ventricular ejection fraction, by estimation, is 60 to 65%. The  left ventricle has normal function. The left ventricle has no regional  wall motion abnormalities. Left ventricular diastolic parameters are  consistent with Grade II diastolic  dysfunction (pseudonormalization).   2. Right ventricular systolic function is normal. The right ventricular  size is normal. There is mildly elevated pulmonary artery systolic  pressure.   3. Left atrial size was mildly dilated.   4. The mitral valve is normal in structure. Mild mitral valve  regurgitation. No evidence of mitral stenosis.   5. The aortic valve is normal in structure. Aortic valve regurgitation is  not visualized. Mild to moderate aortic valve sclerosis/calcification is  present, without any evidence of aortic stenosis.   6. There is mild dilatation of the ascending aorta measuring 39 mm.   7. The inferior vena cava is normal in size with greater than 50%  respiratory variability, suggesting right atrial pressure of 3   Recent Labs: 07/14/2020: Platelets 162 07/22/2020: ALT 13; B Natriuretic Peptide 19.7; Hemoglobin 13.3 08/20/2020: BUN 11; Creatinine, Ser 0.73; Magnesium 2.0; Potassium 3.8; Sodium 138  Recent Lipid Panel    Component Value Date/Time   CHOL 167 07/14/2020 1442   TRIG 51 07/14/2020 1442   HDL 57 07/14/2020 1442   CHOLHDL 2.9 07/14/2020 1442   LDLCALC 100 (H) 07/14/2020 1442    Physical Exam:    VS:  BP 140/86   Pulse 62   Ht 5\' 4"  (1.626 m)   Wt 166 lb 11.2 oz (75.6 kg)   SpO2 96%   BMI 28.61 kg/m     Wt Readings from Last 3 Encounters:  11/18/20 166 lb 11.2 oz (75.6 kg)  10/12/20 165 lb (74.8 kg)  09/22/20 165 lb (74.8 kg)     GEN: Well nourished, well developed in no acute distress HEENT: Normal NECK: No JVD; No carotid bruits LYMPHATICS: No lymphadenopathy CARDIAC: S1S2 noted,RRR, no murmurs, rubs,  gallops RESPIRATORY:  Clear to auscultation without rales, wheezing or rhonchi  ABDOMEN: Soft, non-tender, non-distended, +bowel sounds, no guarding. EXTREMITIES: No edema, No cyanosis, no clubbing MUSCULOSKELETAL:  No deformity  SKIN: Warm and dry NEUROLOGIC:  Alert and oriented x 3, non-focal PSYCHIATRIC:  Normal affect, good insight  ASSESSMENT:    1. Coronary artery disease involving native coronary artery of native heart without angina pectoris   2. PAC (premature atrial contraction)   3. PAT (paroxysmal atrial tachycardia) ([place])   4. Type 2 diabetes mellitus with other specified complication, without long-term current use of insulin ([place])   5. Mixed hyperlipidemia   6. Hiatal hernia   7. Abdominal pain, unspecified abdominal location   8. History of hiatal hernia    PLAN:     1.  She is experiencing abdominal pain which is concerning given her history of moderate hiatal hernia I am going to refer the patient to surgery.  We have asked for urgent evaluation. In terms of her intermittent chest pain with her nonobstructive CAD I will start low-dose Imdur 15 mg daily, she will continue her aspirin as well as statin. Hyperlipidemia - continue with current statin medication. Hyperlipidemia - continue with current statin medication.  The patient is in agreement with the above plan. The patient left the office in stable condition.  The patient will follow up in 6 months or sooner if needed   Medication Adjustments/Labs and Tests Ordered: Current medicines are reviewed at length with the patient today.  Concerns regarding medicines are outlined above.  Orders Placed This Encounter  Procedures   Basic metabolic  panel   CBC with Differential/Platelet   Magnesium   Ambulatory referral to General Surgery    Meds ordered this encounter  Medications   isosorbide mononitrate (IMDUR) 30 MG 24 hr tablet    Sig: Take 0.5 tablets (15 mg total) by mouth daily.    Dispense:  45 tablet     Refill:  3     Patient Instructions  Medication Instructions:   Your physician has recommended you make the following change in your medication:  START: Imdur 15 mg once daily   *If you need a refill on your cardiac medications before your next appointment, please call your pharmacy*   Lab Work:  Your physician recommends that you return for lab work in:  TODAY: BMET, Mag, CBC  If you have labs (blood work) drawn today and your tests are completely normal, you will receive your results only by: MyChart Message (if you have MyChart) OR A paper copy in the mail If you have any lab test that is abnormal or we need to change your treatment, we will call you to review the results.   Testing/Procedures:  None   Follow-Up: At [facility], you and your health needs are our priority.  As part of our continuing mission to provide you with exceptional heart care, we have created designated Provider Care Teams.  These Care Teams include your primary Cardiologist (physician) and Advanced Practice Providers (APPs -  Physician Assistants and Nurse Practitioners) who all work together to provide you with the care you need, when you need it.  We recommend signing up for the patient portal called "MyChart".  Sign up information is provided on this After Visit Summary.  MyChart is used to connect with patients for Virtual Visits (Telemedicine).  Patients are able to view lab/test results, encounter notes, upcoming appointments, etc.  Non-urgent messages can be sent to your provider as well.   To learn more about what you can do with MyChart, go to [email or web address].    Your next appointment:   6 month(s)  The format for your next appointment:   In Person  Provider:   [address] -Berniece Salines, DO   Other Instructions    Adopting a Healthy Lifestyle.  Know what a healthy weight is for you (roughly BMI <25) and aim to maintain this   Aim for 7+ servings of fruits and  vegetables daily   65-80+ fluid ounces of water or unsweet tea for healthy kidneys   Limit to max 1 drink of alcohol per day; avoid smoking/tobacco   Limit animal fats in diet for cholesterol and heart health - choose grass fed whenever available   Avoid highly processed foods, and foods high in saturated/trans fats   Aim for low stress - take time to unwind and care for your mental health   Aim for 150 min of moderate intensity exercise weekly for heart health, and weights twice weekly for bone health   Aim for 7-9 hours of sleep daily   When it comes to diets, agreement about the perfect plan isnt easy to find, even among the experts. Experts at the [place] developed an idea known as the Healthy Eating Plate. Just imagine a plate divided into logical, healthy portions.   The emphasis is on diet quality:   Load up on vegetables and fruits - one-half of your plate: Aim for color and variety, and remember that potatoes dont count.   Go for whole grains - one-quarter of  your plate: Whole wheat, barley, wheat berries, quinoa, oats, brown rice, and foods made with them. If you want pasta, go with whole wheat pasta.   Protein power - one-quarter of your plate: Fish, chicken, beans, and nuts are all healthy, versatile protein sources. Limit red meat.   The diet, however, does go beyond the plate, offering a few other suggestions.   Use healthy plant oils, such as olive, canola, soy, corn, sunflower and peanut. Check the labels, and avoid partially hydrogenated oil, which have unhealthy trans fats.   If youre thirsty, drink water. Coffee and tea are good in moderation, but skip sugary drinks and limit milk and dairy products to one or two daily servings.   The type of carbohydrate in the diet is more important than the amount. Some sources of carbohydrates, such as vegetables, fruits, whole grains, and beans-are healthier than others.   Finally, stay  active  Signed, Berniece Salines, DO  11/18/2020 2:31 PM    [facility]  Medication Adjustments/Labs and Tests Ordered: Current medicines are reviewed at length with the patient today.  Concerns regarding medicines are outlined above.  Orders Placed This Encounter  Procedures   Basic metabolic panel   CBC with Differential/Platelet   Magnesium   Ambulatory referral to General Surgery    Meds ordered this encounter  Medications   isosorbide mononitrate (IMDUR) 30 MG 24 hr tablet    Sig: Take 0.5 tablets (15 mg total) by mouth daily.    Dispense:  45 tablet    Refill:  3     Patient Instructions  Medication Instructions:   Your physician has recommended you make the following change in your medication:  START: Imdur 15 mg once daily   *If you need a refill on your cardiac medications before your next appointment, please call your pharmacy*   Lab Work:  Your physician recommends that you return for lab work in:  TODAY: BMET, Mag, CBC  If you have labs (blood work) drawn today and your tests are completely normal, you will receive your results only by: MyChart Message (if you have MyChart) OR A paper copy in the mail If you have any lab test that is abnormal or we need to change your treatment, we will call you to review the results.   Testing/Procedures:  None   Follow-Up: At [facility], you and your health needs are our priority.  As part of our continuing mission to provide you with exceptional heart care, we have created designated Provider Care Teams.  These Care Teams include your primary Cardiologist (physician) and Advanced Practice Providers (APPs -  Physician Assistants and Nurse Practitioners) who all work together to provide you with the care you need, when you need it.  We recommend signing up for the patient portal called "MyChart".  Sign up information is provided on this After Visit Summary.  MyChart is used to connect with  patients for Virtual Visits (Telemedicine).  Patients are able to view lab/test results, encounter notes, upcoming appointments, etc.  Non-urgent messages can be sent to your provider as well.   To learn more about what you can do with MyChart, go to [email or web address].    Your next appointment:   6 month(s)  The format for your next appointment:   In Person  Provider:   [address] -Berniece Salines, DO   Other Instructions    Adopting a Healthy Lifestyle.  Know what a healthy weight is for you (roughly BMI <  25) and aim to maintain this   Aim for 7+ servings of fruits and vegetables daily   65-80+ fluid ounces of water or unsweet tea for healthy kidneys   Limit to max 1 drink of alcohol per day; avoid smoking/tobacco   Limit animal fats in diet for cholesterol and heart health - choose grass fed whenever available   Avoid highly processed foods, and foods high in saturated/trans fats   Aim for low stress - take time to unwind and care for your mental health   Aim for 150 min of moderate intensity exercise weekly for heart health, and weights twice weekly for bone health   Aim for 7-9 hours of sleep daily   When it comes to diets, agreement about the perfect plan isnt easy to find, even among the experts. Experts at the [place] developed an idea known as the Healthy Eating Plate. Just imagine a plate divided into logical, healthy portions.   The emphasis is on diet quality:   Load up on vegetables and fruits - one-half of your plate: Aim for color and variety, and remember that potatoes dont count.   Go for whole grains - one-quarter of your plate: Whole wheat, barley, wheat berries, quinoa, oats, brown rice, and foods made with them. If you want pasta, go with whole wheat pasta.   Protein power - one-quarter of your plate: Fish, chicken, beans, and nuts are all healthy, versatile protein sources. Limit red meat.   The diet, however, does go  beyond the plate, offering a few other suggestions.   Use healthy plant oils, such as olive, canola, soy, corn, sunflower and peanut. Check the labels, and avoid partially hydrogenated oil, which have unhealthy trans fats.   If youre thirsty, drink water. Coffee and tea are good in moderation, but skip sugary drinks and limit milk and dairy products to one or two daily servings.   The type of carbohydrate in the diet is more important than the amount. Some sources of carbohydrates, such as vegetables, fruits, whole grains, and beans-are healthier than others.   Finally, stay active  Signed, Berniece Salines, DO  11/18/2020 2:31 PM    [facility]

## 2020-11-18 NOTE — Patient Instructions (Signed)
Medication Instructions:   Your physician has recommended you make the following change in your medication:  START: Imdur 15 mg once daily   *If you need a refill on your cardiac medications before your next appointment, please call your pharmacy*   Lab Work:  Your physician recommends that you return for lab work in:  TODAY: BMET, Mag, CBC  If you have labs (blood work) drawn today and your tests are completely normal, you will receive your results only by: MyChart Message (if you have MyChart) OR A paper copy in the mail If you have any lab test that is abnormal or we need to change your treatment, we will call you to review the results.   Testing/Procedures:  None   Follow-Up: At [facility], you and your health needs are our priority.  As part of our continuing mission to provide you with exceptional heart care, we have created designated Provider Care Teams.  These Care Teams include your primary Cardiologist (physician) and Advanced Practice Providers (APPs -  Physician Assistants and Nurse Practitioners) who all work together to provide you with the care you need, when you need it.  We recommend signing up for the patient portal called "MyChart".  Sign up information is provided on this After Visit Summary.  MyChart is used to connect with patients for Virtual Visits (Telemedicine).  Patients are able to view lab/test results, encounter notes, upcoming appointments, etc.  Non-urgent messages can be sent to your provider as well.   To learn more about what you can do with MyChart, go to [email or web address].    Your next appointment:   6 month(s)  The format for your next appointment:   In Person  Provider:   [address] -Berniece Salines, DO   Other Instructions

## 2020-11-19 ENCOUNTER — Other Ambulatory Visit: Payer: Self-pay | Admitting: Legal Medicine

## 2020-11-19 ENCOUNTER — Telehealth: Payer: Self-pay

## 2020-11-19 LAB — CBC WITH DIFFERENTIAL/PLATELET
Basophils Absolute: 0.1 10*3/uL (ref 0.0–0.2)
Basos: 1 %
EOS (ABSOLUTE): 0.1 10*3/uL (ref 0.0–0.4)
Eos: 2 %
Hematocrit: 42.3 % (ref 34.0–46.6)
Hemoglobin: 13.9 g/dL (ref 11.1–15.9)
Immature Grans (Abs): 0 10*3/uL (ref 0.0–0.1)
Immature Granulocytes: 0 %
Lymphocytes Absolute: 2.3 10*3/uL (ref 0.7–3.1)
Lymphs: 36 %
MCH: 31.2 pg (ref 26.6–33.0)
MCHC: 32.9 g/dL (ref 31.5–35.7)
MCV: 95 fL (ref 79–97)
Monocytes Absolute: 0.5 10*3/uL (ref 0.1–0.9)
Monocytes: 9 %
Neutrophils Absolute: 3.2 10*3/uL (ref 1.4–7.0)
Neutrophils: 52 %
Platelets: 197 10*3/uL (ref 150–450)
RBC: 4.46 x10E6/uL (ref 3.77–5.28)
RDW: 13.2 % (ref 11.7–15.4)
WBC: 6.2 10*3/uL (ref 3.4–10.8)

## 2020-11-19 LAB — BASIC METABOLIC PANEL
BUN/Creatinine Ratio: 19 (ref 12–28)
BUN: 14 mg/dL (ref 8–27)
CO2: 28 mmol/L (ref 20–29)
Calcium: 9.9 mg/dL (ref 8.7–10.3)
Chloride: 99 mmol/L (ref 96–106)
Creatinine, Ser: 0.75 mg/dL (ref 0.57–1.00)
Glucose: 85 mg/dL (ref 65–99)
Potassium: 4.1 mmol/L (ref 3.5–5.2)
Sodium: 138 mmol/L (ref 134–144)
eGFR: 83 mL/min/{1.73_m2} (ref >59)

## 2020-11-19 LAB — MAGNESIUM: Magnesium: 2.1 mg/dL (ref 1.6–2.3)

## 2020-11-19 NOTE — Telephone Encounter (Signed)
Left message on patients voicemail to please return our call.   

## 2020-11-19 NOTE — Telephone Encounter (Signed)
-----   Message from Berniece Salines, DO sent at 11/19/2020  1:03 PM EDT ----- Labs normal

## 2020-12-13 ENCOUNTER — Ambulatory Visit: Payer: Self-pay | Admitting: Surgery

## 2020-12-20 ENCOUNTER — Telehealth: Payer: Self-pay

## 2020-12-20 ENCOUNTER — Other Ambulatory Visit: Payer: Self-pay

## 2020-12-20 ENCOUNTER — Ambulatory Visit (INDEPENDENT_AMBULATORY_CARE_PROVIDER_SITE_OTHER): Payer: Medicare Other | Admitting: Surgery

## 2020-12-20 ENCOUNTER — Encounter: Payer: Self-pay | Admitting: Surgery

## 2020-12-20 VITALS — BP 161/84 | HR 69 | Temp 98.2°F | Ht 62.0 in | Wt 172.0 lb

## 2020-12-20 DIAGNOSIS — K449 Diaphragmatic hernia without obstruction or gangrene: Secondary | ICD-10-CM

## 2020-12-20 NOTE — Telephone Encounter (Signed)
Faxed cardiac clearance to Dr. Berniece Salines at [phone].

## 2020-12-20 NOTE — Telephone Encounter (Signed)
Faxed pulmonary clearance to Dr. Lenice Llamas at [phone].

## 2020-12-20 NOTE — Patient Instructions (Addendum)
We will get you scheduled for a Barium Swallow Study. We will call you about these appointments.   You will need to call your Pulmonary doctor for a follow up before you follow up with [place] so they can see if you are alright for surgery.   We will get clearances from your Pulmonary and Cardiology doctors.   Follow up here in 3-4 weeks.

## 2020-12-21 ENCOUNTER — Encounter: Payer: Self-pay | Admitting: Surgery

## 2020-12-21 ENCOUNTER — Telehealth: Payer: Self-pay

## 2020-12-21 NOTE — Telephone Encounter (Signed)
   Primary Cardiologist: Berniece Salines, DO  Chart reviewed as part of pre-operative protocol coverage. Given past medical history and time since last visit, based on ACC/AHA guidelines, [name] [name] would be at acceptable risk for the planned procedure without further cardiovascular testing.    I will route this recommendation to the requesting party via Epic fax function and remove from pre-op pool.  Please call with questions.  Jossie Ng. Brynne Doane NP-C    12/21/2020, 1:12 PM [facility] [address] Office [phone] Fax [phone]

## 2020-12-21 NOTE — Progress Notes (Signed)
Patient ID: [name] [name], female   DOB: 1945-10-29, 75 y.o.   MRN: [id number]  HPI [name] [name] is a 75 y.o. female seen in consultation for evaluation of paraesophageal hernia.  She does have significant history of COPD, CAD with PAC and atypical chest pain.  She reports some intermittent upper abdominal discomfort and retrosternal pain.  Has this episode couple times a week.  Cardiology has evaluated her and perform a CTA of the coronaries and deemed that the pain was not cardiac in origin.  She did have a CT that I have personally reviewed showing evidence of a moderate type III paraesophageal hernia.  Patient does report significant reflux and chronic cough.  Reflux is worsened when she lays down.  Reflux is only partial relief with PPIs.  She continues to smokes daily. Is a walker.  She does have some dyspnea on exertion.  He did have a prior history of abdominal hysterectomy and tubal ligation. She did have an endoscopic evaluation 3 months ago that I have personally reviewed there was evidence of a hiatal hernia with a Schatzki ring that required dilation. Recent lab work revealed normal CBC and BMP. He does use inhalers but no oxygen yet.  HPI  Past Medical History:  Diagnosis Date   Cataract    Chronic left shoulder pain 09/27/2015   COPD (chronic obstructive pulmonary disease) (HCC)    Enlarged aorta (HCC)    GERD (gastroesophageal reflux disease)    Hyperlipidemia    Hypertension    Osteoarthritis    Pain of right hip joint 11/22/2015   Primary osteoarthritis of left knee 02/04/2015   S/P total knee arthroplasty 02/15/2015   SOB (shortness of breath)     Past Surgical History:  Procedure Laterality Date   ABDOMINAL HYSTERECTOMY     BLADDER SUSPENSION     BREAST BIOPSY Right    COLONOSCOPY  09/04/2011   Colonic polyps, status post polyectomy. Incidental small ascending colon lipoma   ESOPHAGOGASTRODUODENOSCOPY  09/08/2015   Schatzki ring status post esophageal dilitation. Small  hiatal hernia   hemorrhoid surgery     KNEE SURGERY     left   TOTAL KNEE ARTHROPLASTY Left 02/15/2015   Procedure: TOTAL KNEE ARTHROPLASTY;  Surgeon: Vickey Huger, MD;  Location: [facility];  Service: Orthopedics;  Laterality: Left;   TUBAL LIGATION     WRIST SURGERY     right    Family History  Problem Relation Age of Onset   Stroke Mother    Heart disease Mother    COPD Father    Cancer Father        Bone   Diabetes Father    Heart disease Father    Stroke Father    Lupus Sister    Seizures Son    COPD Son    Colon cancer Neg Hx    Esophageal cancer Neg Hx    Stomach cancer Neg Hx    Rectal cancer Neg Hx     Social History Social History   Tobacco Use   Smoking status: Every Day    Packs/day: 1.50    Years: 47.00    Pack years: 70.50    Types: Cigarettes   Smokeless tobacco: Never   Tobacco comments:    down to 1 pack a day  Vaping Use   Vaping Use: Never used  Substance Use Topics   Alcohol use: No   Drug use: No    Allergies  Allergen Reactions   Meperidine  Anaphylaxis and Other (See Comments)    Blood pressure dropped, also   Meperidine Hcl Anaphylaxis and Other (See Comments)    B/P dropped, also   Influenza Vaccines Other (See Comments)    Flu-like symptoms   Other Rash and Other (See Comments)    asprage    Current Outpatient Medications  Medication Sig Dispense Refill   albuterol (PROVENTIL) (2.5 MG/3ML) 0.083% nebulizer solution Take 3 mLs (2.5 mg total) by nebulization every 6 (six) hours as needed for wheezing or shortness of breath. 75 mL 12   albuterol (VENTOLIN HFA) 108 (90 Base) MCG/ACT inhaler INHALE 2 PUFFS INTO THE LUNGS EVERY 6 HOURS AS NEEDED FOR WHEEZING OR SHORTNESS OF BREATH 8.5 g 6   aspirin EC 81 MG tablet Take 1 tablet (81 mg total) by mouth daily. Swallow whole. 90 tablet 3   Budeson-Glycopyrrol-Formoterol 160-9-4.8 MCG/ACT AERO Inhale 2 puffs into the lungs in the morning and at bedtime. 10.7 g 6   buPROPion (WELLBUTRIN XL) 150  MG 24 hr tablet TAKE 1 TABLET(150 MG) BY MOUTH DAILY 90 tablet 2   cloNIDine (CATAPRES) 0.1 MG tablet Take 1 tablet (0.1 mg total) by mouth 2 (two) times daily. 180 tablet 3   diazepam (VALIUM) 5 MG tablet TAKE 1 TABLET BY MOUTH EVERY 12 HOURS AS NEEDED FOR ANXIETY. WATCH FOR SEDATION 60 tablet 3   diclofenac Sodium (VOLTAREN) 1 % GEL Apply 2 g topically 4 (four) times daily. 50 g 0   isosorbide mononitrate (IMDUR) 30 MG 24 hr tablet Take 0.5 tablets (15 mg total) by mouth daily. 45 tablet 3   lisinopril-hydrochlorothiazide (ZESTORETIC) 20-12.5 MG tablet TAKE TWO TABLETS BY MOUTH EVERY MORNING 180 tablet 1   loperamide (IMODIUM) 2 MG capsule Take 2 mg by mouth as needed.     omeprazole (PRILOSEC) 40 MG capsule Take 1 capsule (40 mg total) by mouth in the morning and at bedtime. (Patient taking differently: Take 40 mg by mouth daily.) 60 capsule 0   oxyCODONE-acetaminophen (PERCOCET) 10-325 MG tablet Take 1 tablet by mouth every 6 (six) hours as needed for pain.     promethazine (PHENERGAN) 25 MG tablet Take 1 tablet (25 mg total) by mouth every 6 (six) hours as needed for nausea or vomiting. 30 tablet 3   traZODone (DESYREL) 100 MG tablet Take 1 tablet (100 mg total) by mouth at bedtime.     Vitamin D, Ergocalciferol, (DRISDOL) 1.25 MG (50000 UNIT) CAPS capsule TAKE ONE CAPSULE BY MOUTH every SEVEN DAYS 12 capsule 1   nitroGLYCERIN (NITROSTAT) 0.4 MG SL tablet Place 1 tablet (0.4 mg total) under the tongue every 5 (five) minutes as needed for chest pain. 25 tablet 3   rosuvastatin (CRESTOR) 10 MG tablet Take 1 tablet (10 mg total) by mouth daily. 90 tablet 3   No current facility-administered medications for this visit.     Review of Systems Full ROS  was asked and was negative except for the information on the HPI  Physical Exam Blood pressure (!) 161/84, pulse 69, temperature 98.2 F (36.8 C), height '5\' 2"'$  (1.575 m), weight 172 lb (78 kg), SpO2 98 %. CONSTITUTIONAL: NAD, appears  chronically ill EYES: Pupils are equal, round,  Sclera are non-icteric. EARS, NOSE, MOUTH AND THROAT: She is wearing a mask hearing is intact to voice. LYMPH NODES:  Lymph nodes in the neck are normal. RESPIRATORY:  Lungs are clear. There is normal respiratory effort, with equal breath sounds bilaterally, bilateral wheezing CARDIOVASCULAR: Heart is regular  without murmurs, gallops, or rubs. GI: The abdomen is soft, mild tenderness palpation epigastric area , and nondistended. There are no palpable masses. There is no hepatosplenomegaly. There are normal bowel sounds in all quadrants. GU: Rectal deferred.   MUSCULOSKELETAL: Normal muscle strength and tone. No cyanosis or edema.   SKIN: Turgor is good and there are no pathologic skin lesions or ulcers. NEUROLOGIC: Motor and sensation is grossly normal. Cranial nerves are grossly intact. PSYCH:  Oriented to person, place and time. Affect is normal.  Data Reviewed  I have personally reviewed the patient's imaging, laboratory findings and medical records.    Assessment/Plan 75 year old female with a type III paraesophageal hernia that is symptomatic.  She has significant comorbidities to include COPD, coronary artery disease, A. fib with a pacemaker.  She already completed endoscopic evaluation and she already has a CT scan that I have personally reviewed them.  The only thing she will need is a barium swallow.  She wishes to continue work-up and is very interested in repair of paraesophageal hernia.  An extensive discussion with patient regarding her disease process and options.  Discussed with her about surgical intervention of paraesophageal hernia via robotic approach and she seems to understand the rationale and thought process. Given that she has significant pulmonary and cardiac issues we will go ahead and request preoperative optimization by both pulmonary and cardiology. Pacifically had a discussion with patient regarding her abdominal pain  although some of it can be explained by the paraesophageal hernia I told her that I cannot guarantee that fixing her hernia will take away her pain. Time spent with the patient was 60 minutes, with more than 50% of the time spent in face-to-face education, counseling and care coordination.     Caroleen Hamman, MD FACS General Surgeon 12/21/2020, 1:47 PM

## 2020-12-21 NOTE — Telephone Encounter (Signed)
   [facility] Pre-operative Risk Assessment    Request for surgical clearance:  What type of surgery is being performed? Robotic Repair of Hernia Hiatal    When is this surgery scheduled? TBD   What type of clearance is required (medical clearance vs. Pharmacy clearance to hold med vs. Both)? Both  Are there any medications that need to be held prior to surgery and how long?Not specified however, the patient is on ASA 84   Practice name and name of physician performing surgery? Dr. Caroleen Hamman at [facility]    What is your office phone number: [phone]    7.   What is your office fax number: [phone]  8.   Anesthesia type (None, local, MAC, general) ? General Anesthesia    [name] [name] 12/21/2020, 12:59 PM  _________________________________________________________________   (provider comments below)

## 2020-12-22 ENCOUNTER — Telehealth: Payer: Self-pay | Admitting: Internal Medicine

## 2020-12-22 NOTE — Progress Notes (Signed)
Cardiac Clearance received from [facility], NP-C. The patient is at acceptable risk for surgery without further cardiovascular testing. All notes are in Epic.

## 2020-12-22 NOTE — Telephone Encounter (Signed)
Received Surgical clearance form from [place]. Dr. Caroleen Hamman wants to do a robotic repair of hiatal hernia under general anesthisa for this patient. No surgery date yet. Patient has appointment with Dr. Shearon Stalls on 01/04/21 and PFTs 12/31/20.  Sending to Dr. Shearon Stalls as Juluis Rainier Form is in [name] [name]'s office. Please let her know when note is done so she can fax to the surgeons office.

## 2020-12-22 NOTE — Telephone Encounter (Signed)
ATC pt. VM box is full. WCB.  

## 2020-12-23 ENCOUNTER — Telehealth: Payer: Self-pay

## 2020-12-23 NOTE — Telephone Encounter (Signed)
Patient will be coming in to the office for nurse visit to have PCR send out COVID test done prior to surgery. Per Dr. [name] this is ok for patient to do. LA

## 2020-12-23 NOTE — Telephone Encounter (Signed)
Called and spoke to pt. Pt states she needs a COVID 19 test prior to her PFT but would like to have it at her PCP office, Dr. Henrene Pastor. Called Dr. Blanch Media office and spoke to Ander Purpura, RN, and was advised that they do not need an order. Lauren is aware to contact our office if an order is needed.   Called and spoke to pt. Informed her of the information. Pt verbalized understanding and denied any further questions or concerns at this time.    Will forward to [place] as FYI for PFT.

## 2020-12-24 ENCOUNTER — Ambulatory Visit
Admission: RE | Admit: 2020-12-24 | Discharge: 2020-12-24 | Disposition: A | Discharge status: Home / Self Care | Payer: Medicare Other | Source: Ambulatory Visit | Attending: Surgery | Admitting: Surgery

## 2020-12-24 ENCOUNTER — Other Ambulatory Visit: Payer: Self-pay

## 2020-12-24 DIAGNOSIS — K449 Diaphragmatic hernia without obstruction or gangrene: Secondary | ICD-10-CM | POA: Diagnosis not present

## 2020-12-24 DIAGNOSIS — R1314 Dysphagia, pharyngoesophageal phase: Secondary | ICD-10-CM | POA: Diagnosis not present

## 2020-12-24 DIAGNOSIS — K219 Gastro-esophageal reflux disease without esophagitis: Secondary | ICD-10-CM | POA: Diagnosis not present

## 2020-12-27 NOTE — Telephone Encounter (Signed)
Spoke with patient-she is scheduled to see Pulmonary 8/9 and will call to schedule an appointment with Cardiology. She was asked to have all appointments done prior to seeing Dr.Pabon 02/02/21. Results of barium swallow was provided.

## 2020-12-28 ENCOUNTER — Other Ambulatory Visit: Payer: Self-pay

## 2020-12-28 ENCOUNTER — Ambulatory Visit: Payer: Medicare Other

## 2020-12-28 DIAGNOSIS — Z20822 Contact with and (suspected) exposure to covid-19: Secondary | ICD-10-CM

## 2020-12-28 DIAGNOSIS — Z01812 Encounter for preprocedural laboratory examination: Secondary | ICD-10-CM | POA: Diagnosis not present

## 2020-12-29 LAB — SARS-COV-2, NAA 2 DAY TAT

## 2020-12-29 LAB — NOVEL CORONAVIRUS, NAA: SARS-CoV-2, NAA: NOT DETECTED

## 2020-12-30 NOTE — Progress Notes (Signed)
Negative ovid lp

## 2020-12-31 ENCOUNTER — Other Ambulatory Visit: Payer: Self-pay

## 2020-12-31 ENCOUNTER — Ambulatory Visit (INDEPENDENT_AMBULATORY_CARE_PROVIDER_SITE_OTHER): Payer: Medicare Other | Admitting: Internal Medicine

## 2020-12-31 DIAGNOSIS — F322 Major depressive disorder, single episode, severe without psychotic features: Secondary | ICD-10-CM

## 2020-12-31 DIAGNOSIS — J449 Chronic obstructive pulmonary disease, unspecified: Secondary | ICD-10-CM | POA: Diagnosis not present

## 2020-12-31 LAB — PULMONARY FUNCTION TEST
DL/VA % pred: 108 %
DL/VA: 4.45 ml/min/mmHg/L
DLCO cor % pred: 86 %
DLCO cor: 16.26 ml/min/mmHg
DLCO unc % pred: 86 %
DLCO unc: 16.26 ml/min/mmHg
FEF 25-75 Post: 0.92 L/sec
FEF 25-75 Pre: 0.55 L/sec
FEF2575-%Change-Post: 65 %
FEF2575-%Pred-Post: 56 %
FEF2575-%Pred-Pre: 34 %
FEV1-%Change-Post: 17 %
FEV1-%Pred-Post: 54 %
FEV1-%Pred-Pre: 46 %
FEV1-Post: 1.12 L
FEV1-Pre: 0.95 L
FEV1FVC-%Change-Post: 3 %
FEV1FVC-%Pred-Pre: 87 %
FEV6-%Change-Post: 13 %
FEV6-%Pred-Post: 63 %
FEV6-%Pred-Pre: 55 %
FEV6-Post: 1.66 L
FEV6-Pre: 1.46 L
FEV6FVC-%Pred-Post: 105 %
FEV6FVC-%Pred-Pre: 105 %
FVC-%Change-Post: 13 %
FVC-%Pred-Post: 60 %
FVC-%Pred-Pre: 53 %
FVC-Post: 1.66 L
FVC-Pre: 1.46 L
Post FEV1/FVC ratio: 68 %
Post FEV6/FVC ratio: 100 %
Pre FEV1/FVC ratio: 65 %
Pre FEV6/FVC Ratio: 100 %

## 2020-12-31 MED ORDER — OMEPRAZOLE 40 MG PO CPDR: 40.0000 mg | DELAYED_RELEASE_CAPSULE | Freq: Every day | ORAL | 1 refills | Status: DC

## 2020-12-31 NOTE — Progress Notes (Signed)
Spirometry pre and post and DLCO completed today. Pt was unable to do Pleth.

## 2021-01-04 ENCOUNTER — Other Ambulatory Visit: Payer: Self-pay

## 2021-01-04 ENCOUNTER — Encounter: Payer: Self-pay | Admitting: Internal Medicine

## 2021-01-04 ENCOUNTER — Ambulatory Visit (INDEPENDENT_AMBULATORY_CARE_PROVIDER_SITE_OTHER): Payer: Medicare Other | Admitting: Internal Medicine

## 2021-01-04 VITALS — BP 144/90 | HR 72 | Temp 97.8°F | Ht 63.0 in | Wt 169.8 lb

## 2021-01-04 DIAGNOSIS — F17210 Nicotine dependence, cigarettes, uncomplicated: Secondary | ICD-10-CM

## 2021-01-04 DIAGNOSIS — J449 Chronic obstructive pulmonary disease, unspecified: Secondary | ICD-10-CM

## 2021-01-04 DIAGNOSIS — F17200 Nicotine dependence, unspecified, uncomplicated: Secondary | ICD-10-CM

## 2021-01-04 DIAGNOSIS — J441 Chronic obstructive pulmonary disease with (acute) exacerbation: Secondary | ICD-10-CM | POA: Diagnosis not present

## 2021-01-04 DIAGNOSIS — J439 Emphysema, unspecified: Secondary | ICD-10-CM

## 2021-01-04 NOTE — Patient Instructions (Signed)
Please schedule follow up scheduled with myself in 3 months.  If my schedule is not open yet, we will contact you with a reminder closer to that time.  Keep taking Breztri and albuterol as needed. Good luck with surgery!  What are the benefits of quitting smoking? Quitting smoking can lower your chances of getting or dying from heart disease, lung disease, kidney failure, infection, or cancer. It can also lower your chances of getting osteoporosis, a condition that makes your bones weak. Plus, quitting smoking can help your skin look younger and reduce the chances that you will have problems with sex.  Quitting smoking will improve your health no matter how old you are, and no matter how long or how much you have smoked.  What should I do if I want to quit smoking? The letters in the word "START" can help you remember the steps to take: S = Set a quit date. T = Tell family, friends, and the people around you that you plan to quit. A = Anticipate or plan ahead for the tough times you'll face while quitting. R = Remove cigarettes and other tobacco products from your home, car, and work. T = Talk to your doctor about getting help to quit.  How can my doctor or nurse help? Your doctor or nurse can give you advice on the best way to quit. He or she can also put you in touch with counselors or other people you can call for support. Plus, your doctor or nurse can give you medicines to: ?Reduce your craving for cigarettes ?Reduce the unpleasant symptoms that happen when you stop smoking (called "withdrawal symptoms"). You can also get help from a free phone line (1-800-QUIT-NOW) or go online to [employer].  What are the symptoms of withdrawal? The symptoms include: ?Trouble sleeping ?Being irritable, anxious or restless ?Getting frustrated or angry ?Having trouble thinking clearly  Some people who stop smoking become temporarily depressed. Some people need treatment for depression, such  as counseling or antidepressant medicines. Depressed people might: ?No longer enjoy or care about doing the things they used to like to do ?Feel sad, down, hopeless, nervous, or cranky most of the day, almost every day ?Lose or gain weight ?Sleep too much or too little ?Feel tired or like they have no energy ?Feel guilty or like they are worth nothing ?Forget things or feel confused ?Move and speak more slowly than usual ?Act restless or have trouble staying still ?Think about death or suicide  If you think you might be depressed, see your doctor or nurse. Only someone trained in mental health can tell for sure if you are depressed. If you ever feel like you might hurt yourself, go straight to the nearest emergency department. Or you can call for an ambulance (in the [place] and [place], [place] 9-1-1) or call your doctor or nurse right away and tell them it is an emergency. You can also reach the [place] National Suicide Prevention Lifeline at 678-259-3750 or http://walker-sanchez.info/.  How do medicines help you stop smoking? Different medicines work in different ways: ?Nicotine replacement therapy eases withdrawal and reduces your body's craving for nicotine, the main drug found in cigarettes. There are different forms of nicotine replacement, including skin patches, lozenges, gum, nasal sprays, and "puffers" or inhalers. Many can be bought without a prescription, while others might require one. ?Bupropion is a prescription medicine that reduces your desire to smoke. This medicine is sold under the brand names Zyban and Wellbutrin. It is  also available in a generic version, which is cheaper than brand name medicines. ?Varenicline (brand names: Chantix, Champix) is a prescription medicine that reduces withdrawal symptoms and cigarette cravings. If you think you'd like to take varenicline and you have a history of depression, anxiety, or heart disease, discuss this with your doctor or nurse before  taking the medicine. Varenicline can also increase the effects of alcohol in some people. It's a good idea to limit drinking while you're taking it, at least until you know how it affects you.  How does counseling work? Counseling can happen during formal office visits or just over the phone. A counselor can help you: ?Figure out what triggers your smoking and what to do instead ?Overcome cravings ?Figure out what went wrong when you tried to quit before  What works best? Studies show that people have the best luck at quitting if they take medicines to help them quit and work with a Social worker. It might also be helpful to combine nicotine replacement with one of the prescription medicines that help people quit. In some cases, it might even make sense to take bupropion and varenicline together.  What about e-cigarettes? Sometimes people wonder if using electronic cigarettes, or "e-cigarettes," might help them quit smoking. Using e-cigarettes is also called "vaping." Doctors do not recommend e-cigarettes in place of medicines and counseling. That's because e-cigarettes still contain nicotine as well as other substances that might be harmful. It's not clear how they can affect a person's health in the long term.  Will I gain weight if I quit? Yes, you might gain a few pounds. But quitting smoking will have a much more positive effect on your health than weighing a few pounds more. Plus, you can help prevent some weight gain by being more active and eating less. Taking the medicine bupropion might help control weight gain.   What else can I do to improve my chances of quitting? You can: ?Start exercising. ?Stay away from smokers and places that you associate with smoking. If people close to you smoke, ask them to quit with you. ?Keep gum, hard candy, or something to put in your mouth handy. If you get a craving for a cigarette, try one of these instead. ?Don't give up, even if you start smoking  again. It takes most people a few tries before they succeed.  What if I am pregnant and I smoke? If you are pregnant, it's really important for the health of your baby that you quit. Ask your doctor what options you have, and what is safest for your baby

## 2021-01-04 NOTE — Progress Notes (Signed)
[name] [name]    [id number]    04/03/1946  Primary Care Physician:Perry, Zeb Comfort, MD Date of Appointment: 01/04/2021 Established Patient Visit  Chief complaint:   Chief Complaint  Patient presents with   Follow-up    SOB/ cough/ wheeze increase     HPI: [name] [name] is a 75 y.o. woman with COPD and every day tobacco use  Interval Updates:  Here for follow up Last exacerbation requiring hospitalization in Feb 2022.  Taking albuterol and breztri. No adverse effects.  Has chronic daily coughing.Using albuterol inahler less than once/day, usually in the morning.    Having hernia surgery and here for preoperative evaluation. Feeling more short of breath with exertion, but still independent with ADLs.   I have reviewed the patient's family social and past medical history and updated as appropriate.   Past Medical History:  Diagnosis Date   Cataract    Chronic left shoulder pain 09/27/2015   COPD (chronic obstructive pulmonary disease) (HCC)    Enlarged aorta (HCC)    GERD (gastroesophageal reflux disease)    Hyperlipidemia    Hypertension    Osteoarthritis    Pain of right hip joint 11/22/2015   Primary osteoarthritis of left knee 02/04/2015   S/P total knee arthroplasty 02/15/2015   SOB (shortness of breath)     Past Surgical History:  Procedure Laterality Date   ABDOMINAL HYSTERECTOMY     BLADDER SUSPENSION     BREAST BIOPSY Right    COLONOSCOPY  09/04/2011   Colonic polyps, status post polyectomy. Incidental small ascending colon lipoma   ESOPHAGOGASTRODUODENOSCOPY  09/08/2015   Schatzki ring status post esophageal dilitation. Small hiatal hernia   hemorrhoid surgery     KNEE SURGERY     left   TOTAL KNEE ARTHROPLASTY Left 02/15/2015   Procedure: TOTAL KNEE ARTHROPLASTY;  Surgeon: Vickey Huger, MD;  Location: [place];  Service: Orthopedics;  Laterality: Left;   TUBAL LIGATION     WRIST SURGERY     right    Family History  Problem Relation Age of  Onset   Stroke Mother    Heart disease Mother    COPD Father    Cancer Father        Bone   Diabetes Father    Heart disease Father    Stroke Father    Lupus Sister    Seizures Son    COPD Son    Colon cancer Neg Hx    Esophageal cancer Neg Hx    Stomach cancer Neg Hx    Rectal cancer Neg Hx     Social History   Occupational History   Occupation: Retired  Tobacco Use   Smoking status: Every Day    Packs/day: 1.50    Years: 47.00    Pack years: 70.50    Types: Cigarettes   Smokeless tobacco: Never   Tobacco comments:    Smokes 1 pack daily 01/04/21 [facility]J   Vaping Use   Vaping Use: Never used  Substance and Sexual Activity   Alcohol use: No   Drug use: No   Sexual activity: Not Currently     Physical Exam: Blood pressure (!) 144/90, pulse 72, temperature 97.8 F (36.6 C), temperature source Oral, height '5\' 3"'$  (1.6 m), weight 169 lb 12.8 oz (77 kg), SpO2 97 %.  Gen:      Chronically ill appearing Lungs:   diminished, mild end expiratory wheezes.  CV:  Regular rate and rhythm; no murmurs, rubs, or gallops.  No pedal edema   Data Reviewed: Imaging: I have personally reviewed the CT angio from Feb 2022 shows no PE. Stable 78m pulmonary nodule.   PFTs: Performed 12/31/2020 show severe airflow limitation with positive BD response and reduced diffusion capacity.   Labs: Lab Results  Component Value Date   WBC 6.2 11/18/2020   HGB 13.9 11/18/2020   HCT 42.3 11/18/2020   MCV 95 11/18/2020   PLT 197 11/18/2020   Lab Results  Component Value Date   NA 138 11/18/2020   K 4.1 11/18/2020   CL 99 11/18/2020   CO2 28 11/18/2020    Immunization status: Immunization History  Administered Date(s) Administered   Fluad Quad(high Dose 65+) 03/30/2020   Influenza, High Dose Seasonal PF 03/28/2018    Assessment:  COPD, severe Ongoing tobacco use disorder Preoperative pulmonary evaluation.    Plan/Recommendations:  Continue breztri, albuterol Low risk  for perioperative pulmonary complications based on assessment below.  She is precontemplative in quitting smoking.    ASSESSMENT AND RECOMMENDATIONS:  Preoperative Risk Calculation: The features of this patient's history that contribute to the pulmonary risk assessment include: Age, COPD, General anesthesia, throacic surgery, surgery >2 hours.  This patient has a 1.6% risk of post-operative pulmonary complications by ARISCAT Index.  The absolute assessment of risk/benefit of the procedure is deferred to the primary team's evaluation.  - Patient's Estimated risk of postoperative respiratory failure is low based on the ARISCAT Index.   0 to 25 points: Low risk: [id number] complication rate - 18 points 26 to 44 points: Intermediate risk: [id number] complication rate  45 to 123 points: High risk: [id number] complication rate  Postoperative respiratory failure (PRF) is considered as failure to wean from mechanical ventilation within 48 hours of surgery or unplanned intubation/reintubation postoperatively. The validated risk calculator provides a risk estimate of PRF and is anticipated to aid in surgical decision-making and informed patient consent.  However risk can be accepted given the potential benefit of this intervention and it is not prohibitive.  RECOMMENDATIONS:  In order to minimize the risk of complications and optimize pulmonary status, we recommend the following: - Encourage aggressive incentive spirometry hourly both peri-operatively and post-operatively as tolerated  - Early ambulation and physical therapy as tolerated post-operatively - Adequate pain control especially in the setting of abdominal and thoracic surgery - Bronchodilators as needed for wheezing or shortness of breath - Ideally we recommend smoking cessation for >4 weeks before any intervention. She is currently pre-contemplative - Intraoperatively keep OR time to the shortest as possible - Post  operatively may benefit from Nocturnal Bipap   ARISCAT: Mazo et al. Anesthesiology 2014; [phone] Smoking Cessation Counseling:  1. The patient is an everyday smoker and symptomatic due to the following condition COPD 2. The patient is currently pre-contemplative in quitting smoking. 3. I advised patient to quit smoking. 4. We identified patient specific barriers to change.  5. I personally spent 3 minutes counseling the patient regarding tobacco use disorder. 6. We discussed management of stress and anxiety to help with smoking cessation, when applicable. 7. We discussed nicotine replacement therapy, Wellbutrin, Chantix as possible options. 8. I advised setting a quit date. 9. Follow?up arranged with our office to continue ongoing discussions. 10.Resources given to patient including quit hotline.    Return to Care: Return in about 3 months (around 04/06/2021).   NLenice Llamas MD [facility]

## 2021-01-05 ENCOUNTER — Telehealth: Payer: Self-pay

## 2021-01-05 NOTE — Telephone Encounter (Signed)
error 

## 2021-01-05 NOTE — Telephone Encounter (Signed)
Error

## 2021-01-06 ENCOUNTER — Telehealth: Payer: Self-pay | Admitting: Internal Medicine

## 2021-01-06 DIAGNOSIS — J410 Simple chronic bronchitis: Secondary | ICD-10-CM

## 2021-01-06 MED ORDER — ALBUTEROL SULFATE (2.5 MG/3ML) 0.083% IN NEBU: 2.5000 mg | INHALATION_SOLUTION | Freq: Four times a day (QID) | RESPIRATORY_TRACT | 6 refills | Status: DC | PRN

## 2021-01-06 NOTE — Telephone Encounter (Signed)
Dr. Shearon Stalls has sent everything needed to the PCP and surgeon.  Nothing further needed at this time.

## 2021-01-06 NOTE — Telephone Encounter (Signed)
Refill has been sent to the pharmacy per pts request.  Nothing further is needed.  

## 2021-01-12 IMAGING — CT CT CHEST LUNG CANCER SCREENING LOW DOSE W/O CM
2 of 4 series · 15 of 36 positions shown, 18 images · non-contrast
Comparison: Standard CT chest 08/15/2013

CLINICAL DATA: 73-year-old female with 94 pack-year history of
smoking. Lung cancer screening.

EXAM:
CT CHEST WITHOUT CONTRAST LOW-DOSE FOR LUNG CANCER SCREENING
TECHNIQUE: Multidetector CT imaging of the chest was performed following the
standard protocol without IV contrast.

[Series 3: lung thins 1.0 · axial · 0.70mm/px · z∈[-320,-63]mm · 12 of 283 slices shown, 15 images]
[im 13/283  mediastinal]
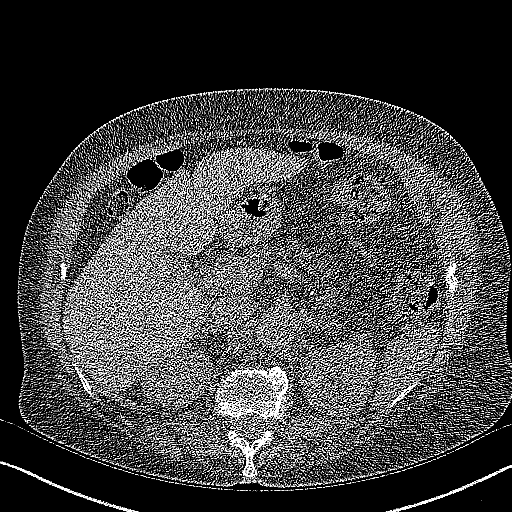
[im 13/283  lung]
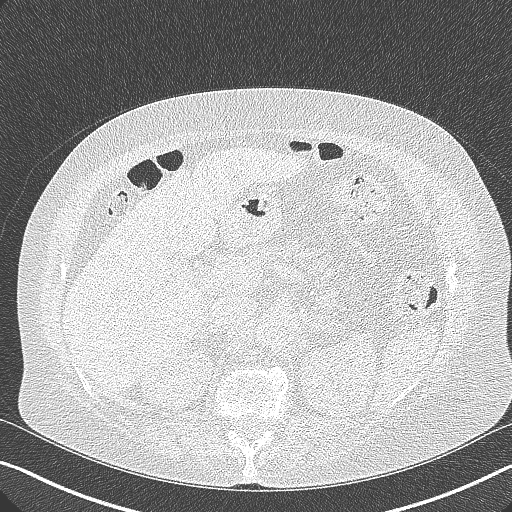
[im 39/283  lung]
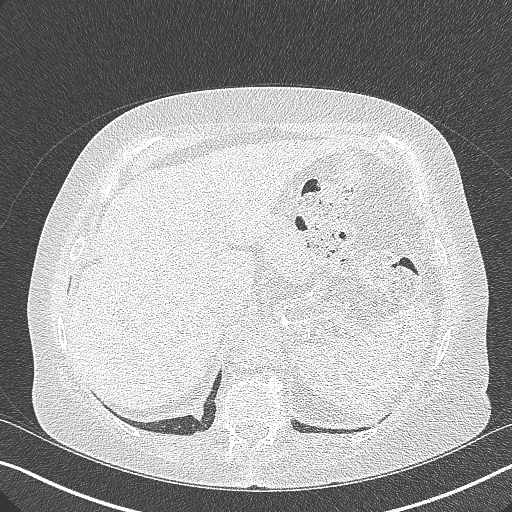
[im 65/283  lung]
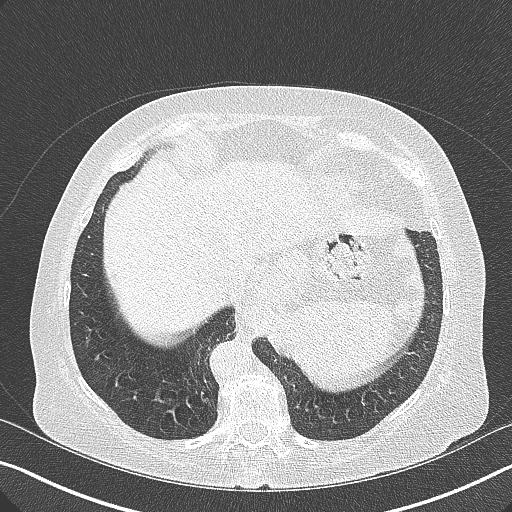
[im 90/283  lung]
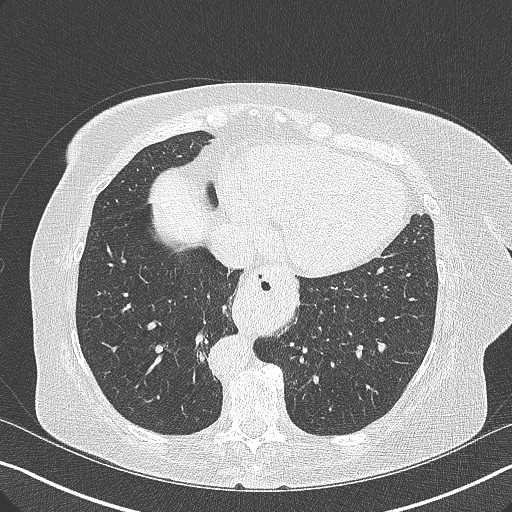
[im 103/283  mediastinal]
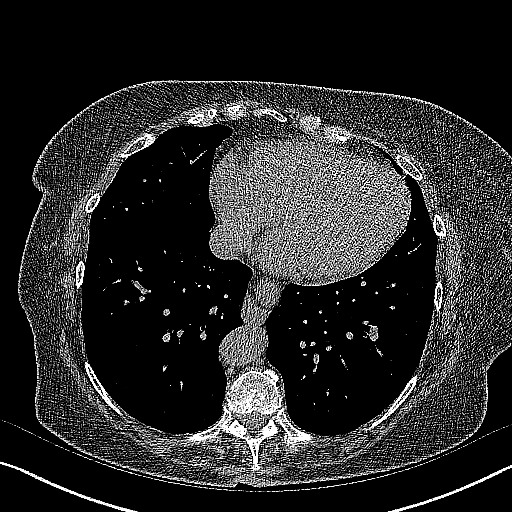
[im 103/283  lung]
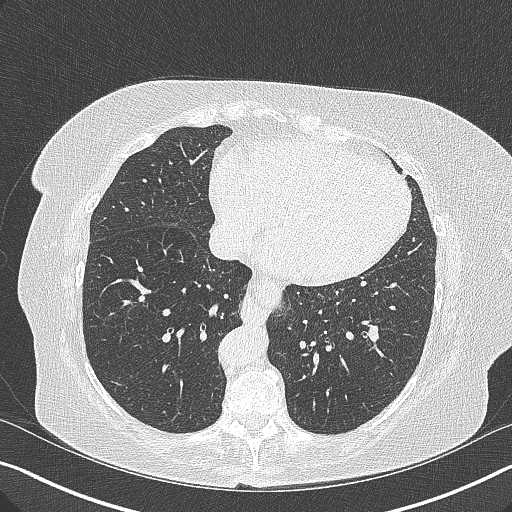
[im 129/283  lung]
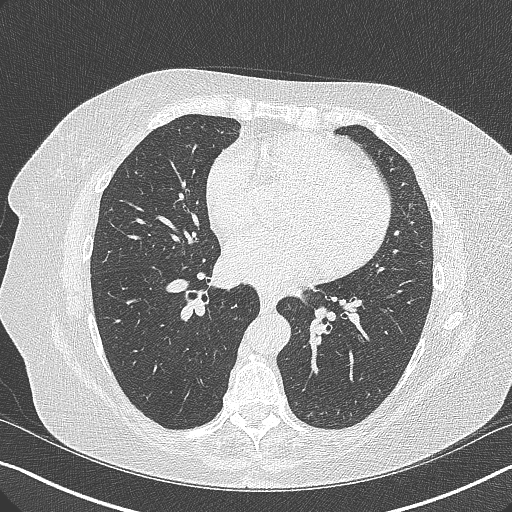
[im 154/283  lung]
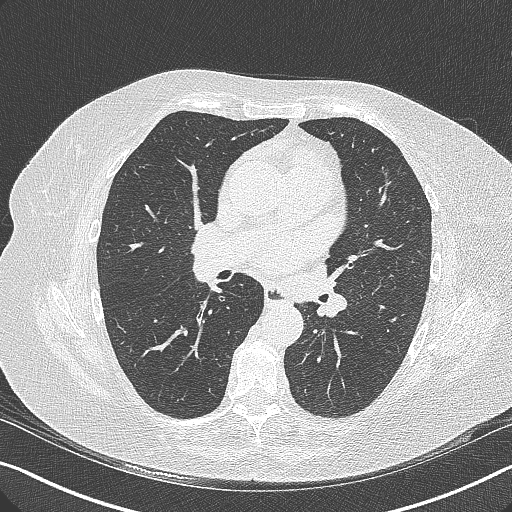
[im 180/283  lung]
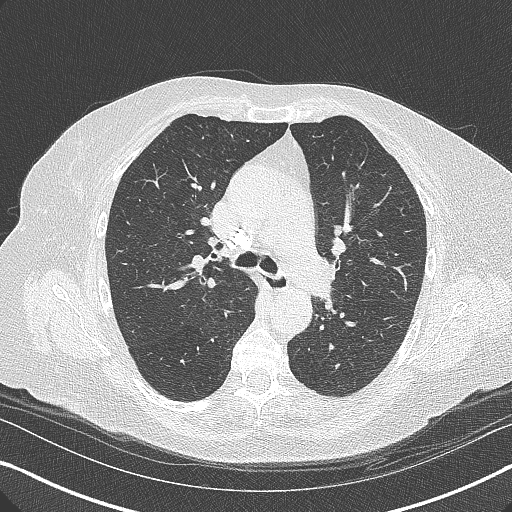
[im 193/283  mediastinal]
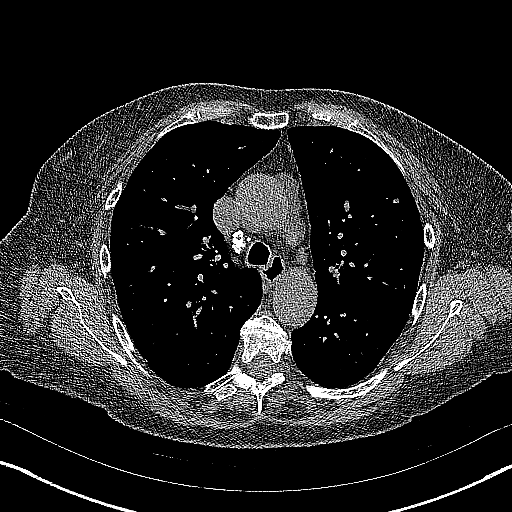
[im 193/283  lung]
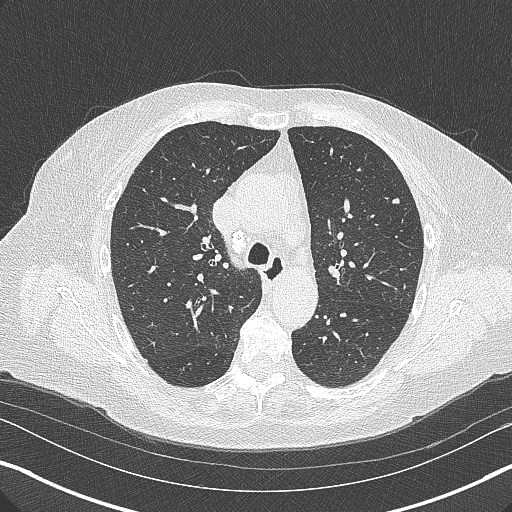
[im 218/283  lung]
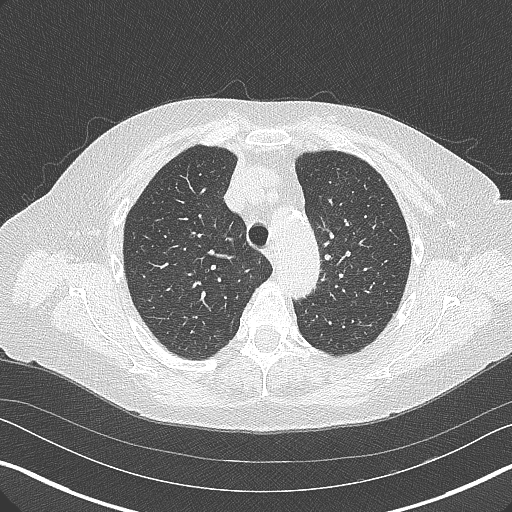
[im 244/283  lung]
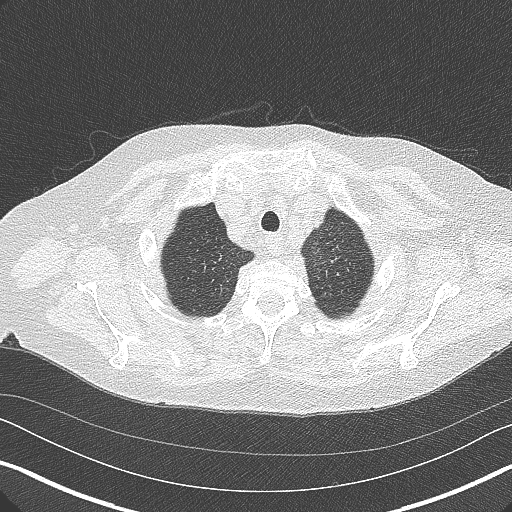
[im 270/283  lung]
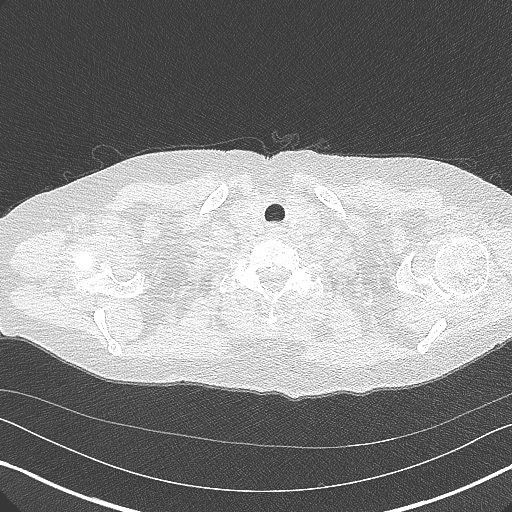

[Series 5: coronal · coronal · 0.58mm/px · 3 of 130 slices shown]
[im 26/130  lung]
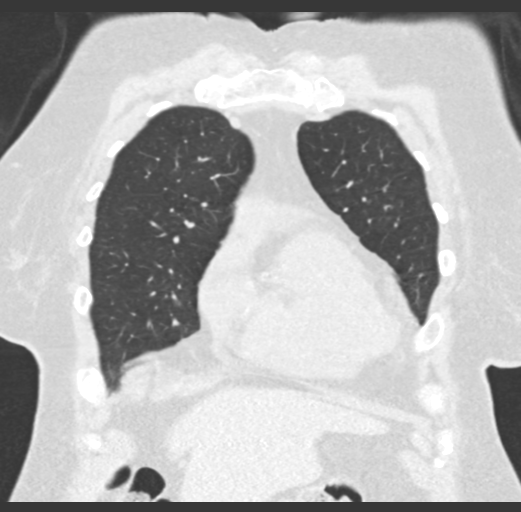
[im 52/130  lung]
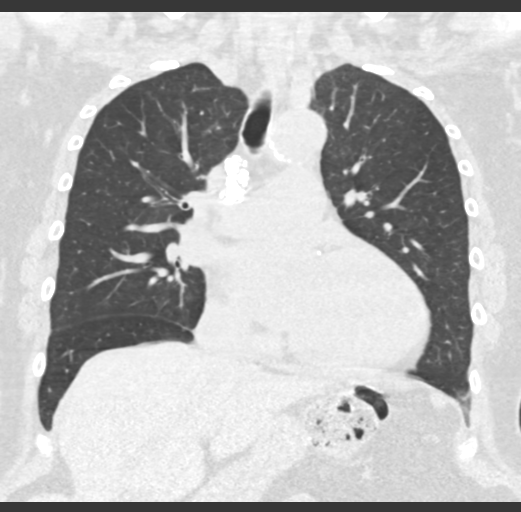
[im 78/130  lung]
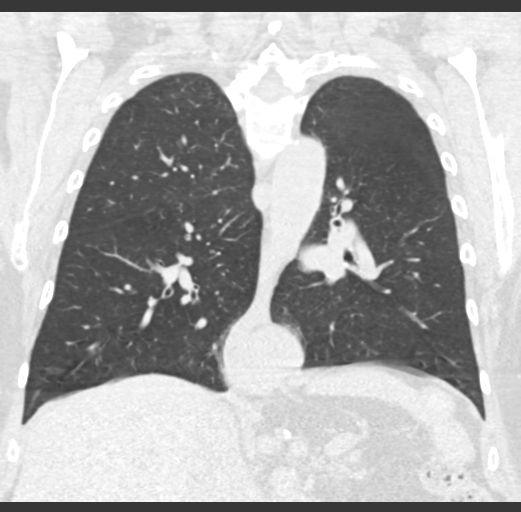

[15 of 36 positions shown; findings below may reference images not displayed]

FINDINGS: Cardiovascular: The heart size is normal. No substantial pericardial
effusion. Coronary artery calcification is evident. Atherosclerotic
calcification is noted in the wall of the thoracic aorta.
Enlargement of the pulmonary outflow tract and main pulmonary
arteries suggests pulmonary arterial hypertension.

Mediastinum/Nodes: No mediastinal lymphadenopathy. Calcified nodal
tissue is seen in the right paratracheal space and right hilum.
Small to moderate hiatal hernia noted. There is no axillary
lymphadenopathy.

Lungs/Pleura: Centrilobular emphsyema noted. Tiny bilateral
calcified and noncalcified pulmonary nodules are evident. Dominant
noncalcified nodule is a 5.9 mm left upper lobe nodule visible on
image 89 also present on the 0065 exam. No focal airspace
consolidation. No pleural effusion.

Upper Abdomen: Nonobstructing stones identified in both kidneys,
incompletely visualized.

Musculoskeletal: No worrisome lytic or sclerotic osseous
abnormality.
IMPRESSION: 1. Lung-RADS 2, benign appearance or behavior. Continue annual
screening with low-dose chest CT without contrast in 12 months.
2. Small to moderate hiatal hernia.
3. Bilateral nephrolithiasis.
4. Aortic Atherosclerosis (XQ51P-RZA.A) and Emphysema (XQ51P-SGV.L).

## 2021-01-13 ENCOUNTER — Ambulatory Visit: Payer: Medicare Other | Admitting: Legal Medicine

## 2021-01-18 ENCOUNTER — Ambulatory Visit (INDEPENDENT_AMBULATORY_CARE_PROVIDER_SITE_OTHER): Payer: Medicare Other | Admitting: Legal Medicine

## 2021-01-18 ENCOUNTER — Other Ambulatory Visit: Payer: Self-pay

## 2021-01-18 ENCOUNTER — Encounter: Payer: Self-pay | Admitting: Legal Medicine

## 2021-01-18 VITALS — BP 132/80 | HR 70 | Temp 98.0°F | Ht 63.0 in | Wt 171.6 lb

## 2021-01-18 DIAGNOSIS — E782 Mixed hyperlipidemia: Secondary | ICD-10-CM | POA: Diagnosis not present

## 2021-01-18 DIAGNOSIS — J449 Chronic obstructive pulmonary disease, unspecified: Secondary | ICD-10-CM | POA: Diagnosis not present

## 2021-01-18 DIAGNOSIS — F322 Major depressive disorder, single episode, severe without psychotic features: Secondary | ICD-10-CM | POA: Diagnosis not present

## 2021-01-18 DIAGNOSIS — K2100 Gastro-esophageal reflux disease with esophagitis, without bleeding: Secondary | ICD-10-CM | POA: Diagnosis not present

## 2021-01-18 DIAGNOSIS — I10 Essential (primary) hypertension: Secondary | ICD-10-CM

## 2021-01-18 DIAGNOSIS — E1169 Type 2 diabetes mellitus with other specified complication: Secondary | ICD-10-CM

## 2021-01-18 DIAGNOSIS — I2510 Atherosclerotic heart disease of native coronary artery without angina pectoris: Secondary | ICD-10-CM | POA: Diagnosis not present

## 2021-01-18 MED ORDER — TRAZODONE HCL 100 MG PO TABS: 100.0000 mg | ORAL_TABLET | Freq: Every day | ORAL | Status: DC

## 2021-01-18 MED ORDER — BUPROPION HCL ER (XL) 150 MG PO TB24: ORAL_TABLET | ORAL | 2 refills | Status: DC

## 2021-01-18 MED ORDER — ONETOUCH VERIO W/DEVICE KIT: 1.0000 | PACK | Freq: Every day | 0 refills | Status: DC

## 2021-01-18 NOTE — Progress Notes (Signed)
Established Patient Office Visit  Subjective:  Patient ID: [name] [name], female    DOB: Oct 29, 1945  Age: 75 y.o. MRN: [id number]  CC:  Chief Complaint  Patient presents with   Hyperlipidemia   Hypertension   Diabetes    HPI [name] [name] presents for chronic visit  Patient present with type 2 diabetes.  Specifically, this is type 2, noninsulin requiring diabetes, complicated by hypertension and hypercholesterolemia.  Compliance with treatment has been good; patient take medicines as directed, maintains diet and exercise regimen, follows up as directed, and is keeping glucose diary.  Date of  diagnosis 2010.  Depression screen has been performed.Tobacco screen nonsmoker. Current medicines for diabetes none.  Patient is on lisinopril for renal protection and rosuvastatin for cholesterol control.  Patient performs foot exams daily and last ophthalmologic exam was no.   Patient presents for follow up of hypertension.  Patient tolerating lisinopril/HCTZ well with side effects.  Patient was diagnosed with hypertension 2010 so has been treated for hypertension for 10 years.Patient is working on maintaining diet and exercise regimen and follows up as directed. Complication include cad  Patient presents with hyperlipidemia.  Compliance with treatment has been good; patient takes medicines as directed, maintains low cholesterol diet, follows up as directed, and maintains exercise regimen.  Patient is using crestor without problems. .   Past Medical History:  Diagnosis Date   Cataract    Chronic left shoulder pain 09/27/2015   COPD (chronic obstructive pulmonary disease) (HCC)    Enlarged aorta (HCC)    GERD (gastroesophageal reflux disease)    Hyperlipidemia    Hypertension    Osteoarthritis    Pain of right hip joint 11/22/2015   Primary osteoarthritis of left knee 02/04/2015   S/P total knee arthroplasty 02/15/2015   SOB (shortness of breath)     Past Surgical History:  Procedure Laterality  Date   ABDOMINAL HYSTERECTOMY     BLADDER SUSPENSION     BREAST BIOPSY Right    COLONOSCOPY  09/04/2011   Colonic polyps, status post polyectomy. Incidental small ascending colon lipoma   ESOPHAGOGASTRODUODENOSCOPY  09/08/2015   Schatzki ring status post esophageal dilitation. Small hiatal hernia   hemorrhoid surgery     KNEE SURGERY     left   TOTAL KNEE ARTHROPLASTY Left 02/15/2015   Procedure: TOTAL KNEE ARTHROPLASTY;  Surgeon: Vickey Huger, MD;  Location: [facility];  Service: Orthopedics;  Laterality: Left;   TUBAL LIGATION     WRIST SURGERY     right    Family History  Problem Relation Age of Onset   Stroke Mother    Heart disease Mother    COPD Father    Cancer Father        Bone   Diabetes Father    Heart disease Father    Stroke Father    Lupus Sister    Seizures Son    COPD Son    Colon cancer Neg Hx    Esophageal cancer Neg Hx    Stomach cancer Neg Hx    Rectal cancer Neg Hx     Social History   Socioeconomic History   Marital status: Widowed    Spouse name: Not on file   Number of children: 4   Years of education: Not on file   Highest education level: Not on file  Occupational History   Occupation: Retired  Tobacco Use   Smoking status: Every Day    Packs/day: 1.50  Years: 47.00    Pack years: 70.50    Types: Cigarettes   Smokeless tobacco: Never   Tobacco comments:    Smokes 1 pack daily 01/04/21 [facility]   Vaping Use   Vaping Use: Never used  Substance and Sexual Activity   Alcohol use: No   Drug use: No   Sexual activity: Not Currently  Other Topics Concern   Not on file  Social History Narrative   Not on file   Social Determinants of Health   Financial Resource Strain: Low Risk    Difficulty of Paying Living Expenses: Not hard at all  Food Insecurity: No Food Insecurity   Worried About Charity fundraiser in the Last Year: Never true   [place] in the Last Year: Never true  Transportation Needs: Unknown   Lack of Transportation  (Medical): No   Lack of Transportation (Non-Medical): Not on file  Physical Activity: Insufficiently Active   Days of Exercise per Week: 2 days   Minutes of Exercise per Session: 20 min  Stress: Stress Concern Present   Feeling of Stress : To some extent  Social Connections: Socially Isolated   Frequency of Communication with Friends and Family: Three times a week   Frequency of Social Gatherings with Friends and Family: Never   Attends Religious Services: Never   Marine scientist or Organizations: No   Attends Archivist Meetings: Never   Marital Status: Widowed  Human resources officer Violence: Not At Risk   Fear of Current or Ex-Partner: No   Emotionally Abused: No   Physically Abused: No   Sexually Abused: No    Outpatient Medications Prior to Visit  Medication Sig Dispense Refill   albuterol (PROVENTIL) (2.5 MG/3ML) 0.083% nebulizer solution Take 3 mLs (2.5 mg total) by nebulization every 6 (six) hours as needed for wheezing or shortness of breath. 120 mL 6   albuterol (VENTOLIN HFA) 108 (90 Base) MCG/ACT inhaler INHALE 2 PUFFS INTO THE LUNGS EVERY 6 HOURS AS NEEDED FOR WHEEZING OR SHORTNESS OF BREATH 8.5 g 6   aspirin EC 81 MG tablet Take 1 tablet (81 mg total) by mouth daily. Swallow whole. 90 tablet 3   Budeson-Glycopyrrol-Formoterol 160-9-4.8 MCG/ACT AERO Inhale 2 puffs into the lungs in the morning and at bedtime. 10.7 g 6   cloNIDine (CATAPRES) 0.1 MG tablet Take 1 tablet (0.1 mg total) by mouth 2 (two) times daily. 180 tablet 3   diazepam (VALIUM) 5 MG tablet TAKE 1 TABLET BY MOUTH EVERY 12 HOURS AS NEEDED FOR ANXIETY. WATCH FOR SEDATION 60 tablet 3   diclofenac Sodium (VOLTAREN) 1 % GEL Apply 2 g topically 4 (four) times daily. 50 g 0   isosorbide mononitrate (IMDUR) 30 MG 24 hr tablet Take 0.5 tablets (15 mg total) by mouth daily. 45 tablet 3   lisinopril-hydrochlorothiazide (ZESTORETIC) 20-12.5 MG tablet TAKE TWO TABLETS BY MOUTH EVERY MORNING 180 tablet 1    loperamide (IMODIUM) 2 MG capsule Take 2 mg by mouth as needed.     omeprazole (PRILOSEC) 40 MG capsule Take 1 capsule (40 mg total) by mouth daily. 90 capsule 1   oxyCODONE-acetaminophen (PERCOCET) 10-325 MG tablet Take 1 tablet by mouth every 6 (six) hours as needed for pain.     promethazine (PHENERGAN) 25 MG tablet Take 1 tablet (25 mg total) by mouth every 6 (six) hours as needed for nausea or vomiting. 30 tablet 3   Vitamin D, Ergocalciferol, (DRISDOL) 1.25 MG (50000 UNIT)  CAPS capsule TAKE ONE CAPSULE BY MOUTH every SEVEN DAYS 12 capsule 1   buPROPion (WELLBUTRIN XL) 150 MG 24 hr tablet TAKE 1 TABLET(150 MG) BY MOUTH DAILY 90 tablet 2   traZODone (DESYREL) 100 MG tablet Take 1 tablet (100 mg total) by mouth at bedtime.     nitroGLYCERIN (NITROSTAT) 0.4 MG SL tablet Place 1 tablet (0.4 mg total) under the tongue every 5 (five) minutes as needed for chest pain. 25 tablet 3   rosuvastatin (CRESTOR) 10 MG tablet Take 1 tablet (10 mg total) by mouth daily. 90 tablet 3   No facility-administered medications prior to visit.    Allergies  Allergen Reactions   Meperidine Anaphylaxis and Other (See Comments)    Blood pressure dropped, also   Meperidine Hcl Anaphylaxis and Other (See Comments)    B/P dropped, also   Influenza Vaccines Other (See Comments)    Flu-like symptoms   Other Rash and Other (See Comments)    asprage    ROS Review of Systems  Constitutional:  Negative for activity change and appetite change.  HENT:  Negative for congestion.   Eyes:  Negative for visual disturbance.  Respiratory:  Negative for chest tightness and shortness of breath.   Cardiovascular:  Negative for chest pain and palpitations.  Gastrointestinal:  Negative for abdominal distention and abdominal pain.  Endocrine: Negative.   Genitourinary:  Negative for difficulty urinating.  Musculoskeletal:  Negative for arthralgias and back pain.  Skin: Negative.   Neurological: Negative.    Psychiatric/Behavioral: Negative.       Objective:    Physical Exam Vitals reviewed.  Constitutional:      General: She is not in acute distress.    Appearance: Normal appearance.  HENT:     Right Ear: Tympanic membrane normal.     Left Ear: Tympanic membrane normal.     Nose: Nose normal.     Mouth/Throat:     Mouth: Mucous membranes are moist.     Pharynx: Oropharynx is clear.  Eyes:     Extraocular Movements: Extraocular movements intact.     Conjunctiva/sclera: Conjunctivae normal.     Pupils: Pupils are equal, round, and reactive to light.  Cardiovascular:     Rate and Rhythm: Normal rate and regular rhythm.     Pulses: Normal pulses.     Heart sounds: Normal heart sounds. No murmur heard.   No gallop.  Pulmonary:     Effort: Pulmonary effort is normal. No respiratory distress.     Breath sounds: Normal breath sounds. No wheezing.  Abdominal:     General: Abdomen is flat. Bowel sounds are normal. There is no distension.     Tenderness: There is no abdominal tenderness.  Musculoskeletal:        General: Normal range of motion.     Cervical back: Normal range of motion.     Right lower leg: No edema.     Left lower leg: No edema.  Skin:    General: Skin is warm.     Capillary Refill: Capillary refill takes less than 2 seconds.  Neurological:     General: No focal deficit present.     Mental Status: She is alert and oriented to person, place, and time. Mental status is at baseline.  Psychiatric:        Mood and Affect: Mood normal.        Thought Content: Thought content normal.    BP 132/80   Pulse 70  Temp 98 F (36.7 C)   Ht 5' 3"  (1.6 m)   Wt 171 lb 9.6 oz (77.8 kg)   SpO2 98%   BMI 30.40 kg/m  Wt Readings from Last 3 Encounters:  01/18/21 171 lb 9.6 oz (77.8 kg)  01/04/21 169 lb 12.8 oz (77 kg)  12/20/20 172 lb (78 kg)   Depression screen [facility] 2/9 01/18/2021 10/12/2020 09/03/2020 07/14/2020 04/15/2020  Decreased Interest 2 2 1 1 1   Down, Depressed,  Hopeless 3 2 2 2 2   PHQ - 2 Score 5 4 3 3 3   Altered sleeping 3 3 2 2 3   Tired, decreased energy 3 3 2 2 3   Change in appetite 1 0 2 2 2   Feeling bad or failure about yourself  0 0 2 2 3   Trouble concentrating 3 3 1 1 3   Moving slowly or fidgety/restless 2 2 1 1  -  Suicidal thoughts 0 0 0 0 0  PHQ-9 Score 17 15 13 13 17   Difficult doing work/chores Somewhat difficult Not difficult at all Very difficult Very difficult Extremely dIfficult     Health Maintenance Due  Topic Date Due   OPHTHALMOLOGY EXAM  Never done   Hepatitis C Screening  Never done   TETANUS/TDAP  Never done   Zoster Vaccines- Shingrix (1 of 2) Never done   COLONOSCOPY (Pts 45-72yr Insurance coverage will need to be confirmed)  Never done   PNA vac Low Risk Adult (1 of 2 - PCV13) Never done   INFLUENZA VACCINE  12/27/2020   HEMOGLOBIN A1C  01/11/2021    There are no preventive care reminders to display for this patient.  No results found for: TSH Lab Results  Component Value Date   WBC 6.2 11/18/2020   HGB 13.9 11/18/2020   HCT 42.3 11/18/2020   MCV 95 11/18/2020   PLT 197 11/18/2020   Lab Results  Component Value Date   NA 138 11/18/2020   K 4.1 11/18/2020   CO2 28 11/18/2020   GLUCOSE 85 11/18/2020   BUN 14 11/18/2020   CREATININE 0.75 11/18/2020   BILITOT 0.6 07/22/2020   ALKPHOS 64 07/22/2020   AST 16 07/22/2020   ALT 13 07/22/2020   PROT 6.2 (L) 07/22/2020   ALBUMIN 3.6 07/22/2020   CALCIUM 9.9 11/18/2020   ANIONGAP 10 07/22/2020   EGFR 83 11/18/2020   Lab Results  Component Value Date   CHOL 167 07/14/2020   Lab Results  Component Value Date   HDL 57 07/14/2020   Lab Results  Component Value Date   LDLCALC 100 (H) 07/14/2020   Lab Results  Component Value Date   TRIG 51 07/14/2020   Lab Results  Component Value Date   CHOLHDL 2.9 07/14/2020   Lab Results  Component Value Date   HGBA1C 5.1 07/14/2020      Assessment & Plan:   Problem List Items Addressed This  Visit       Cardiovascular and Mediastinum   Essential hypertension - Primary   Relevant Orders   CBC with Differential   Comprehensive metabolic panel An individual hypertension care plan was established and reinforced today.  The patient's status was assessed using clinical findings on exam and labs or diagnostic tests. The patient's success at meeting treatment goals on disease specific evidence-based guidelines and found to be well controlled. SELF MANAGEMENT: The patient and I together assessed ways to personally work towards obtaining the recommended goals. RECOMMENDATIONS: avoid decongestants found in common cold remedies, decrease  consumption of alcohol, perform routine monitoring of BP with home BP cuff, exercise, reduction of dietary salt, take medicines as prescribed, try not to miss doses and quit smoking.  Regular exercise and maintaining a healthy weight is needed.  Stress reduction may help. A CLINICAL SUMMARY including written plan identify barriers to care unique to individual due to social or financial issues.  We attempt to mutually creat solutions for individual and family understanding.     Coronary artery disease involving native coronary artery of native heart without angina pectoris An individual plan was formulated based on patient history and exam, labs and evidence based data. Patient has not had recent angina or nitroglycerin use. continue present treatment.      Respiratory   COPD (chronic obstructive pulmonary disease) ([place]) An individualize plan was formulated for care of COPD.  Treatment is evidence based.  She will continue on inhalers, avoid smoking and smoke.  Regular exercise with help with dyspnea. Routine follow ups and medication compliance is needed.      Digestive   GERD with esophagitis Plan of care was formulated today.  She is doing well.  A plan of care was formulated using patient exam, tests and other sources to optimize care using evidence based  information.  Recommend no smoking, no eating after supper, avoid fatty foods, elevate Head of bed, avoid tight fitting clothing.  Continue on OTC.      Endocrine   Type 2 diabetes mellitus with other specified complication (HCC)   Relevant Medications   Blood Glucose Monitoring Suppl (ONETOUCH VERIO) w/Device KIT An individual care plan for diabetes was established and reinforced today.  The patient's status was assessed using clinical findings on exam, labs and diagnostic testing. Patient success at meeting goals based on disease specific evidence-based guidelines and found to be good controlled. Medications were assessed and patient's understanding of the medical issues , including barriers were assessed. Recommend adherence to a diabetic diet, a graduated exercise program, HgbA1c level is checked quarterly, and urine microalbumin performed yearly .  Annual mono-filament sensation testing performed. Lower blood pressure and control hyperlipidemia is important. Get annual eye exams and annual flu shots and smoking cessation discussed.  Self management goals were discussed.      Other   Hyperlipidemia   Relevant Orders   Lipid panel AN INDIVIDUAL CARE PLAN for hyperlipidemia/ cholesterol was established and reinforced today.  The patient's status was assessed using clinical findings on exam, lab and other diagnostic tests. The patient's disease status was assessed based on evidence-based guidelines and found to be fair controlled. MEDICATIONS were reviewed. SELF MANAGEMENT GOALS have been discussed and patient's success at attaining the goal of low cholesterol was assessed. RECOMMENDATION given include regular exercise 3 days a week and low cholesterol/low fat diet. CLINICAL SUMMARY including written plan to identify barriers unique to the patient due to social or economic  reasons was discussed.     Depression, major, single episode, severe (HCC)   Relevant Medications   buPROPion (WELLBUTRIN  XL) 150 MG 24 hr tablet   traZODone (DESYREL) 100 MG tablet Patient's depression is partially controlled with buproprion and desryl.   Anhedonia fair.  PHQ 9 was performed score 17. An individual care plan was established or reinforced today.  The patient's disease status was assessed using clinical findings on exam, labs, and or other diagnostic testing to determine patient's success in meeting treatment goals based on disease specific evidence-based guidelines and found to be worsening Recommendations include restart  medicines that have run out     Meds ordered this encounter  Medications   buPROPion (WELLBUTRIN XL) 150 MG 24 hr tablet    Sig: TAKE 1 TABLET(150 MG) BY MOUTH DAILY    Dispense:  90 tablet    Refill:  2   traZODone (DESYREL) 100 MG tablet    Sig: Take 1 tablet (100 mg total) by mouth at bedtime.   Blood Glucose Monitoring Suppl (ONETOUCH VERIO) w/Device KIT    Sig: 1 each by Does not apply route daily.    Dispense:  1 kit    Refill:  0    Follow-up: Return in about 4 months (around 05/20/2021) for fasting.    [name], MD

## 2021-01-19 LAB — COMPREHENSIVE METABOLIC PANEL
ALT: 16 IU/L (ref 0–32)
AST: 17 IU/L (ref 0–40)
Albumin/Globulin Ratio: 2 (ref 1.2–2.2)
Albumin: 4.3 g/dL (ref 3.7–4.7)
Alkaline Phosphatase: 89 IU/L (ref 44–121)
BUN/Creatinine Ratio: 24 (ref 12–28)
BUN: 18 mg/dL (ref 8–27)
Bilirubin Total: 0.4 mg/dL (ref 0.0–1.2)
CO2: 25 mmol/L (ref 20–29)
Calcium: 9.5 mg/dL (ref 8.7–10.3)
Chloride: 98 mmol/L (ref 96–106)
Creatinine, Ser: 0.76 mg/dL (ref 0.57–1.00)
Globulin, Total: 2.2 g/dL (ref 1.5–4.5)
Glucose: 104 mg/dL — ABNORMAL HIGH (ref 65–99)
Potassium: 3.8 mmol/L (ref 3.5–5.2)
Sodium: 138 mmol/L (ref 134–144)
Total Protein: 6.5 g/dL (ref 6.0–8.5)
eGFR: 82 mL/min/{1.73_m2} (ref >59)

## 2021-01-19 LAB — CBC WITH DIFFERENTIAL/PLATELET
Basophils Absolute: 0.1 10*3/uL (ref 0.0–0.2)
Basos: 1 %
EOS (ABSOLUTE): 0 10*3/uL (ref 0.0–0.4)
Eos: 0 %
Hematocrit: 39.8 % (ref 34.0–46.6)
Hemoglobin: 13.6 g/dL (ref 11.1–15.9)
Immature Grans (Abs): 0 10*3/uL (ref 0.0–0.1)
Immature Granulocytes: 0 %
Lymphocytes Absolute: 1.5 10*3/uL (ref 0.7–3.1)
Lymphs: 23 %
MCH: 31.4 pg (ref 26.6–33.0)
MCHC: 34.2 g/dL (ref 31.5–35.7)
MCV: 92 fL (ref 79–97)
Monocytes Absolute: 0.5 10*3/uL (ref 0.1–0.9)
Monocytes: 8 %
Neutrophils Absolute: 4.5 10*3/uL (ref 1.4–7.0)
Neutrophils: 68 %
Platelets: 189 10*3/uL (ref 150–450)
RBC: 4.33 x10E6/uL (ref 3.77–5.28)
RDW: 12.8 % (ref 11.7–15.4)
WBC: 6.5 10*3/uL (ref 3.4–10.8)

## 2021-01-19 LAB — LIPID PANEL
Chol/HDL Ratio: 2.5 ratio (ref 0.0–4.4)
Cholesterol, Total: 152 mg/dL (ref 100–199)
HDL: 61 mg/dL (ref >39)
LDL Chol Calc (NIH): 80 mg/dL (ref 0–99)
Triglycerides: 53 mg/dL (ref 0–149)
VLDL Cholesterol Cal: 11 mg/dL (ref 5–40)

## 2021-01-19 LAB — CARDIOVASCULAR RISK ASSESSMENT

## 2021-01-19 NOTE — Progress Notes (Signed)
Cbc normal, glucose 104, kidney tests normal, liver tests normal, cholesterol normal,  lp

## 2021-01-20 ENCOUNTER — Other Ambulatory Visit: Payer: Self-pay

## 2021-01-20 DIAGNOSIS — E1169 Type 2 diabetes mellitus with other specified complication: Secondary | ICD-10-CM

## 2021-01-20 MED ORDER — GLUCOSE BLOOD VI STRP: 1.0000 | ORAL_STRIP | Freq: Every day | 2 refills | Status: DC

## 2021-01-20 MED ORDER — ONETOUCH DELICA LANCETS 33G MISC: 1.0000 | Freq: Every day | 2 refills | Status: DC

## 2021-02-02 ENCOUNTER — Ambulatory Visit (INDEPENDENT_AMBULATORY_CARE_PROVIDER_SITE_OTHER): Payer: Medicare Other | Admitting: Surgery

## 2021-02-02 ENCOUNTER — Encounter: Payer: Self-pay | Admitting: Surgery

## 2021-02-02 ENCOUNTER — Other Ambulatory Visit: Payer: Self-pay

## 2021-02-02 VITALS — BP 177/105 | Temp 98.2°F | Ht 63.0 in | Wt 170.4 lb

## 2021-02-02 DIAGNOSIS — K449 Diaphragmatic hernia without obstruction or gangrene: Secondary | ICD-10-CM

## 2021-02-02 NOTE — Patient Instructions (Addendum)
We have spoken today about repairing your Hiatal Hernia. Your surgery will be scheduled with Dr Dahlia Byes at [facility] for 02/22/21.   Plan to be in the hospital for 2-3 days if the minimally invasive surgery is completed without having to make a bigger incision. If the bigger incision is made, you will most likely need to be in the hospital 7-10 days. You will be on a liquid diet and need to recover for 2 weeks following your surgery prior to doing any of your normal activities. At the 2 week mark, we will see you in the office and if you are doing ok we will advance your diet and activity level as you tolerate.  Please see your Blue (Pre-care) Sheet for more information regarding your surgery.  Please call our office with any questions or concerns that you have regarding your surgery and recovery.     Laparoscopic Nissen Fundoplication Laparoscopic Nissen fundoplication is surgery to relieve heartburn and other problems caused by gastric fluids flowing up into your esophagus. The esophagus is the tube that carries food and liquid from your throat to your stomach. Normally, the muscle that sits between your stomach and your esophagus (lower esophageal sphincter or LES) keeps stomach fluids in your stomach. In some people, the LES does not work properly, and stomach fluids flow up into the esophagus. This can happen when part of the stomach bulges through the LES (hiatal hernia). The backward flow of stomach fluids can cause a type of severe and long-standing heartburn that is called gastroesophageal reflux disease (GERD). You may need this surgery if other treatments for GERD have not helped. In a laparoscopic Nissen fundoplication, the upper part of your stomach is wrapped around the lower part of your esophagus to strengthen the LES and prevent reflux. If you have a hiatal hernia, it will also be repaired with this surgery. The procedure is done through several small incisions in your abdomen. It is performed  using a thin, telescopic instrument (laparoscope) and other instruments that can pass through the scope or through other small incisions. Tell a health care provider about: Any allergies you have. All medicines you are taking, including vitamins, herbs, eye drops, creams, and over-the-counter medicines. Any problems you or family members have had with anesthetic medicines. Any blood disorders you have. Any surgeries you have had. Any medical conditions you have. What are the risks? Generally, this is a safe procedure. However, problems may occur, including: Difficulty swallowing (dysphagia). Bloating. Nausea or vomiting. Damage to the lung, causing a collapsed lung. Infection or bleeding. What happens before the procedure? Ask your health care provider about: Changing or stopping your regular medicines. This is especially important if you are taking diabetes medicines or blood thinners. Taking medicines such as aspirin and ibuprofen. These medicines can thin your blood. Do not take these medicines before your procedure if your health care provider asks you not to. Follow your health care provider's instructions about eating or drinking restrictions. Plan to have someone take you home after the procedure. What happens during the procedure? An IV tube will be inserted into one of your veins. It will be used to give you fluids and medicines during the procedure. You will be given a medicine that makes you fall asleep (general anesthetic). Your abdomen will be cleaned with a germ-killing solution (antiseptic). The surgeon will make a small incision in your abdomen and insert a tube through the incision. Your abdomen will be filled with a gas. This helps the  surgeon to see your organs more easily and it makes more space to work. The surgeon will insert the laparoscope through the incision. The scope has a camera that will send pictures to a monitor in the operating room. The surgeon will make  several other small incisions in your abdomen to insert the other instruments that are needed during the procedure. Another instrument (dilator) will be passed through your mouth and down your esophagus into the upper part of your stomach. The dilator will prevent your LES from being closed too tightly during surgery. The surgeon will pass the top portion of your stomach behind the lower part of your esophagus and wrap it all the way around. This will be stitched into place. If you have a hiatal hernia, it will be repaired during this procedure. All instruments will be removed, and your incisions will be closed under your skin with stitches (sutures). Skin adhesive strips may also be used. A bandage (dressing) will be placed on your skin over the incisions. The procedure may vary among health care providers and hospitals. What happens after the procedure? You will be moved to a recovery area. Your blood pressure, heart rate, breathing rate, and blood oxygen level will be monitored often until the medicines you were given have worn off. You will be given pain medicine as needed. Your IV tube will be kept in until you are able to drink fluids. This information is not intended to replace advice given to you by your health care provider. Make sure you discuss any questions you have with your health care provider. Document Released: 06/05/2014 Document Revised: 10/21/2015 Document Reviewed: 01/14/2014 Elsevier Interactive Patient Education  2017 [employer]   Laparoscopic Nissen Fundoplication, Care After Refer to this sheet in the next few weeks. These instructions provide you with information about caring for yourself after your procedure. Your health care provider may also give you more specific instructions. Your treatment has been planned according to current medical practices, but problems sometimes occur. Call your health care provider if you have any problems or questions after your  procedure. What can I expect after the procedure? After the procedure, it is common to have: Difficulty swallowing (dysphagia). Excess gas (bloating). Follow these instructions at home: Medicines  Take medicines only as directed by your health care provider. Do not drive or operate heavy machinery while taking pain medicine. Incision care  There are many different ways to close and cover an incision, including stitches (sutures), skin glue, and adhesive strips. Follow your health care provider's instructions about: Incision care. Bandage (dressing) changes and removal. Incision closure removal. Check your incision areas every day for signs of infection. Watch for: Redness, swelling, or pain. Fluid, blood, or pus. Do not take baths, swim, or use a hot tub until your health care provider approves. Take showers as directed by your health care provider. Eating and drinking  Follow your health care provider's instructions about eating. You may need to follow a liquid-only diet for 2 weeks, followed by a diet of soft foods for 2 weeks. You should return to your usual diet gradually. Drink enough fluid to keep your urine clear or pale yellow. Activity  Return to your normal activities as directed by your health care provider. Ask your health care provider what activities are safe for you. Avoid strenuous exercise. Do not lift anything that is heavier than 10 lb (4.5 kg). Ask your health care provider when you can: Return to sexual activity. Drive. Go back to  work. Sport and exercise psychologist a health care provider if: You have a fever. Your pain gets worse or is not helped by medicine. You have frequent nausea or vomiting. You have continued abdominal bloating. You have an ongoing (persistent) cough. You have redness, swelling, or pain in any incision areas. You have fluid, blood, or pus coming from any incisions. Get help right away if: You have trouble breathing. You are unable to swallow. You have  persistent vomiting. You have blood in your vomit. You have severe abdominal pain. This information is not intended to replace advice given to you by your health care provider. Make sure you discuss any questions you have with your health care provider. Document Released: 01/06/2004 Document Revised: 10/21/2015 Document Reviewed: 01/14/2014 Elsevier Interactive Patient Education  2017 [place].   Diet After Nissen Fundoplication Surgery This diet information is for patients who have recently had Nissen fundoplication surgery to correct reflux disease or to repair various types of hernias, such as hiatal hernia and intrathoracic stomach. This diet may also be used for other gastrointestinal surgeries, such as Heller myotomy and repair of achalasia. The diet will help control diarrhea, excess gas and swallowing problems, which may occur after this type of surgery. Keeping Your Stomach from Stretching Eat small, frequent meals (six to eight per day). This will help you consume the majority of the nutrients you need without causing your stomach to feel full or distended.  Drinking large amounts of fluids with meals can stretch your stomach. You may drink fluids between meals as often as you like, but limit fluids to 1/2 cup (4 fluid ounces) with meals and one cup (8 fluid ounces) with snacks.  Sit upright while eating and stay upright for 30 minutes after each meal. Gravity can help food move through your digestive tract. Do not lie down after eating. Sit upright for 2 hours after your last meal or snack of the day.  Eat very slowly. Take your time when eating.  Take small bites and chew your food well to help aid in swallowing and digestion.  Avoid crusty breads and sticky, gummy foods, such as bananas, fresh doughy breads, rolls and doughnuts. These types of foods become sticky and difficult to swallow.  Toasted breads tend to be better tolerated.  Lastly, if you eat sweets, consume them at the  end of your meal to avoid a group of symptoms referred to as "dumping syndrome". This describes the rapid emptying of foods from the stomach to the small intestine. Sweetened beverages, candy and desserts move more rapidly and dump quickly into the intestines. This can cause symptoms of nausea, weakness, cold sweats, cramps, diarrhea and dizzy spells.  Avoiding Gas Avoid drinking through a straw. Do not chew gum or tobacco. These actions cause you to swallow air, which produces excess gas in your stomach. Chew with your mouth closed.  Avoid any foods that cause stomach gas and distention. These foods include corn, dried beans, peas, lentils, onions, broccoli, cauliflower and any food from the cabbage family.  Avoid carbonated drinks, alcohol, citrus and tomato products.  When will I be able to eat a soft diet? After Nissen fundoplication surgery, your diet will be advanced slowly by your surgeon. Generally, you will be on a clear liquid diet for the first few meals. Then you will advance to the full liquid diet for a meal or two and eventually to a Nissen soft diet. Please be aware that each patient's tolerance to food is different. Your doctor will  advance your diet depending on how well you progress after surgery. Clear Liquid Diet  The first diet after surgery is the clear liquid diet. It includes the following liquids: Apple juice  Cranberry juice  Grape juice  Chicken broth  Beef broth  Flavored gelatin (Jell-O)  Decaf tea and coffee  Caffeinated beverages are permitted based on tolerance  Popsicles  [place] ice Carbonated drinks (sodas) are not allowed for the first six to eight weeks after surgery. After this time you can try them again in small amounts.  Full Liquid Diet The full liquid diet contains anything on the clear liquid diet, plus: Milk, soy, rice and almond (no chocolate)  Cream of wheat, cream of rice, grits  Strained creamed soups (no tomato or broccoli)  Vanilla and  strawberry-flavored ice cream  Sherbet  Blended, custard styled or whipped yogurt (plain or vanilla only)  Vanilla and butterscotch pudding (no chocolate or coconut)  Nutritional drinks including Ensure, Boost, Carnation Instant Breakfast (no chocolate-flavored) Note: Dairy products, such as milk, ice cream and pudding, may cause diarrhea in some people just after surgery. You may need to avoid milk products. If so, substitute them with lactose-free beverages, such as soy, rice, Lactaid or almond milks.  Nissen Soft Diet Food Category Foods to Choose Foods to Avoid  Beverages Milk, such as, whole, 2%, 1%, non-fat, or skim, soy, rice, almond  Caffeinated and decaf tea and coffee  Powdered drink mixes (in moderation)  Non-citrus juices (apple, grape, cranberry or blends of these)  Fruit nectars  Nutritional drinks including Boost, Ensure, Carnation Instant Breakfast Chocolate milk, cocoa or other chocolate-flavored drinks  Carbonated drinks  Alcohol  Citrus juices like orange, grapefruit, lemon and lime  Breads Pancakes, [place] toast and waffles  Crackers (saltine, butter, soda, graham, Goldfish and Cheese Nips)  Toasted bread Untoasted bread, bagels, [place] and hard rolls, English muffins  Crackers with nuts, seeds, fresh or dried fruit, coconut, or highly seasoned, such as garlic or onion-flavored  Sweet rolls, coffee cake or doughnuts  Cereals Well cooked cereals, such as oatmeal (plain or flavored)  Cold cereal (Cornflakes, Rice Krispies, Cheerios, Special K plain, Rice Chex and puffed rice) Very coarse cereal, such as bran, shredded wheat  Any cereal with fresh or dried fruit, coconut, seeds or nuts  Desserts Eat in moderation and do not eat desserts or sweets by themselves. Plain cakes, cookies and cream-filled pies  Vanilla and butterscotch pudding or custard  Ice cream, ice milk, frozen yogurt and sherbet  Gelatin made from allowed foods  Fruit ices and popsicles  Desserts containing chocolate, coconut, nuts, seeds, fresh or dried fruit, peppermint or spearmint  Eggs  Poached, hard boiled or scrambled Fried eggs and highly seasoned eggs (deviled eggs)  Fats Eat in moderation. Butter and margarine  Mayonnaise and vegetable oils  Mildly seasoned cream sauces and gravies  Plain cream cheese  Sour cream Highly seasoned salad dressings, cream sauces and gravies  Bacon, bacon fat, ham fat, lard and salt pork  Fried foods  Nuts  Fruits Fruit juice  Any canned or cooked fruit except those listed in the AVOID column ALL fresh fruits, such as citrus, bananas and pineapple  Canned pineapple  Dried fruits, such as raisins, berries  Fruits with seeds, such as berries, kiwi and figs  Meat, Fish, Poultry, and [employer] may be ground, minced or chopped to ease swallowing and digestion  Tender, well cooked and moist cuts of beef, chicken, [place] and pork  Veal and lamb  Flaky, cooked fish  Canned tuna  Cottage and ricotta cheeses  Mild cheese, such as American, brick, mozzarella and baby Swiss  Creamy peanut butter  Plain custard or blended fruit yogurt  Moist casseroles, such as macaroni & cheese, tuna noodle  Grilled or toasted cheese sandwich Tough meats with a lot of gristle  Fried, highly seasoned, smoked and fatty meat, fish or poultry, such as frankfurters, luncheon meats, sausage, bacon, spare ribs, beef brisket, sardines, anchovies, duck and goose  Chili and other entrees made with pepper or chili pepper  Shellfish  Strongly flavored cheeses, such as sharp cheese, extra sharp cheddar, cheese containing peppers or other seasonings  Crunchy peanut butter  Any yogurt with nuts, seeds, coconut, strawberries or raspberries  Potatoes and Starches Peeled, mashed or boiled white or sweet potatoes  Oven-baked potatoes without skin  Well cooked white rice, enriched noodles, barley, spaghetti, macaroni and other pastas Fried potatoes, potato skins  and potato chips  Hard and soft taco shells  Fried, brown or wild rice  Soups Mildly flavored meat stocks  Cream soups made from allowed foods Highly seasoned soups and tomato based soups, cream soups made with gas producing vegetables, such as broccoli, cauliflower, onion, etc.  Sweets and Snacks Use in moderation and do not eat large amounts of sweets by themselves. Syrup, honey, jelly and seedless jam  Plain hard candies and plain candies made with allowed ingredients  Molasses  Marshmallows  Other candy made from allowed ingredients  Thin pretzels Jam, marmalade and preserves  Chocolate in any form  Any candy containing nuts, coconut, seeds, peppermint, spearmint or dried or fresh fruit  Popcorn, potato chips, tortilla chips  Soft or hard thick pretzels, such as sourdough  Vegetables Well cooked soft vegetables without seeds or skins, such as asparagus tips, beets, carrots, green and wax beans, chopped spinach, tender canned baby peas, squash and pumpkin Raw vegetables, tomatoes, tomato juice, tomato sauce and V-8 juice  Gas producing vegetables, such as broccoli, Brussel sprouts, cabbage, cauliflower, onions, corn, cucumber, green peppers, rutabagas, turnips, radishes and sauerkraut  Dried beans, peas and lentils  Miscellaneous Salt and spices in moderation  Mustard and vinegar in moderation Fried or highly seasoned foods  Coconut and seeds  Pickles and olives  Chili sauces, ketchup, barbecue sauce, horseradish, black pepper, chili powder and onion and garlic seasonings  Any other strongly flavored seasoning, condiment, spice or herb not tolerated  Any food not tolerated

## 2021-02-03 ENCOUNTER — Telehealth: Payer: Self-pay | Admitting: Surgery

## 2021-02-03 NOTE — Telephone Encounter (Signed)
Patient has been advised of Pre-Admission date/time, COVID Testing date and Surgery date.  Surgery Date: 02/22/21 Preadmission Testing Date: 02/11/21 (phone 1p-5p) Covid Testing Date: 02/18/21 @ 8:20 am - patient advised to go to the [facility] ([place]) between 8a-12:00 pm  Patient has been made aware to call [phone], between 1-3:00pm the day before surgery, to find out what time to arrive for surgery.

## 2021-02-04 ENCOUNTER — Encounter: Payer: Self-pay | Admitting: Surgery

## 2021-02-04 NOTE — Progress Notes (Signed)
Outpatient Surgical Follow Up  02/04/2021  [name] [name] is an 75 y.o. female.   Chief Complaint  Patient presents with   Follow-up    Hiatal Hernia     HPI: [name] [name] is F/U for paraesophageal hernia that is symptomatic.She does have significant history of COPD, CAD with PAC and atypical chest pain.   She did have a CT that I have personally reviewed showing evidence of a moderate type III paraesophageal hernia.  Patient does report significant reflux and chronic cough.  Reflux is worsened when she lays down.  Reflux is only partial relief with PPIs.  She continues to smokes daily. She uses a walker.  She does have some dyspnea on exertion.  SHe did have a prior history of abdominal hysterectomy and tubal ligation. She did have an endoscopic evaluation a few months ago that I have personally reviewed there was evidence of a hiatal hernia with a Schatzki ring that required dilation. I have personally reviewed the barium swallow showing no obvious strictures , barium tablet passed. Recent lab work revealed normal CBC and BMP. He does use inhalers but no oxygen yet.  Past Medical History:  Diagnosis Date   Cataract    Chronic left shoulder pain 09/27/2015   COPD (chronic obstructive pulmonary disease) (HCC)    Enlarged aorta (HCC)    GERD (gastroesophageal reflux disease)    Hyperlipidemia    Hypertension    Osteoarthritis    Pain of right hip joint 11/22/2015   Primary osteoarthritis of left knee 02/04/2015   S/P total knee arthroplasty 02/15/2015   SOB (shortness of breath)     Past Surgical History:  Procedure Laterality Date   ABDOMINAL HYSTERECTOMY     BLADDER SUSPENSION     BREAST BIOPSY Right    COLONOSCOPY  09/04/2011   Colonic polyps, status post polyectomy. Incidental small ascending colon lipoma   ESOPHAGOGASTRODUODENOSCOPY  09/08/2015   Schatzki ring status post esophageal dilitation. Small hiatal hernia   hemorrhoid surgery     KNEE SURGERY     left   TOTAL KNEE  ARTHROPLASTY Left 02/15/2015   Procedure: TOTAL KNEE ARTHROPLASTY;  Surgeon: Vickey Huger, MD;  Location: [facility];  Service: Orthopedics;  Laterality: Left;   TUBAL LIGATION     WRIST SURGERY     right    Family History  Problem Relation Age of Onset   Stroke Mother    Heart disease Mother    COPD Father    Cancer Father        Bone   Diabetes Father    Heart disease Father    Stroke Father    Lupus Sister    Seizures Son    COPD Son    Colon cancer Neg Hx    Esophageal cancer Neg Hx    Stomach cancer Neg Hx    Rectal cancer Neg Hx     Social History:  reports that she has been smoking cigarettes. She has a 47.00 pack-year smoking history. She has never used smokeless tobacco. She reports that she does not drink alcohol and does not use drugs.  Allergies:  Allergies  Allergen Reactions   Meperidine Anaphylaxis and Other (See Comments)    Blood pressure dropped, also   Meperidine Hcl Anaphylaxis and Other (See Comments)    B/P dropped, also   Influenza Vaccines Other (See Comments)    Flu-like symptoms   Other Rash and Other (See Comments)    asprage    Medications reviewed.  ROS Full ROS performed and is otherwise negative other than what is stated in HPI   BP (!) 177/105   Temp 98.2 F (36.8 C)   Ht '5\' 3"'$  (1.6 m)   Wt 170 lb 6.4 oz (77.3 kg)   SpO2 95%   BMI 30.19 kg/m   Physical Exam   CONSTITUTIONAL: NAD, appears chronically ill EYES: Pupils are equal, round,  Sclera are non-icteric. EARS, NOSE, MOUTH AND THROAT: She is wearing a mask hearing is intact to voice. LYMPH NODES:  Lymph nodes in the neck are normal. RESPIRATORY:  Lungs are clear. There is normal respiratory effort, with equal breath sounds bilaterally, bilateral wheezing  CARDIOVASCULAR: Heart is regular without murmurs, gallops, or rubs. GI: The abdomen is soft, mild tenderness palpation epigastric area , and nondistended. There are no palpable masses. There is no hepatosplenomegaly.  There are normal bowel sounds in all quadrants. GU: Rectal deferred.   MUSCULOSKELETAL: Normal muscle strength and tone. No cyanosis or edema.   SKIN: Turgor is good and there are no pathologic skin lesions or ulcers. NEUROLOGIC: Motor and sensation is grossly normal. Cranial nerves are grossly intact. PSYCH:  Oriented to person, place and time. Affect is normal.   Assessment/Plan: [name] [name] is a 75 year old female with symptomatic paraesophageal hernia with prior stricture requiring dilation.  I do think that she will benefit from paraesophageal hernia repair and fundoplication.  She has been cleared by cardiology and pulmonary medicine.  Procedure discussed with the patient in detail.  Risks, benefits and possible complications including but not limited to: Bleeding, infection, prolonged hospitalization, conversion to open, esophageal injuries, bloating and chronic pain as well as dysphagia.  She understands and wishes to proceed   Greater than 50% of the 45 minutes  visit was spent in counseling/coordination of care   Caroleen Hamman, MD [facility]

## 2021-02-04 NOTE — H&P (View-Only) (Signed)
Outpatient Surgical Follow Up  02/04/2021  [name] [name] is an 75 y.o. female.   Chief Complaint  Patient presents with   Follow-up    Hiatal Hernia     HPI: [name] [name] is F/U for paraesophageal hernia that is symptomatic.She does have significant history of COPD, CAD with PAC and atypical chest pain.   She did have a CT that I have personally reviewed showing evidence of a moderate type III paraesophageal hernia.  Patient does report significant reflux and chronic cough.  Reflux is worsened when she lays down.  Reflux is only partial relief with PPIs.  She continues to smokes daily. She uses a walker.  She does have some dyspnea on exertion.  SHe did have a prior history of abdominal hysterectomy and tubal ligation. She did have an endoscopic evaluation a few months ago that I have personally reviewed there was evidence of a hiatal hernia with a Schatzki ring that required dilation. I have personally reviewed the barium swallow showing no obvious strictures , barium tablet passed. Recent lab work revealed normal CBC and BMP. He does use inhalers but no oxygen yet.  Past Medical History:  Diagnosis Date   Cataract    Chronic left shoulder pain 09/27/2015   COPD (chronic obstructive pulmonary disease) (HCC)    Enlarged aorta (HCC)    GERD (gastroesophageal reflux disease)    Hyperlipidemia    Hypertension    Osteoarthritis    Pain of right hip joint 11/22/2015   Primary osteoarthritis of left knee 02/04/2015   S/P total knee arthroplasty 02/15/2015   SOB (shortness of breath)     Past Surgical History:  Procedure Laterality Date   ABDOMINAL HYSTERECTOMY     BLADDER SUSPENSION     BREAST BIOPSY Right    COLONOSCOPY  09/04/2011   Colonic polyps, status post polyectomy. Incidental small ascending colon lipoma   ESOPHAGOGASTRODUODENOSCOPY  09/08/2015   Schatzki ring status post esophageal dilitation. Small hiatal hernia   hemorrhoid surgery     KNEE SURGERY     left   TOTAL KNEE  ARTHROPLASTY Left 02/15/2015   Procedure: TOTAL KNEE ARTHROPLASTY;  Surgeon: Vickey Huger, MD;  Location: [facility];  Service: Orthopedics;  Laterality: Left;   TUBAL LIGATION     WRIST SURGERY     right    Family History  Problem Relation Age of Onset   Stroke Mother    Heart disease Mother    COPD Father    Cancer Father        Bone   Diabetes Father    Heart disease Father    Stroke Father    Lupus Sister    Seizures Son    COPD Son    Colon cancer Neg Hx    Esophageal cancer Neg Hx    Stomach cancer Neg Hx    Rectal cancer Neg Hx     Social History:  reports that she has been smoking cigarettes. She has a 47.00 pack-year smoking history. She has never used smokeless tobacco. She reports that she does not drink alcohol and does not use drugs.  Allergies:  Allergies  Allergen Reactions   Meperidine Anaphylaxis and Other (See Comments)    Blood pressure dropped, also   Meperidine Hcl Anaphylaxis and Other (See Comments)    B/P dropped, also   Influenza Vaccines Other (See Comments)    Flu-like symptoms   Other Rash and Other (See Comments)    asprage    Medications reviewed.  ROS Full ROS performed and is otherwise negative other than what is stated in HPI   BP (!) 177/105   Temp 98.2 F (36.8 C)   Ht '5\' 3"'$  (1.6 m)   Wt 170 lb 6.4 oz (77.3 kg)   SpO2 95%   BMI 30.19 kg/m   Physical Exam   CONSTITUTIONAL: NAD, appears chronically ill EYES: Pupils are equal, round,  Sclera are non-icteric. EARS, NOSE, MOUTH AND THROAT: She is wearing a mask hearing is intact to voice. LYMPH NODES:  Lymph nodes in the neck are normal. RESPIRATORY:  Lungs are clear. There is normal respiratory effort, with equal breath sounds bilaterally, bilateral wheezing  CARDIOVASCULAR: Heart is regular without murmurs, gallops, or rubs. GI: The abdomen is soft, mild tenderness palpation epigastric area , and nondistended. There are no palpable masses. There is no hepatosplenomegaly.  There are normal bowel sounds in all quadrants. GU: Rectal deferred.   MUSCULOSKELETAL: Normal muscle strength and tone. No cyanosis or edema.   SKIN: Turgor is good and there are no pathologic skin lesions or ulcers. NEUROLOGIC: Motor and sensation is grossly normal. Cranial nerves are grossly intact. PSYCH:  Oriented to person, place and time. Affect is normal.   Assessment/Plan: [name] [name] is a 74 year old female with symptomatic paraesophageal hernia with prior stricture requiring dilation.  I do think that she will benefit from paraesophageal hernia repair and fundoplication.  She has been cleared by cardiology and pulmonary medicine.  Procedure discussed with the patient in detail.  Risks, benefits and possible complications including but not limited to: Bleeding, infection, prolonged hospitalization, conversion to open, esophageal injuries, bloating and chronic pain as well as dysphagia.  She understands and wishes to proceed   Greater than 50% of the 45 minutes  visit was spent in counseling/coordination of care   Caroleen Hamman, MD [facility]

## 2021-02-11 ENCOUNTER — Encounter
Admission: RE | Admit: 2021-02-11 | Discharge: 2021-02-11 | Disposition: A | Discharge status: Home / Self Care | Payer: Medicare Other | Source: Ambulatory Visit | Attending: Surgery | Admitting: Surgery

## 2021-02-11 ENCOUNTER — Other Ambulatory Visit: Payer: Self-pay

## 2021-02-11 HISTORY — DX: Anxiety disorder, unspecified: F41.9

## 2021-02-11 HISTORY — DX: Dyspnea, unspecified: R06.00

## 2021-02-11 HISTORY — DX: Prediabetes: R73.03

## 2021-02-11 NOTE — Patient Instructions (Signed)
Your procedure is scheduled on: 02/22/21 Report to [place]. To find out your arrival time please call [phone] between 1PM - 3PM on 02/21/21.  Remember: Instructions that are not followed completely may result in serious medical risk, up to and including death, or upon the discretion of your surgeon and anesthesiologist your surgery may need to be rescheduled.     _X__ 1. Do not eat food after midnight the night before your procedure.                 No gum chewing or hard candies. Y  __X__2.  On the morning of surgery brush your teeth with toothpaste and water, you                 may rinse your mouth with mouthwash if you wish.  Do not swallow any              toothpaste of mouthwash.     _X__ 3.  No Alcohol for 24 hours before or after surgery.   _X__ 4.  Do Not Smoke or use e-cigarettes For 24 Hours Prior to Your Surgery.                 Do not use any chewable tobacco products for at least 6 hours prior to                 surgery.  ____  5.  Bring all medications with you on the day of surgery if instructed.   __X__  6.  Notify your doctor if there is any change in your medical condition      (cold, fever, infections).     Do not wear jewelry, make-up, hairpins, clips or nail polish. Do not wear lotions, powders, or perfumes.  Do not shave 48 hours prior to surgery. Men may shave face and neck. Do not bring valuables to the hospital.    [facility] is not responsible for any belongings or valuables.  Contacts, dentures/partials or body piercings may not be worn into surgery. Bring a case for your contacts, glasses or hearing aids, a denture cup will be supplied. Leave your suitcase in the car. After surgery it may be brought to your room. For patients admitted to the hospital, discharge time is determined by your treatment team.   Patients discharged the day of surgery will not be allowed to drive home.   Please  read over the following fact sheets that you were given:   MRSA Information  __X__ Take these medicines the morning of surgery with A SIP OF WATER:    1. buPROPion (WELLBUTRIN XL) 150 MG 24 hr tablet  2. omeprazole (PRILOSEC) 40 MG capsule  3. rosuvastatin (CRESTOR) 10 MG tablet  4. oxyCODONE-acetaminophen (PERCOCET) 10-325 MG tablet  5. isosorbide mononitrate (IMDUR) if you still take this medication, have contacted your PCP  6. cloNIDine (CATAPRES) 0.1 MG tablet if you still take this medication, have contacted your PCP  ____ Fleet Enema (as directed)   __X__ Use CHG Soap/SAGE wipes as directed  ____ Use inhalers on the day of surgery  ____ Stop metformin/Janumet/Farxiga 2 days prior to surgery    ____ Take 1/2 of usual insulin dose the night before surgery. No insulin the morning          of surgery.   ____ Stop Blood Thinners Coumadin/Plavix/Xarelto/Pleta/Pradaxa/Eliquis/Effient/Aspirin  on   Or contact your Surgeon, Cardiologist or Medical  Doctor regarding  ability to stop your blood thinners  __X__ Stop Anti-inflammatories 7 days before surgery such as Advil, Ibuprofen, Motrin,  BC or Goodies Powder, Naprosyn, Naproxen, Aleve, Aspirin    __X__ Stop all herbal supplements, fish oil or vitamin E until after surgery.    ____ Bring C-Pap to the hospital.    HOLD YOUR ASPIRIN FOR 1 WEEK BEFORE SURGERY

## 2021-02-14 ENCOUNTER — Encounter: Payer: Self-pay | Admitting: Surgery

## 2021-02-14 NOTE — Consult Note (Signed)
Perioperative Services  Pre-Admission/Anesthesia Testing Clinical Consult  Date: 02/18/21  Patient Demographics:  Name: [name] [name] DOB:   Jan 13, 1946 MRN:   [id number]  Planned Surgical Procedure(s):   Case: [id number] Date/Time: 02/22/21 1140  Procedure: XI ROBOTIC ASSISTED PARAESOPHAGEAL HERNIA REPAIR with RNFA to assist  Anesthesia type: General  Pre-op diagnosis: hiatal/paraesophageal hernia  Location: [facility] [place] [facility]  Surgeons: Jules Husbands, MD   NOTE: Available PAT nursing documentation and vital signs have been reviewed. Clinical nursing staff has updated patient's PMH/PSHx, current medication list, and drug allergies/intolerances to ensure comprehensive history available to assist in medical decision making as it pertains to the aforementioned surgical procedure and anticipated anesthetic course. Extensive review of available clinical information performed. [place] PMH and PSHx updated with any diagnoses/procedures that  may have been inadvertently omitted during her intake with the pre-admission testing department's nursing staff.  Clinical Discussion:  [name] [name] is a 75 y.o. female who is submitted for pre-surgical anesthesia review and clearance prior to her undergoing the above procedure. Patient is a Current Smoker (47 pack years). Pertinent PMH includes: CAD, MI, angina, PSVT, diastolic dysfunction, PACs, aortic atherosclerosis, ascending aortic ectasia, HTN, HLD, T2DM, SOB, asthma, COPD, GERD (on daily PPI), paraesophageal hernia, presbyesophagus, OA, SLE, depression, anxiety (on BZO).  Patient with a history of significant acid reflux and associated cough.  Patient underwent diagnostic swallow study on 12/24/2020 that revealed tertiary contractions of the esophagus, mild gastroesophageal reflux, and a moderate sized hiatal hernia. She subsequently underwent recent endoscopic evaluation that revealed presbyesophagus, a 2 cm hiatal hernia,  atrophic gastritis, and Schatzki ring.  Of note, patient has undergone esophageal dilatation in the past with intermittent improvement of her symptoms.  Patient was referred to general surgery Dahlia Byes, MD) for further evaluation and consideration of surgical intervention.  Patient was seen in consult on 02/02/2021 by general surgery; notes reviewed.  Recent imaging and diagnostic procedures reviewed.  Alternatives to surgery discussed.  Risk versus benefits associated with planned surgical intervention reviewed at length with patient.  Following consultation with general surgery, patient has elected to proceed with paraesophageal hernia repair and fundoplication.  Given patient's past medical history significant for both cardiovascular and cardiopulmonary diagnoses, general surgery has requested anesthesia review/consultation prior to proceeding with planned elective surgical intervention.  Patient is followed by cardiology Harriet Masson, MD). She was last seen in the cardiology clinic on 11/18/2020; notes reviewed.  At the time of her clinic visit, patient complained of intermittent episodes of chest discomfort and abdominal pain.  She has chronic shortness of breath related to her underlying COPD and ongoing smoking history.  Patient denied any PND, orthopnea, palpitations, significant peripheral edema, vertiginous symptoms, or presyncope/syncope.  PMH significant for cardiovascular diagnoses.  Patient reporting a remote MI while living in [place].  Records are unavailable for review at the time of clinical consult.  TTE performed on 09/02/2019 revealed a normal left ventricular systolic function with an EF of 60-65%.  Diastolic parameters consistent with pseudonormalization (G2DD).  PASP mildly elevated.  There was left atrial dilatation.  There was no evidence of significant valvular regurgitation or stenosis.  Ascending aorta noted to be mildly dilated at 39 mm.  Myocardial perfusion imaging study performed on  09/04/2019 revealed normal left ventricular systolic function with a hyperdynamic LVEF of 68%.  There was a medium defect of moderate severity present in the basal inferolateral and apical lateral locations.  There was no evidence of ischemia  or stress-induced arrhythmia.  Study determined to be low risk.  Long-term cardiac event monitor study performed on 12/04/2019 revealed a predominant underlying sinus rhythm with a minimum heart rate of 47 bpm, maximum heart rate of 176 bpm, and average heart rate of 72 bpm.  There were 22 SVT runs noted with the fastest lasting 7 beats at a maximum rate of 176 bpm, and the longest lasting 18 beats at an average heart rate of 122 bpm.  There were occasional PACs with a 1.1% burden.  PVCs were rare; bigeminy present.  There were no significant arrhythmias or pauses noted.  Coronary CTA was performed on 08/24/2020 revealing mild nonobstructive multivessel CAD in the RCA, LAD, and LCx.  Degree of stenosis estimated to be 25-49%.  Coronary calcium score was 509, which was in the 87th percentile for age and sex matched control.  Repeat TTE performed on 07/23/2020 revealed a hyperdynamic left ventricular ejection fraction of 36-64% diastolic parameters consistent with impaired relaxation (G1DD).  Right ventricular systolic function normal.  There is no evidence of significant valvular stenosis or regurgitation.  Blood pressure recently controlled at 140/86 on currently prescribed ACEi, diuretic, and alpha-blocker therapies.  Patient is on a statin for her HLD.  T2DM well-controlled on currently prescribed regimen; last Hgb A1c was 5.1% when checked on 07/14/2020. Functional capacity, as defined by DASI, is documented as being >/= 4 METS.  Patient was started on daily nitrate therapy.  No other changes were made to her medication regimen.  Patient to follow-up with outpatient cardiology in 6 months or sooner if needed.  Patient is also followed by pulmonary medicine Shearon Stalls,  MD).  She was last seen in the pulmonology clinic on 01/04/2021; notes reviewed.  At the time of her clinic visit, patient reporting increased exertional dyspnea, however remained independent with all ADL/IADLs. She continued on prescribed ICS + ICA + LABA Judithann Sauger) MDI as prescribed. She reported use of her SABA (albuterol) MDI had decreased to about once daily.  No recent AECOPD events since last hospitalization in 06/2020.  Patient does not require the use of daily supplemental oxygen therapy.  Last PFTs performed on 12/31/2020 revealed an FEV1 of 0.95 (53% predicted) with significant bronchodilator response.  Testing consistent with severe airflow limitation and normal diffusion capacity.  Patient reported increased coughing.  No changes were made to patient's medication regimen.  Patient to follow-up with outpatient pulmonary medicine and 3 months or sooner if needed.  [name] [name] is scheduled for an ROBOT ASSISTED PARAESOPHAGEAL HERNIA REPAIR on 02/22/2021 with Dr. Caroleen Hamman, MD.  Given patient's past medical history significant for cardiovascular and cardiopulmonary diagnoses, presurgical clearances were sought from both cardiology and pulmonary medicine.  Specialty clearances were obtained as follows.  Per pulmonary medicine Shearon Stalls, MD), "based on the ARISCAT index, patient is considered to be at an overall LOW risk (1.6%) for significant pulmonary complications during the planned procedure.  However, risk can be excepted given the potential benefit of this intervention and it is not prohibitive".  Pulmonary provider recommending aggressive incentive spirometry hourly both peri-operatively and post-operatively as tolerated, early ambulation and physical therapy as tolerated post-operatively, adequate pain control especially in the setting of abdominal and thoracic surgery, bronchodilators as needed for wheezing or shortness of breath, shortest OR time possible, and postoperative NIPPV as needed.  MD  also notes that smoking cessation > 4 weeks prior to surgery is ideal, however patient remains pre-contemplative.   Per cardiology Marilynn Rail, NP-C for [place], MD), "based  on patient's past medical history and time since his last clinic visit, patient would be at an overall ACCEPTABLE risk for the planned procedure without further cardiovascular testing or intervention at this time".  This patient is on daily antiplatelet therapy.  She has been instructed on recommendations from both cardiology and her general surgeon for holding her ASA for 5 days prior to her procedure with plans to restart as soon as postoperative bleeding risk felt to be minimized by her primary attending surgeon.  The patient is aware that her last dose of daily low-dose ASA will be on 02/16/2021.  Patient denies previous perioperative complications with anesthesia in the past. In review of the available records, it is noted that patient underwent a neuraxial + MAC anesthetic course at [facility] ([facility]) in 01/2015 without documented complications.   Vitals with BMI 02/11/2021 02/02/2021 01/18/2021  Height _0  _1  _2   Weight 168 lbs 170 lbs 6 oz 171 lbs 10 oz  BMI 29.77 50.09 38.18  Systolic - 299 371  Diastolic - 696 80  Pulse - - 70    Providers/Specialists:   NOTE: Primary physician provider listed below. Patient may have been seen by APP or partner within same practice.   PROVIDER ROLE / SPECIALTY LAST Suszanne Finch, MD General Surgery 02/02/2021  Lillard Anes, MD Primary Care Provider 01/18/2021  Berniece Salines, MD Cardiology 11/18/2020  Lenice Llamas, MD Pulmonary Medicine 01/04/2021   Allergies:  Meperidine, Influenza vaccines, and Other  Current Home Medications:   No current facility-administered medications for this encounter.    albuterol (PROVENTIL) (2.5 MG/3ML) 0.083% nebulizer solution   albuterol (VENTOLIN HFA) 108 (90 Base) MCG/ACT inhaler   aspirin EC 81 MG tablet    Budeson-Glycopyrrol-Formoterol 160-9-4.8 MCG/ACT AERO   buPROPion (WELLBUTRIN XL) 150 MG 24 hr tablet   diazepam (VALIUM) 5 MG tablet   diclofenac Sodium (VOLTAREN) 1 % GEL   lisinopril-hydrochlorothiazide (ZESTORETIC) 20-12.5 MG tablet   nitroGLYCERIN (NITROSTAT) 0.4 MG SL tablet   omeprazole (PRILOSEC) 40 MG capsule   oxyCODONE-acetaminophen (PERCOCET) 10-325 MG tablet   promethazine (PHENERGAN) 25 MG tablet   rosuvastatin (CRESTOR) 10 MG tablet   traZODone (DESYREL) 100 MG tablet   Blood Glucose Monitoring Suppl (ONETOUCH VERIO) w/Device KIT   cloNIDine (CATAPRES) 0.1 MG tablet   glucose blood test strip   isosorbide mononitrate (IMDUR) 30 MG 24 hr tablet   OneTouch Delica Lancets 78L MISC   Vitamin D, Ergocalciferol, (DRISDOL) 1.25 MG (50000 UNIT) CAPS capsule   History:   Past Medical History:  Diagnosis Date   Anxiety    Aortic atherosclerosis (HCC)    Ascending aorta dilation (HCC)    a.) TTE 09/02/19 --> mild; measured 14m.   Asthma    CAD (coronary artery disease)    a,) CTA 08/24/20 --> mild CAD (25-49%) in RCA, LAD, LCx; CAC/Agatston score 509.   Cataract    Chronic left shoulder pain 09/27/2015   COPD (chronic obstructive pulmonary disease) (HCC)    Depression    Diastolic dysfunction    a.) TTE 09/02/19 --> LVEF 60-65%; G2DD. b.) TTE 07/23/20 --> LVEF 70-75%; G1DD.   GERD (gastroesophageal reflux disease)    History of kidney stones    Hyperlipidemia    Hypertension    Lupus (HCC)    [place]ocardial infarction ([place]    Osteoarthritis    PAC (premature atrial contraction)    a.) Holter 12/04/19 --> occassional; 1.1% PAC burden.  Pain of right hip joint 11/22/2015   Paraesophageal hernia    Presbyesophagus    Primary osteoarthritis of left knee 02/04/2015   PSVT (paroxysmal supraventricular tachycardia) ([place])    a.) Holter 12/04/19 --> 22 runs.   Schatzki's ring    SOB (shortness of breath)    T2DM (type 2 diabetes mellitus) ([place])    Past Surgical  History:  Procedure Laterality Date   ABDOMINAL HYSTERECTOMY     BLADDER SUSPENSION     BREAST BIOPSY Right    COLONOSCOPY  09/04/2011   Colonic polyps, status post polyectomy. Incidental small ascending colon lipoma   ESOPHAGOGASTRODUODENOSCOPY  09/08/2015   Schatzki ring status post esophageal dilitation. Small hiatal hernia   hemorrhoid surgery     KNEE SURGERY     left   TOTAL KNEE ARTHROPLASTY Left 02/15/2015   Procedure: TOTAL KNEE ARTHROPLASTY;  Surgeon: Vickey Huger, MD;  Location: [facility];  Service: Orthopedics;  Laterality: Left;   TUBAL LIGATION     WRIST SURGERY     right   Family History  Problem Relation Age of Onset   Stroke Mother    Heart disease Mother    COPD Father    Cancer Father        Bone   Diabetes Father    Heart disease Father    Stroke Father    Lupus Sister    Seizures Son    COPD Son    Colon cancer Neg Hx    Esophageal cancer Neg Hx    Stomach cancer Neg Hx    Rectal cancer Neg Hx    Social History   Tobacco Use   Smoking status: Every Day    Packs/day: 1.00    Years: 47.00    Pack years: 47.00    Types: Cigarettes   Smokeless tobacco: Never   Tobacco comments:    Smokes 1 pack daily 01/04/21 ARJ   Vaping Use   Vaping Use: Never used  Substance Use Topics   Alcohol use: No   Drug use: No    Pertinent Clinical Results:  LABS: Labs reviewed: Acceptable for surgery.  No visits with results within 3 Day(s) from this visit.  Latest known visit with results is:  Office Visit on 01/18/2021  Component Date Value Ref Range Status   WBC 01/18/2021 6.5  3.4 - 10.8 x10E3/uL Final   RBC 01/18/2021 4.33  3.77 - 5.28 x10E6/uL Final   Hemoglobin 01/18/2021 13.6  11.1 - 15.9 g/dL Final   Hematocrit 01/18/2021 39.8  34.0 - 46.6 % Final   MCV 01/18/2021 92  79 - 97 fL Final   MCH 01/18/2021 31.4  26.6 - 33.0 pg Final   MCHC 01/18/2021 34.2  31.5 - 35.7 g/dL Final   RDW 01/18/2021 12.8  11.7 - 15.4 % Final   Platelets 01/18/2021 189  150 -  450 x10E3/uL Final   Neutrophils 01/18/2021 68  Not Estab. % Final   Lymphs 01/18/2021 23  Not Estab. % Final   Monocytes 01/18/2021 8  Not Estab. % Final   Eos 01/18/2021 0  Not Estab. % Final   Basos 01/18/2021 1  Not Estab. % Final   Neutrophils Absolute 01/18/2021 4.5  1.4 - 7.0 x10E3/uL Final   Lymphocytes Absolute 01/18/2021 1.5  0.7 - 3.1 x10E3/uL Final   Monocytes Absolute 01/18/2021 0.5  0.1 - 0.9 x10E3/uL Final   EOS (ABSOLUTE) 01/18/2021 0.0  0.0 - 0.4 x10E3/uL Final   Basophils Absolute 01/18/2021 0.1  0.0 - 0.2 x10E3/uL Final   Immature Granulocytes 01/18/2021 0  Not Estab. % Final   Immature Grans (Abs) 01/18/2021 0.0  0.0 - 0.1 x10E3/uL Final   Glucose 01/18/2021 104 (A) 65 - 99 mg/dL Final   BUN 01/18/2021 18  8 - 27 mg/dL Final   Creatinine, Ser 01/18/2021 0.76  0.57 - 1.00 mg/dL Final   eGFR 01/18/2021 82  >59 mL/min/1.73 Final   BUN/Creatinine Ratio 01/18/2021 24  12 - 28 Final   Sodium 01/18/2021 138  134 - 144 mmol/L Final   Potassium 01/18/2021 3.8  3.5 - 5.2 mmol/L Final   Chloride 01/18/2021 98  96 - 106 mmol/L Final   CO2 01/18/2021 25  20 - 29 mmol/L Final   Calcium 01/18/2021 9.5  8.7 - 10.3 mg/dL Final   Total Protein 01/18/2021 6.5  6.0 - 8.5 g/dL Final   Albumin 01/18/2021 4.3  3.7 - 4.7 g/dL Final   Globulin, Total 01/18/2021 2.2  1.5 - 4.5 g/dL Final   Albumin/Globulin Ratio 01/18/2021 2.0  1.2 - 2.2 Final   Bilirubin Total 01/18/2021 0.4  0.0 - 1.2 mg/dL Final   Alkaline Phosphatase 01/18/2021 89  44 - 121 IU/L Final   AST 01/18/2021 17  0 - 40 IU/L Final   ALT 01/18/2021 16  0 - 32 IU/L Final   Cholesterol, Total 01/18/2021 152  100 - 199 mg/dL Final   Triglycerides 01/18/2021 53  0 - 149 mg/dL Final   HDL 01/18/2021 61  >39 mg/dL Final   VLDL Cholesterol Cal 01/18/2021 11  5 - 40 mg/dL Final   LDL Chol Calc (NIH) 01/18/2021 80  0 - 99 mg/dL Final   Chol/HDL Ratio 01/18/2021 2.5  0.0 - 4.4 ratio Final   Comment:                                    T. Chol/HDL Ratio                                             Men  Women                               1/2 Avg.Risk  3.4    3.3                                   Avg.Risk  5.0    4.4                                2X Avg.Risk  9.6    7.1                                3X Avg.Risk 23.4   11.0    Interpretation 01/18/2021 Note   Final   Supplemental report is available.    ECG: Date: 02/18/2021 Time ECG obtained: 1135 AM Rate: 67 bpm Rhythm: normal sinus Axis (leads I and aVF): Normal Intervals: PR 172 ms. QRS 88 ms. QTc 433 ms. ST segment and T wave changes: No evidence  of acute ST segment elevation or depression Comparison: Similar to previous tracing obtained on 07/22/2020   IMAGING / PROCEDURES: PULMONARY FUNCTION TESTING performed on 12/31/2020 PFT Results Latest Ref Rng & Units 12/31/2020  FVC-Pre L 1.46  FVC-Predicted Pre % 53  FVC-Post L 1.66  FVC-Predicted Post % 60  Pre FEV1/FVC % % 65  Post FEV1/FCV % % 68  FEV1-Pre L 0.95  FEV1-Predicted Pre % 46  FEV1-Post L 1.12  DLCO uncorrected ml/min/mmHg 16.26  DLCO UNC% % 86  DLCO corrected ml/min/mmHg 16.26  DLCO COR %Predicted % 86  DLVA Predicted % 108   DIAGNOSTIC RADIOGRAPHS ESOPHAGUS WITH SINGLE CONTRAST MEDIUM performed on 12/24/2020 Tertiary contractions of the esophagus as can be seen with esophageal spasm or presbyesophagus Mild gastroesophageal reflux Moderate size hiatal hernia  CORONARY CTA performed on 08/24/2020 Coronary calcium score of 509.  This was 87th percentile for age and sex matched control Normal coronary origin with RIGHT dominance Mild CAD; CAD RADS 2.  Mild nonobstructive CAD (25-49%).  Consider nonatherosclerotic causes of chest pain.  Consider preventative therapy and risk factor modification. Aortic atherosclerosis No other acute findings in the imaged extracardiac chest  TRANSTHORACIC ECHOCARDIOGRAM performed on 07/23/2018 Left ventricular ejection fraction, by estimation, is 70 to  75%. The left ventricle has hyperdynamic function. The left ventricle has no  regional wall motion abnormalities. There is mild left ventricular hypertrophy. Left ventricular diastolic parameters are consistent with Grade I diastolic dysfunction (impaired relaxation).  Right ventricular systolic function is normal. The right ventricular size is normal.  The mitral valve is normal in structure. No evidence of mitral valve regurgitation. No evidence of mitral stenosis.  The aortic valve is normal in structure. There is moderate calcification of the aortic valve. There is mild thickening of the aortic valve. Aortic valve regurgitation is not visualized. Mild to moderate aortic valve sclerosis/calcification is present, without any evidence of aortic stenosis.  The inferior vena cava is normal in size with greater than 50% respiratory variability, suggesting right atrial pressure of 3 mmHg.   LONG TERM CARDIAC EVENT MONITOR STUDY performed on 12/04/2019 Predominant underlying sinus rhythm with a minimum heart rate of 47 bpm, maximum heart rate 176 bpm, and average heart rate of 72 bpm 22 SVT runs occurred, with the fastest interval lasting 7 beats at a maximum heart rate of 176 bpm, and the longest lasting 18 beats with average heart rate of 122 bpm. Occasional PACs with a 1.1% burden Rare PVCs with a <1.0% burden; ventricular bigeminy present No ventricular tachycardia, pauses, or blocks noted. No atrial fibrillation present  LEXISCAN performed on 09/04/2019 The left ventricular ejection fraction is hyperdynamic at 68% Medium defect of moderate severity present in the basal inferolateral and apical lateral location No evidence of ischemia or MI attenuation noted in the inferolateral wall No ST segment deviation noted during stress Low risk study  TRANSTHORACIC ECHOCARDIOGRAM performed on 09/02/2019 Left ventricular ejection fraction, by estimation, is 60 to 65%. The left ventricle has normal  function. The left ventricle has no regional  wall motion abnormalities. Left ventricular diastolic parameters are consistent with Grade II diastolic dysfunction (pseudonormalization).  Right ventricular systolic function is normal. The right ventricular size is normal. There is mildly elevated pulmonary artery systolic pressure. Left atrial size was mildly dilated.  The mitral valve is normal in structure. Mild mitral valve regurgitation. No evidence of mitral stenosis.  The aortic valve is normal in structure. Aortic valve regurgitation is not visualized. Mild to moderate aortic  valve sclerosis/calcification is present, without any evidence of aortic stenosis.  There is mild dilatation of the ascending aorta measuring 39 mm.  The inferior vena cava is normal in size with greater than 50% respiratory variability, suggesting right atrial pressure of 3 mmHg.   Impression and Plan:  Leronda Lewers Chaviano has been referred for pre-anesthesia review and clearance prior to her undergoing the planned anesthetic and procedural courses. Available labs, pertinent testing, and imaging results were personally reviewed by me. This patient has been appropriately cleared by cardiology (ACCEPTABLE) and pulmonary medicine (LOW) with the individually indicated risks of potential significant perioperative complications.  Based on clinical review performed today (02/18/21), barring any significant acute changes in the patient's overall condition, it is anticipated that she will be able to proceed with the planned surgical intervention. Any acute changes in clinical condition may necessitate her procedure being postponed and/or cancelled. Patient will meet with anesthesia team (MD and/or CRNA) on the day of her procedure for preoperative evaluation/assessment. Questions regarding anesthetic course will be fielded at that time.   Pre-surgical instructions were reviewed with the patient during her PAT appointment and questions were  fielded by PAT clinical staff. Patient was advised that if any questions or concerns arise prior to her procedure then she should return a call to PAT and/or her surgeon's office to discuss.  Honor Loh, MSN, APRN, FNP-C, CEN [facility]  Peri-operative Services Nurse Practitioner Phone: [phone] Fax: [phone] 02/18/21 11:40 AM  NOTE: This note has been prepared using Dragon dictation software. Despite my best ability to proofread, there is always the potential that unintentional transcriptional errors may still occur from this process.

## 2021-02-18 ENCOUNTER — Other Ambulatory Visit: Payer: Medicare Other

## 2021-02-18 ENCOUNTER — Other Ambulatory Visit: Payer: Self-pay

## 2021-02-18 ENCOUNTER — Encounter
Admission: RE | Admit: 2021-02-18 | Discharge: 2021-02-18 | Disposition: A | Discharge status: Home / Self Care | Payer: Medicare Other | Source: Ambulatory Visit | Attending: Surgery | Admitting: Surgery

## 2021-02-18 DIAGNOSIS — Z01812 Encounter for preprocedural laboratory examination: Secondary | ICD-10-CM | POA: Insufficient documentation

## 2021-02-18 DIAGNOSIS — Z20822 Contact with and (suspected) exposure to covid-19: Secondary | ICD-10-CM | POA: Diagnosis not present

## 2021-02-18 DIAGNOSIS — Z01810 Encounter for preprocedural cardiovascular examination: Secondary | ICD-10-CM | POA: Diagnosis not present

## 2021-02-18 LAB — SARS CORONAVIRUS 2 (TAT 6-24 HRS): SARS Coronavirus 2: NEGATIVE

## 2021-02-22 ENCOUNTER — Other Ambulatory Visit: Payer: Self-pay

## 2021-02-22 ENCOUNTER — Inpatient Hospital Stay: Payer: Medicare Other | Admitting: Urgent Care

## 2021-02-22 ENCOUNTER — Inpatient Hospital Stay
Admission: RE | Admit: 2021-02-22 | Discharge: 2021-02-24 | DRG: 328 | Disposition: A | Discharge status: Home / Self Care | Payer: Medicare Other | Attending: Surgery | Admitting: Surgery

## 2021-02-22 ENCOUNTER — Encounter
Admission: RE | Disposition: A | Discharge status: Home / Self Care | Payer: Self-pay | Source: Home / Self Care | Attending: Surgery

## 2021-02-22 ENCOUNTER — Encounter: Payer: Self-pay | Admitting: Surgery

## 2021-02-22 DIAGNOSIS — Z96652 Presence of left artificial knee joint: Secondary | ICD-10-CM | POA: Diagnosis present

## 2021-02-22 DIAGNOSIS — Z86010 Personal history of colonic polyps: Secondary | ICD-10-CM

## 2021-02-22 DIAGNOSIS — Z8719 Personal history of other diseases of the digestive system: Secondary | ICD-10-CM

## 2021-02-22 DIAGNOSIS — Z888 Allergy status to other drugs, medicaments and biological substances status: Secondary | ICD-10-CM

## 2021-02-22 DIAGNOSIS — I2510 Atherosclerotic heart disease of native coronary artery without angina pectoris: Secondary | ICD-10-CM | POA: Diagnosis present

## 2021-02-22 DIAGNOSIS — I10 Essential (primary) hypertension: Secondary | ICD-10-CM | POA: Diagnosis not present

## 2021-02-22 DIAGNOSIS — E785 Hyperlipidemia, unspecified: Secondary | ICD-10-CM | POA: Diagnosis present

## 2021-02-22 DIAGNOSIS — Z8249 Family history of ischemic heart disease and other diseases of the circulatory system: Secondary | ICD-10-CM

## 2021-02-22 DIAGNOSIS — K449 Diaphragmatic hernia without obstruction or gangrene: Secondary | ICD-10-CM | POA: Diagnosis not present

## 2021-02-22 DIAGNOSIS — G8929 Other chronic pain: Secondary | ICD-10-CM | POA: Diagnosis present

## 2021-02-22 DIAGNOSIS — F17210 Nicotine dependence, cigarettes, uncomplicated: Secondary | ICD-10-CM | POA: Diagnosis not present

## 2021-02-22 DIAGNOSIS — Z90710 Acquired absence of both cervix and uterus: Secondary | ICD-10-CM | POA: Diagnosis not present

## 2021-02-22 DIAGNOSIS — M25512 Pain in left shoulder: Secondary | ICD-10-CM | POA: Diagnosis present

## 2021-02-22 DIAGNOSIS — J449 Chronic obstructive pulmonary disease, unspecified: Secondary | ICD-10-CM | POA: Diagnosis present

## 2021-02-22 DIAGNOSIS — K219 Gastro-esophageal reflux disease without esophagitis: Secondary | ICD-10-CM | POA: Diagnosis not present

## 2021-02-22 DIAGNOSIS — Z887 Allergy status to serum and vaccine status: Secondary | ICD-10-CM | POA: Diagnosis not present

## 2021-02-22 DIAGNOSIS — Z9851 Tubal ligation status: Secondary | ICD-10-CM

## 2021-02-22 HISTORY — DX: Supraventricular tachycardia, unspecified: I47.10

## 2021-02-22 HISTORY — DX: Atherosclerotic heart disease of native coronary artery without angina pectoris: I25.10

## 2021-02-22 HISTORY — DX: Type 2 diabetes mellitus without complications: E11.9

## 2021-02-22 HISTORY — DX: Thoracic aortic ectasia: I77.810

## 2021-02-22 HISTORY — DX: Personal history of other diseases of the digestive system: Z87.19

## 2021-02-22 HISTORY — DX: Diaphragmatic hernia without obstruction or gangrene: K44.9

## 2021-02-22 HISTORY — DX: Atrial premature depolarization: I49.1

## 2021-02-22 HISTORY — DX: Other specified disease of esophagus: K22.89

## 2021-02-22 HISTORY — DX: Supraventricular tachycardia: I47.1

## 2021-02-22 HISTORY — PX: XI ROBOTIC ASSISTED PARAESOPHAGEAL HERNIA REPAIR: SHX6871

## 2021-02-22 HISTORY — DX: Esophageal obstruction: K22.2

## 2021-02-22 HISTORY — DX: Atherosclerosis of aorta: I70.0

## 2021-02-22 HISTORY — DX: Other ill-defined heart diseases: I51.89

## 2021-02-22 LAB — GLUCOSE, CAPILLARY
Glucose-Capillary: 98 mg/dL (ref 70–99)
Glucose-Capillary: 104 mg/dL — ABNORMAL HIGH (ref 70–99)

## 2021-02-22 LAB — CBC
HCT: 38.2 % (ref 36.0–46.0)
Hemoglobin: 13 g/dL (ref 12.0–15.0)
MCH: 32 pg (ref 26.0–34.0)
MCHC: 34 g/dL (ref 30.0–36.0)
MCV: 94.1 fL (ref 80.0–100.0)
Platelets: 137 10*3/uL — ABNORMAL LOW (ref 150–400)
RBC: 4.06 MIL/uL (ref 3.87–5.11)
RDW: 13.2 % (ref 11.5–15.5)
WBC: 5.4 10*3/uL (ref 4.0–10.5)
nRBC: 0 % (ref 0.0–0.2)

## 2021-02-22 LAB — CREATININE, SERUM
Creatinine, Ser: 0.84 mg/dL (ref 0.44–1.00)
GFR, Estimated: >60 mL/min (ref >60)

## 2021-02-22 SURGERY — REPAIR, HERNIA, PARAESOPHAGEAL, ROBOT-ASSISTED
Anesthesia: General

## 2021-02-22 MED ORDER — ORAL CARE MOUTH RINSE: 15.0000 mL | Freq: Once | OROMUCOSAL | Status: AC

## 2021-02-22 MED ORDER — MIDAZOLAM HCL 2 MG/2ML IJ SOLN
INTRAMUSCULAR | Status: DC | PRN
  Administered 2021-02-22: 2 mg via INTRAVENOUS

## 2021-02-22 MED ORDER — BUPIVACAINE LIPOSOME 1.3 % IJ SUSP
INTRAMUSCULAR | Status: DC | PRN
  Administered 2021-02-22: 20 mL

## 2021-02-22 MED ORDER — ENOXAPARIN SODIUM 40 MG/0.4ML IJ SOSY
40.0000 mg | PREFILLED_SYRINGE | INTRAMUSCULAR | Status: DC
  Administered 2021-02-23 – 2021-02-24 (×2): 40 mg via SUBCUTANEOUS
  Filled 2021-02-22 (×2): qty 0.4

## 2021-02-22 MED ORDER — HEPARIN SODIUM (PORCINE) 5000 UNIT/ML IJ SOLN
INTRAMUSCULAR | Status: AC
  Administered 2021-02-22: 5000 [IU] via SUBCUTANEOUS
  Filled 2021-02-22: qty 1

## 2021-02-22 MED ORDER — CELECOXIB 200 MG PO CAPS: 200.0000 mg | ORAL_CAPSULE | ORAL | Status: AC

## 2021-02-22 MED ORDER — LISINOPRIL 20 MG PO TABS
20.0000 mg | ORAL_TABLET | Freq: Every day | ORAL | Status: DC
  Administered 2021-02-23 – 2021-02-24 (×2): 20 mg via ORAL
  Filled 2021-02-22 (×2): qty 1

## 2021-02-22 MED ORDER — BUPIVACAINE LIPOSOME 1.3 % IJ SUSP
INTRAMUSCULAR | Status: AC
  Filled 2021-02-22: qty 20

## 2021-02-22 MED ORDER — KETAMINE HCL 50 MG/5ML IJ SOSY
PREFILLED_SYRINGE | INTRAMUSCULAR | Status: AC
  Filled 2021-02-22: qty 10

## 2021-02-22 MED ORDER — DIAZEPAM 5 MG PO TABS: 5.0000 mg | ORAL_TABLET | Freq: Three times a day (TID) | ORAL | Status: DC | PRN

## 2021-02-22 MED ORDER — FENTANYL CITRATE (PF) 100 MCG/2ML IJ SOLN
INTRAMUSCULAR | Status: AC
  Filled 2021-02-22: qty 2

## 2021-02-22 MED ORDER — 0.9 % SODIUM CHLORIDE (POUR BTL) OPTIME
TOPICAL | Status: DC | PRN
  Administered 2021-02-22: 200 mL

## 2021-02-22 MED ORDER — ONDANSETRON HCL 4 MG/2ML IJ SOLN: 4.0000 mg | Freq: Once | INTRAMUSCULAR | Status: DC | PRN

## 2021-02-22 MED ORDER — CHLORHEXIDINE GLUCONATE CLOTH 2 % EX PADS: 6.0000 | MEDICATED_PAD | Freq: Once | CUTANEOUS | Status: DC

## 2021-02-22 MED ORDER — SODIUM CHLORIDE 0.9 % IV SOLN: INTRAVENOUS | Status: DC

## 2021-02-22 MED ORDER — ONDANSETRON 4 MG PO TBDP: 4.0000 mg | ORAL_TABLET | Freq: Four times a day (QID) | ORAL | Status: DC | PRN

## 2021-02-22 MED ORDER — BUPROPION HCL ER (XL) 150 MG PO TB24
150.0000 mg | ORAL_TABLET | Freq: Every day | ORAL | Status: DC
  Administered 2021-02-23 – 2021-02-24 (×2): 150 mg via ORAL
  Filled 2021-02-22 (×2): qty 1

## 2021-02-22 MED ORDER — GABAPENTIN 300 MG PO CAPS
ORAL_CAPSULE | ORAL | Status: AC
  Administered 2021-02-22: 300 mg via ORAL
  Filled 2021-02-22: qty 1

## 2021-02-22 MED ORDER — BUPIVACAINE-EPINEPHRINE 0.25% -1:200000 IJ SOLN
INTRAMUSCULAR | Status: DC | PRN
  Administered 2021-02-22: 30 mL

## 2021-02-22 MED ORDER — ISOSORBIDE MONONITRATE ER 30 MG PO TB24
15.0000 mg | ORAL_TABLET | Freq: Every day | ORAL | Status: DC
  Administered 2021-02-23 – 2021-02-24 (×2): 15 mg via ORAL
  Filled 2021-02-22 (×2): qty 1

## 2021-02-22 MED ORDER — PROPOFOL 10 MG/ML IV BOLUS
INTRAVENOUS | Status: DC | PRN
Start: 2021-02-22 — End: 2021-02-22
  Administered 2021-02-22: 100 mg via INTRAVENOUS

## 2021-02-22 MED ORDER — GABAPENTIN 300 MG PO CAPS: 300.0000 mg | ORAL_CAPSULE | ORAL | Status: AC

## 2021-02-22 MED ORDER — NITROGLYCERIN 0.4 MG SL SUBL
0.4000 mg | SUBLINGUAL_TABLET | SUBLINGUAL | Status: DC | PRN
Start: 2021-02-22 — End: 2021-02-24

## 2021-02-22 MED ORDER — CEFAZOLIN SODIUM-DEXTROSE 2-4 GM/100ML-% IV SOLN
2.0000 g | INTRAVENOUS | Status: AC
  Administered 2021-02-22: 2 g via INTRAVENOUS

## 2021-02-22 MED ORDER — FENTANYL CITRATE (PF) 100 MCG/2ML IJ SOLN: 25.0000 ug | INTRAMUSCULAR | Status: DC | PRN

## 2021-02-22 MED ORDER — EPHEDRINE SULFATE 50 MG/ML IJ SOLN
INTRAMUSCULAR | Status: DC | PRN
  Administered 2021-02-22: 5 mg via INTRAVENOUS
  Administered 2021-02-22: 10 mg via INTRAVENOUS
  Administered 2021-02-22: 5 mg via INTRAVENOUS

## 2021-02-22 MED ORDER — ONDANSETRON HCL 4 MG/2ML IJ SOLN: 4.0000 mg | Freq: Four times a day (QID) | INTRAMUSCULAR | Status: DC | PRN

## 2021-02-22 MED ORDER — HEPARIN SODIUM (PORCINE) 5000 UNIT/ML IJ SOLN
5000.0000 [IU] | Freq: Once | INTRAMUSCULAR | Status: AC
Start: 2021-02-22 — End: 2021-02-22

## 2021-02-22 MED ORDER — VISTASEAL 10 ML SINGLE DOSE KIT
PACK | CUTANEOUS | Status: DC | PRN
  Administered 2021-02-22: 10 mL via TOPICAL

## 2021-02-22 MED ORDER — PHENYLEPHRINE HCL (PRESSORS) 10 MG/ML IV SOLN
INTRAVENOUS | Status: DC | PRN
  Administered 2021-02-22 (×3): 100 ug via INTRAVENOUS
  Administered 2021-02-22 (×2): 200 ug via INTRAVENOUS

## 2021-02-22 MED ORDER — GLYCOPYRROLATE 0.2 MG/ML IJ SOLN
INTRAMUSCULAR | Status: DC | PRN
  Administered 2021-02-22: .2 mg via INTRAVENOUS

## 2021-02-22 MED ORDER — HYDROCHLOROTHIAZIDE 12.5 MG PO CAPS
12.5000 mg | ORAL_CAPSULE | Freq: Every day | ORAL | Status: DC
  Administered 2021-02-23 – 2021-02-24 (×2): 12.5 mg via ORAL
  Filled 2021-02-22 (×2): qty 1

## 2021-02-22 MED ORDER — PREGABALIN 50 MG PO CAPS
100.0000 mg | ORAL_CAPSULE | Freq: Three times a day (TID) | ORAL | Status: DC
  Administered 2021-02-22 – 2021-02-24 (×6): 100 mg via ORAL
  Filled 2021-02-22 (×7): qty 2

## 2021-02-22 MED ORDER — PROCHLORPERAZINE MALEATE 10 MG PO TABS
10.0000 mg | ORAL_TABLET | Freq: Four times a day (QID) | ORAL | Status: DC | PRN
  Filled 2021-02-22: qty 1

## 2021-02-22 MED ORDER — KETAMINE HCL 10 MG/ML IJ SOLN
INTRAMUSCULAR | Status: DC | PRN
  Administered 2021-02-22: 20 mg via INTRAVENOUS
  Administered 2021-02-22: 30 mg via INTRAVENOUS

## 2021-02-22 MED ORDER — BUDESON-GLYCOPYRROL-FORMOTEROL 160-9-4.8 MCG/ACT IN AERO: 2.0000 | INHALATION_SPRAY | Freq: Two times a day (BID) | RESPIRATORY_TRACT | Status: DC | PRN

## 2021-02-22 MED ORDER — OXYCODONE HCL 5 MG PO TABS
5.0000 mg | ORAL_TABLET | ORAL | Status: DC | PRN
  Administered 2021-02-22 – 2021-02-24 (×2): 10 mg via ORAL
  Filled 2021-02-22 (×2): qty 2

## 2021-02-22 MED ORDER — METHOCARBAMOL 500 MG PO TABS: 500.0000 mg | ORAL_TABLET | Freq: Four times a day (QID) | ORAL | Status: DC | PRN

## 2021-02-22 MED ORDER — ACETAMINOPHEN 500 MG PO TABS: 1000.0000 mg | ORAL_TABLET | ORAL | Status: AC

## 2021-02-22 MED ORDER — ROCURONIUM BROMIDE 100 MG/10ML IV SOLN
INTRAVENOUS | Status: DC | PRN
  Administered 2021-02-22: 20 mg via INTRAVENOUS
  Administered 2021-02-22: 60 mg via INTRAVENOUS

## 2021-02-22 MED ORDER — DEXAMETHASONE SODIUM PHOSPHATE 10 MG/ML IJ SOLN
INTRAMUSCULAR | Status: DC | PRN
Start: 2021-02-22 — End: 2021-02-22
  Administered 2021-02-22: 10 mg via INTRAVENOUS

## 2021-02-22 MED ORDER — ALBUTEROL SULFATE (2.5 MG/3ML) 0.083% IN NEBU: 2.5000 mg | INHALATION_SOLUTION | Freq: Four times a day (QID) | RESPIRATORY_TRACT | Status: DC | PRN

## 2021-02-22 MED ORDER — HYDROMORPHONE HCL 1 MG/ML IJ SOLN: 0.5000 mg | INTRAMUSCULAR | Status: DC | PRN

## 2021-02-22 MED ORDER — CEFAZOLIN SODIUM-DEXTROSE 2-4 GM/100ML-% IV SOLN
INTRAVENOUS | Status: AC
  Filled 2021-02-22: qty 100

## 2021-02-22 MED ORDER — TRAMADOL HCL 50 MG PO TABS
50.0000 mg | ORAL_TABLET | Freq: Four times a day (QID) | ORAL | Status: DC | PRN
Start: 2021-02-22 — End: 2021-02-24

## 2021-02-22 MED ORDER — TRAZODONE HCL 100 MG PO TABS
100.0000 mg | ORAL_TABLET | Freq: Every day | ORAL | Status: DC
  Administered 2021-02-22 – 2021-02-23 (×2): 100 mg via ORAL
  Filled 2021-02-22 (×2): qty 1

## 2021-02-22 MED ORDER — CHLORHEXIDINE GLUCONATE 0.12 % MT SOLN
OROMUCOSAL | Status: AC
  Administered 2021-02-22: 15 mL via OROMUCOSAL
  Filled 2021-02-22: qty 15

## 2021-02-22 MED ORDER — KETOROLAC TROMETHAMINE 15 MG/ML IJ SOLN
15.0000 mg | Freq: Four times a day (QID) | INTRAMUSCULAR | Status: DC
  Administered 2021-02-22 – 2021-02-24 (×7): 15 mg via INTRAVENOUS
  Filled 2021-02-22 (×7): qty 1

## 2021-02-22 MED ORDER — MIDAZOLAM HCL 2 MG/2ML IJ SOLN
INTRAMUSCULAR | Status: AC
  Filled 2021-02-22: qty 2

## 2021-02-22 MED ORDER — ACETAMINOPHEN 500 MG PO TABS
1000.0000 mg | ORAL_TABLET | Freq: Four times a day (QID) | ORAL | Status: DC
  Administered 2021-02-22 – 2021-02-24 (×6): 1000 mg via ORAL
  Filled 2021-02-22 (×7): qty 2

## 2021-02-22 MED ORDER — DIPHENHYDRAMINE HCL 50 MG/ML IJ SOLN: 12.5000 mg | Freq: Four times a day (QID) | INTRAMUSCULAR | Status: DC | PRN

## 2021-02-22 MED ORDER — CHLORHEXIDINE GLUCONATE 0.12 % MT SOLN: 15.0000 mL | Freq: Once | OROMUCOSAL | Status: AC

## 2021-02-22 MED ORDER — SUGAMMADEX SODIUM 200 MG/2ML IV SOLN
INTRAVENOUS | Status: DC | PRN
Start: 2021-02-22 — End: 2021-02-22
  Administered 2021-02-22: 500 mg via INTRAVENOUS

## 2021-02-22 MED ORDER — FENTANYL CITRATE (PF) 100 MCG/2ML IJ SOLN
INTRAMUSCULAR | Status: DC | PRN
  Administered 2021-02-22 (×2): 50 ug via INTRAVENOUS

## 2021-02-22 MED ORDER — LISINOPRIL-HYDROCHLOROTHIAZIDE 20-12.5 MG PO TABS: 2.0000 | ORAL_TABLET | Freq: Every morning | ORAL | Status: DC

## 2021-02-22 MED ORDER — PROPOFOL 10 MG/ML IV BOLUS
INTRAVENOUS | Status: AC
  Filled 2021-02-22: qty 20

## 2021-02-22 MED ORDER — VISTASEAL 10 ML SINGLE DOSE KIT
PACK | CUTANEOUS | Status: AC
  Filled 2021-02-22: qty 10

## 2021-02-22 MED ORDER — DIPHENHYDRAMINE HCL 12.5 MG/5ML PO ELIX
12.5000 mg | ORAL_SOLUTION | Freq: Four times a day (QID) | ORAL | Status: DC | PRN
  Filled 2021-02-22: qty 5

## 2021-02-22 MED ORDER — BUPIVACAINE-EPINEPHRINE (PF) 0.25% -1:200000 IJ SOLN
INTRAMUSCULAR | Status: AC
  Filled 2021-02-22: qty 30

## 2021-02-22 MED ORDER — CHLORHEXIDINE GLUCONATE CLOTH 2 % EX PADS
6.0000 | MEDICATED_PAD | Freq: Once | CUTANEOUS | Status: AC
  Administered 2021-02-22: 6 via TOPICAL

## 2021-02-22 MED ORDER — PROCHLORPERAZINE EDISYLATE 10 MG/2ML IJ SOLN: 5.0000 mg | Freq: Four times a day (QID) | INTRAMUSCULAR | Status: DC | PRN

## 2021-02-22 MED ORDER — ALBUMIN HUMAN 5 % IV SOLN: INTRAVENOUS | Status: DC | PRN

## 2021-02-22 MED ORDER — ALBUTEROL SULFATE HFA 108 (90 BASE) MCG/ACT IN AERS
INHALATION_SPRAY | RESPIRATORY_TRACT | Status: DC | PRN
  Administered 2021-02-22 (×2): 4 via RESPIRATORY_TRACT

## 2021-02-22 MED ORDER — SODIUM CHLORIDE 0.9 % IV SOLN
INTRAVENOUS | Status: DC | PRN
  Administered 2021-02-22: 20 ug/min via INTRAVENOUS

## 2021-02-22 MED ORDER — ACETAMINOPHEN 500 MG PO TABS
ORAL_TABLET | ORAL | Status: AC
  Administered 2021-02-22: 1000 mg via ORAL
  Filled 2021-02-22: qty 2

## 2021-02-22 MED ORDER — LIDOCAINE HCL (CARDIAC) PF 100 MG/5ML IV SOSY
PREFILLED_SYRINGE | INTRAVENOUS | Status: DC | PRN
  Administered 2021-02-22: 50 mg via INTRAVENOUS

## 2021-02-22 MED ORDER — CELECOXIB 200 MG PO CAPS
ORAL_CAPSULE | ORAL | Status: AC
  Administered 2021-02-22: 200 mg via ORAL
  Filled 2021-02-22: qty 1

## 2021-02-22 MED ORDER — CEFAZOLIN SODIUM-DEXTROSE 2-4 GM/100ML-% IV SOLN
2.0000 g | Freq: Three times a day (TID) | INTRAVENOUS | Status: AC
Start: 2021-02-22 — End: 2021-02-23
  Administered 2021-02-22 – 2021-02-23 (×2): 2 g via INTRAVENOUS
  Filled 2021-02-22 (×2): qty 100

## 2021-02-22 SURGICAL SUPPLY — 59 items
APPLICATOR VISTASEAL FLEXIBLE (MISCELLANEOUS) ×2 IMPLANT
CANNULA REDUC XI 12-8 STAPL (CANNULA) ×1
CANNULA REDUCER 12-8 DVNC XI (CANNULA) ×1 IMPLANT
CHLORAPREP W/TINT 26 (MISCELLANEOUS) ×2 IMPLANT
DECANTER SPIKE VIAL GLASS SM (MISCELLANEOUS) ×2 IMPLANT
DEFOGGER SCOPE WARMER CLEARIFY (MISCELLANEOUS) ×2 IMPLANT
DERMABOND ADVANCED (GAUZE/BANDAGES/DRESSINGS) ×1
DERMABOND ADVANCED .7 DNX12 (GAUZE/BANDAGES/DRESSINGS) ×1 IMPLANT
DRAPE 3/4 80X56 (DRAPES) IMPLANT
DRAPE ARM DVNC X/XI (DISPOSABLE) ×4 IMPLANT
DRAPE COLUMN DVNC XI (DISPOSABLE) ×1 IMPLANT
DRAPE DA VINCI XI ARM (DISPOSABLE) ×4
DRAPE DA VINCI XI COLUMN (DISPOSABLE) ×1
ELECT CAUTERY BLADE 6.4 (BLADE) ×2 IMPLANT
ELECT REM PT RETURN 9FT ADLT (ELECTROSURGICAL) ×2
ELECTRODE REM PT RTRN 9FT ADLT (ELECTROSURGICAL) ×1 IMPLANT
GAUZE 4X4 16PLY ~~LOC~~+RFID DBL (SPONGE) ×2 IMPLANT
GLOVE SURG ENC MOIS LTX SZ7 (GLOVE) ×12 IMPLANT
GOWN STRL REUS W/ TWL LRG LVL3 (GOWN DISPOSABLE) ×4 IMPLANT
GOWN STRL REUS W/TWL LRG LVL3 (GOWN DISPOSABLE) ×4
GRASPER LAPSCPC 5X45 DSP (INSTRUMENTS) ×2 IMPLANT
IRRIGATION STRYKERFLOW (MISCELLANEOUS) IMPLANT
IRRIGATOR STRYKERFLOW (MISCELLANEOUS)
IV NS 1000ML (IV SOLUTION)
IV NS 1000ML BAXH (IV SOLUTION) IMPLANT
KIT PINK PAD W/HEAD ARE REST (MISCELLANEOUS) ×2
KIT PINK PAD W/HEAD ARM REST (MISCELLANEOUS) ×1 IMPLANT
KIT TURNOVER CYSTO (KITS) ×2 IMPLANT
LABEL OR SOLS (LABEL) ×2 IMPLANT
MANIFOLD NEPTUNE II (INSTRUMENTS) ×2 IMPLANT
MESH BIO-A 7X10 SYN MAT (Mesh General) ×2 IMPLANT
NEEDLE HYPO 22GX1.5 SAFETY (NEEDLE) ×2 IMPLANT
OBTURATOR OPTICAL STANDARD 8MM (TROCAR) ×1
OBTURATOR OPTICAL STND 8 DVNC (TROCAR) ×1
OBTURATOR OPTICALSTD 8 DVNC (TROCAR) ×1 IMPLANT
PACK LAP CHOLECYSTECTOMY (MISCELLANEOUS) ×2 IMPLANT
PENCIL ELECTRO HAND CTR (MISCELLANEOUS) ×2 IMPLANT
SEAL CANN UNIV 5-8 DVNC XI (MISCELLANEOUS) ×3 IMPLANT
SEAL XI 5MM-8MM UNIVERSAL (MISCELLANEOUS) ×3
SEALER VESSEL DA VINCI XI (MISCELLANEOUS) ×1
SEALER VESSEL EXT DVNC XI (MISCELLANEOUS) ×1 IMPLANT
SOLUTION ELECTROLUBE (MISCELLANEOUS) ×2 IMPLANT
SPONGE T-LAP 18X18 ~~LOC~~+RFID (SPONGE) ×2 IMPLANT
STAPLER CANNULA SEAL DVNC XI (STAPLE) ×1 IMPLANT
STAPLER CANNULA SEAL XI (STAPLE) ×1
SUT MNCRL 4-0 (SUTURE) ×1
SUT MNCRL 4-0 27XMFL (SUTURE) ×1
SUT SILK 2 0 SH (SUTURE) ×6 IMPLANT
SUT VICRYL 0 AB UR-6 (SUTURE) ×4 IMPLANT
SUT VLOC 90 S/L VL9 GS22 (SUTURE) ×2 IMPLANT
SUTURE MNCRL 4-0 27XMF (SUTURE) ×1 IMPLANT
SYR 20ML LL LF (SYRINGE) ×2 IMPLANT
SYR 30ML LL (SYRINGE) ×2 IMPLANT
TAPE TRANSPORE STRL 2 31045 (GAUZE/BANDAGES/DRESSINGS) ×2 IMPLANT
TRAY FOLEY SLVR 16FR LF STAT (SET/KITS/TRAYS/PACK) ×2 IMPLANT
TROCAR BALLN GELPORT 12X130M (ENDOMECHANICALS) ×2 IMPLANT
TROCAR XCEL NON-BLD 5MMX100MML (ENDOMECHANICALS) ×2 IMPLANT
TUBING EVAC SMOKE HEATED PNEUM (TUBING) ×2 IMPLANT
WATER STERILE IRR 500ML POUR (IV SOLUTION) IMPLANT

## 2021-02-22 NOTE — Anesthesia Preprocedure Evaluation (Signed)
Anesthesia Evaluation  Patient identified by MRN, date of birth, ID band Patient awake    Reviewed: Allergy & Precautions, NPO status , Patient's Chart, lab work & pertinent test results  History of Anesthesia Complications Negative for: history of anesthetic complications  Airway Mallampati: I  TM Distance: >3 FB Neck ROM: Full    Dental  (+) Edentulous Upper, Edentulous Lower   Pulmonary asthma , COPD, Current SmokerPatient did not abstain from smoking.,    breath sounds clear to auscultation- rhonchi (-) wheezing      Cardiovascular hypertension, Pt. on medications (-) Cardiac Stents and (-) CABG CAD: seen on CT, nonobstructive.   Rhythm:Regular Rate:Normal - Systolic murmurs and - Diastolic murmurs Echo 1/44/81: 1. Left ventricular ejection fraction, by estimation, is 70 to 75%. The  left ventricle has hyperdynamic function. The left ventricle has no  regional wall motion abnormalities. There is mild left ventricular  hypertrophy. Left ventricular diastolic  parameters are consistent with Grade I diastolic dysfunction (impaired  relaxation).  2. Right ventricular systolic function is normal. The right ventricular  size is normal.  3. The mitral valve is normal in structure. No evidence of mitral valve  regurgitation. No evidence of mitral stenosis.  4. The aortic valve is normal in structure. There is moderate  calcification of the aortic valve. There is mild thickening of the aortic  valve. Aortic valve regurgitation is not visualized. Mild to moderate  aortic valve sclerosis/calcification is  present, without any evidence of aortic stenosis.  5. The inferior vena cava is normal in size with greater than 50%  respiratory variability, suggesting right atrial pressure of 3 mmHg.    Neuro/Psych neg Seizures PSYCHIATRIC DISORDERS Anxiety Depression negative neurological ROS     GI/Hepatic Neg liver ROS, hiatal  hernia, GERD  ,  Endo/Other  diabetes, Oral Hypoglycemic Agents  Renal/GU negative Renal ROS     Musculoskeletal  (+) Arthritis ,   Abdominal (+) - obese,   Peds  Hematology negative hematology ROS (+)   Anesthesia Other Findings Past Medical History: No date: Anxiety No date: Aortic atherosclerosis (HCC) No date: Ascending aorta dilation (HCC)     Comment:  a.) TTE 09/02/19 --> mild; measured 10mm. No date: Asthma No date: CAD (coronary artery disease)     Comment:  a,) CTA 08/24/20 --> mild CAD (25-49%) in RCA, LAD, LCx;              CAC/Agatston score 509. No date: Cataract 09/27/2015: Chronic left shoulder pain No date: COPD (chronic obstructive pulmonary disease) (HCC) No date: Depression No date: Diastolic dysfunction     Comment:  a.) TTE 09/02/19 --> LVEF 60-65%; G2DD. b.) TTE 07/23/20              --> LVEF 70-75%; G1DD. No date: GERD (gastroesophageal reflux disease) No date: History of kidney stones No date: Hyperlipidemia No date: Hypertension No date: Lupus ([place]) No date: Myocardial infarction ([place]) No date: Osteoarthritis No date: PAC (premature atrial contraction)     Comment:  a.) Holter 12/04/19 --> occassional; 1.1% PAC burden. 11/22/2015: Pain of right hip joint No date: Paraesophageal hernia No date: Presbyesophagus 02/04/2015: Primary osteoarthritis of left knee No date: PSVT (paroxysmal supraventricular tachycardia) ([place])     Comment:  a.) Holter 12/04/19 --> 22 runs. No date: Schatzki's ring No date: SOB (shortness of breath) No date: T2DM (type 2 diabetes mellitus) ([place])   Reproductive/Obstetrics  Anesthesia Physical Anesthesia Plan  ASA: 3  Anesthesia Plan: General   Post-op Pain Management:    Induction: Intravenous  PONV Risk Score and Plan: 1 and Ondansetron and Dexamethasone  Airway Management Planned: Oral ETT  Additional Equipment:   Intra-op Plan:   Post-operative  Plan: Extubation in OR  Informed Consent: I have reviewed the patients History and Physical, chart, labs and discussed the procedure including the risks, benefits and alternatives for the proposed anesthesia with the patient or authorized representative who has indicated his/her understanding and acceptance.     Dental advisory given  Plan Discussed with: CRNA and Anesthesiologist  Anesthesia Plan Comments:         Anesthesia Quick Evaluation

## 2021-02-22 NOTE — Anesthesia Postprocedure Evaluation (Signed)
Anesthesia Post Note  Patient: [name] [name]  Procedure(s) Performed: XI ROBOTIC ASSISTED PARAESOPHAGEAL HERNIA REPAIR with RNFA to assist  Patient location during evaluation: PACU Anesthesia Type: General Level of consciousness: awake and alert Pain management: pain level controlled Vital Signs Assessment: post-procedure vital signs reviewed and stable Respiratory status: spontaneous breathing, nonlabored ventilation, respiratory function stable and patient connected to nasal cannula oxygen Cardiovascular status: blood pressure returned to baseline and stable Postop Assessment: no apparent nausea or vomiting Anesthetic complications: no   No notable events documented.   Last Vitals:  Vitals:   02/22/21 1615 02/22/21 1630  BP: 134/64 (!) 147/62  Pulse: 77 72  Resp: 18 18  Temp: 36.4 C   SpO2: 100% 100%    Last Pain:  Vitals:   02/22/21 1615  TempSrc:   PainSc: [name]

## 2021-02-22 NOTE — Op Note (Signed)
Robotic assisted laparoscopic Nissen fundoplication w repair of  paraesophageal hernia w Bio-A Mesh  Pre-operative Diagnosis: GERD, Type III paraesophageal hernia  Post-operative Diagnosis: same  Procedure:  Robotic assisted laparoscopic Nissen fundoplication w repair of  paraesophageal hernia w Mesh  Surgeon: Caroleen Hamman, MD FACS  Anesthesia: Gen. with endotracheal tube  Findings: Type III paraesophageal hernia Loose wrap 360 degree over 50 FR Bougie   Estimated Blood Loss: 15cc       Specimens: Gallbladder           Complications: none   Procedure Details  The patient was seen again in the Holding Room. The benefits, complications, treatment options, and expected outcomes were discussed with the patient. The risks of bleeding, infection, recurrence of symptoms, failure to resolve symptoms,  esophageal damage, Dysphagia, bowel injury, any of which could require further surgery were reviewed with the patient. The likelihood of improving the patient's symptoms with return to their baseline status is good.  The patient and/or family concurred with the proposed plan, giving informed consent.  The patient was taken to Operating Room, identified  and the procedure verified.  A Time Out was held and the above information confirmed.  Prior to the induction of general anesthesia, antibiotic prophylaxis was administered. VTE prophylaxis was in place. General endotracheal anesthesia was then administered and tolerated well. After the induction, the abdomen was prepped with Chloraprep and draped in the sterile fashion. The patient was positioned in the supine position.  Cut down technique was used to enter the abdominal cavity and a Hasson trochar was placed after two vicryl stitches were anchored to the fascia. Pneumoperitoneum was then created with CO2 and tolerated well without any adverse changes in the patient's vital signs.  Three 8-mm ports were placed under direct vision. All skin  incisions  were infiltrated with a local anesthetic agent before making the incision and placing the trocars. An additional 5 mm regular laparoscopic port was placed to assist with retraction and exposure.   The patient was positioned  in reverse Trendelenburg, robot was brought to the surgical field and docked in the standard fashion.  We made sure all the instrumentation was kept indirect view at all times and that there were no collision between the arms. I scrubbed out and went to the console.  I used a attic arm to retract the liver, the vessel sealer on my right hand and a forced bipolar grasper on my left hand.  There is along the extra 5 mm port allow me ample exposure and the ability to perform meticulous dissection  We Started dividing the lesser omentum via the pars flaccida.  We Were able to dissect the lesser curvature of the stomach and  dissected the fundus free from the right and left crus.  We circumferentially dissected the GE junction.  The hernia sac was also completely reduced and we were able to bring the stomach into the intra-abdominal position.  Attention then was turned to the greater curvature where the short gastrics were divided with sealer device.  We were able to identify the left crus and again were able to make sure there was a good circumferential dissection and that the hernia sac was completely excised.  We did perform also a good dissection within the mediastinum to allow a complete reduction of the sac and a to completely allow an intra-abdominal Nissen fundoplication. Bio A 10x 7 cms mesh was placed and secured to the crus with Vistaseal.   2-0V lock suture was  inserted and the crus  was closed with a running suture using a strip of BioA as a pledget  We Asked anesthesia to place a 50 French bougie and this went easily.  We also observed the trajectory of the bougie. 360 degree Nissen fundoplication was created with multiple 2-0 silk sutures and we placed 3  stitches taking some of the esophagus within that bite.  The fundoplication measured approximately 3-1/2 cm and he was floppy. I was very happy with the way the fundoplication laid and the repair of the hernia.  Inspection of the  upper quadrant was performed. No bleeding, bile  Or esophageal injuries leaks, or bowel injuries were noted. Robotic instruments and robotic arms were undocked in the standard fashion. All the needles were removed under direct visualization.   I scrubbed back in.  Pneumoperitoneum was released.  The periumbilical port site was closed with interrumpted 0 Vicryl sutures. 4-0 subcuticular Monocryl was used to close the skin. Liposomal marcaine was injected to all the incisions sites.  Dermabond was  applied.  The patient was then extubated and brought to the recovery room in stable condition. Sponge, lap, and needle counts were correct at closure and at the conclusion of the case.               Caroleen Hamman, MD, FACS

## 2021-02-22 NOTE — Transfer of Care (Signed)
Immediate Anesthesia Transfer of Care Note  Patient: [name] [name]  Procedure(s) Performed: XI ROBOTIC ASSISTED PARAESOPHAGEAL HERNIA REPAIR with RNFA to assist  Patient Location: PACU  Anesthesia Type:General  Level of Consciousness: drowsy, patient cooperative and confused  Airway & Oxygen Therapy: Patient Spontanous Breathing and Patient connected to face mask oxygen  Post-op Assessment: Report given to RN and Post -op Vital signs reviewed and stable  Post vital signs: Reviewed and stable  Last Vitals:  Vitals Value Taken Time  BP 134/64 02/22/21 1615  Temp    Pulse 72 02/22/21 1619  Resp 11 02/22/21 1619  SpO2 100 % 02/22/21 1619  Vitals shown include unvalidated device data.  Last Pain:  Vitals:   02/22/21 1103  TempSrc: Oral  PainSc: 0-No pain         Complications: No notable events documented.

## 2021-02-22 NOTE — Anesthesia Procedure Notes (Signed)
Procedure Name: Intubation Date/Time: 02/22/2021 2:12 PM Performed by: Biagio Borg, CRNA Pre-anesthesia Checklist: Patient identified, Emergency Drugs available, Suction available and Patient being monitored Patient Re-evaluated:Patient Re-evaluated prior to induction Oxygen Delivery Method: Circle system utilized Preoxygenation: Pre-oxygenation with 100% oxygen Induction Type: IV induction Ventilation: Mask ventilation without difficulty Laryngoscope Size: McGraph and 3 Grade View: Grade I Tube type: Oral Tube size: 7.0 mm Number of attempts: 1 Airway Equipment and Method: Stylet and Oral airway Placement Confirmation: ETT inserted through vocal cords under direct vision, positive ETCO2 and breath sounds checked- equal and bilateral Secured at: 22 cm Tube secured with: Tape Dental Injury: Teeth and Oropharynx as per pre-operative assessment

## 2021-02-22 NOTE — Interval H&P Note (Signed)
History and Physical Interval Note:  02/22/2021 1:41 PM  [name] [name]  has presented today for surgery, with the diagnosis of hiatal/paraesophageal hernia.  The various methods of treatment have been discussed with the patient and family. After consideration of risks, benefits and other options for treatment, the patient has consented to  Procedure(s): XI ROBOTIC ASSISTED PARAESOPHAGEAL HERNIA REPAIR with RNFA to assist (N/A) as a surgical intervention.  The patient's history has been reviewed, patient examined, no change in status, stable for surgery.  I have reviewed the patient's chart and labs.  Questions were answered to the patient's satisfaction.     [place]

## 2021-02-23 ENCOUNTER — Encounter: Payer: Self-pay | Admitting: Surgery

## 2021-02-23 DIAGNOSIS — Z887 Allergy status to serum and vaccine status: Secondary | ICD-10-CM | POA: Diagnosis not present

## 2021-02-23 DIAGNOSIS — E785 Hyperlipidemia, unspecified: Secondary | ICD-10-CM | POA: Diagnosis present

## 2021-02-23 DIAGNOSIS — I2510 Atherosclerotic heart disease of native coronary artery without angina pectoris: Secondary | ICD-10-CM | POA: Diagnosis present

## 2021-02-23 DIAGNOSIS — F17210 Nicotine dependence, cigarettes, uncomplicated: Secondary | ICD-10-CM | POA: Diagnosis present

## 2021-02-23 DIAGNOSIS — Z8249 Family history of ischemic heart disease and other diseases of the circulatory system: Secondary | ICD-10-CM | POA: Diagnosis not present

## 2021-02-23 DIAGNOSIS — M25512 Pain in left shoulder: Secondary | ICD-10-CM | POA: Diagnosis present

## 2021-02-23 DIAGNOSIS — Z96652 Presence of left artificial knee joint: Secondary | ICD-10-CM | POA: Diagnosis present

## 2021-02-23 DIAGNOSIS — Z9851 Tubal ligation status: Secondary | ICD-10-CM | POA: Diagnosis not present

## 2021-02-23 DIAGNOSIS — J449 Chronic obstructive pulmonary disease, unspecified: Secondary | ICD-10-CM | POA: Diagnosis present

## 2021-02-23 DIAGNOSIS — I10 Essential (primary) hypertension: Secondary | ICD-10-CM | POA: Diagnosis present

## 2021-02-23 DIAGNOSIS — Z86010 Personal history of colonic polyps: Secondary | ICD-10-CM | POA: Diagnosis not present

## 2021-02-23 DIAGNOSIS — Z888 Allergy status to other drugs, medicaments and biological substances status: Secondary | ICD-10-CM | POA: Diagnosis not present

## 2021-02-23 DIAGNOSIS — Z90710 Acquired absence of both cervix and uterus: Secondary | ICD-10-CM | POA: Diagnosis not present

## 2021-02-23 DIAGNOSIS — K449 Diaphragmatic hernia without obstruction or gangrene: Secondary | ICD-10-CM | POA: Diagnosis present

## 2021-02-23 DIAGNOSIS — G8929 Other chronic pain: Secondary | ICD-10-CM | POA: Diagnosis present

## 2021-02-23 DIAGNOSIS — K219 Gastro-esophageal reflux disease without esophagitis: Secondary | ICD-10-CM | POA: Diagnosis present

## 2021-02-23 LAB — CBC
HCT: 33 % — ABNORMAL LOW (ref 36.0–46.0)
Hemoglobin: 11.6 g/dL — ABNORMAL LOW (ref 12.0–15.0)
MCH: 32.6 pg (ref 26.0–34.0)
MCHC: 35.2 g/dL (ref 30.0–36.0)
MCV: 92.7 fL (ref 80.0–100.0)
Platelets: 142 10*3/uL — ABNORMAL LOW (ref 150–400)
RBC: 3.56 MIL/uL — ABNORMAL LOW (ref 3.87–5.11)
RDW: 12.9 % (ref 11.5–15.5)
WBC: 9.3 10*3/uL (ref 4.0–10.5)
nRBC: 0 % (ref 0.0–0.2)

## 2021-02-23 LAB — BASIC METABOLIC PANEL
Anion gap: 9 (ref 5–15)
BUN: 14 mg/dL (ref 8–23)
CO2: 25 mmol/L (ref 22–32)
Calcium: 8.8 mg/dL — ABNORMAL LOW (ref 8.9–10.3)
Chloride: 99 mmol/L (ref 98–111)
Creatinine, Ser: 0.79 mg/dL (ref 0.44–1.00)
GFR, Estimated: >60 mL/min (ref >60)
Glucose, Bld: 144 mg/dL — ABNORMAL HIGH (ref 70–99)
Potassium: 3.5 mmol/L (ref 3.5–5.1)
Sodium: 133 mmol/L — ABNORMAL LOW (ref 135–145)

## 2021-02-23 LAB — MAGNESIUM: Magnesium: 2.1 mg/dL (ref 1.7–2.4)

## 2021-02-23 LAB — PHOSPHORUS: Phosphorus: 3.8 mg/dL (ref 2.5–4.6)

## 2021-02-23 MED ORDER — PHENOL 1.4 % MT LIQD
1.0000 | OROMUCOSAL | Status: DC | PRN
  Filled 2021-02-23 (×2): qty 177

## 2021-02-23 MED ORDER — CYCLOBENZAPRINE HCL 10 MG PO TABS
5.0000 mg | ORAL_TABLET | Freq: Three times a day (TID) | ORAL | Status: DC
  Administered 2021-02-23 – 2021-02-24 (×4): 5 mg via ORAL
  Filled 2021-02-23 (×4): qty 1

## 2021-02-23 NOTE — Discharge Instructions (Signed)
In addition to included general post-operative instructions,  Diet: Recommend following Nissen diet x4 weeks; I have provided multiple handouts on this  Activity: No heavy lifting >20 pounds (children, pets, laundry, garbage) or strenuous activity for 4 weeks, but light activity and walking are encouraged. Do not drive or drink alcohol if taking narcotic pain medications or having pain that might distract from driving.  Wound care: 2 days after surgery (09/29), you may shower/get incision wet with soapy water and pat dry (do not rub incisions), but no baths or submerging incision underwater until follow-up.   Medications: Resume all home medications. For mild to moderate pain: acetaminophen (Tylenol) or ibuprofen/naproxen (if no kidney disease). Combining Tylenol with alcohol can substantially increase your risk of causing liver disease. Narcotic pain medications, if prescribed, can be used for severe pain, though may cause nausea, constipation, and drowsiness. Do not combine Tylenol and Percocet (or similar) within a 6 hour period as Percocet (and similar) contain(s) Tylenol. If you do not need the narcotic pain medication, you do not need to fill the prescription.  Call office [phone] / [phone]) at any time if any questions, worsening pain, fevers/chills, bleeding, drainage from incision site, or other concerns.

## 2021-02-23 NOTE — Progress Notes (Addendum)
[facility] Day(s): 1.   Post op day(s): 1 Day Post-Op.   Interval History:  Patient seen and examined No acute events or new complaints overnight.  Patient reports she is very sore this morning Additionally complaining of some shortness of breath and wheezing, which she has at baseline No fever, chills, or emesis She did have some trouble swallowing pills overnight She is without leukocytosis; WBC 9.3K Renal function is normal; sCr - 0.79; UO - 200 ccs + unmeasured No significant electrolyte derangements  She is on full liquids; but not eating well  Vital signs in last 24 hours: [min-max] current  Temp:  [97.5 F (36.4 C)-98 F (36.7 C)] 97.5 F (36.4 C) (09/28 0739) Pulse Rate:  [66-88] 66 (09/28 0739) Resp:  [12-20] 18 (09/28 0739) BP: (122-161)/(54-83) 138/83 (09/28 0739) SpO2:  [94 %-100 %] 94 % (09/28 0739) Weight:  [76.2 kg] 76.2 kg (09/27 1103)     Height: 5\' 3"  (160 cm) Weight: 76.2 kg BMI (Calculated): 29.77   Intake/Output last 2 shifts:  09/27 0701 - 09/28 0700 In: 2724 [P.O.:240; I.V.:2034; IV Piggyback:450] Out: 225 [Urine:200; Blood:25]   Physical Exam:  Constitutional: alert, cooperative and no distress  Respiratory: breathing non-labored at rest, she does have some audible upper airway sounds Cardiovascular: regular rate and sinus rhythm  Gastrointestinal: soft, incisional soreness, and non-distended, no rebound/guarding Integumentary: Laparoscopic incisions are CDI with dermabond, no erythema or drainage   Labs:  CBC Latest Ref Rng & Units 02/23/2021 02/22/2021 01/18/2021  WBC 4.0 - 10.5 K/uL 9.3 5.4 6.5  Hemoglobin 12.0 - 15.0 g/dL 11.6(L) 13.0 13.6  Hematocrit 36.0 - 46.0 % 33.0(L) 38.2 39.8  Platelets 150 - 400 K/uL 142(L) 137(L) 189   CMP Latest Ref Rng & Units 02/23/2021 02/22/2021 01/18/2021  Glucose 70 - 99 mg/dL 144(H) - 104(H)  BUN 8 - 23 mg/dL 14 - 18  Creatinine 0.44 - 1.00 mg/dL 0.79 0.84  0.76  Sodium 135 - 145 mmol/L 133(L) - 138  Potassium 3.5 - 5.1 mmol/L 3.5 - 3.8  Chloride 98 - 111 mmol/L 99 - 98  CO2 22 - 32 mmol/L 25 - 25  Calcium 8.9 - 10.3 mg/dL 8.8(L) - 9.5  Total Protein 6.0 - 8.5 g/dL - - 6.5  Total Bilirubin 0.0 - 1.2 mg/dL - - 0.4  Alkaline Phos 44 - 121 IU/L - - 89  AST 0 - 40 IU/L - - 17  ALT 0 - 32 IU/L - - 16    Imaging studies: No new pertinent imaging studies   Assessment/Plan:  75 y.o. female 1 Day Post-Op s/p robotic assisted laparoscopic paraesophageal hernia repair with Nissen fundoplication.   - Continue full liquid diet; reviewed Nissen diet recommendations and provided handout - Wean from IVF - Monitor abdominal examination; on-going bowel function - Pain control prn; antiemetics prn   - Encouraged mobilization   - Continue home medications   - Discharge Planning; She is requesting additional day in the hospital, which is reasonable given her pain this morning. Will continue to reassess. Anticipate DC in the morning    All of the above findings and recommendations were discussed with the patient, and the medical team, and all of patient's questions were answered to her expressed satisfaction.  -- Edison Simon, PA-C [facility] 02/23/2021, 8:02 AM [phone] M-F: 7am - 4pm

## 2021-02-23 NOTE — Progress Notes (Signed)
Patient complaining of her left arm jerking.  I did not notice anything.  Vital signs within normal units. Was able to be assisted with a walker to the bathroom.  Ate well. Called and reported to Dr. Dahlia Byes who said to continue monitoring and report to him any new developments.

## 2021-02-23 NOTE — Plan of Care (Signed)
  Problem: Clinical Measurements: Goal: Ability to maintain clinical measurements within normal limits will improve Outcome: Progressing Goal: Will remain free from infection Outcome: Progressing Goal: Diagnostic test results will improve Outcome: Progressing Goal: Respiratory complications will improve Outcome: Progressing Goal: Cardiovascular complication will be avoided Outcome: Progressing   Problem: Pain Managment: Goal: General experience of comfort will improve Outcome: Progressing   Pt is involved in and agrees with the plan of care. V/S stable. Reports surgical pain on her abdomen; scheduled Toradol given. Refused tylenol for now, pt complaining of sore throat. Dyspnea on exertion noted.

## 2021-02-24 MED ORDER — IBUPROFEN 400 MG PO TABS: 400.0000 mg | ORAL_TABLET | Freq: Four times a day (QID) | ORAL | 0 refills | Status: DC | PRN

## 2021-02-24 MED ORDER — TRAMADOL HCL 50 MG PO TABS: 50.0000 mg | ORAL_TABLET | Freq: Four times a day (QID) | ORAL | 0 refills | Status: DC | PRN

## 2021-02-24 MED ORDER — CYCLOBENZAPRINE HCL 5 MG PO TABS: 5.0000 mg | ORAL_TABLET | Freq: Three times a day (TID) | ORAL | 0 refills | Status: DC

## 2021-02-24 MED ORDER — PREGABALIN 100 MG PO CAPS: 100.0000 mg | ORAL_CAPSULE | Freq: Three times a day (TID) | ORAL | 0 refills | Status: DC

## 2021-02-24 NOTE — Progress Notes (Signed)
   02/24/21 0915  Clinical Encounter Type  Visited With Patient  Visit Type Initial;Spiritual support;Social support  Referral From Chaplain  Consult/Referral To [employer] offered emotional and spiritual support. Chaplain made space for expression of emotions and storytelling. PT spoke of the wonderful care she had receive and excitement that she would be going home.   Andee Poles, M. Div.

## 2021-02-24 NOTE — Discharge Summary (Signed)
[facility] SURGICAL DISCHARGE SUMMARY  Patient ID: [name] [name] MRN: [id number] DOB/AGE: [birth year] 75 y.o.  Admit date: 02/22/2021 Discharge date: 02/24/2021  Discharge Diagnoses Patient Active Problem List   Diagnosis Date Noted   S/P repair of paraesophageal hernia 02/22/2021    Consultants None  Procedures 02/22/2021:  Robotic assisted laparoscopic paraesophageal hernia repair with Nissen Fundoplication  HPI: [name] [name] is a 75 y.o. female with history of symptomatic paraesophageal hernia who presents to [facility] on 09/27 for scheduled repair.   Hospital Course: Informed consent was obtained and documented, and patient underwent uneventful Robotic assisted laparoscopic paraesophageal hernia repair with Nissen Fundoplication (Dr Dahlia Byes, 85/27/7824).  Post-operatively, patient stayed one night for pan control. Advancement of patient's diet and ambulation were well-tolerated. The remainder of patient's hospital course was essentially unremarkable, and discharge planning was initiated accordingly with patient safely able to be discharged home with appropriate discharge instructions, pain control, and outpatient follow-up after all of her questions were answered to her expressed satisfaction.   Discharge Condition: Good   Physical Examination:  Constitutional: alert, cooperative and no distress  Respiratory: breathing non-labored at rest, she does have some audible upper airway sounds Cardiovascular: regular rate and sinus rhythm  Gastrointestinal: soft, incisional soreness, and non-distended, no rebound/guarding Integumentary: Laparoscopic incisions are CDI with dermabond, no erythema or drainage    Allergies as of 02/24/2021       Reactions   Meperidine Anaphylaxis, Other (See Comments)   Blood pressure dropped, also   Influenza Vaccines Other (See Comments)   Flu-like symptoms   Other Rash, Other (See Comments)   asparagus        Medication List      TAKE these medications    albuterol 108 (90 Base) MCG/ACT inhaler Commonly known as: VENTOLIN HFA INHALE 2 PUFFS INTO THE LUNGS EVERY 6 HOURS AS NEEDED FOR WHEEZING OR SHORTNESS OF BREATH   albuterol (2.5 MG/3ML) 0.083% nebulizer solution Commonly known as: PROVENTIL Take 3 mLs (2.5 mg total) by nebulization every 6 (six) hours as needed for wheezing or shortness of breath.   aspirin EC 81 MG tablet Take 1 tablet (81 mg total) by mouth daily. Swallow whole.   Budeson-Glycopyrrol-Formoterol 160-9-4.8 MCG/ACT Aero Inhale 2 puffs into the lungs in the morning and at bedtime. What changed:  when to take this reasons to take this   buPROPion 150 MG 24 hr tablet Commonly known as: WELLBUTRIN XL TAKE 1 TABLET(150 MG) BY MOUTH DAILY   cloNIDine 0.1 MG tablet Commonly known as: CATAPRES Take 1 tablet (0.1 mg total) by mouth 2 (two) times daily.   cyclobenzaprine 5 MG tablet Commonly known as: FLEXERIL Take 1 tablet (5 mg total) by mouth 3 (three) times daily.   diazepam 5 MG tablet Commonly known as: VALIUM TAKE 1 TABLET BY MOUTH EVERY 12 HOURS AS NEEDED FOR ANXIETY. WATCH FOR SEDATION   diclofenac Sodium 1 % Gel Commonly known as: VOLTAREN Apply 2 g topically 4 (four) times daily. What changed:  when to take this reasons to take this   glucose blood test strip 1 each by Other route daily. Use as instructed   ibuprofen 400 MG tablet Commonly known as: ADVIL Take 1 tablet (400 mg total) by mouth every 6 (six) hours as needed for mild pain.   isosorbide mononitrate 30 MG 24 hr tablet Commonly known as: IMDUR Take 0.5 tablets (15 mg total) by mouth daily.   lisinopril-hydrochlorothiazide 20-12.5 MG tablet Commonly known as: ZESTORETIC TAKE TWO  TABLETS BY MOUTH EVERY MORNING   nitroGLYCERIN 0.4 MG SL tablet Commonly known as: NITROSTAT Place 1 tablet (0.4 mg total) under the tongue every 5 (five) minutes as needed for chest pain.   omeprazole 40 MG  capsule Commonly known as: PRILOSEC Take 1 capsule (40 mg total) by mouth daily.   OneTouch Delica Lancets 14E Misc 1 each by Does not apply route daily.   OneTouch Verio w/Device Kit 1 each by Does not apply route daily.   oxyCODONE-acetaminophen 10-325 MG tablet Commonly known as: PERCOCET Take 1 tablet by mouth every 6 (six) hours as needed for pain.   pregabalin 100 MG capsule Commonly known as: LYRICA Take 1 capsule (100 mg total) by mouth 3 (three) times daily for 14 days.   promethazine 25 MG tablet Commonly known as: PHENERGAN Take 1 tablet (25 mg total) by mouth every 6 (six) hours as needed for nausea or vomiting.   rosuvastatin 10 MG tablet Commonly known as: CRESTOR Take 1 tablet (10 mg total) by mouth daily.   traMADol 50 MG tablet Commonly known as: ULTRAM Take 1 tablet (50 mg total) by mouth every 6 (six) hours as needed (mild pain).   traZODone 100 MG tablet Commonly known as: DESYREL Take 1 tablet (100 mg total) by mouth at bedtime.   Vitamin D (Ergocalciferol) 1.25 MG (50000 UNIT) Caps capsule Commonly known as: DRISDOL TAKE ONE CAPSULE BY MOUTH every SEVEN DAYS          Follow-up Information     P[place]on, [place] F, MD. Schedule an appointment as soon as possible for a visit in 2 week(s).   Specialty: General Surgery Why: s/p laparoscopic hiatal hernia repair Contact information: [address] [place] [address] [phone]                  Time spent on discharge management including discussion of hospital course, clinical condition, outpatient instructions, prescriptions, and follow up with the patient and members of the medical team: >30 minutes  -- Edison Simon , PA-C [facility]  02/24/2021, 8:43 AM [phone] M-F: 7am - 4pm

## 2021-02-28 DIAGNOSIS — M5412 Radiculopathy, cervical region: Secondary | ICD-10-CM | POA: Diagnosis not present

## 2021-02-28 DIAGNOSIS — Z79899 Other long term (current) drug therapy: Secondary | ICD-10-CM | POA: Diagnosis not present

## 2021-03-01 ENCOUNTER — Telehealth: Payer: Self-pay

## 2021-03-01 ENCOUNTER — Ambulatory Visit (INDEPENDENT_AMBULATORY_CARE_PROVIDER_SITE_OTHER): Payer: Medicare Other

## 2021-03-01 NOTE — Telephone Encounter (Signed)
Transition Care Management Follow-up Telephone Call Date of discharge and from where: 02/24/21  [facility] How have you been since you were released from the hospital? "Still weak" Any questions or concerns? No  Items Reviewed: Did the pt receive and understand the discharge instructions provided? Yes  Medications obtained and verified? Yes  Other? Yes  Any new allergies since your discharge? No  Dietary orders reviewed? Yes Do you have support at home? Yes   Home Care and Equipment/Supplies: Were home health services ordered? no If so, what is the name of the agency? Not applicable  Has the agency set up a time to come to the patient's home? not applicable Were any new equipment or medical supplies ordered?  No What is the name of the medical supply agency? Not applicable Were you able to get the supplies/equipment? not applicable Do you have any questions related to the use of the equipment or supplies? No  Functional Questionnaire: (I = Independent and D = Dependent) ADLs: I  Bathing/Dressing- I  Meal Prep- I  Eating- I  Maintaining continence- I  Transferring/Ambulation- I  Managing Meds- I  Follow up appointments reviewed:  PCP Hospital f/u appt confirmed? No  will see specialist.  [facility] f/u appt confirmed? Yes  Scheduled to see Dr. Caroleen Hamman on 03/16/21 @ 4:30pm. Are transportation arrangements needed? No  If their condition worsens, is the pt aware to call PCP or go to the Emergency Dept.? Yes Was the patient provided with contact information for the PCP's office or ED? Yes Was to pt encouraged to call back with questions or concerns? Yes    Thea Silversmith, RN, MSN, BSN, [place] Care Management Coordinator [phone]

## 2021-03-01 NOTE — Patient Instructions (Signed)
Visit Information   Goals Addressed   None    Patient Care Plan: CCM Pharmacy Care Plan     Problem Identified: htn, hld, copd   Priority: High  Onset Date: 09/03/2020     Long-Range Goal: Disease Management   Start Date: 09/03/2020  Expected End Date: 09/03/2021  Recent Progress: On track  Priority: High  Note:    Interventions: 1:1 collaboration with Lillard Anes, MD regarding development and update of comprehensive plan of care as evidenced by provider attestation and co-signature Inter-disciplinary care team collaboration (see longitudinal plan of care) Comprehensive medication review performed; medication list updated in electronic medical record  Hypertension (BP goal <140/90) -Controlled -Current treatment: lisinopril-hydrochlorothiazide 20-12.5 mg 2 tablets daily  Clonidine 0.1 mg bid -Medications previously tried:  amlodipine, hydrochlorothiazide, furosemide,  -Current home readings:  has been good at home. Typically checks weekly.  -Current dietary habits: trying to eat heart healthy and lower sodium.  -Current exercise habits:  has done home PT before. She knows how to do chair exercises and does them at home 1-2 times daily.  -Denies hypotensive/hypertensive symptoms -Educated on BP goals and benefits of medications for prevention of heart attack, stroke and kidney damage; Daily salt intake goal < 2300 mg; Exercise goal of 150 minutes per week; Importance of home blood pressure monitoring; -Counseled to monitor BP at home weekly, document, and provide log at future appointments -Counseled on diet and exercise extensively Recommended to continue current medication Recommended checking blood pressure weekly and recording.   Hyperlipidemia: (LDL goal < 100) The 10-year ASCVD risk score (Arnett DK, et al., 2019) is: 62.9%   Values used to calculate the score:     Age: 75 years     Sex: Female     Is Non-Hispanic African American: No     Diabetic: Yes      Tobacco smoker: Yes     Systolic Blood Pressure: 884 mmHg     Is BP treated: Yes     HDL Cholesterol: 61 mg/dL     Total Cholesterol: 152 mg/dL Lab Results  Component Value Date   CHOL 152 01/18/2021   CHOL 167 07/14/2020   CHOL 160 08/11/2019   Lab Results  Component Value Date   HDL 61 01/18/2021   HDL 57 07/14/2020   HDL 65 08/11/2019   Lab Results  Component Value Date   LDLCALC 80 01/18/2021   LDLCALC 100 (H) 07/14/2020   LDLCALC 81 08/11/2019   Lab Results  Component Value Date   TRIG 53 01/18/2021   TRIG 51 07/14/2020   TRIG 74 08/11/2019   Lab Results  Component Value Date   CHOLHDL 2.5 01/18/2021   CHOLHDL 2.9 07/14/2020   CHOLHDL 2.5 08/11/2019  No results found for: LDLDIRECT -Controlled -Current treatment: Rosuvastatin 10 mg daily  -Medications previously tried: lovastatin, atorvastatin -Current dietary patterns:  trying to abide by heart healthy and lower sodium. Doesn't eat breakfast. [place] with fruit for lunch. Drinking Ensure. Eats oatmeal. Frozen chicken nuggets and fries.  -Current exercise habits: chair exercises 1-2 times daily  -Educated on Cholesterol goals;  Benefits of statin for ASCVD risk reduction; Importance of limiting foods high in cholesterol; Exercise goal of 150 minutes per week; -Counseled on diet and exercise extensively September 2022: Candidate for higher statin with risk factors  COPD (Goal: control symptoms and prevent exacerbations) -Not ideally controlled -Current treatment  albuterol nebulizer solution 3 mls every 6 hours prn wheezing or shortness of breath Albuterol  inhaler 2 puffs into lungs every 6 hours prn wheezing/shortness of breath Breztri 160/9/4.8 mcg/act 2 puffs into the lungs am and bedtime  -Medications previously tried: none reported  -Pulmonary function testing: ordered with pulmonology for 10/2020 -Exacerbations requiring treatment in last 6 months: yes -Patient reports consistent use of  maintenance inhaler -Frequency of rescue inhaler use: daily with exertion -Counseled on Proper inhaler technique; Benefits of consistent maintenance inhaler use smoking cessation.  -Counseled on diet and exercise extensively Recommended to continue current medication Counseled on smoking cessation. Smoking 1.5 packs per day.   Depression/Anxiety (Goal: manage symptoms of depression and anxiety) -Controlled -Current treatment: bupropion xl 150 mg daily  Diazepam 5 mg every 12 hours prn anxiety Trazodone 100 mg daily at bedtime  -Medications previously tried/failed: Savella, sertraline, trazodone Depression screen [facility] 2/9 01/18/2021 10/12/2020 09/03/2020  Decreased Interest 2 2 1   Down, Depressed, Hopeless 3 2 2   PHQ - 2 Score 5 4 3   Altered sleeping 3 3 2   Tired, decreased energy 3 3 2   Change in appetite 1 0 2  Feeling bad or failure about yourself  0 0 2  Trouble concentrating 3 3 1   Moving slowly or fidgety/restless 2 2 1   Suicidal thoughts 0 0 0  PHQ-9 Score 17 15 13   Difficult doing work/chores Somewhat difficult Not difficult at all Very difficult   GAD 7 : Generalized Anxiety Score 09/03/2020  Nervous, Anxious, on Edge 2  Control/stop worrying 2  Worry too much - different things 2  Trouble relaxing 1  Restless 1  Easily annoyed or irritable 1  Afraid - awful might happen 1  Total GAD 7 Score 10  Anxiety Difficulty Somewhat difficult  -Educated on Benefits of medication for symptom control -Counseled on diet and exercise extensively September 2022: Will ask PCP about increasing therapy  GERD (Goal: manage symptoms of GERD) -Not ideally controlled -Current treatment  Omeprazole 40 mg daily Promethazine 25 mg every 6 hours prn nausea/vomiting -Medications previously tried:  Linzess, metoclopramide,  -Counseled on diet and exercise extensively Recommended to continue current medication September 2022: Just had Hiatal Hernia Sx, defer to specialist  Tobacco use (Goal  reduce cigarette use) -Uncontrolled -Previous quit attempts: none reported -Current treatment  Patient take bupropion for depression and is not ready to quit at this time  -Patient smokes Within 30 minutes of waking -Patient triggers include: stress, anxiety and sadness and craving the taste of a cigarette and needing to do something with your hands/mouth -On a scale of 1-10, reports MOTIVATION to quit is 0 -On a scale of 1-10, reports CONFIDENCE in quitting is 0 -Provided contact information for [place] [facility]uit [facility] (1-800-QUIT-NOW) and encouraged patient to reach out to this group for support. -Educated on options when patient is ready. Patient hopes to maybe begin with reducing from 1.5 packs per day. She is not ready to begin cessation at this time and will contact office when she is ready.   Osteoporosis / Osteopenia (Goal minimize risk of breaks or fractures) -Last DEXA Scan:  has had one a long time ago. Needs updated scan completed within [place] to allow for transportation. .    -Current treatment  Diet  -Medications previously tried:  vitamin d -Recommend [phone] units of vitamin D daily. Recommend 1200 mg of calcium daily from dietary and supplemental sources. Recommend weight-bearing and muscle strengthening exercises for building and maintaining bone density. -Counseled on diet and exercise extensively Recommended updated Dexa scan and Vitamin D level drawn  with next labs.   Pain sees Dr. Nelva Bush (Goal: manage back pain) -Controlled -Current treatment  oxycodone-acetaminophen 10/325 mg every 6 hours prn for pain  -Medications previously tried:  celecoxib, ibuprofen, diclofenac gel  -Counseled on diet and exercise extensively Recommended continuing to follow-up with Dr. Nelva Bush.   Health Maintenance -Vaccine gaps: COVID vaccine (patient declines) Tetanus recommended if patient hasn't had in last 10 years. Patient reports having [place] shots.  -Current therapy:   nitroglycerin 0.4 mg sl under the tongue every 5 minuts prn chest pain  -Patient is satisfied with current therapy and denies issues -Counseled on diet and exercise extensively Recommended to continue current medication   Patient Goals/Self-Care Activities Patient will:  - take medications as prescribed focus on medication adherence by using packaing and delivery through [employer].  check blood pressure weekly, document, and provide at future appointments target a minimum of 150 minutes of moderate intensity exercise weekly engage in dietary modifications by heart-healthy, low-sodium diet.   Follow Up Plan: Telephone follow up appointment with care management team member scheduled [id number]      The patient verbalized understanding of instructions, educational materials, and care plan provided today and declined offer to receive copy of patient instructions, educational materials, and care plan.  The pharmacy team will reach out to the patient again over the next 30 days.   Lane Hacker, [facility]

## 2021-03-01 NOTE — Progress Notes (Signed)
Chronic Care Management Pharmacy Note  03/01/2021 Name:  [name] [name] MRN:  [id number] DOB:  [birth year]   Plan Recommendations:  Please consider rechecking vitamin D with next labs. Patient states she completed high dose vitamin D regimen but has not continued to supplement.  Patient has no transportation and is non-compliant on meds. Could you please send all her meds that you Rx to [facility]. I made a med sync list calendar that can be found at the bottom of this note Patient's mental health is not controlled. Bupropion 170m isn't helping for her, could we send in something stronger for the patient? Could we consider increasing her statin in the future due to her risk factors  Subjective: [name] RAGANis an 75y.o. year old female who is a primary patient of PHenrene Pastor LZeb Comfort MD.  The CCM team was consulted for assistance with disease management and care coordination needs.    Engaged with patient by telephone for follow up visit in response to provider referral for pharmacy case management and/or care coordination services.   Consent to Services:  The patient was given the following information about Chronic Care Management services today, agreed to services, and gave verbal consent: 1. CCM service includes personalized support from designated clinical staff supervised by the primary care provider, including individualized plan of care and coordination with other care providers 2. 24/7 contact phone numbers for assistance for urgent and routine care needs. 3. Service will only be billed when office clinical staff spend 20 minutes or more in a month to coordinate care. 4. Only one practitioner may furnish and bill the service in a calendar month. 5.The patient may stop CCM services at any time (effective at the end of the month) by phone call to the office staff. 6. The patient will be responsible for cost sharing (co-pay) of up to 20% of the service fee (after annual deductible  is met). Patient agreed to services and consent obtained.  Patient Care Team: PLillard Anes MD as PCP - General (Family Medicine) TBerniece Salines DO as PCP - Cardiology (Cardiology) [facility]urnice Logan [facility] as Pharmacist (Pharmacist) KLane Hacker [facility] (Pharmacist)  Recent office visits: 07/14/2020 - CBC normal, kidney and liver tests normal, A1c 5.1 OK, LDL cholesterol 100.  04/15/2020 - depression and grief.  03/30/2020 - gastroenteritis - promethazine and depression.  Recent consult visits: 08/04/2020 - cardiology - symptoms persist recommend proceeding with Coronary CTA. Smoking cessation available.  08/02/2020 - Pulmonology - full set of PFTs at follow-up. Continue Breztri and albuterol.  Hospital visits:  07/22/2020 - ed to hospital admission for chest pain.  03/20/2020 - ED visit    Objective:  Lab Results  Component Value Date   CREATININE 0.79 02/23/2021   BUN 14 02/23/2021   GFRNONAA >60 02/23/2021   GFRAA 100 07/14/2020   NA 133 (L) 02/23/2021   K 3.5 02/23/2021   CALCIUM 8.8 (L) 02/23/2021   CO2 25 02/23/2021   GLUCOSE 144 (H) 02/23/2021    Lab Results  Component Value Date/Time   HGBA1C 5.1 07/14/2020 02:42 PM   HGBA1C 5.1 08/11/2019 10:44 AM    Last diabetic Eye exam: No results found for: HMDIABEYEEXA  Last diabetic Foot exam: No results found for: HMDIABFOOTEX   Lab Results  Component Value Date   CHOL 152 01/18/2021   HDL 61 01/18/2021   LDLCALC 80 01/18/2021   TRIG 53 01/18/2021   CHOLHDL 2.5 01/18/2021  Hepatic Function Latest Ref Rng & Units 01/18/2021 07/22/2020 07/14/2020  Total Protein 6.0 - 8.5 g/dL 6.5 6.2(L) 6.6  Albumin 3.7 - 4.7 g/dL 4.3 3.6 4.1  AST 0 - 40 IU/L _0 ALT 0 - 32 IU/L _1 Alk Phosphatase 44 - 121 IU/L 89 64 95  Total Bilirubin 0.0 - 1.2 mg/dL 0.4 0.6 0.4    No results found for: TSH, FREET4  CBC Latest Ref Rng & Units 02/23/2021 02/22/2021 01/18/2021  WBC 4.0 - 10.5 K/uL  9.3 5.4 6.5  Hemoglobin 12.0 - 15.0 g/dL 11.6(L) 13.0 13.6  Hematocrit 36.0 - 46.0 % 33.0(L) 38.2 39.8  Platelets 150 - 400 K/uL 142(L) 137(L) 189    Lab Results  Component Value Date/Time   VD25OH 17.3 (L) 12/10/2019 03:34 PM    Clinical ASCVD: No  The 10-year ASCVD risk score (Arnett DK, et al., 2019) is: 62.9%   Values used to calculate the score:     Age: 75 years     Sex: Female     Is Non-Hispanic African American: No     Diabetic: Yes     Tobacco smoker: Yes     Systolic Blood Pressure: 680 mmHg     Is BP treated: Yes     HDL Cholesterol: 61 mg/dL     Total Cholesterol: 152 mg/dL    Depression screen [facility] 2/9 01/18/2021 10/12/2020 09/03/2020  Decreased Interest _2 Down, Depressed, Hopeless _3 PHQ - 2 Score _4 Altered sleeping _5 Tired, decreased energy _6 Change in appetite 1 0 2  Feeling bad or failure about yourself  0 0 2  Trouble concentrating _7 Moving slowly or fidgety/restless _8 Suicidal thoughts 0 0 0  PHQ-9 Score _9 Difficult doing work/chores Somewhat difficult Not difficult at all Very difficult     Social History   Tobacco Use  Smoking Status Every Day   Packs/day: 1.00   Years: 47.00   Pack years: 47.00   Types: Cigarettes  Smokeless Tobacco Never  Tobacco Comments   Smokes 1 pack daily 01/04/21 ARJ    BP Readings from Last 3 Encounters:  02/24/21 (!) 157/64  02/02/21 (!) 177/105  01/18/21 132/80   Pulse Readings from Last 3 Encounters:  02/24/21 77  01/18/21 70  01/04/21 72   Wt Readings from Last 3 Encounters:  02/22/21 167 lb 15.9 oz (76.2 kg)  02/11/21 168 lb (76.2 kg)  02/02/21 170 lb 6.4 oz (77.3 kg)   BMI Readings from Last 3 Encounters:  02/22/21 29.76 kg/m  02/11/21 29.76 kg/m  02/02/21 30.19 kg/m    Assessment/Interventions: Review of patient past medical history, allergies, medications, health status, including review of consultants reports, laboratory and other test data, was  performed as part of comprehensive evaluation and provision of chronic care management services.   SDOH:  (Social Determinants of Health) assessments and interventions performed: Yes SDOH Interventions    Flowsheet Row Most Recent Value  SDOH Interventions   Transportation Interventions Other (Comment)  [Will deliver meds]      SDOH Screenings   Alcohol Screen: Low Risk    Last Alcohol Screening Score (AUDIT): 0  Depression (PHQ2-9): Medium Risk   PHQ-2 Score: 17  Financial Resource Strain: Low Risk    Difficulty of Paying Living Expenses: Not hard at all  Food Insecurity: No Food Insecurity  Worried About Charity fundraiser in the Last Year: Never true   Ran Out of Food in the Last Year: Never true  Housing: Low Risk    Last Housing Risk Score: 0  Physical Activity: Insufficiently Active   Days of Exercise per Week: 2 days   Minutes of Exercise per Session: 20 min  Social Connections: Socially Isolated   Frequency of Communication with Friends and Family: Three times a week   Frequency of Social Gatherings with Friends and Family: Never   Attends Religious Services: Never   Marine scientist or Organizations: No   Attends Archivist Meetings: Never   Marital Status: Widowed  Stress: Stress Concern Present   Feeling of Stress : To some extent  Tobacco Use: High Risk   Smoking Tobacco Use: Every Day   Smokeless Tobacco Use: Never  Transportation Needs: Unmet Transportation Needs   Lack of Transportation (Medical): Yes   Lack of Transportation (Non-Medical): Yes    CCM Care Plan  Allergies  Allergen Reactions   Meperidine Anaphylaxis and Other (See Comments)    Blood pressure dropped, also   Influenza Vaccines Other (See Comments)    Flu-like symptoms   Other Rash and Other (See Comments)    asparagus    Medications Reviewed Today     Reviewed by Lane Hacker, [facility] (Pharmacist) on 03/01/21 at 1133  Med List Status: <None>   Medication  Order Taking? Sig Documenting Provider Last Dose Status Informant  albuterol (PROVENTIL) (2.5 MG/3ML) 0.083% nebulizer solution [id number]  Take 3 mLs (2.5 mg total) by nebulization every 6 (six) hours as needed for wheezing or shortness of breath. Spero Geralds, MD  Active Self  albuterol (VENTOLIN HFA) 108 (90 Base) MCG/ACT inhaler [id number]  INHALE 2 PUFFS INTO THE LUNGS EVERY 6 HOURS AS NEEDED FOR WHEEZING OR SHORTNESS OF Edwyna Ready, MD  Active Self  aspirin EC 81 MG tablet [id number] Yes Take 1 tablet (81 mg total) by mouth daily. Swallow whole. Tobb, Godfrey Pick, DO Taking Active Self  Blood Glucose Monitoring Suppl (ONETOUCH VERIO) w/Device KIT [id number]  1 each by Does not apply route daily. Lillard Anes, MD  Active Self  Budeson-Glycopyrrol-Formoterol 160-9-4.8 MCG/ACT Hollie Salk [id number]  Inhale 2 puffs into the lungs in the morning and at bedtime.  Patient taking differently: Inhale 2 puffs into the lungs 2 (two) times daily as needed (shortness of breath).   Lillard Anes, MD  Active Self           Med Note Lacinda Axon, CANDICE   Thu Nov 18, 2020  1:03 PM)    buPROPion (WELLBUTRIN XL) 150 MG 24 hr tablet [id number] Yes TAKE 1 TABLET(150 MG) BY MOUTH DAILY Lillard Anes, MD Taking Active Self  cloNIDine (CATAPRES) 0.1 MG tablet [id number] Yes Take 1 tablet (0.1 mg total) by mouth 2 (two) times daily. Lillard Anes, MD Taking Active Self  cyclobenzaprine (FLEXERIL) 5 MG tablet [id number]  Take 1 tablet (5 mg total) by mouth 3 (three) times daily. Tylene Fantasia, PA-C  Active   diazepam (VALIUM) 5 MG tablet [id number]  TAKE 1 TABLET BY MOUTH EVERY 12 HOURS AS NEEDED FOR ANXIETY. WATCH FOR SEDATION Lillard Anes, MD  Active Self  diclofenac Sodium (VOLTAREN) 1 % GEL [id number]  Apply 2 g topically 4 (four) times daily.  Patient taking differently: Apply 2 g topically 4 (four) times daily as needed (pain).   Geradine Girt, DO  Active Self   glucose blood test strip [id number]  1 each by Other route daily. Use as instructed Lillard Anes, MD  Active Self  ibuprofen (ADVIL) 400 MG tablet [id number]  Take 1 tablet (400 mg total) by mouth every 6 (six) hours as needed for mild pain. Tylene Fantasia, PA-C  Active   isosorbide mononitrate (IMDUR) 30 MG 24 hr tablet [id number]  Take 0.5 tablets (15 mg total) by mouth daily.  Patient not taking: Reported on 02/10/2021   Berniece Salines, DO  Expired 02/16/21 2359 Self  lisinopril-hydrochlorothiazide (ZESTORETIC) 20-12.5 MG tablet [id number] Yes TAKE TWO TABLETS BY MOUTH EVERY MORNING Lillard Anes, MD Taking Active Self  nitroGLYCERIN (NITROSTAT) 0.4 MG SL tablet [id number]  Place 1 tablet (0.4 mg total) under the tongue every 5 (five) minutes as needed for chest pain. Tobb, Kardie, DO  Expired 02/11/21 2359 Self  omeprazole (PRILOSEC) 40 MG capsule [id number]  Take 1 capsule (40 mg total) by mouth daily. Lillard Anes, MD  Active Self  OneTouch Delica Lancets 75F [place] [id number]  1 each by Does not apply route daily. Lillard Anes, MD  Active Self  oxyCODONE-acetaminophen (PERCOCET) 10-325 MG tablet [id number]  Take 1 tablet by mouth every 6 (six) hours as needed for pain. [provider]  Active Self  pregabalin (LYRICA) 100 MG capsule [id number]  Take 1 capsule (100 mg total) by mouth 3 (three) times daily for 14 days. Tylene Fantasia, PA-C  Active   promethazine (PHENERGAN) 25 MG tablet [id number] Yes Take 1 tablet (25 mg total) by mouth every 6 (six) hours as needed for nausea or vomiting. Lillard Anes, MD Taking Active Self  rosuvastatin (CRESTOR) 10 MG tablet [id number]  Take 1 tablet (10 mg total) by mouth daily. Tobb, Kardie, DO  Expired 02/10/21 2359 Self  traMADol (ULTRAM) 50 MG tablet [id number]  Take 1 tablet (50 mg total) by mouth every 6 (six) hours as needed (mild pain). Tylene Fantasia, PA-C  Active   traZODone (DESYREL) 100 MG tablet  [id number]  Take 1 tablet (100 mg total) by mouth at bedtime. Lillard Anes, MD  Active Self  Vitamin D, Ergocalciferol, (DRISDOL) 1.25 MG (50000 UNIT) CAPS capsule [id number]  TAKE ONE CAPSULE BY MOUTH every SEVEN DAYS Lillard Anes, MD  Active Self            Patient Active Problem List   Diagnosis Date Noted   S/P repair of paraesophageal hernia 02/22/2021   Coronary artery disease involving native coronary artery of native heart without angina pectoris 11/18/2020   Cataract 11/16/2020   GERD (gastroesophageal reflux disease) 11/16/2020   Osteoarthritis    Enlarged aorta (HCC)    COPD with acute bronchitis ([place]) 07/22/2020   Syncope    Acute gastroenteritis 03/30/2020   Depression, major, single episode, severe ([place]) 03/30/2020   PAC (premature atrial contraction) 02/03/2020   PAT (paroxysmal atrial tachycardia) ([place]) 02/03/2020   Routine general medical examination at a health care facility 63/84/5364   Diastolic dysfunction 68/07/2120   Chest pain 08/19/2019   SOB (shortness of breath) 08/19/2019   Obesity (BMI 30-39.9) 08/19/2019   Tobacco use 08/19/2019   Type 2 diabetes mellitus with other specified complication ([place]) 48/25/0037   Hyperlipidemia 08/07/2019   Essential hypertension 08/07/2019   COPD (chronic obstructive pulmonary disease) ([place]) 08/07/2019   GERD with esophagitis 08/07/2019   Chest pain at rest 08/07/2019   Degenerative joint disease involving multiple joints 09/28/2017   Long-term  current use of opiate analgesic 06/25/2017   Pain of right hip joint 11/22/2015   Chronic left shoulder pain 09/27/2015   S/P total knee arthroplasty 02/15/2015   Primary osteoarthritis of left knee 02/04/2015    Immunization History  Administered Date(s) Administered   Fluad Quad(high Dose 65+) 03/30/2020   Influenza, High Dose Seasonal PF 03/28/2018    Conditions to be addressed/monitored:  Hypertension, Hyperlipidemia, Diabetes, GERD, COPD and  Depression  Care Plan : [place]  Updates made by Lane Hacker, RPH since 03/01/2021 12:00 AM     Problem: htn, hld, copd   Priority: High  Onset Date: 09/03/2020     Long-Range Goal: Disease Management   Start Date: 09/03/2020  Expected End Date: 09/03/2021  Recent Progress: On track  Priority: High  Note:    Interventions: 1:1 collaboration with Lillard Anes, MD regarding development and update of comprehensive plan of care as evidenced by provider attestation and co-signature Inter-disciplinary care team collaboration (see longitudinal plan of care) Comprehensive medication review performed; medication list updated in electronic medical record  Hypertension (BP goal <140/90) -Controlled -Current treatment: lisinopril-hydrochlorothiazide 20-12.5 mg 2 tablets daily  Clonidine 0.1 mg bid -Medications previously tried:  amlodipine, hydrochlorothiazide, furosemide,  -Current home readings:  has been good at home. Typically checks weekly.  -Current dietary habits: trying to eat heart healthy and lower sodium.  -Current exercise habits:  has done home PT before. She knows how to do chair exercises and does them at home 1-2 times daily.  -Denies hypotensive/hypertensive symptoms -Educated on BP goals and benefits of medications for prevention of heart attack, stroke and kidney damage; Daily salt intake goal < 2300 mg; Exercise goal of 150 minutes per week; Importance of home blood pressure monitoring; -Counseled to monitor BP at home weekly, document, and provide log at future appointments -Counseled on diet and exercise extensively Recommended to continue current medication Recommended checking blood pressure weekly and recording.   Hyperlipidemia: (LDL goal < 100) The 10-year ASCVD risk score (Arnett DK, et al., 2019) is: 62.9%   Values used to calculate the score:     Age: 1 years     Sex: Female     Is Non-Hispanic African American: No      Diabetic: Yes     Tobacco smoker: Yes     Systolic Blood Pressure: 009 mmHg     Is BP treated: Yes     HDL Cholesterol: 61 mg/dL     Total Cholesterol: 152 mg/dL Lab Results  Component Value Date   CHOL 152 01/18/2021   CHOL 167 07/14/2020   CHOL 160 08/11/2019   Lab Results  Component Value Date   HDL 61 01/18/2021   HDL 57 07/14/2020   HDL 65 08/11/2019   Lab Results  Component Value Date   LDLCALC 80 01/18/2021   LDLCALC 100 (H) 07/14/2020   LDLCALC 81 08/11/2019   Lab Results  Component Value Date   TRIG 53 01/18/2021   TRIG 51 07/14/2020   TRIG 74 08/11/2019   Lab Results  Component Value Date   CHOLHDL 2.5 01/18/2021   CHOLHDL 2.9 07/14/2020   CHOLHDL 2.5 08/11/2019  No results found for: LDLDIRECT -Controlled -Current treatment: Rosuvastatin 10 mg daily  -Medications previously tried: lovastatin, atorvastatin -Current dietary patterns:  trying to abide by heart healthy and lower sodium. Doesn't eat breakfast. [place] with fruit for lunch. Drinking Ensure. Eats oatmeal. Frozen chicken nuggets and fries.  -Current exercise habits:  chair exercises 1-2 times daily  -Educated on Cholesterol goals;  Benefits of statin for ASCVD risk reduction; Importance of limiting foods high in cholesterol; Exercise goal of 150 minutes per week; -Counseled on diet and exercise extensively September 2022: Candidate for higher statin with risk factors  COPD (Goal: control symptoms and prevent exacerbations) -Not ideally controlled -Current treatment  albuterol nebulizer solution 3 mls every 6 hours prn wheezing or shortness of breath Albuterol inhaler 2 puffs into lungs every 6 hours prn wheezing/shortness of breath Breztri 160/9/4.8 mcg/act 2 puffs into the lungs am and bedtime  -Medications previously tried: none reported  -Pulmonary function testing: ordered with pulmonology for 10/2020 -Exacerbations requiring treatment in last 6 months: yes -Patient reports  consistent use of maintenance inhaler -Frequency of rescue inhaler use: daily with exertion -Counseled on Proper inhaler technique; Benefits of consistent maintenance inhaler use smoking cessation.  -Counseled on diet and exercise extensively Recommended to continue current medication Counseled on smoking cessation. Smoking 1.5 packs per day.   Depression/Anxiety (Goal: manage symptoms of depression and anxiety) -Controlled -Current treatment: bupropion xl 150 mg daily  Diazepam 5 mg every 12 hours prn anxiety Trazodone 100 mg daily at bedtime  -Medications previously tried/failed: Savella, sertraline, trazodone Depression screen [facility] 2/9 01/18/2021 10/12/2020 09/03/2020  Decreased Interest _0 Down, Depressed, Hopeless _1 PHQ - 2 Score _2 Altered sleeping _3 Tired, decreased energy _4 Change in appetite 1 0 2  Feeling bad or failure about yourself  0 0 2  Trouble concentrating _5 Moving slowly or fidgety/restless _6 Suicidal thoughts 0 0 0  PHQ-9 Score _7 Difficult doing work/chores Somewhat difficult Not difficult at all Very difficult   GAD 7 : Generalized Anxiety Score 09/03/2020  Nervous, Anxious, on Edge 2  Control/stop worrying 2  Worry too much - different things 2  Trouble relaxing 1  Restless 1  Easily annoyed or irritable 1  Afraid - awful might happen 1  Total GAD 7 Score 10  Anxiety Difficulty Somewhat difficult  -Educated on Benefits of medication for symptom control -Counseled on diet and exercise extensively September 2022: Will ask PCP about increasing therapy  GERD (Goal: manage symptoms of GERD) -Not ideally controlled -Current treatment  Omeprazole 40 mg daily Promethazine 25 mg every 6 hours prn nausea/vomiting -Medications previously tried:  Linzess, metoclopramide,  -Counseled on diet and exercise extensively Recommended to continue current medication September 2022: Just had Hiatal Hernia Sx, defer to  specialist  Tobacco use (Goal reduce cigarette use) -Uncontrolled -Previous quit attempts: none reported -Current treatment  Patient take bupropion for depression and is not ready to quit at this time  -Patient smokes Within 30 minutes of waking -Patient triggers include: stress, anxiety and sadness and craving the taste of a cigarette and needing to do something with your hands/mouth -On a scale of 1-10, reports MOTIVATION to quit is 0 -On a scale of 1-10, reports CONFIDENCE in quitting is 0 -Provided contact information for [place] [facility]uit Line (1-800-QUIT-NOW) and encouraged patient to reach out to this group for support. -Educated on options when patient is ready. Patient hopes to maybe begin with reducing from 1.5 packs per day. She is not ready to begin cessation at this time and will contact office when she is ready.   Osteoporosis / Osteopenia (Goal minimize risk of breaks or fractures) -Last DEXA Scan:  has had  one a long time ago. Needs updated scan completed within [place] to allow for transportation. .    -Current treatment  Diet  -Medications previously tried:  vitamin d -Recommend [phone] units of vitamin D daily. Recommend 1200 mg of calcium daily from dietary and supplemental sources. Recommend weight-bearing and muscle strengthening exercises for building and maintaining bone density. -Counseled on diet and exercise extensively Recommended updated Dexa scan and Vitamin D level drawn with next labs.   Pain sees Dr. Nelva Bush (Goal: manage back pain) -Controlled -Current treatment  oxycodone-acetaminophen 10/325 mg every 6 hours prn for pain  -Medications previously tried:  celecoxib, ibuprofen, diclofenac gel  -Counseled on diet and exercise extensively Recommended continuing to follow-up with Dr. Nelva Bush.   Health Maintenance -Vaccine gaps: COVID vaccine (patient declines) Tetanus recommended if patient hasn't had in last 10 years. Patient reports having P[place]um[place] shots.   -Current therapy:  nitroglycerin 0.4 mg sl under the tongue every 5 minuts prn chest pain  -Patient is satisfied with current therapy and denies issues -Counseled on diet and exercise extensively Recommended to continue current medication   Patient Goals/Self-Care Activities Patient will:  - take medications as prescribed focus on medication adherence by using packaing and delivery through [facility].  check blood pressure weekly, document, and provide at future appointments target a minimum of 150 minutes of moderate intensity exercise weekly engage in dietary modifications by heart-healthy, low-sodium diet.   Follow Up Plan: Telephone follow up appointment with care management team member scheduled [id number] 2023      Medication Assistance: None required.  Patient affirms current coverage meets needs.  Patient's preferred pharmacy is:  [facility] [place], [place] - [address] AT [place] [address] [place] [address] Phone: [phone] Fax: [phone]  [facility] - [place], [place] - [address] [place] [address] Phone: [phone] Fax: [phone]  Uses pill box? Yes Pt endorses misssed doses or taking later than scheduled.   Meds: Patient was with [facility] but went back to [facility]. She stated that the person who used to drive her to the Pharmacy can no longer and she'd like to go back to [facility]. Plan: Verbal consent obtained for [facility] enhanced pharmacy services (medication synchronization, adherence packaging, delivery coordination). A medication sync plan was created to allow patient to get all medications delivered once every 30 to 90 days per patient preference. Patient understands they have freedom to choose pharmacy and clinical pharmacist will coordinate care between all prescribers and [facility].  Medication Name                         (please note if Rx is PRN) Prescriber                                                                  (list Provider Name & Phone Number)                                  Timing    Refill Timing Last Fill Date & DS       (  if last fill/DS unavailable, list pt.'s quantity on hand) Anticipated next due date    BB B L EM BT                ASA 98m PHenrene [phone] 1    Cycle Fill First packs please 04/04/21  Rosuvastatin 123mPeHenrene [phone]   1 Cycle Fill 08/31/20 for 30 days 04/04/21  Bupropion XL 15093merHenrene [id number]    Cycle Fill 01/18/21 for 90 days  04/04/21 (Unless PCP increases dose)  ISMN 71m20m Kardie Tobb [phone]    Cycle Fill 02/13/21 for 90 days 05/12/21  Clonidine 0.1mg 37mryHenrene [phone] 1 Cycle Fill 09/28/20 for 30 days 04/04/21  Lisinopril/HCTZ 20-12.5  PerryHenrene [phone]  Cycle Fill 09/29/20 for 60 days 04/04/21  Promethazine 25mg 55my Henrene [id number]    PRN 12/17/20 for 30 days 04/04/21  Trazodone 100mg P57m 3Henrene [id number] Cycle Fill 02/18/21 for 60 days 04/04/21  Oxycodone APAP 10-325 Carol KLevy [id number] Fill 03/06/21 for 30 days 04/04/21  Vitamind D Ergocalciferol 1.25mg [id number] [id number] Fill  04/04/21  Omeprazole PRN 40mg [id number] [id number] 03/07/21 for 30 days 04/04/21  Diazepam 5mg PRN P22my [id number]-Henrene [id number] Cycle Fill 01/02/21 for 30 days 04/04/21  Budesonide/Glycopyrrol/Formoterol 160/9/4.8 2 BID Perry 336-Henrene [id number] Fill Unknown 04/04/21  Albuterol puffer and neb Perry 336-Henrene [id number]     Care Plan and Follow Up Patient Decision:  Patient agrees to Care Plan and Follow-up.  Plan: Telephone follow up appointment with care management team member scheduled for:  March 2023

## 2021-03-02 ENCOUNTER — Other Ambulatory Visit: Payer: Self-pay | Admitting: Legal Medicine

## 2021-03-03 ENCOUNTER — Encounter: Payer: Self-pay | Admitting: Surgery

## 2021-03-04 ENCOUNTER — Other Ambulatory Visit: Payer: Self-pay

## 2021-03-04 MED ORDER — ASPIRIN EC 81 MG PO TBEC: 81.0000 mg | DELAYED_RELEASE_TABLET | Freq: Every day | ORAL | 3 refills | Status: AC

## 2021-03-04 MED ORDER — ROSUVASTATIN CALCIUM 10 MG PO TABS: 10.0000 mg | ORAL_TABLET | Freq: Every day | ORAL | 3 refills | Status: DC

## 2021-03-04 MED ORDER — NITROGLYCERIN 0.4 MG SL SUBL: 0.4000 mg | SUBLINGUAL_TABLET | SUBLINGUAL | 3 refills | Status: DC | PRN

## 2021-03-04 MED ORDER — ISOSORBIDE MONONITRATE ER 30 MG PO TB24: 15.0000 mg | ORAL_TABLET | Freq: Every day | ORAL | 3 refills | Status: DC

## 2021-03-04 NOTE — Telephone Encounter (Signed)
Refill sent to pharmacy.   

## 2021-03-16 ENCOUNTER — Ambulatory Visit (INDEPENDENT_AMBULATORY_CARE_PROVIDER_SITE_OTHER): Payer: Medicare Other | Admitting: Surgery

## 2021-03-16 ENCOUNTER — Encounter: Payer: Self-pay | Admitting: Surgery

## 2021-03-16 ENCOUNTER — Other Ambulatory Visit: Payer: Self-pay

## 2021-03-16 VITALS — BP 142/93 | HR 79 | Temp 98.6°F | Ht 63.0 in | Wt 161.4 lb

## 2021-03-16 DIAGNOSIS — Z09 Encounter for follow-up examination after completed treatment for conditions other than malignant neoplasm: Secondary | ICD-10-CM

## 2021-03-16 NOTE — Patient Instructions (Signed)
In a week you may start introducing more foods like ground meat. You may eat cooked vegetables and fruits.    In a couple of weeks slowly start adding small amounts of bread to your diet.   Carbonated drinks (sodas) are not allowed for the first six to eight weeks after surgery. After this time you can try them again in small amounts.  No heavy lifting for 2 more weeks.   Follow up here in 2 months.   [place] Soft Diet Food Category Foods to Choose Foods to Avoid  Beverages Milk, such as, whole, 2%, 1%, non-fat, or skim, soy, rice, almond  Caffeinated and decaf tea and coffee  Powdered drink mixes (in moderation)  Non-citrus juices (apple, grape, cranberry or blends of these)  Fruit nectars  Nutritional drinks including Boost, Ensure, Carnation Instant Breakfast Chocolate milk, cocoa or other chocolate-flavored drinks  Carbonated drinks  Alcohol  Citrus juices like orange, grapefruit, lemon and lime  Breads Pancakes, [place] toast and waffles  Crackers (saltine, butter, soda, graham, Goldfish and Cheese Nips)  Toasted bread Untoasted bread, bagels, Kaiser and hard rolls, English muffins  Crackers with nuts, seeds, fresh or dried fruit, coconut, or highly seasoned, such as garlic or onion-flavored  Sweet rolls, coffee cake or doughnuts  Cereals Well cooked cereals, such as oatmeal (plain or flavored)  Cold cereal (Cornflakes, Rice Krispies, Cheerios, Special K plain, Rice Chex and puffed rice) Very coarse cereal, such as bran, shredded wheat  Any cereal with fresh or dried fruit, coconut, seeds or nuts  Desserts Eat in moderation and do not eat desserts or sweets by themselves. Plain cakes, cookies and cream-filled pies  Vanilla and butterscotch pudding or custard  Ice cream, ice milk, frozen yogurt and sherbet  Gelatin made from allowed foods  Fruit ices and popsicles Desserts containing chocolate, coconut, nuts, seeds, fresh or dried fruit, peppermint or spearmint  Eggs   Poached, hard boiled or scrambled Fried eggs and highly seasoned eggs (deviled eggs)  Fats Eat in moderation. Butter and margarine  Mayonnaise and vegetable oils  Mildly seasoned cream sauces and gravies  Plain cream cheese  Sour cream Highly seasoned salad dressings, cream sauces and gravies  Bacon, bacon fat, ham fat, lard and salt pork  Fried foods  Nuts  Fruits Fruit juice  Any canned or cooked fruit except those listed in the AVOID column ALL fresh fruits, such as citrus, bananas and pineapple  Canned pineapple  Dried fruits, such as raisins, berries  Fruits with seeds, such as berries, kiwi and figs  Meat, Fish, Poultry, and [employer] may be ground, minced or chopped to ease swallowing and digestion  Tender, well cooked and moist cuts of beef, chicken, [place] and pork  Veal and lamb  Flaky, cooked fish  Canned tuna  Cottage and ricotta cheeses  Mild cheese, such as American, brick, mozzarella and baby Swiss  Creamy peanut butter  Plain custard or blended fruit yogurt  Moist casseroles, such as macaroni & cheese, tuna noodle  Grilled or toasted cheese sandwich Tough meats with a lot of gristle  Fried, highly seasoned, smoked and fatty meat, fish or poultry, such as frankfurters, luncheon meats, sausage, bacon, spare ribs, beef brisket, sardines, anchovies, duck and goose  Chili and other entrees made with pepper or chili pepper  Shellfish  Strongly flavored cheeses, such as sharp cheese, extra sharp cheddar, cheese containing peppers or other seasonings  Crunchy peanut butter  Any yogurt with nuts, seeds, coconut, strawberries or  raspberries  Potatoes and Starches Peeled, mashed or boiled white or sweet potatoes  Oven-baked potatoes without skin  Well cooked white rice, enriched noodles, barley, spaghetti, macaroni and other pastas Fried potatoes, potato skins and potato chips  Hard and soft taco shells  Fried, brown or wild rice  Soups Mildly flavored meat stocks   Cream soups made from allowed foods Highly seasoned soups and tomato based soups, cream soups made with gas producing vegetables, such as broccoli, cauliflower, onion, etc.  Sweets and Snacks Use in moderation and do not eat large amounts of sweets by themselves. Syrup, honey, jelly and seedless jam  Plain hard candies and plain candies made with allowed ingredients  Molasses  Marshmallows  Other candy made from allowed ingredients  Thin pretzels Jam, marmalade and preserves  Chocolate in any form  Any candy containing nuts, coconut, seeds, peppermint, spearmint or dried or fresh fruit  Popcorn, potato chips, tortilla chips  Soft or hard thick pretzels, such as sourdough  Vegetables Well cooked soft vegetables without seeds or skins, such as asparagus tips, beets, carrots, green and wax beans, chopped spinach, tender canned baby peas, squash and pumpkin Raw vegetables, tomatoes, tomato juice, tomato sauce and V-8 juice  Gas producing vegetables, such as broccoli, Brussel sprouts, cabbage, cauliflower, onions, corn, cucumber, green peppers, rutabagas, turnips, radishes and sauerkraut  Dried beans, peas and lentils  Miscellaneous Salt and spices in moderation  Mustard and vinegar in moderation Fried or highly seasoned foods  Coconut and seeds  Pickles and olives  Chili sauces, ketchup, barbecue sauce, horseradish, black pepper, chili powder and onion and garlic seasonings  Any other strongly flavored seasoning, condiment, spice or herb not tolerated  Any food not tolerated

## 2021-03-17 NOTE — Progress Notes (Signed)
Mikayela is 75 yo s/p Rob paraesophageal Doing very well.  Tolerating diet.  No dysphagia no nausea no vomiting no fevers no chills.  No reflux.  She has had a dramatic improvement in his respiratory symptoms.  PE: NAd Abdomen: Soft, incisions healing well without evidence of infection.  No peritonitis.  A/p Doing very well without complications.  We will see her in a few months.  No heavy lifting I advance diet in about 1 to 2 weeks

## 2021-03-22 ENCOUNTER — Telehealth: Payer: Self-pay | Admitting: Surgery

## 2021-03-22 ENCOUNTER — Other Ambulatory Visit: Payer: Self-pay

## 2021-03-22 ENCOUNTER — Telehealth: Payer: Self-pay

## 2021-03-22 NOTE — Chronic Care Management (AMB) (Signed)
Chronic Care Management Pharmacy Assistant   Name: [name] [name]  MRN: [id number] DOB: 1945/10/24   Reason for Encounter: Medication Coordination for [facility]     Recent office visits:  03/02/21 Orders Only. D/C to Non-Compliance: Ibuprofen 400 mg, Cyclobenzaprine 5 mg, Diclofenac Sodium 1% Gen, Glucose Test Strips, Isosorbide Mononitrate 30 mg, Onetouch delica lancets, pregabalin 143m, tramadol 50 mg, onetouch verio device kit.   Recent consult visits:  03/16/21 (Gen Surgery) PCaroleen HammanMD. Seen for post op. No med changes.  Hospital visits:  None   Medications: Outpatient Encounter Medications as of 03/22/2021  Medication Sig   albuterol (PROVENTIL) (2.5 MG/3ML) 0.083% nebulizer solution Take 3 mLs (2.5 mg total) by nebulization every 6 (six) hours as needed for wheezing or shortness of breath.   albuterol (VENTOLIN HFA) 108 (90 Base) MCG/ACT inhaler INHALE 2 PUFFS INTO THE LUNGS EVERY 6 HOURS AS NEEDED FOR WHEEZING OR SHORTNESS OF BREATH   aspirin EC 81 MG tablet Take 1 tablet (81 mg total) by mouth daily. Swallow whole.   Budeson-Glycopyrrol-Formoterol 160-9-4.8 MCG/ACT AERO Inhale 2 puffs into the lungs in the morning and at bedtime. (Patient taking differently: Inhale 2 puffs into the lungs 2 (two) times daily as needed (shortness of breath).)   buPROPion (WELLBUTRIN XL) 150 MG 24 hr tablet TAKE 1 TABLET(150 MG) BY MOUTH DAILY   cloNIDine (CATAPRES) 0.1 MG tablet Take 1 tablet (0.1 mg total) by mouth 2 (two) times daily.   diazepam (VALIUM) 5 MG tablet TAKE 1 TABLET BY MOUTH EVERY 12 HOURS AS NEEDED FOR ANXIETY. WATCH FOR SEDATION   isosorbide mononitrate (IMDUR) 30 MG 24 hr tablet Take 0.5 tablets (15 mg total) by mouth daily.   lisinopril-hydrochlorothiazide (ZESTORETIC) 20-12.5 MG tablet TAKE TWO TABLETS BY MOUTH EVERY MORNING   nitroGLYCERIN (NITROSTAT) 0.4 MG SL tablet Place 1 tablet (0.4 mg total) under the tongue every 5 (five) minutes as needed for chest pain.    omeprazole (PRILOSEC) 40 MG capsule Take 1 capsule (40 mg total) by mouth daily.   oxyCODONE-acetaminophen (PERCOCET) 10-325 MG tablet Take 1 tablet by mouth every 6 (six) hours as needed for pain.   promethazine (PHENERGAN) 25 MG tablet Take 1 tablet (25 mg total) by mouth every 6 (six) hours as needed for nausea or vomiting.   rosuvastatin (CRESTOR) 10 MG tablet Take 1 tablet (10 mg total) by mouth daily.   traZODone (DESYREL) 100 MG tablet Take 1 tablet (100 mg total) by mouth at bedtime.   Vitamin D, Ergocalciferol, (DRISDOL) 1.25 MG (50000 UNIT) CAPS capsule TAKE ONE CAPSULE BY MOUTH every SEVEN DAYS   No facility-administered encounter medications on file as of 03/22/2021.    Reviewed chart for medication changes ahead of medication coordination call.  No OVs, Consults, or hospital visits since last care coordination call/Pharmacist visit. (If appropriate, list visit date, provider name)  No medication changes indicated OR if recent visit, treatment plan here.  BP Readings from Last 3 Encounters:  03/16/21 (!) 142/93  02/24/21 (!) 157/64  02/02/21 (!) 177/105    Lab Results  Component Value Date   HGBA1C 5.1 07/14/2020     Patient obtains medications through Adherence Packaging  30 Days   Last adherence delivery included:  None. Pt was re-onboarded   Patient is due for next adherence delivery on: 04/01/21 . Called patient and reviewed medications and coordinated delivery.  This delivery to include: Asprin 81 mg 1 at breakfast Rosuvastatin 10 mg 1 at bedtime Bupropion XL  150 mg 1 at breakfast Clonidine 0.1 mg 1 at breakfast and 1 at Bedtime Lisinopril-HCTZ 20-12.5 mg 2 at breakfast Promethazine 25 mg 1 every 6 hours prn (Vials) Trazadone 100 mg 1 at bedtime prn (Vials) Oxycodone-apap 10-325 mg 1 every 6 hours prn (Vials) Vitamin D 5000 units 1 at breakfast Omeprazole 40 mg 1 at breakfast  Diazepan 5 mg 1 every 12 hours prn (Vials)   Patient needs refills: Sent  refill request to provider  Bupropion XL 150 mg 1 at breakfast Clonidine 0.1 mg 1 at breakfast and 1 at Bedtime Lisinopril-HCTZ 20-12.5 mg 2 at breakfast Promethazine 25 mg  Trazadone 100 mg Oxycodone-apap 10-325 mg Vitamin D 5000 units 1 at breakfast Omeprazole 40 mg  Diazepan 5 mg Budesonide/glycopyrrl/formot 160-9-48   Pt declined meds: Pt has enough supply on hand  Budesonide/glycopyrrl/formot 160-9-48 inhaler  Albuterol inhaler 108  Nitroglycerin 0.4 mg Isosorbide Monoitrate 30 mg not due unitl 12/15   Confirmed delivery date of 04/01/21, advised patient that pharmacy will contact them the morning of delivery.   Elray Mcgregor, [place] Pharmacist Assistant  [phone]

## 2021-03-22 NOTE — Telephone Encounter (Signed)
Patient is calling and is complaining of having pain on the left side of her stomach patient is asking if she can get a refill on her trazodone, please call patient and advise.

## 2021-03-23 ENCOUNTER — Other Ambulatory Visit: Payer: Self-pay | Admitting: Physician Assistant

## 2021-03-23 MED ORDER — TRAMADOL HCL 50 MG PO TABS: 50.0000 mg | ORAL_TABLET | Freq: Four times a day (QID) | ORAL | 0 refills | Status: DC | PRN

## 2021-03-23 NOTE — Telephone Encounter (Signed)
Patient notified that we have refilled her Tramadol pain medication. She is aware to call if her symptoms do not improve or worsen.

## 2021-03-23 NOTE — Telephone Encounter (Signed)
Compliant on medications, f/u next month

## 2021-03-24 ENCOUNTER — Other Ambulatory Visit: Payer: Self-pay

## 2021-03-24 DIAGNOSIS — F4321 Adjustment disorder with depressed mood: Secondary | ICD-10-CM

## 2021-03-24 DIAGNOSIS — I10 Essential (primary) hypertension: Secondary | ICD-10-CM

## 2021-03-24 DIAGNOSIS — K529 Noninfective gastroenteritis and colitis, unspecified: Secondary | ICD-10-CM

## 2021-03-24 DIAGNOSIS — F322 Major depressive disorder, single episode, severe without psychotic features: Secondary | ICD-10-CM

## 2021-03-24 MED ORDER — PROMETHAZINE HCL 25 MG PO TABS: 25.0000 mg | ORAL_TABLET | Freq: Four times a day (QID) | ORAL | 3 refills | Status: DC | PRN

## 2021-03-24 MED ORDER — TRAZODONE HCL 100 MG PO TABS: 100.0000 mg | ORAL_TABLET | Freq: Every day | ORAL | 1 refills | Status: DC

## 2021-03-24 MED ORDER — BUDESON-GLYCOPYRROL-FORMOTEROL 160-9-4.8 MCG/ACT IN AERO: 2.0000 | INHALATION_SPRAY | Freq: Two times a day (BID) | RESPIRATORY_TRACT | 2 refills | Status: DC | PRN

## 2021-03-24 MED ORDER — LISINOPRIL-HYDROCHLOROTHIAZIDE 20-12.5 MG PO TABS: 2.0000 | ORAL_TABLET | Freq: Every morning | ORAL | 1 refills | Status: DC

## 2021-03-24 MED ORDER — VITAMIN D (ERGOCALCIFEROL) 1.25 MG (50000 UNIT) PO CAPS: 50000.0000 [IU] | ORAL_CAPSULE | ORAL | 1 refills | Status: DC

## 2021-03-24 MED ORDER — BUPROPION HCL ER (XL) 150 MG PO TB24: ORAL_TABLET | ORAL | 2 refills | Status: DC

## 2021-03-24 MED ORDER — OMEPRAZOLE 40 MG PO CPDR: 40.0000 mg | DELAYED_RELEASE_CAPSULE | Freq: Every day | ORAL | 1 refills | Status: DC

## 2021-03-24 MED ORDER — CLONIDINE HCL 0.1 MG PO TABS: 0.1000 mg | ORAL_TABLET | Freq: Two times a day (BID) | ORAL | 3 refills | Status: DC

## 2021-03-28 DIAGNOSIS — F322 Major depressive disorder, single episode, severe without psychotic features: Secondary | ICD-10-CM

## 2021-03-28 DIAGNOSIS — I10 Essential (primary) hypertension: Secondary | ICD-10-CM

## 2021-03-28 DIAGNOSIS — E1169 Type 2 diabetes mellitus with other specified complication: Secondary | ICD-10-CM

## 2021-03-28 DIAGNOSIS — E782 Mixed hyperlipidemia: Secondary | ICD-10-CM

## 2021-03-29 ENCOUNTER — Telehealth: Payer: Self-pay

## 2021-03-29 NOTE — Chronic Care Management (AMB) (Signed)
I called the pt today to verify what day she takes her Vitamin D and she stated she would like to pick Fridays. I messaged the pharmacy to let them know. She also wants to start sending RX from her pain doctor for [place] to fill her pain meds. She will talk with her Dr come January at her next appt.   Elray Mcgregor, [place] Pharmacist Assistant  [phone]

## 2021-04-20 ENCOUNTER — Telehealth: Payer: Self-pay

## 2021-04-20 NOTE — Chronic Care Management (AMB) (Signed)
Chronic Care Management Pharmacy Assistant   Name: MERAL GEISSINGER  MRN: [id number] DOB: July 16, 1945   Reason for Encounter: Medication Coordination for [facility]     Recent office visits:  None   Recent consult visits:  03/23/21 (Surgical) Orders Only. Edison Simon PA-C  Ordered Tramadol 50 mg every 6 hours prn.  Hospital visits:  None   Medications: Outpatient Encounter Medications as of 04/20/2021  Medication Sig   albuterol (PROVENTIL) (2.5 MG/3ML) 0.083% nebulizer solution Take 3 mLs (2.5 mg total) by nebulization every 6 (six) hours as needed for wheezing or shortness of breath.   albuterol (VENTOLIN HFA) 108 (90 Base) MCG/ACT inhaler INHALE 2 PUFFS INTO THE LUNGS EVERY 6 HOURS AS NEEDED FOR WHEEZING OR SHORTNESS OF BREATH   aspirin EC 81 MG tablet Take 1 tablet (81 mg total) by mouth daily. Swallow whole.   Budeson-Glycopyrrol-Formoterol 160-9-4.8 MCG/ACT AERO Inhale 2 puffs into the lungs 2 (two) times daily as needed (shortness of breath).   buPROPion (WELLBUTRIN XL) 150 MG 24 hr tablet TAKE 1 TABLET(150 MG) BY MOUTH DAILY   cloNIDine (CATAPRES) 0.1 MG tablet Take 1 tablet (0.1 mg total) by mouth 2 (two) times daily.   diazepam (VALIUM) 5 MG tablet TAKE 1 TABLET BY MOUTH EVERY 12 HOURS AS NEEDED FOR ANXIETY. WATCH FOR SEDATION   isosorbide mononitrate (IMDUR) 30 MG 24 hr tablet Take 0.5 tablets (15 mg total) by mouth daily.   lisinopril-hydrochlorothiazide (ZESTORETIC) 20-12.5 MG tablet Take 2 tablets by mouth every morning.   nitroGLYCERIN (NITROSTAT) 0.4 MG SL tablet Place 1 tablet (0.4 mg total) under the tongue every 5 (five) minutes as needed for chest pain.   omeprazole (PRILOSEC) 40 MG capsule Take 1 capsule (40 mg total) by mouth daily.   oxyCODONE-acetaminophen (PERCOCET) 10-325 MG tablet Take 1 tablet by mouth every 6 (six) hours as needed for pain.   promethazine (PHENERGAN) 25 MG tablet Take 1 tablet (25 mg total) by mouth every 6 (six) hours as needed for  nausea or vomiting.   rosuvastatin (CRESTOR) 10 MG tablet Take 1 tablet (10 mg total) by mouth daily.   traMADol (ULTRAM) 50 MG tablet Take 1 tablet (50 mg total) by mouth every 6 (six) hours as needed (mild pain).   traZODone (DESYREL) 100 MG tablet Take 1 tablet (100 mg total) by mouth at bedtime.   Vitamin D, Ergocalciferol, (DRISDOL) 1.25 MG (50000 UNIT) CAPS capsule Take 1 capsule (50,000 Units total) by mouth every 7 (seven) days.   No facility-administered encounter medications on file as of 04/20/2021.   Reviewed chart for medication changes ahead of medication coordination call.  No OVs, Consults, or hospital visits since last care coordination call/Pharmacist visit. (If appropriate, list visit date, provider name)  No medication changes indicated OR if recent visit, treatment plan here.  BP Readings from Last 3 Encounters:  03/16/21 (!) 142/93  02/24/21 (!) 157/64  02/02/21 (!) 177/105    Lab Results  Component Value Date   HGBA1C 5.1 07/14/2020     Patient obtains medications through Adherence Packaging  30 Days   Last adherence delivery included:  Asprin 81 mg 1 at breakfast Rosuvastatin 10 mg 1 at bedtime Bupropion XL 150 mg 1 at breakfast Clonidine 0.1 mg 1 at breakfast and 1 at Bedtime Lisinopril-HCTZ 20-12.5 mg 2 at breakfast Promethazine 25 mg 1 every 6 hours prn (Vials) Trazadone 100 mg 1 at bedtime prn (Vials) Oxycodone-apap 10-325 mg 1 every 6 hours prn (Vials) Vitamin D  5000 units 1 at breakfast Omeprazole 40 mg 1 at breakfast  Diazepan 5 mg 1 every 12 hours prn (Vials  Patient declined (meds) last month due to having enough supply on hand  Budesonide/glycopyrrl/formot 160-9-48 inhaler  Albuterol inhaler 108  Nitroglycerin 0.4 mg Isosorbide Monoitrate 30 mg not due unitl 12/15  Patient is due for next adherence delivery on: 05/02/21 . Called patient and reviewed medications and coordinated delivery.  This delivery to include: Asprin 81 mg 1 at  breakfast Rosuvastatin 10 mg 1 at bedtime Bupropion XL 150 mg 1 at breakfast Clonidine 0.1 mg 1 at breakfast and 1 at Bedtime Lisinopril-HCTZ 20-12.5 mg 2 at breakfast Promethazine 25 mg 1 every 6 hours prn (Vials) Trazadone 100 mg 1 at bedtime prn (Vials) Vitamin D 5000 units 1 at breakfast Omeprazole 40 mg 1 at breakfast   Patient declined the following medications  Oxycodone-APAP 10-325 mg Cannot refill until 12/5 and gets at [facility] 5 mg 1 every 12 hours prn Gets at [facility]  Isosorbide Monoitrate 30 mg not due unitl 12/15 Budesonide/glycopyrrl/formot 160-9-48 inhaler  Albuterol inhaler 108   Patient needs refills None   Acute fill  Isosorbide Monoitrate 30 mg Delivery Date 05/12/21  Confirmed delivery date of 05/02/21, advised patient that pharmacy will contact them the morning of delivery.  Elray Mcgregor, [place] Pharmacist Assistant  [phone]

## 2021-04-25 ENCOUNTER — Other Ambulatory Visit: Payer: Self-pay | Admitting: Legal Medicine

## 2021-04-25 DIAGNOSIS — F322 Major depressive disorder, single episode, severe without psychotic features: Secondary | ICD-10-CM

## 2021-05-05 ENCOUNTER — Encounter: Payer: Self-pay | Admitting: Legal Medicine

## 2021-05-05 ENCOUNTER — Ambulatory Visit (INDEPENDENT_AMBULATORY_CARE_PROVIDER_SITE_OTHER): Payer: Medicare Other | Admitting: Legal Medicine

## 2021-05-05 ENCOUNTER — Other Ambulatory Visit: Payer: Self-pay

## 2021-05-05 VITALS — BP 122/80 | HR 74 | Temp 97.8°F | Resp 16 | Ht 63.0 in | Wt 154.0 lb

## 2021-05-05 DIAGNOSIS — J449 Chronic obstructive pulmonary disease, unspecified: Secondary | ICD-10-CM

## 2021-05-05 DIAGNOSIS — Z720 Tobacco use: Secondary | ICD-10-CM | POA: Diagnosis not present

## 2021-05-05 DIAGNOSIS — I2510 Atherosclerotic heart disease of native coronary artery without angina pectoris: Secondary | ICD-10-CM | POA: Diagnosis not present

## 2021-05-05 DIAGNOSIS — E782 Mixed hyperlipidemia: Secondary | ICD-10-CM | POA: Diagnosis not present

## 2021-05-05 DIAGNOSIS — I10 Essential (primary) hypertension: Secondary | ICD-10-CM | POA: Diagnosis not present

## 2021-05-05 DIAGNOSIS — Z79891 Long term (current) use of opiate analgesic: Secondary | ICD-10-CM | POA: Diagnosis not present

## 2021-05-05 DIAGNOSIS — Z6828 Body mass index (BMI) 28.0-28.9, adult: Secondary | ICD-10-CM

## 2021-05-05 DIAGNOSIS — E1169 Type 2 diabetes mellitus with other specified complication: Secondary | ICD-10-CM | POA: Diagnosis not present

## 2021-05-05 DIAGNOSIS — Z23 Encounter for immunization: Secondary | ICD-10-CM

## 2021-05-05 NOTE — Progress Notes (Signed)
Subjective:  Patient ID: [name] [name], female    DOB: Aug 27, 1945  Age: 75 y.o. MRN: [id number]  Chief Complaint  Patient presents with   Diabetes   Hypertension   Hyperlipidemia    HPI: chronic visit Patient had a nisson fundiplication recently.  Patient present with type 2 diabetes.  Specifically, this is type 2, non insulin requiring diabetes, complicated by hypertension and hypercholesterolemia.  Compliance with treatment has been good; patient take medicines as directed, maintains diet and exercise regimen, follows up as directed, and is keeping glucose diary.  Date of  diagnosis 2010.  Depression screen has been performed.Tobacco screen smoker. Current medicines for diabetes none.  Patient is on lisinopril for renal protection and crestor for cholesterol control.  Patient performs foot exams daily and last ophthalmologic exam was needs eye exam.   Patient presents for follow up of hypertension.  Patient tolerating lisinopril /hctz, clonidinewell with side effects.  Patient was diagnosed with hypertension 2010 so has been treated for hypertension for 12 years.Patient is working on maintaining diet and exercise regimen and follows up as directed. Complication include cad.   Patient presents with hyperlipidemia.  Compliance with treatment has been good; patient takes medicines as directed, maintains low cholesterol diet, follows up as directed, and maintains exercise regimen.  Patient is using crestor without problems.   An individual plan was formulated based on patient history and exam, labs and evidence based data. Patient has not had recent angina or nitroglycerin use. continue present treatment.    Current Outpatient Medications on File Prior to Visit  Medication Sig Dispense Refill   albuterol (PROVENTIL) (2.5 MG/3ML) 0.083% nebulizer solution Take 3 mLs (2.5 mg total) by nebulization every 6 (six) hours as needed for wheezing or shortness of breath. 120 mL 6   albuterol (VENTOLIN  HFA) 108 (90 Base) MCG/ACT inhaler INHALE 2 PUFFS INTO THE LUNGS EVERY 6 HOURS AS NEEDED FOR WHEEZING OR SHORTNESS OF BREATH 8.5 g 6   aspirin EC 81 MG tablet Take 1 tablet (81 mg total) by mouth daily. Swallow whole. 90 tablet 3   Budeson-Glycopyrrol-Formoterol 160-9-4.8 MCG/ACT AERO Inhale 2 puffs into the lungs 2 (two) times daily as needed (shortness of breath). 10.7 g 2   buPROPion (WELLBUTRIN XL) 150 MG 24 hr tablet TAKE 1 TABLET(150 MG) BY MOUTH DAILY 90 tablet 2   cloNIDine (CATAPRES) 0.1 MG tablet Take 1 tablet (0.1 mg total) by mouth 2 (two) times daily. 180 tablet 3   diazepam (VALIUM) 5 MG tablet TAKE 1 TABLET BY MOUTH EVERY 12 HOURS AS NEEDED FOR ANXIETY. WATCH FOR SEDATION 60 tablet 3   isosorbide mononitrate (IMDUR) 30 MG 24 hr tablet Take 0.5 tablets (15 mg total) by mouth daily. 45 tablet 3   lisinopril-hydrochlorothiazide (ZESTORETIC) 20-12.5 MG tablet Take 2 tablets by mouth every morning. 180 tablet 1   nitroGLYCERIN (NITROSTAT) 0.4 MG SL tablet Place 1 tablet (0.4 mg total) under the tongue every 5 (five) minutes as needed for chest pain. 25 tablet 3   omeprazole (PRILOSEC) 40 MG capsule Take 1 capsule (40 mg total) by mouth daily. 90 capsule 1   oxyCODONE-acetaminophen (PERCOCET) 10-325 MG tablet Take 1 tablet by mouth every 6 (six) hours as needed for pain.     promethazine (PHENERGAN) 25 MG tablet Take 1 tablet (25 mg total) by mouth every 6 (six) hours as needed for nausea or vomiting. 30 tablet 3   rosuvastatin (CRESTOR) 10 MG tablet Take 1 tablet (10 mg total)  by mouth daily. 90 tablet 3   traMADol (ULTRAM) 50 MG tablet Take 1 tablet (50 mg total) by mouth every 6 (six) hours as needed (mild pain). 30 tablet 0   traZODone (DESYREL) 100 MG tablet TAKE 1 TABLET BY MOUTH EVERY DAY AT BEDTIME 90 tablet 2   No current facility-administered medications on file prior to visit.   Past Medical History:  Diagnosis Date   Anxiety    Aortic atherosclerosis ([place])    Ascending  aorta dilation ([place])    a.) TTE 09/02/19 --> mild; measured 52mm.   Asthma    CAD (coronary artery disease)    a,) CTA 08/24/20 --> mild CAD (25-49%) in RCA, LAD, LCx; CAC/Agatston score 509.   Cataract    Chronic left shoulder pain 09/27/2015   COPD (chronic obstructive pulmonary disease) (HCC)    Depression    Diastolic dysfunction    a.) TTE 09/02/19 --> LVEF 60-65%; G2DD. b.) TTE 07/23/20 --> LVEF 70-75%; G1DD.   GERD (gastroesophageal reflux disease)    History of kidney stones    Hyperlipidemia    Hypertension    Lupus (HCC)    Myocardial infarction ([place])    Osteoarthritis    PAC (premature atrial contraction)    a.) Holter 12/04/19 --> occassional; 1.1% PAC burden.   Pain of right hip joint 11/22/2015   Paraesophageal hernia    Presbyesophagus    Primary osteoarthritis of left knee 02/04/2015   PSVT (paroxysmal supraventricular tachycardia) ([place])    a.) Holter 12/04/19 --> 22 runs.   Schatzki's ring    SOB (shortness of breath)    T2DM (type 2 diabetes mellitus) ([place])    Past Surgical History:  Procedure Laterality Date   ABDOMINAL HYSTERECTOMY     BLADDER SUSPENSION     BREAST BIOPSY Right    COLONOSCOPY  09/04/2011   Colonic polyps, status post polyectomy. Incidental small ascending colon lipoma   ESOPHAGOGASTRODUODENOSCOPY  09/08/2015   Schatzki ring status post esophageal dilitation. Small hiatal hernia   hemorrhoid surgery     KNEE SURGERY     left   TOTAL KNEE ARTHROPLASTY Left 02/15/2015   Procedure: TOTAL KNEE ARTHROPLASTY;  Surgeon: Vickey Huger, MD;  Location: [facility];  Service: Orthopedics;  Laterality: Left;   TUBAL LIGATION     WRIST SURGERY     right   XI ROBOTIC ASSISTED PARAESOPHAGEAL HERNIA REPAIR N/A 02/22/2021   Procedure: XI ROBOTIC ASSISTED PARAESOPHAGEAL HERNIA REPAIR with RNFA to assist;  Surgeon: Jules Husbands, MD;  Location: [facility];  Service: General;  Laterality: N/A;    Family History  Problem Relation Age of Onset   Stroke Mother     Heart disease Mother    COPD Father    Cancer Father        Bone   Diabetes Father    Heart disease Father    Stroke Father    Lupus Sister    Seizures Son    COPD Son    Colon cancer Neg Hx    Esophageal cancer Neg Hx    Stomach cancer Neg Hx    Rectal cancer Neg Hx    Social History   Socioeconomic History   Marital status: Widowed    Spouse name: Not on file   Number of children: 4   Years of education: Not on file   Highest education level: Not on file  Occupational History   Occupation: Retired  Tobacco Use   Smoking status: Every Day  Packs/day: 1.00    Years: 47.00    Pack years: 47.00    Types: Cigarettes   Smokeless tobacco: Never   Tobacco comments:    Smokes 1 pack daily 01/04/21 [facility]   Vaping Use   Vaping Use: Never used  Substance and Sexual Activity   Alcohol use: No   Drug use: No   Sexual activity: Not Currently  Other Topics Concern   Not on file  Social History Narrative   Not on file   Social Determinants of Health   Financial Resource Strain: Low Risk    Difficulty of Paying Living Expenses: Not hard at all  Food Insecurity: No Food Insecurity   Worried About Charity fundraiser in the Last Year: Never true   Arboriculturist in the Last Year: Never true  Transportation Needs: Unmet Transportation Needs   Lack of Transportation (Medical): Yes   Lack of Transportation (Non-Medical): Yes  Physical Activity: Insufficiently Active   Days of Exercise per Week: 2 days   Minutes of Exercise per Session: 20 min  Stress: Stress Concern Present   Feeling of Stress : To some extent  Social Connections: Socially Isolated   Frequency of Communication with Friends and Family: Three times a week   Frequency of Social Gatherings with Friends and Family: Never   Attends Religious Services: Never   Marine scientist or Organizations: No   Attends Archivist Meetings: Never   Marital Status: Widowed    Review of Systems   Constitutional:  Negative for chills, fatigue and fever.  HENT:  Negative for congestion, ear pain and sore throat.   Respiratory:  Positive for cough and shortness of breath.   Cardiovascular:  Negative for chest pain and palpitations.  Gastrointestinal:  Negative for abdominal pain, constipation, diarrhea, nausea and vomiting.  Endocrine: Negative for polydipsia, polyphagia and polyuria.  Genitourinary:  Negative for difficulty urinating and dysuria.  Musculoskeletal:  Positive for arthralgias, back pain and gait problem. Negative for myalgias.  Skin:  Negative for rash.  Neurological:  Negative for headaches.  Psychiatric/Behavioral:  Negative for dysphoric mood. The patient is not nervous/anxious.     Objective:  BP 122/80   Pulse 74   Temp 97.8 F (36.6 C)   Resp 16   Ht 5\' 3"  (1.6 m)   Wt 154 lb (69.9 kg)   SpO2 98%   BMI 27.28 kg/m   BP/Weight 05/05/2021 03/16/2021 08/09/9700  Systolic BP 637 858 850  Diastolic BP 80 93 64  Wt. (Lbs) 154 161.4 -  BMI 27.28 28.59 -    Physical Exam Vitals reviewed.  Constitutional:      General: She is not in acute distress.    Appearance: Normal appearance.  HENT:     Head: Normocephalic.     Right Ear: Tympanic membrane, ear canal and external ear normal.     Left Ear: Tympanic membrane, ear canal and external ear normal.     Mouth/Throat:     Pharynx: Oropharynx is clear.  Eyes:     Extraocular Movements: Extraocular movements intact.     Conjunctiva/sclera: Conjunctivae normal.     Pupils: Pupils are equal, round, and reactive to light.  Cardiovascular:     Rate and Rhythm: Normal rate and regular rhythm.     Pulses: Normal pulses.     Heart sounds: Normal heart sounds. No murmur heard.   No gallop.  Pulmonary:     Effort: Pulmonary effort is  normal. No respiratory distress.     Breath sounds: Normal breath sounds. No wheezing.  Abdominal:     General: Abdomen is flat. Bowel sounds are normal. There is no distension.      Tenderness: There is no abdominal tenderness.  Musculoskeletal:     Cervical back: Normal range of motion.  Skin:    General: Skin is warm.     Capillary Refill: Capillary refill takes less than 2 seconds.  Neurological:     General: No focal deficit present.     Mental Status: She is alert and oriented to person, place, and time. Mental status is at baseline.  Psychiatric:        Mood and Affect: Mood normal.        Thought Content: Thought content normal.    Diabetic Foot Exam - Simple   Simple Foot Form Diabetic Foot exam was performed with the following findings: Yes 05/05/2021  2:41 PM  Visual Inspection No deformities, no ulcerations, no other skin breakdown bilaterally: Yes Sensation Testing Intact to touch and monofilament testing bilaterally: Yes Pulse Check Posterior Tibialis and Dorsalis pulse intact bilaterally: Yes Comments     Depression screen [facility] 2/9 05/05/2021 01/18/2021 10/12/2020 09/03/2020 07/14/2020  Decreased Interest 1 2 2 1 1   Down, Depressed, Hopeless 2 3 2 2 2   PHQ - 2 Score 3 5 4 3 3   Altered sleeping 1 3 3 2 2   Tired, decreased energy 3 3 3 2 2   Change in appetite 2 1 0 2 2  Feeling bad or failure about yourself  1 0 0 2 2  Trouble concentrating 3 3 3 1 1   Moving slowly or fidgety/restless 3 2 2 1 1   Suicidal thoughts 0 0 0 0 0  PHQ-9 Score 16 17 15 13 13   Difficult doing work/chores Somewhat difficult Somewhat difficult Not difficult at all Very difficult Very difficult    Lab Results  Component Value Date   WBC 9.3 02/23/2021   HGB 11.6 (L) 02/23/2021   HCT 33.0 (L) 02/23/2021   PLT 142 (L) 02/23/2021   GLUCOSE 144 (H) 02/23/2021   CHOL 152 01/18/2021   TRIG 53 01/18/2021   HDL 61 01/18/2021   LDLCALC 80 01/18/2021   ALT 16 01/18/2021   AST 17 01/18/2021   NA 133 (L) 02/23/2021   K 3.5 02/23/2021   CL 99 02/23/2021   CREATININE 0.79 02/23/2021   BUN 14 02/23/2021   CO2 25 02/23/2021   INR 1.02 01/28/2015   HGBA1C 5.1 07/14/2020       Assessment & Plan:   Problem List Items Addressed This Visit       Cardiovascular and Mediastinum   Essential hypertension - Primary   Relevant Orders   CBC with Differential/Platelet   Comprehensive metabolic panel An individual hypertension care plan was established and reinforced today.  The patient's status was assessed using clinical findings on exam and labs or diagnostic tests. The patient's success at meeting treatment goals on disease specific evidence-based guidelines and found to be well controlled. SELF MANAGEMENT: The patient and I together assessed ways to personally work towards obtaining the recommended goals. RECOMMENDATIONS: avoid decongestants found in common cold remedies, decrease consumption of alcohol, perform routine monitoring of BP with home BP cuff, exercise, reduction of dietary salt, take medicines as prescribed, try not to miss doses and quit smoking.  Regular exercise and maintaining a healthy weight is needed.  Stress reduction may help. A CLINICAL SUMMARY including written  plan identify barriers to care unique to individual due to social or financial issues.  We attempt to mutually creat solutions for individual and family understanding.     Coronary artery disease involving native coronary artery of native heart without angina pectoris An individual plan was formulated based on patient history and exam, labs and evidence based data. Patient has not  had recent angina or nitroglycerin use. continue present treatment.      Respiratory   COPD (chronic obstructive pulmonary disease) ([place]) An individualize plan was formulated for care of COPD.  Treatment is evidence based.  She will continue on inhalers, avoid smoking and smoke.  Regular exercise with help with dyspnea. Routine follow ups and medication compliance is needed.      Endocrine   Type 2 diabetes mellitus with other specified complication ([place])   Relevant Orders   Hemoglobin A1c An  individual care plan for diabetes was established and reinforced today.  The patient's status was assessed using clinical findings on exam, labs and diagnostic testing. Patient success at meeting goals based on disease specific evidence-based guidelines and found to be good controlled. Medications were assessed and patient's understanding of the medical issues , including barriers were assessed. Recommend adherence to a diabetic diet, a graduated exercise program, HgbA1c level is checked quarterly, and urine microalbumin performed yearly .  Annual mono-filament sensation testing performed. Lower blood pressure and control hyperlipidemia is important. Get annual eye exams and annual flu shots and smoking cessation discussed.  Self management goals were discussed.      Other   Long-term current use of opiate analgesic AN INDIVIDUAL CARE PLAN was established and reinforced today.  The patient's status was assessed using clinical findings on exam, labs, and other diagnostic testing. Patient's success at meeting treatment goals based on disease specific evidence-bassed guidelines and found to be in fair control. RECOMMENDATIONS include maintining present medicines and treatment. He is on chronic oxycodone with no abuse.  Negative REMS.     Hyperlipidemia   Relevant Orders   Lipid panel AN INDIVIDUAL CARE PLAN for hyperlipidemia/ cholesterol was established and reinforced today.  The patient's status was assessed using clinical findings on exam, lab and other diagnostic tests. The patient's disease status was assessed based on evidence-based guidelines and found to be well controlled. MEDICATIONS were reviewed. SELF MANAGEMENT GOALS have been discussed and patient's success at attaining the goal of low cholesterol was assessed. RECOMMENDATION given include regular exercise 3 days a week and low cholesterol/low fat diet. CLINICAL SUMMARY including written plan to identify barriers unique to the patient due  to social or economic  reasons was discussed.     Tobacco use Individual plan was given to patient based on exam, history and other tests and using evidence based criteria for care for smoking cessation.  We discussed behavioral changes to help cessation and offered counseling to aid in quitting.  Medical consequences of tobacco use were explained.     BMI 28.0-28.9,adult An individualize plan was formulated for obesity using patient history and physical exam to encourage weight loss.  An evidence based program was formulated.  Patient is to cut portion size with meals and to plan physical exercise 3 days a week at least 20 minutes.  Weight watchers and other programs are helpful.  Planned amount of weight loss 10 lbs.   . 30 minute visit with review of records   Orders Placed This Encounter  Procedures   CBC with Differential/Platelet   Comprehensive metabolic panel  Hemoglobin A1c   Lipid panel      Follow-up: Return in about 4 months (around 09/03/2021) for fasting.  An After Visit Summary was printed and given to the patient.  [name], MD [facility] [phone]

## 2021-05-06 LAB — HEMOGLOBIN A1C
Est. average glucose Bld gHb Est-mCnc: 105 mg/dL
Hgb A1c MFr Bld: 5.3 % (ref 4.8–5.6)

## 2021-05-06 LAB — CBC WITH DIFFERENTIAL/PLATELET
Basophils Absolute: 0.1 10*3/uL (ref 0.0–0.2)
Basos: 1 %
EOS (ABSOLUTE): 0.1 10*3/uL (ref 0.0–0.4)
Eos: 2 %
Hematocrit: 41.1 % (ref 34.0–46.6)
Hemoglobin: 14.3 g/dL (ref 11.1–15.9)
Immature Grans (Abs): 0 10*3/uL (ref 0.0–0.1)
Immature Granulocytes: 0 %
Lymphocytes Absolute: 2.3 10*3/uL (ref 0.7–3.1)
Lymphs: 38 %
MCH: 31.8 pg (ref 26.6–33.0)
MCHC: 34.8 g/dL (ref 31.5–35.7)
MCV: 92 fL (ref 79–97)
Monocytes Absolute: 0.6 10*3/uL (ref 0.1–0.9)
Monocytes: 9 %
Neutrophils Absolute: 3.1 10*3/uL (ref 1.4–7.0)
Neutrophils: 50 %
Platelets: 192 10*3/uL (ref 150–450)
RBC: 4.49 x10E6/uL (ref 3.77–5.28)
RDW: 13.1 % (ref 11.7–15.4)
WBC: 6.2 10*3/uL (ref 3.4–10.8)

## 2021-05-06 LAB — COMPREHENSIVE METABOLIC PANEL
ALT: 13 IU/L (ref 0–32)
AST: 18 IU/L (ref 0–40)
Albumin/Globulin Ratio: 2.3 — ABNORMAL HIGH (ref 1.2–2.2)
Albumin: 4.2 g/dL (ref 3.7–4.7)
Alkaline Phosphatase: 66 IU/L (ref 44–121)
BUN/Creatinine Ratio: 12 (ref 12–28)
BUN: 8 mg/dL (ref 8–27)
Bilirubin Total: 0.5 mg/dL (ref 0.0–1.2)
CO2: 27 mmol/L (ref 20–29)
Calcium: 9.9 mg/dL (ref 8.7–10.3)
Chloride: 100 mmol/L (ref 96–106)
Creatinine, Ser: 0.69 mg/dL (ref 0.57–1.00)
Globulin, Total: 1.8 g/dL (ref 1.5–4.5)
Glucose: 86 mg/dL (ref 70–99)
Potassium: 4 mmol/L (ref 3.5–5.2)
Sodium: 141 mmol/L (ref 134–144)
Total Protein: 6 g/dL (ref 6.0–8.5)
eGFR: 90 mL/min/{1.73_m2} (ref >59)

## 2021-05-06 LAB — CARDIOVASCULAR RISK ASSESSMENT

## 2021-05-06 LAB — LIPID PANEL
Chol/HDL Ratio: 2.2 ratio (ref 0.0–4.4)
Cholesterol, Total: 143 mg/dL (ref 100–199)
HDL: 64 mg/dL (ref >39)
LDL Chol Calc (NIH): 66 mg/dL (ref 0–99)
Triglycerides: 62 mg/dL (ref 0–149)
VLDL Cholesterol Cal: 13 mg/dL (ref 5–40)

## 2021-05-06 NOTE — Progress Notes (Signed)
Cbc normal, kidney and liver tests normal, A1c 5.3 good, Cholesterol normal,  lp

## 2021-05-07 ENCOUNTER — Encounter: Payer: Self-pay | Admitting: Legal Medicine

## 2021-05-09 NOTE — Telephone Encounter (Signed)
Unable to leave message due to Voicemail box being full.

## 2021-05-09 NOTE — Telephone Encounter (Signed)
Probably should see, living alone makes her a big fall risk lp

## 2021-05-16 ENCOUNTER — Encounter: Payer: Medicare Other | Admitting: Surgery

## 2021-05-19 ENCOUNTER — Telehealth: Payer: Self-pay

## 2021-05-19 ENCOUNTER — Ambulatory Visit: Payer: Medicare Other | Admitting: Legal Medicine

## 2021-05-19 NOTE — Progress Notes (Signed)
Chronic Care Management Pharmacy Assistant   Name: [name] [name]  MRN: [id number] DOB: 04/22/1946   Reason for Encounter: Medication Coordination for [facility]     Recent office visits:  05/05/21 [name] Meeker MD. Seen for DM, HLD and HTN. D/C Vitamin D 50,000 units due to change in therapy.  Recent consult visits:  None   Hospital visits:  None   Medications: Outpatient Encounter Medications as of 05/19/2021  Medication Sig   albuterol (PROVENTIL) (2.5 MG/3ML) 0.083% nebulizer solution Take 3 mLs (2.5 mg total) by nebulization every 6 (six) hours as needed for wheezing or shortness of breath.   albuterol (VENTOLIN HFA) 108 (90 Base) MCG/ACT inhaler INHALE 2 PUFFS INTO THE LUNGS EVERY 6 HOURS AS NEEDED FOR WHEEZING OR SHORTNESS OF BREATH   aspirin EC 81 MG tablet Take 1 tablet (81 mg total) by mouth daily. Swallow whole.   Budeson-Glycopyrrol-Formoterol 160-9-4.8 MCG/ACT AERO Inhale 2 puffs into the lungs 2 (two) times daily as needed (shortness of breath).   buPROPion (WELLBUTRIN XL) 150 MG 24 hr tablet TAKE 1 TABLET(150 MG) BY MOUTH DAILY   cloNIDine (CATAPRES) 0.1 MG tablet Take 1 tablet (0.1 mg total) by mouth 2 (two) times daily.   diazepam (VALIUM) 5 MG tablet TAKE 1 TABLET BY MOUTH EVERY 12 HOURS AS NEEDED FOR ANXIETY. WATCH FOR SEDATION   isosorbide mononitrate (IMDUR) 30 MG 24 hr tablet Take 0.5 tablets (15 mg total) by mouth daily.   lisinopril-hydrochlorothiazide (ZESTORETIC) 20-12.5 MG tablet Take 2 tablets by mouth every morning.   nitroGLYCERIN (NITROSTAT) 0.4 MG SL tablet Place 1 tablet (0.4 mg total) under the tongue every 5 (five) minutes as needed for chest pain.   omeprazole (PRILOSEC) 40 MG capsule Take 1 capsule (40 mg total) by mouth daily.   oxyCODONE-acetaminophen (PERCOCET) 10-325 MG tablet Take 1 tablet by mouth every 6 (six) hours as needed for pain.   promethazine (PHENERGAN) 25 MG tablet Take 1 tablet (25 mg total) by mouth every 6 (six) hours as  needed for nausea or vomiting.   rosuvastatin (CRESTOR) 10 MG tablet Take 1 tablet (10 mg total) by mouth daily.   traMADol (ULTRAM) 50 MG tablet Take 1 tablet (50 mg total) by mouth every 6 (six) hours as needed (mild pain).   traZODone (DESYREL) 100 MG tablet TAKE 1 TABLET BY MOUTH EVERY DAY AT BEDTIME   No facility-administered encounter medications on file as of 05/19/2021.   Reviewed chart for medication changes ahead of medication coordination call.  No OVs, Consults, or hospital visits since last care coordination call/Pharmacist visit. (If appropriate, list visit date, provider name)  No medication changes indicated OR if recent visit, treatment plan here.  BP Readings from Last 3 Encounters:  05/05/21 122/80  03/16/21 (!) 142/93  02/24/21 (!) 157/64    Lab Results  Component Value Date   HGBA1C 5.3 05/05/2021     Patient obtains medications through Adherence Packaging  30 Days   Last adherence delivery included:  Asprin 81 mg 1 at breakfast Rosuvastatin 10 mg 1 at bedtime Bupropion XL 150 mg 1 at breakfast Clonidine 0.1 mg 1 at breakfast and 1 at Bedtime Lisinopril-HCTZ 20-12.5 mg 2 at breakfast Promethazine 25 mg 1 every 6 hours prn (Vials) Trazadone 100 mg 1 at bedtime prn (Vials) Vitamin D 5000 units 1 at breakfast Omeprazole 40 mg 1 at breakfast   Patient declined (meds) last month  Oxycodone-APAP 10-325 mg Cannot refill until 12/5 and gets at [facility]  5 mg 1 every 12 hours prn Gets at [facility]  Isosorbide Monoitrate 30 mg not due unitl 12/15 Budesonide/glycopyrrl/formot 160-9-48 inhaler  Albuterol inhaler 108   Patient is due for next adherence delivery on: 06/01/21. Called patient and reviewed medications and coordinated delivery.  This delivery to include: Asprin 81 mg 1 at breakfast Rosuvastatin 10 mg 1 at bedtime Bupropion XL 150 mg 1 at breakfast Clonidine 0.1 mg 1 at breakfast and 1 at Bedtime Lisinopril-HCTZ 20-12.5 mg 2 at  breakfast Promethazine 25 mg 1 every 6 hours prn (Vials) Trazadone 100 mg 1 at bedtime prn (Vials) Omeprazole 40 mg 1 at breakfast  Isosorbide Monoitrate 30 mg 0.5 tablet at Breakfast   Patient declined the following medications (meds) due to (reason) Vitamin D 5000 units 1 at breakfast D/C by provider  Oxycodone-APAP 10-325 mg gets at [facility] Diazepan 5 mg 1 every 12 hours prn Gets at [facility]  Budesonide/glycopyrrl/formot 160-9-48 inhaler Pt still has plenty of supply. Only uses PRN  Albuterol inhaler 108 Pt still has plenty of supply. Only uses PRN   Patient needs refills None   Confirmed delivery date of 06/01/21, advised patient that pharmacy will contact them the morning of delivery.  [name] [name], [place] Pharmacist Assistant  [phone]

## 2021-06-01 ENCOUNTER — Ambulatory Visit: Payer: Commercial Managed Care - HMO | Admitting: Surgery

## 2021-06-08 ENCOUNTER — Other Ambulatory Visit: Payer: Self-pay

## 2021-06-08 ENCOUNTER — Ambulatory Visit (INDEPENDENT_AMBULATORY_CARE_PROVIDER_SITE_OTHER): Payer: Commercial Managed Care - HMO | Admitting: Surgery

## 2021-06-08 ENCOUNTER — Encounter: Payer: Self-pay | Admitting: Surgery

## 2021-06-08 VITALS — BP 136/86 | HR 61 | Temp 97.9°F | Ht 62.0 in | Wt 145.8 lb

## 2021-06-08 DIAGNOSIS — R109 Unspecified abdominal pain: Secondary | ICD-10-CM | POA: Diagnosis not present

## 2021-06-08 MED ORDER — ONDANSETRON HCL 4 MG PO TABS: 4.0000 mg | ORAL_TABLET | Freq: Four times a day (QID) | ORAL | 0 refills | Status: DC | PRN

## 2021-06-08 NOTE — Patient Instructions (Addendum)
Please reschedule your appointment with [name] [name] for next week. Please pick up your medication at the pharmacy. You will need to pick up your prep kit several days prior to having the scan. Nothing to eat/drink 4 hours prior.   CT scheduled Abdomin and Pelvis 06/24/2021 @ 3pm.  [facility] [place] [id number]

## 2021-06-09 ENCOUNTER — Ambulatory Visit: Payer: Medicare Other | Admitting: Gastroenterology

## 2021-06-10 NOTE — Progress Notes (Signed)
[name] [name] is 4 months out after robotic paraesophageal hernia. She is still complaining of some upper abdominal pain.  She is able to swallow.  She denies any reflux.  Pulmonary Symptoms are okay. SHe is walking  PE NAD Abd: soft, nt , incisions healed without infection or hernias.  A/p  Overall doing ok w some abd pain . I am not sure if this is truly related to her prior surgery from 4 months ago. We will order CT to be on the cautious side. Please note that I spent >  20 minutes in this encounter including coordination of care and counseling. She does have appt w GI.  We will follow after CT

## 2021-06-20 ENCOUNTER — Telehealth: Payer: Self-pay

## 2021-06-20 NOTE — Chronic Care Management (AMB) (Signed)
Chronic Care Management Pharmacy Assistant   Name: [name] [name]  MRN: [id number] DOB: 03/10/1946   Reason for Encounter: Medication Coordination for [facility]    Recent office visits:  None  Recent consult visits:  06/08/21 (Surgical) Caroleen Hamman MD. Seen for Post Op. Started on Ondansetron HCI 4 mg every 6 hours prn. D/C Tramadol HCI 50 mg.   Hospital visits:  None  Medications: Outpatient Encounter Medications as of 06/20/2021  Medication Sig   albuterol (PROVENTIL) (2.5 MG/3ML) 0.083% nebulizer solution Take 3 mLs (2.5 mg total) by nebulization every 6 (six) hours as needed for wheezing or shortness of breath.   albuterol (VENTOLIN HFA) 108 (90 Base) MCG/ACT inhaler INHALE 2 PUFFS INTO THE LUNGS EVERY 6 HOURS AS NEEDED FOR WHEEZING OR SHORTNESS OF BREATH   aspirin EC 81 MG tablet Take 1 tablet (81 mg total) by mouth daily. Swallow whole.   Budeson-Glycopyrrol-Formoterol 160-9-4.8 MCG/ACT AERO Inhale 2 puffs into the lungs 2 (two) times daily as needed (shortness of breath).   buPROPion (WELLBUTRIN XL) 150 MG 24 hr tablet TAKE 1 TABLET(150 MG) BY MOUTH DAILY   cloNIDine (CATAPRES) 0.1 MG tablet Take 1 tablet (0.1 mg total) by mouth 2 (two) times daily.   diazepam (VALIUM) 5 MG tablet TAKE 1 TABLET BY MOUTH EVERY 12 HOURS AS NEEDED FOR ANXIETY. WATCH FOR SEDATION   isosorbide mononitrate (IMDUR) 30 MG 24 hr tablet Take 0.5 tablets (15 mg total) by mouth daily.   lisinopril-hydrochlorothiazide (ZESTORETIC) 20-12.5 MG tablet Take 2 tablets by mouth every morning.   nitroGLYCERIN (NITROSTAT) 0.4 MG SL tablet Place 1 tablet (0.4 mg total) under the tongue every 5 (five) minutes as needed for chest pain.   omeprazole (PRILOSEC) 40 MG capsule Take 1 capsule (40 mg total) by mouth daily.   ondansetron (ZOFRAN) 4 MG tablet Take 1 tablet (4 mg total) by mouth every 6 (six) hours as needed for nausea or vomiting.   oxyCODONE-acetaminophen (PERCOCET) 10-325 MG tablet Take 1 tablet by mouth  every 6 (six) hours as needed for pain.   promethazine (PHENERGAN) 25 MG tablet Take 1 tablet (25 mg total) by mouth every 6 (six) hours as needed for nausea or vomiting.   rosuvastatin (CRESTOR) 10 MG tablet Take 1 tablet (10 mg total) by mouth daily.   traZODone (DESYREL) 100 MG tablet TAKE 1 TABLET BY MOUTH EVERY DAY AT BEDTIME   No facility-administered encounter medications on file as of 06/20/2021.   Reviewed chart for medication changes ahead of medication coordination call.  No OVs, Consults, or hospital visits since last care coordination call/Pharmacist visit. (If appropriate, list visit date, provider name)  No medication changes indicated OR if recent visit, treatment plan here.  BP Readings from Last 3 Encounters:  06/08/21 136/86  05/05/21 122/80  03/16/21 (!) 142/93    Lab Results  Component Value Date   HGBA1C 5.3 05/05/2021     Patient obtains medications through Adherence Packaging  30 Days   Last adherence delivery included:  Asprin 81 mg 1 at breakfast Rosuvastatin 10 mg 1 at bedtime Bupropion XL 150 mg 1 at breakfast Clonidine 0.1 mg 1 at breakfast and 1 at Bedtime Lisinopril-HCTZ 20-12.5 mg 2 at breakfast Promethazine 25 mg 1 every 6 hours prn (Vials) Trazadone 100 mg 1 at bedtime prn (Vials) Omeprazole 40 mg 1 at breakfast  Isosorbide Monoitrate 30 mg 0.5 tablet at Breakfast   Patient declined (meds) last month  Vitamin D 5000 units 1 at breakfast  D/C by provider  Oxycodone-APAP 10-325 mg gets at [place] 5 mg 1 every 12 hours prn Gets at [facility]  Budesonide/glycopyrrl/formot 160-9-48 inhaler Pt still has plenty of supply. Only uses PRN  Albuterol inhaler 108 Pt still has plenty of supply. Only uses PRN    Patient is due for next adherence delivery on: 06/30/21. Called patient and reviewed medications and coordinated delivery.  This delivery to include: Rosuvastatin 10 mg 1 at bedtime Bupropion XL 150 mg 1 at breakfast Clonidine 0.1 mg  1 at breakfast and 1 at Bedtime Lisinopril-HCTZ 20-12.5 mg 2 at breakfast Trazodone 100 mg 1 at bedtime prn (Vials) Omeprazole 40 mg 1 at breakfast  Isosorbide Monoitrate 30 mg 0.5 tablet at Breakfast  Budesonide/glycopyrrl/formot 160-9-48 inhaler  Patient declined the following medications  Promethazine 25 mg 1 every 6 hours prn (Vials)- Only takes prn, has plenty of supply Asprin 81 mg 1 at breakfast- Pt still has a whole bottle left, did not need  Patient needs refills  None  Confirmed delivery date of 06/30/21, advised patient that pharmacy will contact them the morning of delivery.  Elray Mcgregor, [place] Pharmacist Assistant  [phone]

## 2021-06-21 NOTE — Telephone Encounter (Signed)
Compliant on meds 

## 2021-06-23 ENCOUNTER — Other Ambulatory Visit: Payer: Self-pay

## 2021-06-23 ENCOUNTER — Encounter: Payer: Self-pay | Admitting: Gastroenterology

## 2021-06-23 ENCOUNTER — Ambulatory Visit (INDEPENDENT_AMBULATORY_CARE_PROVIDER_SITE_OTHER): Payer: Medicare Other | Admitting: Gastroenterology

## 2021-06-23 VITALS — BP 138/70 | HR 76 | Ht 62.0 in | Wt 144.2 lb

## 2021-06-23 DIAGNOSIS — Z9889 Other specified postprocedural states: Secondary | ICD-10-CM | POA: Diagnosis not present

## 2021-06-23 DIAGNOSIS — G8929 Other chronic pain: Secondary | ICD-10-CM

## 2021-06-23 DIAGNOSIS — K449 Diaphragmatic hernia without obstruction or gangrene: Secondary | ICD-10-CM

## 2021-06-23 DIAGNOSIS — M549 Dorsalgia, unspecified: Secondary | ICD-10-CM | POA: Diagnosis not present

## 2021-06-23 DIAGNOSIS — K5909 Other constipation: Secondary | ICD-10-CM

## 2021-06-23 NOTE — Progress Notes (Signed)
Chief Complaint: N/V  Referring Provider:  Lillard Anes,*      ASSESSMENT AND PLAN;   #1.  Robotic assisted lap Nissen fundoplication with repair of paraesophageal hernia with mesh 02/22/2021. Neg CT A/P Feb 2022. Now with epi pain, ?N/V with wt loss.  #2. Chronic back pain followed by pain management (on percocet 1TID)  #3. Chronic constipation likely d/t narcotics.    #4. H/O polyps   Plan:  -UGI series with SB series -Ensure 2/day x 2 weeks then QD -Small but frequent meals.  Monitor weight. -Continue omeprazole 40 mg p.o. once a day for now.  -Minimize pain medications. -Refuses colonoscopy. -FU in 12 weeks.  Earlier if with problems.   HPI:    [name] [name] is a 76 y.o. female   FU after robotic assisted lap Nissen fundoplication with repair of paraesophageal hernia with mesh 02/22/2021.  According to the notes she had 360 degree wrap over 50 French bougie.  I do not see any preop manometry  Ever since surgery she complains of epigastric pain more so after eating.  Surprisingly she had few episodes of N/V.  Not able to burp much.  Has postprandial abdominal bloating.  Feels full really fast.  Has been losing weight as below.  Takes Ensure occasionally.  No dysphagia, No hearburn.  Wt Readings from Last 3 Encounters:  06/23/21 144 lb 4 oz (65.4 kg)  06/08/21 145 lb 12.8 oz (66.1 kg)  05/05/21 154 lb (69.9 kg)   No significant constipation Bms 1/QOD.  Has been using prunes 2/day.  No fever chills or night sweats.  No recent weight loss.    Previous records: ED at [facility].  Date of visit 03/20/2020 -Normal CBC with hemoglobin 14.4, MCV 90, platelets 210 -Normal CMP with BUN/creatinine 30/0.9 -CT Abdo/pelvis 03/20/2020: Extensive sigmoid diverticulosis without diverticulitis, moderate hiatal hernia, bilateral nonobstructing nephrolithiasis, degenerative joint disease of lumbar spine multiple level.  Aortic atherosclerosis.   Past GI  procedures:  EGD 09/22/2020 - Presbyesophagus - Schatzki ring s/p esophageal dilatation. - 2 cm hiatal hernia. - Mild atrophic gastritis.   CTA chest Abdo/pelvis on 07/22/2020: -No thoracic or abdominal aortic aneurysm -Colonic diverticulosis without diverticulitis. -Moderate hiatal hernia -Bilateral nonobstructing renal calculi.  CT AP 10/2016: Colonic diverticulosis without diverticulitis, small intrarenal stones, aortic atherosclerosis.  Advanced DJD back.  EGD 08/2015: Schatzki's ring s/p dilatation, small hiatal hernia. Neg eso Bx, neg CLO  Colonoscopy 08/2011 ([facility]) colonic polyps SP polypectomy, incidental small ascending colonic lipoma, pancolonic diverticulosis predominantly in the sigmoid colon. Bx- TA. Rpt in 5 yrs. Refused FU colon 08/31/2020  Past Medical History:  Diagnosis Date   Anxiety    Aortic atherosclerosis ([place])    Ascending aorta dilation ([place])    a.) TTE 09/02/19 --> mild; measured 68mm.   Asthma    CAD (coronary artery disease)    a,) CTA 08/24/20 --> mild CAD (25-49%) in RCA, LAD, LCx; CAC/Agatston score 509.   Cataract    Chronic left shoulder pain 09/27/2015   COPD (chronic obstructive pulmonary disease) (HCC)    Depression    Diastolic dysfunction    a.) TTE 09/02/19 --> LVEF 60-65%; G2DD. b.) TTE 07/23/20 --> LVEF 70-75%; G1DD.   GERD (gastroesophageal reflux disease)    History of kidney stones    Hyperlipidemia    Hypertension    Lupus (HCC)    Myocardial infarction ([place])    Osteoarthritis    PAC (premature atrial contraction)    a.)  Holter 12/04/19 --> occassional; 1.1% PAC burden.   Pain of right hip joint 11/22/2015   Paraesophageal hernia    Presbyesophagus    Primary osteoarthritis of left knee 02/04/2015   PSVT (paroxysmal supraventricular tachycardia) ([place])    a.) Holter 12/04/19 --> 22 runs.   Schatzki's ring    SOB (shortness of breath)    T2DM (type 2 diabetes mellitus) ([place])     Past Surgical History:  Procedure Laterality  Date   ABDOMINAL HYSTERECTOMY     BLADDER SUSPENSION     BREAST BIOPSY Right    COLONOSCOPY  09/04/2011   Colonic polyps, status post polyectomy. Incidental small ascending colon lipoma   ESOPHAGOGASTRODUODENOSCOPY  09/08/2015   Schatzki ring status post esophageal dilitation. Small hiatal hernia   hemorrhoid surgery     KNEE SURGERY     left   TOTAL KNEE ARTHROPLASTY Left 02/15/2015   Procedure: TOTAL KNEE ARTHROPLASTY;  Surgeon: Vickey Huger, MD;  Location: [place];  Service: Orthopedics;  Laterality: Left;   TUBAL LIGATION     WRIST SURGERY     right   XI ROBOTIC ASSISTED PARAESOPHAGEAL HERNIA REPAIR N/A 02/22/2021   Procedure: XI ROBOTIC ASSISTED PARAESOPHAGEAL HERNIA REPAIR with RNFA to assist;  Surgeon: Jules Husbands, MD;  Location: [facility];  Service: General;  Laterality: N/A;    Family History  Problem Relation Age of Onset   Stroke Mother    Heart disease Mother    COPD Father    Cancer Father        Bone   Diabetes Father    Heart disease Father    Stroke Father    Lupus Sister    Seizures Son    COPD Son    Colon cancer Neg Hx    Esophageal cancer Neg Hx    Stomach cancer Neg Hx    Rectal cancer Neg Hx     Social History   Tobacco Use   Smoking status: Every Day    Packs/day: 1.00    Years: 47.00    Pack years: 47.00    Types: Cigarettes   Smokeless tobacco: Never   Tobacco comments:    Smokes 1 pack daily 01/04/21 [facility]   Vaping Use   Vaping Use: Never used  Substance Use Topics   Alcohol use: No   Drug use: No    Current Outpatient Medications  Medication Sig Dispense Refill   albuterol (PROVENTIL) (2.5 MG/3ML) 0.083% nebulizer solution Take 3 mLs (2.5 mg total) by nebulization every 6 (six) hours as needed for wheezing or shortness of breath. 120 mL 6   albuterol (VENTOLIN HFA) 108 (90 Base) MCG/ACT inhaler INHALE 2 PUFFS INTO THE LUNGS EVERY 6 HOURS AS NEEDED FOR WHEEZING OR SHORTNESS OF BREATH 8.5 g 6   aspirin EC 81 MG tablet Take 1 tablet (81 mg  total) by mouth daily. Swallow whole. 90 tablet 3   Budeson-Glycopyrrol-Formoterol 160-9-4.8 MCG/ACT AERO Inhale 2 puffs into the lungs 2 (two) times daily as needed (shortness of breath). 10.7 g 2   buPROPion (WELLBUTRIN XL) 150 MG 24 hr tablet TAKE 1 TABLET(150 MG) BY MOUTH DAILY 90 tablet 2   cloNIDine (CATAPRES) 0.1 MG tablet Take 1 tablet (0.1 mg total) by mouth 2 (two) times daily. 180 tablet 3   diazepam (VALIUM) 5 MG tablet TAKE 1 TABLET BY MOUTH EVERY 12 HOURS AS NEEDED FOR ANXIETY. WATCH FOR SEDATION 60 tablet 3   isosorbide mononitrate (IMDUR) 30 MG 24 hr tablet  Take 0.5 tablets (15 mg total) by mouth daily. 45 tablet 3   lisinopril-hydrochlorothiazide (ZESTORETIC) 20-12.5 MG tablet Take 2 tablets by mouth every morning. 180 tablet 1   omeprazole (PRILOSEC) 40 MG capsule Take 1 capsule (40 mg total) by mouth daily. 90 capsule 1   ondansetron (ZOFRAN) 4 MG tablet Take 1 tablet (4 mg total) by mouth every 6 (six) hours as needed for nausea or vomiting. 20 tablet 0   promethazine (PHENERGAN) 25 MG tablet Take 1 tablet (25 mg total) by mouth every 6 (six) hours as needed for nausea or vomiting. 30 tablet 3   traZODone (DESYREL) 100 MG tablet TAKE 1 TABLET BY MOUTH EVERY DAY AT BEDTIME 90 tablet 2   nitroGLYCERIN (NITROSTAT) 0.4 MG SL tablet Place 1 tablet (0.4 mg total) under the tongue every 5 (five) minutes as needed for chest pain. 25 tablet 3   rosuvastatin (CRESTOR) 10 MG tablet Take 1 tablet (10 mg total) by mouth daily. 90 tablet 3   No current facility-administered medications for this visit.    Allergies  Allergen Reactions   Meperidine Anaphylaxis and Other (See Comments)    Blood pressure dropped, also   Influenza Vaccines Other (See Comments)    Flu-like symptoms   Other Rash and Other (See Comments)    asparagus    Review of Systems:  Constitutional: Denies fever, chills, diaphoresis, appetite change and fatigue.  HEENT: Denies photophobia, eye pain, redness, hearing  loss, ear pain, congestion, sore throat, rhinorrhea, sneezing, mouth sores, neck pain, neck stiffness and tinnitus.   Respiratory: Has COPD with continued smoking.  Occ SOB, DOE, cough, chest tightness,  and wheezing.   Cardiovascular: Denies chest pain, palpitations and leg swelling.  Genitourinary: Denies dysuria, urgency, frequency, hematuria, flank pain and difficulty urinating.  Musculoskeletal: Has myalgias, back pain, joint swelling, arthralgias and gait problem.  Skin: No rash.  Neurological: Denies dizziness, seizures, syncope, weakness, light-headedness, numbness and headaches.  Hematological: Denies adenopathy. Easy bruising, personal or family bleeding history  Psychiatric/Behavioral: Has anxiety or depression     Physical Exam:    BP 138/70    Pulse 76    Ht 5\' 2"  (1.575 m)    Wt 144 lb 4 oz (65.4 kg)    BMI 26.38 kg/m  Wt Readings from Last 3 Encounters:  06/23/21 144 lb 4 oz (65.4 kg)  06/08/21 145 lb 12.8 oz (66.1 kg)  05/05/21 154 lb (69.9 kg)   Constitutional:  Well-developed, in no acute distress. Psychiatric: Normal mood and affect. Behavior is normal. HEENT: Pupils normal.  Conjunctivae are normal. No scleral icterus. Cardiovascular: Normal rate, regular rhythm. No edema Pulmonary/chest: Bilateral decreased breath sounds.  No wheezing, rales or rhonchi. Abdominal: Soft, nondistended.  Generalized abdominal wall tenderness.. Bowel sounds active throughout. There are no masses palpable. No hepatomegaly. Rectal:  defered Neurological: Alert and oriented to person place and time. Skin: Skin is warm and dry. No rashes noted.  Data Reviewed: I have personally reviewed following labs and imaging studies  CBC: CBC Latest Ref Rng & Units 05/05/2021 02/23/2021 02/22/2021  WBC 3.4 - 10.8 x10E3/uL 6.2 9.3 5.4  Hemoglobin 11.1 - 15.9 g/dL 14.3 11.6(L) 13.0  Hematocrit 34.0 - 46.6 % 41.1 33.0(L) 38.2  Platelets 150 - 450 x10E3/uL 192 142(L) 137(L)    CMP: CMP Latest Ref  Rng & Units 05/05/2021 02/23/2021 02/22/2021  Glucose 70 - 99 mg/dL 86 144(H) -  BUN 8 - 27 mg/dL 8 14 -  Creatinine 0.57 -  1.00 mg/dL 0.69 0.79 0.84  Sodium 134 - 144 mmol/L 141 133(L) -  Potassium 3.5 - 5.2 mmol/L 4.0 3.5 -  Chloride 96 - 106 mmol/L 100 99 -  CO2 20 - 29 mmol/L 27 25 -  Calcium 8.7 - 10.3 mg/dL 9.9 8.8(L) -  Total Protein 6.0 - 8.5 g/dL 6.0 - -  Total Bilirubin 0.0 - 1.2 mg/dL 0.5 - -  Alkaline Phos 44 - 121 IU/L 66 - -  AST 0 - 40 IU/L 18 - -  ALT 0 - 32 IU/L 13 - -     Radiology Studies: No results found.     Carmell Austria, MD 06/23/2021, 4:21 PM  Cc: Lillard Anes

## 2021-06-23 NOTE — Patient Instructions (Addendum)
If you are age 76 or older, your body mass index should be between 23-30. Your Body mass index is 26.38 kg/m. If this is out of the aforementioned range listed, please consider follow up with your Primary Care Provider.  If you are age 29 or younger, your body mass index should be between 19-25. Your Body mass index is 26.38 kg/m. If this is out of the aformentioned range listed, please consider follow up with your Primary Care Provider.   __________________________________________________________  The [facility] providers would like to encourage you to use [facility] to communicate with providers for non-urgent requests or questions.  Due to long hold times on the telephone, sending your provider a message by [facility] may be a faster and more efficient way to get a response.  Please allow 48 business hours for a response.  Please remember that this is for non-urgent requests.    Due to recent changes in healthcare laws, you may see the results of your imaging and laboratory studies on MyChart before your provider has had a chance to review them.  We understand that in some cases there may be results that are confusing or concerning to you. Not all laboratory results come back in the same time frame and the provider may be waiting for multiple results in order to interpret others.  Please give [place] 48 hours in order for your provider to thoroughly review all the results before contacting the office for clarification of your results.  -Drink Insure two time daily for 2 weeks then drink one everyday. -Eat Small but freaquent meals. -Continue taking omeprazole 40 mg p.o. once a day.  -Minimize pain medications.   You have been scheduled for an Upper GI Series and Small Bowel Follow Thru at [facility] Your appointment is on 07/04/2021 at 9:30am. Please arrive 30 minutes prior to your test for registration. Make certain not to have anything to eat or drink after midnight on the night before your test.  If you need to reschedule, please contact radiology at [phone]. --------------------------------------------------------------------------------------------------------------- An upper GI series uses x rays to help diagnose problems of the upper GI tract, which includes the esophagus, stomach, and duodenum. The duodenum is the first part of the small intestine. An upper GI series is conducted by a radiology technologist or a radiologist--a doctor who specializes in x-ray imaging--at a hospital or outpatient center. While sitting or standing in front of an x-ray machine, the patient drinks barium liquid, which is often white and has a chalky consistency and taste. The barium liquid coats the lining of the upper GI tract and makes signs of disease show up more clearly on x rays. X-ray video, called fluoroscopy, is used to view the barium liquid moving through the esophagus, stomach, and duodenum. Additional x rays and fluoroscopy are performed while the patient lies on an x-ray table. To fully coat the upper GI tract with barium liquid, the technologist or radiologist may press on the abdomen or ask the patient to change position. Patients hold still in various positions, allowing the technologist or radiologist to take x rays of the upper GI tract at different angles. If a technologist conducts the upper GI series, a radiologist will later examine the images to look for problems.  This test typically takes about 1 hour to complete --------------------------------------------------------------------------------------------------------------------------------------------- The Small Bowel Follow Thru examination is used to visualize the entire small bowel (intestines); specifically the connection between the small and large intestine. You will be positioned on a  flat x-ray table and an image of your abdomen taken. Then the technologist will show the x-ray to the radiologist. The radiologist will instruct  your technologist how much (1-2 cups) barium sulfate you will drink and when to begin taking the timed x-rays, usually 15-30 minutes after you begin drinking. Barium is a harmless substance that will highlight your small intestine by absorbing x-ray. The taste is chalky and it feels very heavy both in the cup and in your stomach.  After the first x-ray is taken and shown to the radiologist, he/she will determine when the next image is to be taken. This is repeated until the barium has reached the end of the small intestine and enters the beginning of the colon (cecum). At such time when the barium spills into the colon, you will be positioned on the x-ray table once again. The radiologist will use a fluoroscopic camera to take some detailed pictures of the connection between your small intestine and colon. The fluoroscope is an x-ray unit that works with a television/computer screen. The radiologist will apply pressure to your abdomen with his/her hand and a lead glove, a plastic paddle, or a paddle with an inflated rubber balloon on the end. This is to spread apart your loops of intestine so he/she can see all areas.   This test typically takes around 1 hour to complete.  Important Drink plenty of water (8-10 cups/day) for a few days following the procedure to avoid constipation and blockage. The barium will make your stools white for a few days. --------------------------------------------------------------------------------------------------------------------------------------------

## 2021-06-24 ENCOUNTER — Ambulatory Visit: Admission: RE | Admit: 2021-06-24 | Payer: Medicare Other | Source: Ambulatory Visit

## 2021-06-29 ENCOUNTER — Ambulatory Visit: Payer: Medicare Other | Admitting: Surgery

## 2021-07-04 ENCOUNTER — Other Ambulatory Visit: Payer: Self-pay

## 2021-07-04 ENCOUNTER — Ambulatory Visit (HOSPITAL_COMMUNITY)
Admission: RE | Admit: 2021-07-04 | Discharge: 2021-07-04 | Disposition: A | Discharge status: Home / Self Care | Payer: Medicare Other | Source: Ambulatory Visit | Attending: Gastroenterology | Admitting: Gastroenterology

## 2021-07-04 DIAGNOSIS — Z9889 Other specified postprocedural states: Secondary | ICD-10-CM | POA: Diagnosis not present

## 2021-07-04 DIAGNOSIS — G8929 Other chronic pain: Secondary | ICD-10-CM

## 2021-07-04 DIAGNOSIS — M549 Dorsalgia, unspecified: Secondary | ICD-10-CM | POA: Diagnosis not present

## 2021-07-04 DIAGNOSIS — K224 Dyskinesia of esophagus: Secondary | ICD-10-CM | POA: Diagnosis not present

## 2021-07-04 DIAGNOSIS — R140 Abdominal distension (gaseous): Secondary | ICD-10-CM | POA: Diagnosis not present

## 2021-07-04 DIAGNOSIS — K5909 Other constipation: Secondary | ICD-10-CM | POA: Diagnosis not present

## 2021-07-04 DIAGNOSIS — K449 Diaphragmatic hernia without obstruction or gangrene: Secondary | ICD-10-CM | POA: Diagnosis not present

## 2021-07-07 ENCOUNTER — Telehealth: Payer: Self-pay | Admitting: Gastroenterology

## 2021-07-07 NOTE — Telephone Encounter (Signed)
Inbound call from patient requesting results from CT on 2/6. Please advise.

## 2021-07-07 NOTE — Telephone Encounter (Signed)
Pt was made aware that Dr. Lyndel Safe has not reviewed the results of her UPPER GI SERIES WITH SMALL BOWEL FOLLOW-THROUGH that she recently had. Pt was made aware that as soon as Dr. Lyndel Safe reviews the results then we will reach out to her Pt verbalized understanding with all questions answered.

## 2021-07-19 ENCOUNTER — Telehealth: Payer: Self-pay

## 2021-07-19 NOTE — Chronic Care Management (AMB) (Signed)
Chronic Care Management Pharmacy Assistant   Name: [name] [name]  MRN: [id number] DOB: [birth year]   Reason for Encounter: Medication Coordination for Upstream    Recent office visits:  None  Recent consult visits:  06/23/21 (Gastroenterology) Jackquline Denmark MD. Seen for Chronic Constipation. D/C oxycodone-apap 10.325mg .   Hospital visits:  None  Medications: Outpatient Encounter Medications as of 07/19/2021  Medication Sig   albuterol (PROVENTIL) (2.5 MG/3ML) 0.083% nebulizer solution Take 3 mLs (2.5 mg total) by nebulization every 6 (six) hours as needed for wheezing or shortness of breath.   albuterol (VENTOLIN HFA) 108 (90 Base) MCG/ACT inhaler INHALE 2 PUFFS INTO THE LUNGS EVERY 6 HOURS AS NEEDED FOR WHEEZING OR SHORTNESS OF BREATH   aspirin EC 81 MG tablet Take 1 tablet (81 mg total) by mouth daily. Swallow whole.   Budeson-Glycopyrrol-Formoterol 160-9-4.8 MCG/ACT AERO Inhale 2 puffs into the lungs 2 (two) times daily as needed (shortness of breath).   buPROPion (WELLBUTRIN XL) 150 MG 24 hr tablet TAKE 1 TABLET(150 MG) BY MOUTH DAILY   cloNIDine (CATAPRES) 0.1 MG tablet Take 1 tablet (0.1 mg total) by mouth 2 (two) times daily.   diazepam (VALIUM) 5 MG tablet TAKE 1 TABLET BY MOUTH EVERY 12 HOURS AS NEEDED FOR ANXIETY. WATCH FOR SEDATION   isosorbide mononitrate (IMDUR) 30 MG 24 hr tablet Take 0.5 tablets (15 mg total) by mouth daily.   lisinopril-hydrochlorothiazide (ZESTORETIC) 20-12.5 MG tablet Take 2 tablets by mouth every morning.   nitroGLYCERIN (NITROSTAT) 0.4 MG SL tablet Place 1 tablet (0.4 mg total) under the tongue every 5 (five) minutes as needed for chest pain.   omeprazole (PRILOSEC) 40 MG capsule Take 1 capsule (40 mg total) by mouth daily.   ondansetron (ZOFRAN) 4 MG tablet Take 1 tablet (4 mg total) by mouth every 6 (six) hours as needed for nausea or vomiting.   promethazine (PHENERGAN) 25 MG tablet Take 1 tablet (25 mg total) by mouth every 6 (six) hours as  needed for nausea or vomiting.   rosuvastatin (CRESTOR) 10 MG tablet Take 1 tablet (10 mg total) by mouth daily.   traZODone (DESYREL) 100 MG tablet TAKE 1 TABLET BY MOUTH EVERY DAY AT BEDTIME   No facility-administered encounter medications on file as of 07/19/2021.    Reviewed chart for medication changes ahead of medication coordination call.  No OVs, or hospital visits since last care coordination call/Pharmacist visit.   No medication changes indicated OR if recent visit, treatment plan here.  BP Readings from Last 3 Encounters:  06/23/21 138/70  06/08/21 136/86  05/05/21 122/80    Lab Results  Component Value Date   HGBA1C 5.3 05/05/2021     Patient obtains medications through Adherence Packaging  30 Days   Last adherence delivery included:  Rosuvastatin 10 mg 1 at bedtime Bupropion XL 150 mg 1 at breakfast Clonidine 0.1 mg 1 at breakfast and 1 at Bedtime Lisinopril-HCTZ 20-12.5 mg 2 at breakfast Trazodone 100 mg 1 at bedtime prn (Vials) Omeprazole 40 mg 1 at breakfast  Isosorbide Monoitrate 30 mg 0.5 tablet at Breakfast  Budesonide/glycopyrrl/formot 160-9-48 inhaler  Patient declined (meds) last month  Promethazine 25 mg 1 every 6 hours prn (Vials)- Only takes prn, has plenty of supply Asprin 81 mg 1 at breakfast- Pt still has a whole bottle left, did not need  Patient is due for next adherence delivery on: 08/01/21. Called patient and reviewed medications and coordinated delivery.  This delivery to include: sosorbide Monoitrate 30  mg 0.5 tablet at Breakfast  Budesonide/glycopyrrl/formot 160-9-48 inhaler Rosuvastatin 10 mg 1 at bedtime Clonidine 0.1 mg 1 at breakfast and 1 at Bedtime Bupropion XL 150 mg 1 at breakfast Trazodone 100 mg 1 at bedtime prn (Vials) Lisinopril-HCTZ 20-12.5 mg 2 at breakfast Omeprazole 40 mg 1 at breakfast  Patient declined the following medications (meds) due to (reason)  Patient needs refills  None  Unable to reach pt after  several attempts    Elray Mcgregor, [place] Pharmacist Assistant  [phone]

## 2021-07-22 NOTE — Telephone Encounter (Signed)
Compliant on meds 

## 2021-08-09 ENCOUNTER — Telehealth: Payer: Self-pay

## 2021-08-09 NOTE — Progress Notes (Signed)
Called pt to remind them of their appt with CPP on 3/16 but no answer or vm. ? ?Elray Mcgregor, CMA ?Clinical Pharmacist Assistant  ?[phone]  ?

## 2021-08-11 ENCOUNTER — Other Ambulatory Visit: Payer: Self-pay | Admitting: Legal Medicine

## 2021-08-11 ENCOUNTER — Ambulatory Visit (INDEPENDENT_AMBULATORY_CARE_PROVIDER_SITE_OTHER): Payer: Medicare Other

## 2021-08-11 ENCOUNTER — Other Ambulatory Visit: Payer: Self-pay

## 2021-08-11 MED ORDER — ARIPIPRAZOLE 2 MG PO TABS: 2.0000 mg | ORAL_TABLET | Freq: Every day | ORAL | 3 refills | Status: DC

## 2021-08-11 NOTE — Progress Notes (Signed)
? ?Chronic Care Management ?Pharmacy Note ? ?08/11/2021 ?Name:  [name] [name] MRN:  [id number] DOB:  10/23/1945  ? ?Plan Recommendations:  ?Please consider rechecking vitamin D with next labs. Patient states she completed high dose vitamin D regimen but has not continued to supplement.  ?Recommend DEXA scan as well ?Patient's mental health is not controlled. Bupropion 154m isn't helping for her, could we send in something stronger for the patient? ?Update: Patient's PHQ9 is now in the 20's and she's only sleeping 3 hours/night. Recommend increasing therapy ?Could we consider increasing her statin in the future due to her risk factors ? ?Subjective: ?[name] MARCOEis an 76y.o. year old female who is a primary patient of PHenrene Pastor LZeb Comfort MD.  The CCM team was consulted for assistance with disease management and care coordination needs.   ? ?Engaged with patient by telephone for follow up visit in response to provider referral for pharmacy case management and/or care coordination services.  ? ?Consent to Services:  ?The patient was given the following information about Chronic Care Management services today, agreed to services, and gave verbal consent: 1. CCM service includes personalized support from designated clinical staff supervised by the primary care provider, including individualized plan of care and coordination with other care providers 2. 24/7 contact phone numbers for assistance for urgent and routine care needs. 3. Service will only be billed when office clinical staff spend 20 minutes or more in a month to coordinate care. 4. Only one practitioner may furnish and bill the service in a calendar month. 5.The patient may stop CCM services at any time (effective at the end of the month) by phone call to the office staff. 6. The patient will be responsible for cost sharing (co-pay) of up to 20% of the service fee (after annual deductible is met). Patient agreed to services and consent obtained. ? ?Patient  Care Team: ?PLillard Anes MD as PCP - General (Family Medicine) ?TBerniece Salines DO as PCP - Cardiology (Cardiology) ?KLane Hacker [facility] Pharmacist (Pharmacist) ? ?Recent office visits: ?07/14/2020 - CBC normal, kidney and liver tests normal, A1c 5.1 OK, LDL cholesterol 100.  ?04/15/2020 - depression and grief.  ?03/30/2020 - gastroenteritis - promethazine and depression.  ?Recent consult visits: ?08/04/2020 - cardiology - symptoms persist recommend proceeding with Coronary CTA. Smoking cessation available.  ?08/02/2020 - Pulmonology - full set of PFTs at follow-up. Continue Breztri and albuterol.  ?Hospital visits: ? 07/22/2020 - ed to hospital admission for chest pain.  ?03/20/2020 - ED visit   ? ?Objective: ? ?Lab Results  ?Component Value Date  ? CREATININE 0.69 05/05/2021  ? BUN 8 05/05/2021  ? GFRNONAA >60 02/23/2021  ? GFRAA 100 07/14/2020  ? NA 141 05/05/2021  ? K 4.0 05/05/2021  ? CALCIUM 9.9 05/05/2021  ? CO2 27 05/05/2021  ? GLUCOSE 86 05/05/2021  ? ? ?Lab Results  ?Component Value Date/Time  ? HGBA1C 5.3 05/05/2021 02:53 PM  ? HGBA1C 5.1 07/14/2020 02:42 PM  ?  ?Last diabetic Eye exam: No results found for: HMDIABEYEEXA  ?Last diabetic Foot exam: No results found for: HMDIABFOOTEX  ? ?Lab Results  ?Component Value Date  ? CHOL 143 05/05/2021  ? HDL 64 05/05/2021  ? LPickens66 05/05/2021  ? TRIG 62 05/05/2021  ? CHOLHDL 2.2 05/05/2021  ? ? ?Hepatic Function Latest Ref Rng & Units 05/05/2021 01/18/2021 07/22/2020  ?Total Protein 6.0 - 8.5 g/dL 6.0 6.5 6.2(L)  ?Albumin 3.7 - 4.7 g/dL 4.2 4.3  3.6  ?AST 0 - 40 IU/L 18 17 16   ?ALT 0 - 32 IU/L 13 16 13   ?Alk Phosphatase 44 - 121 IU/L 66 89 64  ?Total Bilirubin 0.0 - 1.2 mg/dL 0.5 0.4 0.6  ? ? ?No results found for: TSH, FREET4 ? ?CBC Latest Ref Rng & Units 05/05/2021 02/23/2021 02/22/2021  ?WBC 3.4 - 10.8 x10E3/uL 6.2 9.3 5.4  ?Hemoglobin 11.1 - 15.9 g/dL 14.3 11.6(L) 13.0  ?Hematocrit 34.0 - 46.6 % 41.1 33.0(L) 38.2  ?Platelets 150 - 450 x10E3/uL 192  142(L) 137(L)  ? ? ?Lab Results  ?Component Value Date/Time  ? VD25OH 17.3 (L) 12/10/2019 03:34 PM  ? ? ?Clinical ASCVD: No  ?The 10-year ASCVD risk score (Arnett DK, et al., 2019) is: 53.3% ?  Values used to calculate the score: ?    Age: 41 years ?    Sex: Female ?    Is Non-Hispanic African American: No ?    Diabetic: Yes ?    Tobacco smoker: Yes ?    Systolic Blood Pressure: 381 mmHg ?    Is BP treated: Yes ?    HDL Cholesterol: 64 mg/dL ?    Total Cholesterol: 143 mg/dL   ? ?Depression screen [facility] 2/9 08/11/2021 05/05/2021 01/18/2021  ?Decreased Interest 1 1 2   ?Down, Depressed, Hopeless 3 2 3   ?PHQ - 2 Score 4 3 5   ?Altered sleeping 3 1 3   ?Tired, decreased energy 3 3 3   ?Change in appetite 3 2 1   ?Feeling bad or failure about yourself  3 1 0  ?Trouble concentrating 3 3 3   ?Moving slowly or fidgety/restless 3 3 2   ?Suicidal thoughts 0 0 0  ?PHQ-9 Score 22 16 17   ?Difficult doing work/chores Very difficult Somewhat difficult Somewhat difficult  ?Some recent data might be hidden  ?  ? ?Social History  ? ?Tobacco Use  ?Smoking Status Every Day  ? Packs/day: 1.00  ? Years: 47.00  ? Pack years: 47.00  ? Types: Cigarettes  ?Smokeless Tobacco Never  ?Tobacco Comments  ? Smokes 1 pack daily 01/04/21 ARJ   ? ?BP Readings from Last 3 Encounters:  ?06/23/21 138/70  ?06/08/21 136/86  ?05/05/21 122/80  ? ?Pulse Readings from Last 3 Encounters:  ?06/23/21 76  ?06/08/21 61  ?05/05/21 74  ? ?Wt Readings from Last 3 Encounters:  ?06/23/21 144 lb 4 oz (65.4 kg)  ?06/08/21 145 lb 12.8 oz (66.1 kg)  ?05/05/21 154 lb (69.9 kg)  ? ?BMI Readings from Last 3 Encounters:  ?06/23/21 26.38 kg/m?  ?06/08/21 26.67 kg/m?  ?05/05/21 27.28 kg/m?  ? ? ?Assessment/Interventions: Review of patient past medical history, allergies, medications, health status, including review of consultants reports, laboratory and other test data, was performed as part of comprehensive evaluation and provision of chronic care management services.  ? ?SDOH:  (Social  Determinants of Health) assessments and interventions performed: Yes ?SDOH Interventions   ? ?Flowsheet Row Most Recent Value  ?SDOH Interventions   ?Depression Interventions/Treatment  Medication  ? ?  ? ?SDOH Screenings  ? ?Alcohol Screen: Low Risk   ? Last Alcohol Screening Score (AUDIT): 0  ?Depression (PHQ2-9): Medium Risk  ? PHQ-2 Score: 22  ?Financial Resource Strain: Low Risk   ? Difficulty of Paying Living Expenses: Not hard at all  ?Food Insecurity: No Food Insecurity  ? Worried About Charity fundraiser in the Last Year: Never true  ? Ran Out of Food in the Last Year: Never true  ?Housing: Low  Risk   ? Last Housing Risk Score: 0  ?Physical Activity: Insufficiently Active  ? Days of Exercise per Week: 2 days  ? Minutes of Exercise per Session: 20 min  ?Social Connections: Socially Isolated  ? Frequency of Communication with Friends and Family: Three times a week  ? Frequency of Social Gatherings with Friends and Family: Never  ? Attends Religious Services: Never  ? Active Member of Clubs or Organizations: No  ? Attends Archivist Meetings: Never  ? Marital Status: Widowed  ?Stress: Stress Concern Present  ? Feeling of Stress : To some extent  ?Tobacco Use: High Risk  ? Smoking Tobacco Use: Every Day  ? Smokeless Tobacco Use: Never  ? Passive Exposure: Not on file  ?Transportation Needs: Unmet Transportation Needs  ? Lack of Transportation (Medical): Yes  ? Lack of Transportation (Non-Medical): Yes  ? ? ?[place] ? ?Allergies  ?Allergen Reactions  ? Meperidine Anaphylaxis and Other (See Comments)  ?  Blood pressure dropped, also  ? Influenza Vaccines Other (See Comments)  ?  Flu-like symptoms  ? Other Rash and Other (See Comments)  ?  asparagus  ? ? ?Medications Reviewed Today   ? ? Reviewed by Lane Hacker, [facility] (Pharmacist) on 07/22/21 at [phone]  Med List Status: <None>  ? ?Medication Order Taking? Sig Documenting Provider Last Dose Status Informant  ?albuterol (PROVENTIL) (2.5 MG/3ML)  0.083% nebulizer solution [id number]  Take 3 mLs (2.5 mg total) by nebulization every 6 (six) hours as needed for wheezing or shortness of breath. Spero Geralds, MD  Active Self  ?albuterol (VENTOLIN HFA

## 2021-08-11 NOTE — Patient Instructions (Signed)
Visit Information ? ? Goals Addressed   ?None ?  ? ?Patient Care Plan: [place]  ?  ? ?Problem Identified: htn, hld, copd   ?Priority: High  ?Onset Date: 09/03/2020  ?  ? ?Long-Range Goal: Disease Management   ?Start Date: 09/03/2020  ?Expected End Date: 09/03/2021  ?Recent Progress: On track  ?Priority: High  ?Note:   ? ?Interventions: ?1:1 collaboration with Lillard Anes, MD regarding development and update of comprehensive plan of care as evidenced by provider attestation and co-signature ?Inter-disciplinary care team collaboration (see longitudinal plan of care) ?Comprehensive medication review performed; medication list updated in electronic medical record ? ?Hypertension (BP goal <140/90) ?-Controlled ?-Current treatment: ?lisinopril-hydrochlorothiazide 20-12.5 mg 2 tablets daily Appropriate, Effective, Safe, Accessible ?Clonidine 0.1 mg bid Appropriate, Effective, Safe, Accessible ?ISMN '30mg'$  take 1/2 QD Appropriate, Effective, Safe, Accessible ?-Medications previously tried:  amlodipine, hydrochlorothiazide, furosemide,  ?-Current home readings:  has been good at home. Typically checks weekly.  ?-Current dietary habits: trying to eat heart healthy and lower sodium.  ?-Current exercise habits:  has done home PT before. She knows how to do chair exercises and does them at home 1-2 times daily.  ?-Denies hypotensive/hypertensive symptoms ?-Educated on BP goals and benefits of medications for prevention of heart attack, stroke and kidney damage; ?Daily salt intake goal < 2300 mg; ?Exercise goal of 150 minutes per week; ?Importance of home blood pressure monitoring; ?-Counseled to monitor BP at home weekly, document, and provide log at future appointments ?-Counseled on diet and exercise extensively ?Recommended to continue current medication ?Recommended checking blood pressure weekly and recording.  ? ?CAD: (LDL goal < 100) ?The 10-year ASCVD risk score (Arnett DK, et al., 2019) is: 53.3% ?   Values used to calculate the score: ?    Age: 54 years ?    Sex: Female ?    Is Non-Hispanic African American: No ?    Diabetic: Yes ?    Tobacco smoker: Yes ?    Systolic Blood Pressure: 631 mmHg ?    Is BP treated: Yes ?    HDL Cholesterol: 64 mg/dL ?    Total Cholesterol: 143 mg/dL ?Lab Results  ?Component Value Date  ? CHOL 143 05/05/2021  ? CHOL 152 01/18/2021  ? CHOL 167 07/14/2020  ? ?Lab Results  ?Component Value Date  ? HDL 64 05/05/2021  ? HDL 61 01/18/2021  ? HDL 57 07/14/2020  ? ?Lab Results  ?Component Value Date  ? [place] 66 05/05/2021  ? [place] 80 01/18/2021  ? LDLCALC 100 (H) 07/14/2020  ? ?Lab Results  ?Component Value Date  ? TRIG 62 05/05/2021  ? TRIG 53 01/18/2021  ? TRIG 51 07/14/2020  ? ?Lab Results  ?Component Value Date  ? CHOLHDL 2.2 05/05/2021  ? CHOLHDL 2.5 01/18/2021  ? CHOLHDL 2.9 07/14/2020  ?No results found for: LDLDIRECT ?-Controlled ?-Current treatment: ?Rosuvastatin 10 mg daily Appropriate, Effective, Safe, Accessible ?ASA '81mg'$  Appropriate, Effective, Safe, Accessible ?-Medications previously tried: lovastatin, atorvastatin ?-Current dietary patterns:  trying to abide by heart healthy and lower sodium. Doesn't eat breakfast. [place] with fruit for lunch. Drinking Ensure. Eats oatmeal. Frozen chicken nuggets and fries.  ?-Current exercise habits: chair exercises 1-2 times daily  ?-Educated on Cholesterol goals;  ?Benefits of statin for ASCVD risk reduction; ?Importance of limiting foods high in cholesterol; ?Exercise goal of 150 minutes per week; ?-Counseled on diet and exercise extensively ?September 2022: Candidate for higher statin with risk factors ?March 2023: Candidate for  higher intensity statin ? ?COPD (Goal: control symptoms and prevent exacerbations) ?CAT Score 08/02/2020  ?Total CAT Score 28  ? ?PF Readings from Last 3 Encounters:  ?No data found for PF  ?-Not ideally controlled ?-Current treatment  ?albuterol nebulizer solution 3 mls every 6 hours prn wheezing or  shortness of breath Appropriate, Effective, Safe, Accessible ?Albuterol inhaler 2 puffs into lungs every 6 hours prn wheezing/shortness of breath Appropriate, Effective, Safe, Accessible ?Breztri 160/9/4.8 mcg/act 2 puffs into the lungs am and bedtime Appropriate, Effective, Safe, Accessible ?-Medications previously tried: none reported  ?-Pulmonary function testing: ordered with pulmonology for 10/2020 ?-Frequency of rescue inhaler use: daily with exertion ?-Counseled on [place] inhaler technique; ?Benefits of consistent maintenance inhaler use ?smoking cessation.  ?-Counseled on diet and exercise extensively ?Recommended to continue current medication ?Counseled on smoking cessation. Smoking 1.5 packs per day.  ?March 2023: Patient's main priorities today were insomnia/mood, will push back COPD assessment to f/u appt ? ?Depression/Anxiety (Goal: manage symptoms of depression and anxiety) ?-Controlled ?-Current treatment: ?bupropion xl 150 mg daily Appropriate, Query effective, Safe, Accessible ?Diazepam 5 mg every 12 hours prn anxiety Query Appropriate, Query effective, Query Safe, Query accessible ?-Medications previously tried/failed: [place], sertraline, trazodone ?Depression screen [facility] 2/9 08/11/2021 05/05/2021 01/18/2021  ?Decreased Interest '1 1 2  '$ ?Down, Depressed, Hopeless '3 2 3  '$ ?PHQ - 2 Score '4 3 5  '$ ?Altered sleeping '3 1 3  '$ ?Tired, decreased energy '3 3 3  '$ ?Change in appetite '3 2 1  '$ ?Feeling bad or failure about yourself  3 1 0  ?Trouble concentrating '3 3 3  '$ ?Moving slowly or fidgety/restless '3 3 2  '$ ?Suicidal thoughts 0 0 0  ?PHQ-9 Score '22 16 17  '$ ?Difficult doing work/chores Very difficult Somewhat difficult Somewhat difficult  ?Some recent data might be hidden  ? ?GAD 7 : Generalized Anxiety Score 09/03/2020  ?Nervous, Anxious, on Edge 2  ?Control/stop worrying 2  ?Worry too much - different things 2  ?Trouble relaxing 1  ?Restless 1  ?Easily annoyed or irritable 1  ?Afraid - awful might happen 1  ?Total GAD 7  Score 10  ?Anxiety Difficulty Somewhat difficult  ?-Educated on Benefits of medication for symptom control ?-Counseled on diet and exercise extensively ?September 2022: Will ask PCP about increasing therapy ?March 2023: Patient's son passed November 2022, is unable to sleep at night because mind is racing. PHQ9 has increased. Will try to coordinate with PCP to increase therapy ? ?Insomnia (Goal: 7-8 hours of sleep/night) ?-Uncontrolled ?-Patient currently getting 3 hours of sleep/night ?-Patient currently waking up 0 times/night ?-Current treatment: ?Trazodone '100mg'$  HS Appropriate, Query effective, Safe, Accessible ?-Medications previously tried: N/A ?-Counseled on sleep hygiene techniques (consistent sleep/wake up schedule, no electronic screens 1-2 hours before bed, etc) ?March 2023: Patient getting 3 hours of sleep/night. Goes to bed at 2000 and watches TV for a few hours until 2230 in bed. Is unable to sleep until 0300. Counsled on sleep hygiene (Will watch TV in living room and colour for 30 mins before bed). Patient states she can't sleep because her mind is racing (Son passed November 2022), will coordinate with PCP to increase mental health therapy ? ? ?GERD (Goal: manage symptoms of GERD) ?-Not ideally controlled ?-Current treatment  ?Omeprazole 40 mg daily Appropriate, Effective, Safe, Accessible ?Promethazine 25 mg every 6 hours prn nausea/vomiting Appropriate, Effective, Safe, Accessible ?-Medications previously tried:  Linzess, metoclopramide,  ?-Counseled on diet and exercise extensively ?Recommended to continue current medication ?September 2022: Just had Hiatal  Hernia Sx, defer to specialist ? ?Tobacco use (Goal reduce cigarette use) ?-Uncontrolled ?-Previous quit attempts: none reported ?-Current treatment  ?Patient take bupropion for depression and is not ready to quit at this time  ?-Patient smokes Within 30 minutes of waking ?-Patient triggers include: stress, anxiety and sadness and craving the  taste of a cigarette and needing to do something with your hands/mouth ?-On a scale of 1-10, reports MOTIVATION to quit is 0 ?-On a scale of 1-10, reports CONFIDENCE in quitting is 0 ?-Provided contact

## 2021-08-18 ENCOUNTER — Other Ambulatory Visit: Payer: Self-pay | Admitting: Legal Medicine

## 2021-08-18 ENCOUNTER — Telehealth: Payer: Self-pay

## 2021-08-18 DIAGNOSIS — J410 Simple chronic bronchitis: Secondary | ICD-10-CM

## 2021-08-18 MED ORDER — ALBUTEROL SULFATE (2.5 MG/3ML) 0.083% IN NEBU: 2.5000 mg | INHALATION_SOLUTION | Freq: Four times a day (QID) | RESPIRATORY_TRACT | 6 refills | Status: DC | PRN

## 2021-08-18 NOTE — Progress Notes (Signed)
? ? ?Chronic Care Management ?Pharmacy Assistant  ? ?Name: [name] [name]  MRN: [id number] DOB: Jul 16, 1945 ? ? ? ?Reason for Encounter: Medication Coordination for [facility]  ?  ?Recent office visits:  ?08/11/21 [name] MD. Orders Only. Ordered Ablify '2mg'$  daily.  ? ?Recent consult visits:  ?None ? ?Hospital visits:  ?None ? ?Medications: ?Outpatient Encounter Medications as of 08/18/2021  ?Medication Sig  ? ARIPiprazole (ABILIFY) 2 MG tablet Take 1 tablet (2 mg total) by mouth daily. Still take wellbutrin  ? albuterol (PROVENTIL) (2.5 MG/3ML) 0.083% nebulizer solution Take 3 mLs (2.5 mg total) by nebulization every 6 (six) hours as needed for wheezing or shortness of breath.  ? albuterol (VENTOLIN HFA) 108 (90 Base) MCG/ACT inhaler INHALE 2 PUFFS INTO THE LUNGS EVERY 6 HOURS AS NEEDED FOR WHEEZING OR SHORTNESS OF BREATH  ? aspirin EC 81 MG tablet Take 1 tablet (81 mg total) by mouth daily. Swallow whole.  ? Budeson-Glycopyrrol-Formoterol 160-9-4.8 MCG/ACT AERO Inhale 2 puffs into the lungs 2 (two) times daily as needed (shortness of breath).  ? buPROPion (WELLBUTRIN XL) 150 MG 24 hr tablet TAKE 1 TABLET(150 MG) BY MOUTH DAILY  ? cloNIDine (CATAPRES) 0.1 MG tablet Take 1 tablet (0.1 mg total) by mouth 2 (two) times daily.  ? diazepam (VALIUM) 5 MG tablet TAKE 1 TABLET BY MOUTH EVERY 12 HOURS AS NEEDED FOR ANXIETY. WATCH FOR SEDATION  ? isosorbide mononitrate (IMDUR) 30 MG 24 hr tablet Take 0.5 tablets (15 mg total) by mouth daily.  ? lisinopril-hydrochlorothiazide (ZESTORETIC) 20-12.5 MG tablet Take 2 tablets by mouth every morning.  ? nitroGLYCERIN (NITROSTAT) 0.4 MG SL tablet Place 1 tablet (0.4 mg total) under the tongue every 5 (five) minutes as needed for chest pain.  ? omeprazole (PRILOSEC) 40 MG capsule Take 1 capsule (40 mg total) by mouth daily.  ? ondansetron (ZOFRAN) 4 MG tablet Take 1 tablet (4 mg total) by mouth every 6 (six) hours as needed for nausea or vomiting.  ? promethazine (PHENERGAN) 25 MG  tablet Take 1 tablet (25 mg total) by mouth every 6 (six) hours as needed for nausea or vomiting.  ? rosuvastatin (CRESTOR) 10 MG tablet Take 1 tablet (10 mg total) by mouth daily.  ? traZODone (DESYREL) 100 MG tablet TAKE 1 TABLET BY MOUTH EVERY DAY AT BEDTIME  ? ?No facility-administered encounter medications on file as of 08/18/2021.  ? ? ?Reviewed chart for medication changes ahead of medication coordination call. ? ?No OVs, Consults, or hospital visits since last care coordination call/Pharmacist visit.  ? ?BP Readings from Last 3 Encounters:  ?06/23/21 138/70  ?06/08/21 136/86  ?05/05/21 122/80  ?  ?Lab Results  ?Component Value Date  ? HGBA1C 5.3 05/05/2021  ?  ? ?Patient obtains medications through Adherence Packaging  30 Days  ? ?Last adherence delivery included: ?Isosorbide Monoitrate 30 mg 0.5 tablet at Breakfast  ?Budesonide/glycopyrrl/formot 160-9-48 inhaler ?Rosuvastatin 10 mg 1 at bedtime ?Clonidine 0.1 mg 1 at breakfast and 1 at Bedtime ?Bupropion XL 150 mg 1 at breakfast ?Trazodone 100 mg 1 at bedtime prn (Vials) ?Lisinopril-HCTZ 20-12.5 mg 2 at breakfast ?Omeprazole 40 mg 1 at breakfast  ? ?Patient declined (meds) last month ?Unable to reach pt last month ? ?Patient is due for next adherence delivery on: 08/30/21. ?Called patient and reviewed medications and coordinated delivery. ? ?This delivery to include: ?Aripiprazole '2mg'$  1 at breakfast ?Isosorbide Monoitrate 30 mg 0.5 tablet at Breakfast  ?Budesonide/glycopyrrl/formot 160-9-48 inhaler ?Rosuvastatin 10 mg 1 at bedtime ?Clonidine  0.1 mg 1 at breakfast and 1 at Bedtime ?Bupropion XL 150 mg 1 at breakfast ?Trazodone 100 mg 1 at bedtime prn (Vials) ?Lisinopril-HCTZ 20-12.5 mg 2 at breakfast ?Omeprazole 40 mg 1 at breakfast  ?Alubterol Inhaler 122mg 2 puffs every 6hrs prn  ? ?Patient declined the following medications  ?Aspirin -Gets OTC ?Albuterol Solution- Has plenty only uses prn  ?Promethazine '25mg'$ - Not using right now, only taking omeprazole   ?Zofran '4mg'$ - Not using right now, uses omeprazole  ?Valium '5mg'$ - Not currently taking  ? ?Patient needs refills - Request sent  ?Albuterol Inhaler 1043m ? ?Confirmed delivery date of 08/30/21, advised patient that pharmacy will contact them the morning of delivery. ? ? ?DaElray McgregorCMA ?Clinical Pharmacist Assistant  ?[phone]?

## 2021-08-19 NOTE — Telephone Encounter (Signed)
Compliant on meds 

## 2021-08-24 ENCOUNTER — Other Ambulatory Visit: Payer: Self-pay | Admitting: Legal Medicine

## 2021-08-24 DIAGNOSIS — J432 Centrilobular emphysema: Secondary | ICD-10-CM

## 2021-08-26 DIAGNOSIS — I119 Hypertensive heart disease without heart failure: Secondary | ICD-10-CM | POA: Diagnosis not present

## 2021-08-26 DIAGNOSIS — F32A Depression, unspecified: Secondary | ICD-10-CM

## 2021-08-26 DIAGNOSIS — J449 Chronic obstructive pulmonary disease, unspecified: Secondary | ICD-10-CM

## 2021-09-07 ENCOUNTER — Ambulatory Visit: Payer: Medicare Other | Admitting: Legal Medicine

## 2021-09-19 ENCOUNTER — Telehealth: Payer: Self-pay

## 2021-09-19 ENCOUNTER — Other Ambulatory Visit: Payer: Self-pay

## 2021-09-19 DIAGNOSIS — F322 Major depressive disorder, single episode, severe without psychotic features: Secondary | ICD-10-CM

## 2021-09-19 MED ORDER — LISINOPRIL-HYDROCHLOROTHIAZIDE 20-12.5 MG PO TABS: 2.0000 | ORAL_TABLET | Freq: Every morning | ORAL | 1 refills | Status: DC

## 2021-09-19 MED ORDER — TRAZODONE HCL 100 MG PO TABS: 100.0000 mg | ORAL_TABLET | Freq: Every day | ORAL | 1 refills | Status: DC

## 2021-09-19 MED ORDER — OMEPRAZOLE 40 MG PO CPDR
40.0000 mg | DELAYED_RELEASE_CAPSULE | Freq: Every day | ORAL | 1 refills | Status: DC
Start: 2021-09-19 — End: 2022-03-14

## 2021-09-19 MED ORDER — BUDESON-GLYCOPYRROL-FORMOTEROL 160-9-4.8 MCG/ACT IN AERO: 2.0000 | INHALATION_SPRAY | Freq: Two times a day (BID) | RESPIRATORY_TRACT | 2 refills | Status: DC | PRN

## 2021-09-19 NOTE — Progress Notes (Signed)
? ? ?Chronic Care Management ?Pharmacy Assistant  ? ?Name: [name] [name]  MRN: [id number] DOB: 01-29-1946 ? ? ?Reason for Encounter: Medication Coordination for [facility]  ?  ?Recent office visits:  ?None ? ?Recent consult visits:  ?None ? ?Hospital visits:  ?None ? ?Medications: ?Outpatient Encounter Medications as of 09/19/2021  ?Medication Sig  ? albuterol (PROVENTIL) (2.5 MG/3ML) 0.083% nebulizer solution Take 3 mLs (2.5 mg total) by nebulization every 6 (six) hours as needed for wheezing or shortness of breath.  ? albuterol (VENTOLIN HFA) 108 (90 Base) MCG/ACT inhaler INHALE TWO PUFFS BY MOUTH INTO LUNGS EVERY 6 HOURS AS NEEDED FOR SHORTNESS OF BREATH OR wheezing  ? ARIPiprazole (ABILIFY) 2 MG tablet Take 1 tablet (2 mg total) by mouth daily. Still take wellbutrin  ? aspirin EC 81 MG tablet Take 1 tablet (81 mg total) by mouth daily. Swallow whole.  ? Budeson-Glycopyrrol-Formoterol 160-9-4.8 MCG/ACT AERO Inhale 2 puffs into the lungs 2 (two) times daily as needed (shortness of breath).  ? buPROPion (WELLBUTRIN XL) 150 MG 24 hr tablet TAKE 1 TABLET(150 MG) BY MOUTH DAILY  ? cloNIDine (CATAPRES) 0.1 MG tablet Take 1 tablet (0.1 mg total) by mouth 2 (two) times daily.  ? diazepam (VALIUM) 5 MG tablet TAKE 1 TABLET BY MOUTH EVERY 12 HOURS AS NEEDED FOR ANXIETY. WATCH FOR SEDATION  ? isosorbide mononitrate (IMDUR) 30 MG 24 hr tablet Take 0.5 tablets (15 mg total) by mouth daily.  ? lisinopril-hydrochlorothiazide (ZESTORETIC) 20-12.5 MG tablet Take 2 tablets by mouth every morning.  ? nitroGLYCERIN (NITROSTAT) 0.4 MG SL tablet Place 1 tablet (0.4 mg total) under the tongue every 5 (five) minutes as needed for chest pain.  ? omeprazole (PRILOSEC) 40 MG capsule Take 1 capsule (40 mg total) by mouth daily.  ? ondansetron (ZOFRAN) 4 MG tablet Take 1 tablet (4 mg total) by mouth every 6 (six) hours as needed for nausea or vomiting.  ? promethazine (PHENERGAN) 25 MG tablet Take 1 tablet (25 mg total) by mouth every 6 (six)  hours as needed for nausea or vomiting.  ? rosuvastatin (CRESTOR) 10 MG tablet Take 1 tablet (10 mg total) by mouth daily.  ? traZODone (DESYREL) 100 MG tablet TAKE 1 TABLET BY MOUTH EVERY DAY AT BEDTIME  ? ?No facility-administered encounter medications on file as of 09/19/2021.  ? ? ?Reviewed chart for medication changes ahead of medication coordination call. ? ?No OVs, Consults, or hospital visits since last care coordination call/Pharmacist visit.  ? ?No medication changes indicated OR if recent visit, treatment plan here. ? ?BP Readings from Last 3 Encounters:  ?06/23/21 138/70  ?06/08/21 136/86  ?05/05/21 122/80  ?  ?Lab Results  ?Component Value Date  ? HGBA1C 5.3 05/05/2021  ?  ? ?Patient obtains medications through Adherence Packaging  30 Days  ? ?Last adherence delivery included:  ?Aripiprazole '2mg'$  1 at breakfast ?Isosorbide Monoitrate 30 mg 0.5 tablet at Breakfast  ?Budesonide/glycopyrrl/formot 160-9-48 inhaler ?Rosuvastatin 10 mg 1 at bedtime ?Clonidine 0.1 mg 1 at breakfast and 1 at Bedtime ?Bupropion XL 150 mg 1 at breakfast ?Trazodone 100 mg 1 at bedtime prn (Vials) ?Lisinopril-HCTZ 20-12.5 mg 2 at breakfast ?Omeprazole 40 mg 1 at breakfast  ?Alubterol Inhaler 162mg 2 puffs every 6hrs prn  ? ?Patient declined (meds) last month  ?Aspirin -Gets OTC ?Albuterol Solution- Has plenty only uses prn  ?Promethazine '25mg'$ - Not using right now, only taking omeprazole  ?Zofran '4mg'$ - Not using right now, uses omeprazole  ?Valium '5mg'$ - Not currently taking  ? ?  Patient is due for next adherence delivery on: 09/29/21. ?Called patient and reviewed medications and coordinated delivery. ? ?This delivery to include: ?Aripiprazole '2mg'$  1 at breakfast ?Isosorbide Monoitrate 30 mg 0.5 tablet at Breakfast  ?Rosuvastatin 10 mg 1 at bedtime ?Clonidine 0.1 mg 1 at breakfast and 1 at Bedtime ?Bupropion XL 150 mg 1 at breakfast ?Trazodone 100 mg 1 at bedtime prn (Vials) ?Lisinopril-HCTZ 20-12.5 mg 2 at breakfast ?Omeprazole 40 mg 1 at  breakfast  ?Alubterol Inhaler 29mg 2 puffs every 6hrs prn  ? ?Patient declined the following medications  ?Aspirin -Gets OTC ?Albuterol Solution- Has plenty only uses prn  ?Promethazine '25mg'$ - Not using right now, only taking omeprazole  ?Valium '5mg'$ - Not currently taking  ?Budesonide/glycopyrrl/formot 160-9-48 inhaler- Pt still has plenty of supply and can wait until next delivery ? ?Patient needs refills -Request Sent  ?Trazodone 100 mg ?Lisinopril-HCTZ 20-12.5 mg ?Omeprazole 40 mg  ?Budesonide/glycopyrrl/formot 160-9-48 inhaler ? ?Confirmed delivery date of 09/29/21, advised patient that pharmacy will contact them the morning of delivery. ? ?DElray Mcgregor CMA ?Clinical Pharmacist Assistant  ?[phone] ?

## 2021-09-20 ENCOUNTER — Ambulatory Visit: Payer: Medicare Other | Admitting: Legal Medicine

## 2021-09-20 NOTE — Telephone Encounter (Signed)
Compliant on meds 

## 2021-09-20 NOTE — Progress Notes (Deleted)
? ?Subjective:  ?Patient ID: [name] [name], female    DOB: 31-Dec-1945  Age: 75 y.o. MRN: [id number] ? ?No chief complaint on file. ? ? ?HPI ?Hypertension: Patient is taking lisinopril/hctz 20-12.5 2 tablets every morning, Aspirin 81 mg daily, Clonidine 0.1 mg twice a day.  ? ?Hyperlipidemia: She takes Rosuvastatin 10 mg daily. ? ?COPD: Using Albuterol inhaler and nebulizer solution PRN, Budeson-Glycopyrrol-Formoterol 160-9-4.8 mcg 2 puff twice a day. ? ?Current Outpatient Medications on File Prior to Visit  ?Medication Sig Dispense Refill  ? albuterol (PROVENTIL) (2.5 MG/3ML) 0.083% nebulizer solution Take 3 mLs (2.5 mg total) by nebulization every 6 (six) hours as needed for wheezing or shortness of breath. 120 mL 6  ? albuterol (VENTOLIN HFA) 108 (90 Base) MCG/ACT inhaler INHALE TWO PUFFS BY MOUTH INTO LUNGS EVERY 6 HOURS AS NEEDED FOR SHORTNESS OF BREATH OR wheezing 8.5 g 6  ? ARIPiprazole (ABILIFY) 2 MG tablet Take 1 tablet (2 mg total) by mouth daily. Still take wellbutrin 30 tablet 3  ? aspirin EC 81 MG tablet Take 1 tablet (81 mg total) by mouth daily. Swallow whole. 90 tablet 3  ? Budeson-Glycopyrrol-Formoterol 160-9-4.8 MCG/ACT AERO Inhale 2 puffs into the lungs 2 (two) times daily as needed (shortness of breath). 10.7 g 2  ? buPROPion (WELLBUTRIN XL) 150 MG 24 hr tablet TAKE 1 TABLET(150 MG) BY MOUTH DAILY 90 tablet 2  ? cloNIDine (CATAPRES) 0.1 MG tablet Take 1 tablet (0.1 mg total) by mouth 2 (two) times daily. 180 tablet 3  ? diazepam (VALIUM) 5 MG tablet TAKE 1 TABLET BY MOUTH EVERY 12 HOURS AS NEEDED FOR ANXIETY. WATCH FOR SEDATION 60 tablet 3  ? isosorbide mononitrate (IMDUR) 30 MG 24 hr tablet Take 0.5 tablets (15 mg total) by mouth daily. 45 tablet 3  ? lisinopril-hydrochlorothiazide (ZESTORETIC) 20-12.5 MG tablet Take 2 tablets by mouth every morning. 180 tablet 1  ? nitroGLYCERIN (NITROSTAT) 0.4 MG SL tablet Place 1 tablet (0.4 mg total) under the tongue every 5 (five) minutes as needed for chest  pain. 25 tablet 3  ? omeprazole (PRILOSEC) 40 MG capsule Take 1 capsule (40 mg total) by mouth daily. 90 capsule 1  ? ondansetron (ZOFRAN) 4 MG tablet Take 1 tablet (4 mg total) by mouth every 6 (six) hours as needed for nausea or vomiting. 20 tablet 0  ? promethazine (PHENERGAN) 25 MG tablet Take 1 tablet (25 mg total) by mouth every 6 (six) hours as needed for nausea or vomiting. 30 tablet 3  ? rosuvastatin (CRESTOR) 10 MG tablet Take 1 tablet (10 mg total) by mouth daily. 90 tablet 3  ? traZODone (DESYREL) 100 MG tablet Take 1 tablet (100 mg total) by mouth at bedtime. 90 tablet 1  ? ?No current facility-administered medications on file prior to visit.  ? ?Past Medical History:  ?Diagnosis Date  ? Anxiety   ? Aortic atherosclerosis ([place])   ? Ascending aorta dilation ([facility])   ? a.) TTE 09/02/19 --> mild; measured 21m.  ? Asthma   ? CAD (coronary artery disease)   ? a,) CTA 08/24/20 --> mild CAD (25-49%) in RCA, LAD, LCx; CAC/Agatston score 509.  ? Cataract   ? Chronic left shoulder pain 09/27/2015  ? COPD (chronic obstructive pulmonary disease) ([place]   ? Depression   ? Diastolic dysfunction   ? a.) TTE 09/02/19 --> LVEF 60-65%; G2DD. b.) TTE 07/23/20 --> LVEF 70-75%; G1DD.  ? GERD (gastroesophageal reflux disease)   ? History of kidney stones   ?  Hyperlipidemia   ? Hypertension   ? Lupus ([place])   ? Myocardial infarction [facility])   ? Osteoarthritis   ? PAC (premature atrial contraction)   ? a.) Holter 12/04/19 --> occassional; 1.1% PAC burden.  ? Pain of right hip joint 11/22/2015  ? Paraesophageal hernia   ? Presbyesophagus   ? Primary osteoarthritis of left knee 02/04/2015  ? PSVT (paroxysmal supraventricular tachycardia) ([place])   ? a.) Holter 12/04/19 --> 22 runs.  ? Schatzki's ring   ? SOB (shortness of breath)   ? T2DM (type 2 diabetes mellitus) ([place])   ? ?Past Surgical History:  ?Procedure Laterality Date  ? ABDOMINAL HYSTERECTOMY    ? BLADDER SUSPENSION    ? BREAST BIOPSY Right   ? COLONOSCOPY  09/04/2011  ?  Colonic polyps, status post polyectomy. Incidental small ascending colon lipoma  ? ESOPHAGOGASTRODUODENOSCOPY  09/08/2015  ? Schatzki ring status post esophageal dilitation. Small hiatal hernia  ? hemorrhoid surgery    ? KNEE SURGERY    ? left  ? TOTAL KNEE ARTHROPLASTY Left 02/15/2015  ? Procedure: TOTAL KNEE ARTHROPLASTY;  Surgeon: Vickey Huger, MD;  Location: [place];  Service: Orthopedics;  Laterality: Left;  ? TUBAL LIGATION    ? WRIST SURGERY    ? right  ? XI ROBOTIC ASSISTED PARAESOPHAGEAL HERNIA REPAIR N/A 02/22/2021  ? Procedure: XI ROBOTIC ASSISTED PARAESOPHAGEAL HERNIA REPAIR with RNFA to assist;  Surgeon: Jules Husbands, MD;  Location: [facility];  Service: General;  Laterality: N/A;  ?  ?Family History  ?Problem Relation Age of Onset  ? Stroke Mother   ? Heart disease Mother   ? COPD Father   ? Cancer Father   ?     Bone  ? Diabetes Father   ? Heart disease Father   ? Stroke Father   ? Lupus Sister   ? Seizures Son   ? COPD Son   ? Colon cancer Neg Hx   ? Esophageal cancer Neg Hx   ? Stomach cancer Neg Hx   ? Rectal cancer Neg Hx   ? ?Social History  ? ?Socioeconomic History  ? Marital status: Widowed  ?  Spouse name: Not on file  ? Number of children: 4  ? Years of education: Not on file  ? Highest education level: Not on file  ?Occupational History  ? Occupation: Retired  ?Tobacco Use  ? Smoking status: Every Day  ?  Packs/day: 1.00  ?  Years: 47.00  ?  Pack years: 47.00  ?  Types: Cigarettes  ? Smokeless tobacco: Never  ? Tobacco comments:  ?  Smokes 1 pack daily 01/04/21 [facility]   ?Vaping Use  ? Vaping Use: Never used  ?Substance and Sexual Activity  ? Alcohol use: No  ? Drug use: No  ? Sexual activity: Not Currently  ?Other Topics Concern  ? Not on file  ?Social History Narrative  ? Not on file  ? ?Social Determinants of Health  ? ?Financial Resource Strain: Low Risk   ? Difficulty of Paying Living Expenses: Not hard at all  ?Food Insecurity: Not on file  ?Transportation Needs: Unmet Transportation Needs  ?  Lack of Transportation (Medical): Yes  ? Lack of Transportation (Non-Medical): Yes  ?Physical Activity: Insufficiently Active  ? Days of Exercise per Week: 2 days  ? Minutes of Exercise per Session: 20 min  ?Stress: Stress Concern Present  ? Feeling of Stress : To some extent  ?Social Connections: Socially Isolated  ? Frequency  of Communication with Friends and Family: Three times a week  ? Frequency of Social Gatherings with Friends and Family: Never  ? Attends Religious Services: Never  ? Active Member of Clubs or Organizations: No  ? Attends Archivist Meetings: Never  ? Marital Status: Widowed  ? ? ?Review of Systems ? ? ?Objective:  ?There were no vitals taken for this visit. ? ? ?  06/23/2021  ?  3:59 PM 06/08/2021  ?  2:57 PM 05/05/2021  ?  2:21 PM  ?BP/Weight  ?Systolic BP 542 706 237  ?Diastolic BP 70 86 80  ?Wt. (Lbs) 144.25 145.8 154  ?BMI 26.38 kg/m2 26.67 kg/m2 27.28 kg/m2  ? ? ?Physical Exam ? ?Diabetic Foot Exam - Simple   ?No data filed ?  ?  ? ?Lab Results  ?Component Value Date  ? WBC 6.2 05/05/2021  ? HGB 14.3 05/05/2021  ? HCT 41.1 05/05/2021  ? PLT 192 05/05/2021  ? GLUCOSE 86 05/05/2021  ? CHOL 143 05/05/2021  ? TRIG 62 05/05/2021  ? HDL 64 05/05/2021  ? [place] 66 05/05/2021  ? ALT 13 05/05/2021  ? AST 18 05/05/2021  ? NA 141 05/05/2021  ? K 4.0 05/05/2021  ? CL 100 05/05/2021  ? CREATININE 0.69 05/05/2021  ? BUN 8 05/05/2021  ? CO2 27 05/05/2021  ? INR 1.02 01/28/2015  ? HGBA1C 5.3 05/05/2021  ? ? ? ? ?Assessment & Plan:  ? ?Problem List Items Addressed This Visit   ? ?  ? Cardiovascular and Mediastinum  ? Essential hypertension - Primary  ? PAT (paroxysmal atrial tachycardia) ([place])  ? Coronary artery disease involving native coronary artery of native heart without angina pectoris  ?  ? Respiratory  ? COPD (chronic obstructive pulmonary disease) ([place])  ?  ? Digestive  ? GERD with esophagitis  ?  ? Other  ? Hyperlipidemia  ?. ? ?No orders of the defined types were placed in this  encounter. ? ? ?No orders of the defined types were placed in this encounter. ?  ? ?Follow-up: No follow-ups on file. ? ?An After Visit Summary was printed and given to the patient. ? ?[name], MD ?[facility]

## 2021-10-12 ENCOUNTER — Encounter: Payer: Self-pay | Admitting: Legal Medicine

## 2021-10-12 ENCOUNTER — Ambulatory Visit (INDEPENDENT_AMBULATORY_CARE_PROVIDER_SITE_OTHER): Payer: Medicare Other | Admitting: Legal Medicine

## 2021-10-12 VITALS — BP 124/60 | HR 67 | Temp 98.5°F | Resp 15 | Ht 62.0 in | Wt 139.0 lb

## 2021-10-12 DIAGNOSIS — I471 Supraventricular tachycardia: Secondary | ICD-10-CM

## 2021-10-12 DIAGNOSIS — Z79891 Long term (current) use of opiate analgesic: Secondary | ICD-10-CM | POA: Diagnosis not present

## 2021-10-12 DIAGNOSIS — K2100 Gastro-esophageal reflux disease with esophagitis, without bleeding: Secondary | ICD-10-CM | POA: Diagnosis not present

## 2021-10-12 DIAGNOSIS — R7989 Other specified abnormal findings of blood chemistry: Secondary | ICD-10-CM

## 2021-10-12 DIAGNOSIS — M159 Polyosteoarthritis, unspecified: Secondary | ICD-10-CM | POA: Diagnosis not present

## 2021-10-12 DIAGNOSIS — I700 Atherosclerosis of aorta: Secondary | ICD-10-CM

## 2021-10-12 DIAGNOSIS — I10 Essential (primary) hypertension: Secondary | ICD-10-CM | POA: Diagnosis not present

## 2021-10-12 DIAGNOSIS — I4719 Other supraventricular tachycardia: Secondary | ICD-10-CM

## 2021-10-12 DIAGNOSIS — I2510 Atherosclerotic heart disease of native coronary artery without angina pectoris: Secondary | ICD-10-CM | POA: Diagnosis not present

## 2021-10-12 DIAGNOSIS — E782 Mixed hyperlipidemia: Secondary | ICD-10-CM

## 2021-10-12 DIAGNOSIS — E1169 Type 2 diabetes mellitus with other specified complication: Secondary | ICD-10-CM

## 2021-10-12 DIAGNOSIS — E538 Deficiency of other specified B group vitamins: Secondary | ICD-10-CM | POA: Diagnosis not present

## 2021-10-12 DIAGNOSIS — J449 Chronic obstructive pulmonary disease, unspecified: Secondary | ICD-10-CM | POA: Diagnosis not present

## 2021-10-12 DIAGNOSIS — R270 Ataxia, unspecified: Secondary | ICD-10-CM

## 2021-10-12 DIAGNOSIS — M150 Primary generalized (osteo)arthritis: Secondary | ICD-10-CM

## 2021-10-12 DIAGNOSIS — F322 Major depressive disorder, single episode, severe without psychotic features: Secondary | ICD-10-CM

## 2021-10-12 MED ORDER — NITROGLYCERIN 0.4 MG SL SUBL: 0.4000 mg | SUBLINGUAL_TABLET | SUBLINGUAL | 3 refills | Status: DC | PRN

## 2021-10-12 MED ORDER — ROSUVASTATIN CALCIUM 10 MG PO TABS: 10.0000 mg | ORAL_TABLET | Freq: Every day | ORAL | 1 refills | Status: DC

## 2021-10-12 NOTE — Progress Notes (Signed)
Subjective:  Patient ID: [name] [name], female    DOB: Oct 21, 1945  Age: 76 y.o. MRN: [id number]  Chief Complaint  Patient presents with   Diabetes   Hypertension   Hyperlipidemia    HPI: chronic visit  [place] and [place]ics [place]. Normal sugar.  Did EKG.  She is not checkin sugars.  Smoking 1.2 ppd. From 2 ppks.She is orthostatic for 3 months.  Unable to walk without cane. No evidence for stroke.  Diabetes:  Patient is not taking any medication. She does not check her blood sugar.last A1c 5.3. doubt dm  Hypertension: She takes aspirin 81 mg daily, clonidine 0.1 mg twice a day, lisinopril-hctz 20-12.5 mg 2 tablets every morning.  Hyperlipidemia: Currently taking rosuvastatin 10 mg daily. Patient presents with hyperlipidemia.  Compliance with treatment has been good; patient takes medicines as directed, maintains low cholesterol diet, follows up as directed, and maintains exercise regimen.  Patient is using crestor without problems.   Anxiety: Takes diazepam 5 mg daily.  Depression: Taking Abilify 2 mg daily, wellbutrin 150 mg daily, Isosorbide 15 mg daily.  Trouble walking has used rollator in past. Current Outpatient Medications on File Prior to Visit  Medication Sig Dispense Refill   albuterol (PROVENTIL) (2.5 MG/3ML) 0.083% nebulizer solution Take 3 mLs (2.5 mg total) by nebulization every 6 (six) hours as needed for wheezing or shortness of breath. 120 mL 6   albuterol (VENTOLIN HFA) 108 (90 Base) MCG/ACT inhaler INHALE TWO PUFFS BY MOUTH INTO LUNGS EVERY 6 HOURS AS NEEDED FOR SHORTNESS OF BREATH OR wheezing 8.5 g 6   ARIPiprazole (ABILIFY) 2 MG tablet Take 1 tablet (2 mg total) by mouth daily. Still take wellbutrin 30 tablet 3   aspirin EC 81 MG tablet Take 1 tablet (81 mg total) by mouth daily. Swallow whole. 90 tablet 3   Budeson-Glycopyrrol-Formoterol 160-9-4.8 MCG/ACT AERO Inhale 2 puffs into the lungs 2 (two) times daily as needed (shortness  of breath). 10.7 g 2   buPROPion (WELLBUTRIN XL) 150 MG 24 hr tablet TAKE 1 TABLET(150 MG) BY MOUTH DAILY 90 tablet 2   cloNIDine (CATAPRES) 0.1 MG tablet Take 1 tablet (0.1 mg total) by mouth 2 (two) times daily. 180 tablet 3   diazepam (VALIUM) 5 MG tablet TAKE 1 TABLET BY MOUTH EVERY 12 HOURS AS NEEDED FOR ANXIETY. WATCH FOR SEDATION 60 tablet 3   isosorbide mononitrate (IMDUR) 30 MG 24 hr tablet Take 0.5 tablets (15 mg total) by mouth daily. 45 tablet 3   lisinopril-hydrochlorothiazide (ZESTORETIC) 20-12.5 MG tablet Take 2 tablets by mouth every morning. 180 tablet 1   omeprazole (PRILOSEC) 40 MG capsule Take 1 capsule (40 mg total) by mouth daily. 90 capsule 1   traZODone (DESYREL) 100 MG tablet Take 1 tablet (100 mg total) by mouth at bedtime. 90 tablet 1   No current facility-administered medications on file prior to visit.   Past Medical History:  Diagnosis Date   Anxiety    Aortic atherosclerosis ([place])    Ascending aorta dilation ([place])    a.) TTE 09/02/19 --> mild; measured 35m.   Asthma    CAD (coronary artery disease)    a,) CTA 08/24/20 --> mild CAD (25-49%) in RCA, LAD, LCx; CAC/Agatston score 509.   Cataract    Chronic left shoulder pain 09/27/2015   COPD (chronic obstructive pulmonary disease) (HCC)    Depression    Diastolic dysfunction    a.) TTE 09/02/19 --> LVEF 60-65%; G2DD. b.)  TTE 07/23/20 --> LVEF 70-75%; G1DD.   GERD (gastroesophageal reflux disease)    History of kidney stones    Hyperlipidemia    Hypertension    Lupus (HCC)    Myocardial infarction ([place])    Osteoarthritis    PAC (premature atrial contraction)    a.) Holter 12/04/19 --> occassional; 1.1% PAC burden.   Pain of right hip joint 11/22/2015   Paraesophageal hernia    Presbyesophagus    Primary osteoarthritis of left knee 02/04/2015   PSVT (paroxysmal supraventricular tachycardia) ([place])    a.) Holter 12/04/19 --> 22 runs.   Schatzki's ring    SOB (shortness of breath)    T2DM (type 2  diabetes mellitus) ([place])    Past Surgical History:  Procedure Laterality Date   ABDOMINAL HYSTERECTOMY     BLADDER SUSPENSION     BREAST BIOPSY Right    COLONOSCOPY  09/04/2011   Colonic polyps, status post polyectomy. Incidental small ascending colon lipoma   ESOPHAGOGASTRODUODENOSCOPY  09/08/2015   Schatzki ring status post esophageal dilitation. Small hiatal hernia   hemorrhoid surgery     KNEE SURGERY     left   TOTAL KNEE ARTHROPLASTY Left 02/15/2015   Procedure: TOTAL KNEE ARTHROPLASTY;  Surgeon: Vickey Huger, MD;  Location: [facility];  Service: Orthopedics;  Laterality: Left;   TUBAL LIGATION     WRIST SURGERY     right   XI ROBOTIC ASSISTED PARAESOPHAGEAL HERNIA REPAIR N/A 02/22/2021   Procedure: XI ROBOTIC ASSISTED PARAESOPHAGEAL HERNIA REPAIR with RNFA to assist;  Surgeon: Jules Husbands, MD;  Location: [facility];  Service: General;  Laterality: N/A;    Family History  Problem Relation Age of Onset   Stroke Mother    Heart disease Mother    COPD Father    Cancer Father        Bone   Diabetes Father    Heart disease Father    Stroke Father    Lupus Sister    Seizures Son    COPD Son    Colon cancer Neg Hx    Esophageal cancer Neg Hx    Stomach cancer Neg Hx    Rectal cancer Neg Hx    Social History   Socioeconomic History   Marital status: Widowed    Spouse name: Not on file   Number of children: 4   Years of education: Not on file   Highest education level: Not on file  Occupational History   Occupation: Retired  Tobacco Use   Smoking status: Every Day    Packs/day: 1.00    Years: 47.00    Pack years: 47.00    Types: Cigarettes   Smokeless tobacco: Never   Tobacco comments:    Smokes 1 pack daily 01/04/21 [facility]   Vaping Use   Vaping Use: Never used  Substance and Sexual Activity   Alcohol use: No   Drug use: No   Sexual activity: Not Currently  Other Topics Concern   Not on file  Social History Narrative   Not on file   Social Determinants of Health    Financial Resource Strain: Not on file  Food Insecurity: Not on file  Transportation Needs: Unmet Transportation Needs   Lack of Transportation (Medical): Yes   Lack of Transportation (Non-Medical): Yes  Physical Activity: Not on file  Stress: Not on file  Social Connections: Not on file    Review of Systems  Constitutional:  Positive for chills. Negative for fatigue and fever.  HENT:  Negative for congestion, ear pain and sore throat.   Respiratory:  Positive for cough and shortness of breath.   Cardiovascular:  Positive for chest pain. Negative for palpitations.  Gastrointestinal:  Negative for abdominal pain, constipation, diarrhea, nausea and vomiting.  Endocrine: Negative for polydipsia, polyphagia and polyuria.  Genitourinary:  Negative for difficulty urinating and dysuria.  Musculoskeletal:  Positive for arthralgias and back pain. Negative for myalgias.  Skin:  Negative for rash.  Neurological:  Negative for headaches.  Psychiatric/Behavioral:  Negative for dysphoric mood. The patient is not nervous/anxious.     Objective:  BP 124/60   Pulse 67   Temp 98.5 F (36.9 C)   Resp 15   Ht '5\' 2"'$  (1.575 m)   Wt 139 lb (63 kg)   SpO2 97%   BMI 25.42 kg/m      10/12/2021    3:07 PM 06/23/2021    3:59 PM 06/08/2021    2:57 PM  BP/Weight  Systolic BP 425 956 387  Diastolic BP 60 70 86  Wt. (Lbs) 139 144.25 145.8  BMI 25.42 kg/m2 26.38 kg/m2 26.67 kg/m2    Physical Exam Vitals reviewed.  Constitutional:      Appearance: She is ill-appearing.  HENT:     Head: Normocephalic.     Right Ear: Tympanic membrane normal.     Left Ear: Tympanic membrane normal.     Mouth/Throat:     Mouth: Mucous membranes are moist.  Eyes:     Conjunctiva/sclera: Conjunctivae normal.     Pupils: Pupils are equal, round, and reactive to light.     Comments: No nystagmus  Cardiovascular:     Rate and Rhythm: Normal rate and regular rhythm.     Pulses: Normal pulses.     Heart sounds:  Normal heart sounds.    No gallop.  Pulmonary:     Effort: Pulmonary effort is normal. No respiratory distress.     Breath sounds: Normal breath sounds. No wheezing.  Abdominal:     General: Abdomen is flat. Bowel sounds are normal. There is no distension.     Palpations: Abdomen is soft.     Tenderness: There is no abdominal tenderness.  Musculoskeletal:     Cervical back: Normal range of motion.     Right lower leg: No edema.     Left lower leg: No edema.  Skin:    General: Skin is warm.     Capillary Refill: Capillary refill takes less than 2 seconds.  Neurological:     Mental Status: She is alert.     Motor: No weakness.     Gait: Gait normal.     Deep Tendon Reflexes: Reflexes normal.     Comments: Very unstable rhomberg, unable to do heel to toe walking        Lab Results  Component Value Date   WBC 6.2 10/12/2021   HGB 13.1 10/12/2021   HCT 38.4 10/12/2021   PLT 205 10/12/2021   GLUCOSE 96 10/12/2021   CHOL 187 10/12/2021   TRIG 59 10/12/2021   HDL 73 10/12/2021   LDLCALC 103 (H) 10/12/2021   ALT 11 10/12/2021   AST 14 10/12/2021   NA 133 (L) 10/12/2021   K 4.0 10/12/2021   CL 95 (L) 10/12/2021   CREATININE 0.75 10/12/2021   BUN 12 10/12/2021   CO2 27 10/12/2021   INR 1.02 01/28/2015   HGBA1C 5.3 10/12/2021      08/11/2021   11:14 AM  05/05/2021    2:40 PM 01/18/2021    2:01 PM 10/12/2020    2:01 PM 09/03/2020   10:00 AM  Depression screen PHQ 2/9  Decreased Interest '1 1 2 2 1  '$ Down, Depressed, Hopeless '3 2 3 2 2  '$ PHQ - 2 Score '4 3 5 4 3  '$ Altered sleeping '3 1 3 3 2  '$ Tired, decreased energy '3 3 3 3 2  '$ Change in appetite '3 2 1 '$ 0 2  Feeling bad or failure about yourself  3 1 0 0 2  Trouble concentrating '3 3 3 3 1  '$ Moving slowly or fidgety/restless '3 3 2 2 1  '$ Suicidal thoughts 0 0 0 0 0  PHQ-9 Score '22 16 17 15 13  '$ Difficult doing work/chores Very difficult Somewhat difficult Somewhat difficult Not difficult at all Very difficult      Assessment &  Plan:   Problem List Items Addressed This Visit       Cardiovascular and Mediastinum Ataxia      Relevant Orders  CT HEAD WO CONTRAST (5MM)  Ambulatory referral to ENT  Ambulatory referral to Physical Therapy Patient is having severe ataxia for 2 months, it is worsening, stroke verses neurodegenerative, no evidence for vestibular problem. She denies illicit drug use or alcohol.       Essential hypertension - Primary   Relevant Medications   rosuvastatin (CRESTOR) 10 MG tablet   nitroGLYCERIN (NITROSTAT) 0.4 MG SL tablet An individual hypertension care plan was established and reinforced today.  The patient's status was assessed using clinical findings on exam and labs or diagnostic tests. The patient's success at meeting treatment goals on disease specific evidence-based guidelines and found to be well controlled. SELF MANAGEMENT: The patient and I together assessed ways to personally work towards obtaining the recommended goals. No orthostasis on BP checkes RECOMMENDATIONS: avoid decongestants found in common cold remedies, decrease consumption of alcohol, perform routine monitoring of BP with home BP cuff, exercise, reduction of dietary salt, take medicines as prescribed, try not to miss doses and quit smoking.  Regular exercise and maintaining a healthy weight is needed.  Stress reduction may help. A CLINICAL SUMMARY including written plan identify barriers to care unique to individual due to social or financial issues.  We attempt to mutually creat solutions for individual and family understanding.    Other Relevant Orders   Comprehensive metabolic panel (Completed)   CBC with Differential/Platelet (Completed)   PAT (paroxysmal atrial tachycardia) (HCC)   Relevant Medications   rosuvastatin (CRESTOR) 10 MG tablet   nitroGLYCERIN (NITROSTAT) 0.4 MG SL tablet Patient has a diagnosis of paroxysmal atrial fibrillation.   Patient is on no anticoagulant and has controlled ventricular  response.  Patient is CV stable.    Coronary artery disease involving native coronary artery of native heart without angina pectoris   Relevant Medications   rosuvastatin (CRESTOR) 10 MG tablet   nitroGLYCERIN (NITROSTAT) 0.4 MG SL tablet Patient denies chest pain     Respiratory   COPD (chronic obstructive pulmonary disease) ([place]) An individualize plan was formulated for care of COPD.  Treatment is evidence based.  She will continue on inhalers, avoid smoking and smoke.  Regular exercise with help with dyspnea. Routine follow ups and medication compliance is needed.      Digestive   GERD with esophagitis Plan of care was formulated today.  She is doing well.  A plan of care was formulated using patient exam, tests and other sources to optimize care  using evidence based information.  Recommend no smoking, no eating after supper, avoid fatty foods, elevate Head of bed, avoid tight fitting clothing.  Continue on omeprazole.      Endocrine   Type 2 diabetes mellitus with other specified complication (HCC)   Relevant Medications   rosuvastatin (CRESTOR) 10 MG tablet   Other Relevant Orders   Hemoglobin A1c (Completed)   Microalbumin / creatinine urine ratio (Completed) An individual care plan for diabetes was established and reinforced today.  The patient's status was assessed using clinical findings on exam, labs and diagnostic testing. Patient success at meeting goals based on disease specific evidence-based guidelines and found to be fair controlled. Medications were assessed and patient's understanding of the medical issues , including barriers were assessed. Recommend adherence to a diabetic diet, a graduated exercise program, HgbA1c level is checked quarterly, and urine microalbumin performed yearly .  Annual mono-filament sensation testing performed. Lower blood pressure and control hyperlipidemia is important. Get annual eye exams and annual flu shots and smoking cessation discussed.   Self management goals were discussed.      Musculoskeletal and Integument   Degenerative joint disease involving multiple joints   Relevant Orders   Ambulatory referral to Pain Clinic Chronic pain was getting medicine from Dr. Nelva Bush, refer to pain clinic     Other   Long-term current use of opiate analgesic AN INDIVIDUAL CARE PLAN was established and reinforced today.  The patient's status was assessed using clinical findings on exam, labs, and other diagnostic testing. Patient's success at meeting treatment goals based on disease specific evidence-bassed guidelines and found to be in fair control. RECOMMENDATIONS include maintining present medicines and treatment. He is on chronichydrocodone with no abuse.  Negative REMS.     Hyperlipidemia   Relevant Medications   rosuvastatin (CRESTOR) 10 MG tablet   nitroGLYCERIN (NITROSTAT) 0.4 MG SL tablet   Other Relevant Orders   Lipid panel (Completed) AN INDIVIDUAL CARE PLAN for hyperlipidemia/ cholesterol was established and reinforced today.  The patient's status was assessed using clinical findings on exam, lab and other diagnostic tests. The patient's disease status was assessed based on evidence-based guidelines and found to be fair controlled. MEDICATIONS were reviewed. SELF MANAGEMENT GOALS have been discussed and patient's success at attaining the goal of low cholesterol was assessed. RECOMMENDATION given include regular exercise 3 days a week and low cholesterol/low fat diet. CLINICAL SUMMARY including written plan to identify barriers unique to the patient due to social or economic  reasons was discussed.     Depression, major, single episode, severe (HCC) Chronic depression stable   Other Visit Diagnoses                    Low vitamin B12 level       Relevant Orders   Vitamin B12 (Completed) Check B12 level for ataxia    Atherosclerosis of aorta (HCC)   (Chronic)     Relevant Medications   rosuvastatin (CRESTOR) 10 MG  tablet   nitroGLYCERIN (NITROSTAT) 0.4 MG SL tablet Patient is o  crestor     .  Meds ordered this encounter  Medications   DISCONTD: nitroGLYCERIN (NITROSTAT) 0.4 MG SL tablet    Sig: Place 1 tablet (0.4 mg total) under the tongue every 5 (five) minutes as needed for chest pain.    Dispense:  25 tablet    Refill:  3   rosuvastatin (CRESTOR) 10 MG tablet    Sig: Take 1 tablet (10 mg  total) by mouth daily.    Dispense:  90 tablet    Refill:  1   nitroGLYCERIN (NITROSTAT) 0.4 MG SL tablet    Sig: Place 1 tablet (0.4 mg total) under the tongue every 5 (five) minutes as needed for chest pain.    Dispense:  25 tablet    Refill:  3    Orders Placed This Encounter  Procedures   CT HEAD WO CONTRAST (5MM)   Comprehensive metabolic panel   Hemoglobin A1c   Lipid panel   CBC with Differential/Platelet   Microalbumin / creatinine urine ratio   Vitamin B12   Cardiovascular Risk Assessment   Ambulatory referral to ENT   Ambulatory referral to Physical Therapy   Ambulatory referral to Pain Clinic   A total of 45 minutes were spent face-to-face with the patient during this encounter and over half of that time was spent on counseling and coordination of care.  We discussed workup of ataxia and multiple referrals made  Follow-up: Return in about 1 month (around 11/12/2021).  An After Visit Summary was printed and given to the patient.  [name], MD [facility] [phone]

## 2021-10-13 LAB — CBC WITH DIFFERENTIAL/PLATELET
Basophils Absolute: 0.1 10*3/uL (ref 0.0–0.2)
Basos: 1 %
EOS (ABSOLUTE): 0.1 10*3/uL (ref 0.0–0.4)
Eos: 1 %
Hematocrit: 38.4 % (ref 34.0–46.6)
Hemoglobin: 13.1 g/dL (ref 11.1–15.9)
Immature Grans (Abs): 0 10*3/uL (ref 0.0–0.1)
Immature Granulocytes: 0 %
Lymphocytes Absolute: 2.2 10*3/uL (ref 0.7–3.1)
Lymphs: 36 %
MCH: 32.1 pg (ref 26.6–33.0)
MCHC: 34.1 g/dL (ref 31.5–35.7)
MCV: 94 fL (ref 79–97)
Monocytes Absolute: 0.6 10*3/uL (ref 0.1–0.9)
Monocytes: 10 %
Neutrophils Absolute: 3.3 10*3/uL (ref 1.4–7.0)
Neutrophils: 52 %
Platelets: 205 10*3/uL (ref 150–450)
RBC: 4.08 x10E6/uL (ref 3.77–5.28)
RDW: 12.6 % (ref 11.7–15.4)
WBC: 6.2 10*3/uL (ref 3.4–10.8)

## 2021-10-13 LAB — COMPREHENSIVE METABOLIC PANEL
ALT: 11 IU/L (ref 0–32)
AST: 14 IU/L (ref 0–40)
Albumin/Globulin Ratio: 2.1 (ref 1.2–2.2)
Albumin: 4.1 g/dL (ref 3.7–4.7)
Alkaline Phosphatase: 61 IU/L (ref 44–121)
BUN/Creatinine Ratio: 16 (ref 12–28)
BUN: 12 mg/dL (ref 8–27)
Bilirubin Total: 0.5 mg/dL (ref 0.0–1.2)
CO2: 27 mmol/L (ref 20–29)
Calcium: 9.8 mg/dL (ref 8.7–10.3)
Chloride: 95 mmol/L — ABNORMAL LOW (ref 96–106)
Creatinine, Ser: 0.75 mg/dL (ref 0.57–1.00)
Globulin, Total: 2 g/dL (ref 1.5–4.5)
Glucose: 96 mg/dL (ref 70–99)
Potassium: 4 mmol/L (ref 3.5–5.2)
Sodium: 133 mmol/L — ABNORMAL LOW (ref 134–144)
Total Protein: 6.1 g/dL (ref 6.0–8.5)
eGFR: 83 mL/min/{1.73_m2} (ref >59)

## 2021-10-13 LAB — MICROALBUMIN / CREATININE URINE RATIO
Creatinine, Urine: 97.6 mg/dL
Microalb/Creat Ratio: 86 mg/g creat — ABNORMAL HIGH (ref 0–29)
Microalbumin, Urine: 83.8 ug/mL

## 2021-10-13 LAB — CARDIOVASCULAR RISK ASSESSMENT

## 2021-10-13 LAB — LIPID PANEL
Chol/HDL Ratio: 2.6 ratio (ref 0.0–4.4)
Cholesterol, Total: 187 mg/dL (ref 100–199)
HDL: 73 mg/dL (ref >39)
LDL Chol Calc (NIH): 103 mg/dL — ABNORMAL HIGH (ref 0–99)
Triglycerides: 59 mg/dL (ref 0–149)
VLDL Cholesterol Cal: 11 mg/dL (ref 5–40)

## 2021-10-13 LAB — VITAMIN B12: Vitamin B-12: 325 pg/mL (ref 232–1245)

## 2021-10-13 LAB — HEMOGLOBIN A1C
Est. average glucose Bld gHb Est-mCnc: 105 mg/dL
Hgb A1c MFr Bld: 5.3 % (ref 4.8–5.6)

## 2021-10-13 NOTE — Progress Notes (Signed)
B12 525 normal,  lp

## 2021-10-13 NOTE — Progress Notes (Signed)
Kidney and liver tests normal, ldl cholesterol 103, microalbuminuria slightly high, A1c 5.3,  lp

## 2021-10-14 ENCOUNTER — Telehealth: Payer: Self-pay | Admitting: Legal Medicine

## 2021-10-14 NOTE — Telephone Encounter (Signed)
   [name] [name] has been scheduled for the following appointment:  WHAT: HEAD CT SCAN WHERE: [facility] DATE: 10/17/21 TIME: 8:00 AM CHECK IN   Patient has been made aware.

## 2021-10-15 ENCOUNTER — Other Ambulatory Visit: Payer: Self-pay

## 2021-10-15 ENCOUNTER — Emergency Department (HOSPITAL_COMMUNITY)
Admission: EM | Admit: 2021-10-15 | Discharge: 2021-10-15 | Disposition: A | Discharge status: Home / Self Care | Payer: Medicare Other | Attending: Emergency Medicine | Admitting: Emergency Medicine

## 2021-10-15 ENCOUNTER — Emergency Department (HOSPITAL_COMMUNITY): Payer: Medicare Other

## 2021-10-15 ENCOUNTER — Encounter (HOSPITAL_COMMUNITY): Payer: Self-pay

## 2021-10-15 DIAGNOSIS — E119 Type 2 diabetes mellitus without complications: Secondary | ICD-10-CM | POA: Diagnosis not present

## 2021-10-15 DIAGNOSIS — R29818 Other symptoms and signs involving the nervous system: Secondary | ICD-10-CM | POA: Diagnosis not present

## 2021-10-15 DIAGNOSIS — I672 Cerebral atherosclerosis: Secondary | ICD-10-CM | POA: Diagnosis not present

## 2021-10-15 DIAGNOSIS — Z79899 Other long term (current) drug therapy: Secondary | ICD-10-CM | POA: Insufficient documentation

## 2021-10-15 DIAGNOSIS — H5500 Unspecified nystagmus: Secondary | ICD-10-CM | POA: Diagnosis not present

## 2021-10-15 DIAGNOSIS — I2510 Atherosclerotic heart disease of native coronary artery without angina pectoris: Secondary | ICD-10-CM | POA: Insufficient documentation

## 2021-10-15 DIAGNOSIS — I6782 Cerebral ischemia: Secondary | ICD-10-CM | POA: Diagnosis not present

## 2021-10-15 DIAGNOSIS — R42 Dizziness and giddiness: Secondary | ICD-10-CM | POA: Insufficient documentation

## 2021-10-15 DIAGNOSIS — M79602 Pain in left arm: Secondary | ICD-10-CM | POA: Insufficient documentation

## 2021-10-15 DIAGNOSIS — I10 Essential (primary) hypertension: Secondary | ICD-10-CM | POA: Diagnosis not present

## 2021-10-15 DIAGNOSIS — D329 Benign neoplasm of meninges, unspecified: Secondary | ICD-10-CM | POA: Diagnosis not present

## 2021-10-15 DIAGNOSIS — R404 Transient alteration of awareness: Secondary | ICD-10-CM | POA: Diagnosis not present

## 2021-10-15 DIAGNOSIS — R6889 Other general symptoms and signs: Secondary | ICD-10-CM | POA: Diagnosis not present

## 2021-10-15 DIAGNOSIS — Z7984 Long term (current) use of oral hypoglycemic drugs: Secondary | ICD-10-CM | POA: Insufficient documentation

## 2021-10-15 DIAGNOSIS — R0789 Other chest pain: Secondary | ICD-10-CM | POA: Diagnosis not present

## 2021-10-15 DIAGNOSIS — R110 Nausea: Secondary | ICD-10-CM | POA: Diagnosis not present

## 2021-10-15 DIAGNOSIS — R072 Precordial pain: Secondary | ICD-10-CM | POA: Diagnosis not present

## 2021-10-15 DIAGNOSIS — R079 Chest pain, unspecified: Secondary | ICD-10-CM | POA: Diagnosis not present

## 2021-10-15 DIAGNOSIS — Z743 Need for continuous supervision: Secondary | ICD-10-CM | POA: Diagnosis not present

## 2021-10-15 DIAGNOSIS — Z7982 Long term (current) use of aspirin: Secondary | ICD-10-CM | POA: Insufficient documentation

## 2021-10-15 DIAGNOSIS — J449 Chronic obstructive pulmonary disease, unspecified: Secondary | ICD-10-CM | POA: Diagnosis not present

## 2021-10-15 DIAGNOSIS — I499 Cardiac arrhythmia, unspecified: Secondary | ICD-10-CM | POA: Diagnosis not present

## 2021-10-15 LAB — COMPREHENSIVE METABOLIC PANEL
ALT: 12 U/L (ref 0–44)
AST: 18 U/L (ref 15–41)
Albumin: 3.5 g/dL (ref 3.5–5.0)
Alkaline Phosphatase: 52 U/L (ref 38–126)
Anion gap: 6 (ref 5–15)
BUN: 10 mg/dL (ref 8–23)
CO2: 29 mmol/L (ref 22–32)
Calcium: 9.2 mg/dL (ref 8.9–10.3)
Chloride: 97 mmol/L — ABNORMAL LOW (ref 98–111)
Creatinine, Ser: 0.73 mg/dL (ref 0.44–1.00)
GFR, Estimated: >60 mL/min (ref >60)
Glucose, Bld: 116 mg/dL — ABNORMAL HIGH (ref 70–99)
Potassium: 3.4 mmol/L — ABNORMAL LOW (ref 3.5–5.1)
Sodium: 132 mmol/L — ABNORMAL LOW (ref 135–145)
Total Bilirubin: 0.5 mg/dL (ref 0.3–1.2)
Total Protein: 6 g/dL — ABNORMAL LOW (ref 6.5–8.1)

## 2021-10-15 LAB — CBC WITH DIFFERENTIAL/PLATELET
Abs Immature Granulocytes: 0.01 10*3/uL (ref 0.00–0.07)
Basophils Absolute: 0.1 10*3/uL (ref 0.0–0.1)
Basophils Relative: 1 %
Eosinophils Absolute: 0 10*3/uL (ref 0.0–0.5)
Eosinophils Relative: 1 %
HCT: 37.7 % (ref 36.0–46.0)
Hemoglobin: 12.8 g/dL (ref 12.0–15.0)
Immature Granulocytes: 0 %
Lymphocytes Relative: 23 %
Lymphs Abs: 1.1 10*3/uL (ref 0.7–4.0)
MCH: 32.2 pg (ref 26.0–34.0)
MCHC: 34 g/dL (ref 30.0–36.0)
MCV: 95 fL (ref 80.0–100.0)
Monocytes Absolute: 0.4 10*3/uL (ref 0.1–1.0)
Monocytes Relative: 9 %
Neutro Abs: 3.1 10*3/uL (ref 1.7–7.7)
Neutrophils Relative %: 66 %
Platelets: 174 10*3/uL (ref 150–400)
RBC: 3.97 MIL/uL (ref 3.87–5.11)
RDW: 13.6 % (ref 11.5–15.5)
WBC: 4.7 10*3/uL (ref 4.0–10.5)
nRBC: 0 % (ref 0.0–0.2)

## 2021-10-15 LAB — TROPONIN I (HIGH SENSITIVITY): Troponin I (High Sensitivity): 5 ng/L (ref <18)

## 2021-10-15 MED ORDER — MECLIZINE HCL 25 MG PO TABS: 25.0000 mg | ORAL_TABLET | Freq: Three times a day (TID) | ORAL | 0 refills | Status: DC | PRN

## 2021-10-15 MED ORDER — LORAZEPAM 2 MG/ML IJ SOLN
0.5000 mg | Freq: Once | INTRAMUSCULAR | Status: AC
  Administered 2021-10-15: 0.5 mg via INTRAVENOUS
  Filled 2021-10-15: qty 1

## 2021-10-15 MED ORDER — MECLIZINE HCL 25 MG PO TABS
25.0000 mg | ORAL_TABLET | Freq: Once | ORAL | Status: AC
  Administered 2021-10-15: 25 mg via ORAL
  Filled 2021-10-15: qty 1

## 2021-10-15 NOTE — Discharge Instructions (Addendum)
You came to the emergency department today to be evaluated for your dizziness.  Your physical exam, lab results, and MRI imaging were reassuring.  The exact cause of your dizziness is unknown at this time however may be due to vertigo.  Due to this I am giving a prescription for meclizine, you may take this medication as prescribed.  Please follow-up closely with your primary care doctor for repeat evaluation.  Get help right away if: You vomit or have diarrhea and are unable to eat or drink anything. You have problems talking, walking, swallowing, or using your arms, hands, or legs. You feel generally weak. You have any bleeding. You are not thinking clearly or you have trouble forming sentences. It may take a friend or family member to notice this. You have chest pain, abdominal pain, shortness of breath, or sweating. Your vision changes or you develop a severe headache.

## 2021-10-15 NOTE — ED Triage Notes (Addendum)
Pt arrives via ems from home. Pt has been experiencing intermittent dizziness for the past 2 weeks. Pt states it became more severe this morning. PCP believes she has vertigo. Pt also endorses intermittent chest pain

## 2021-10-15 NOTE — ED Provider Notes (Signed)
[facility] EMERGENCY DEPARTMENT Provider Note   CSN: [id number] Arrival date & time: 10/15/21  1013     History  Chief Complaint  Patient presents with   Dizziness    [name] [name] is a 76 y.o. female with a history of COPD, ascending aorta dilation, hypertension, history of MI, type 2 diabetes mellitus, CAD, diastolic dysfunction EF 70 to 75% 2/22, paroxysmal SVT, PAC, Schatzki ring.    Presents to the emergency department with a chief complaint of dizziness and chest pain.  Patient reports that she has had intermittent dizziness over the last 2 weeks.  Patient reports that when dizziness is present and feels like the entire room is spinning.  Patient does feel some lightheadedness and decrease in balance when dizziness is present.  Patient reports that dizziness comes on randomly however also is brought on when bending over and closing her eyes.  States that this morning she had the worst episode of dizziness that she is ever had.  Patient states that due to her dizziness she had to crawl on the floor to call for help.  Patient denies any dizziness at present however if she closes her eyes dizziness does come back.  Patient denies any previous episodes of dizziness like this in the past.  No associated numbness, weakness, facial asymmetry, dysarthria, visual disturbance, neck pain.  Denies any recent falls or injuries.  Patient does report that she had an episode of slurred speech on Tuesday, is unsure how long this lasted for.  Has not had any episodes of slurred speech since then.  Patient does report that she has blurred vision to her left eye however states that "has been there for months," and is unchanged with her dizziness.  Patient reports that she had chest pain on Monday night.  Chest pain started while she was at rest watching TV.  Pain was located to the left side of her chest and felt like a dull ache.  Patient also endorsed some left arm heaviness at that time.   Patient states that pain lasted proxy 5 minutes.  Pain did resolve after taking nitroglycerin.  Patient has not had any episodes of chest pain since then.     Dizziness Associated symptoms: chest pain   Associated symptoms: no blood in stool, no diarrhea, no headaches, no nausea, no palpitations, no shortness of breath, no vomiting and no weakness       Home Medications Prior to Admission medications   Medication Sig Start Date End Date Taking? Authorizing Provider  albuterol (PROVENTIL) (2.5 MG/3ML) 0.083% nebulizer solution Take 3 mLs (2.5 mg total) by nebulization every 6 (six) hours as needed for wheezing or shortness of breath. 08/18/21   Lillard Anes, MD  albuterol (VENTOLIN HFA) 108 (90 Base) MCG/ACT inhaler INHALE TWO PUFFS BY MOUTH INTO LUNGS EVERY 6 HOURS AS NEEDED FOR SHORTNESS OF BREATH OR wheezing 08/25/21   Lillard Anes, MD  ARIPiprazole (ABILIFY) 2 MG tablet Take 1 tablet (2 mg total) by mouth daily. Still take wellbutrin 08/11/21   Lillard Anes, MD  aspirin EC 81 MG tablet Take 1 tablet (81 mg total) by mouth daily. Swallow whole. 03/04/21   Tobb, Kardie, DO  Budeson-Glycopyrrol-Formoterol 160-9-4.8 MCG/ACT AERO Inhale 2 puffs into the lungs 2 (two) times daily as needed (shortness of breath). 09/19/21   Lillard Anes, MD  buPROPion (WELLBUTRIN XL) 150 MG 24 hr tablet TAKE 1 TABLET(150 MG) BY MOUTH DAILY 03/24/21   [name]  Percell Miller, MD  cloNIDine (CATAPRES) 0.1 MG tablet Take 1 tablet (0.1 mg total) by mouth 2 (two) times daily. 03/24/21   Lillard Anes, MD  diazepam (VALIUM) 5 MG tablet TAKE 1 TABLET BY MOUTH EVERY 12 HOURS AS NEEDED FOR ANXIETY. WATCH FOR SEDATION 11/12/20   Lillard Anes, MD  isosorbide mononitrate (IMDUR) 30 MG 24 hr tablet Take 0.5 tablets (15 mg total) by mouth daily. 03/04/21   Tobb, Kardie, DO  lisinopril-hydrochlorothiazide (ZESTORETIC) 20-12.5 MG tablet Take 2 tablets by mouth every morning.  09/19/21   Lillard Anes, MD  nitroGLYCERIN (NITROSTAT) 0.4 MG SL tablet Place 1 tablet (0.4 mg total) under the tongue every 5 (five) minutes as needed for chest pain. 10/12/21 01/10/22  Lillard Anes, MD  omeprazole (PRILOSEC) 40 MG capsule Take 1 capsule (40 mg total) by mouth daily. 09/19/21   Lillard Anes, MD  rosuvastatin (CRESTOR) 10 MG tablet Take 1 tablet (10 mg total) by mouth daily. 10/12/21 04/10/22  Lillard Anes, MD  traZODone (DESYREL) 100 MG tablet Take 1 tablet (100 mg total) by mouth at bedtime. 09/19/21   Lillard Anes, MD      Allergies    Meperidine, Influenza vaccines, and Other    Review of Systems   Review of Systems  Constitutional:  Negative for chills and fever.  HENT:  Negative for facial swelling.   Eyes:  Positive for visual disturbance.  Respiratory:  Negative for shortness of breath.   Cardiovascular:  Positive for chest pain. Negative for palpitations and leg swelling.  Gastrointestinal:  Negative for abdominal distention, abdominal pain, anal bleeding, blood in stool, constipation, diarrhea, nausea, rectal pain and vomiting.  Genitourinary:  Negative for difficulty urinating, dysuria, flank pain, frequency, genital sores, hematuria, vaginal bleeding, vaginal discharge and vaginal pain.  Musculoskeletal:  Negative for back pain and neck pain.  Skin:  Negative for color change and rash.  Neurological:  Positive for dizziness and light-headedness. Negative for tremors, seizures, syncope, facial asymmetry, speech difficulty, weakness, numbness and headaches.  Psychiatric/Behavioral:  Negative for confusion.    Physical Exam Updated Vital Signs BP (!) 139/116 (BP Location: Right Arm)   Pulse 66   Temp 97.8 F (36.6 C)   Resp 17   Ht '5\' 2"'$  (1.575 m)   Wt 63 kg   SpO2 99%   BMI 25.42 kg/m  Physical Exam Vitals and nursing note reviewed.  Constitutional:      General: She is not in acute distress.    Appearance:  She is not ill-appearing, toxic-appearing or diaphoretic.  HENT:     Head: Normocephalic and atraumatic.  Eyes:     General:        Right eye: No discharge.        Left eye: No discharge.     Extraocular Movements: Extraocular movements intact.     Right eye: Nystagmus present.     Left eye: Nystagmus present.     Conjunctiva/sclera: Conjunctivae normal.     Pupils: Pupils are equal, round, and reactive to light.     Comments: Horizontal nystagmus present to bilateral eyes  Cardiovascular:     Rate and Rhythm: Normal rate.     Pulses:          Radial pulses are 2+ on the right side and 2+ on the left side.     Heart sounds: Normal heart sounds, S1 normal and S2 normal. Heart sounds not distant. No murmur heard. Pulmonary:  Effort: Pulmonary effort is normal.  Abdominal:     General: Abdomen is flat. There is no distension. There are no signs of injury.     Palpations: Abdomen is soft. There is no mass or pulsatile mass.     Tenderness: There is no abdominal tenderness. There is no guarding or rebound.     Hernia: There is no hernia in the umbilical area or ventral area.  Musculoskeletal:     Cervical back: Normal range of motion and neck supple. No rigidity.  Skin:    General: Skin is warm and dry.  Neurological:     General: No focal deficit present.     Mental Status: She is alert and oriented to person, place, and time.     GCS: GCS eye subscore is 4. GCS verbal subscore is 5. GCS motor subscore is 6.     Cranial Nerves: No cranial nerve deficit or facial asymmetry.     Sensory: Sensation is intact.     Motor: No weakness, tremor, seizure activity or pronator drift.     Coordination: Romberg sign negative. Finger-Nose-Finger Test normal.     Comments: CN II-XII intact; performed in supine position due to dizziness, +5 strength to bilateral upper extremities, +5 strength to dorsiflexion and plantarflexion, patient able to lift both legs against gravity and hold each there  without difficulty   Psychiatric:        Behavior: Behavior is cooperative.    ED Results / Procedures / Treatments   Labs (all labs ordered are listed, but only abnormal results are displayed) Labs Reviewed  COMPREHENSIVE METABOLIC PANEL - Abnormal; Notable for the following components:      Result Value   Sodium 132 (*)    Potassium 3.4 (*)    Chloride 97 (*)    Glucose, Bld 116 (*)    Total Protein 6.0 (*)    All other components within normal limits  CBC WITH DIFFERENTIAL/PLATELET  TROPONIN I (HIGH SENSITIVITY)    EKG EKG Interpretation  Date/Time:  Saturday Oct 15 2021 10:18:23 EDT Ventricular Rate:  65 PR Interval:  181 QRS Duration: 109 QT Interval:  395 QTC Calculation: 411 R Axis:   39 Text Interpretation: Sinus rhythm Atrial premature complexes RSR' in V1 or V2, right VCD or RVH Confirmed by Dene Gentry [phone]) on 10/15/2021 10:58:20 AM  Radiology CT HEAD WO CONTRAST (5MM)  Result Date: 10/15/2021 CLINICAL DATA:  Neuro deficit, acute, stroke suspected EXAM: CT HEAD WITHOUT CONTRAST TECHNIQUE: Contiguous axial images were obtained from the base of the skull through the vertex without intravenous contrast. RADIATION DOSE REDUCTION: This exam was performed according to the departmental dose-optimization program which includes automated exposure control, adjustment of the mA and/or kV according to patient size and/or use of iterative reconstruction technique. COMPARISON:  07/22/2020 FINDINGS: Brain: There is no acute intracranial hemorrhage, mass effect, or edema. Gray-white differentiation is preserved. There is no extra-axial fluid collection. Ventricles and sulci are within normal limits in size and configuration. Patchy hypoattenuation in the supratentorial white matter is nonspecific but may reflect similar mild chronic microvascular ischemic changes. Unchanged subcentimeter meningioma with calcification along the left aspect of the falx. Vascular: There is  atherosclerotic calcification at the skull base. Skull: Calvarium is unremarkable. Sinuses/Orbits: No acute finding. Other: None. IMPRESSION: No acute intracranial hemorrhage or evidence of acute infarction. Mild chronic microvascular ischemic changes. Unchanged calcified subcentimeter falcine meningioma. Electronically Signed   By: Macy Mis M.D.   On: 10/15/2021 12:23  MR BRAIN WO CONTRAST  Result Date: 10/15/2021 CLINICAL DATA:  Neuro deficit, acute, stroke suspected EXAM: MRI HEAD WITHOUT CONTRAST TECHNIQUE: Multiplanar, multiecho pulse sequences of the brain and surrounding structures were obtained without intravenous contrast. COMPARISON:  2019 FINDINGS: Motion artifact is present. Brain: There is no acute infarction or intracranial hemorrhage. There is no mass effect or edema. There is no hydrocephalus or extra-axial fluid collection. Ventricles and sulci are within normal limits in size and configuration. Small 1 cm calcified meningioma is identified along the falx. Patchy foci of T2 hyperintensity in the supratentorial white matter are nonspecific but may reflect mild chronic microvascular ischemic changes similar to the prior study. Vascular: Major vessel flow voids at the skull base are preserved. Skull and upper cervical spine: Normal marrow signal is preserved. Sinuses/Orbits: Paranasal sinuses are aerated. Orbits are unremarkable. Other: Sella is empty and expanded. Minor mastoid fluid opacification. IMPRESSION: No evidence of recent infarction or hemorrhage. Similar mild chronic microvascular ischemic changes. Similar small falcine meningioma. Electronically Signed   By: Macy Mis M.D.   On: 10/15/2021 13:30   DG Chest Portable 1 View  Result Date: 10/15/2021 CLINICAL DATA:  Dizziness and chest pain. EXAM: PORTABLE CHEST 1 VIEW COMPARISON:  07/22/2020 and prior studies FINDINGS: The cardiomediastinal silhouette is unremarkable. Mild elevation of the LEFT hemidiaphragm again noted.  There is no evidence of focal airspace disease, pulmonary edema, suspicious pulmonary nodule/mass, pleural effusion, or pneumothorax. No acute bony abnormalities are identified. IMPRESSION: No active disease. Electronically Signed   By: Margarette Canada M.D.   On: 10/15/2021 10:50    Procedures Procedures    Medications Ordered in ED Medications  meclizine (ANTIVERT) tablet 25 mg (25 mg Oral Given 10/15/21 1232)  LORazepam (ATIVAN) injection 0.5 mg (0.5 mg Intravenous Given 10/15/21 1232)    ED Course/ Medical Decision Making/ A&P                           Medical Decision Making Amount and/or Complexity of Data Reviewed Labs: ordered. Radiology: ordered.  Risk Prescription drug management.   Alert 76 year old female in no acute distress, nontoxic-appearing.  Presents to the ED with a chief complaint of chest pain and dizziness.  Information obtained from patient and patient's granddaughter at bedside.  Past medical records were reviewed including previous divider notes, labs, and imaging.  Patient has medical history as outlined in HPI which complicates her care.  Due to patient's report of dizziness, balance issues, and episode of slurred speech on Tuesday will obtain MRI of brain to evaluate for any acute CVA.  Due to patient's chest pain  I personally viewed and interpreted patient's EKG.  Tracing shows sinus rhythm.  I personally viewed and interpreted patient's x-ray imaging.  Imaging shows no active cardiopulmonary disease.  I personally viewed and interpreted patient's lab results.  Pertinent findings include: -CBC unremarkable  -No large electrolyte abnormality -Troponin 5  I personally viewed and interpreted patient CT imaging.  Imaging shows no acute intracranial abnormality.  MRI imaging shows no acute intracranial abnormality.  On serial reexamination patient continues to have no focal neurological deficits.  Patient reports that she does not have any dizziness at  present.  Patient able to stand and ambulate with assistance.  Will discharge with prescription of meclizine.  Patient to follow-up closely with PCP in the outpatient setting.  Patient care discussed with attending physician Dr.Messick         Final Clinical Impression(s) /  ED Diagnoses Final diagnoses:  Dizziness  Precordial chest pain    Rx / DC Orders ED Discharge Orders          Ordered    meclizine (ANTIVERT) 25 MG tablet  3 times daily PRN        10/15/21 1409              Loni Beckwith, PA-C 10/15/21 1647    Valarie Merino, MD 10/19/21 [phone]

## 2021-10-15 NOTE — ED Notes (Signed)
Patient provided bedside commode.

## 2021-10-18 ENCOUNTER — Telehealth: Payer: Self-pay

## 2021-10-18 NOTE — Progress Notes (Unsigned)
Chronic Care Management Pharmacy Assistant   Name: [name] [name]  MRN: [id number] DOB: [birth year]   Reason for Encounter: Medication Coordination for [facility]    Recent office visits:  10/12/21 [name] MD. Seen for routine visit. Referral to [facility]. D/C Ondansetron HCI '4mg'$  and Promethazine HCI '25mg'$ .  Recent consult visits:  None  Hospital visits:  Medication Reconciliation was completed by comparing discharge summary, patient's EMR and Pharmacy list, and upon discussion with patient.  Admitted to the hospital on 10/15/21 due to Dizziness. Discharge date was 10/15/21. Discharged from [facility]?Medications Started at [facility] Discharge:?? -started Meclizine HCI '25mg'$   -All other medications will remain the same.    Medications: Outpatient Encounter Medications as of 10/18/2021  Medication Sig   albuterol (PROVENTIL) (2.5 MG/3ML) 0.083% nebulizer solution Take 3 mLs (2.5 mg total) by nebulization every 6 (six) hours as needed for wheezing or shortness of breath.   albuterol (VENTOLIN HFA) 108 (90 Base) MCG/ACT inhaler INHALE TWO PUFFS BY MOUTH INTO LUNGS EVERY 6 HOURS AS NEEDED FOR SHORTNESS OF BREATH OR wheezing   ARIPiprazole (ABILIFY) 2 MG tablet Take 1 tablet (2 mg total) by mouth daily. Still take wellbutrin   aspirin EC 81 MG tablet Take 1 tablet (81 mg total) by mouth daily. Swallow whole.   Budeson-Glycopyrrol-Formoterol 160-9-4.8 MCG/ACT AERO Inhale 2 puffs into the lungs 2 (two) times daily as needed (shortness of breath).   buPROPion (WELLBUTRIN XL) 150 MG 24 hr tablet TAKE 1 TABLET(150 MG) BY MOUTH DAILY   cloNIDine (CATAPRES) 0.1 MG tablet Take 1 tablet (0.1 mg total) by mouth 2 (two) times daily.   diazepam (VALIUM) 5 MG tablet TAKE 1 TABLET BY MOUTH EVERY 12 HOURS AS NEEDED FOR ANXIETY. WATCH FOR SEDATION   isosorbide mononitrate (IMDUR) 30 MG 24 hr tablet Take 0.5 tablets (15 mg total) by mouth daily.    lisinopril-hydrochlorothiazide (ZESTORETIC) 20-12.5 MG tablet Take 2 tablets by mouth every morning.   meclizine (ANTIVERT) 25 MG tablet Take 1 tablet (25 mg total) by mouth 3 (three) times daily as needed for dizziness.   nitroGLYCERIN (NITROSTAT) 0.4 MG SL tablet Place 1 tablet (0.4 mg total) under the tongue every 5 (five) minutes as needed for chest pain.   omeprazole (PRILOSEC) 40 MG capsule Take 1 capsule (40 mg total) by mouth daily.   rosuvastatin (CRESTOR) 10 MG tablet Take 1 tablet (10 mg total) by mouth daily.   traZODone (DESYREL) 100 MG tablet Take 1 tablet (100 mg total) by mouth at bedtime.   No facility-administered encounter medications on file as of 10/18/2021.    Reviewed chart for medication changes ahead of medication coordination call.  No Consults visits since last care coordination call/Pharmacist visit.   BP Readings from Last 3 Encounters:  10/15/21 (!) 109/54  10/12/21 124/60  06/23/21 138/70    Lab Results  Component Value Date   HGBA1C 5.3 10/12/2021     Patient obtains medications through Adherence Packaging  30 Days   Last adherence delivery included:  Aripiprazole '2mg'$  1 at breakfast Isosorbide Monoitrate 30 mg 0.5 tablet at Breakfast  Rosuvastatin 10 mg 1 at bedtime Clonidine 0.1 mg 1 at breakfast and 1 at Bedtime Bupropion XL 150 mg 1 at breakfast Trazodone 100 mg 1 at bedtime prn (Vials) Lisinopril-HCTZ 20-12.5 mg 2 at breakfast Omeprazole 40 mg 1 at breakfast  Alubterol Inhaler 23mg 2 puffs every 6hrs prn   Patient declined (  meds) last month  Aspirin -Gets OTC Albuterol Solution- Has plenty only uses prn  Promethazine '25mg'$ - Not using right now, only taking omeprazole  Valium '5mg'$ - Not currently taking  Budesonide/glycopyrrl/formot 160-9-48 inhaler- Pt still has plenty of supply and can wait until next delivery  Patient is due for next adherence delivery on: 10/28/21. Called patient and reviewed medications and coordinated delivery.  This  delivery to include: Aripiprazole '2mg'$  1 at breakfast Isosorbide Monoitrate 30 mg 0.5 tablet at Breakfast  Rosuvastatin 10 mg 1 at bedtime Clonidine 0.1 mg 1 at breakfast and 1 at Bedtime Bupropion XL 150 mg 1 at breakfast Trazodone 100 mg 1 at bedtime prn (Vials) Lisinopril-HCTZ 20-12.5 mg 2 at breakfast Omeprazole 40 mg 1 at breakfast  Alubterol Inhaler 43mg 2 puffs every 6hrs prn  Nitroglycerin 0.'4mg'$  1 tab every 5 mins as needed   Patient declined the following medications  Meclizine '25mg'$ - Received a temporary supply on 10/17/21 10ds Aspirin -Gets OTC  Patient needs refills  None  Confirmed delivery date of ***, advised patient that pharmacy will contact them the morning of delivery.    DElray Mcgregor CEltonPharmacist Assistant  [phone]

## 2021-10-20 ENCOUNTER — Other Ambulatory Visit: Payer: Self-pay | Admitting: Legal Medicine

## 2021-10-20 ENCOUNTER — Ambulatory Visit (INDEPENDENT_AMBULATORY_CARE_PROVIDER_SITE_OTHER): Payer: Medicare Other

## 2021-10-20 DIAGNOSIS — I10 Essential (primary) hypertension: Secondary | ICD-10-CM

## 2021-10-20 DIAGNOSIS — F322 Major depressive disorder, single episode, severe without psychotic features: Secondary | ICD-10-CM

## 2021-10-20 DIAGNOSIS — I2510 Atherosclerotic heart disease of native coronary artery without angina pectoris: Secondary | ICD-10-CM

## 2021-10-20 NOTE — Progress Notes (Signed)
Chronic Care Management Pharmacy Note  10/20/2021 Name:  [name] [name] MRN:  [id number] DOB:  1945-06-14   Plan Recommendations:  Please consider rechecking vitamin D with next labs. Patient states she completed high dose vitamin D regimen but has not continued to supplement.  Recommend DEXA scan as well Patient went to ER due to dizziness and is very unsteady on her feet today. Most likely due to Aripiprazole. Could we try an alternative therapy, this is on the [facility] list Could we consider increasing her statin in the future due to her risk factors  Subjective: [name] [name] is an 76 y.o. year old female who is a primary patient of Henrene Pastor, Zeb Comfort, MD.  The CCM team was consulted for assistance with disease management and care coordination needs.    Engaged with patient by telephone for follow up visit in response to provider referral for pharmacy case management and/or care coordination services.   Consent to Services:  The patient was given the following information about Chronic Care Management services today, agreed to services, and gave verbal consent: 1. CCM service includes personalized support from designated clinical staff supervised by the primary care provider, including individualized plan of care and coordination with other care providers 2. 24/7 contact phone numbers for assistance for urgent and routine care needs. 3. Service will only be billed when office clinical staff spend 20 minutes or more in a month to coordinate care. 4. Only one practitioner may furnish and bill the service in a calendar month. 5.The patient may stop CCM services at any time (effective at the end of the month) by phone call to the office staff. 6. The patient will be responsible for cost sharing (co-pay) of up to 20% of the service fee (after annual deductible is met). Patient agreed to services and consent obtained.  Patient Care Team: Lillard Anes, MD as PCP - General (Family  Medicine) Berniece Salines, DO as PCP - Cardiology (Cardiology) Lane Hacker, [facility] as Pharmacist (Pharmacist)  Recent office visits: 07/14/2020 - CBC normal, kidney and liver tests normal, A1c 5.1 OK, LDL cholesterol 100.  04/15/2020 - depression and grief.  03/30/2020 - gastroenteritis - promethazine and depression.  Recent consult visits: 08/04/2020 - cardiology - symptoms persist recommend proceeding with Coronary CTA. Smoking cessation available.  08/02/2020 - Pulmonology - full set of PFTs at follow-up. Continue Breztri and albuterol.  Hospital visits:  07/22/2020 - ed to hospital admission for chest pain.  03/20/2020 - ED visit    Objective:  Lab Results  Component Value Date   CREATININE 0.73 10/15/2021   BUN 10 10/15/2021   GFRNONAA >60 10/15/2021   GFRAA 100 07/14/2020   NA 132 (L) 10/15/2021   K 3.4 (L) 10/15/2021   CALCIUM 9.2 10/15/2021   CO2 29 10/15/2021   GLUCOSE 116 (H) 10/15/2021    Lab Results  Component Value Date/Time   HGBA1C 5.3 10/12/2021 04:57 PM   HGBA1C 5.3 05/05/2021 02:53 PM    Last diabetic Eye exam: No results found for: HMDIABEYEEXA  Last diabetic Foot exam: No results found for: HMDIABFOOTEX   Lab Results  Component Value Date   CHOL 187 10/12/2021   HDL 73 10/12/2021   LDLCALC 103 (H) 10/12/2021   TRIG 59 10/12/2021   CHOLHDL 2.6 10/12/2021       Latest Ref Rng & Units 10/15/2021   10:25 AM 10/12/2021    4:57 PM 05/05/2021    2:53 PM  Hepatic Function  Total  Protein 6.5 - 8.1 g/dL 6.0   6.1   6.0    Albumin 3.5 - 5.0 g/dL 3.5   4.1   4.2    AST 15 - 41 U/L _0 ALT 0 - 44 U/L _1 Alk Phosphatase 38 - 126 U/L 52   61   66    Total Bilirubin 0.3 - 1.2 mg/dL 0.5   0.5   0.5      No results found for: TSH, FREET4     Latest Ref Rng & Units 10/15/2021   10:25 AM 10/12/2021    4:57 PM 05/05/2021    2:53 PM  CBC  WBC 4.0 - 10.5 K/uL 4.7   6.2   6.2    Hemoglobin 12.0 - 15.0 g/dL 12.8   13.1   14.3     Hematocrit 36.0 - 46.0 % 37.7   38.4   41.1    Platelets 150 - 400 K/uL 174   205   192      Lab Results  Component Value Date/Time   VD25OH 17.3 (L) 12/10/2019 03:34 PM    Clinical ASCVD: No  The 10-year ASCVD risk score (Arnett DK, et al., 2019) is: 38.5%   Values used to calculate the score:     Age: 76 years     Sex: Female     Is Non-Hispanic African American: No     Diabetic: Yes     Tobacco smoker: Yes     Systolic Blood Pressure: 419 mmHg     Is BP treated: Yes     HDL Cholesterol: 73 mg/dL     Total Cholesterol: 187 mg/dL       08/11/2021   11:14 AM 05/05/2021    2:40 PM 01/18/2021    2:01 PM  Depression screen PHQ 2/9  Decreased Interest _2 Down, Depressed, Hopeless _3 PHQ - 2 Score _4 Altered sleeping _5 Tired, decreased energy _6 Change in appetite _7 Feeling bad or failure about yourself  3 1 0  Trouble concentrating _8 Moving slowly or fidgety/restless _9 Suicidal thoughts 0 0 0  PHQ-9 Score _10 Difficult doing work/chores Very difficult Somewhat difficult Somewhat difficult     Social History   Tobacco Use  Smoking Status Every Day   Packs/day: 1.00   Years: 47.00   Pack years: 47.00   Types: Cigarettes  Smokeless Tobacco Never  Tobacco Comments   Smokes 1 pack daily 01/04/21 ARJ    BP Readings from Last 3 Encounters:  10/15/21 (!) 109/54  10/12/21 124/60  06/23/21 138/70   Pulse Readings from Last 3 Encounters:  10/15/21 61  10/12/21 67  06/23/21 76   Wt Readings from Last 3 Encounters:  10/15/21 139 lb (63 kg)  10/12/21 139 lb (63 kg)  06/23/21 144 lb 4 oz (65.4 kg)   BMI Readings from Last 3 Encounters:  10/15/21 25.42 kg/m  10/12/21 25.42 kg/m  06/23/21 26.38 kg/m    Assessment/Interventions: Review of patient past medical history, allergies, medications, health status, including review of consultants reports, laboratory and other test data, was performed as part of comprehensive  evaluation and provision of chronic care management services.   SDOH:  (Social Determinants of Health) assessments and interventions performed: Yes  SDOH Interventions    Flowsheet Row Most Recent Value  SDOH Interventions   Financial Strain Interventions Intervention Not Indicated      SDOH Screenings   Alcohol Screen: Not on file  Depression (PHQ2-9): Medium Risk   PHQ-2 Score: 22  Financial Resource Strain: Low Risk    Difficulty of Paying Living Expenses: Not hard at all  Food Insecurity: Not on file  Housing: Not on file  Physical Activity: Not on file  Social Connections: Not on file  Stress: Not on file  Tobacco Use: High Risk   Smoking Tobacco Use: Every Day   Smokeless Tobacco Use: Never   Passive Exposure: Not on file  Transportation Needs: Unmet Transportation Needs   Lack of Transportation (Medical): Yes   Lack of Transportation (Non-Medical): Yes    CCM Care Plan  Allergies  Allergen Reactions   Meperidine Anaphylaxis and Other (See Comments)    Blood pressure dropped, also   Influenza Vaccines Other (See Comments)    Flu-like symptoms   Other Rash and Other (See Comments)    asparagus    Medications Reviewed Today     Reviewed by Augustin Coupe, CMA (Certified Medical Assistant) on 10/12/21 at [place] List Status: <None>   Medication Order Taking? Sig Documenting Provider Last Dose Status Informant  albuterol (PROVENTIL) (2.5 MG/3ML) 0.083% nebulizer solution [id number] Yes Take 3 mLs (2.5 mg total) by nebulization every 6 (six) hours as needed for wheezing or shortness of breath. Lillard Anes, MD Taking Active   albuterol [facility]) 108 [phone] Base) MCG/ACT inhaler [id number] Yes INHALE TWO PUFFS BY MOUTH INTO LUNGS EVERY 6 HOURS AS NEEDED FOR SHORTNESS OF BREATH OR wheezing Lillard Anes, MD Taking Active   ARIPiprazole (ABILIFY) 2 MG tablet [id number] Yes Take 1 tablet (2 mg total) by mouth daily. Still take wellbutrin Lillard Anes, MD Taking Active   aspirin EC 81 MG tablet [id number] Yes Take 1 tablet (81 mg total) by mouth daily. Swallow whole. Berniece Salines, DO Taking Active   Budeson-Glycopyrrol-Formoterol 160-9-4.8 MCG/ACT AERO [id number] Yes Inhale 2 puffs into the lungs 2 (two) times daily as needed (shortness of breath). Lillard Anes, MD Taking Active   buPROPion [facility] XL) 150 MG 24 hr tablet [id number] Yes TAKE 1 TABLET(150 MG) BY MOUTH DAILY Lillard Anes, MD Taking Active   cloNIDine (CATAPRES) 0.1 MG tablet [id number] Yes Take 1 tablet (0.1 mg total) by mouth 2 (two) times daily. Lillard Anes, MD Taking Active   diazepam (VALIUM) 5 MG tablet [id number] Yes TAKE 1 TABLET BY MOUTH EVERY 12 HOURS AS NEEDED FOR ANXIETY. WATCH FOR SEDATION Lillard Anes, MD Taking Active Self  isosorbide mononitrate (IMDUR) 30 MG 24 hr tablet [id number] Yes Take 0.5 tablets (15 mg total) by mouth daily. Tobb, Kardie, DO Taking Active   lisinopril-hydrochlorothiazide (ZESTORETIC) 20-12.5 MG tablet [id number] Yes Take 2 tablets by mouth every morning. Lillard Anes, MD Taking Active   nitroGLYCERIN (NITROSTAT) 0.4 MG SL tablet [id number]  Place 1 tablet (0.4 mg total) under the tongue every 5 (five) minutes as needed for chest pain. Tobb, Kardie, DO  Active   omeprazole (PRILOSEC) 40 MG capsule [id number] Yes Take 1 capsule (40 mg total) by mouth daily. Lillard Anes, MD Taking Active   rosuvastatin (CRESTOR) 10 MG tablet [id number]  Take 1 tablet (10 mg total) by mouth daily. Berniece Salines, DO  Active   traZODone (DESYREL) 100 MG tablet [id number] Yes Take  1 tablet (100 mg total) by mouth at bedtime. Lillard Anes, MD Taking Active             Patient Active Problem List   Diagnosis Date Noted   BMI 28.0-28.9,adult 05/05/2021   S/P repair of paraesophageal hernia 02/22/2021   Coronary artery disease involving native coronary artery of native heart without  angina pectoris 11/18/2020   Cataract 11/16/2020   Osteoarthritis    Enlarged aorta (HCC)    Depression, major, single episode, severe ([place]) 03/30/2020   PAC (premature atrial contraction) 02/03/2020   PAT (paroxysmal atrial tachycardia) ([place]) 64/40/3474   Diastolic dysfunction 25/95/6387   Obesity (BMI 30-39.9) 08/19/2019   Tobacco use 08/19/2019   Type 2 diabetes mellitus with other specified complication ([place]) 56/43/3295   Hyperlipidemia 08/07/2019   Essential hypertension 08/07/2019   COPD (chronic obstructive pulmonary disease) ([place]) 08/07/2019   GERD with esophagitis 08/07/2019   Degenerative joint disease involving multiple joints 09/28/2017   Long-term current use of opiate analgesic 06/25/2017   Pain of right hip joint 11/22/2015   Chronic left shoulder pain 09/27/2015   S/P total knee arthroplasty 02/15/2015   Primary osteoarthritis of left knee 02/04/2015    Immunization History  Administered Date(s) Administered   Fluad Quad(high Dose 65+) 03/30/2020, 05/05/2021   Influenza, High Dose Seasonal PF 03/28/2018    Conditions to be addressed/monitored:  Hypertension, Hyperlipidemia, Diabetes, GERD, COPD and Depression  Care Plan : [place]  Updates made by Lane Hacker, RPH since 10/20/2021 12:00 AM     Problem: htn, hld, copd   Priority: High  Onset Date: 09/03/2020     Long-Range Goal: Disease Management   Start Date: 09/03/2020  Expected End Date: 09/03/2021  Recent Progress: On track  Priority: High  Note:    Interventions: 1:1 collaboration with Lillard Anes, MD regarding development and update of comprehensive plan of care as evidenced by provider attestation and co-signature Inter-disciplinary care team collaboration (see longitudinal plan of care) Comprehensive medication review performed; medication list updated in electronic medical record  Hypertension (BP goal <140/90) -Controlled -Current  treatment: lisinopril-hydrochlorothiazide 20-12.5 mg 2 tablets daily Appropriate, Effective, Safe, Accessible Clonidine 0.1 mg bid Appropriate, Effective, Safe, Accessible ISMN 33m take 1/2 QD Appropriate, Effective, Safe, Accessible -Medications previously tried:  amlodipine, hydrochlorothiazide, furosemide,  -Current home readings:  has been good at home. Typically checks weekly.  -Current dietary habits: trying to eat heart healthy and lower sodium.  -Current exercise habits:  has done home PT before. She knows how to do chair exercises and does them at home 1-2 times daily.  -Denies hypotensive/hypertensive symptoms -Educated on BP goals and benefits of medications for prevention of heart attack, stroke and kidney damage; Daily salt intake goal < 2300 mg; Exercise goal of 150 minutes per week; Importance of home blood pressure monitoring; -Counseled to monitor BP at home weekly, document, and provide log at future appointments -Counseled on diet and exercise extensively Recommended to continue current medication Recommended checking blood pressure weekly and recording.   CAD: (LDL goal < 100) The 10-year ASCVD risk score (Arnett DK, et al., 2019) is: 38.5%   Values used to calculate the score:     Age: [age]years     Sex: Female     Is Non-Hispanic African American: No     Diabetic: Yes     Tobacco smoker: Yes     Systolic Blood Pressure: 1188mmHg     Is BP treated: Yes  HDL Cholesterol: 73 mg/dL     Total Cholesterol: 187 mg/dL Lab Results  Component Value Date   CHOL 187 10/12/2021   CHOL 143 05/05/2021   CHOL 152 01/18/2021   Lab Results  Component Value Date   HDL 73 10/12/2021   HDL 64 05/05/2021   HDL 61 01/18/2021   Lab Results  Component Value Date   LDLCALC 103 (H) 10/12/2021   LDLCALC 66 05/05/2021   LDLCALC 80 01/18/2021   Lab Results  Component Value Date   TRIG 59 10/12/2021   TRIG 62 05/05/2021   TRIG 53 01/18/2021   Lab Results  Component  Value Date   CHOLHDL 2.6 10/12/2021   CHOLHDL 2.2 05/05/2021   CHOLHDL 2.5 01/18/2021  No results found for: LDLDIRECT -Controlled -Current treatment: Rosuvastatin 10 mg daily Appropriate, Effective, Safe, Accessible ASA 62m Appropriate, Effective, Safe, Accessible -Medications previously tried: lovastatin, atorvastatin -Current dietary patterns:  trying to abide by heart healthy and lower sodium. Doesn't eat breakfast. CColusawith fruit for lunch. Drinking Ensure. Eats oatmeal. Frozen chicken nuggets and fries.  -Current exercise habits: chair exercises 1-2 times daily  -Educated on Cholesterol goals;  Benefits of statin for ASCVD risk reduction; Importance of limiting foods high in cholesterol; Exercise goal of 150 minutes per week; -Counseled on diet and exercise extensively September 2022: Candidate for higher statin with risk factors March 2023: Candidate for higher intensity statin May 2023: Recommended to PCP higher statin, PCP would like to maintain therapy for now  COPD (Goal: control symptoms and prevent exacerbations)    08/02/2020    2:19 PM  CAT Score  Total CAT Score 28   PF Readings from Last 3 Encounters:  No data found for PF  -Not ideally controlled -Current treatment  albuterol nebulizer solution 3 mls every 6 hours prn wheezing or shortness of breath Appropriate, Effective, Safe, Accessible Albuterol inhaler 2 puffs into lungs every 6 hours prn wheezing/shortness of breath Appropriate, Effective, Safe, Accessible Breztri 160/9/4.8 mcg/act 2 puffs into the lungs am and bedtime Appropriate, Effective, Safe, Accessible -Medications previously tried: none reported  -Pulmonary function testing: ordered with pulmonology for 10/2020 -Frequency of rescue inhaler use: daily with exertion -Counseled on Proper inhaler technique; Benefits of consistent maintenance inhaler use smoking cessation.  -Counseled on diet and exercise extensively Recommended to  continue current medication Counseled on smoking cessation. Smoking 1.5 packs per day.  March 2023: Patient's main priorities today were insomnia/mood, will push back COPD assessment to f/u appt  Depression/Anxiety (Goal: manage symptoms of depression and anxiety) -Controlled -Current treatment: bupropion xl 150 mg daily Appropriate, Query effective, Safe, Accessible Diazepam 5 mg every 12 hours prn anxiety (Once every other week per patient, if she is going to WTriplettor something) Appropriate, Effective, Safe, Accessible Aripiprazole 21mAM Query Appropriate, Query effective, Query Safe, Accessible -Medications previously tried/failed: Savella, sertraline, trazodone    08/11/2021   11:14 AM 05/05/2021    2:40 PM 01/18/2021    2:01 PM  Depression screen PHQ 2/9  Decreased Interest _0 Down, Depressed, Hopeless _1 PHQ - 2 Score _2 Altered sleeping _3 Tired, decreased energy _4 Change in appetite _5 Feeling bad or failure about yourself  3 1 0  Trouble concentrating _6 Moving slowly or fidgety/restless _7 Suicidal thoughts 0 0 0  PHQ-9 Score _8 Difficult doing work/chores  Very difficult Somewhat difficult Somewhat difficult      09/03/2020   10:49 AM  GAD 7 : Generalized Anxiety Score  Nervous, Anxious, on Edge 2  Control/stop worrying 2  Worry too much - different things 2  Trouble relaxing 1  Restless 1  Easily annoyed or irritable 1  Afraid - awful might happen 1  Total GAD 7 Score 10  Anxiety Difficulty Somewhat difficult  -Educated on Benefits of medication for symptom control -Counseled on diet and exercise extensively September 2022: Will ask PCP about increasing therapy March 2023: Patient's son passed November 2022, is unable to sleep at night because mind is racing. PHQ9 has increased. Will try to coordinate with PCP to increase therapy May 2023: Patient visited ER for dizziness, has been going on a few weeks. Most likely  Aripiprazole, will let PCP know  Insomnia (Goal: 7-8 hours of sleep/night) -Uncontrolled -Patient currently getting 3 hours of sleep/night -Patient currently waking up 0 times/night -Current treatment: Trazodone 152m HS Appropriate, Query effective, Safe, Accessible -Medications previously tried: N/A -Counseled on sleep hygiene techniques (consistent sleep/wake up schedule, no electronic screens 1-2 hours before bed, etc) March 2023: Patient getting 3 hours of sleep/night. Goes to bed at 2000 and watches TV for a few hours until 2230 in bed. Is unable to sleep until 0300. Counsled on sleep hygiene (Will watch TV in living room and colour for 30 mins before bed). Patient states she can't sleep because her mind is racing (Son passed November 2022), will coordinate with PCP to increase mental health therapy   GERD (Goal: manage symptoms of GERD) -Not ideally controlled -Current treatment  Omeprazole 40 mg daily Appropriate, Effective, Safe, Accessible Promethazine 25 mg every 6 hours prn nausea/vomiting Appropriate, Effective, Safe, Accessible -Medications previously tried:  Linzess, metoclopramide,  -Counseled on diet and exercise extensively Recommended to continue current medication September 2022: Just had Hiatal Hernia Sx, defer to specialist  Tobacco use (Goal reduce cigarette use) -Uncontrolled -Previous quit attempts: none reported -Current treatment  Patient take bupropion for depression and is not ready to quit at this time  -Patient smokes Within 30 minutes of waking -Patient triggers include: stress, anxiety and sadness and craving the taste of a cigarette and needing to do something with your hands/mouth -On a scale of 1-10, reports MOTIVATION to quit is 0 -On a scale of 1-10, reports CONFIDENCE in quitting is 0 -Provided contact information for [place] Quit Line (1-800-QUIT-NOW) and encouraged patient to reach out to this group for support. -Educated on options when patient is  ready. Patient hopes to maybe begin with reducing from 1.5 packs per day. She is not ready to begin cessation at this time and will contact office when she is ready.  May 2023: Patient not ready to quit, would like to try smoking 1/2 PPD (Is at 1PPD now)  Osteoporosis / Osteopenia (Goal minimize risk of breaks or fractures) Last vitamin D Lab Results  Component Value Date   VD25OH 17.3 (L) 12/10/2019  -Last DEXA Scan:  has had one a long time ago. Needs updated scan completed within CPhoenixto allow for transportation. .   -Current treatment  Diet  -Medications previously tried:  vitamin d -Recommend [phone] units of vitamin D daily. Recommend 1200 mg of calcium daily from dietary and supplemental sources. Recommend weight-bearing and muscle strengthening exercises for building and maintaining bone density. -Counseled on diet and exercise extensively March 2023: Recommended updated Dexa scan and Vitamin D level drawn with next  labs.  May 2023: Same recommendations   Patient Goals/Self-Care Activities Patient will:  - take medications as prescribed focus on medication adherence by using packaing and delivery through [facility].  check blood pressure weekly, document, and provide at future appointments target a minimum of 150 minutes of moderate intensity exercise weekly engage in dietary modifications by heart-healthy, low-sodium diet.   Follow Up Plan: Telephone follow up appointment with care management team member scheduled for: August 2023  [place] Constable, [place].D. - [phone]       Medication Assistance: None required.  Patient affirms current coverage meets needs.  Patient's preferred pharmacy is:  [facility] - [place], [place] - [address] [place] [address] Phone: [phone] Fax: [phone]  [facility] [place], [place] - [address] AT [address] [place] [address] [place] [address] Phone: [phone] Fax: [phone]   Uses pill box? Yes Pt endorses misssed doses or taking later than scheduled.   Meds: Patient was with [facility] but went back to [facility]. She stated that the person who used to drive her to the Pharmacy can no longer and she'd like to go back to [facility]. Plan: Verbal consent obtained for [facility] enhanced pharmacy services (medication synchronization, adherence packaging, delivery coordination). A medication sync plan was created to allow patient to get all medications delivered once every 30 to 90 days per patient preference. Patient understands they have freedom to choose pharmacy and clinical pharmacist will coordinate care between all prescribers and [facility].  Care Plan and Follow Up Patient Decision:  Patient agrees to Care Plan and Follow-up.  Plan: Telephone follow up appointment with care management team member scheduled for:  August 2023  [place] Constable, [place].D. - [phone]

## 2021-10-20 NOTE — Patient Instructions (Signed)
Visit Information   Goals Addressed   None    Patient Care Plan: CCM Pharmacy Care Plan     Problem Identified: htn, hld, copd   Priority: High  Onset Date: 09/03/2020     Long-Range Goal: Disease Management   Start Date: 09/03/2020  Expected End Date: 09/03/2021  Recent Progress: On track  Priority: High  Note:    Interventions: 1:1 collaboration with Lillard Anes, MD regarding development and update of comprehensive plan of care as evidenced by provider attestation and co-signature Inter-disciplinary care team collaboration (see longitudinal plan of care) Comprehensive medication review performed; medication list updated in electronic medical record  Hypertension (BP goal <140/90) -Controlled -Current treatment: lisinopril-hydrochlorothiazide 20-12.5 mg 2 tablets daily Appropriate, Effective, Safe, Accessible Clonidine 0.1 mg bid Appropriate, Effective, Safe, Accessible ISMN '30mg'$  take 1/2 QD Appropriate, Effective, Safe, Accessible -Medications previously tried:  amlodipine, hydrochlorothiazide, furosemide,  -Current home readings:  has been good at home. Typically checks weekly.  -Current dietary habits: trying to eat heart healthy and lower sodium.  -Current exercise habits:  has done home PT before. She knows how to do chair exercises and does them at home 1-2 times daily.  -Denies hypotensive/hypertensive symptoms -Educated on BP goals and benefits of medications for prevention of heart attack, stroke and kidney damage; Daily salt intake goal < 2300 mg; Exercise goal of 150 minutes per week; Importance of home blood pressure monitoring; -Counseled to monitor BP at home weekly, document, and provide log at future appointments -Counseled on diet and exercise extensively Recommended to continue current medication Recommended checking blood pressure weekly and recording.   CAD: (LDL goal < 100) The 10-year ASCVD risk score (Arnett DK, et al., 2019) is: 38.5%    Values used to calculate the score:     Age: 76 years     Sex: Female     Is Non-Hispanic African American: No     Diabetic: Yes     Tobacco smoker: Yes     Systolic Blood Pressure: 878 mmHg     Is BP treated: Yes     HDL Cholesterol: 73 mg/dL     Total Cholesterol: 187 mg/dL Lab Results  Component Value Date   CHOL 187 10/12/2021   CHOL 143 05/05/2021   CHOL 152 01/18/2021   Lab Results  Component Value Date   HDL 73 10/12/2021   HDL 64 05/05/2021   HDL 61 01/18/2021   Lab Results  Component Value Date   LDLCALC 103 (H) 10/12/2021   LDLCALC 66 05/05/2021   LDLCALC 80 01/18/2021   Lab Results  Component Value Date   TRIG 59 10/12/2021   TRIG 62 05/05/2021   TRIG 53 01/18/2021   Lab Results  Component Value Date   CHOLHDL 2.6 10/12/2021   CHOLHDL 2.2 05/05/2021   CHOLHDL 2.5 01/18/2021  No results found for: LDLDIRECT -Controlled -Current treatment: Rosuvastatin 10 mg daily Appropriate, Effective, Safe, Accessible ASA '81mg'$  Appropriate, Effective, Safe, Accessible -Medications previously tried: lovastatin, atorvastatin -Current dietary patterns:  trying to abide by heart healthy and lower sodium. Doesn't eat breakfast. [place] with fruit for lunch. Drinking Ensure. Eats oatmeal. Frozen chicken nuggets and fries.  -Current exercise habits: chair exercises 1-2 times daily  -Educated on Cholesterol goals;  Benefits of statin for ASCVD risk reduction; Importance of limiting foods high in cholesterol; Exercise goal of 150 minutes per week; -Counseled on diet and exercise extensively September 2022: Candidate for higher statin with risk factors March 2023: Candidate for  higher intensity statin May 2023: Recommended to PCP higher statin, PCP would like to maintain therapy for now  COPD (Goal: control symptoms and prevent exacerbations)    08/02/2020    2:19 PM  CAT Score  Total CAT Score 28   PF Readings from Last 3 Encounters:  No data found for PF  -Not  ideally controlled -Current treatment  albuterol nebulizer solution 3 mls every 6 hours prn wheezing or shortness of breath Appropriate, Effective, Safe, Accessible Albuterol inhaler 2 puffs into lungs every 6 hours prn wheezing/shortness of breath Appropriate, Effective, Safe, Accessible Breztri 160/9/4.8 mcg/act 2 puffs into the lungs am and bedtime Appropriate, Effective, Safe, Accessible -Medications previously tried: none reported  -Pulmonary function testing: ordered with pulmonology for 10/2020 -Frequency of rescue inhaler use: daily with exertion -Counseled on Proper inhaler technique; Benefits of consistent maintenance inhaler use smoking cessation.  -Counseled on diet and exercise extensively Recommended to continue current medication Counseled on smoking cessation. Smoking 1.5 packs per day.  March 2023: Patient's main priorities today were insomnia/mood, will push back COPD assessment to f/u appt  Depression/Anxiety (Goal: manage symptoms of depression and anxiety) -Controlled -Current treatment: bupropion xl 150 mg daily Appropriate, Query effective, Safe, Accessible Diazepam 5 mg every 12 hours prn anxiety (Once every other week per patient, if she is going to [place] or something) Appropriate, Effective, Safe, Accessible Aripiprazole '2mg'$  AM Query Appropriate, Query effective, Query Safe, Accessible -Medications previously tried/failed: Savella, sertraline, trazodone    08/11/2021   11:14 AM 05/05/2021    2:40 PM 01/18/2021    2:01 PM  Depression screen PHQ 2/9  Decreased Interest '1 1 2  '$ Down, Depressed, Hopeless '3 2 3  '$ PHQ - 2 Score '4 3 5  '$ Altered sleeping '3 1 3  '$ Tired, decreased energy '3 3 3  '$ Change in appetite '3 2 1  '$ Feeling bad or failure about yourself  3 1 0  Trouble concentrating '3 3 3  '$ Moving slowly or fidgety/restless '3 3 2  '$ Suicidal thoughts 0 0 0  PHQ-9 Score '22 16 17  '$ Difficult doing work/chores Very difficult Somewhat difficult Somewhat difficult       09/03/2020   10:49 AM  GAD 7 : Generalized Anxiety Score  Nervous, Anxious, on Edge 2  Control/stop worrying 2  Worry too much - different things 2  Trouble relaxing 1  Restless 1  Easily annoyed or irritable 1  Afraid - awful might happen 1  Total GAD 7 Score 10  Anxiety Difficulty Somewhat difficult  -Educated on Benefits of medication for symptom control -Counseled on diet and exercise extensively September 2022: Will ask PCP about increasing therapy March 2023: Patient's son passed November 2022, is unable to sleep at night because mind is racing. PHQ9 has increased. Will try to coordinate with PCP to increase therapy May 2023: Patient visited ER for dizziness, has been going on a few weeks. Most likely Aripiprazole, will let PCP know  Insomnia (Goal: 7-8 hours of sleep/night) -Uncontrolled -Patient currently getting 3 hours of sleep/night -Patient currently waking up 0 times/night -Current treatment: Trazodone '100mg'$  HS Appropriate, Query effective, Safe, Accessible -Medications previously tried: N/A -Counseled on sleep hygiene techniques (consistent sleep/wake up schedule, no electronic screens 1-2 hours before bed, etc) March 2023: Patient getting 3 hours of sleep/night. Goes to bed at 2000 and watches TV for a few hours until 2230 in bed. Is unable to sleep until 0300. Counsled on sleep hygiene (Will watch TV in living room and colour for  30 mins before bed). Patient states she can't sleep because her mind is racing (Son passed November 2022), will coordinate with PCP to increase mental health therapy   GERD (Goal: manage symptoms of GERD) -Not ideally controlled -Current treatment  Omeprazole 40 mg daily Appropriate, Effective, Safe, Accessible Promethazine 25 mg every 6 hours prn nausea/vomiting Appropriate, Effective, Safe, Accessible -Medications previously tried:  Linzess, metoclopramide,  -Counseled on diet and exercise extensively Recommended to continue current  medication September 2022: Just had Hiatal Hernia Sx, defer to specialist  Tobacco use (Goal reduce cigarette use) -Uncontrolled -Previous quit attempts: none reported -Current treatment  Patient take bupropion for depression and is not ready to quit at this time  -Patient smokes Within 30 minutes of waking -Patient triggers include: stress, anxiety and sadness and craving the taste of a cigarette and needing to do something with your hands/mouth -On a scale of 1-10, reports MOTIVATION to quit is 0 -On a scale of 1-10, reports CONFIDENCE in quitting is 0 -Provided contact information for [place] [facility] (1-800-QUIT-NOW) and encouraged patient to reach out to this group for support. -Educated on options when patient is ready. Patient hopes to maybe begin with reducing from 1.5 packs per day. She is not ready to begin cessation at this time and will contact office when she is ready.  May 2023: Patient not ready to quit, would like to try smoking 1/2 PPD (Is at 1PPD now)  Osteoporosis / Osteopenia (Goal minimize risk of breaks or fractures) Last vitamin D Lab Results  Component Value Date   VD25OH 17.3 (L) 12/10/2019  -Last DEXA Scan:  has had one a long time ago. Needs updated scan completed within [place] to allow for transportation. .   -Current treatment  Diet  -Medications previously tried:  vitamin d -Recommend [phone] units of vitamin D daily. Recommend 1200 mg of calcium daily from dietary and supplemental sources. Recommend weight-bearing and muscle strengthening exercises for building and maintaining bone density. -Counseled on diet and exercise extensively March 2023: Recommended updated Dexa scan and Vitamin D level drawn with next labs.  May 2023: Same recommendations   Patient Goals/Self-Care Activities Patient will:  - take medications as prescribed focus on medication adherence by using packaing and delivery through [employer].  check blood pressure weekly,  document, and provide at future appointments target a minimum of 150 minutes of moderate intensity exercise weekly engage in dietary modifications by heart-healthy, low-sodium diet.   Follow Up Plan: Telephone follow up appointment with care management team member scheduled for: August 2023  [place] Constable, [place].D. - [phone]      Ms. Mori was given information about Chronic Care Management services today including:  CCM service includes personalized support from designated clinical staff supervised by her physician, including individualized plan of care and coordination with other care providers 24/7 contact phone numbers for assistance for urgent and routine care needs. Standard insurance, coinsurance, copays and deductibles apply for chronic care management only during months in which we provide at least 20 minutes of these services. Most insurances cover these services at 100%, however patients may be responsible for any copay, coinsurance and/or deductible if applicable. This service may help you avoid the need for more expensive face-to-face services. Only one practitioner may furnish and bill the service in a calendar month. The patient may stop CCM services at any time (effective at the end of the month) by phone call to the office staff.  Patient agreed to services and verbal consent  obtained.   The patient verbalized understanding of instructions, educational materials, and care plan provided today and DECLINED offer to receive copy of patient instructions, educational materials, and care plan.  The pharmacy team will reach out to the patient again over the next 60 days.   Lane Hacker, [place]

## 2021-10-26 ENCOUNTER — Ambulatory Visit: Payer: Medicare Other | Admitting: Legal Medicine

## 2021-10-26 DIAGNOSIS — J449 Chronic obstructive pulmonary disease, unspecified: Secondary | ICD-10-CM | POA: Diagnosis not present

## 2021-10-26 DIAGNOSIS — I10 Essential (primary) hypertension: Secondary | ICD-10-CM | POA: Diagnosis not present

## 2021-10-26 DIAGNOSIS — F17210 Nicotine dependence, cigarettes, uncomplicated: Secondary | ICD-10-CM | POA: Diagnosis not present

## 2021-10-26 DIAGNOSIS — F32A Depression, unspecified: Secondary | ICD-10-CM | POA: Diagnosis not present

## 2021-10-26 DIAGNOSIS — I2510 Atherosclerotic heart disease of native coronary artery without angina pectoris: Secondary | ICD-10-CM | POA: Diagnosis not present

## 2021-10-26 DIAGNOSIS — M8580 Other specified disorders of bone density and structure, unspecified site: Secondary | ICD-10-CM

## 2021-11-03 ENCOUNTER — Encounter: Payer: Self-pay | Admitting: Nurse Practitioner

## 2021-11-03 ENCOUNTER — Ambulatory Visit (INDEPENDENT_AMBULATORY_CARE_PROVIDER_SITE_OTHER): Payer: Medicare Other | Admitting: Nurse Practitioner

## 2021-11-03 VITALS — BP 134/80 | HR 67 | Temp 97.6°F | Ht 62.0 in | Wt 139.0 lb

## 2021-11-03 DIAGNOSIS — R42 Dizziness and giddiness: Secondary | ICD-10-CM | POA: Diagnosis not present

## 2021-11-03 DIAGNOSIS — F339 Major depressive disorder, recurrent, unspecified: Secondary | ICD-10-CM | POA: Diagnosis not present

## 2021-11-03 DIAGNOSIS — E876 Hypokalemia: Secondary | ICD-10-CM

## 2021-11-03 DIAGNOSIS — Z8349 Family history of other endocrine, nutritional and metabolic diseases: Secondary | ICD-10-CM

## 2021-11-03 DIAGNOSIS — E871 Hypo-osmolality and hyponatremia: Secondary | ICD-10-CM

## 2021-11-03 MED ORDER — MECLIZINE HCL 25 MG PO TABS: 25.0000 mg | ORAL_TABLET | Freq: Three times a day (TID) | ORAL | 1 refills | Status: DC | PRN

## 2021-11-03 MED ORDER — BUPROPION HCL ER (XL) 150 MG PO TB24: ORAL_TABLET | ORAL | 2 refills | Status: DC

## 2021-11-03 NOTE — Progress Notes (Signed)
Subjective:  Patient ID: [name] [name], female    DOB: 11-27-1945  Age: 76 y.o. MRN: [id number]  CC: Hospital follow-up  HPI   Pt presents for follow-up of vertigo. She has asked for TSH level drawn today. States her sister has thyroid disease. Pt also requested Wellbutrin 150 mg XL be resumed. She is not currently in counseling or taking antidepressants.   Follow up ER visit  Patient was seen in ER for MCHC on 10/15/21. She was treated for Vertigo. Treatment for this included Antivert. She reports good compliance with treatment. She reports this condition is Improved.  Has ENT appt 12/19/2021  Depression     11/03/2021   11:29 AM 08/11/2021   11:14 AM 05/05/2021    2:40 PM  Depression screen PHQ 2/9  Decreased Interest '1 1 1  '$ Down, Depressed, Hopeless '1 3 2  '$ PHQ - 2 Score '2 4 3  '$ Altered sleeping '3 3 1  '$ Tired, decreased energy '3 3 3  '$ Change in appetite '3 3 2  '$ Feeling bad or failure about yourself  0 3 1  Trouble concentrating '3 3 3  '$ Moving slowly or fidgety/restless '1 3 3  '$ Suicidal thoughts 0 0 0  PHQ-9 Score '15 22 16  '$ Difficult doing work/chores Not difficult at all Very difficult Somewhat difficult        Current Outpatient Medications on File Prior to Visit  Medication Sig Dispense Refill   albuterol (PROVENTIL) (2.5 MG/3ML) 0.083% nebulizer solution Take 3 mLs (2.5 mg total) by nebulization every 6 (six) hours as needed for wheezing or shortness of breath. 120 mL 6   albuterol (VENTOLIN HFA) 108 (90 Base) MCG/ACT inhaler INHALE TWO PUFFS BY MOUTH INTO LUNGS EVERY 6 HOURS AS NEEDED FOR SHORTNESS OF BREATH OR wheezing 8.5 g 6   aspirin EC 81 MG tablet Take 1 tablet (81 mg total) by mouth daily. Swallow whole. 90 tablet 3   Budeson-Glycopyrrol-Formoterol 160-9-4.8 MCG/ACT AERO Inhale 2 puffs into the lungs 2 (two) times daily as needed (shortness of breath). 10.7 g 2   buPROPion (WELLBUTRIN XL) 150 MG 24 hr tablet TAKE 1 TABLET(150 MG) BY MOUTH DAILY 90 tablet 2    cloNIDine (CATAPRES) 0.1 MG tablet Take 1 tablet (0.1 mg total) by mouth 2 (two) times daily. 180 tablet 3   diazepam (VALIUM) 5 MG tablet TAKE 1 TABLET BY MOUTH EVERY 12 HOURS AS NEEDED FOR ANXIETY. WATCH FOR SEDATION 60 tablet 3   isosorbide mononitrate (IMDUR) 30 MG 24 hr tablet Take 0.5 tablets (15 mg total) by mouth daily. 45 tablet 3   lisinopril-hydrochlorothiazide (ZESTORETIC) 20-12.5 MG tablet Take 2 tablets by mouth every morning. 180 tablet 1   meclizine (ANTIVERT) 25 MG tablet Take 1 tablet (25 mg total) by mouth 3 (three) times daily as needed for dizziness. 30 tablet 0   nitroGLYCERIN (NITROSTAT) 0.4 MG SL tablet Place 1 tablet (0.4 mg total) under the tongue every 5 (five) minutes as needed for chest pain. 25 tablet 3   omeprazole (PRILOSEC) 40 MG capsule Take 1 capsule (40 mg total) by mouth daily. 90 capsule 1   rosuvastatin (CRESTOR) 10 MG tablet Take 1 tablet (10 mg total) by mouth daily. 90 tablet 1   traZODone (DESYREL) 100 MG tablet Take 1 tablet (100 mg total) by mouth at bedtime. 90 tablet 1   No current facility-administered medications on file prior to visit.   Past Medical History:  Diagnosis Date   Anxiety    Aortic  atherosclerosis ([place])    Ascending aorta dilation ([place])    a.) TTE 09/02/19 --> mild; measured 48m.   Asthma    CAD (coronary artery disease)    a,) CTA 08/24/20 --> mild CAD (25-49%) in RCA, LAD, LCx; CAC/Agatston score 509.   Cataract    Chronic left shoulder pain 09/27/2015   COPD (chronic obstructive pulmonary disease) (HCC)    Depression    Diastolic dysfunction    a.) TTE 09/02/19 --> LVEF 60-65%; G2DD. b.) TTE 07/23/20 --> LVEF 70-75%; G1DD.   GERD (gastroesophageal reflux disease)    History of kidney stones    Hyperlipidemia    Hypertension    Lupus ([facility])    Myocardial infarction ([facility]    Osteoarthritis    PAC (premature atrial contraction)    a.) Holter 12/04/19 --> occassional; 1.1% PAC burden.   Pain of right hip joint  11/22/2015   Paraesophageal hernia    Presbyesophagus    Primary osteoarthritis of left knee 02/04/2015   PSVT (paroxysmal supraventricular tachycardia) ([facility]    a.) Holter 12/04/19 --> 22 runs.   Schatzki's ring    SOB (shortness of breath)    T2DM (type 2 diabetes mellitus) ([facility] [place]    Past Surgical History:  Procedure Laterality Date   ABDOMINAL HYSTERECTOMY     BLADDER SUSPENSION     BREAST BIOPSY Right    COLONOSCOPY  09/04/2011   Colonic polyps, status post polyectomy. Incidental small ascending colon lipoma   ESOPHAGOGASTRODUODENOSCOPY  09/08/2015   Schatzki ring status post esophageal dilitation. Small hiatal hernia   hemorrhoid surgery     KNEE SURGERY     left   TOTAL KNEE ARTHROPLASTY Left 02/15/2015   Procedure: TOTAL KNEE ARTHROPLASTY;  Surgeon: SVickey Huger MD;  [facility]  Service: Orthopedics;  Laterality: Left;   TUBAL LIGATION     WRIST SURGERY     right   XI ROBOTIC ASSISTED PARAESOPHAGEAL HERNIA REPAIR N/A 02/22/2021   Procedure: XI ROBOTIC ASSISTED PARAESOPHAGEAL HERNIA REPAIR with RNFA to assist;  Surgeon: PJules Husbands MD;  Location: [facility];  Service: General;  Laterality: N/A;    Family History  Problem Relation Age of Onset   Stroke Mother    Heart disease Mother    COPD Father    Cancer Father        Bone   Diabetes Father    Heart disease Father    Stroke Father    Lupus Sister    Seizures Son    COPD Son    Colon cancer Neg Hx    Esophageal cancer Neg Hx    Stomach cancer Neg Hx    Rectal cancer Neg Hx    Social History   Socioeconomic History   Marital status: Widowed    Spouse name: Not on file   Number of children: 4   Years of education: Not on file   Highest education level: Not on file  Occupational History   Occupation: Retired  Tobacco Use   Smoking status: Every Day    Packs/day: 1.00    Years: 47.00    Total pack years: 47.00    Types: Cigarettes   Smokeless tobacco: Never   Tobacco comments:    Smokes 1 pack  daily 01/04/21 [facility]   Vaping Use   Vaping Use: Never used  Substance and Sexual Activity   Alcohol use: No   Drug use: No   Sexual activity: Not Currently  Other Topics Concern  Not on file  Social History Narrative   Not on file   Social Determinants of Health   Financial Resource Strain: Low Risk  (10/20/2021)   Overall Financial Resource Strain (CARDIA)    Difficulty of Paying Living Expenses: Not hard at all  Food Insecurity: Not on file  Transportation Needs: Unmet Transportation Needs (03/01/2021)   PRAPARE - Transportation    Lack of Transportation (Medical): Yes    Lack of Transportation (Non-Medical): Yes  Physical Activity: Insufficiently Active (10/12/2020)   Exercise Vital Sign    Days of Exercise per Week: 2 days    Minutes of Exercise per Session: 20 min  Stress: Stress Concern Present (10/12/2020)   [place]    Feeling of Stress : To some extent  Social Connections: Socially Isolated (10/12/2020)   Social Connection and Isolation Panel [NHANES]    Frequency of Communication with Friends and Family: Three times a week    Frequency of Social Gatherings with Friends and Family: Never    Attends Religious Services: Never    Marine scientist or Organizations: No    Attends Archivist Meetings: Never    Marital Status: Widowed    Review of Systems  Constitutional:  Negative for chills, fatigue and fever.  Respiratory:  Negative for cough and shortness of breath.   Cardiovascular:  Negative for chest pain.  Gastrointestinal:  Negative for nausea and vomiting.  Neurological:  Positive for dizziness. Negative for headaches.     Objective:  BP 134/80   Pulse 67   Temp 97.6 F (36.4 C)   Ht '5\' 2"'$  (1.575 m)   Wt 139 lb (63 kg)   SpO2 98%   BMI 25.42 kg/m       10/15/2021    2:15 PM 10/15/2021    1:45 PM 10/15/2021   11:45 AM  BP/Weight  Systolic BP 785 885 027  Diastolic BP  54 60 61    Physical Exam Vitals reviewed.  Constitutional:      Appearance: Normal appearance.  Cardiovascular:     Rate and Rhythm: Normal rate. Rhythm irregular.     Pulses: Normal pulses.     Heart sounds: Normal heart sounds.  Pulmonary:     Effort: Pulmonary effort is normal.     Comments: Diminished breath sounds in all fields Abdominal:     General: Bowel sounds are normal.     Palpations: Abdomen is soft.  Skin:    General: Skin is warm and dry.     Capillary Refill: Capillary refill takes less than 2 seconds.  Neurological:     General: No focal deficit present.     Mental Status: She is alert and oriented to person, place, and time.     Lab Results  Component Value Date   WBC 4.7 10/15/2021   HGB 12.8 10/15/2021   HCT 37.7 10/15/2021   PLT 174 10/15/2021   GLUCOSE 116 (H) 10/15/2021   CHOL 187 10/12/2021   TRIG 59 10/12/2021   HDL 73 10/12/2021   LDLCALC 103 (H) 10/12/2021   ALT 12 10/15/2021   AST 18 10/15/2021   NA 132 (L) 10/15/2021   K 3.4 (L) 10/15/2021   CL 97 (L) 10/15/2021   CREATININE 0.73 10/15/2021   BUN 10 10/15/2021   CO2 29 10/15/2021   INR 1.02 01/28/2015   HGBA1C 5.3 10/12/2021      Assessment & Plan:  1. Vertigo-improved - meclizine (  ANTIVERT) 25 MG tablet; Take 1 tablet (25 mg total) by mouth 3 (three) times daily as needed for dizziness.  Dispense: 180 tablet; Refill: 1 -keep follow-up appt with ENT as scheduled -low sodium diet -Epley maneuvers as needed  2. Depression, recurrent (HCC) - buPROPion (WELLBUTRIN XL) 150 MG 24 hr tablet; TAKE 1 TABLET(150 MG) BY MOUTH DAILY  Dispense: 90 tablet; Refill: 2  3. Family history of thyroid disease - TSH  4. Hypokalemia - Comprehensive metabolic panel  5. Hyponatremia - Comprehensive metabolic panel    Continue Meclizine 25 mg as directed Follow-up with ENT, Dr Gaylyn Cheers as scheduled Low sodium diet We will call you with lab results Resume Wellbutrin 150 xl daily Follow-up as  scheduled     Follow-up: 02/14/22 at 2:20 with Dr Henrene Pastor  An After Visit Summary was printed and given to the patient.  I, Rip Harbour, NP, have reviewed all documentation for this visit. The documentation on 11/03/21 for the exam, diagnosis, procedures, and orders are all accurate and complete.    Signed, Rip Harbour, NP [place] [phone]

## 2021-11-03 NOTE — Patient Instructions (Addendum)
Continue Meclizine 25 mg as directed Follow-up with ENT, Dr Gaylyn Cheers as scheduled Low sodium diet We will call you with lab results Resume Wellbutrin 150 xl daily Follow-up as scheduled   How to Perform the Epley Maneuver The Epley maneuver is an exercise that relieves symptoms of vertigo. Vertigo is the feeling that you or your surroundings are moving when they are not. When you feel vertigo, you may feel like the room is spinning and may have trouble walking. The Epley maneuver is used for a type of vertigo caused by a calcium deposit in a part of the inner ear. The maneuver involves changing head positions to help the deposit move out of the area. You can do this maneuver at home whenever you have symptoms of vertigo. You can repeat it in 24 hours if your vertigo has not gone away. Even though the Epley maneuver may relieve your vertigo for a few weeks, it is possible that your symptoms will return. This maneuver relieves vertigo, but it does not relieve dizziness. What are the risks? If it is done correctly, the Epley maneuver is considered safe. Sometimes it can lead to dizziness or nausea that goes away after a short time. If you develop other symptoms--such as changes in vision, weakness, or numbness--stop doing the maneuver and call your health care provider. Supplies needed: A bed or table. A pillow. How to do the Epley maneuver     Sit on the edge of a bed or table with your back straight and your legs extended or hanging over the edge of the bed or table. Turn your head halfway toward the affected ear or side as told by your health care provider. Lie backward quickly with your head turned until you are lying flat on your back. Your head should dangle (head-hanging position). You may want to position a pillow under your shoulders. Hold this position for at least 30 seconds. If you feel dizzy or have symptoms of vertigo, continue to hold the position until the symptoms stop. Turn your  head to the opposite direction until your unaffected ear is facing down. Your head should continue to dangle. Hold this position for at least 30 seconds. If you feel dizzy or have symptoms of vertigo, continue to hold the position until the symptoms stop. Turn your whole body to the same side as your head so that you are positioned on your side. Your head will now be nearly facedown and no longer needs to dangle. Hold for at least 30 seconds. If you feel dizzy or have symptoms of vertigo, continue to hold the position until the symptoms stop. Sit back up. You can repeat the maneuver in 24 hours if your vertigo does not go away. Follow these instructions at home: For 24 hours after doing the Epley maneuver: Keep your head in an upright position. When lying down to sleep or rest, keep your head raised (elevated) with two or more pillows. Avoid excessive neck movements. Activity Do not drive or use machinery if you feel dizzy. After doing the Epley maneuver, return to your normal activities as told by your health care provider. Ask your health care provider what activities are safe for you. General instructions Drink enough fluid to keep your urine pale yellow. Do not drink alcohol. Take over-the-counter and prescription medicines only as told by your health care provider. Keep all follow-up visits. This is important. Preventing vertigo symptoms Ask your health care provider if there is anything you should do at home  to prevent vertigo. He or she may recommend that you: Keep your head elevated with two or more pillows while you sleep. Do not sleep on the side of your affected ear. Get up slowly from bed. Avoid sudden movements during the day. Avoid extreme head positions or movement, such as looking up or bending over. Contact a health care provider if: Your vertigo gets worse. You have other symptoms, including: Nausea. Vomiting. Headache. Get help right away if you: Have vision  changes. Have a headache or neck pain that is severe or getting worse. Cannot stop vomiting. Have new numbness or weakness in any part of your body. These symptoms may represent a serious problem that is an emergency. Do not wait to see if the symptoms will go away. Get medical help right away. Call your local emergency services (911 in the [place] Do not drive yourself to the hospital. Summary Vertigo is the feeling that you or your surroundings are moving when they are not. The Epley maneuver is an exercise that relieves symptoms of vertigo. If the Epley maneuver is done correctly, it is considered safe. This information is not intended to replace advice given to you by your health care provider. Make sure you discuss any questions you have with your health care provider. Document Revised: 04/14/2020 Document Reviewed: 04/14/2020 Elsevier Patient Education  [place] Disease  Mnire's disease is an inner ear disorder. It causes recurrent attacks of a spinning sensation (vertigo), and ringing in the ear (tinnitus). It also causes hearing loss and a feeling of fullness or pressure in the ear. It typically affects one ear but can affect both. This is a lifelong condition, and it may get worse over time. There are treatment options to help manage the symptoms of Mnire's disease. What are the causes? This condition is caused by having too much of the fluid that is in the inner ear (endolymph). When fluid builds up in the inner ear, it affects the nerves that control balance and hearing. The reason for the fluid buildup is not known. Possible causes include: Allergies. An abnormal reaction of the body's defense system (autoimmune disease). Viral infection of the inner ear. Head injury. What increases the risk? The following factors may make you more likely to develop this condition: Being between 77 and 65 years old. Having a family history of Mnire's disease. Having a  history of autoimmune disease. Having an injury to the head (head trauma). What are the signs or symptoms? Symptoms of this condition include: Fullness and pressure in your ear. Roaring or ringing in your ear (tinnitus). Vertigo and loss of balance. Decreased hearing. Nausea and vomiting. This is rare. Symptoms of this condition can come and go and may last for up to 4 or more hours at a time. Symptoms usually start in one ear. They may become more frequent and eventually involve both ears. How is this diagnosed? This condition is diagnosed based on a physical exam and tests. Tests may include: A hearing test (audiogram). An electronystagmogram (ENG). This tests your balance nerve (vestibular nerve). Imaging studies of your inner ear and hearing nerve, such as an MRI. Other balance tests, such as rotational or balance platform tests. How is this treated? There is no cure for this condition, but treatment can help to manage your symptoms. Treatment may include: A low-salt (low-sodium) diet. This may help to reduce fluid in the body and relieve symptoms. Oral or injected medicines to reduce or control: Vertigo. Nausea. Fluid  retention. Use of an air pressure pulse generator. This is a machine that sends small pressure pulses into your ear canal. Injections through the ear drum (tympanic membrane) to control vertigo symptoms. Hearing aids. Inner ear surgery. This is rare. Follow these instructions at home: Eating and drinking Avoid caffeine. Do not drink alcohol. Drink enough fluid to keep your urine pale yellow. Limit the sodium in your diet as told by your health care provider. This is usually no more than 1,500-2,000 mg per day. Check ingredients and nutrition facts on packaged foods and beverages. General instructions Take over-the-counter and prescription medicines only as told by your health care provider. Do not drive if you have vertigo or dizziness. Do not use any products  that contain nicotine or tobacco. These products include cigarettes, chewing tobacco, and vaping devices, such as e-cigarettes. If you need help quitting, ask your health care provider. Keep all follow-up visits. This is important. Where to find more information To find more information, advice, and guidance, please see these online sites: American Academy of Otolaryngology-Head and Neck Surgery Foundation: www.enthealth.[place] on Deafness and Other Communication Disorders: [employer].se [employer]: www.american-hearing.org Contact a health care provider if: You have symptoms that last longer than 4 hours. You have new or worse symptoms. Get help right away if: You have been vomiting for 24 hours. You cannot keep fluids down. You have chest pain or trouble breathing. These symptoms may represent a serious problem that is an emergency. Do not wait to see if the symptoms will go away. Get medical help right away. Call your local emergency services (911 in the [place] Do not drive yourself to the hospital. Summary Mnire's disease is an inner ear disorder. This condition causes recurrent attacks of a spinning sensation (vertigo), ringing in the ear (tinnitus), hearing loss, and ear fullness. Symptoms of this condition can come and go and may last for up to 4 or more hours at a time. Mnire's disease can be treated with a low-sodium diet, medicines, and sometimes, surgery. This information is not intended to replace advice given to you by your health care provider. Make sure you discuss any questions you have with your health care provider. Document Revised: 04/19/2020 Document Reviewed: 04/19/2020 Elsevier Patient Education  [place].      Vertigo Vertigo is the feeling that you or the things around you are moving when they are not. This feeling can come and go at any time. Vertigo often goes away on its own. This condition can be  dangerous if it happens when you are doing activities like driving or working with machines. Your doctor will do tests to find the cause of your vertigo. These tests will also help your doctor decide on the best treatment for you. Follow these instructions at home: Eating and drinking     Drink enough fluid to keep your pee (urine) pale yellow. Do not drink alcohol. Activity Return to your normal activities when your doctor says that it is safe. In the morning, first sit up on the side of the bed. When you feel okay, stand slowly while you hold onto something until you know that your balance is fine. Move slowly. Avoid sudden body or head movements or certain positions, as told by your doctor. Use a cane if you have trouble standing or walking. Sit down right away if you feel dizzy. Avoid doing any tasks or activities that can cause danger to you or others if you get dizzy.  Avoid bending down if you feel dizzy. Place items in your home so that they are easy for you to reach without bending or leaning over. Do not drive or use machinery if you feel dizzy. General instructions Take over-the-counter and prescription medicines only as told by your doctor. Keep all follow-up visits. Contact a doctor if: Your medicine does not help your vertigo. Your problems get worse or you have new symptoms. You have a fever. You feel like you may vomit (nauseous), or this feeling gets worse. You start to vomit. Your family or friends see changes in how you act. You lose feeling (have numbness) in part of your body. You feel prickling and tingling in a part of your body. Get help right away if: You are always dizzy. You faint. You get very bad headaches. You get a stiff neck. Bright light starts to bother you. You have trouble moving or talking. You feel weak in your hands, arms, or legs. You have changes in your hearing or in how you see (vision). These symptoms may be an emergency. Get help  right away. Call your local emergency services (911 in the [place] Do not wait to see if the symptoms will go away. Do not drive yourself to the hospital. Summary Vertigo is the feeling that you or the things around you are moving when they are not. Your doctor will do tests to find the cause of your vertigo. You may be told to avoid some tasks, positions, or movements. Contact a doctor if your medicine is not helping, or if you have a fever, new symptoms, or a change in how you act. Get help right away if you get very bad headaches, or if you have changes in how you speak, hear, or see. This information is not intended to replace advice given to you by your health care provider. Make sure you discuss any questions you have with your health care provider. Document Revised: 04/14/2020 Document Reviewed: 04/14/2020 Elsevier Patient Education  [place].

## 2021-11-04 LAB — COMPREHENSIVE METABOLIC PANEL
ALT: 10 IU/L (ref 0–32)
AST: 17 IU/L (ref 0–40)
Albumin/Globulin Ratio: 1.9 (ref 1.2–2.2)
Albumin: 4.4 g/dL (ref 3.7–4.7)
Alkaline Phosphatase: 74 IU/L (ref 44–121)
BUN/Creatinine Ratio: 13 (ref 12–28)
BUN: 9 mg/dL (ref 8–27)
Bilirubin Total: 0.5 mg/dL (ref 0.0–1.2)
CO2: 26 mmol/L (ref 20–29)
Calcium: 9.6 mg/dL (ref 8.7–10.3)
Chloride: 97 mmol/L (ref 96–106)
Creatinine, Ser: 0.71 mg/dL (ref 0.57–1.00)
Globulin, Total: 2.3 g/dL (ref 1.5–4.5)
Glucose: 77 mg/dL (ref 70–99)
Potassium: 4.5 mmol/L (ref 3.5–5.2)
Sodium: 136 mmol/L (ref 134–144)
Total Protein: 6.7 g/dL (ref 6.0–8.5)
eGFR: 89 mL/min/{1.73_m2} (ref >59)

## 2021-11-04 LAB — TSH: TSH: 1.53 u[IU]/mL (ref 0.450–4.500)

## 2021-11-10 ENCOUNTER — Encounter: Payer: Self-pay | Admitting: Legal Medicine

## 2021-11-10 ENCOUNTER — Telehealth: Payer: Self-pay

## 2021-11-10 ENCOUNTER — Other Ambulatory Visit: Payer: Self-pay | Admitting: Family Medicine

## 2021-11-10 MED ORDER — IBUPROFEN 800 MG PO TABS: 800.0000 mg | ORAL_TABLET | Freq: Three times a day (TID) | ORAL | 0 refills | Status: DC | PRN

## 2021-11-10 NOTE — Telephone Encounter (Signed)
Patient requesting ibuprofen 800 mg as needed for chronic back pain. States Dr Henrene Pastor knows her pain.   Harrell Lark 11/10/21 2:58 PM

## 2021-11-17 ENCOUNTER — Telehealth: Payer: Self-pay

## 2021-11-17 NOTE — Progress Notes (Unsigned)
Chronic Care Management Pharmacy Assistant   Name: [name] [name]  MRN: [id number] DOB: [birth year]   Reason for Encounter: Medication Coordination for [facility]    Recent office visits:  11/10/21 Rochel Brome MD. Orders Only. Ordered Advil '800mg'$ .   11/03/21 Jerrell Belfast NP. Seen for Vertigo. No med changes.  10/20/21 [name] MD. Orders Only. D/C Abilify '2mg'$ .   Recent consult visits:  None  Hospital visits:  None  Medications: Outpatient Encounter Medications as of 11/17/2021  Medication Sig   albuterol (PROVENTIL) (2.5 MG/3ML) 0.083% nebulizer solution Take 3 mLs (2.5 mg total) by nebulization every 6 (six) hours as needed for wheezing or shortness of breath.   albuterol (VENTOLIN HFA) 108 (90 Base) MCG/ACT inhaler INHALE TWO PUFFS BY MOUTH INTO LUNGS EVERY 6 HOURS AS NEEDED FOR SHORTNESS OF BREATH OR wheezing   aspirin EC 81 MG tablet Take 1 tablet (81 mg total) by mouth daily. Swallow whole.   Budeson-Glycopyrrol-Formoterol 160-9-4.8 MCG/ACT AERO Inhale 2 puffs into the lungs 2 (two) times daily as needed (shortness of breath).   buPROPion (WELLBUTRIN XL) 150 MG 24 hr tablet TAKE 1 TABLET(150 MG) BY MOUTH DAILY   cloNIDine (CATAPRES) 0.1 MG tablet Take 1 tablet (0.1 mg total) by mouth 2 (two) times daily.   diazepam (VALIUM) 5 MG tablet TAKE 1 TABLET BY MOUTH EVERY 12 HOURS AS NEEDED FOR ANXIETY. WATCH FOR SEDATION   ibuprofen (ADVIL) 800 MG tablet Take 1 tablet (800 mg total) by mouth every 8 (eight) hours as needed for moderate pain.   isosorbide mononitrate (IMDUR) 30 MG 24 hr tablet Take 0.5 tablets (15 mg total) by mouth daily.   lisinopril-hydrochlorothiazide (ZESTORETIC) 20-12.5 MG tablet Take 2 tablets by mouth every morning.   meclizine (ANTIVERT) 25 MG tablet Take 1 tablet (25 mg total) by mouth 3 (three) times daily as needed for dizziness.   nitroGLYCERIN (NITROSTAT) 0.4 MG SL tablet Place 1 tablet (0.4 mg total) under the tongue every 5 (five) minutes as  needed for chest pain.   omeprazole (PRILOSEC) 40 MG capsule Take 1 capsule (40 mg total) by mouth daily.   rosuvastatin (CRESTOR) 10 MG tablet Take 1 tablet (10 mg total) by mouth daily.   traZODone (DESYREL) 100 MG tablet Take 1 tablet (100 mg total) by mouth at bedtime.   No facility-administered encounter medications on file as of 11/17/2021.    Reviewed chart for medication changes ahead of medication coordination call.  No Consults, or hospital visits since last care coordination call/Pharmacist visit.    BP Readings from Last 3 Encounters:  11/03/21 134/80  10/15/21 (!) 109/54  10/12/21 124/60    Lab Results  Component Value Date   HGBA1C 5.3 10/12/2021     Patient obtains medications through Adherence Packaging  30 Days   Last adherence delivery included:  Aripiprazole '2mg'$  1 at breakfast Isosorbide Monoitrate 30 mg 0.5 tablet at Breakfast  Rosuvastatin 10 mg 1 at bedtime Clonidine 0.1 mg 1 at breakfast and 1 at Bedtime Bupropion XL 150 mg 1 at breakfast Trazodone 100 mg 1 at bedtime prn (Vials) Lisinopril-HCTZ 20-12.5 mg 2 at breakfast Omeprazole 40 mg 1 at breakfast  Alubterol Inhaler 39mg 2 puffs every 6hrs prn  Nitroglycerin 0.'4mg'$  1 tab every 5 mins as needed    Patient declined (meds) last month  Meclizine '25mg'$ - Was on a temporary supply  Asprin '81mg'$ - Taking OTC  Diazepam '5mg'$ - Not taking taking  Breztri Inhaler- has enough on hand   Patient  is due for next adherence delivery on: 11/29/21. Called patient and reviewed medications and coordinated delivery.  This delivery to include: Isosorbide Monoitrate 30 mg 0.5 tablet at Breakfast  Rosuvastatin 10 mg 1 at bedtime Clonidine 0.1 mg 1 at breakfast and 1 at Bedtime Bupropion XL 150 mg 1 at breakfast Trazodone 100 mg 1 at bedtime prn (Vials) Lisinopril-HCTZ 20-12.5 mg 2 at breakfast Omeprazole 40 mg 1 at breakfast  Alubterol Inhaler 71mg 2 puffs every 6hrs prn  Nitroglycerin 0.'4mg'$  1 tab every 5 mins as  needed    Patient declined the following medications  meclizine not due until 12/07/21 Asprin '81mg'$ - Taking OTC  Diazepam '5mg'$ - Not taking taking  Breztri Inhaler- has enough on hand   Patient needs refills None  Confirmed delivery date of 11/29/21, advised patient that pharmacy will contact them the morning of delivery.   DElray Mcgregor CLake BryanPharmacist Assistant  [phone]

## 2021-12-14 ENCOUNTER — Other Ambulatory Visit: Payer: Self-pay

## 2021-12-14 ENCOUNTER — Encounter: Payer: Self-pay | Admitting: Legal Medicine

## 2021-12-14 ENCOUNTER — Ambulatory Visit (INDEPENDENT_AMBULATORY_CARE_PROVIDER_SITE_OTHER): Payer: Medicare Other | Admitting: Legal Medicine

## 2021-12-14 VITALS — BP 120/70 | HR 72 | Temp 97.9°F | Resp 15 | Ht 62.0 in | Wt 138.0 lb

## 2021-12-14 DIAGNOSIS — R1032 Left lower quadrant pain: Secondary | ICD-10-CM

## 2021-12-14 DIAGNOSIS — N200 Calculus of kidney: Secondary | ICD-10-CM | POA: Diagnosis not present

## 2021-12-14 DIAGNOSIS — R109 Unspecified abdominal pain: Secondary | ICD-10-CM | POA: Diagnosis not present

## 2021-12-14 DIAGNOSIS — I700 Atherosclerosis of aorta: Secondary | ICD-10-CM | POA: Diagnosis not present

## 2021-12-14 DIAGNOSIS — R310 Gross hematuria: Secondary | ICD-10-CM | POA: Diagnosis not present

## 2021-12-14 LAB — POCT URINALYSIS DIP (CLINITEK)
Bilirubin, UA: NEGATIVE
Glucose, UA: NEGATIVE mg/dL
Ketones, POC UA: NEGATIVE mg/dL
Nitrite, UA: NEGATIVE
POC PROTEIN,UA: 30 — AB
Spec Grav, UA: 1.015 (ref 1.010–1.025)
Urobilinogen, UA: 0.2 E.U./dL
pH, UA: 7.5 (ref 5.0–8.0)

## 2021-12-14 NOTE — Progress Notes (Signed)
Acute Office Visit  Subjective:    Patient ID: [name] [name], female    DOB: November 19, 1945, 76 y.o.   MRN: [id number]  Chief Complaint  Patient presents with   Hematuria    HPI: Patient is in today for hematuria since 2 days ago. She noticed some blood clots in the toilet  on Monday and also she mentioned generalized abdominal pain, and lower back pain. Pain in left lower abdomen. She has loss appetite and she feels like she is losing weight. She denied fever, chills, chest pain, dysuria, frequency urination. She had HH repair in [place] and has since lost weight. No dysuria, weight is stable.  Past Medical History:  Diagnosis Date   Anxiety    Aortic atherosclerosis ([place])    Ascending aorta dilation ([place])    a.) TTE 09/02/19 --> mild; measured 43m.   Asthma    CAD (coronary artery disease)    a,) CTA 08/24/20 --> mild CAD (25-49%) in RCA, LAD, LCx; CAC/Agatston score 509.   Cataract    Chronic left shoulder pain 09/27/2015   COPD (chronic obstructive pulmonary disease) (HCC)    Depression    Diastolic dysfunction    a.) TTE 09/02/19 --> LVEF 60-65%; G2DD. b.) TTE 07/23/20 --> LVEF 70-75%; G1DD.   GERD (gastroesophageal reflux disease)    History of kidney stones    Hyperlipidemia    Hypertension    Lupus (HCC)    Myocardial infarction ([place]    Osteoarthritis    PAC (premature atrial contraction)    a.) Holter 12/04/19 --> occassional; 1.1% PAC burden.   Pain of right hip joint 11/22/2015   Paraesophageal hernia    Presbyesophagus    Primary osteoarthritis of left knee 02/04/2015   PSVT (paroxysmal supraventricular tachycardia) ([place]    a.) Holter 12/04/19 --> 22 runs.   Schatzki's ring    SOB (shortness of breath)    T2DM (type 2 diabetes mellitus) ([place]     Past Surgical History:  Procedure Laterality Date   ABDOMINAL HYSTERECTOMY     BLADDER SUSPENSION     BREAST BIOPSY Right    COLONOSCOPY  09/04/2011   Colonic polyps, status post polyectomy. Incidental  small ascending colon lipoma   ESOPHAGOGASTRODUODENOSCOPY  09/08/2015   Schatzki ring status post esophageal dilitation. Small hiatal hernia   hemorrhoid surgery     KNEE SURGERY     left   TOTAL KNEE ARTHROPLASTY Left 02/15/2015   Procedure: TOTAL KNEE ARTHROPLASTY;  Surgeon: SVickey Huger MD;  [facility]  Service: Orthopedics;  Laterality: Left;   TUBAL LIGATION     WRIST SURGERY     right   XI ROBOTIC ASSISTED PARAESOPHAGEAL HERNIA REPAIR N/A 02/22/2021   Procedure: XI ROBOTIC ASSISTED PARAESOPHAGEAL HERNIA REPAIR with RNFA to assist;  Surgeon: PJules Husbands MD;  Location: [facility];  Service: General;  Laterality: N/A;    Family History  Problem Relation Age of Onset   Stroke Mother    Heart disease Mother    COPD Father    Cancer Father        Bone   Diabetes Father    Heart disease Father    Stroke Father    Lupus Sister    Seizures Son    COPD Son    Colon cancer Neg Hx    Esophageal cancer Neg Hx    Stomach cancer Neg Hx    Rectal cancer Neg Hx     Social History   Socioeconomic  History   Marital status: Widowed    Spouse name: Not on file   Number of children: 4   Years of education: Not on file   Highest education level: Not on file  Occupational History   Occupation: Retired  Tobacco Use   Smoking status: Every Day    Packs/day: 1.00    Years: 47.00    Total pack years: 47.00    Types: Cigarettes   Smokeless tobacco: Never   Tobacco comments:    Smokes 1 pack daily 01/04/21 [facility]   Vaping Use   Vaping Use: Never used  Substance and Sexual Activity   Alcohol use: No   Drug use: No   Sexual activity: Not Currently  Other Topics Concern   Not on file  Social History Narrative   Not on file   Social Determinants of Health   Financial Resource Strain: Low Risk  (10/20/2021)   Overall Financial Resource Strain (CARDIA)    Difficulty of Paying Living Expenses: Not hard at all  Food Insecurity: No Food Insecurity (09/03/2020)   Hunger Vital Sign     Worried About Running Out of Food in the Last Year: Never true    [place] in the Last Year: Never true  Transportation Needs: Unmet Transportation Needs (03/01/2021)   PRAPARE - Transportation    Lack of Transportation (Medical): Yes    Lack of Transportation (Non-Medical): Yes  Physical Activity: Insufficiently Active (10/12/2020)   Exercise Vital Sign    Days of Exercise per Week: 2 days    Minutes of Exercise per Session: 20 min  Stress: Stress Concern Present (10/12/2020)   [place]    Feeling of Stress : To some extent  Social Connections: Socially Isolated (10/12/2020)   Social Connection and Isolation Panel [NHANES]    Frequency of Communication with Friends and Family: Three times a week    Frequency of Social Gatherings with Friends and Family: Never    Attends Religious Services: Never    Marine scientist or Organizations: No    Attends Archivist Meetings: Never    Marital Status: Widowed  Intimate Partner Violence: Not At Risk (10/12/2020)   Humiliation, Afraid, Rape, and Kick questionnaire    Fear of Current or Ex-Partner: No    Emotionally Abused: No    Physically Abused: No    Sexually Abused: No    Outpatient Medications Prior to Visit  Medication Sig Dispense Refill   albuterol (PROVENTIL) (2.5 MG/3ML) 0.083% nebulizer solution Take 3 mLs (2.5 mg total) by nebulization every 6 (six) hours as needed for wheezing or shortness of breath. 120 mL 6   albuterol (VENTOLIN HFA) 108 (90 Base) MCG/ACT inhaler INHALE TWO PUFFS BY MOUTH INTO LUNGS EVERY 6 HOURS AS NEEDED FOR SHORTNESS OF BREATH OR wheezing 8.5 g 6   aspirin EC 81 MG tablet Take 1 tablet (81 mg total) by mouth daily. Swallow whole. 90 tablet 3   Budeson-Glycopyrrol-Formoterol 160-9-4.8 MCG/ACT AERO Inhale 2 puffs into the lungs 2 (two) times daily as needed (shortness of breath). 10.7 g 2   buPROPion (WELLBUTRIN XL) 150  MG 24 hr tablet TAKE 1 TABLET(150 MG) BY MOUTH DAILY 90 tablet 2   cloNIDine (CATAPRES) 0.1 MG tablet Take 1 tablet (0.1 mg total) by mouth 2 (two) times daily. 180 tablet 3   diazepam (VALIUM) 5 MG tablet TAKE 1 TABLET BY MOUTH EVERY 12 HOURS AS NEEDED FOR ANXIETY.  WATCH FOR SEDATION 60 tablet 3   ibuprofen (ADVIL) 800 MG tablet Take 1 tablet (800 mg total) by mouth every 8 (eight) hours as needed for moderate pain. 90 tablet 0   isosorbide mononitrate (IMDUR) 30 MG 24 hr tablet Take 0.5 tablets (15 mg total) by mouth daily. 45 tablet 3   lisinopril-hydrochlorothiazide (ZESTORETIC) 20-12.5 MG tablet Take 2 tablets by mouth every morning. 180 tablet 1   meclizine (ANTIVERT) 25 MG tablet Take 1 tablet (25 mg total) by mouth 3 (three) times daily as needed for dizziness. 180 tablet 1   nitroGLYCERIN (NITROSTAT) 0.4 MG SL tablet Place 1 tablet (0.4 mg total) under the tongue every 5 (five) minutes as needed for chest pain. 25 tablet 3   omeprazole (PRILOSEC) 40 MG capsule Take 1 capsule (40 mg total) by mouth daily. 90 capsule 1   rosuvastatin (CRESTOR) 10 MG tablet Take 1 tablet (10 mg total) by mouth daily. 90 tablet 1   traZODone (DESYREL) 100 MG tablet Take 1 tablet (100 mg total) by mouth at bedtime. 90 tablet 1   No facility-administered medications prior to visit.    Allergies  Allergen Reactions   Meperidine Anaphylaxis and Other (See Comments)    Blood pressure dropped, also   Influenza Vaccines Other (See Comments)    Flu-like symptoms   Other Rash and Other (See Comments)    asparagus    Review of Systems  Constitutional:  Negative for chills, fatigue and fever.  HENT:  Negative for congestion, ear pain and sore throat.   Eyes:  Negative for visual disturbance.  Respiratory:  Negative for cough and shortness of breath.   Cardiovascular:  Negative for chest pain and palpitations.  Gastrointestinal:  Positive for abdominal pain. Negative for constipation, diarrhea, nausea and  vomiting.  Endocrine: Negative for polydipsia, polyphagia and polyuria.  Genitourinary:  Negative for difficulty urinating, dysuria and frequency.  Musculoskeletal:  Positive for back pain. Negative for arthralgias and myalgias.  Skin:  Negative for rash.  Neurological:  Negative for headaches.  Psychiatric/Behavioral:  Negative for dysphoric mood. The patient is not nervous/anxious.        Objective:    Physical Exam Vitals reviewed.  Constitutional:      General: She is not in acute distress.    Appearance: Normal appearance.  HENT:     Head: Normocephalic.     Right Ear: Tympanic membrane normal.     Left Ear: Tympanic membrane normal.     Mouth/Throat:     Mouth: Mucous membranes are moist.  Eyes:     Extraocular Movements: Extraocular movements intact.     Pupils: Pupils are equal, round, and reactive to light.  Cardiovascular:     Rate and Rhythm: Normal rate and regular rhythm.     Pulses: Normal pulses.     Heart sounds: Normal heart sounds. No murmur heard.    No gallop.  Pulmonary:     Effort: Pulmonary effort is normal. No respiratory distress.     Breath sounds: Normal breath sounds. No wheezing.  Abdominal:     General: Bowel sounds are normal.     Palpations: Abdomen is soft.     Tenderness: There is abdominal tenderness in the left lower quadrant. There is no right CVA tenderness, left CVA tenderness, guarding or rebound.    Musculoskeletal:     Cervical back: Normal range of motion.  Skin:    Capillary Refill: Capillary refill takes less than 2 seconds.  Neurological:  General: No focal deficit present.     Mental Status: She is alert and oriented to person, place, and time. Mental status is at baseline.  Psychiatric:        Mood and Affect: Mood normal.     BP 120/70   Pulse 72   Temp 97.9 F (36.6 C)   Resp 15   Ht _0  (1.575 m)   Wt 138 lb (62.6 kg)   SpO2 100%   BMI 25.24 kg/m  Wt Readings from Last 3 Encounters:  12/14/21 138 lb  (62.6 kg)  11/03/21 139 lb (63 kg)  10/15/21 139 lb (63 kg)    Health Maintenance Due  Topic Date Due   COVID-19 Vaccine (1) Never done   Pneumonia Vaccine 55+ Years old (1 - PCV) Never done   OPHTHALMOLOGY EXAM  Never done   Hepatitis C Screening  Never done   TETANUS/TDAP  Never done   Zoster Vaccines- Shingrix (1 of 2) Never done    There are no preventive care reminders to display for this patient.   Lab Results  Component Value Date   TSH 1.530 11/03/2021   Lab Results  Component Value Date   WBC 4.7 10/15/2021   HGB 12.8 10/15/2021   HCT 37.7 10/15/2021   MCV 95.0 10/15/2021   PLT 174 10/15/2021   Lab Results  Component Value Date   NA 136 11/03/2021   K 4.5 11/03/2021   CO2 26 11/03/2021   GLUCOSE 77 11/03/2021   BUN 9 11/03/2021   CREATININE 0.71 11/03/2021   BILITOT 0.5 11/03/2021   ALKPHOS 74 11/03/2021   AST 17 11/03/2021   ALT 10 11/03/2021   PROT 6.7 11/03/2021   ALBUMIN 4.4 11/03/2021   CALCIUM 9.6 11/03/2021   ANIONGAP 6 10/15/2021   EGFR 89 11/03/2021   Lab Results  Component Value Date   CHOL 187 10/12/2021   Lab Results  Component Value Date   HDL 73 10/12/2021   Lab Results  Component Value Date   LDLCALC 103 (H) 10/12/2021   Lab Results  Component Value Date   TRIG 59 10/12/2021   Lab Results  Component Value Date   CHOLHDL 2.6 10/12/2021   Lab Results  Component Value Date   HGBA1C 5.3 10/12/2021       Assessment & Plan:   Problem List Items Addressed This Visit   None Visit Diagnoses     Gross hematuria    -  Primary   Relevant Orders   POCT URINALYSIS DIP (CLINITEK) (Completed)   Urine Culture Culture urine bu do CT abdomen and pelvis stat   Left lower quadrant abdominal pain       Relevant Orders   CT Abdomen Pelvis Wo Contrast (Completed) Do stat CT abdomen and pelvis, patient denies she need pain medicines        Orders Placed This Encounter  Procedures   Urine Culture   CT Abdomen Pelvis Wo  Contrast   POCT URINALYSIS DIP (CLINITEK)     Follow-up: Return in about 1 week (around 12/21/2021).  An After Visit Summary was printed and given to the patient.  [name], MD [facility] [phone]

## 2021-12-15 ENCOUNTER — Other Ambulatory Visit: Payer: Self-pay

## 2021-12-15 DIAGNOSIS — F4321 Adjustment disorder with depressed mood: Secondary | ICD-10-CM

## 2021-12-15 LAB — URINE CULTURE

## 2021-12-15 NOTE — Telephone Encounter (Signed)
Patient is requesting different pain medicine. Pain has not worsened, she is taking ibuprofen 800 mg every 8 hours as needed. Patient rates pain 9/10 and took ibuprofen a little over hour ago. She feels this is not phasing the pain.   [facility].  Royce Macadamia, [place] 12/15/21 10:32 AM

## 2021-12-15 NOTE — Telephone Encounter (Signed)
Unable to leave patient message due to no voicemail box.

## 2021-12-16 ENCOUNTER — Telehealth: Payer: Self-pay

## 2021-12-16 ENCOUNTER — Other Ambulatory Visit: Payer: Self-pay | Admitting: Family Medicine

## 2021-12-16 ENCOUNTER — Other Ambulatory Visit: Payer: Self-pay

## 2021-12-16 MED ORDER — IBUPROFEN 800 MG PO TABS: 800.0000 mg | ORAL_TABLET | Freq: Three times a day (TID) | ORAL | 2 refills | Status: DC | PRN

## 2021-12-16 MED ORDER — HYDROCODONE-ACETAMINOPHEN 5-325 MG PO TABS: 1.0000 | ORAL_TABLET | Freq: Four times a day (QID) | ORAL | 0 refills | Status: DC | PRN

## 2021-12-16 NOTE — Chronic Care Management (AMB) (Signed)
Chronic Care Management Pharmacy Assistant   Name: [name] [name]  MRN: [id number] DOB: 1945/10/06  Reason for Encounter: Medication Review/ Medication coordination  Recent office visits:  12-14-2021 Lillard Anes, MD. Abnormal UA. CT Abdomen Pelvis Wo Contrast completed.  Recent consult visits:  None  Hospital visits:  Medication Reconciliation was completed by comparing discharge summary, patient's EMR and Pharmacy list, and upon discussion with patient.  Admitted to the hospital on 10-15-2021 due to Dizziness Discharge date was 10-15-2021 Discharged from [place]?Medications Started at [facility] Discharge:?? Meclizine  Medication Changes at Hospital Discharge: None  Medications Discontinued at Hospital Discharge: None  Medications that remain the same after Hospital Discharge:??  -All other medications will remain the same.    Medications: Outpatient Encounter Medications as of 12/16/2021  Medication Sig   albuterol (PROVENTIL) (2.5 MG/3ML) 0.083% nebulizer solution Take 3 mLs (2.5 mg total) by nebulization every 6 (six) hours as needed for wheezing or shortness of breath.   albuterol (VENTOLIN HFA) 108 (90 Base) MCG/ACT inhaler INHALE TWO PUFFS BY MOUTH INTO LUNGS EVERY 6 HOURS AS NEEDED FOR SHORTNESS OF BREATH OR wheezing   aspirin EC 81 MG tablet Take 1 tablet (81 mg total) by mouth daily. Swallow whole.   Budeson-Glycopyrrol-Formoterol 160-9-4.8 MCG/ACT AERO Inhale 2 puffs into the lungs 2 (two) times daily as needed (shortness of breath).   buPROPion (WELLBUTRIN XL) 150 MG 24 hr tablet TAKE 1 TABLET(150 MG) BY MOUTH DAILY   cloNIDine (CATAPRES) 0.1 MG tablet Take 1 tablet (0.1 mg total) by mouth 2 (two) times daily.   diazepam (VALIUM) 5 MG tablet TAKE 1 TABLET BY MOUTH EVERY 12 HOURS AS NEEDED FOR ANXIETY. WATCH FOR SEDATION   ibuprofen (ADVIL) 800 MG tablet Take 1 tablet (800 mg total) by mouth every 8 (eight) hours as needed for moderate  pain.   isosorbide mononitrate (IMDUR) 30 MG 24 hr tablet Take 0.5 tablets (15 mg total) by mouth daily.   lisinopril-hydrochlorothiazide (ZESTORETIC) 20-12.5 MG tablet Take 2 tablets by mouth every morning.   meclizine (ANTIVERT) 25 MG tablet Take 1 tablet (25 mg total) by mouth 3 (three) times daily as needed for dizziness.   nitroGLYCERIN (NITROSTAT) 0.4 MG SL tablet Place 1 tablet (0.4 mg total) under the tongue every 5 (five) minutes as needed for chest pain.   omeprazole (PRILOSEC) 40 MG capsule Take 1 capsule (40 mg total) by mouth daily.   rosuvastatin (CRESTOR) 10 MG tablet Take 1 tablet (10 mg total) by mouth daily.   traZODone (DESYREL) 100 MG tablet Take 1 tablet (100 mg total) by mouth at bedtime.   No facility-administered encounter medications on file as of 12/16/2021.  Reviewed chart for medication changes ahead of medication coordination call.  No Consults or hospital visits since last care coordination call/Pharmacist visit.   No medication changes indicated OR if recent visit, treatment plan here.  BP Readings from Last 3 Encounters:  12/14/21 120/70  11/03/21 134/80  10/15/21 (!) 109/54    Lab Results  Component Value Date   HGBA1C 5.3 10/12/2021     Patient obtains medications through Adherence Packaging  30 Days   Last adherence delivery included:  Isosorbide Monoitrate 30 mg 0.5 tablet at Breakfast  Rosuvastatin 10 mg 1 at bedtime Clonidine 0.1 mg 1 at breakfast and 1 at Bedtime Bupropion XL 150 mg 1 at breakfast Trazodone 100 mg 1 at bedtime prn (Vials) Lisinopril-HCTZ 20-12.5 mg 2 at breakfast Omeprazole 40  mg 1 at breakfast  Alubterol Inhaler 56mg 2 puffs every 6hrs prn  Nitroglycerin 0.'4mg'$  1 tab every 5 mins as needed    Patient declined (meds) last month: meclizine not due until 12/07/21 Asprin '81mg'$ - Taking OTC  Diazepam '5mg'$ - Not taking taking  Breztri Inhaler- has enough on hand   Patient is due for next adherence delivery on:  12-28-2021  Called patient and reviewed medications and coordinated delivery.  This delivery to include: Isosorbide Monoitrate 30 mg 0.5 tablet at Breakfast  Rosuvastatin 10 mg 1 at bedtime Clonidine 0.1 mg 1 at breakfast and 1 at Bedtime Bupropion XL 150 mg 1 at breakfast Trazodone 100 mg 1 at bedtime prn (Vials) Lisinopril-HCTZ 20-12.5 mg 2 at breakfast Omeprazole 40 mg 1 at breakfast  Meclizine '25mg'$ - 1 tablet 3 times daily as needed  Ibuprofen 800 mg every 8 hours as needed  No short/acute fill needed  Patient declined the following medications: Albuterol- plenty of supply Nitroglycerin- plenty of supply  Patient needs refills for: request sent Ibuprofen  Confirmed delivery date of 12-28-2021, advised patient that pharmacy will contact them the morning of delivery.  Care Gaps: PNA Vac overdue Covid vaccine overdue Yearly ophthalmology Hep C Tdap overdue Shingrix overdue  Star Rating Drugs: Lisinopril-HCTZ 20-12.5 mg- Last filled 11-23-2021 30 DS Rosuvastatin 10 mg-  Last filled 11-23-2021 30 DS  [place] Pharmacist Assistant [phone]

## 2021-12-16 NOTE — Telephone Encounter (Signed)
https://dailymed.BrowserReview.ca [email or web address]=display  According to this article, splitting ISMN is contraindicated. Sent direct msg to Dr. Harriet Masson and will route a msg as well asking for clarification

## 2021-12-16 NOTE — Telephone Encounter (Signed)
http://www.saferx.co.nz/assets/Documents/Crushing-table-RAC.pdf  SeekHDTV.fr  These two resources say it can be split but not crushed. Let Dr. Harriet Masson know and will proceed filling script

## 2021-12-16 NOTE — Telephone Encounter (Signed)
Patient informed. Patient states that she is having 10/10 pain now and states that she is pushing her fluids and would like [facility]

## 2021-12-19 ENCOUNTER — Telehealth: Payer: Self-pay

## 2021-12-19 DIAGNOSIS — H903 Sensorineural hearing loss, bilateral: Secondary | ICD-10-CM | POA: Diagnosis not present

## 2021-12-19 NOTE — Telephone Encounter (Signed)
Patient calling this morning stating that she had passed a kidney stone this morning around 4 AM.

## 2021-12-21 ENCOUNTER — Ambulatory Visit (INDEPENDENT_AMBULATORY_CARE_PROVIDER_SITE_OTHER): Payer: Medicare Other | Admitting: Legal Medicine

## 2021-12-21 ENCOUNTER — Encounter: Payer: Self-pay | Admitting: Legal Medicine

## 2021-12-21 DIAGNOSIS — N200 Calculus of kidney: Secondary | ICD-10-CM

## 2021-12-21 HISTORY — DX: Calculus of kidney: N20.0

## 2021-12-21 NOTE — Progress Notes (Signed)
Acute Office Visit  Subjective:    Patient ID: [name] [name], female    DOB: 1946/01/29, 76 y.o.   MRN: [id number]  Chief Complaint  Patient presents with   Nephrolithiasis    HPI: Patient is in today for passed kidney stone Sunday.  No blood.  No further pain. Well hydrated.  Past Medical History:  Diagnosis Date   Anxiety    Aortic atherosclerosis ([place])    Ascending aorta dilation ([place])    a.) TTE 09/02/19 --> mild; measured 63m.   Asthma    CAD (coronary artery disease)    a,) CTA 08/24/20 --> mild CAD (25-49%) in RCA, LAD, LCx; CAC/Agatston score 509.   Cataract    Chronic left shoulder pain 09/27/2015   COPD (chronic obstructive pulmonary disease) (HCC)    Depression    Diastolic dysfunction    a.) TTE 09/02/19 --> LVEF 60-65%; G2DD. b.) TTE 07/23/20 --> LVEF 70-75%; G1DD.   GERD (gastroesophageal reflux disease)    History of kidney stones    Hyperlipidemia    Hypertension    Lupus (HCC)    Myocardial infarction ([facility]    Osteoarthritis    PAC (premature atrial contraction)    a.) Holter 12/04/19 --> occassional; 1.1% PAC burden.   Pain of right hip joint 11/22/2015   Paraesophageal hernia    Presbyesophagus    Primary osteoarthritis of left knee 02/04/2015   PSVT ([place]oxysmal [place]ventricular tachycardia) ([place]    a.) Holter 12/04/19 --> 22 runs.   Schatzki's ring    SOB (shortness of breath)    T2DM (type 2 diabetes mellitus) ([facility]     Past Surgical History:  Procedure Laterality Date   ABDOMINAL HYSTERECTOMY     BLADDER SUSPENSION     BREAST BIOPSY Right    COLONOSCOPY  09/04/2011   Colonic polyps, status post polyectomy. Incidental small ascending colon lipoma   ESOPHAGOGASTRODUODENOSCOPY  09/08/2015   Schatzki ring status post esophageal dilitation. Small hiatal hernia   hemorrhoid surgery     KNEE SURGERY     left   TOTAL KNEE ARTHROPLASTY Left 02/15/2015   Procedure: TOTAL KNEE ARTHROPLASTY;  Surgeon: SVickey Huger MD;  [place] [facility]   Service: Orthopedics;  Laterality: Left;   TUBAL LIGATION     WRIST SURGERY     right   XI ROBOTIC ASSISTED PARAESOPHAGEAL HERNIA REPAIR N/A 02/22/2021   Procedure: XI ROBOTIC ASSISTED PARAESOPHAGEAL HERNIA REPAIR with RNFA to assist;  Surgeon: PJules Husbands MD;  Location: [facility];  Service: General;  Laterality: N/A;    Family History  Problem Relation Age of Onset   Stroke Mother    Heart disease Mother    COPD Father    Cancer Father        Bone   Diabetes Father    Heart disease Father    Stroke Father    Lupus Sister    Seizures Son    COPD Son    Colon cancer Neg Hx    Esophageal cancer Neg Hx    Stomach cancer Neg Hx    Rectal cancer Neg Hx     Social History   Socioeconomic History   Marital status: Widowed    Spouse name: Not on file   Number of children: 4   Years of education: Not on file   Highest education level: Not on file  Occupational History   Occupation: Retired  Tobacco Use   Smoking status: Every Day    Packs/day:  1.00    Years: 47.00    Total pack years: 47.00    Types: Cigarettes   Smokeless tobacco: Never   Tobacco comments:    Smokes 1 pack daily 01/04/21 [facility]   Vaping Use   Vaping Use: Never used  Substance and Sexual Activity   Alcohol use: No   Drug use: No   Sexual activity: Not Currently  Other Topics Concern   Not on file  Social History Narrative   Not on file   Social Determinants of Health   Financial Resource Strain: Low Risk  (10/20/2021)   Overall Financial Resource Strain (CARDIA)    Difficulty of Paying Living Expenses: Not hard at all  Food Insecurity: No Food Insecurity (09/03/2020)   Hunger Vital Sign    Worried About Running Out of Food in the Last Year: Never true    Ran Out of Food in the Last Year: Never true  Transportation Needs: Unmet Transportation Needs (03/01/2021)   PRAPARE - Transportation    Lack of Transportation (Medical): Yes    Lack of Transportation (Non-Medical): Yes  Physical Activity:  Insufficiently Active (10/12/2020)   Exercise Vital Sign    Days of Exercise per Week: 2 days    Minutes of Exercise per Session: 20 min  Stress: Stress Concern Present (10/12/2020)   [place]    Feeling of Stress : To some extent  Social Connections: Socially Isolated (10/12/2020)   Social Connection and Isolation Panel [NHANES]    Frequency of Communication with Friends and Family: Three times a week    Frequency of Social Gatherings with Friends and Family: Never    Attends Religious Services: Never    Marine scientist or Organizations: No    Attends Archivist Meetings: Never    Marital Status: Widowed  Intimate Partner Violence: Not At Risk (10/12/2020)   Humiliation, Afraid, Rape, and Kick questionnaire    Fear of Current or Ex-Partner: No    Emotionally Abused: No    Physically Abused: No    Sexually Abused: No    Outpatient Medications Prior to Visit  Medication Sig Dispense Refill   albuterol (PROVENTIL) (2.5 MG/3ML) 0.083% nebulizer solution Take 3 mLs (2.5 mg total) by nebulization every 6 (six) hours as needed for wheezing or shortness of breath. 120 mL 6   albuterol (VENTOLIN HFA) 108 (90 Base) MCG/ACT inhaler INHALE TWO PUFFS BY MOUTH INTO LUNGS EVERY 6 HOURS AS NEEDED FOR SHORTNESS OF BREATH OR wheezing 8.5 g 6   aspirin EC 81 MG tablet Take 1 tablet (81 mg total) by mouth daily. Swallow whole. 90 tablet 3   Budeson-Glycopyrrol-Formoterol 160-9-4.8 MCG/ACT AERO Inhale 2 puffs into the lungs 2 (two) times daily as needed (shortness of breath). 10.7 g 2   buPROPion (WELLBUTRIN XL) 150 MG 24 hr tablet TAKE 1 TABLET(150 MG) BY MOUTH DAILY 90 tablet 2   cloNIDine (CATAPRES) 0.1 MG tablet Take 1 tablet (0.1 mg total) by mouth 2 (two) times daily. 180 tablet 3   diazepam (VALIUM) 5 MG tablet TAKE 1 TABLET BY MOUTH EVERY 12 HOURS AS NEEDED FOR ANXIETY. WATCH FOR SEDATION 60 tablet 3    HYDROcodone-acetaminophen (NORCO/VICODIN) 5-325 MG tablet Take 1 tablet by mouth every 6 (six) hours as needed for severe pain (kidney stones). 20 tablet 0   ibuprofen (ADVIL) 800 MG tablet Take 1 tablet (800 mg total) by mouth every 8 (eight) hours as needed for moderate pain.  90 tablet 2   isosorbide mononitrate (IMDUR) 30 MG 24 hr tablet Take 0.5 tablets (15 mg total) by mouth daily. 45 tablet 3   lisinopril-hydrochlorothiazide (ZESTORETIC) 20-12.5 MG tablet Take 2 tablets by mouth every morning. 180 tablet 1   meclizine (ANTIVERT) 25 MG tablet Take 1 tablet (25 mg total) by mouth 3 (three) times daily as needed for dizziness. 180 tablet 1   nitroGLYCERIN (NITROSTAT) 0.4 MG SL tablet Place 1 tablet (0.4 mg total) under the tongue every 5 (five) minutes as needed for chest pain. 25 tablet 3   omeprazole (PRILOSEC) 40 MG capsule Take 1 capsule (40 mg total) by mouth daily. 90 capsule 1   rosuvastatin (CRESTOR) 10 MG tablet Take 1 tablet (10 mg total) by mouth daily. 90 tablet 1   traZODone (DESYREL) 100 MG tablet Take 1 tablet (100 mg total) by mouth at bedtime. 90 tablet 1   No facility-administered medications prior to visit.    Allergies  Allergen Reactions   Meperidine Anaphylaxis and Other (See Comments)    Blood pressure dropped, also   Influenza Vaccines Other (See Comments)    Flu-like symptoms   Other Rash and Other (See Comments)    asparagus    Review of Systems  Constitutional:  Negative for chills, fatigue and fever.  HENT:  Negative for congestion, ear pain and sore throat.   Eyes:  Negative for visual disturbance.  Respiratory:  Negative for cough and shortness of breath.   Cardiovascular:  Negative for chest pain and palpitations.  Gastrointestinal:  Positive for abdominal pain. Negative for constipation, diarrhea, nausea and vomiting.  Endocrine: Negative for polydipsia, polyphagia and polyuria.  Genitourinary:  Negative for difficulty urinating and dysuria.   Musculoskeletal:  Negative for arthralgias, back pain and myalgias.  Skin:  Negative for rash.  Neurological:  Negative for headaches.  Psychiatric/Behavioral:  Negative for dysphoric mood. The patient is not nervous/anxious.        Objective:    Physical Exam Vitals reviewed.  Constitutional:      General: She is not in acute distress.    Appearance: Normal appearance.  HENT:     Right Ear: Tympanic membrane normal.     Left Ear: Tympanic membrane normal.  Eyes:     Conjunctiva/sclera: Conjunctivae normal.     Pupils: Pupils are equal, round, and reactive to light.  Cardiovascular:     Rate and Rhythm: Normal rate and regular rhythm.     Pulses: Normal pulses.     Heart sounds: Normal heart sounds. No murmur heard.    No gallop.  Pulmonary:     Effort: Pulmonary effort is normal. No respiratory distress.     Breath sounds: Normal breath sounds. No wheezing.  Abdominal:     General: Abdomen is flat. Bowel sounds are normal. There is no distension.     Tenderness: There is no abdominal tenderness. There is no right CVA tenderness or left CVA tenderness.  Musculoskeletal:     Cervical back: Normal range of motion and neck supple.  Skin:    Capillary Refill: Capillary refill takes less than 2 seconds.  Neurological:     General: No focal deficit present.     Mental Status: She is alert and oriented to person, place, and time.     BP 124/70   Pulse 65   Temp 97.9 F (36.6 C)   Resp 15   Ht '5\' 2"'  (1.575 m)   Wt 139 lb (63 kg)  SpO2 93%   BMI 25.42 kg/m  Wt Readings from Last 3 Encounters:  12/21/21 139 lb (63 kg)  12/14/21 138 lb (62.6 kg)  11/03/21 139 lb (63 kg)    Health Maintenance Due  Topic Date Due   COVID-19 Vaccine (1) Never done   Pneumonia Vaccine 68+ Years old (1 - PCV) Never done   OPHTHALMOLOGY EXAM  Never done   Hepatitis C Screening  Never done   TETANUS/TDAP  Never done   Zoster Vaccines- Shingrix (1 of 2) Never done    There are no  preventive care reminders to display for this patient.   Lab Results  Component Value Date   TSH 1.530 11/03/2021   Lab Results  Component Value Date   WBC 4.7 10/15/2021   HGB 12.8 10/15/2021   HCT 37.7 10/15/2021   MCV 95.0 10/15/2021   PLT 174 10/15/2021   Lab Results  Component Value Date   NA 136 11/03/2021   K 4.5 11/03/2021   CO2 26 11/03/2021   GLUCOSE 77 11/03/2021   BUN 9 11/03/2021   CREATININE 0.71 11/03/2021   BILITOT 0.5 11/03/2021   ALKPHOS 74 11/03/2021   AST 17 11/03/2021   ALT 10 11/03/2021   PROT 6.7 11/03/2021   ALBUMIN 4.4 11/03/2021   CALCIUM 9.6 11/03/2021   ANIONGAP 6 10/15/2021   EGFR 89 11/03/2021   Lab Results  Component Value Date   CHOL 187 10/12/2021   Lab Results  Component Value Date   HDL 73 10/12/2021   Lab Results  Component Value Date   LDLCALC 103 (H) 10/12/2021   Lab Results  Component Value Date   TRIG 59 10/12/2021   Lab Results  Component Value Date   CHOLHDL 2.6 10/12/2021   Lab Results  Component Value Date   HGBA1C 5.3 10/12/2021       Assessment & Plan:   Problem List Items Addressed This Visit       Genitourinary   Renal calculus Patient passed renal calculus and is now without symptoms, she will drink more water this summer        Follow-up: Return if symptoms worsen or fail to improve.  An After Visit Summary was printed and given to the patient.  [name], MD [facility] [phone]

## 2022-01-02 ENCOUNTER — Other Ambulatory Visit: Payer: Self-pay

## 2022-01-02 DIAGNOSIS — F4321 Adjustment disorder with depressed mood: Secondary | ICD-10-CM

## 2022-01-02 MED ORDER — DIAZEPAM 5 MG PO TABS: ORAL_TABLET | ORAL | 3 refills | Status: DC

## 2022-01-05 ENCOUNTER — Telehealth: Payer: Self-pay

## 2022-01-05 NOTE — Progress Notes (Signed)
Care Gap(s) Not Met that Need to be Addressed:   Controlling High Blood Pressure   Action Taken: Updated care gap list with latest BP 12/21/21 124/70   Follow Up: 01/19/22 with CPP

## 2022-01-09 DIAGNOSIS — R310 Gross hematuria: Secondary | ICD-10-CM | POA: Diagnosis not present

## 2022-01-09 DIAGNOSIS — N202 Calculus of kidney with calculus of ureter: Secondary | ICD-10-CM | POA: Diagnosis not present

## 2022-01-16 ENCOUNTER — Telehealth: Payer: Self-pay

## 2022-01-16 NOTE — Progress Notes (Signed)
Chronic Care Management Pharmacy Assistant   Name: [name] [name]  MRN: [id number] DOB: 07/12/1945   Reason for Encounter: Medication Coordination for [facility]    Recent office visits:  12/21/21 [name] MD. Seen for Renal Calculus. No med changes.  12/16/21 Rochel Brome, MD. Orders Only. Ordered Norco 5-'325mg'$ .   Recent consult visits:  None  Hospital visits:  None  Medications: Outpatient Encounter Medications as of 01/16/2022  Medication Sig   albuterol (PROVENTIL) (2.5 MG/3ML) 0.083% nebulizer solution Take 3 mLs (2.5 mg total) by nebulization every 6 (six) hours as needed for wheezing or shortness of breath.   albuterol (VENTOLIN HFA) 108 (90 Base) MCG/ACT inhaler INHALE TWO PUFFS BY MOUTH INTO LUNGS EVERY 6 HOURS AS NEEDED FOR SHORTNESS OF BREATH OR wheezing   aspirin EC 81 MG tablet Take 1 tablet (81 mg total) by mouth daily. Swallow whole.   Budeson-Glycopyrrol-Formoterol 160-9-4.8 MCG/ACT AERO Inhale 2 puffs into the lungs 2 (two) times daily as needed (shortness of breath).   buPROPion (WELLBUTRIN XL) 150 MG 24 hr tablet TAKE 1 TABLET(150 MG) BY MOUTH DAILY   cloNIDine (CATAPRES) 0.1 MG tablet Take 1 tablet (0.1 mg total) by mouth 2 (two) times daily.   diazepam (VALIUM) 5 MG tablet TAKE 1 TABLET BY MOUTH EVERY 12 HOURS AS NEEDED FOR ANXIETY. WATCH FOR SEDATION   HYDROcodone-acetaminophen (NORCO/VICODIN) 5-325 MG tablet Take 1 tablet by mouth every 6 (six) hours as needed for severe pain (kidney stones).   ibuprofen (ADVIL) 800 MG tablet Take 1 tablet (800 mg total) by mouth every 8 (eight) hours as needed for moderate pain.   isosorbide mononitrate (IMDUR) 30 MG 24 hr tablet Take 0.5 tablets (15 mg total) by mouth daily.   lisinopril-hydrochlorothiazide (ZESTORETIC) 20-12.5 MG tablet Take 2 tablets by mouth every morning.   meclizine (ANTIVERT) 25 MG tablet Take 1 tablet (25 mg total) by mouth 3 (three) times daily as needed for dizziness.   nitroGLYCERIN (NITROSTAT)  0.4 MG SL tablet Place 1 tablet (0.4 mg total) under the tongue every 5 (five) minutes as needed for chest pain.   omeprazole (PRILOSEC) 40 MG capsule Take 1 capsule (40 mg total) by mouth daily.   rosuvastatin (CRESTOR) 10 MG tablet Take 1 tablet (10 mg total) by mouth daily.   traZODone (DESYREL) 100 MG tablet Take 1 tablet (100 mg total) by mouth at bedtime.   No facility-administered encounter medications on file as of 01/16/2022.    Reviewed chart for medication changes ahead of medication coordination call.  No Consults, or hospital visits since last care coordination call/Pharmacist visit.   BP Readings from Last 3 Encounters:  12/21/21 124/70  12/14/21 120/70  11/03/21 134/80    Lab Results  Component Value Date   HGBA1C 5.3 10/12/2021     Patient obtains medications through Adherence Packaging  30 Days   Last adherence delivery included: Isosorbide Monoitrate 30 mg 0.5 tablet at Breakfast  Rosuvastatin 10 mg 1 at bedtime Clonidine 0.1 mg 1 at breakfast and 1 at Bedtime Bupropion XL 150 mg 1 at breakfast Trazodone 100 mg 1 at bedtime prn (Vials) Lisinopril-HCTZ 20-12.5 mg 2 at breakfast Omeprazole 40 mg 1 at breakfast  Meclizine '25mg'$ - 1 tablet 3 times daily as needed  Ibuprofen 800 mg every 8 hours as needed  Patient declined (meds) last month  Albuterol- plenty of supply Nitroglycerin- plenty of supply  Patient is due for next adherence delivery on: 01/26/22. Called patient and reviewed medications and coordinated  delivery.  This delivery to include: Isosorbide Monoitrate 30 mg 0.5 tablet at Breakfast  Rosuvastatin 10 mg 1 at bedtime Clonidine 0.1 mg 1 at breakfast and 1 at Bedtime Bupropion XL 150 mg 1 at breakfast Trazodone 100 mg 1 at bedtime prn (Vials) Lisinopril-HCTZ 20-12.5 mg 2 at breakfast Omeprazole 40 mg 1 at breakfast  Meclizine '25mg'$ - 1 tablet 3 times daily as needed  Ibuprofen 800 mg every 8 hours as needed  Patient declined the following  medications  Diazepam cant be filled until 02/01/22- Future fill set Aspirin '81mg'$ - Buys OTC Albuterol- plenty of supply Nitroglycerin- plenty of supply Breztri Inhaler- Pt still has 2 inhalers on hand, uses prn  Patient needs refills  None  Confirmed delivery date of 01/26/22, advised patient that pharmacy will contact them the morning of delivery.   Elray Mcgregor, [place] Pharmacist Assistant  [phone]

## 2022-01-19 ENCOUNTER — Telehealth: Payer: Medicare Other

## 2022-01-25 ENCOUNTER — Ambulatory Visit: Payer: Medicare Other | Admitting: Physician Assistant

## 2022-01-31 ENCOUNTER — Ambulatory Visit (INDEPENDENT_AMBULATORY_CARE_PROVIDER_SITE_OTHER): Payer: Medicare Other | Admitting: Legal Medicine

## 2022-01-31 ENCOUNTER — Encounter: Payer: Self-pay | Admitting: Legal Medicine

## 2022-01-31 VITALS — BP 130/60 | HR 88 | Temp 97.9°F | Resp 15 | Ht 62.0 in | Wt 136.0 lb

## 2022-01-31 DIAGNOSIS — M5430 Sciatica, unspecified side: Secondary | ICD-10-CM

## 2022-01-31 DIAGNOSIS — M47816 Spondylosis without myelopathy or radiculopathy, lumbar region: Secondary | ICD-10-CM

## 2022-01-31 DIAGNOSIS — M5432 Sciatica, left side: Secondary | ICD-10-CM

## 2022-01-31 DIAGNOSIS — M5431 Sciatica, right side: Secondary | ICD-10-CM | POA: Diagnosis not present

## 2022-01-31 HISTORY — DX: Sciatica, unspecified side: M54.30

## 2022-01-31 MED ORDER — OXYCODONE-ACETAMINOPHEN 10-325 MG PO TABS
1.0000 | ORAL_TABLET | Freq: Three times a day (TID) | ORAL | 0 refills | Status: DC | PRN
Start: 2022-01-31 — End: 2022-06-26

## 2022-01-31 NOTE — Progress Notes (Signed)
Acute Office Visit  Subjective:    Patient ID: [name] [name], female    DOB: 05-18-1946, 76 y.o.   MRN: [id number]  Chief Complaint  Patient presents with   Back Pain   Hip Pain    HPI: Patient is in today for aching constant pain on midline with radiation to right sided and going down to the right hip. It is hard to walk and sit down for long period of time, she feels numb. This pain has been for 14 years. Off and on.  2 weeks ago sudden onset low back pain and sciatica. Drove to [place].  Radiates down legs. She has been to pain clinic for narcotic, she had one epidural.  Pain doctor does not want to use neural stimulator. Using ibuprofen and percocet for kidney stone. She was seen by Dr. Nelva Bush.  Past Medical History:  Diagnosis Date   Anxiety    Aortic atherosclerosis ([place])    Ascending aorta dilation ([place])    a.) TTE 09/02/19 --> mild; measured 76m.   Asthma    CAD (coronary artery disease)    a,) CTA 08/24/20 --> mild CAD (25-49%) in RCA, LAD, LCx; CAC/Agatston score 509.   Cataract    Chronic left shoulder pain 09/27/2015   COPD (chronic obstructive pulmonary disease) (HCC)    Depression    Diastolic dysfunction    a.) TTE 09/02/19 --> LVEF 60-65%; G2DD. b.) TTE 07/23/20 --> LVEF 70-75%; G1DD.   GERD (gastroesophageal reflux disease)    History of kidney stones    Hyperlipidemia    Hypertension    Lupus (HCC)    Myocardial infarction ([place]    Osteoarthritis    PAC (premature atrial contraction)    a.) Holter 12/04/19 --> occassional; 1.1% PAC burden.   Pain of right hip joint 11/22/2015   Paraesophageal hernia    Presbyesophagus    Primary osteoarthritis of left knee 02/04/2015   PSVT (paroxysmal supraventricular tachycardia) ([place]    a.) Holter 12/04/19 --> 22 runs.   Schatzki's ring    SOB (shortness of breath)    T2DM (type 2 diabetes mellitus) ([place]     Past Surgical History:  Procedure Laterality Date   ABDOMINAL HYSTERECTOMY     BLADDER SUSPENSION      BREAST BIOPSY Right    COLONOSCOPY  09/04/2011   Colonic polyps, status post polyectomy. Incidental small ascending colon lipoma   ESOPHAGOGASTRODUODENOSCOPY  09/08/2015   Schatzki ring status post esophageal dilitation. Small hiatal hernia   hemorrhoid surgery     KNEE SURGERY     left   TOTAL KNEE ARTHROPLASTY Left 02/15/2015   Procedure: TOTAL KNEE ARTHROPLASTY;  Surgeon: SVickey Huger MD;  [facility] [facility]  Service: Orthopedics;  Laterality: Left;   TUBAL LIGATION     WRIST SURGERY     right   XI ROBOTIC ASSISTED PARAESOPHAGEAL HERNIA REPAIR N/A 02/22/2021   Procedure: XI ROBOTIC ASSISTED PARAESOPHAGEAL HERNIA REPAIR with RNFA to assist;  Surgeon: PJules Husbands MD;  Location: [facility];  Service: General;  Laterality: N/A;    Family History  Problem Relation Age of Onset   Stroke Mother    Heart disease Mother    COPD Father    Cancer Father        Bone   Diabetes Father    Heart disease Father    Stroke Father    Lupus Sister    Seizures Son    COPD Son    Colon cancer  Neg Hx    Esophageal cancer Neg Hx    Stomach cancer Neg Hx    Rectal cancer Neg Hx     Social History   Socioeconomic History   Marital status: Widowed    Spouse name: Not on file   Number of children: 4   Years of education: Not on file   Highest education level: Not on file  Occupational History   Occupation: Retired  Tobacco Use   Smoking status: Every Day    Packs/day: 1.00    Years: 47.00    Total pack years: 47.00    Types: Cigarettes   Smokeless tobacco: Never   Tobacco comments:    Smokes 1 pack daily 01/04/21 [facility]   Vaping Use   Vaping Use: Never used  Substance and Sexual Activity   Alcohol use: No   Drug use: No   Sexual activity: Not Currently  Other Topics Concern   Not on file  Social History Narrative   Not on file   Social Determinants of Health   Financial Resource Strain: Low Risk  (10/20/2021)   Overall Financial Resource Strain (CARDIA)    Difficulty of  Paying Living Expenses: Not hard at all  Food Insecurity: No Food Insecurity (09/03/2020)   Hunger Vital Sign    Worried About Running Out of Food in the Last Year: Never true    [place] in the Last Year: Never true  Transportation Needs: Unmet Transportation Needs (03/01/2021)   PRAPARE - Transportation    Lack of Transportation (Medical): Yes    Lack of Transportation (Non-Medical): Yes  Physical Activity: Insufficiently Active (10/12/2020)   Exercise Vital Sign    Days of Exercise per Week: 2 days    Minutes of Exercise per Session: 20 min  Stress: Stress Concern Present (10/12/2020)   [place]    Feeling of Stress : To some extent  Social Connections: Socially Isolated (10/12/2020)   Social Connection and Isolation Panel [NHANES]    Frequency of Communication with Friends and Family: Three times a week    Frequency of Social Gatherings with Friends and Family: Never    Attends Religious Services: Never    Marine scientist or Organizations: No    Attends Archivist Meetings: Never    Marital Status: Widowed  Intimate Partner Violence: Not At Risk (10/12/2020)   Humiliation, Afraid, Rape, and Kick questionnaire    Fear of Current or Ex-Partner: No    Emotionally Abused: No    Physically Abused: No    Sexually Abused: No    Outpatient Medications Prior to Visit  Medication Sig Dispense Refill   albuterol (PROVENTIL) (2.5 MG/3ML) 0.083% nebulizer solution Take 3 mLs (2.5 mg total) by nebulization every 6 (six) hours as needed for wheezing or shortness of breath. 120 mL 6   albuterol (VENTOLIN HFA) 108 (90 Base) MCG/ACT inhaler INHALE TWO PUFFS BY MOUTH INTO LUNGS EVERY 6 HOURS AS NEEDED FOR SHORTNESS OF BREATH OR wheezing 8.5 g 6   aspirin EC 81 MG tablet Take 1 tablet (81 mg total) by mouth daily. Swallow whole. 90 tablet 3   Budeson-Glycopyrrol-Formoterol 160-9-4.8 MCG/ACT AERO Inhale 2 puffs  into the lungs 2 (two) times daily as needed (shortness of breath). 10.7 g 2   buPROPion (WELLBUTRIN XL) 150 MG 24 hr tablet TAKE 1 TABLET(150 MG) BY MOUTH DAILY 90 tablet 2   cloNIDine (CATAPRES) 0.1 MG tablet Take 1  tablet (0.1 mg total) by mouth 2 (two) times daily. 180 tablet 3   diazepam (VALIUM) 5 MG tablet TAKE 1 TABLET BY MOUTH EVERY 12 HOURS AS NEEDED FOR ANXIETY. WATCH FOR SEDATION 60 tablet 3   ibuprofen (ADVIL) 800 MG tablet Take 1 tablet (800 mg total) by mouth every 8 (eight) hours as needed for moderate pain. 90 tablet 2   isosorbide mononitrate (IMDUR) 30 MG 24 hr tablet Take 0.5 tablets (15 mg total) by mouth daily. 45 tablet 3   lisinopril-hydrochlorothiazide (ZESTORETIC) 20-12.5 MG tablet Take 2 tablets by mouth every morning. 180 tablet 1   meclizine (ANTIVERT) 25 MG tablet Take 1 tablet (25 mg total) by mouth 3 (three) times daily as needed for dizziness. 180 tablet 1   omeprazole (PRILOSEC) 40 MG capsule Take 1 capsule (40 mg total) by mouth daily. 90 capsule 1   rosuvastatin (CRESTOR) 10 MG tablet Take 1 tablet (10 mg total) by mouth daily. 90 tablet 1   traZODone (DESYREL) 100 MG tablet Take 1 tablet (100 mg total) by mouth at bedtime. 90 tablet 1   HYDROcodone-acetaminophen (NORCO/VICODIN) 5-325 MG tablet Take 1 tablet by mouth every 6 (six) hours as needed for severe pain (kidney stones). 20 tablet 0   nitroGLYCERIN (NITROSTAT) 0.4 MG SL tablet Place 1 tablet (0.4 mg total) under the tongue every 5 (five) minutes as needed for chest pain. 25 tablet 3   No facility-administered medications prior to visit.    Allergies  Allergen Reactions   Meperidine Anaphylaxis and Other (See Comments)    Blood pressure dropped, also   Meperidine Hcl Anaphylaxis and Other (See Comments)    B/P dropped, also   Influenza Vaccines Other (See Comments)    Flu-like symptoms   Other Rash and Other (See Comments)    asparagus    Review of Systems  Constitutional:  Negative for chills,  fatigue and fever.  HENT:  Negative for congestion, ear pain and sore throat.   Eyes:  Negative for visual disturbance.  Respiratory:  Negative for cough and shortness of breath.   Cardiovascular:  Negative for chest pain and palpitations.  Gastrointestinal:  Negative for abdominal pain, constipation, diarrhea, nausea and vomiting.  Endocrine: Negative for polydipsia, polyphagia and polyuria.  Genitourinary:  Negative for difficulty urinating and dysuria.  Musculoskeletal:  Positive for arthralgias (right hip pain) and back pain (right sided). Negative for myalgias.  Skin:  Negative for rash.  Neurological:  Negative for headaches.  Psychiatric/Behavioral:  Negative for dysphoric mood. The patient is not nervous/anxious.        Objective:    Physical Exam Vitals reviewed.  Constitutional:      Appearance: Normal appearance.  HENT:     Head: Normocephalic.     Right Ear: Tympanic membrane normal.     Left Ear: Tympanic membrane normal.     Nose: Nose normal.     Mouth/Throat:     Mouth: Mucous membranes are moist.  Eyes:     Extraocular Movements: Extraocular movements intact.     Pupils: Pupils are equal, round, and reactive to light.  Cardiovascular:     Rate and Rhythm: Normal rate and regular rhythm.     Pulses: Normal pulses.     Heart sounds: No murmur heard.    No gallop.  Pulmonary:     Effort: Pulmonary effort is normal. No respiratory distress.     Breath sounds: Normal breath sounds. No wheezing.  Abdominal:     General:  Abdomen is flat. Bowel sounds are normal. There is no distension.     Palpations: Abdomen is soft.     Tenderness: There is no abdominal tenderness.  Musculoskeletal:     Cervical back: Normal range of motion.       Back:     Right lower leg: No edema.     Left lower leg: No edema.     Comments: Painl4-S1 with radiation down both legs, no red flag signs  Skin:    General: Skin is warm.     Capillary Refill: Capillary refill takes less  than 2 seconds.  Neurological:     General: No focal deficit present.     Mental Status: She is alert and oriented to person, place, and time.     Motor: No weakness.     Gait: Gait normal.  Psychiatric:        Mood and Affect: Mood normal.     BP 130/60   Pulse 88   Temp 97.9 F (36.6 C)   Resp 15   Ht '5\' 2"'  (1.575 m)   Wt 136 lb (61.7 kg)   BMI 24.87 kg/m  Wt Readings from Last 3 Encounters:  01/31/22 136 lb (61.7 kg)  12/21/21 139 lb (63 kg)  12/14/21 138 lb (62.6 kg)    Health Maintenance Due  Topic Date Due   COVID-19 Vaccine (1) Never done   Pneumonia Vaccine 62+ Years old (1 - PCV) Never done   OPHTHALMOLOGY EXAM  Never done   Hepatitis C Screening  Never done   TETANUS/TDAP  Never done   Zoster Vaccines- Shingrix (1 of 2) Never done   INFLUENZA VACCINE  12/27/2021    There are no preventive care reminders to display for this patient.   Lab Results  Component Value Date   TSH 1.530 11/03/2021   Lab Results  Component Value Date   WBC 4.7 10/15/2021   HGB 12.8 10/15/2021   HCT 37.7 10/15/2021   MCV 95.0 10/15/2021   PLT 174 10/15/2021   Lab Results  Component Value Date   NA 136 11/03/2021   K 4.5 11/03/2021   CO2 26 11/03/2021   GLUCOSE 77 11/03/2021   BUN 9 11/03/2021   CREATININE 0.71 11/03/2021   BILITOT 0.5 11/03/2021   ALKPHOS 74 11/03/2021   AST 17 11/03/2021   ALT 10 11/03/2021   PROT 6.7 11/03/2021   ALBUMIN 4.4 11/03/2021   CALCIUM 9.6 11/03/2021   ANIONGAP 6 10/15/2021   EGFR 89 11/03/2021   Lab Results  Component Value Date   CHOL 187 10/12/2021   Lab Results  Component Value Date   HDL 73 10/12/2021   Lab Results  Component Value Date   LDLCALC 103 (H) 10/12/2021   Lab Results  Component Value Date   TRIG 59 10/12/2021   Lab Results  Component Value Date   CHOLHDL 2.6 10/12/2021   Lab Results  Component Value Date   HGBA1C 5.3 10/12/2021       Assessment & Plan:   Problem List Items Addressed This  Visit       Nervous and Auditory   Sciatica   Relevant Medications   oxyCODONE-acetaminophen (PERCOCET) 10-325 MG tablet   Other Relevant Orders   Ambulatory referral to Neurology Patient is having sciatica down both her legs for the last 2 weeks she has had back surgeries in the past and has been seeing neurologist for this.  She is on pain medicine from the  pain clinic.  We discussed getting an x-ray and possibly repeat MRI.   Other Visit Diagnoses     Lumbar spondylosis    -  Primary   Relevant Medications   oxyCODONE-acetaminophen (PERCOCET) 10-325 MG tablet   Other Relevant Orders   DG Lumbar Spine Complete   MR Lumbar Spine Wo Contrast   Ambulatory referral to Neurology Patient has a long history of lumbar spondylosis and we will start some physical therapy as well as using some pain medications we will get a MRI of her back also.        Orders Placed This Encounter  Procedures   DG Lumbar Spine Complete   MR Lumbar Spine Wo Contrast   Ambulatory referral to Neurology     Follow-up: Return in about 2 weeks (around 02/14/2022), or back.  An After Visit Summary was printed and given to the patient.  [name], MD [facility] [phone]

## 2022-02-02 DIAGNOSIS — M47816 Spondylosis without myelopathy or radiculopathy, lumbar region: Secondary | ICD-10-CM | POA: Diagnosis not present

## 2022-02-07 ENCOUNTER — Encounter: Payer: Self-pay | Admitting: Legal Medicine

## 2022-02-07 ENCOUNTER — Ambulatory Visit (INDEPENDENT_AMBULATORY_CARE_PROVIDER_SITE_OTHER): Payer: Medicare Other | Admitting: Legal Medicine

## 2022-02-07 DIAGNOSIS — U071 COVID-19: Secondary | ICD-10-CM

## 2022-02-07 DIAGNOSIS — J069 Acute upper respiratory infection, unspecified: Secondary | ICD-10-CM | POA: Diagnosis not present

## 2022-02-07 LAB — POC COVID19 BINAXNOW: SARS Coronavirus 2 Ag: POSITIVE — AB

## 2022-02-07 MED ORDER — NIRMATRELVIR/RITONAVIR (PAXLOVID)TABLET: 3.0000 | ORAL_TABLET | Freq: Two times a day (BID) | ORAL | 0 refills | Status: AC

## 2022-02-07 NOTE — Progress Notes (Signed)
Patient has cough and congestion, positive Covid, unable to do videovisit.  Sent in medicine paxlovid and follow up if not improving [facility]

## 2022-02-14 ENCOUNTER — Encounter: Payer: Self-pay | Admitting: Legal Medicine

## 2022-02-14 ENCOUNTER — Ambulatory Visit (INDEPENDENT_AMBULATORY_CARE_PROVIDER_SITE_OTHER): Payer: Medicare Other | Admitting: Legal Medicine

## 2022-02-14 VITALS — BP 90/50 | HR 77 | Temp 96.7°F | Resp 16 | Ht 62.0 in | Wt 134.0 lb

## 2022-02-14 DIAGNOSIS — I471 Supraventricular tachycardia: Secondary | ICD-10-CM | POA: Diagnosis not present

## 2022-02-14 DIAGNOSIS — K2100 Gastro-esophageal reflux disease with esophagitis, without bleeding: Secondary | ICD-10-CM

## 2022-02-14 DIAGNOSIS — I10 Essential (primary) hypertension: Secondary | ICD-10-CM | POA: Diagnosis not present

## 2022-02-14 DIAGNOSIS — Z6824 Body mass index (BMI) 24.0-24.9, adult: Secondary | ICD-10-CM

## 2022-02-14 DIAGNOSIS — E1169 Type 2 diabetes mellitus with other specified complication: Secondary | ICD-10-CM | POA: Diagnosis not present

## 2022-02-14 DIAGNOSIS — E782 Mixed hyperlipidemia: Secondary | ICD-10-CM | POA: Diagnosis not present

## 2022-02-14 DIAGNOSIS — I2510 Atherosclerotic heart disease of native coronary artery without angina pectoris: Secondary | ICD-10-CM

## 2022-02-14 DIAGNOSIS — F322 Major depressive disorder, single episode, severe without psychotic features: Secondary | ICD-10-CM

## 2022-02-14 DIAGNOSIS — I4719 Other supraventricular tachycardia: Secondary | ICD-10-CM

## 2022-02-14 DIAGNOSIS — J410 Simple chronic bronchitis: Secondary | ICD-10-CM | POA: Diagnosis not present

## 2022-02-14 HISTORY — DX: Body mass index (BMI) 24.0-24.9, adult: Z68.24

## 2022-02-14 MED ORDER — ALBUTEROL SULFATE (2.5 MG/3ML) 0.083% IN NEBU: 2.5000 mg | INHALATION_SOLUTION | Freq: Four times a day (QID) | RESPIRATORY_TRACT | 6 refills | Status: DC | PRN

## 2022-02-14 NOTE — Progress Notes (Signed)
Subjective:  Patient ID: [name] [name], female    DOB: 07-03-1945  Age: 76 y.o. MRN: [id number]  Chief Complaint  Patient presents with   Hyperlipidemia   Diabetes   Hypertension    HPI  Patient presents with hyperlipidemia.  Compliance with treatment has been good; patient takes medicines as directed, maintains low cholesterol diet, follows up as directed, and maintains exercise regimen.  Patient is using Rosuvastatin 10 mg daily without problems.   Patient presents for follow up of hypertension.  Patient tolerating Lisinopril-Hctz 20-12.5 mg every morning, Clonidine 0.1 twice a day , aspirin 81 mg daily well without side effects.   COPD: Albuterol nebulizer  2.5 mg every 6 hours, albuterol inhaler 2 puffs twice a day.   GERD: Omeprazole 40 mg daily still gets GERD and uses every day. CAD occasional pain using NTG. Current Outpatient Medications on File Prior to Visit  Medication Sig Dispense Refill   aspirin EC 81 MG tablet Take 1 tablet (81 mg total) by mouth daily. Swallow whole. 90 tablet 3   Budeson-Glycopyrrol-Formoterol 160-9-4.8 MCG/ACT AERO Inhale 2 puffs into the lungs 2 (two) times daily as needed (shortness of breath). 10.7 g 2   buPROPion (WELLBUTRIN XL) 150 MG 24 hr tablet TAKE 1 TABLET(150 MG) BY MOUTH DAILY 90 tablet 2   cloNIDine (CATAPRES) 0.1 MG tablet Take 1 tablet (0.1 mg total) by mouth 2 (two) times daily. 180 tablet 3   diazepam (VALIUM) 5 MG tablet TAKE 1 TABLET BY MOUTH EVERY 12 HOURS AS NEEDED FOR ANXIETY. WATCH FOR SEDATION 60 tablet 3   ibuprofen (ADVIL) 800 MG tablet Take 1 tablet (800 mg total) by mouth every 8 (eight) hours as needed for moderate pain. 90 tablet 2   isosorbide mononitrate (IMDUR) 30 MG 24 hr tablet Take 0.5 tablets (15 mg total) by mouth daily. 45 tablet 3   lisinopril-hydrochlorothiazide (ZESTORETIC) 20-12.5 MG tablet Take 2 tablets by mouth every morning. 180 tablet 1   meclizine (ANTIVERT) 25 MG tablet Take 1 tablet (25 mg total) by  mouth 3 (three) times daily as needed for dizziness. 180 tablet 1   nitroGLYCERIN (NITROSTAT) 0.4 MG SL tablet Place 1 tablet (0.4 mg total) under the tongue every 5 (five) minutes as needed for chest pain. 25 tablet 3   omeprazole (PRILOSEC) 40 MG capsule Take 1 capsule (40 mg total) by mouth daily. 90 capsule 1   oxyCODONE-acetaminophen (PERCOCET) 10-325 MG tablet Take 1 tablet by mouth every 8 (eight) hours as needed for pain. 60 tablet 0   rosuvastatin (CRESTOR) 10 MG tablet Take 1 tablet (10 mg total) by mouth daily. 90 tablet 1   traZODone (DESYREL) 100 MG tablet Take 1 tablet (100 mg total) by mouth at bedtime. 90 tablet 1   No current facility-administered medications on file prior to visit.   Past Medical History:  Diagnosis Date   Anxiety    Aortic atherosclerosis ([place])    Ascending aorta dilation ([place])    a.) TTE 09/02/19 --> mild; measured 22m.   Asthma    CAD (coronary artery disease)    a,) CTA 08/24/20 --> mild CAD (25-49%) in RCA, LAD, LCx; CAC/Agatston score 509.   Cataract    Chronic left shoulder pain 09/27/2015   COPD (chronic obstructive pulmonary disease) (HCC)    Depression    Diastolic dysfunction    a.) TTE 09/02/19 --> LVEF 60-65%; G2DD. b.) TTE 07/23/20 --> LVEF 70-75%; G1DD.   GERD (gastroesophageal reflux disease)  History of kidney stones    Hyperlipidemia    Hypertension    Lupus (HCC)    Myocardial infarction ([place])    Osteoarthritis    PAC (premature atrial contraction)    a.) Holter 12/04/19 --> occassional; 1.1% PAC burden.   Pain of right hip joint 11/22/2015   Paraesophageal hernia    Presbyesophagus    Primary osteoarthritis of left knee 02/04/2015   PSVT (paroxysmal supraventricular tachycardia) ([place])    a.) Holter 12/04/19 --> 22 runs.   Schatzki's ring    SOB (shortness of breath)    T2DM (type 2 diabetes mellitus) ([place])    Past Surgical History:  Procedure Laterality Date   ABDOMINAL HYSTERECTOMY     BLADDER SUSPENSION      BREAST BIOPSY Right    COLONOSCOPY  09/04/2011   Colonic polyps, status post polyectomy. Incidental small ascending colon lipoma   ESOPHAGOGASTRODUODENOSCOPY  09/08/2015   Schatzki ring status post esophageal dilitation. Small hiatal hernia   hemorrhoid surgery     KNEE SURGERY     left   TOTAL KNEE ARTHROPLASTY Left 02/15/2015   Procedure: TOTAL KNEE ARTHROPLASTY;  Surgeon: Vickey Huger, MD;  Location: [facility];  Service: Orthopedics;  Laterality: Left;   TUBAL LIGATION     WRIST SURGERY     right   XI ROBOTIC ASSISTED PARAESOPHAGEAL HERNIA REPAIR N/A 02/22/2021   Procedure: XI ROBOTIC ASSISTED PARAESOPHAGEAL HERNIA REPAIR with RNFA to assist;  Surgeon: Jules Husbands, MD;  Location: [facility];  Service: General;  Laterality: N/A;    Family History  Problem Relation Age of Onset   Stroke Mother    Heart disease Mother    COPD Father    Cancer Father        Bone   Diabetes Father    Heart disease Father    Stroke Father    Lupus Sister    Seizures Son    COPD Son    Colon cancer Neg Hx    Esophageal cancer Neg Hx    Stomach cancer Neg Hx    Rectal cancer Neg Hx    Social History   Socioeconomic History   Marital status: Widowed    Spouse name: Not on file   Number of children: 4   Years of education: Not on file   Highest education level: Not on file  Occupational History   Occupation: Retired  Tobacco Use   Smoking status: Every Day    Packs/day: 1.00    Years: 47.00    Total pack years: 47.00    Types: Cigarettes   Smokeless tobacco: Never   Tobacco comments:    Smokes 1 pack daily 01/04/21 [facility]   Vaping Use   Vaping Use: Never used  Substance and Sexual Activity   Alcohol use: No   Drug use: No   Sexual activity: Not Currently  Other Topics Concern   Not on file  Social History Narrative   Not on file   Social Determinants of Health   Financial Resource Strain: Low Risk  (10/20/2021)   Overall Financial Resource Strain (CARDIA)    Difficulty of Paying  Living Expenses: Not hard at all  Food Insecurity: No Food Insecurity (09/03/2020)   Hunger Vital Sign    Worried About Running Out of Food in the Last Year: Never true    Ran Out of Food in the Last Year: Never true  Transportation Needs: Unmet Transportation Needs (03/01/2021)   [place] - Transportation  Lack of Transportation (Medical): Yes    Lack of Transportation (Non-Medical): Yes  Physical Activity: Insufficiently Active (10/12/2020)   Exercise Vital Sign    Days of Exercise per Week: 2 days    Minutes of Exercise per Session: 20 min  Stress: Stress Concern Present (10/12/2020)   [place]    Feeling of Stress : To some extent  Social Connections: Socially Isolated (10/12/2020)   Social Connection and Isolation Panel [NHANES]    Frequency of Communication with Friends and Family: Three times a week    Frequency of Social Gatherings with Friends and Family: Never    Attends Religious Services: Never    Marine scientist or Organizations: No    Attends Archivist Meetings: Never    Marital Status: Widowed    Review of Systems  Constitutional:  Negative for chills, fatigue and fever.  HENT:  Negative for congestion, ear pain and sore throat.   Eyes:  Negative for visual disturbance.  Respiratory:  Positive for cough. Negative for shortness of breath.   Cardiovascular:  Negative for chest pain and palpitations.  Gastrointestinal:  Negative for abdominal pain, constipation, diarrhea, nausea and vomiting.  Endocrine: Negative for polydipsia, polyphagia and polyuria.  Genitourinary:  Negative for difficulty urinating and dysuria.  Musculoskeletal:  Positive for arthralgias and back pain. Negative for myalgias.  Skin:  Negative for rash.  Neurological:  Negative for headaches.  Psychiatric/Behavioral:  Negative for dysphoric mood. The patient is not nervous/anxious.       02/14/2022    2:55 PM  11/03/2021   11:29 AM 08/11/2021   11:14 AM 05/05/2021    2:40 PM 01/18/2021    2:01 PM  Depression screen PHQ 2/9  Decreased Interest '1 1 1 1 2  '$ Down, Depressed, Hopeless '2 1 3 2 3  '$ PHQ - 2 Score '3 2 4 3 5  '$ Altered sleeping '2 3 3 1 3  '$ Tired, decreased energy '3 3 3 3 3  '$ Change in appetite '3 3 3 2 1  '$ Feeling bad or failure about yourself  2 0 3 1 0  Trouble concentrating '2 3 3 3 3  '$ Moving slowly or fidgety/restless '2 1 3 3 2  '$ Suicidal thoughts 0 0 0 0 0  PHQ-9 Score '17 15 22 16 17  '$ Difficult doing work/chores Somewhat difficult Not difficult at all Very difficult Somewhat difficult Somewhat difficult      Objective:  BP (!) 90/50   Pulse 77   Temp (!) 96.7 F (35.9 C)   Resp 16   Ht '5\' 2"'$  (1.575 m)   Wt 134 lb (60.8 kg)   SpO2 95%   BMI 24.51 kg/m      02/14/2022    2:21 PM 01/31/2022    8:23 AM 12/21/2021   11:07 AM  BP/Weight  Systolic BP 90 426 834  Diastolic BP 50 60 70  Wt. (Lbs) 134 136 139  BMI 24.51 kg/m2 24.87 kg/m2 25.42 kg/m2    Physical Exam Vitals reviewed.  Constitutional:      General: She is not in acute distress.    Appearance: Normal appearance.  HENT:     Head: Normocephalic and atraumatic.     Right Ear: Tympanic membrane normal.     Left Ear: Tympanic membrane normal.     Nose: Nose normal.     Mouth/Throat:     Mouth: Mucous membranes are moist.     Pharynx: Oropharynx  is clear.  Eyes:     Extraocular Movements: Extraocular movements intact.     Conjunctiva/sclera: Conjunctivae normal.     Pupils: Pupils are equal, round, and reactive to light.  Cardiovascular:     Rate and Rhythm: Normal rate and regular rhythm.     Pulses: Normal pulses.     Heart sounds: Normal heart sounds. No murmur heard.    No gallop.  Pulmonary:     Effort: Pulmonary effort is normal. No respiratory distress.     Breath sounds: Normal breath sounds. No wheezing.  Abdominal:     General: Abdomen is flat. Bowel sounds are normal. There is no distension.      Tenderness: There is no abdominal tenderness.  Musculoskeletal:        General: Normal range of motion.     Cervical back: Normal range of motion and neck supple.     Right lower leg: No edema.     Left lower leg: No edema.  Skin:    General: Skin is warm.     Capillary Refill: Capillary refill takes less than 2 seconds.  Neurological:     General: No focal deficit present.     Mental Status: She is alert and oriented to person, place, and time. Mental status is at baseline.     Gait: Gait normal.  Psychiatric:        Mood and Affect: Mood normal.        Thought Content: Thought content normal.     Diabetic Foot Exam - Simple   No data filed      Lab Results  Component Value Date   WBC 4.7 10/15/2021   HGB 12.8 10/15/2021   HCT 37.7 10/15/2021   PLT 174 10/15/2021   GLUCOSE 77 11/03/2021   CHOL 187 10/12/2021   TRIG 59 10/12/2021   HDL 73 10/12/2021   LDLCALC 103 (H) 10/12/2021   ALT 10 11/03/2021   AST 17 11/03/2021   NA 136 11/03/2021   K 4.5 11/03/2021   CL 97 11/03/2021   CREATININE 0.71 11/03/2021   BUN 9 11/03/2021   CO2 26 11/03/2021   TSH 1.530 11/03/2021   INR 1.02 01/28/2015   HGBA1C 5.3 10/12/2021  Narcotic countn #38    Assessment & Plan:   Problem List Items Addressed This Visit       Cardiovascular and Mediastinum   Essential hypertension - Primary   Relevant Orders   Comprehensive metabolic panel   CBC with Differential/Platelet An individual hypertension care plan was established and reinforced today.  The patient's status was assessed using clinical findings on exam and labs or diagnostic tests. The patient's success at meeting treatment goals on disease specific evidence-based guidelines and found to be well controlled. SELF MANAGEMENT: The patient and I together assessed ways to personally work towards obtaining the recommended goals. RECOMMENDATIONS: avoid decongestants found in common cold remedies, decrease consumption of alcohol,  perform routine monitoring of BP with home BP cuff, exercise, reduction of dietary salt, take medicines as prescribed, try not to miss doses and quit smoking.  Regular exercise and maintaining a healthy weight is needed.  Stress reduction may help. A CLINICAL SUMMARY including written plan identify barriers to care unique to individual due to social or financial issues.  We attempt to mutually creat solutions for individual and family understanding.     PAT (paroxysmal atrial tachycardia) ([place]) Patient has a diagnosis of paroxysmal atrial fibrillation.   Patient is on no anticoagulants  and has controlled ventricular response.  Patient is CV stable .    Coronary artery disease involving native coronary artery of native heart without angina pectoris   Relevant Orders   Ambulatory referral to Cardiology An individual plan was formulated based on patient history and exam, labs and evidence based data. Patient has not had recent angina or nitroglycerin use. continue present treatment.      Respiratory   COPD (chronic obstructive pulmonary disease) (HCC)   Relevant Medications   albuterol (PROVENTIL) (2.5 MG/3ML) 0.083% nebulizer solution An individualize plan was formulated for care of COPD.  Treatment is evidence based.  She will continue on inhalers, avoid smoking and smoke.  Regular exercise with help with dyspnea. Routine follow ups and medication compliance is needed.      Digestive   GERD with esophagitis Plan of care was formulated today.  She is doing well.  A plan of care was formulated using patient exam, tests and other sources to optimize care using evidence based information.  Recommend no smoking, no eating after supper, avoid fatty foods, elevate Head of bed, avoid tight fitting clothing.  Continue on omeprazole.      Endocrine   Type 2 diabetes mellitus with other specified complication ([place])   Relevant Orders   Hemoglobin A1c An individual care plan for diabetes was established  and reinforced today.  The patient's status was assessed using clinical findings on exam, labs and diagnostic testing. Patient success at meeting goals based on disease specific evidence-based guidelines and found to be good controlled. Medications were assessed and patient's understanding of the medical issues , including barriers were assessed. Recommend adherence to a diabetic diet, a graduated exercise program, HgbA1c level is checked quarterly, and urine microalbumin performed yearly .  Annual mono-filament sensation testing performed. Lower blood pressure and control hyperlipidemia is important. Get annual eye exams and annual flu shots and smoking cessation discussed.  Self management goals were discussed.      Other   Hyperlipidemia   Relevant Orders   Lipid panel AN INDIVIDUAL CARE PLAN for hyperlipidemia/ cholesterol was established and reinforced today.  The patient's status was assessed using clinical findings on exam, lab and other diagnostic tests. The patient's disease status was assessed based on evidence-based guidelines and found to be fair controlled. MEDICATIONS were reviewed. SELF MANAGEMENT GOALS have been discussed and patient's success at attaining the goal of low cholesterol was assessed. RECOMMENDATION given include regular exercise 3 days a week and low cholesterol/low fat diet. CLINICAL SUMMARY including written plan to identify barriers unique to the patient due to social or economic  reasons was discussed.    Depression, major, single episode, severe (HCC) Depression stable on diazepam and desryl    BMI 24.0-24.9, adult Continue diet and exercise  .       Follow-up: Return in about 4 months (around 06/16/2022).  An After Visit Summary was printed and given to the patient.  [name], MD [facility] [phone]

## 2022-02-15 ENCOUNTER — Telehealth: Payer: Self-pay

## 2022-02-15 LAB — COMPREHENSIVE METABOLIC PANEL
ALT: 13 IU/L (ref 0–32)
AST: 16 IU/L (ref 0–40)
Albumin/Globulin Ratio: 1.8 (ref 1.2–2.2)
Albumin: 4 g/dL (ref 3.8–4.8)
Alkaline Phosphatase: 79 IU/L (ref 44–121)
BUN/Creatinine Ratio: 13 (ref 12–28)
BUN: 9 mg/dL (ref 8–27)
Bilirubin Total: 0.5 mg/dL (ref 0.0–1.2)
CO2: 24 mmol/L (ref 20–29)
Calcium: 9.3 mg/dL (ref 8.7–10.3)
Chloride: 95 mmol/L — ABNORMAL LOW (ref 96–106)
Creatinine, Ser: 0.71 mg/dL (ref 0.57–1.00)
Globulin, Total: 2.2 g/dL (ref 1.5–4.5)
Glucose: 90 mg/dL (ref 70–99)
Potassium: 3.5 mmol/L (ref 3.5–5.2)
Sodium: 134 mmol/L (ref 134–144)
Total Protein: 6.2 g/dL (ref 6.0–8.5)
eGFR: 88 mL/min/{1.73_m2} (ref >59)

## 2022-02-15 LAB — CBC WITH DIFFERENTIAL/PLATELET
Basophils Absolute: 0 10*3/uL (ref 0.0–0.2)
Basos: 1 %
EOS (ABSOLUTE): 0 10*3/uL (ref 0.0–0.4)
Eos: 0 %
Hematocrit: 37.3 % (ref 34.0–46.6)
Hemoglobin: 12.7 g/dL (ref 11.1–15.9)
Immature Grans (Abs): 0 10*3/uL (ref 0.0–0.1)
Immature Granulocytes: 0 %
Lymphocytes Absolute: 1.9 10*3/uL (ref 0.7–3.1)
Lymphs: 30 %
MCH: 31.8 pg (ref 26.6–33.0)
MCHC: 34 g/dL (ref 31.5–35.7)
MCV: 93 fL (ref 79–97)
Monocytes Absolute: 0.5 10*3/uL (ref 0.1–0.9)
Monocytes: 8 %
Neutrophils Absolute: 3.7 10*3/uL (ref 1.4–7.0)
Neutrophils: 61 %
Platelets: 240 10*3/uL (ref 150–450)
RBC: 4 x10E6/uL (ref 3.77–5.28)
RDW: 12.5 % (ref 11.7–15.4)
WBC: 6.1 10*3/uL (ref 3.4–10.8)

## 2022-02-15 LAB — LIPID PANEL
Chol/HDL Ratio: 2.4 ratio (ref 0.0–4.4)
Cholesterol, Total: 164 mg/dL (ref 100–199)
HDL: 67 mg/dL (ref >39)
LDL Chol Calc (NIH): 87 mg/dL (ref 0–99)
Triglycerides: 45 mg/dL (ref 0–149)
VLDL Cholesterol Cal: 10 mg/dL (ref 5–40)

## 2022-02-15 LAB — CARDIOVASCULAR RISK ASSESSMENT

## 2022-02-15 LAB — HEMOGLOBIN A1C
Est. average glucose Bld gHb Est-mCnc: 103 mg/dL
Hgb A1c MFr Bld: 5.2 % (ref 4.8–5.6)

## 2022-02-15 NOTE — Progress Notes (Signed)
Kidney and liver tests normal, Cholesterol normal, CBC normal, A1c 5.3,  lp

## 2022-02-15 NOTE — Progress Notes (Signed)
Chronic Care Management Pharmacy Assistant   Name: [name] [name]  MRN: [id number] DOB: December 03, 1945   Reason for Encounter: Medication Coordination for [place]     Recent office visits:  02/14/22 [name] MD. Seen for routine visit. Referral to Cardiology. Started on Albuterol Nebulizer 2.'5mg'$ .   02/07/22 [name] MD. Seen for Covid Positive. Started on PAXLOVID.  01/31/22 [name] MD. Seen for lumbar spondylosis. Referral to Neurology. Started on Oxycodone-APAP 10-'325mg'$ .   Recent consult visits:  None  Hospital visits:  None  Medications: Outpatient Encounter Medications as of 02/15/2022  Medication Sig   albuterol (PROVENTIL) (2.5 MG/3ML) 0.083% nebulizer solution Take 3 mLs (2.5 mg total) by nebulization every 6 (six) hours as needed for wheezing or shortness of breath.   aspirin EC 81 MG tablet Take 1 tablet (81 mg total) by mouth daily. Swallow whole.   Budeson-Glycopyrrol-Formoterol 160-9-4.8 MCG/ACT AERO Inhale 2 puffs into the lungs 2 (two) times daily as needed (shortness of breath).   buPROPion (WELLBUTRIN XL) 150 MG 24 hr tablet TAKE 1 TABLET(150 MG) BY MOUTH DAILY   cloNIDine (CATAPRES) 0.1 MG tablet Take 1 tablet (0.1 mg total) by mouth 2 (two) times daily.   diazepam (VALIUM) 5 MG tablet TAKE 1 TABLET BY MOUTH EVERY 12 HOURS AS NEEDED FOR ANXIETY. WATCH FOR SEDATION   ibuprofen (ADVIL) 800 MG tablet Take 1 tablet (800 mg total) by mouth every 8 (eight) hours as needed for moderate pain.   isosorbide mononitrate (IMDUR) 30 MG 24 hr tablet Take 0.5 tablets (15 mg total) by mouth daily.   lisinopril-hydrochlorothiazide (ZESTORETIC) 20-12.5 MG tablet Take 2 tablets by mouth every morning.   meclizine (ANTIVERT) 25 MG tablet Take 1 tablet (25 mg total) by mouth 3 (three) times daily as needed for dizziness.   nitroGLYCERIN (NITROSTAT) 0.4 MG SL tablet Place 1 tablet (0.4 mg total) under the tongue every 5 (five) minutes as needed for chest pain.    omeprazole (PRILOSEC) 40 MG capsule Take 1 capsule (40 mg total) by mouth daily.   oxyCODONE-acetaminophen (PERCOCET) 10-325 MG tablet Take 1 tablet by mouth every 8 (eight) hours as needed for pain.   rosuvastatin (CRESTOR) 10 MG tablet Take 1 tablet (10 mg total) by mouth daily.   traZODone (DESYREL) 100 MG tablet Take 1 tablet (100 mg total) by mouth at bedtime.   No facility-administered encounter medications on file as of 02/15/2022.    Reviewed chart for medication changes ahead of medication coordination call.  No Consults, or hospital visits since last care coordination call/Pharmacist visit.   BP Readings from Last 3 Encounters:  02/14/22 (!) 90/50  01/31/22 130/60  12/21/21 124/70    Lab Results  Component Value Date   HGBA1C 5.2 02/14/2022     Patient obtains medications through Adherence Packaging  30 Days   Last adherence delivery included:  Isosorbide Monoitrate 30 mg 0.5 tablet at Breakfast  Rosuvastatin 10 mg 1 at bedtime Clonidine 0.1 mg 1 at breakfast and 1 at Bedtime Bupropion XL 150 mg 1 at breakfast Trazodone 100 mg 1 at bedtime prn (Vials) Lisinopril-HCTZ 20-12.5 mg 2 at breakfast Omeprazole 40 mg 1 at breakfast  Meclizine '25mg'$ - 1 tablet 3 times daily as needed  Ibuprofen 800 mg every 8 hours as needed  Patient declined (meds) last month Diazepam cant be filled until 02/01/22- Future fill set Aspirin '81mg'$ - Buys OTC Albuterol- plenty of supply Nitroglycerin- plenty of supply Breztri Inhaler- Pt still has 2 inhalers on hand,  uses prn  Patient is due for next adherence delivery on: 02/27/22. Called patient and reviewed medications and coordinated delivery.  This delivery to include: Isosorbide Monoitrate 30 mg 0.5 tablet at Breakfast  Rosuvastatin 10 mg 1 at bedtime Clonidine 0.1 mg 1 at breakfast and 1 at Bedtime Bupropion XL 150 mg 1 at breakfast Trazodone 100 mg 1 at bedtime prn (Vials) Lisinopril-HCTZ 20-12.5 mg 2 at breakfast Omeprazole 40 mg 1  at breakfast  Meclizine '25mg'$ - 1 tablet 3 times daily as needed  Ibuprofen 800 mg every 8 hours as needed  Patient declined the following medications  Unable to get a hold pf pt to complete this call   Patient needs refills-Request Sent  Meclizine '25mg'$   Delivery not confirmed, unable to reach pt   Elray Mcgregor, [place] Pharmacist Assistant  [phone]

## 2022-02-16 ENCOUNTER — Telehealth: Payer: Self-pay

## 2022-02-16 NOTE — Patient Outreach (Signed)
  Care Coordination   Initial Visit Note   02/16/2022 Name: LAWONDA PRETLOW MRN: [id number] DOB: 1945/10/22  Connye Burkitt Bidinger is a 76 y.o. year old female who sees Cox, Kirsten, MD for primary care. I spoke with  Connye Burkitt Frary by phone today.  What matters to the patients health and wellness today?  Placed call to patient today to offer [facility] care coordination program. Patient has consented and appointment scheduled.    SDOH assessments and interventions completed:  No     Care Coordination Interventions Activated:  Yes  Care Coordination Interventions:  Yes, provided   Follow up plan: Follow up call scheduled for 02/20/2022    Encounter Outcome:  Pt. Visit Completed

## 2022-02-17 DIAGNOSIS — M5126 Other intervertebral disc displacement, lumbar region: Secondary | ICD-10-CM | POA: Diagnosis not present

## 2022-02-17 DIAGNOSIS — M47816 Spondylosis without myelopathy or radiculopathy, lumbar region: Secondary | ICD-10-CM | POA: Diagnosis not present

## 2022-02-20 ENCOUNTER — Ambulatory Visit: Payer: Self-pay

## 2022-02-20 ENCOUNTER — Ambulatory Visit: Payer: Self-pay | Admitting: Licensed Clinical Social Worker

## 2022-02-20 ENCOUNTER — Other Ambulatory Visit: Payer: Self-pay

## 2022-02-20 DIAGNOSIS — R42 Dizziness and giddiness: Secondary | ICD-10-CM

## 2022-02-20 MED ORDER — MECLIZINE HCL 25 MG PO TABS: 25.0000 mg | ORAL_TABLET | Freq: Three times a day (TID) | ORAL | 1 refills | Status: DC | PRN

## 2022-02-20 NOTE — Patient Outreach (Signed)
  Care Coordination   Initial Visit Note   02/20/2022 Name: [name] [name] MRN: [id number] DOB: Jan 10, 1946  [name] Burkitt Kagan is a 76 y.o. year old female who sees Cox, Kirsten, MD for primary care. I spoke with  [name] [name] by phone today.  What matters to the patients health and wellness today?  I have a lot of falls.  Pending MRI results.   Goals Addressed               This Visit's Progress     Patient wants to reduce falls (pt-stated)        Care Coordination Interventions: Reviewed medications and discussed potential side effects of medications such as dizziness and frequent urination Advised patient of importance of notifying provider of falls Assessed for falls since last encounter Assessed patients knowledge of fall risk prevention secondary to previously provided education Assessed social determinant of health barriers Reviewed with patient she has had 20 plus falls in 1 year.    Reviewed with patient that she does not have throw rugs, cords on the floor, clutter.   Reviewed PT options and patient declines.  Patient reports that she thinks she is falling due to back issues. Reports she had a MRI last week and is waiting for results.  Reports problems getting up and down off the sofa. Reviewed home exercises and she reports she does exercises at home on her own.  Reviewed with patient that she uses a rolling walker.           SDOH assessments and interventions completed:  Yes  SDOH Interventions Today    Flowsheet Row Most Recent Value  SDOH Interventions   Food Insecurity Interventions Intervention Not Indicated  Housing Interventions Other (Comment)  [reports that she is having problems with lock and filters need changing]  Transportation Interventions Other (Comment)  [referral to [facility] social worker]  Utilities Interventions Intervention Not Indicated        Care Coordination Interventions Activated:  Yes  Care Coordination Interventions:  Yes, provided    Follow up plan: Follow up call scheduled for 03/06/2022    Encounter Outcome:  Pt. Visit Completed   Tomasa Rand, RN, BSN, CEN [place] Coordinator [phone]

## 2022-02-20 NOTE — Patient Outreach (Signed)
  Care Coordination   Initial Visit Note   02/20/2022 Name: VIBHA FERDIG MRN: [id number] DOB: 08/03/1945  Connye Burkitt Craigie is a 76 y.o. year old female who sees Cox, Kirsten, MD for primary care. I spoke with  Connye Burkitt Banke by phone today.  What matters to the patients health and wellness today?  Housing and Transportation   Goals Addressed               This [place] (pt-stated)        Patient stated she needed new filters for home and her door isn't locking properly. Patient agreed to contact maintenance on tomorrow to request additional filters that she can change herself monthly. Patient stated maintenance will be at resident tomorrow to fix door.   Patient stated she is having difficulty with transportation through [facility]. Patient stated she has secured transportation via her neighbors for upcoming appointments. Patient denied putting in any request for transportation via [place]. SW advised if she does in the future, to make SW aware. SW will assist with contacting [place].  Care Coordination Interventions: Assessed social determinant of health barriers  Problem Solving /Task Center strategies reviewed         SDOH assessments and interventions completed:  Yes     Care Coordination Interventions Activated:  Yes  Care Coordination Interventions:  Yes, provided   Follow up plan: No further intervention required.   Encounter Outcome:  Pt. Visit Completed   Lenor Derrick, MSW  Social Worker [facility] Care Management  [phone]

## 2022-02-21 DIAGNOSIS — I471 Supraventricular tachycardia: Secondary | ICD-10-CM | POA: Insufficient documentation

## 2022-02-21 DIAGNOSIS — M329 Systemic lupus erythematosus, unspecified: Secondary | ICD-10-CM | POA: Insufficient documentation

## 2022-02-21 DIAGNOSIS — K449 Diaphragmatic hernia without obstruction or gangrene: Secondary | ICD-10-CM | POA: Insufficient documentation

## 2022-02-21 DIAGNOSIS — K219 Gastro-esophageal reflux disease without esophagitis: Secondary | ICD-10-CM | POA: Insufficient documentation

## 2022-02-21 DIAGNOSIS — E1159 Type 2 diabetes mellitus with other circulatory complications: Secondary | ICD-10-CM

## 2022-02-21 DIAGNOSIS — J45909 Unspecified asthma, uncomplicated: Secondary | ICD-10-CM | POA: Insufficient documentation

## 2022-02-21 DIAGNOSIS — K222 Esophageal obstruction: Secondary | ICD-10-CM | POA: Insufficient documentation

## 2022-02-21 DIAGNOSIS — K2289 Other specified disease of esophagus: Secondary | ICD-10-CM | POA: Insufficient documentation

## 2022-02-21 DIAGNOSIS — I219 Acute myocardial infarction, unspecified: Secondary | ICD-10-CM | POA: Insufficient documentation

## 2022-02-21 DIAGNOSIS — I10 Essential (primary) hypertension: Secondary | ICD-10-CM | POA: Insufficient documentation

## 2022-02-21 DIAGNOSIS — I77810 Thoracic aortic ectasia: Secondary | ICD-10-CM | POA: Insufficient documentation

## 2022-02-21 DIAGNOSIS — F32A Depression, unspecified: Secondary | ICD-10-CM | POA: Insufficient documentation

## 2022-02-21 DIAGNOSIS — I152 Hypertension secondary to endocrine disorders: Secondary | ICD-10-CM | POA: Insufficient documentation

## 2022-02-21 DIAGNOSIS — Z87442 Personal history of urinary calculi: Secondary | ICD-10-CM | POA: Insufficient documentation

## 2022-02-21 DIAGNOSIS — I700 Atherosclerosis of aorta: Secondary | ICD-10-CM | POA: Insufficient documentation

## 2022-02-21 DIAGNOSIS — F419 Anxiety disorder, unspecified: Secondary | ICD-10-CM | POA: Insufficient documentation

## 2022-02-21 DIAGNOSIS — I2510 Atherosclerotic heart disease of native coronary artery without angina pectoris: Secondary | ICD-10-CM | POA: Insufficient documentation

## 2022-02-21 HISTORY — DX: Type 2 diabetes mellitus with other circulatory complications: E11.59

## 2022-02-22 ENCOUNTER — Encounter: Payer: Self-pay | Admitting: Cardiology

## 2022-02-22 ENCOUNTER — Ambulatory Visit: Payer: Medicare Other | Attending: Cardiology | Admitting: Cardiology

## 2022-02-22 VITALS — BP 124/60 | HR 63 | Ht 62.0 in | Wt 138.0 lb

## 2022-02-22 DIAGNOSIS — E1169 Type 2 diabetes mellitus with other specified complication: Secondary | ICD-10-CM

## 2022-02-22 DIAGNOSIS — E782 Mixed hyperlipidemia: Secondary | ICD-10-CM

## 2022-02-22 DIAGNOSIS — I700 Atherosclerosis of aorta: Secondary | ICD-10-CM

## 2022-02-22 DIAGNOSIS — F17210 Nicotine dependence, cigarettes, uncomplicated: Secondary | ICD-10-CM

## 2022-02-22 DIAGNOSIS — I10 Essential (primary) hypertension: Secondary | ICD-10-CM | POA: Diagnosis not present

## 2022-02-22 DIAGNOSIS — I2510 Atherosclerotic heart disease of native coronary artery without angina pectoris: Secondary | ICD-10-CM | POA: Diagnosis not present

## 2022-02-22 HISTORY — DX: Nicotine dependence, cigarettes, uncomplicated: F17.210

## 2022-02-22 NOTE — Patient Instructions (Signed)

## 2022-02-22 NOTE — Progress Notes (Signed)
Cardiology Office Note:    Date:  02/22/2022   ID:  [name] [name], [name] [name] 11-25-1945, MRN [id number]  PCP:  Rochel Brome, MD  Cardiologist:  Jenean Lindau, MD   Referring MD: Lillard Anes,*    ASSESSMENT:    1. Coronary artery disease involving native coronary artery of native heart without angina pectoris   2. Aortic atherosclerosis ([place])   3. Essential hypertension   4. Mixed hyperlipidemia   5. Type 2 diabetes mellitus with other specified complication, without long-term current use of insulin ([place])   6. Cigarette smoker    PLAN:    In order of problems listed above:  Coronary artery disease: Secondary prevention stressed with the patient.  Importance of compliance with diet medication stressed and she vocalized understanding. Essential hypertension: Blood pressure is stable and diet was emphasized. Mixed dyslipidemia: On lipid-lowering medications.  Lipids followed by primary care.  I reviewed recent lipids and they are fine. Cigarette smoker: I spent 5 minutes with the patient discussing solely about smoking. Smoking cessation was counseled. I suggested to the patient also different medications and pharmacological interventions. Patient is keen to try stopping on its own at this time. He will get back to me if he needs any further assistance in this matter. Patient will be seen in follow-up appointment in 6 months or earlier if the patient has any concerns    Medication Adjustments/Labs and Tests Ordered: Current medicines are reviewed at length with the patient today.  Concerns regarding medicines are outlined above.  Orders Placed This Encounter  Procedures   EKG 12-Lead   No orders of the defined types were placed in this encounter.    No chief complaint on file.    History of Present Illness:    [name] [name] is a 76 y.o. female.  Patient has past medical history of coronary artery disease, aortic atherosclerosis, essential hypertension, dyslipidemia  and unfortunately continues to smoke.  She is to follow-up with my partner who has moved to [place].  Therefore she is here for appointment.  She denies any chest pain orthopnea or PND.  She takes care of activities of daily living.  She ambulates with a cane.  At the time of my evaluation, the patient is alert awake oriented and in no distress.  Past Medical History:  Diagnosis Date   Anxiety    Aortic atherosclerosis ([place])    Ascending aorta dilation ([place])    a.) TTE 09/02/19 --> mild; measured 29m.   Asthma    BMI 24.0-24.9, adult 02/14/2022   CAD (coronary artery disease)    a,) CTA 08/24/20 --> mild CAD (25-49%) in RCA, LAD, LCx; CAC/Agatston score 509.   Cataract    Chronic left shoulder pain 09/27/2015   COPD (chronic obstructive pulmonary disease) (HCC)    Coronary artery disease involving native coronary artery of native heart without angina pectoris 11/18/2020   Degenerative joint disease involving multiple joints 09/28/2017   Depression    Depression, major, single episode, severe ([facility] 197/01/8920  Diastolic dysfunction    a.) TTE 09/02/19 --> LVEF 60-65%; G2DD. b.) TTE 07/23/20 --> LVEF 70-75%; G1DD.   Enlarged aorta (HCC)    Essential hypertension 08/07/2019   GERD (gastroesophageal reflux disease)    GERD with esophagitis 08/07/2019   History of kidney stones    Hyperlipidemia    Hypertension    Long-term current use of opiate analgesic 06/25/2017   Lupus ([facility]    Myocardial infarction ([facility]  Osteoarthritis    PAC (premature atrial contraction)    a.) Holter 12/04/19 --> occassional; 1.1% PAC burden.   Pain of right hip joint 11/22/2015   Paraesophageal hernia    PAT (paroxysmal atrial tachycardia) ([place]) 02/03/2020   Presbyesophagus    Primary osteoarthritis of left knee 02/04/2015   PSVT (paroxysmal supraventricular tachycardia) ([place])    a.) Holter 12/04/19 --> 22 runs.   Renal calculus 12/21/2021   S/P repair of paraesophageal hernia 02/22/2021   S/P total knee  arthroplasty 02/15/2015   Schatzki's ring    Sciatica 01/31/2022   Tobacco use 08/19/2019   Type 2 diabetes mellitus with other specified complication ([place]) 0/81/4481    Past Surgical History:  Procedure Laterality Date   ABDOMINAL HYSTERECTOMY     BLADDER SUSPENSION     BREAST BIOPSY Right    COLONOSCOPY  09/04/2011   Colonic polyps, status post polyectomy. Incidental small ascending colon lipoma   ESOPHAGOGASTRODUODENOSCOPY  09/08/2015   Schatzki ring status post esophageal dilitation. Small hiatal hernia   hemorrhoid surgery     KNEE SURGERY     left   TOTAL KNEE ARTHROPLASTY Left 02/15/2015   Procedure: TOTAL KNEE ARTHROPLASTY;  Surgeon: Vickey Huger, MD;  Location: [facility];  Service: Orthopedics;  Laterality: Left;   TUBAL LIGATION     WRIST SURGERY     right   XI ROBOTIC ASSISTED PARAESOPHAGEAL HERNIA REPAIR N/A 02/22/2021   Procedure: XI ROBOTIC ASSISTED PARAESOPHAGEAL HERNIA REPAIR with RNFA to assist;  Surgeon: Jules Husbands, MD;  Location: [facility];  Service: General;  Laterality: N/A;    Current Medications: Current Meds  Medication Sig   albuterol (PROVENTIL) (2.5 MG/3ML) 0.083% nebulizer solution Take 3 mLs (2.5 mg total) by nebulization every 6 (six) hours as needed for wheezing or shortness of breath.   aspirin EC 81 MG tablet Take 1 tablet (81 mg total) by mouth daily. Swallow whole.   Budeson-Glycopyrrol-Formoterol 160-9-4.8 MCG/ACT AERO Inhale 2 puffs into the lungs 2 (two) times daily as needed (shortness of breath).   buPROPion (WELLBUTRIN XL) 150 MG 24 hr tablet TAKE 1 TABLET(150 MG) BY MOUTH DAILY   cloNIDine (CATAPRES) 0.1 MG tablet Take 1 tablet (0.1 mg total) by mouth 2 (two) times daily.   diazepam (VALIUM) 5 MG tablet TAKE 1 TABLET BY MOUTH EVERY 12 HOURS AS NEEDED FOR ANXIETY. WATCH FOR SEDATION   ibuprofen (ADVIL) 800 MG tablet Take 1 tablet (800 mg total) by mouth every 8 (eight) hours as needed for moderate pain.   isosorbide mononitrate (IMDUR) 30 MG 24  hr tablet Take 0.5 tablets (15 mg total) by mouth daily.   lisinopril-hydrochlorothiazide (ZESTORETIC) 20-12.5 MG tablet Take 2 tablets by mouth every morning.   meclizine (ANTIVERT) 25 MG tablet Take 1 tablet (25 mg total) by mouth 3 (three) times daily as needed for dizziness.   nitroGLYCERIN (NITROSTAT) 0.4 MG SL tablet Place 0.4 mg under the tongue every 5 (five) minutes as needed for chest pain.   omeprazole (PRILOSEC) 40 MG capsule Take 1 capsule (40 mg total) by mouth daily.   oxyCODONE-acetaminophen (PERCOCET) 10-325 MG tablet Take 1 tablet by mouth every 8 (eight) hours as needed for pain.   rosuvastatin (CRESTOR) 10 MG tablet Take 1 tablet (10 mg total) by mouth daily.   traZODone (DESYREL) 100 MG tablet Take 1 tablet (100 mg total) by mouth at bedtime.     Allergies:   Meperidine, Meperidine hcl, Influenza vaccines, and Other   Social History  Socioeconomic History   Marital status: Widowed    Spouse name: Not on file   Number of children: 4   Years of education: Not on file   Highest education level: Not on file  Occupational History   Occupation: Retired  Tobacco Use   Smoking status: Every Day    Packs/day: 1.00    Years: 47.00    Total pack years: 47.00    Types: Cigarettes   Smokeless tobacco: Never   Tobacco comments:    Smokes 1 pack daily 01/04/21 [facility]   Vaping Use   Vaping Use: Never used  Substance and Sexual Activity   Alcohol use: No   Drug use: No   Sexual activity: Not Currently  Other Topics Concern   Not on file  Social History Narrative   Not on file   Social Determinants of Health   Financial Resource Strain: Low Risk  (10/20/2021)   Overall Financial Resource Strain (CARDIA)    Difficulty of Paying Living Expenses: Not hard at all  Food Insecurity: No Food Insecurity (02/20/2022)   Hunger Vital Sign    Worried About Running Out of Food in the Last Year: Never true    [place] in the Last Year: Never true  Transportation Needs: Unmet  Transportation Needs (02/20/2022)   PRAPARE - Transportation    Lack of Transportation (Medical): Yes    Lack of Transportation (Non-Medical): Yes  Physical Activity: Insufficiently Active (10/12/2020)   Exercise Vital Sign    Days of Exercise per Week: 2 days    Minutes of Exercise per Session: 20 min  Stress: Stress Concern Present (10/12/2020)   [place]    Feeling of Stress : To some extent  Social Connections: Socially Isolated (10/12/2020)   Social Connection and Isolation Panel [NHANES]    Frequency of Communication with Friends and Family: Three times a week    Frequency of Social Gatherings with Friends and Family: Never    Attends Religious Services: Never    Marine scientist or Organizations: No    Attends Archivist Meetings: Never    Marital Status: Widowed     Family History: The patient's family history includes COPD in her father and son; Cancer in her father; Diabetes in her father; Heart disease in her father and mother; Lupus in her sister; Seizures in her son; Stroke in her father and mother. There is no history of Colon cancer, Esophageal cancer, Stomach cancer, or Rectal cancer.  ROS:   Please see the history of present illness.    All other systems reviewed and are negative.  EKGs/Labs/Other Studies Reviewed:    The following studies were reviewed today: I discussed my findings with the patient at length EKG reveals sinus rhythm and nonspecific ST changes   Recent Labs: 02/23/2021: Magnesium 2.1 11/03/2021: TSH 1.530 02/14/2022: ALT 13; BUN 9; Creatinine, Ser 0.71; Hemoglobin 12.7; Platelets 240; Potassium 3.5; Sodium 134  Recent Lipid Panel    Component Value Date/Time   CHOL 164 02/14/2022 1520   TRIG 45 02/14/2022 1520   HDL 67 02/14/2022 1520   CHOLHDL 2.4 02/14/2022 1520   LDLCALC 87 02/14/2022 1520    Physical Exam:    VS:  BP 124/60   Pulse 63   Ht '5\' 2"'$  (1.575  m)   Wt 138 lb (62.6 kg)   SpO2 98%   BMI 25.24 kg/m     Wt Readings from Last  3 Encounters:  02/22/22 138 lb (62.6 kg)  02/14/22 134 lb (60.8 kg)  01/31/22 136 lb (61.7 kg)     GEN: Patient is in no acute distress HEENT: Normal NECK: No JVD; No carotid bruits LYMPHATICS: No lymphadenopathy CARDIAC: Hear sounds regular, 2/6 systolic murmur at the apex. RESPIRATORY:  Clear to auscultation without rales, wheezing or rhonchi  ABDOMEN: Soft, non-tender, non-distended MUSCULOSKELETAL:  No edema; No deformity  SKIN: Warm and dry NEUROLOGIC:  Alert and oriented x 3 PSYCHIATRIC:  Normal affect   Signed, Jenean Lindau, MD  02/22/2022 3:37 PM    [facility]

## 2022-02-24 ENCOUNTER — Other Ambulatory Visit: Payer: Self-pay

## 2022-02-24 DIAGNOSIS — M47816 Spondylosis without myelopathy or radiculopathy, lumbar region: Secondary | ICD-10-CM

## 2022-02-27 ENCOUNTER — Telehealth: Payer: Self-pay

## 2022-02-27 DIAGNOSIS — M47816 Spondylosis without myelopathy or radiculopathy, lumbar region: Secondary | ICD-10-CM

## 2022-02-27 DIAGNOSIS — M5136 Other intervertebral disc degeneration, lumbar region: Secondary | ICD-10-CM

## 2022-02-27 NOTE — Telephone Encounter (Signed)
Patient was informed about the MRI results per Dr Tobie Poet note. Numerous levels with bulging disc. I notified patient that I put the order for neurosurgery for her back. She was agreed. Order is in.

## 2022-03-03 DIAGNOSIS — M5416 Radiculopathy, lumbar region: Secondary | ICD-10-CM | POA: Diagnosis not present

## 2022-03-03 DIAGNOSIS — M4712 Other spondylosis with myelopathy, cervical region: Secondary | ICD-10-CM | POA: Diagnosis not present

## 2022-03-06 ENCOUNTER — Ambulatory Visit: Payer: Self-pay

## 2022-03-06 NOTE — Patient Outreach (Signed)
  Care Coordination   Follow Up Visit Note   03/06/2022 Name: [name] [name] MRN: [id number] DOB: [birth year]  [name] [name] is a 76 y.o. year old female who sees Cox, Kirsten, MD for primary care. I spoke with  [name] [name] by phone today.  What matters to the patients health and wellness today?  Patient reports that she had transportation arranged with [employer] and they did not show up.  Patient reports that her back is messed up bad. Has an appointment with [address] in [place] on 03/14/2022 at 2:30 pm.  Patient thinks that her grandaughter will probably take her.  Pain level today 10/10. Reports laying in bed.    Goals Addressed               This Visit's Progress     Patient wants to reduce falls (pt-stated)        Care Coordination Interventions: Reports that she may need help getting to her appointment. Reports she will call me back to let me know.  Reviewed with patient that she spoke with Milus Height 2 weeks ago.   No recent falls since last encounter Reports taking ibuprofen for pain.  No tylenol.  Uses heat and ice.  Reviewed patients concern about her MRI results.  Reviewed fall prevention. Reviewed use of walker. Provided [place] phone number and encouraged patient to call.          SDOH assessments and interventions completed:  No     Care Coordination Interventions Activated:  Yes  Care Coordination Interventions:  Yes, provided   Follow up plan: Follow up call scheduled for 03/23/2022    Encounter Outcome:  Pt. Visit Completed   Tomasa Rand, RN, BSN, CEN [place] Coordinator [phone]

## 2022-03-14 ENCOUNTER — Other Ambulatory Visit: Payer: Self-pay | Admitting: Legal Medicine

## 2022-03-14 ENCOUNTER — Other Ambulatory Visit: Payer: Self-pay | Admitting: Family Medicine

## 2022-03-14 DIAGNOSIS — I10 Essential (primary) hypertension: Secondary | ICD-10-CM

## 2022-03-14 DIAGNOSIS — F322 Major depressive disorder, single episode, severe without psychotic features: Secondary | ICD-10-CM

## 2022-03-14 DIAGNOSIS — M5416 Radiculopathy, lumbar region: Secondary | ICD-10-CM | POA: Diagnosis not present

## 2022-03-15 ENCOUNTER — Other Ambulatory Visit: Payer: Self-pay | Admitting: Cardiology

## 2022-03-15 ENCOUNTER — Ambulatory Visit (INDEPENDENT_AMBULATORY_CARE_PROVIDER_SITE_OTHER): Payer: Medicare Other

## 2022-03-15 DIAGNOSIS — E1169 Type 2 diabetes mellitus with other specified complication: Secondary | ICD-10-CM

## 2022-03-15 DIAGNOSIS — F322 Major depressive disorder, single episode, severe without psychotic features: Secondary | ICD-10-CM

## 2022-03-15 DIAGNOSIS — E782 Mixed hyperlipidemia: Secondary | ICD-10-CM

## 2022-03-15 NOTE — Progress Notes (Signed)
Chronic Care Management Pharmacy Note  03/15/2022 Name:  [name] [name] MRN:  [id number] DOB:  1946/04/21   Plan Recommendations:  Please consider rechecking vitamin D with next labs. Patient states she completed high dose vitamin D regimen but has not continued to supplement.  Recommend DEXA scan as well Counseled patient on getting new BP cuff so she could start testing. Monitor broke Mental health still uncontrolled. Recommend additional therapy  Subjective: [name] [name] is an 76 y.o. year old female who is a primary patient of Cox, Kirsten, MD.  The CCM team was consulted for assistance with disease management and care coordination needs.    Engaged with patient by telephone for follow up visit in response to provider referral for pharmacy case management and/or care coordination services.   Consent to Services:  The patient was given the following information about Chronic Care Management services today, agreed to services, and gave verbal consent: 1. CCM service includes personalized support from designated clinical staff supervised by the primary care provider, including individualized plan of care and coordination with other care providers 2. 24/7 contact phone numbers for assistance for urgent and routine care needs. 3. Service will only be billed when office clinical staff spend 20 minutes or more in a month to coordinate care. 4. Only one practitioner may furnish and bill the service in a calendar month. 5.The patient may stop CCM services at any time (effective at the end of the month) by phone call to the office staff. 6. The patient will be responsible for cost sharing (co-pay) of up to 20% of the service fee (after annual deductible is met). Patient agreed to services and consent obtained.  Patient Care Team: Rochel Brome, MD as PCP - General (Internal Medicine) Berniece Salines, DO as PCP - Cardiology (Cardiology) Lane Hacker, [facility] as Pharmacist (Pharmacist) Thana Ates,  RN as [place] [facility]  Recent office visits:  02/14/22 [name] MD. Seen for routine visit. Referral to Cardiology. Started on Albuterol Nebulizer 2.11m.    02/07/22 PReinaldo MeekerMD. Seen for Covid Positive. Started on PAXLOVID.   01/31/22 PReinaldo MeekerMD. Seen for lumbar spondylosis. Referral to Neurology. Started on Oxycodone-APAP 10-322m    Recent consult visits:  None   Hospital visits:  None    Objective:  Lab Results  Component Value Date   CREATININE 0.71 02/14/2022   BUN 9 02/14/2022   GFRNONAA >60 10/15/2021   GFRAA 100 07/14/2020   NA 134 02/14/2022   K 3.5 02/14/2022   CALCIUM 9.3 02/14/2022   CO2 24 02/14/2022   GLUCOSE 90 02/14/2022    Lab Results  Component Value Date/Time   HGBA1C 5.2 02/14/2022 03:20 PM   HGBA1C 5.3 10/12/2021 04:57 PM    Last diabetic Eye exam: No results found for: "HMDIABEYEEXA"  Last diabetic Foot exam: No results found for: "HMDIABFOOTEX"   Lab Results  Component Value Date   CHOL 164 02/14/2022   HDL 67 02/14/2022   LDLCALC 87 02/14/2022   TRIG 45 02/14/2022   CHOLHDL 2.4 02/14/2022       Latest Ref Rng & Units 02/14/2022    3:20 PM 11/03/2021    1:55 PM 10/15/2021   10:25 AM  Hepatic Function  Total Protein 6.0 - 8.5 g/dL 6.2  6.7  6.0   Albumin 3.8 - 4.8 g/dL 4.0  4.4  3.5   AST 0 - 40 IU/L 16  17  18    ALT 0 -  32 IU/L 13  10  12    Alk Phosphatase 44 - 121 IU/L 79  74  52   Total Bilirubin 0.0 - 1.2 mg/dL 0.5  0.5  0.5     Lab Results  Component Value Date/Time   TSH 1.530 11/03/2021 01:57 PM       Latest Ref Rng & Units 02/14/2022    3:20 PM 10/15/2021   10:25 AM 10/12/2021    4:57 PM  CBC  WBC 3.4 - 10.8 x10E3/uL 6.1  4.7  6.2   Hemoglobin 11.1 - 15.9 g/dL 12.7  12.8  13.1   Hematocrit 34.0 - 46.6 % 37.3  37.7  38.4   Platelets 150 - 450 x10E3/uL 240  174  205     Lab Results  Component Value Date/Time   VD25OH 17.3 (L) 12/10/2019 03:34 PM    Clinical ASCVD: No   The ASCVD Risk score (Arnett DK, et al., 2019) failed to calculate for the following reasons:   The patient has a prior MI or stroke diagnosis       02/14/2022    2:55 PM 11/03/2021   11:29 AM 08/11/2021   11:14 AM  Depression screen PHQ 2/9  Decreased Interest 1 1 1   Down, Depressed, Hopeless 2 1 3   PHQ - 2 Score 3 2 4   Altered sleeping 2 3 3   Tired, decreased energy 3 3 3   Change in appetite 3 3 3   Feeling bad or failure about yourself  2 0 3  Trouble concentrating 2 3 3   Moving slowly or fidgety/restless 2 1 3   Suicidal thoughts 0 0 0  PHQ-9 Score 17 15 22   Difficult doing work/chores Somewhat difficult Not difficult at all Very difficult     Social History   Tobacco Use  Smoking Status Every Day   Packs/day: 1.00   Years: 47.00   Total pack years: 47.00   Types: Cigarettes  Smokeless Tobacco Never  Tobacco Comments   Smokes 1 pack daily 01/04/21 ARJ    BP Readings from Last 3 Encounters:  02/22/22 124/60  02/14/22 (!) 90/50  01/31/22 130/60   Pulse Readings from Last 3 Encounters:  02/22/22 63  02/14/22 77  01/31/22 88   Wt Readings from Last 3 Encounters:  02/22/22 138 lb (62.6 kg)  02/14/22 134 lb (60.8 kg)  01/31/22 136 lb (61.7 kg)   BMI Readings from Last 3 Encounters:  02/22/22 25.24 kg/m  02/14/22 24.51 kg/m  01/31/22 24.87 kg/m    Assessment/Interventions: Review of patient past medical history, allergies, medications, health status, including review of consultants reports, laboratory and other test data, was performed as part of comprehensive evaluation and provision of chronic care management services.   SDOH:  (Social Determinants of Health) assessments and interventions performed: Yes SDOH Interventions    Flowsheet Row Chronic Care Management from 03/15/2022 in [place] from 02/20/2022 in [place] Office Visit from 02/14/2022 in [place] Office Visit from  11/03/2021 in [place] Management from 10/20/2021 in [place] Management from 08/11/2021 in [place]  SDOH Interventions        Food Insecurity Interventions -- Intervention Not Indicated -- -- -- --  Housing Interventions -- Other (Comment)  [reports that she is having problems with lock and filters need changing] -- -- -- --  Transportation Interventions Other (Comment)  [Already referred to [facility]. Pharmacy delivering her meds] Other (Comment)  [  referral to [place] worker] -- -- -- --  Utilities Interventions -- Intervention Not Indicated -- -- -- --  Depression Interventions/Treatment  -- -- Medication Medication -- Medication  Financial Strain Interventions Intervention Not Indicated -- -- -- Intervention Not Indicated --      SDOH Screenings   Food Insecurity: No Food Insecurity (02/20/2022)  Housing: Low Risk  (02/20/2022)  Transportation Needs: Unmet Transportation Needs (03/15/2022)  Utilities: Not At Risk (02/20/2022)  Alcohol Screen: Low Risk  (10/12/2020)  Depression (PHQ2-9): High Risk (02/14/2022)  Financial Resource Strain: Low Risk  (03/15/2022)  Physical Activity: Insufficiently Active (10/12/2020)  Social Connections: Socially Isolated (10/12/2020)  Stress: Stress Concern Present (10/12/2020)  Tobacco Use: High Risk (02/22/2022)    CCM Care Plan  Allergies  Allergen Reactions   Meperidine Anaphylaxis and Other (See Comments)    Blood pressure dropped, also   Meperidine Hcl Anaphylaxis and Other (See Comments)    B/P dropped, also   Influenza Vaccines Other (See Comments)    Flu-like symptoms   Other Rash and Other (See Comments)    asparagus    Medications Reviewed Today     Reviewed by Lane Hacker, [facility] (Pharmacist) on 03/15/22 at 1405  Med List Status: <None>   Medication Order Taking? Sig Documenting Provider Last Dose Status Informant  albuterol (PROVENTIL) (2.5 MG/3ML) 0.083% nebulizer solution  [id number] No Take 3 mLs (2.5 mg total) by nebulization every 6 (six) hours as needed for wheezing or shortness of breath. Lillard Anes, MD Taking Active   aspirin EC 81 MG tablet [id number] No Take 1 tablet (81 mg total) by mouth daily. Swallow whole. Berniece Salines, DO Taking Active   Budeson-Glycopyrrol-Formoterol 160-9-4.8 MCG/ACT AERO [id number] No Inhale 2 puffs into the lungs 2 (two) times daily as needed (shortness of breath). Lillard Anes, MD Taking Active   buPROPion (WELLBUTRIN XL) 150 MG 24 hr tablet [id number] No TAKE 1 TABLET(150 MG) BY MOUTH DAILY Rip Harbour, NP Taking Active   cloNIDine (CATAPRES) 0.1 MG tablet [id number]  TAKE ONE TABLET BY MOUTH EVERY MORNING and TAKE ONE TABLET BY MOUTH EVERYDAY AT BEDTIME Lillard Anes, MD  Active   diazepam (VALIUM) 5 MG tablet [id number] No TAKE 1 TABLET BY MOUTH EVERY 12 HOURS AS NEEDED FOR ANXIETY. WATCH FOR SEDATION Cox, Kirsten, MD Taking Active   ibuprofen (ADVIL) 800 MG tablet [id number]  TAKE ONE TABLET BY MOUTH every EIGHT hours AS NEEDED FOR moderate pain Cox, Kirsten, MD  Active   isosorbide mononitrate (IMDUR) 30 MG 24 hr tablet [id number]  Take 0.5 tablets (15 mg total) by mouth daily. RevankarReita Cliche, MD  Active   lisinopril-hydrochlorothiazide (ZESTORETIC) 20-12.5 MG tablet [id number]  TAKE TWO TABLETS BY MOUTH EVERY MORNING Lillard Anes, MD  Active   meclizine (ANTIVERT) 25 MG tablet [id number] No Take 1 tablet (25 mg total) by mouth 3 (three) times daily as needed for dizziness. Cox, Kirsten, MD Taking Active   nitroGLYCERIN (NITROSTAT) 0.4 MG SL tablet [id number] No Place 0.4 mg under the tongue every 5 (five) minutes as needed for chest pain. [provider] Taking Active   omeprazole (PRILOSEC) 40 MG capsule [id number]  TAKE ONE CAPSULE BY MOUTH EVERY MORNING Lillard Anes, MD  Active   oxyCODONE-acetaminophen (PERCOCET) 10-325 MG tablet [id number] No Take 1 tablet by mouth every  8 (eight) hours as needed for pain. Lillard Anes, MD Taking Active   rosuvastatin (CRESTOR) 10 MG tablet [id number] No Take 1  tablet (10 mg total) by mouth daily. Lillard Anes, MD Taking Active   traZODone (DESYREL) 100 MG tablet [id number]  TAKE ONE TABLET BY MOUTH AT bedtime Lillard Anes, MD  Active             Patient Active Problem List   Diagnosis Date Noted   Cigarette smoker 02/22/2022   Anxiety 02/21/2022   Aortic atherosclerosis ([place]) 02/21/2022   Ascending aorta dilation ([place]) 02/21/2022   Asthma 02/21/2022   CAD (coronary artery disease) 02/21/2022   Depression 02/21/2022   GERD (gastroesophageal reflux disease) 02/21/2022   History of kidney stones 02/21/2022   Hypertension 02/21/2022   Lupus ([place]) 02/21/2022   Myocardial infarction ([place]) 02/21/2022   Paraesophageal hernia 02/21/2022   Presbyesophagus 02/21/2022   PSVT (paroxysmal supraventricular tachycardia) 02/21/2022   Schatzki's ring 02/21/2022   BMI 24.0-24.9, adult 02/14/2022   Sciatica 01/31/2022   Renal calculus 12/21/2021   S/P repair of paraesophageal hernia 02/22/2021   Coronary artery disease involving native coronary artery of native heart without angina pectoris 11/18/2020   Cataract 11/16/2020   Osteoarthritis    Enlarged aorta (HCC)    Depression, major, single episode, severe ([place]) 03/30/2020   PAC (premature atrial contraction) 02/03/2020   PAT (paroxysmal atrial tachycardia) 40/98/1191   Diastolic dysfunction 47/82/9562   Tobacco use 08/19/2019   Type 2 diabetes mellitus with other specified complication ([place]) 13/12/6576   Hyperlipidemia 08/07/2019   Essential hypertension 08/07/2019   COPD (chronic obstructive pulmonary disease) ([place]) 08/07/2019   GERD with esophagitis 08/07/2019   Degenerative joint disease involving multiple joints 09/28/2017   Long-term current use of opiate analgesic 06/25/2017   Pain of right hip joint 11/22/2015   Chronic left shoulder  pain 09/27/2015   S/P total knee arthroplasty 02/15/2015   Primary osteoarthritis of left knee 02/04/2015    Immunization History  Administered Date(s) Administered   Fluad Quad(high Dose 65+) 03/30/2020, 05/05/2021   Influenza, High Dose Seasonal PF 03/28/2018    Conditions to be addressed/monitored:  Hypertension, Hyperlipidemia, Diabetes, GERD, COPD and Depression  Care Plan : [place]  Updates made by Lane Hacker, RPH since 03/15/2022 12:00 AM     Problem: htn, hld, copd   Priority: High  Onset Date: 09/03/2020     Long-Range Goal: Disease Management   Start Date: 09/03/2020  Expected End Date: 09/03/2021  Recent Progress: On track  Priority: High  Note:    Interventions: 1:1 collaboration with Lillard Anes, MD regarding development and update of comprehensive plan of care as evidenced by provider attestation and co-signature Inter-disciplinary care team collaboration (see longitudinal plan of care) Comprehensive medication review performed; medication list updated in electronic medical record  Hypertension (BP goal <140/90) BP Readings from Last 3 Encounters:  02/22/22 124/60  02/14/22 (!) 90/50  01/31/22 130/60  -Controlled -Current treatment: lisinopril-hydrochlorothiazide 20-12.5 mg 2 tablets daily Appropriate, Effective, Safe, Accessible Clonidine 0.1 mg bid Appropriate, Effective, Safe, Accessible ISMN 6m take 1/2 QD Appropriate, Effective, Safe, Accessible -Medications previously tried:  amlodipine, hydrochlorothiazide, furosemide,  -Current home readings:  October 2023: Counseled to start checking BP at home. Her monitor broke and counseled to get a new one -Current dietary habits: trying to eat heart healthy and lower sodium.  -Current exercise habits:  has done home PT before. She knows how to do chair exercises and does them at home 1-2 times daily.  -Denies hypotensive/hypertensive symptoms -Educated on BP goals and  benefits of medications for prevention of  heart attack, stroke and kidney damage; Daily salt intake goal < 2300 mg; Exercise goal of 150 minutes per week; Importance of home blood pressure monitoring; -Counseled to monitor BP at home weekly, document, and provide log at future appointments -Counseled on diet and exercise extensively Recommended to continue current medication  CAD: (LDL goal < 100) The ASCVD Risk score (Arnett DK, et al., 2019) failed to calculate for the following reasons:   The patient has a prior MI or stroke diagnosis Lab Results  Component Value Date   CHOL 164 02/14/2022   CHOL 187 10/12/2021   CHOL 143 05/05/2021   Lab Results  Component Value Date   HDL 67 02/14/2022   HDL 73 10/12/2021   HDL 64 05/05/2021   Lab Results  Component Value Date   LDLCALC 87 02/14/2022   LDLCALC 103 (H) 10/12/2021   LDLCALC 66 05/05/2021   Lab Results  Component Value Date   TRIG 45 02/14/2022   TRIG 59 10/12/2021   TRIG 62 05/05/2021   Lab Results  Component Value Date   CHOLHDL 2.4 02/14/2022   CHOLHDL 2.6 10/12/2021   CHOLHDL 2.2 05/05/2021  No results found for: "LDLDIRECT" -Controlled -Current treatment: Rosuvastatin 10 mg daily Appropriate, Effective, Safe, Accessible ASA 46m Appropriate, Effective, Safe, Accessible -Medications previously tried: lovastatin, atorvastatin -Current dietary patterns:  trying to abide by heart healthy and lower sodium. Doesn't eat breakfast. [place] for lunch. Drinking Ensure. Eats oatmeal. Frozen chicken nuggets and fries.  -Current exercise habits: chair exercises 1-2 times daily  -Educated on Cholesterol goals;  Benefits of statin for ASCVD risk reduction; Importance of limiting foods high in cholesterol; Exercise goal of 150 minutes per week; -Counseled on diet and exercise extensively September 2022: Candidate for higher statin with risk factors March 2023: Candidate for higher intensity statin May  2023: Recommended to PCP higher statin, PCP would like to maintain therapy for now  COPD (Goal: control symptoms and prevent exacerbations)    08/02/2020    2:19 PM  CAT Score  Total CAT Score 28   PF Readings from Last 3 Encounters:  No data found for PF  -Not ideally controlled -Current treatment  albuterol nebulizer solution 3 mls every 6 hours prn wheezing or shortness of breath Appropriate, Effective, Safe, Accessible Albuterol inhaler 2 puffs into lungs every 6 hours prn wheezing/shortness of breath Appropriate, Effective, Safe, Accessible Breztri 160/9/4.8 mcg/act 2 puffs into the lungs am and bedtime Appropriate, Effective, Safe, Accessible -Medications previously tried: none reported  -Pulmonary function testing: ordered with pulmonology for 10/2020 -Frequency of rescue inhaler use: daily with exertion -Counseled on Proper inhaler technique; Benefits of consistent maintenance inhaler use smoking cessation.  -Counseled on diet and exercise extensively Recommended to continue current medication Counseled on smoking cessation. Smoking 1.5 packs per day.  March 2023: Patient's main priorities today were insomnia/mood, will push back COPD assessment to f/u appt October 2023: Patient was in bed and tired after shot yesterday. Wasn't interested in CAT score today  Depression/Anxiety (Goal: manage symptoms of depression and anxiety) -Controlled -Current treatment: bupropion xl 150 mg daily Appropriate, Query effective, Safe, Accessible Diazepam 5 mg every 12 hours prn anxiety (Once every other week per patient, [facility]) Appropriate, Effective, Safe, Accessible -Medications previously tried/failed: Savella, sertraline, trazodone, Aripiprazole (Dizzy)    02/14/2022    2:55 PM 11/03/2021   11:29 AM 08/11/2021   11:14 AM  Depression screen PHQ 2/9  Decreased Interest  1 1 1   Down, Depressed, Hopeless 2 1 3   PHQ - 2 Score 3 2 4   Altered sleeping 2 3 3    Tired, decreased energy 3 3 3   Change in appetite 3 3 3   Feeling bad or failure about yourself  2 0 3  Trouble concentrating 2 3 3   Moving slowly or fidgety/restless 2 1 3   Suicidal thoughts 0 0 0  PHQ-9 Score 17 15 22   Difficult doing work/chores Somewhat difficult Not difficult at all Very difficult      09/03/2020   10:49 AM  GAD 7 : Generalized Anxiety Score  Nervous, Anxious, on Edge 2  Control/stop worrying 2  Worry too much - different things 2  Trouble relaxing 1  Restless 1  Easily annoyed or irritable 1  Afraid - awful might happen 1  Total GAD 7 Score 10  Anxiety Difficulty Somewhat difficult  -Educated on Benefits of medication for symptom control -Counseled on diet and exercise extensively September 2022: Will ask PCP about increasing therapy March 2023: Patient's son passed November 2022, is unable to sleep at night because mind is racing. PHQ9 has increased. Will try to coordinate with PCP to increase therapy May 2023: Patient visited ER for dizziness, has been going on a few weeks. Most likely Aripiprazole, will let PCP know Update: PCP agreed to DC Aripiprazole, called patient and will have [facility] coordinate removal from packs  Insomnia (Goal: 7-8 hours of sleep/night) -Uncontrolled -Patient currently getting 3 hours of sleep/night -Patient currently waking up 0 times/night -Current treatment: Trazodone 18m HS Appropriate, Query effective, Safe, Accessible -Medications previously tried: N/A -Counseled on sleep hygiene techniques (consistent sleep/wake up schedule, no electronic screens 1-2 hours before bed, etc) March 2023: Patient getting 3 hours of sleep/night. Goes to bed at 2000 and watches TV for a few hours until 2230 in bed. Is unable to sleep until 0300. Counsled on sleep hygiene (Will watch TV in living room and colour for 30 mins before bed). Patient states she can't sleep because her mind is racing (Son passed November 2022), will coordinate with  PCP to increase mental health therapy October 2023: Patient's main focus today is on pain.   GERD (Goal: manage symptoms of GERD) -Not ideally controlled -Current treatment  Omeprazole 40 mg daily Appropriate, Effective, Safe, Accessible Promethazine 25 mg every 6 hours prn nausea/vomiting Appropriate, Effective, Safe, Accessible -Medications previously tried:  Linzess, metoclopramide,  -Counseled on diet and exercise extensively Recommended to continue current medication September 2022: Just had Hiatal Hernia Sx, defer to specialist  Tobacco use (Goal reduce cigarette use) -Uncontrolled -Previous quit attempts: none reported -Current treatment  Patient take bupropion for depression and is not ready to quit at this time  -Patient smokes Within 30 minutes of waking -Patient triggers include: stress, anxiety and sadness and craving the taste of a cigarette and needing to do something with your hands/mouth -On a scale of 1-10, reports MOTIVATION to quit is 0 -On a scale of 1-10, reports CONFIDENCE in quitting is 0 -Provided contact information for [place] [facility]uit Line (1-800-QUIT-NOW) and encouraged patient to reach out to this group for support. -Educated on options when patient is ready. Patient hopes to maybe begin with reducing from 1.5 packs per day. She is not ready to begin cessation at this time and will contact office when she is ready.  May 2023: Patient not ready to quit, would like to try smoking 1/2 PPD (Is at 1PPD now) October 2023: Not ready to quit  Osteoporosis / Osteopenia (Goal minimize risk of breaks or fractures) Last vitamin D Lab Results  Component Value Date   VD25OH 17.3 (L) 12/10/2019  -Last DEXA Scan:  has had one a long time ago. Needs updated scan completed within [place] to allow for transportation. .   -Current treatment  Diet  -Medications previously tried:  vitamin d -Recommend [phone] units of vitamin D daily. Recommend 1200 mg of calcium daily  from dietary and supplemental sources. Recommend weight-bearing and muscle strengthening exercises for building and maintaining bone density. -Counseled on diet and exercise extensively March 2023: Recommended updated Dexa scan and Vitamin D level drawn with next labs.  May 2023: Same recommendations October 2023: Same recommendations   Patient Goals/Self-Care Activities Patient will:  - take medications as prescribed focus on medication adherence by using packaing and delivery through [facility].  check blood pressure weekly, document, and provide at future appointments target a minimum of 150 minutes of moderate intensity exercise weekly engage in dietary modifications by heart-healthy, low-sodium diet.   Follow Up Plan: Telephone follow up appointment with care management team member scheduled for: prn  [place] Constable, Pharm.D. - [phone]       Medication Assistance: None required.  Patient affirms current coverage meets needs.  Patient's preferred pharmacy is:  [facility] - [place], [place] - [address] [place] [address] Phone: [phone] Fax: [phone]  [facility] [place], [place] - [address] [place] [address] Phone: [phone] Fax: [phone]   Uses pill box? Yes Pt endorses misssed doses or taking later than scheduled.   Meds: Patient was with [facility] but went back to [facility]. She stated that the person who used to drive her to the Pharmacy can no longer and she'd like to go back to [facility]. Plan: Verbal consent obtained for [facility] enhanced pharmacy services (medication synchronization, adherence packaging, delivery coordination). A medication sync plan was created to allow patient to get all medications delivered once every 30 to 90 days per patient preference. Patient understands they have freedom to choose  pharmacy and clinical pharmacist will coordinate care between all prescribers and [facility].  Care Plan and Follow Up Patient Decision:  Patient agrees to Care Plan and Follow-up.  Plan: Telephone follow up appointment with care management team member scheduled for:  April 2024  Arizona Constable, [place].D. - [phone]

## 2022-03-15 NOTE — Patient Instructions (Signed)
Visit Information   Goals Addressed   None    Patient Care Plan: CCM Pharmacy Care Plan     Problem Identified: htn, hld, copd   Priority: High  Onset Date: 09/03/2020     Long-Range Goal: Disease Management   Start Date: 09/03/2020  Expected End Date: 09/03/2021  Recent Progress: On track  Priority: High  Note:    Interventions: 1:1 collaboration with Lillard Anes, MD regarding development and update of comprehensive plan of care as evidenced by provider attestation and co-signature Inter-disciplinary care team collaboration (see longitudinal plan of care) Comprehensive medication review performed; medication list updated in electronic medical record  Hypertension (BP goal <140/90) BP Readings from Last 3 Encounters:  02/22/22 124/60  02/14/22 (!) 90/50  01/31/22 130/60  -Controlled -Current treatment: lisinopril-hydrochlorothiazide 20-12.5 mg 2 tablets daily Appropriate, Effective, Safe, Accessible Clonidine 0.1 mg bid Appropriate, Effective, Safe, Accessible ISMN '30mg'$  take 1/2 QD Appropriate, Effective, Safe, Accessible -Medications previously tried:  amlodipine, hydrochlorothiazide, furosemide,  -Current home readings:  October 2023: Counseled to start checking BP at home. Her monitor broke and counseled to get a new one -Current dietary habits: trying to eat heart healthy and lower sodium.  -Current exercise habits:  has done home PT before. She knows how to do chair exercises and does them at home 1-2 times daily.  -Denies hypotensive/hypertensive symptoms -Educated on BP goals and benefits of medications for prevention of heart attack, stroke and kidney damage; Daily salt intake goal < 2300 mg; Exercise goal of 150 minutes per week; Importance of home blood pressure monitoring; -Counseled to monitor BP at home weekly, document, and provide log at future appointments -Counseled on diet and exercise extensively Recommended to continue current  medication  CAD: (LDL goal < 100) The ASCVD Risk score (Arnett DK, et al., 2019) failed to calculate for the following reasons:   The patient has a prior MI or stroke diagnosis Lab Results  Component Value Date   CHOL 164 02/14/2022   CHOL 187 10/12/2021   CHOL 143 05/05/2021   Lab Results  Component Value Date   HDL 67 02/14/2022   HDL 73 10/12/2021   HDL 64 05/05/2021   Lab Results  Component Value Date   LDLCALC 87 02/14/2022   LDLCALC 103 (H) 10/12/2021   LDLCALC 66 05/05/2021   Lab Results  Component Value Date   TRIG 45 02/14/2022   TRIG 59 10/12/2021   TRIG 62 05/05/2021   Lab Results  Component Value Date   CHOLHDL 2.4 02/14/2022   CHOLHDL 2.6 10/12/2021   CHOLHDL 2.2 05/05/2021  No results found for: "LDLDIRECT" -Controlled -Current treatment: Rosuvastatin 10 mg daily Appropriate, Effective, Safe, Accessible ASA '81mg'$  Appropriate, Effective, Safe, Accessible -Medications previously tried: lovastatin, atorvastatin -Current dietary patterns:  trying to abide by heart healthy and lower sodium. Doesn't eat breakfast. [place] with fruit for lunch. Drinking Ensure. Eats oatmeal. Frozen chicken nuggets and fries.  -Current exercise habits: chair exercises 1-2 times daily  -Educated on Cholesterol goals;  Benefits of statin for ASCVD risk reduction; Importance of limiting foods high in cholesterol; Exercise goal of 150 minutes per week; -Counseled on diet and exercise extensively September 2022: Candidate for higher statin with risk factors March 2023: Candidate for higher intensity statin May 2023: Recommended to PCP higher statin, PCP would like to maintain therapy for now  COPD (Goal: control symptoms and prevent exacerbations)    08/02/2020    2:19 PM  CAT Score  Total CAT Score  28   PF Readings from Last 3 Encounters:  No data found for PF  -Not ideally controlled -Current treatment  albuterol nebulizer solution 3 mls every 6 hours prn wheezing  or shortness of breath Appropriate, Effective, Safe, Accessible Albuterol inhaler 2 puffs into lungs every 6 hours prn wheezing/shortness of breath Appropriate, Effective, Safe, Accessible Breztri 160/9/4.8 mcg/act 2 puffs into the lungs am and bedtime Appropriate, Effective, Safe, Accessible -Medications previously tried: none reported  -Pulmonary function testing: ordered with pulmonology for 10/2020 -Frequency of rescue inhaler use: daily with exertion -Counseled on Proper inhaler technique; Benefits of consistent maintenance inhaler use smoking cessation.  -Counseled on diet and exercise extensively Recommended to continue current medication Counseled on smoking cessation. Smoking 1.5 packs per day.  March 2023: Patient's main priorities today were insomnia/mood, will push back COPD assessment to f/u appt October 2023: Patient was in bed and tired after shot yesterday. Wasn't interested in CAT score today  Depression/Anxiety (Goal: manage symptoms of depression and anxiety) -Controlled -Current treatment: bupropion xl 150 mg daily Appropriate, Query effective, Safe, Accessible Diazepam 5 mg every 12 hours prn anxiety (Once every other week per patient, if she is going to [place] or something) Appropriate, Effective, Safe, Accessible -Medications previously tried/failed: Savella, sertraline, trazodone, Aripiprazole (Dizzy)    02/14/2022    2:55 PM 11/03/2021   11:29 AM 08/11/2021   11:14 AM  Depression screen PHQ 2/9  Decreased Interest '1 1 1  '$ Down, Depressed, Hopeless '2 1 3  '$ PHQ - 2 Score '3 2 4  '$ Altered sleeping '2 3 3  '$ Tired, decreased energy '3 3 3  '$ Change in appetite '3 3 3  '$ Feeling bad or failure about yourself  2 0 3  Trouble concentrating '2 3 3  '$ Moving slowly or fidgety/restless '2 1 3  '$ Suicidal thoughts 0 0 0  PHQ-9 Score '17 15 22  '$ Difficult doing work/chores Somewhat difficult Not difficult at all Very difficult      09/03/2020   10:49 AM  GAD 7 : Generalized Anxiety  Score  Nervous, Anxious, on Edge 2  Control/stop worrying 2  Worry too much - different things 2  Trouble relaxing 1  Restless 1  Easily annoyed or irritable 1  Afraid - awful might happen 1  Total GAD 7 Score 10  Anxiety Difficulty Somewhat difficult  -Educated on Benefits of medication for symptom control -Counseled on diet and exercise extensively September 2022: Will ask PCP about increasing therapy March 2023: Patient's son passed November 2022, is unable to sleep at night because mind is racing. PHQ9 has increased. Will try to coordinate with PCP to increase therapy May 2023: Patient visited ER for dizziness, has been going on a few weeks. Most likely Aripiprazole, will let PCP know Update: PCP agreed to DC Aripiprazole, called patient and will have [facility] coordinate removal from packs  Insomnia (Goal: 7-8 hours of sleep/night) -Uncontrolled -Patient currently getting 3 hours of sleep/night -Patient currently waking up 0 times/night -Current treatment: Trazodone '100mg'$  HS Appropriate, Query effective, Safe, Accessible -Medications previously tried: N/A -Counseled on sleep hygiene techniques (consistent sleep/wake up schedule, no electronic screens 1-2 hours before bed, etc) March 2023: Patient getting 3 hours of sleep/night. Goes to bed at 2000 and watches TV for a few hours until 2230 in bed. Is unable to sleep until 0300. Counsled on sleep hygiene (Will watch TV in living room and colour for 30 mins before bed). Patient states she can't sleep because her mind is racing (Son passed  November 2022), will coordinate with PCP to increase mental health therapy October 2023: Patient's main focus today is on pain.   GERD (Goal: manage symptoms of GERD) -Not ideally controlled -Current treatment  Omeprazole 40 mg daily Appropriate, Effective, Safe, Accessible Promethazine 25 mg every 6 hours prn nausea/vomiting Appropriate, Effective, Safe, Accessible -Medications previously tried:   Linzess, metoclopramide,  -Counseled on diet and exercise extensively Recommended to continue current medication September 2022: Just had Hiatal Hernia Sx, defer to specialist  Tobacco use (Goal reduce cigarette use) -Uncontrolled -Previous quit attempts: none reported -Current treatment  Patient take bupropion for depression and is not ready to quit at this time  -Patient smokes Within 30 minutes of waking -Patient triggers include: stress, anxiety and sadness and craving the taste of a cigarette and needing to do something with your hands/mouth -On a scale of 1-10, reports MOTIVATION to quit is 0 -On a scale of 1-10, reports CONFIDENCE in quitting is 0 -Provided contact information for [place] [facility]uit [facility] (1-800-QUIT-NOW) and encouraged patient to reach out to this group for support. -Educated on options when patient is ready. Patient hopes to maybe begin with reducing from 1.5 packs per day. She is not ready to begin cessation at this time and will contact office when she is ready.  May 2023: Patient not ready to quit, would like to try smoking 1/2 PPD (Is at 1PPD now) October 2023: Not ready to quit  Osteoporosis / Osteopenia (Goal minimize risk of breaks or fractures) Last vitamin D Lab Results  Component Value Date   VD25OH 17.3 (L) 12/10/2019  -Last DEXA Scan:  has had one a long time ago. Needs updated scan completed within [place] to allow for transportation. .   -Current treatment  Diet  -Medications previously tried:  vitamin d -Recommend [phone] units of vitamin D daily. Recommend 1200 mg of calcium daily from dietary and supplemental sources. Recommend weight-bearing and muscle strengthening exercises for building and maintaining bone density. -Counseled on diet and exercise extensively March 2023: Recommended updated Dexa scan and Vitamin D level drawn with next labs.  May 2023: Same recommendations October 2023: Same recommendations   Patient Goals/Self-Care  Activities Patient will:  - take medications as prescribed focus on medication adherence by using packaing and delivery through [facility].  check blood pressure weekly, document, and provide at future appointments target a minimum of 150 minutes of moderate intensity exercise weekly engage in dietary modifications by heart-healthy, low-sodium diet.   Follow Up Plan: Telephone follow up appointment with care management team member scheduled for: prn  [place] Constable, Pharm.D. - [phone]      Ms. Neece was given information about Chronic Care Management services today including:  CCM service includes personalized support from designated clinical staff supervised by her physician, including individualized plan of care and coordination with other care providers 24/7 contact phone numbers for assistance for urgent and routine care needs. Standard insurance, coinsurance, copays and deductibles apply for chronic care management only during months in which we provide at least 20 minutes of these services. Most insurances cover these services at 100%, however patients may be responsible for any copay, coinsurance and/or deductible if applicable. This service may help you avoid the need for more expensive face-to-face services. Only one practitioner may furnish and bill the service in a calendar month. The patient may stop CCM services at any time (effective at the end of the month) by phone call to the office staff.  Patient agreed to services and  verbal consent obtained.   The patient verbalized understanding of instructions, educational materials, and care plan provided today and DECLINED offer to receive copy of patient instructions, educational materials, and care plan.  The pharmacy team will reach out to the patient again over the next 60 days.   Lane Hacker, [place]

## 2022-03-16 ENCOUNTER — Telehealth: Payer: Self-pay

## 2022-03-16 NOTE — Progress Notes (Signed)
Chronic Care Management Pharmacy Assistant   Name: [name] [name]  MRN: [id number] DOB: 07-26-1945   Reason for Encounter: Medication Coordination for [facility]    Recent office visits:  None  Recent consult visits:  None  Hospital visits:  None  Medications: Outpatient Encounter Medications as of 03/16/2022  Medication Sig   albuterol (PROVENTIL) (2.5 MG/3ML) 0.083% nebulizer solution Take 3 mLs (2.5 mg total) by nebulization every 6 (six) hours as needed for wheezing or shortness of breath.   aspirin EC 81 MG tablet Take 1 tablet (81 mg total) by mouth daily. Swallow whole.   Budeson-Glycopyrrol-Formoterol 160-9-4.8 MCG/ACT AERO Inhale 2 puffs into the lungs 2 (two) times daily as needed (shortness of breath).   buPROPion (WELLBUTRIN XL) 150 MG 24 hr tablet TAKE 1 TABLET(150 MG) BY MOUTH DAILY   cloNIDine (CATAPRES) 0.1 MG tablet TAKE ONE TABLET BY MOUTH EVERY MORNING and TAKE ONE TABLET BY MOUTH EVERYDAY AT BEDTIME   diazepam (VALIUM) 5 MG tablet TAKE 1 TABLET BY MOUTH EVERY 12 HOURS AS NEEDED FOR ANXIETY. WATCH FOR SEDATION   ibuprofen (ADVIL) 800 MG tablet TAKE ONE TABLET BY MOUTH every EIGHT hours AS NEEDED FOR moderate pain   isosorbide mononitrate (IMDUR) 30 MG 24 hr tablet Take 0.5 tablets (15 mg total) by mouth daily.   lisinopril-hydrochlorothiazide (ZESTORETIC) 20-12.5 MG tablet TAKE TWO TABLETS BY MOUTH EVERY MORNING   meclizine (ANTIVERT) 25 MG tablet Take 1 tablet (25 mg total) by mouth 3 (three) times daily as needed for dizziness.   nitroGLYCERIN (NITROSTAT) 0.4 MG SL tablet Place 0.4 mg under the tongue every 5 (five) minutes as needed for chest pain.   omeprazole (PRILOSEC) 40 MG capsule TAKE ONE CAPSULE BY MOUTH EVERY MORNING   oxyCODONE-acetaminophen (PERCOCET) 10-325 MG tablet Take 1 tablet by mouth every 8 (eight) hours as needed for pain.   rosuvastatin (CRESTOR) 10 MG tablet Take 1 tablet (10 mg total) by mouth daily.   traZODone (DESYREL) 100 MG tablet  TAKE ONE TABLET BY MOUTH AT bedtime   No facility-administered encounter medications on file as of 03/16/2022.    Reviewed chart for medication changes ahead of medication coordination call.  No OVs, Consults, or hospital visits since last care coordination call/Pharmacist visit. (If appropriate, list visit date, provider name)  No medication changes indicated OR if recent visit, treatment plan here.  BP Readings from Last 3 Encounters:  02/22/22 124/60  02/14/22 (!) 90/50  01/31/22 130/60    Lab Results  Component Value Date   HGBA1C 5.2 02/14/2022     Patient obtains medications through Adherence Packaging  30 Days   Last adherence delivery included:  Isosorbide Monoitrate 30 mg 0.5 tablet at Breakfast  Rosuvastatin 10 mg 1 at bedtime Clonidine 0.1 mg 1 at breakfast and 1 at Bedtime Bupropion XL 150 mg 1 at breakfast Trazodone 100 mg 1 at bedtime prn (Vials) Lisinopril-HCTZ 20-12.5 mg 2 at breakfast Omeprazole 40 mg 1 at breakfast  Meclizine '25mg'$ - 1 tablet 3 times daily as needed  Ibuprofen 800 mg every 8 hours as needed  Patient declined (meds) last month  Unable to reach pt   Patient is due for next adherence delivery on: 03/28/22. Called patient and reviewed medications and coordinated delivery.  This delivery to include: Isosorbide Monoitrate 30 mg 0.5 tablet at Breakfast  Rosuvastatin 10 mg 1 at bedtime Clonidine 0.1 mg 1 at breakfast and 1 at Bedtime Bupropion XL 150 mg 1 at breakfast Trazodone 100 mg 1  at bedtime prn (Vials) Lisinopril-HCTZ 20-12.5 mg 2 at breakfast Omeprazole 40 mg 1 at breakfast  Meclizine '25mg'$ - 1 tablet 3 times daily as needed  Ibuprofen 800 mg every 8 hours as needed  Patient declined the following medications  diazepam cannot be filled until 03/30/22, will fill and send when it is due. Albuterol Inhaler/Sulfate- Has plenty of supply Nitroglycerin- Has plenty of supply   Patient needs refills  Isosorbide Monoitrate 30 mg-[place]  sent refill request   Confirmed delivery date of 03/28/22, advised patient that pharmacy will contact them the morning of delivery.   Elray Mcgregor, [place] Pharmacist Assistant  [phone]

## 2022-03-23 ENCOUNTER — Ambulatory Visit: Payer: Self-pay

## 2022-03-23 NOTE — Patient Outreach (Signed)
  Care Coordination   Follow Up Visit Note   03/23/2022 Name: TANIKKA BRESNAN MRN: [id number] DOB: May 16, 1946  Connye Burkitt Strebeck is a 76 y.o. year old female who sees Cox, Kirsten, MD for primary care. I spoke with  Connye Burkitt Pilgrim by phone today.  What matters to the patients health and wellness today?  Placed call to patient today who reports that she is about the same.  She reports that she had her epidural injection. Reports next injection is 04/13/2022.  Reports pain is about the same. Patient reports that she continues to do home exercises. Denies any falls since last encounter.  Patient continues to use her walker.      Goals Addressed               This Visit's Progress     Patient wants to reduce falls (pt-stated)        Care Coordination Interventions: Assessed pain level Reviewed no recent falls since my last encounter. Reviewed importance of patient using walker and doing home exercise plan.  Reviewed next appointments. Discussed importance of flu vaccine.  Patient reports she feels like she is getting a cold.         SDOH assessments and interventions completed:  No     Care Coordination Interventions Activated:  Yes  Care Coordination Interventions:  Yes, provided   Follow up plan: Follow up call scheduled for 05/11/2022    Encounter Outcome:  Pt. Visit Completed   Tomasa Rand, RN, BSN, CEN [place] Coordinator [phone]

## 2022-03-28 DIAGNOSIS — J449 Chronic obstructive pulmonary disease, unspecified: Secondary | ICD-10-CM

## 2022-03-28 DIAGNOSIS — E785 Hyperlipidemia, unspecified: Secondary | ICD-10-CM

## 2022-03-28 DIAGNOSIS — F32A Depression, unspecified: Secondary | ICD-10-CM | POA: Diagnosis not present

## 2022-03-28 DIAGNOSIS — E1169 Type 2 diabetes mellitus with other specified complication: Secondary | ICD-10-CM

## 2022-03-28 DIAGNOSIS — M810 Age-related osteoporosis without current pathological fracture: Secondary | ICD-10-CM | POA: Diagnosis not present

## 2022-03-28 DIAGNOSIS — F17210 Nicotine dependence, cigarettes, uncomplicated: Secondary | ICD-10-CM

## 2022-04-03 ENCOUNTER — Ambulatory Visit (INDEPENDENT_AMBULATORY_CARE_PROVIDER_SITE_OTHER): Payer: Medicare Other

## 2022-04-03 DIAGNOSIS — Z23 Encounter for immunization: Secondary | ICD-10-CM

## 2022-04-05 ENCOUNTER — Telehealth: Payer: Self-pay

## 2022-04-05 NOTE — Telephone Encounter (Signed)
Patient brought paperwok for medicaid. I faxed it and mail the original to patient to address in the chart. Patient was notified.

## 2022-04-08 ENCOUNTER — Other Ambulatory Visit: Payer: Self-pay | Admitting: Family Medicine

## 2022-04-08 DIAGNOSIS — F322 Major depressive disorder, single episode, severe without psychotic features: Secondary | ICD-10-CM

## 2022-04-08 DIAGNOSIS — I10 Essential (primary) hypertension: Secondary | ICD-10-CM

## 2022-04-08 DIAGNOSIS — E782 Mixed hyperlipidemia: Secondary | ICD-10-CM

## 2022-04-14 ENCOUNTER — Telehealth: Payer: Self-pay

## 2022-04-14 NOTE — Progress Notes (Signed)
Chronic Care Management Pharmacy Assistant   Name: [name] [name]  MRN: [id number] DOB: December 04, 1945   Reason for Encounter: Medication Coordination for [facility]    Recent office visits:  None  Recent consult visits:  None  Hospital visits:  None  Medications: Outpatient Encounter Medications as of 04/14/2022  Medication Sig   albuterol (PROVENTIL) (2.5 MG/3ML) 0.083% nebulizer solution Take 3 mLs (2.5 mg total) by nebulization every 6 (six) hours as needed for wheezing or shortness of breath.   aspirin EC 81 MG tablet Take 1 tablet (81 mg total) by mouth daily. Swallow whole.   Budeson-Glycopyrrol-Formoterol 160-9-4.8 MCG/ACT AERO Inhale 2 puffs into the lungs 2 (two) times daily as needed (shortness of breath).   buPROPion (WELLBUTRIN XL) 150 MG 24 hr tablet TAKE 1 TABLET(150 MG) BY MOUTH DAILY   cloNIDine (CATAPRES) 0.1 MG tablet TAKE ONE TABLET BY MOUTH EVERY MORNING and TAKE ONE TABLET BY MOUTH EVERYDAY AT BEDTIME   diazepam (VALIUM) 5 MG tablet TAKE 1 TABLET BY MOUTH EVERY 12 HOURS AS NEEDED FOR ANXIETY. WATCH FOR SEDATION   ibuprofen (ADVIL) 800 MG tablet TAKE ONE TABLET BY MOUTH every EIGHT hours AS NEEDED FOR moderate pain   isosorbide mononitrate (IMDUR) 30 MG 24 hr tablet Take 0.5 tablets (15 mg total) by mouth daily.   lisinopril-hydrochlorothiazide (ZESTORETIC) 20-12.5 MG tablet TAKE TWO TABLETS BY MOUTH EVERY MORNING   meclizine (ANTIVERT) 25 MG tablet Take 1 tablet (25 mg total) by mouth 3 (three) times daily as needed for dizziness.   nitroGLYCERIN (NITROSTAT) 0.4 MG SL tablet Place 0.4 mg under the tongue every 5 (five) minutes as needed for chest pain.   omeprazole (PRILOSEC) 40 MG capsule TAKE ONE CAPSULE BY MOUTH EVERY MORNING   oxyCODONE-acetaminophen (PERCOCET) 10-325 MG tablet Take 1 tablet by mouth every 8 (eight) hours as needed for pain.   rosuvastatin (CRESTOR) 10 MG tablet Take 1 tablet (10 mg total) by mouth daily.   traZODone (DESYREL) 100 MG tablet  TAKE ONE TABLET BY MOUTH AT bedtime   No facility-administered encounter medications on file as of 04/14/2022.    Reviewed chart for medication changes ahead of medication coordination call.  No OVs, Consults, or hospital visits since last care coordination call/Pharmacist visit.   No medication changes indicated OR if recent visit, treatment plan here.  BP Readings from Last 3 Encounters:  02/22/22 124/60  02/14/22 (!) 90/50  01/31/22 130/60    Lab Results  Component Value Date   HGBA1C 5.2 02/14/2022     Patient obtains medications through Adherence Packaging  30 Days   Last adherence delivery included:  Isosorbide Monoitrate 30 mg 0.5 tablet at Breakfast  Rosuvastatin 10 mg 1 at bedtime Clonidine 0.1 mg 1 at breakfast and 1 at Bedtime Bupropion XL 150 mg 1 at breakfast Trazodone 100 mg 1 at bedtime prn (Vials) Lisinopril-HCTZ 20-12.5 mg 2 at breakfast Omeprazole 40 mg 1 at breakfast  Meclizine '25mg'$ - 1 tablet 3 times daily as needed  Ibuprofen 800 mg every 8 hours as needed  Patient declined (meds) last month  diazepam cannot be filled until 03/30/22, will fill and send when it is due. Albuterol Inhaler/Sulfate- Has plenty of supply Nitroglycerin- Has plenty of supply   Patient is due for next adherence delivery on: 04/27/22. Called patient and reviewed medications and coordinated delivery.  This delivery to include: Isosorbide Monoitrate 30 mg 0.5 tablet at Breakfast  Rosuvastatin 10 mg 1 at bedtime Clonidine 0.1 mg 1 at breakfast  and 1 at Bedtime Bupropion XL 150 mg 1 at breakfast Trazodone 100 mg 1 at bedtime prn (Vials) Lisinopril-HCTZ 20-12.5 mg 2 at breakfast Omeprazole 40 mg 1 at breakfast  Meclizine '25mg'$ - 1 tablet 3 times daily as needed  Phenergan '25mg'$ - Sent a script request   Patient declined the following medications  Ibuprofen '800mg'$ - Has plenty of supply Breztri Inhaler  Patient needs refills-Request Sent  Rosuvastatin 10 mg Phenergan  '25mg'$   Confirmed delivery date of 04/27/22, advised patient that pharmacy will contact them the morning of delivery.   Elray Mcgregor, [place] Pharmacist Assistant  [phone]

## 2022-04-17 ENCOUNTER — Telehealth: Payer: Self-pay

## 2022-04-17 DIAGNOSIS — M5416 Radiculopathy, lumbar region: Secondary | ICD-10-CM | POA: Diagnosis not present

## 2022-04-17 NOTE — Progress Notes (Signed)
  Chronic Care Management   Note  04/17/2022 Name: [name] [name] MRN: [id number] DOB: May 25, 1946  [name] [name] is a 76 y.o. year old female who is a primary care patient of Cox, Kirsten, MD. I reached out to [name] [name] by phone today in response to a referral sent by [name] [name] PCP.  [name] [name]  agreedto scheduling an appointment with the CCM RN Case Manager   Follow up plan: Patient agreed to scheduled appointment with RN Case Manager on 05/11/2022(date/time).   Noreene Larsson, [place], [place] [address] Direct Dial: [phone] [email or web address]

## 2022-04-17 NOTE — Telephone Encounter (Signed)
Compliant on meds 

## 2022-04-18 ENCOUNTER — Encounter: Payer: Self-pay | Admitting: *Deleted

## 2022-04-19 ENCOUNTER — Other Ambulatory Visit: Payer: Self-pay | Admitting: Family Medicine

## 2022-04-19 DIAGNOSIS — F4321 Adjustment disorder with depressed mood: Secondary | ICD-10-CM

## 2022-04-24 ENCOUNTER — Other Ambulatory Visit: Payer: Self-pay | Admitting: Legal Medicine

## 2022-04-24 DIAGNOSIS — K529 Noninfective gastroenteritis and colitis, unspecified: Secondary | ICD-10-CM

## 2022-05-11 ENCOUNTER — Ambulatory Visit (INDEPENDENT_AMBULATORY_CARE_PROVIDER_SITE_OTHER): Payer: Medicare Other

## 2022-05-11 ENCOUNTER — Telehealth: Payer: Medicare Other

## 2022-05-11 DIAGNOSIS — E782 Mixed hyperlipidemia: Secondary | ICD-10-CM

## 2022-05-11 DIAGNOSIS — F322 Major depressive disorder, single episode, severe without psychotic features: Secondary | ICD-10-CM

## 2022-05-11 DIAGNOSIS — R296 Repeated falls: Secondary | ICD-10-CM

## 2022-05-11 NOTE — Patient Instructions (Signed)
Please call the care guide team at [phone] if you need to cancel or reschedule your appointment.   If you are experiencing a Mental Health or [place] or need someone to talk to, please call the Suicide and Crisis Lifeline: 988 call the [place] National Suicide Prevention Lifeline: (909)393-9008 or TTY: (803) 631-3636 TTY [phone]) to talk to a trained counselor call 1-800-273-TALK (toll free, 24 hour hotline)   Following is a copy of your full provider care plan:   Goals Addressed             This Visit's Progress    CCM Expected Outcome:  Monitor, Self-Manage and Reduce Symptoms of: Depression       Current Barriers:  Knowledge Deficits related to effective management of pain and the impact her pain level has on her depression and stress level Care Coordination needs related to pain control and resources to help with dealing with her depression  in a patient with depression and high stress level Chronic Disease Management support and education needs related to effective management of depression  Lacks caregiver support.  Transportation barriers Grief and loss: Husband passed in 2014 and her son passed in 2022 of lung cancer   Planned Interventions: Evaluation of current treatment plan related to depression  and patient's adherence to plan as established by provider Advised patient to call the office for changes in mood, anxiety, stress level, depression, or mental health needs Provided education to patient re: resources available for transportation needs, resources for effective management of pain and discomfort, resources for effective management of depression and ongoing support Reviewed medications with patient and discussed compliance. The patient states she lays her medications out each night to take in the morning. Says she forgets things sometimes but she does not forget her medications Provided patient with community resources and other resources to help in  management of her chronic conditions educational materials related to effective management of depression Reviewed scheduled/upcoming provider appointments including 06-20-2022 at 2 pm Care Guide referral for transportation needs Discussed plans with patient for ongoing care management follow up and provided patient with direct contact information for care management team Advised patient to discuss changes in her mood, depression, or mental health needs with provider Screening for signs and symptoms of depression related to chronic disease state  Assessed social determinant of health barriers  Symptom Management: Take medications as prescribed   Attend all scheduled provider appointments Call provider office for new concerns or questions  call the Suicide and Crisis Lifeline: 988 call the [place] National Suicide Prevention Lifeline: 219-611-3755 or TTY: 364-737-6135 TTY [phone]) to talk to a trained counselor call 1-800-273-TALK (toll free, 24 hour hotline) if experiencing a Mental Health or [place]   Follow Up Plan: Telephone follow up appointment with care management team member scheduled for: 06-14-2022 at 230 pm       CCM Expected Outcome:  Monitor, Self-Manage and Reduce Symptoms of: Falls prevention and Safety       Current Barriers:  Knowledge Deficits related to fall prevention and safety in a patient with multiple chronic conditions Care Coordination needs related to transportation concerns in a patient with frequent falls and chronic pain  Chronic Disease Management support and education needs related to fall prevention and safety Lacks caregiver support.  Transportation barriers  Planned Interventions: Provided  verbal education re: potential causes of falls and Fall prevention strategies Reviewed medications and discussed potential side effects of medications such as dizziness and frequent urination. The  patient states she is compliant with medications.  Does have dizziness at times. Review of changing position slowly and monitoring for drops in blood pressure Advised patient of importance of notifying provider of falls. Review and education provided Assessed for signs and symptoms of orthostatic hypotension. Review and education provided Assessed for falls since last encounter. Last fall was on 05-07-2022 per the patient, she did not notify the provider Assessed patients knowledge of fall risk prevention secondary to previously provided education Provided patient information for fall alert systems Assessed working status of life alert bracelet and patient adherence Advised patient to discuss changes in safety in her environment, new questions or concerns related to her chronic conditions and high fall risk with provider Screening for signs and symptoms of depression related to chronic disease state Assessed social determinant of health barriers  Symptom Management: Take medications as prescribed   Attend all scheduled provider appointments Call provider office for new concerns or questions  call the Suicide and Crisis Lifeline: 988 call the [place] National Suicide Prevention Lifeline: 9080178315 or TTY: (239)437-2914 TTY [phone]) to talk to a trained counselor call 1-800-273-TALK (toll free, 24 hour hotline) if experiencing a Mental Health or [place]  Report new falls or safety concerns to the provider  Follow Up Plan: Telephone follow up appointment with care management team member scheduled for: 06-14-2022 at 230 pm       CCM Expected Outcome:  Monitor, Self-Manage and Reduce Symptoms of: HLD       Current Barriers:  Chronic Disease Management support and education needs related to effective management of HLD  Planned Interventions: Provider established cholesterol goals reviewed; Counseled on importance of regular laboratory monitoring as prescribed; Provided HLD educational materials; Reviewed role and  benefits of statin for ASCVD risk reduction; Discussed strategies to manage statin-induced myalgias; Reviewed importance of limiting foods high in cholesterol; Screening for signs and symptoms of depression related to chronic disease state;  Assessed social determinant of health barriers;   Symptom Management: Take medications as prescribed   Attend all scheduled provider appointments Attend church or other social activities Perform all self care activities independently  Perform IADL's (shopping, preparing meals, housekeeping, managing finances) independently Call provider office for new concerns or questions  Work with the social worker to address care coordination needs and will continue to work with the clinical team to address health care and disease management related needs call the Suicide and Crisis Lifeline: 988 call the [place] National Suicide Prevention Lifeline: 512-516-5562 or TTY: (703)705-9342 TTY 702-446-5936) to talk to a trained counselor call 1-800-273-TALK (toll free, 24 hour hotline) if experiencing a Mental Health or [place]  - call for medicine refill 2 or 3 days before it runs out - take all medications exactly as prescribed - call doctor with any symptoms you believe are related to your medicine - call doctor when you experience any new symptoms - go to all doctor appointments as scheduled - adhere to prescribed diet: heart healthy/ADA diet   Follow Up Plan: Telephone follow up appointment with care management team member scheduled for: 06-14-2022 at 230 pm          Patient verbalizes understanding of instructions and care plan provided today and agrees to view in [place]. Active MyChart status and patient understanding of how to access instructions and care plan via MyChart confirmed with patient.     Telephone follow up appointment with care management team member scheduled for: 06-14-2022 at 230 pm

## 2022-05-11 NOTE — Plan of Care (Signed)
Chronic Care Management Provider Comprehensive Care Plan    05/11/2022 Name: [name] [name] MRN: [id number] DOB: [birth year]  Referral to Chronic Care Management (CCM) services was placed by Provider:  Dr. Rochel Brome on Date: 04-08-2022.  Chronic Condition 1: HLD Provider Assessment and Plan N INDIVIDUAL CARE PLAN for hyperlipidemia/ cholesterol was established and reinforced today.  The patient's status was assessed using clinical findings on exam, lab and other diagnostic tests. The patient's disease status was assessed based on evidence-based guidelines and found to be fair controlled. MEDICATIONS were reviewed. SELF MANAGEMENT GOALS have been discussed and patient's success at attaining the goal of low cholesterol was assessed. RECOMMENDATION given include regular exercise 3 days a week and low cholesterol/low fat diet. CLINICAL SUMMARY including written plan to identify barriers unique to the patient due to social or economic  reasons was discussed.    Expected Outcome/Goals Addressed This Visit (Provider CCM goals/Provider Assessment and plan   Goal: CCM (HLD)  EXPECTED OUTCOME:  MONITOR, SELF- MANAGE AND REDUCE SYMPTOMS OF HLD  Symptom Management Condition 1: Take all medications as prescribed Attend all scheduled provider appointments Call provider office for new concerns or questions  call the Suicide and Crisis Lifeline: 988 call the [place] National Suicide Prevention Lifeline: 581-486-3059 or TTY: 650-277-4001 TTY [phone]) to talk to a trained counselor call 1-800-273-TALK (toll free, 24 hour hotline) if experiencing a Mental Health or [place]  call for medicine refill 2 or 3 days before it runs out take all medications exactly as prescribed call doctor with any symptoms you believe are related to your medicine call doctor when you experience any new symptoms go to all doctor appointments as scheduled adhere to prescribed diet: Heart healthy/ADA  diet   Chronic Condition 2: Depression  Provider Assessment and Plan avoid decongestants found in common cold remedies, decrease consumption of alcohol, perform routine monitoring of BP with home BP cuff, exercise, reduction of dietary salt, take medicines as prescribed, try not to miss doses and quit smoking. Regular exercise and maintaining a healthy weight is needed. Stress reduction may help.   Depression stable on diazepam and desryl   Expected Outcome/Goals Addressed This Visit (Provider CCM goals/Provider Assessment and plan   Goal: CCM (Depression)  EXPECTED OUTCOME:  MONITOR, SELF- MANAGE AND REDUCE SYMPTOMS OF Depression   Symptom Management Condition 2: Take all medications as prescribed Attend all scheduled provider appointments Call provider office for new concerns or questions  call the Suicide and Crisis Lifeline: 988 call the [place] National Suicide Prevention Lifeline: (270)740-3972 or TTY: 269-820-7773 TTY [phone]) to talk to a trained counselor call 1-800-273-TALK (toll free, 24 hour hotline) if experiencing a Mental Health or [place]   Chronic Condition 3: Falls prevention and safety Provider Assessment and Plan:  Home safety, evaluation, pain control   Expected Outcome/Goals Addressed This Visit (Provider CCM goals/Provider Assessment and plan   Goal: CCM   EXPECTED OUTCOME:  MONITOR, SELF- MANAGE AND REDUCE SYMPTOMS OF Preventable falls  Symptom Management Condition 3: Take all medications as prescribed Attend all scheduled provider appointments Call provider office for new concerns or questions  call the Suicide and Crisis Lifeline: 988 call the [place] National Suicide Prevention Lifeline: 850-386-7092 or TTY: 240 336 6667 TTY (478) 735-7207) to talk to a trained counselor call 1-800-273-TALK (toll free, 24 hour hotline) if experiencing a Mental Health or [place]  Report new falls or safety concerns  Problem  List Patient Active Problem List   Diagnosis Date  Noted   Cigarette smoker 02/22/2022   Anxiety 02/21/2022   Aortic atherosclerosis ([place]) 02/21/2022   Ascending aorta dilation ([place]) 02/21/2022   Asthma 02/21/2022   CAD (coronary artery disease) 02/21/2022   Depression 02/21/2022   GERD (gastroesophageal reflux disease) 02/21/2022   History of kidney stones 02/21/2022   Hypertension 02/21/2022   Lupus ([place]) 02/21/2022   Myocardial infarction ([place]) 02/21/2022   Paraesophageal hernia 02/21/2022   Presbyesophagus 02/21/2022   PSVT (paroxysmal supraventricular tachycardia) 02/21/2022   Schatzki's ring 02/21/2022   BMI 24.0-24.9, adult 02/14/2022   Sciatica 01/31/2022   Renal calculus 12/21/2021   S/P repair of paraesophageal hernia 02/22/2021   Coronary artery disease involving native coronary artery of native heart without angina pectoris 11/18/2020   Cataract 11/16/2020   Osteoarthritis    Enlarged aorta (HCC)    Depression, major, single episode, severe ([place]) 03/30/2020   PAC (premature atrial contraction) 02/03/2020   PAT (paroxysmal atrial tachycardia) 63/14/9702   Diastolic dysfunction 63/78/5885   Tobacco use 08/19/2019   Type 2 diabetes mellitus with other specified complication ([place]) 02/77/4128   Hyperlipidemia 08/07/2019   Essential hypertension 08/07/2019   COPD (chronic obstructive pulmonary disease) ([place]) 08/07/2019   GERD with esophagitis 08/07/2019   Degenerative joint disease involving multiple joints 09/28/2017   Long-term current use of opiate analgesic 06/25/2017   Pain of right hip joint 11/22/2015   Chronic left shoulder pain 09/27/2015   S/P total knee arthroplasty 02/15/2015   Primary osteoarthritis of left knee 02/04/2015    Medication Management  Current Outpatient Medications:    albuterol (PROVENTIL) (2.5 MG/3ML) 0.083% nebulizer solution, Take 3 mLs (2.5 mg total) by nebulization every 6 (six) hours as needed for wheezing or shortness of breath.,  Disp: 120 mL, Rfl: 6   aspirin EC 81 MG tablet, Take 1 tablet (81 mg total) by mouth daily. Swallow whole., Disp: 90 tablet, Rfl: 3   Budeson-Glycopyrrol-Formoterol 160-9-4.8 MCG/ACT AERO, Inhale 2 puffs into the lungs 2 (two) times daily as needed (shortness of breath)., Disp: 10.7 g, Rfl: 2   buPROPion (WELLBUTRIN XL) 150 MG 24 hr tablet, TAKE 1 TABLET(150 MG) BY MOUTH DAILY, Disp: 90 tablet, Rfl: 2   cloNIDine (CATAPRES) 0.1 MG tablet, TAKE ONE TABLET BY MOUTH EVERY MORNING and TAKE ONE TABLET BY MOUTH EVERYDAY AT BEDTIME, Disp: 180 tablet, Rfl: 3   diazepam (VALIUM) 5 MG tablet, TAKE ONE TABLET BY MOUTH EVERY TWELVE HOURS AS NEEDED FOR ANXIETY. watch FOR sedation, Disp: 60 tablet, Rfl: 3   ibuprofen (ADVIL) 800 MG tablet, TAKE ONE TABLET BY MOUTH every EIGHT hours AS NEEDED FOR moderate pain, Disp: 90 tablet, Rfl: 2   isosorbide mononitrate (IMDUR) 30 MG 24 hr tablet, Take 0.5 tablets (15 mg total) by mouth daily., Disp: 45 tablet, Rfl: 2   lisinopril-hydrochlorothiazide (ZESTORETIC) 20-12.5 MG tablet, TAKE TWO TABLETS BY MOUTH EVERY MORNING, Disp: 180 tablet, Rfl: 1   meclizine (ANTIVERT) 25 MG tablet, Take 1 tablet (25 mg total) by mouth 3 (three) times daily as needed for dizziness., Disp: 180 tablet, Rfl: 1   nitroGLYCERIN (NITROSTAT) 0.4 MG SL tablet, Place 0.4 mg under the tongue every 5 (five) minutes as needed for chest pain., Disp: , Rfl:    omeprazole (PRILOSEC) 40 MG capsule, TAKE ONE CAPSULE BY MOUTH EVERY MORNING, Disp: 90 capsule, Rfl: 1   oxyCODONE-acetaminophen (PERCOCET) 10-325 MG tablet, Take 1 tablet by mouth every 8 (eight) hours as needed for pain., Disp: 60 tablet, Rfl: 0   promethazine (PHENERGAN)  25 MG tablet, TAKE ONE TABLET BY MOUTH every SIX hours AS NEEDED FOR NAUSEA AND VOMITING, Disp: 30 tablet, Rfl: 3   rosuvastatin (CRESTOR) 10 MG tablet, TAKE ONE TABLET BY MOUTH EVERYDAY AT BEDTIME, Disp: 90 tablet, Rfl: 1   traZODone (DESYREL) 100 MG tablet, TAKE ONE TABLET BY  MOUTH AT bedtime, Disp: 90 tablet, Rfl: 1  Cognitive Assessment Identity Confirmed: : Name; DOB Cognitive Status: Normal   Functional Assessment Hearing Difficulty or Deaf: no Wear Glasses or Blind: yes Vision Management: wears glasses, needs to schedule an eye appointment Concentrating, Remembering or Making Decisions Difficulty (CP): yes Concentration Management: sometimes gets confused, does not forget medications Difficulty Communicating: no Difficulty Eating/Swallowing: no Walking or Climbing Stairs Difficulty: yes Walking or Climbing Stairs: ambulation difficulty, requires equipment Mobility Management: uses a cane or walker when ambulating Dressing/Bathing Difficulty: no Doing Errands Independently Difficulty (such as shopping) (CP): yes Errands Management: granddaughter helps with errands on her day off Change in Functional Status Since Onset of Current Illness/Injury: no   Caregiver Assessment  Primary Source of Support/Comfort: child(ren) Name of Support/Comfort Primary Source: Orland Penman- granddaughter People in Home: alone Family Caregiver if Needed: grandchild(ren), adult Family Caregiver Names: Orland Penman- granddaughter Primary Roles/Responsibilities: retired   Planned Interventions  Evaluation of current treatment plan related to depression  and patient's adherence to plan as established by provider Advised patient to call the office for changes in mood, anxiety, stress level, depression, or mental health needs Provided education to patient re: resources available for transportation needs, resources for effective management of pain and discomfort, resources for effective management of depression and ongoing support Reviewed medications with patient and discussed compliance. The patient states she lays her medications out each night to take in the morning. Says she forgets things sometimes but she does not forget her medications Provided patient with community  resources and other resources to help in management of her chronic conditions educational materials related to effective management of depression Reviewed scheduled/upcoming provider appointments including 06-20-2022 at 2 pm Care Guide referral for transportation needs Discussed plans with patient for ongoing care management follow up and provided patient with direct contact information for care management team Advised patient to discuss changes in her mood, depression, or mental health needs with provider Screening for signs and symptoms of depression related to chronic disease state  AsProvided  verbal education re: potential causes of falls and Fall prevention strategies Reviewed medications and discussed potential side effects of medications such as dizziness and frequent urination. The patient states she is compliant with medications. Does have dizziness at times. Review of changing position slowly and monitoring for drops in blood pressure Advised patient of importance of notifying provider of falls. Review and education provided Assessed for signs and symptoms of orthostatic hypotension. Review and education provided Assessed for falls since last encounter. Last fall was on 05-07-2022 per the patient, she did not notify the provider Assessed patients knowledge of fall risk prevention secondary to previously provided education Provided patient information for fall alert systems Assessed working status of life alert bracelet and patient adherence Advised patient to discuss changes in safety in her environment, new questions or concerns related to her chronic conditions and high fall risk with provider Screening for signs and symptoms of depression related to chronic disease state Assessed social determinant of health barriers Provider established cholesterol goals reviewed; Counseled on importance of regular laboratory monitoring as prescribed; Provided HLD educational materials; Reviewed  role and benefits of statin for ASCVD  risk reduction; Discussed strategies to manage statin-induced myalgias; Reviewed importance of limiting foods high in cholesterol; Screening for signs and symptoms of depression related to chronic disease state;  Assessed social determinant of health barriers;     Interaction and coordination with outside resources, practitioners, and providers See CCM Referral  Care Plan: Available in MyChart

## 2022-05-11 NOTE — Chronic Care Management (AMB) (Signed)
Chronic Care Management   CCM RN Visit Note  05/11/2022 Name: [name] [name] MRN: [id number] DOB: Aug 19, 1945  Subjective: [name] [name] is a 76 y.o. year old female who is a primary care patient of Cox, Kirsten, MD. The patient was referred to the Chronic Care Management team for assistance with care management needs subsequent to provider initiation of CCM services and plan of care.    Today's Visit:  Engaged with patient by telephone for initial visit.     SDOH Interventions Today    Flowsheet Row Most Recent Value  SDOH Interventions   Food Insecurity Interventions Intervention Not Indicated  Transportation Interventions Other (Comment)  [the patient states she is still having issues with transportation, [id number] referral pending]  Utilities Interventions Intervention Not Indicated  Alcohol Usage Interventions Intervention Not Indicated (Score <7)  Financial Strain Interventions Intervention Not Indicated  Physical Activity Interventions Other (Comments)  [no structured activity, the patient states that she does leg exercises and arm excercises when she feels like it, her pain limits her a lot]  Stress Interventions Other (Comment)  [concerned about upcoming injections for her back in January and also transportation needs]  Social Connections Interventions Other (Comment)  [the patient states she talks to her family a lot, her granddaughter helps with errands, sometimes her neighbor does]         Goals Addressed             This Visit's Progress    CCM Expected Outcome:  Monitor, Self-Manage and Reduce Symptoms of: Depression       Current Barriers:  Knowledge Deficits related to effective management of pain and the impact her pain level has on her depression and stress level Care Coordination needs related to pain control and resources to help with dealing with her depression  in a patient with depression and high stress level Chronic Disease Management support and education  needs related to effective management of depression  Lacks caregiver support.  Transportation barriers Grief and loss: Husband passed in 2014 and her son passed in 2022 of lung cancer   Planned Interventions: Evaluation of current treatment plan related to depression  and patient's adherence to plan as established by provider Advised patient to call the office for changes in mood, anxiety, stress level, depression, or mental health needs Provided education to patient re: resources available for transportation needs, resources for effective management of pain and discomfort, resources for effective management of depression and ongoing support Reviewed medications with patient and discussed compliance. The patient states she lays her medications out each night to take in the morning. Says she forgets things sometimes but she does not forget her medications Provided patient with community resources and other resources to help in management of her chronic conditions educational materials related to effective management of depression Reviewed scheduled/upcoming provider appointments including 06-20-2022 at 2 pm Care Guide referral for transportation needs Discussed plans with patient for ongoing care management follow up and provided patient with direct contact information for care management team Advised patient to discuss changes in her mood, depression, or mental health needs with provider Screening for signs and symptoms of depression related to chronic disease state  Assessed social determinant of health barriers  Symptom Management: Take medications as prescribed   Attend all scheduled provider appointments Call provider office for new concerns or questions  call the Suicide and Crisis Lifeline: 988 call the [place] National Suicide Prevention Lifeline: (743) 105-2843 or TTY: 219-739-1035 TTY (781) 078-6533) to talk to a  trained counselor call 1-800-273-TALK (toll free, 24 hour hotline) if  experiencing a Mental Health or [place]   Follow Up Plan: Telephone follow up appointment with care management team member scheduled for: 06-14-2022 at 230 pm       CCM Expected Outcome:  Monitor, Self-Manage and Reduce Symptoms of: Falls prevention and Safety       Current Barriers:  Knowledge Deficits related to fall prevention and safety in a patient with multiple chronic conditions Care Coordination needs related to transportation concerns in a patient with frequent falls and chronic pain  Chronic Disease Management support and education needs related to fall prevention and safety Lacks caregiver support.  Transportation barriers  Planned Interventions: Provided  verbal education re: potential causes of falls and Fall prevention strategies Reviewed medications and discussed potential side effects of medications such as dizziness and frequent urination. The patient states she is compliant with medications. Does have dizziness at times. Review of changing position slowly and monitoring for drops in blood pressure Advised patient of importance of notifying provider of falls. Review and education provided Assessed for signs and symptoms of orthostatic hypotension. Review and education provided Assessed for falls since last encounter. Last fall was on 05-07-2022 per the patient, she did not notify the provider Assessed patients knowledge of fall risk prevention secondary to previously provided education Provided patient information for fall alert systems Assessed working status of life alert bracelet and patient adherence Advised patient to discuss changes in safety in her environment, new questions or concerns related to her chronic conditions and high fall risk with provider Screening for signs and symptoms of depression related to chronic disease state Assessed social determinant of health barriers  Symptom Management: Take medications as prescribed   Attend all  scheduled provider appointments Call provider office for new concerns or questions  call the Suicide and Crisis Lifeline: 988 call the [place] National Suicide Prevention Lifeline: 4780187386 or TTY: 203 444 7768 TTY (343)203-6210) to talk to a trained counselor call 1-800-273-TALK (toll free, 24 hour hotline) if experiencing a Mental Health or [place]  Report new falls or safety concerns to the provider  Follow Up Plan: Telephone follow up appointment with care management team member scheduled for: 06-14-2022 at 230 pm       CCM Expected Outcome:  Monitor, Self-Manage and Reduce Symptoms of: HLD       Current Barriers:  Chronic Disease Management support and education needs related to effective management of HLD  Planned Interventions: Provider established cholesterol goals reviewed; Counseled on importance of regular laboratory monitoring as prescribed; Provided HLD educational materials; Reviewed role and benefits of statin for ASCVD risk reduction; Discussed strategies to manage statin-induced myalgias; Reviewed importance of limiting foods high in cholesterol; Screening for signs and symptoms of depression related to chronic disease state;  Assessed social determinant of health barriers;   Symptom Management: Take medications as prescribed   Attend all scheduled provider appointments Attend church or other social activities Perform all self care activities independently  Perform IADL's (shopping, preparing meals, housekeeping, managing finances) independently Call provider office for new concerns or questions  Work with the social worker to address care coordination needs and will continue to work with the clinical team to address health care and disease management related needs call the Suicide and Crisis Lifeline: 988 call the [place] National Suicide Prevention Lifeline: 607-811-8525 or TTY: (684)150-9886 TTY 646-113-4952) to talk to a trained counselor call  1-800-273-TALK (toll free, 24 hour hotline) if experiencing  a Mental Health or [place]  - call for medicine refill 2 or 3 days before it runs out - take all medications exactly as prescribed - call doctor with any symptoms you believe are related to your medicine - call doctor when you experience any new symptoms - go to all doctor appointments as scheduled - adhere to prescribed diet: heart healthy/ADA diet   Follow Up Plan: Telephone follow up appointment with care management team member scheduled for: 06-14-2022 at 230 pm          Plan:Telephone follow up appointment with care management team member scheduled for:  06-14-2022 at 70 pm  [place], MSN, CCM RN Care Manager  Chronic Care Management Direct Number: [phone]

## 2022-05-15 ENCOUNTER — Other Ambulatory Visit: Payer: Self-pay

## 2022-05-15 ENCOUNTER — Telehealth: Payer: Self-pay

## 2022-05-15 MED ORDER — VITAMIN D (ERGOCALCIFEROL) 1.25 MG (50000 UNIT) PO CAPS: 50000.0000 [IU] | ORAL_CAPSULE | ORAL | 0 refills | Status: DC

## 2022-05-15 NOTE — Progress Notes (Signed)
Chronic Care Management Pharmacy Assistant   Name: [name] [name]  MRN: [id number] DOB: 07/27/1945   Reason for Encounter: Medication Coordination for [place]    Recent office visits:  None  Recent consult visits:  None  Hospital visits:  None  Medications: Outpatient Encounter Medications as of 05/15/2022  Medication Sig   albuterol (PROVENTIL) (2.5 MG/3ML) 0.083% nebulizer solution Take 3 mLs (2.5 mg total) by nebulization every 6 (six) hours as needed for wheezing or shortness of breath.   aspirin EC 81 MG tablet Take 1 tablet (81 mg total) by mouth daily. Swallow whole.   Budeson-Glycopyrrol-Formoterol 160-9-4.8 MCG/ACT AERO Inhale 2 puffs into the lungs 2 (two) times daily as needed (shortness of breath).   buPROPion (WELLBUTRIN XL) 150 MG 24 hr tablet TAKE 1 TABLET(150 MG) BY MOUTH DAILY   cloNIDine (CATAPRES) 0.1 MG tablet TAKE ONE TABLET BY MOUTH EVERY MORNING and TAKE ONE TABLET BY MOUTH EVERYDAY AT BEDTIME   diazepam (VALIUM) 5 MG tablet TAKE ONE TABLET BY MOUTH EVERY TWELVE HOURS AS NEEDED FOR ANXIETY. watch FOR sedation   ibuprofen (ADVIL) 800 MG tablet TAKE ONE TABLET BY MOUTH every EIGHT hours AS NEEDED FOR moderate pain   isosorbide mononitrate (IMDUR) 30 MG 24 hr tablet Take 0.5 tablets (15 mg total) by mouth daily.   lisinopril-hydrochlorothiazide (ZESTORETIC) 20-12.5 MG tablet TAKE TWO TABLETS BY MOUTH EVERY MORNING   meclizine (ANTIVERT) 25 MG tablet Take 1 tablet (25 mg total) by mouth 3 (three) times daily as needed for dizziness.   nitroGLYCERIN (NITROSTAT) 0.4 MG SL tablet Place 0.4 mg under the tongue every 5 (five) minutes as needed for chest pain.   omeprazole (PRILOSEC) 40 MG capsule TAKE ONE CAPSULE BY MOUTH EVERY MORNING   oxyCODONE-acetaminophen (PERCOCET) 10-325 MG tablet Take 1 tablet by mouth every 8 (eight) hours as needed for pain.   promethazine (PHENERGAN) 25 MG tablet TAKE ONE TABLET BY MOUTH every SIX hours AS NEEDED FOR NAUSEA AND VOMITING    rosuvastatin (CRESTOR) 10 MG tablet TAKE ONE TABLET BY MOUTH EVERYDAY AT BEDTIME   traZODone (DESYREL) 100 MG tablet TAKE ONE TABLET BY MOUTH AT bedtime   No facility-administered encounter medications on file as of 05/15/2022.    Reviewed chart for medication changes ahead of medication coordination call.  No OVs, Consults, or hospital visits since last care coordination call/Pharmacist visit.   No medication changes indicated OR if recent visit, treatment plan here.  BP Readings from Last 3 Encounters:  02/22/22 124/60  02/14/22 (!) 90/50  01/31/22 130/60    Lab Results  Component Value Date   HGBA1C 5.2 02/14/2022     Patient obtains medications through Adherence Packaging  30 Days   Last adherence delivery included: Isosorbide Monoitrate 30 mg 0.5 tablet at Breakfast  Rosuvastatin 10 mg 1 at bedtime Clonidine 0.1 mg 1 at breakfast and 1 at Bedtime Bupropion XL 150 mg 1 at breakfast Trazodone 100 mg 1 at bedtime prn (Vials) Lisinopril-HCTZ 20-12.5 mg 2 at breakfast Omeprazole 40 mg 1 at breakfast  Meclizine '25mg'$ - 1 tablet 3 times daily as needed  Phenergan '25mg'$ - Sent a script request   Patient declined (meds) last month Ibuprofen '800mg'$ - Has plenty of supply Breztri Inhaler   Patient is due for next adherence delivery on: 05/26/22. Called patient and reviewed medications and coordinated delivery.  This delivery to include: Isosorbide Monoitrate 30 mg 0.5 tablet at Breakfast  Rosuvastatin 10 mg 1 at bedtime Clonidine 0.1 mg 1 at breakfast  and 1 at Bedtime Bupropion XL 150 mg 1 at breakfast Trazodone 100 mg 1 at bedtime prn (Vials) Lisinopril-HCTZ 20-12.5 mg 2 at breakfast Omeprazole 40 mg 1 at breakfast  Phenergan '25mg'$ - Sent a script request  Diazepam '5mg'$ -1 tab every 12 hours as needed   Patient declined the following medications  Ibuprofen '800mg'$ - Has plenty of supply Breztri Inhaler-Plenty of supply, prn Asprin '81mg'$ - OTC Meclizine '25mg'$ -Prn, has enough  supply Nitroglycerin 0.4-Prn, plenty of supply  Patient needs refills  None  Confirmed delivery date of 05/26/22, advised patient that pharmacy will contact them the morning of delivery.   Elray Mcgregor, [place] Pharmacist Assistant  [phone]

## 2022-05-15 NOTE — Telephone Encounter (Addendum)
-----   Message from Rochel Brome, MD sent at 04/02/2022  1:30 PM EST ----- Regarding: DEXA NEEDS VITAMIN D 50K WEEKLY.  ORDERING DEXA IF PATIENT AGREES.    Patient agrees to vitamin D weekly, rx sent and she had a DEXA in 10/2020. It is scanned in chart.

## 2022-05-16 ENCOUNTER — Telehealth: Payer: Self-pay | Admitting: *Deleted

## 2022-05-16 NOTE — Telephone Encounter (Signed)
   Telephone encounter was:  Successful.  05/16/2022 Name: [name] [name] MRN: [id number] DOB: 1945-11-04  [name] [name] is a 75 y.o. year old female who is a primary care patient of Cox, Kirsten, MD . The community resource team was consulted for assistance with Transportation Needs  [employer] care medicaid transportation and wanted to [place]  to provide [place] transportaion will send [facility] brochure to provide away for her to follow up with continued issues  Care guide performed the following interventions: Patient provided with information about care guide support team and interviewed to confirm resource needs.  Follow Up Plan:  No further follow up planned at this time. The patient has been provided with needed resources. [facility] [phone] [address] , [place] [address] Email : [email or web address]

## 2022-05-28 DIAGNOSIS — E785 Hyperlipidemia, unspecified: Secondary | ICD-10-CM | POA: Diagnosis not present

## 2022-05-28 DIAGNOSIS — F32A Depression, unspecified: Secondary | ICD-10-CM | POA: Diagnosis not present

## 2022-06-01 DIAGNOSIS — M5416 Radiculopathy, lumbar region: Secondary | ICD-10-CM | POA: Diagnosis not present

## 2022-06-05 ENCOUNTER — Other Ambulatory Visit: Payer: Self-pay | Admitting: Pain Medicine

## 2022-06-05 DIAGNOSIS — M5416 Radiculopathy, lumbar region: Secondary | ICD-10-CM

## 2022-06-14 ENCOUNTER — Telehealth: Payer: 59

## 2022-06-14 ENCOUNTER — Ambulatory Visit: Payer: Self-pay

## 2022-06-14 ENCOUNTER — Ambulatory Visit (INDEPENDENT_AMBULATORY_CARE_PROVIDER_SITE_OTHER): Payer: 59

## 2022-06-14 DIAGNOSIS — E782 Mixed hyperlipidemia: Secondary | ICD-10-CM

## 2022-06-14 DIAGNOSIS — F322 Major depressive disorder, single episode, severe without psychotic features: Secondary | ICD-10-CM

## 2022-06-14 DIAGNOSIS — R296 Repeated falls: Secondary | ICD-10-CM

## 2022-06-14 NOTE — Chronic Care Management (AMB) (Signed)
Error in charting- 2 open charts

## 2022-06-14 NOTE — Patient Instructions (Signed)
Please call the care guide team at [phone] if you need to cancel or reschedule your appointment.   If you are experiencing a Mental Health or [place] or need someone to talk to, please call the Suicide and Crisis Lifeline: 988 call the [place] National Suicide Prevention Lifeline: 830-883-0891 or TTY: (306) 867-3175 TTY (787)207-6265) to talk to a trained counselor call 1-800-273-TALK (toll free, 24 hour hotline)   Following is a copy of the CCM Program Consent:  CCM service includes personalized support from designated clinical staff supervised by the physician, including individualized plan of care and coordination with other care providers 24/7 contact phone numbers for assistance for urgent and routine care needs. Service will only be billed when office clinical staff spend 20 minutes or more in a month to coordinate care. Only one practitioner may furnish and bill the service in a calendar month. The patient may stop CCM services at amy time (effective at the end of the month) by phone call to the office staff. The patient will be responsible for cost sharing (co-pay) or up to 20% of the service fee (after annual deductible is met)  Following is a copy of your full provider care plan:   Goals Addressed             This Visit's Progress    CCM Expected Outcome:  Monitor, Self-Manage and Reduce Symptoms of: Depression       Current Barriers:  Knowledge Deficits related to effective management of pain and the impact her pain level has on her depression and stress level Care Coordination needs related to pain control and resources to help with dealing with her depression  in a patient with depression and high stress level Chronic Disease Management support and education needs related to effective management of depression  Lacks caregiver support.  Transportation barriers Grief and loss: Husband passed in 2014 and her son passed in 2022 of lung cancer   Planned  Interventions: Evaluation of current treatment plan related to depression  and patient's adherence to plan as established by provider. The patient is doing well with her depression over all. The biggest thing is the pain she lives with daily. She says she also has noticed more swelling in her feet and legs. The patient states she doesn't have it in the am but by night they are really swollen. Review of elevation of feet when sitting down, use of compression socks, and discussing with the pcp at upcoming appointment to see the pcp.  Advised patient to call the office for changes in mood, anxiety, stress level, depression, or mental health needs Provided education to patient re: resources available for transportation needs, resources for effective management of pain and discomfort, resources for effective management of depression and ongoing support Reviewed medications with patient and discussed compliance. The patient states she lays her medications out each night to take in the morning. Says she forgets things sometimes but she does not forget her medications Provided patient with community resources and other resources to help in management of her chronic conditions educational materials related to effective management of depression Reviewed scheduled/upcoming provider appointments including 06-26-2022 at 11 am Care Guide referral for transportation needs. Talked with the care guide and got numbers for transportation. She states her neighbor is going to take her to her appointment on 06-26-2022 Discussed plans with patient for ongoing care management follow up and provided patient with direct contact information for care management team Advised patient to discuss changes in her  mood, depression, or mental health needs with provider Screening for signs and symptoms of depression related to chronic disease state  Assessed social determinant of health barriers  Symptom Management: Take medications as  prescribed   Attend all scheduled provider appointments Call provider office for new concerns or questions  call the Suicide and Crisis Lifeline: 988 call the [place] National Suicide Prevention Lifeline: 212-588-3165 or TTY: 604 593 8019 TTY (419)166-4991) to talk to a trained counselor call 1-800-273-TALK (toll free, 24 hour hotline) if experiencing a Mental Health or [place]   Follow Up Plan: Telephone follow up appointment with care management team member scheduled for: 08-03-2022 at 230 pm       CCM Expected Outcome:  Monitor, Self-Manage and Reduce Symptoms of: Falls prevention and Safety       Current Barriers:  Knowledge Deficits related to fall prevention and safety in a patient with multiple chronic conditions Care Coordination needs related to transportation concerns in a patient with frequent falls and chronic pain  Chronic Disease Management support and education needs related to fall prevention and safety Lacks caregiver support.  Transportation barriers  Planned Interventions: Provided  verbal education re: potential causes of falls and Fall prevention strategies Reviewed medications and discussed potential side effects of medications such as dizziness and frequent urination. The patient states she is compliant with medications. Does have dizziness at times. Review of changing position slowly and monitoring for drops in blood pressure. The patient is changing position slowly. She states she has had a couple of incidences of dizziness but takes her medications and lays down until it subsides.  Advised patient of importance of notifying provider of falls. Review and education provided Assessed for signs and symptoms of orthostatic hypotension. Review and education provided Assessed for falls since last encounter. Last fall was on 05-07-2022. The patient denies any new falls. States she is being careful. Assessed patients knowledge of fall risk prevention secondary  to previously provided education. Review and education provided.  Provided patient information for fall alert systems Assessed working status of life alert bracelet and patient adherence Advised patient to discuss changes in safety in her environment, new questions or concerns related to her chronic conditions and high fall risk with provider Screening for signs and symptoms of depression related to chronic disease state Assessed social determinant of health barriers  Symptom Management: Take medications as prescribed   Attend all scheduled provider appointments Call provider office for new concerns or questions  call the Suicide and Crisis Lifeline: 988 call the [place] National Suicide Prevention Lifeline: (517)037-5138 or TTY: 651-661-2332 TTY 604 493 1854) to talk to a trained counselor call 1-800-273-TALK (toll free, 24 hour hotline) if experiencing a Mental Health or [place]  Report new falls or safety concerns to the provider  Follow Up Plan: Telephone follow up appointment with care management team member scheduled for: 08-03-2022 at 230 pm       CCM Expected Outcome:  Monitor, Self-Manage and Reduce Symptoms of: HLD       Current Barriers:  Chronic Disease Management support and education needs related to effective management of HLD Lab Results  Component Value Date   CHOL 164 02/14/2022   HDL 67 02/14/2022   LDLCALC 87 02/14/2022   TRIG 45 02/14/2022   CHOLHDL 2.4 02/14/2022     Planned Interventions: Provider established cholesterol goals reviewed. The patient is at goal; Counseled on importance of regular laboratory monitoring as prescribed. The patient has regular lab work Provided HLD educational  materials; Reviewed role and benefits of statin for ASCVD risk reduction; Discussed strategies to manage statin-induced myalgias. The patient takes rosuvastatin 10 mg daily. The patient has chronic pain but does not have pain related to statins; Reviewed  importance of limiting foods high in cholesterol. Review of heart healthy diet. The patient states she is eating but since having her hernia surgery she has lost weight and does not have a good appetite. She stays hydrated. Denies any issues with dietary restrictions. ; Screening for signs and symptoms of depression related to chronic disease state;  Assessed social determinant of health barriers;   Symptom Management: Take medications as prescribed   Attend all scheduled provider appointments Attend church or other social activities Perform all self care activities independently  Perform IADL's (shopping, preparing meals, housekeeping, managing finances) independently Call provider office for new concerns or questions  Work with the social worker to address care coordination needs and will continue to work with the clinical team to address health care and disease management related needs call the Suicide and Crisis Lifeline: 988 call the [place] National Suicide Prevention Lifeline: (585)702-0244 or TTY: (289)807-2931 TTY (774)504-6240) to talk to a trained counselor call 1-800-273-TALK (toll free, 24 hour hotline) if experiencing a Mental Health or [place]  - call for medicine refill 2 or 3 days before it runs out - take all medications exactly as prescribed - call doctor with any symptoms you believe are related to your medicine - call doctor when you experience any new symptoms - go to all doctor appointments as scheduled - adhere to prescribed diet: heart healthy/ADA diet   Follow Up Plan: Telephone follow up appointment with care management team member scheduled for: 08-03-2022 at 230 pm          Patient verbalizes understanding of instructions and care plan provided today and agrees to view in [place]. Active MyChart status and patient understanding of how to access instructions and care plan via MyChart confirmed with patient.     Telephone follow up appointment  with care management team member scheduled for: 08-03-2022 at 230 pm

## 2022-06-14 NOTE — Chronic Care Management (AMB) (Signed)
Chronic Care Management   CCM RN Visit Note  06/14/2022 Name: [name] [name] MRN: [id number] DOB: 1945-11-16  Subjective: [name] [name] is a 77 y.o. year old female who is a primary care patient of Cox, Kirsten, MD. The patient was referred to the Chronic Care Management team for assistance with care management needs subsequent to provider initiation of CCM services and plan of care.    Today's Visit:  Engaged with patient by telephone for follow up visit.        Goals Addressed             This Visit's Progress    CCM Expected Outcome:  Monitor, Self-Manage and Reduce Symptoms of: Depression       Current Barriers:  Knowledge Deficits related to effective management of pain and the impact her pain level has on her depression and stress level Care Coordination needs related to pain control and resources to help with dealing with her depression  in a patient with depression and high stress level Chronic Disease Management support and education needs related to effective management of depression  Lacks caregiver support.  Transportation barriers Grief and loss: Husband passed in 2014 and her son passed in 2022 of lung cancer   Planned Interventions: Evaluation of current treatment plan related to depression  and patient's adherence to plan as established by provider. The patient is doing well with her depression over all. The biggest thing is the pain she lives with daily. She says she also has noticed more swelling in her feet and legs. The patient states she doesn't have it in the am but by night they are really swollen. Review of elevation of feet when sitting down, use of compression socks, and discussing with the pcp at upcoming appointment to see the pcp.  Advised patient to call the office for changes in mood, anxiety, stress level, depression, or mental health needs Provided education to patient re: resources available for transportation needs, resources for effective management of  pain and discomfort, resources for effective management of depression and ongoing support Reviewed medications with patient and discussed compliance. The patient states she lays her medications out each night to take in the morning. Says she forgets things sometimes but she does not forget her medications Provided patient with community resources and other resources to help in management of her chronic conditions educational materials related to effective management of depression Reviewed scheduled/upcoming provider appointments including 06-26-2022 at 11 am Care Guide referral for transportation needs. Talked with the care guide and got numbers for transportation. She states her neighbor is going to take her to her appointment on 06-26-2022 Discussed plans with patient for ongoing care management follow up and provided patient with direct contact information for care management team Advised patient to discuss changes in her mood, depression, or mental health needs with provider Screening for signs and symptoms of depression related to chronic disease state  Assessed social determinant of health barriers  Symptom Management: Take medications as prescribed   Attend all scheduled provider appointments Call provider office for new concerns or questions  call the Suicide and Crisis Lifeline: 988 call the [place] National Suicide Prevention Lifeline: (867)112-3913 or TTY: 317-806-2239 TTY 405-831-2197) to talk to a trained counselor call 1-800-273-TALK (toll free, 24 hour hotline) if experiencing a Mental Health or [place]   Follow Up Plan: Telephone follow up appointment with care management team member scheduled for: 08-03-2022 at 230 pm       CCM  Expected Outcome:  Monitor, Self-Manage and Reduce Symptoms of: Falls prevention and Safety       Current Barriers:  Knowledge Deficits related to fall prevention and safety in a patient with multiple chronic conditions Care Coordination  needs related to transportation concerns in a patient with frequent falls and chronic pain  Chronic Disease Management support and education needs related to fall prevention and safety Lacks caregiver support.  Transportation barriers  Planned Interventions: Provided  verbal education re: potential causes of falls and Fall prevention strategies Reviewed medications and discussed potential side effects of medications such as dizziness and frequent urination. The patient states she is compliant with medications. Does have dizziness at times. Review of changing position slowly and monitoring for drops in blood pressure. The patient is changing position slowly. She states she has had a couple of incidences of dizziness but takes her medications and lays down until it subsides.  Advised patient of importance of notifying provider of falls. Review and education provided Assessed for signs and symptoms of orthostatic hypotension. Review and education provided Assessed for falls since last encounter. Last fall was on 05-07-2022. The patient denies any new falls. States she is being careful. Assessed patients knowledge of fall risk prevention secondary to previously provided education. Review and education provided.  Provided patient information for fall alert systems Assessed working status of life alert bracelet and patient adherence Advised patient to discuss changes in safety in her environment, new questions or concerns related to her chronic conditions and high fall risk with provider Screening for signs and symptoms of depression related to chronic disease state Assessed social determinant of health barriers  Symptom Management: Take medications as prescribed   Attend all scheduled provider appointments Call provider office for new concerns or questions  call the Suicide and Crisis Lifeline: 988 call the [place] National Suicide Prevention Lifeline: 4072290784 or TTY: (734)258-2415 TTY  (754) 675-0869) to talk to a trained counselor call 1-800-273-TALK (toll free, 24 hour hotline) if experiencing a Mental Health or [place]  Report new falls or safety concerns to the provider  Follow Up Plan: Telephone follow up appointment with care management team member scheduled for: 08-03-2022 at 230 pm       CCM Expected Outcome:  Monitor, Self-Manage and Reduce Symptoms of: HLD       Current Barriers:  Chronic Disease Management support and education needs related to effective management of HLD Lab Results  Component Value Date   CHOL 164 02/14/2022   HDL 67 02/14/2022   LDLCALC 87 02/14/2022   TRIG 45 02/14/2022   CHOLHDL 2.4 02/14/2022     Planned Interventions: Provider established cholesterol goals reviewed. The patient is at goal; Counseled on importance of regular laboratory monitoring as prescribed. The patient has regular lab work Provided HLD Scientist, clinical (histocompatibility and immunogenetics); Reviewed role and benefits of statin for ASCVD risk reduction; Discussed strategies to manage statin-induced myalgias. The patient takes rosuvastatin 10 mg daily. The patient has chronic pain but does not have pain related to statins; Reviewed importance of limiting foods high in cholesterol. Review of heart healthy diet. The patient states she is eating but since having her hernia surgery she has lost weight and does not have a good appetite. She stays hydrated. Denies any issues with dietary restrictions. ; Screening for signs and symptoms of depression related to chronic disease state;  Assessed social determinant of health barriers;   Symptom Management: Take medications as prescribed   Attend all scheduled provider appointments Attend church  or other social activities Perform all self care activities independently  Perform IADL's (shopping, preparing meals, housekeeping, managing finances) independently Call provider office for new concerns or questions  Work with the social worker  to address care coordination needs and will continue to work with the clinical team to address health care and disease management related needs call the Suicide and Crisis Lifeline: 988 call the [place] National Suicide Prevention Lifeline: 262 402 5838 or TTY: 254-554-5543 TTY 2144084559) to talk to a trained counselor call 1-800-273-TALK (toll free, 24 hour hotline) if experiencing a Mental Health or [place]  - call for medicine refill 2 or 3 days before it runs out - take all medications exactly as prescribed - call doctor with any symptoms you believe are related to your medicine - call doctor when you experience any new symptoms - go to all doctor appointments as scheduled - adhere to prescribed diet: heart healthy/ADA diet   Follow Up Plan: Telephone follow up appointment with care management team member scheduled for: 08-03-2022 at 230 pm          Plan:Telephone follow up appointment with care management team member scheduled for:  08-03-2022 at 230 pm  [place], MSN, CCM RN Care Manager  Chronic Care Management Direct Number: [phone]

## 2022-06-15 ENCOUNTER — Telehealth: Payer: Self-pay

## 2022-06-15 NOTE — Progress Notes (Signed)
Care Management & Coordination Services Pharmacy Team  Reason for Encounter: Medication coordination and delivery  Contacted patient on 06/15/2022 to discuss medications   Recent office visits:  05/15/22 Philipp Ovens CMA. Orders Only. Ordered Vitamin D 1.'25mg'$ .   Recent consult visits:  None  Hospital visits:  None  Medications: Outpatient Encounter Medications as of 06/15/2022  Medication Sig   albuterol (PROVENTIL) (2.5 MG/3ML) 0.083% nebulizer solution Take 3 mLs (2.5 mg total) by nebulization every 6 (six) hours as needed for wheezing or shortness of breath.   aspirin EC 81 MG tablet Take 1 tablet (81 mg total) by mouth daily. Swallow whole.   Budeson-Glycopyrrol-Formoterol 160-9-4.8 MCG/ACT AERO Inhale 2 puffs into the lungs 2 (two) times daily as needed (shortness of breath).   buPROPion (WELLBUTRIN XL) 150 MG 24 hr tablet TAKE 1 TABLET(150 MG) BY MOUTH DAILY   cloNIDine (CATAPRES) 0.1 MG tablet TAKE ONE TABLET BY MOUTH EVERY MORNING and TAKE ONE TABLET BY MOUTH EVERYDAY AT BEDTIME   diazepam (VALIUM) 5 MG tablet TAKE ONE TABLET BY MOUTH EVERY TWELVE HOURS AS NEEDED FOR ANXIETY. watch FOR sedation   ibuprofen (ADVIL) 800 MG tablet TAKE ONE TABLET BY MOUTH every EIGHT hours AS NEEDED FOR moderate pain   isosorbide mononitrate (IMDUR) 30 MG 24 hr tablet Take 0.5 tablets (15 mg total) by mouth daily.   lisinopril-hydrochlorothiazide (ZESTORETIC) 20-12.5 MG tablet TAKE TWO TABLETS BY MOUTH EVERY MORNING   meclizine (ANTIVERT) 25 MG tablet Take 1 tablet (25 mg total) by mouth 3 (three) times daily as needed for dizziness.   nitroGLYCERIN (NITROSTAT) 0.4 MG SL tablet Place 0.4 mg under the tongue every 5 (five) minutes as needed for chest pain.   omeprazole (PRILOSEC) 40 MG capsule TAKE ONE CAPSULE BY MOUTH EVERY MORNING   oxyCODONE-acetaminophen (PERCOCET) 10-325 MG tablet Take 1 tablet by mouth every 8 (eight) hours as needed for pain.   promethazine (PHENERGAN) 25 MG tablet TAKE ONE  TABLET BY MOUTH every SIX hours AS NEEDED FOR NAUSEA AND VOMITING   rosuvastatin (CRESTOR) 10 MG tablet TAKE ONE TABLET BY MOUTH EVERYDAY AT BEDTIME   traZODone (DESYREL) 100 MG tablet TAKE ONE TABLET BY MOUTH AT bedtime   Vitamin D, Ergocalciferol, (DRISDOL) 1.25 MG (50000 UNIT) CAPS capsule Take 1 capsule (50,000 Units total) by mouth every 7 (seven) days.   No facility-administered encounter medications on file as of 06/15/2022.   BP Readings from Last 3 Encounters:  02/22/22 124/60  02/14/22 (!) 90/50  01/31/22 130/60    Pulse Readings from Last 3 Encounters:  02/22/22 63  02/14/22 77  01/31/22 88    Lab Results  Component Value Date/Time   HGBA1C 5.2 02/14/2022 03:20 PM   HGBA1C 5.3 10/12/2021 04:57 PM   Lab Results  Component Value Date   CREATININE 0.71 02/14/2022   BUN 9 02/14/2022   GFRNONAA >60 10/15/2021   GFRAA 100 07/14/2020   NA 134 02/14/2022   K 3.5 02/14/2022   CALCIUM 9.3 02/14/2022   CO2 24 02/14/2022     Last adherence delivery date:05/26/22      Patient is due for next adherence delivery on: 06/27/22  Spoke with patient on 06/16/22 reviewed medications and coordinated delivery.  This delivery to include: Adherence Packaging  30 Days  Isosorbide Monoitrate 30 mg 0.5 tablet at Breakfast  Rosuvastatin 10 mg 1 at bedtime Clonidine 0.1 mg 1 at breakfast and 1 at Bedtime Bupropion XL 150 mg 1 at breakfast Trazodone 100 mg 1 at bedtime  prn (Vials) Lisinopril-HCTZ 20-12.5 mg 2 at breakfast Omeprazole 40 mg 1 at breakfast  Meclizine '25mg'$ -1 tab three times daily as needed  Phenergan '25mg'$ - 1 tablet every six hours prn (bottles) Diazepam '5mg'$ -1 tab every 12 hours as needed Ibuprofen '800mg'$ -! tablet every EIGHT hours AS NEEDED  Breztri Inhaler 160-9-4.51mg-Inhale 2 puffs into the lungs 2 (two) times daily prn  Patient declined the following medications this month: Asprin '81mg'$ - OTC Nitroglycerin 0.4-Prn, plenty of supply  Refills requested from providers  include: Meclizine '25mg'$   Delivery scheduled for 06/27/22. Unable to speak with patient to confirm date.   Any concerns about your medications? No  How often do you forget or accidentally miss a dose? Never  Do you use a pillbox? No  Is patient in packaging Yes  Recent blood pressure readings are as follows:02/22/22 124/60  Recent blood glucose readings are as follows:02/14/22 5.2   Cycle dispensing form sent to [place] Constablefor review.   DOvidio HangerGerringer

## 2022-06-19 ENCOUNTER — Other Ambulatory Visit: Payer: Self-pay

## 2022-06-19 DIAGNOSIS — R42 Dizziness and giddiness: Secondary | ICD-10-CM

## 2022-06-19 MED ORDER — MECLIZINE HCL 25 MG PO TABS: 25.0000 mg | ORAL_TABLET | Freq: Three times a day (TID) | ORAL | 1 refills | Status: DC | PRN

## 2022-06-19 NOTE — Telephone Encounter (Signed)
Compliant on meds 

## 2022-06-20 ENCOUNTER — Ambulatory Visit: Payer: Medicare Other | Admitting: Family Medicine

## 2022-06-26 ENCOUNTER — Ambulatory Visit (INDEPENDENT_AMBULATORY_CARE_PROVIDER_SITE_OTHER): Payer: 59 | Admitting: Family Medicine

## 2022-06-26 ENCOUNTER — Encounter: Payer: Self-pay | Admitting: Family Medicine

## 2022-06-26 VITALS — BP 124/62 | HR 62 | Temp 97.3°F | Ht 62.0 in | Wt 132.0 lb

## 2022-06-26 DIAGNOSIS — E782 Mixed hyperlipidemia: Secondary | ICD-10-CM | POA: Diagnosis not present

## 2022-06-26 DIAGNOSIS — J410 Simple chronic bronchitis: Secondary | ICD-10-CM | POA: Diagnosis not present

## 2022-06-26 DIAGNOSIS — I2510 Atherosclerotic heart disease of native coronary artery without angina pectoris: Secondary | ICD-10-CM

## 2022-06-26 DIAGNOSIS — M5416 Radiculopathy, lumbar region: Secondary | ICD-10-CM | POA: Diagnosis not present

## 2022-06-26 DIAGNOSIS — F17210 Nicotine dependence, cigarettes, uncomplicated: Secondary | ICD-10-CM | POA: Diagnosis not present

## 2022-06-26 DIAGNOSIS — K219 Gastro-esophageal reflux disease without esophagitis: Secondary | ICD-10-CM

## 2022-06-26 DIAGNOSIS — I700 Atherosclerosis of aorta: Secondary | ICD-10-CM

## 2022-06-26 DIAGNOSIS — F330 Major depressive disorder, recurrent, mild: Secondary | ICD-10-CM

## 2022-06-26 DIAGNOSIS — M48061 Spinal stenosis, lumbar region without neurogenic claudication: Secondary | ICD-10-CM | POA: Diagnosis not present

## 2022-06-26 MED ORDER — GABAPENTIN 100 MG PO CAPS: 100.0000 mg | ORAL_CAPSULE | Freq: Three times a day (TID) | ORAL | 1 refills | Status: DC

## 2022-06-26 NOTE — Progress Notes (Signed)
Subjective:  Patient ID: [name] [name], female    DOB: 1945/08/22  Age: 77 y.o. MRN: [id number]  Chief Complaint  Patient presents with   Hyperlipidemia   Hypertension    HPI Hyperlipidemia.   Medications: Rosuvastatin 10 mg daily without problems.   Hypertension.   Medications: Lisinopril-Hctz 20-12.5 mg 2 tablets every morning, Clonidine 0.1 twice a day , aspirin 81 mg daily well without side effects.   COPD: Breztri 2 puffs twice daily, Albuterol nebulizer  2.5 mg every 6 hours, albuterol inhaler 2 puffs twice a day. 1 ppd. Decreased from 2 ppd.   GERD: Omeprazole 40 mg daily still gets GERD and uses every day.   CORONARY ARTERY DISEASE: non obstructive CORONARY ARTERY DISEASE. occasional pain and takes ntg x 1 and resolves. Patient sees Dr. Geraldo Pitter.  On rosuvastatin. 10 mg before bed.   Depression/GAD: Wellbutrin xl 150 mg one in am. Patient intolerant to zoloft.  Takes diazepam 5 mg once daily for panic attacks.  Trazodone 100 mg before bed for insomnia.   Chronic pain syndrome:  Mid thoracic back pain and radiates to lumbar region and then goes around to the sides.  Patient saw [place] [facility]. Abnormal lumbar MRI. They recommended cspine mri.  Taking ibuprofen 800 mg three times a day. No tylenol.  9/10 daily.  Previously seen by Dr. Nelva Bush for pain clinic.  History of Diabetes. No longer on any medicine. Decreased appetite.   Patient is requesting a PCS due to her chronic pain. Has difficulty with cleaning, cooking, and bathing.      06/26/2022   11:09 AM 02/14/2022    2:55 PM 11/03/2021   11:29 AM 08/11/2021   11:14 AM 05/05/2021    2:40 PM  Depression screen PHQ 2/9  Decreased Interest 0 '1 1 1 1  '$ Down, Depressed, Hopeless '1 2 1 3 2  '$ PHQ - 2 Score '1 3 2 4 3  '$ Altered sleeping '3 2 3 3 1  '$ Tired, decreased energy '3 3 3 3 3  '$ Change in appetite '2 3 3 3 2  '$ Feeling bad or failure about yourself  0 2 0 3 1  Trouble concentrating '2 2 3 3 3  '$ Moving slowly or  fidgety/restless '1 2 1 3 3  '$ Suicidal thoughts 0 0 0 0 0  PHQ-9 Score '12 17 15 22 16  '$ Difficult doing work/chores Not difficult at all Somewhat difficult Not difficult at all Very difficult Somewhat difficult         10/15/2021   10:23 AM 02/20/2022    2:03 PM 05/11/2022    1:35 PM 06/26/2022   11:09 AM 06/26/2022   11:48 AM  Fall Risk  Falls in the past year?  '1 1 1   '$ Was there an injury with Fall?  0 1 1   Was there an injury with Fall? - Comments     fractured a rib. contusion of right knee.  Fall Risk Category Calculator  '2 3 3   '$ Fall Risk Category (Retired)  Moderate High    (RETIRED) Patient Fall Risk Level Low fall risk  High fall risk    Patient at Risk for Falls Due to   History of fall(s);Impaired balance/gait;Impaired mobility;Orthopedic patient;Other (Comment) History of fall(s);Impaired balance/gait   Patient at Risk for Falls Due to - Comments   chronic back pain and hip pain    Fall risk Follow up   Falls evaluation completed;Education provided;Falls prevention discussed;Follow up appointment Falls evaluation completed;Education provided  Review of Systems  Constitutional:  Negative for appetite change, fatigue and fever.  HENT:  Negative for congestion, ear pain, sinus pressure and sore throat.   Respiratory:  Positive for chest tightness. Negative for cough, shortness of breath and wheezing.   Cardiovascular:  Negative for chest pain and palpitations.  Gastrointestinal:  Negative for abdominal pain, constipation, diarrhea, nausea and vomiting.  Genitourinary:  Negative for dysuria and hematuria.  Musculoskeletal:  Positive for myalgias (Left knee pain). Negative for arthralgias, back pain and joint swelling.  Skin:  Negative for rash.  Neurological:  Positive for dizziness (Chronic Vertigo) and headaches (Patient states atleast 2 per day.). Negative for weakness.  Psychiatric/Behavioral:  Negative for dysphoric mood. The patient is not nervous/anxious.      Current Outpatient Medications on File Prior to Visit  Medication Sig Dispense Refill   albuterol (PROVENTIL) (2.5 MG/3ML) 0.083% nebulizer solution Take 3 mLs (2.5 mg total) by nebulization every 6 (six) hours as needed for wheezing or shortness of breath. 120 mL 6   aspirin EC 81 MG tablet Take 1 tablet (81 mg total) by mouth daily. Swallow whole. 90 tablet 3   Budeson-Glycopyrrol-Formoterol 160-9-4.8 MCG/ACT AERO Inhale 2 puffs into the lungs 2 (two) times daily as needed (shortness of breath). 10.7 g 2   buPROPion (WELLBUTRIN XL) 150 MG 24 hr tablet TAKE 1 TABLET(150 MG) BY MOUTH DAILY 90 tablet 2   cloNIDine (CATAPRES) 0.1 MG tablet TAKE ONE TABLET BY MOUTH EVERY MORNING and TAKE ONE TABLET BY MOUTH EVERYDAY AT BEDTIME 180 tablet 3   diazepam (VALIUM) 5 MG tablet TAKE ONE TABLET BY MOUTH EVERY TWELVE HOURS AS NEEDED FOR ANXIETY. watch FOR sedation 60 tablet 3   ibuprofen (ADVIL) 800 MG tablet TAKE ONE TABLET BY MOUTH every EIGHT hours AS NEEDED FOR moderate pain 90 tablet 2   isosorbide mononitrate (IMDUR) 30 MG 24 hr tablet Take 0.5 tablets (15 mg total) by mouth daily. 45 tablet 2   lisinopril-hydrochlorothiazide (ZESTORETIC) 20-12.5 MG tablet TAKE TWO TABLETS BY MOUTH EVERY MORNING 180 tablet 1   meclizine (ANTIVERT) 25 MG tablet Take 1 tablet (25 mg total) by mouth 3 (three) times daily as needed for dizziness. 180 tablet 1   nitroGLYCERIN (NITROSTAT) 0.4 MG SL tablet Place 0.4 mg under the tongue every 5 (five) minutes as needed for chest pain.     omeprazole (PRILOSEC) 40 MG capsule TAKE ONE CAPSULE BY MOUTH EVERY MORNING 90 capsule 1   promethazine (PHENERGAN) 25 MG tablet TAKE ONE TABLET BY MOUTH every SIX hours AS NEEDED FOR NAUSEA AND VOMITING 30 tablet 3   rosuvastatin (CRESTOR) 10 MG tablet TAKE ONE TABLET BY MOUTH EVERYDAY AT BEDTIME 90 tablet 1   traZODone (DESYREL) 100 MG tablet TAKE ONE TABLET BY MOUTH AT bedtime 90 tablet 1   Vitamin D, Ergocalciferol, (DRISDOL) 1.25 MG  (50000 UNIT) CAPS capsule Take 1 capsule (50,000 Units total) by mouth every 7 (seven) days. 12 capsule 0   No current facility-administered medications on file prior to visit.   Past Medical History:  Diagnosis Date   Anxiety    Aortic atherosclerosis ([place])    Ascending aorta dilation ([place])    a.) TTE 09/02/19 --> mild; measured 21m.   Asthma    BMI 24.0-24.9, adult 02/14/2022   CAD (coronary artery disease)    a,) CTA 08/24/20 --> mild CAD (25-49%) in RCA, LAD, LCx; CAC/Agatston score 509.   Cataract    Chronic left shoulder pain 09/27/2015  COPD (chronic obstructive pulmonary disease) (HCC)    Coronary artery disease involving native coronary artery of native heart without angina pectoris 11/18/2020   Degenerative joint disease involving multiple joints 09/28/2017   Depression    Depression, major, single episode, severe ([place]) 61/01/5092   Diastolic dysfunction    a.) TTE 09/02/19 --> LVEF 60-65%; G2DD. b.) TTE 07/23/20 --> LVEF 70-75%; G1DD.   Enlarged aorta (HCC)    Essential hypertension 08/07/2019   GERD (gastroesophageal reflux disease)    GERD with esophagitis 08/07/2019   History of kidney stones    Hyperlipidemia    Hypertension    Long-term current use of opiate analgesic 06/25/2017   Lupus ([place])    Myocardial infarction ([place])    Osteoarthritis    PAC (premature atrial contraction)    a.) Holter 12/04/19 --> occassional; 1.1% PAC burden.   Pain of right hip joint 11/22/2015   Paraesophageal hernia    PAT (paroxysmal atrial tachycardia) 02/03/2020   Presbyesophagus    Primary osteoarthritis of left knee 02/04/2015   PSVT (paroxysmal supraventricular tachycardia)    a.) Holter 12/04/19 --> 22 runs.   Renal calculus 12/21/2021   S/P repair of paraesophageal hernia 02/22/2021   S/P total knee arthroplasty 02/15/2015   Schatzki's ring    Sciatica 01/31/2022   Tobacco use 08/19/2019   Type 2 diabetes mellitus with other specified complication ([place]) 2/67/1245   Past Surgical  History:  Procedure Laterality Date   ABDOMINAL HYSTERECTOMY     BLADDER SUSPENSION     BREAST BIOPSY Right    COLONOSCOPY  09/04/2011   Colonic polyps, status post polyectomy. Incidental small ascending colon lipoma   ESOPHAGOGASTRODUODENOSCOPY  09/08/2015   Schatzki ring status post esophageal dilitation. Small hiatal hernia   hemorrhoid surgery     KNEE SURGERY     left   TOTAL KNEE ARTHROPLASTY Left 02/15/2015   Procedure: TOTAL KNEE ARTHROPLASTY;  Surgeon: Vickey Huger, MD;  Location: [facility];  Service: Orthopedics;  Laterality: Left;   TUBAL LIGATION     WRIST SURGERY     right   XI ROBOTIC ASSISTED PARAESOPHAGEAL HERNIA REPAIR N/A 02/22/2021   Procedure: XI ROBOTIC ASSISTED PARAESOPHAGEAL HERNIA REPAIR with RNFA to assist;  Surgeon: Jules Husbands, MD;  Location: [facility];  Service: General;  Laterality: N/A;    Family History  Problem Relation Age of Onset   Stroke Mother    Heart disease Mother    COPD Father    Cancer Father        Bone   Diabetes Father    Heart disease Father    Stroke Father    Lupus Sister    Seizures Son    COPD Son    Colon cancer Neg Hx    Esophageal cancer Neg Hx    Stomach cancer Neg Hx    Rectal cancer Neg Hx    Social History   Socioeconomic History   Marital status: Widowed    Spouse name: Not on file   Number of children: 4   Years of education: Not on file   Highest education level: Not on file  Occupational History   Occupation: Retired  Tobacco Use   Smoking status: Every Day    Packs/day: 1.00    Years: 47.00    Total pack years: 47.00    Types: Cigarettes   Smokeless tobacco: Never   Tobacco comments:    Smokes 1 pack daily 01/04/21 [facility]   Vaping Use  Vaping Use: Never used  Substance and Sexual Activity   Alcohol use: No   Drug use: No   Sexual activity: Not Currently  Other Topics Concern   Not on file  Social History Narrative   Not on file   Social Determinants of Health   Financial Resource Strain: Low  Risk  (05/11/2022)   Overall Financial Resource Strain (CARDIA)    Difficulty of Paying Living Expenses: Not hard at all  Food Insecurity: No Food Insecurity (05/11/2022)   Hunger Vital Sign    Worried About Running Out of Food in the Last Year: Never true    Ran Out of Food in the Last Year: Never true  Transportation Needs: Unmet Transportation Needs (05/11/2022)   PRAPARE - Transportation    Lack of Transportation (Medical): Yes    Lack of Transportation (Non-Medical): Yes  Physical Activity: Inactive (05/11/2022)   Exercise Vital Sign    Days of Exercise per Week: 0 days    Minutes of Exercise per Session: 0 min  Stress: Stress Concern Present (05/11/2022)   [place]    Feeling of Stress : To some extent  Social Connections: Socially Isolated (05/11/2022)   Social Connection and Isolation Panel [NHANES]    Frequency of Communication with Friends and Family: More than three times a week    Frequency of Social Gatherings with Friends and Family: Twice a week    Attends Religious Services: Never    Marine scientist or Organizations: No    Attends Archivist Meetings: Never    Marital Status: Widowed    Objective:  BP 124/62 (BP Location: Left Arm, Patient Position: Sitting)   Pulse 62   Temp (!) 97.3 F (36.3 C) (Temporal)   Ht '5\' 2"'$  (1.575 m)   Wt 132 lb (59.9 kg)   SpO2 99%   BMI 24.14 kg/m      06/26/2022   11:16 AM 02/22/2022    3:05 PM 02/14/2022    2:21 PM  BP/Weight  Systolic BP 003 491 90  Diastolic BP 62 60 50  Wt. (Lbs) 132 138 134  BMI 24.14 kg/m2 25.24 kg/m2 24.51 kg/m2    Physical Exam Vitals reviewed.  Constitutional:      Appearance: Normal appearance. She is normal weight.  Cardiovascular:     Rate and Rhythm: Normal rate and regular rhythm.     Heart sounds: Normal heart sounds.  Pulmonary:     Effort: Pulmonary effort is normal.     Breath sounds: Normal  breath sounds.  Abdominal:     General: Abdomen is flat. Bowel sounds are normal.     Palpations: Abdomen is soft.     Tenderness: There is no abdominal tenderness.  Musculoskeletal:        General: Tenderness (mid lumbar.) present.  Neurological:     Mental Status: She is alert and oriented to person, place, and time.  Psychiatric:        Mood and Affect: Mood normal.        Behavior: Behavior normal.     Diabetic Foot Exam - Simple   No data filed      Lab Results  Component Value Date   WBC 6.8 06/26/2022   HGB 13.6 06/26/2022   HCT 41.0 06/26/2022   PLT 213 06/26/2022   GLUCOSE 81 06/26/2022   CHOL 177 06/26/2022   TRIG 57 06/26/2022   HDL 69 06/26/2022   [place]  97 06/26/2022   ALT 11 06/26/2022   AST 14 06/26/2022   NA 138 06/26/2022   K 4.4 06/26/2022   CL 99 06/26/2022   CREATININE 0.66 06/26/2022   BUN 10 06/26/2022   CO2 27 06/26/2022   TSH 1.690 06/26/2022   INR 1.02 01/28/2015   HGBA1C 5.2 02/14/2022      Assessment & Plan:    Aortic atherosclerosis (HCC) Assessment & Plan: Continue rosuvastatin 10 mg before bed and aspirin 81 mg daily.    Simple chronic bronchitis ([place]) Assessment & Plan: Well controlled.  Continue Breztri 2 puffs twice daily, Albuterol nebulizer  2.5 mg every 6 hours, albuterol inhaler 2 puffs twice a day.  Recommend cessation.    Cigarette smoker Assessment & Plan: Decreased from 2 ppd to 1 ppd. Encouragement given. Recommend cessation.   Coronary artery disease involving native coronary artery of native heart without angina pectoris Assessment & Plan: Stable Continue crestor, aspirin, ntg.    Depression, major, recurrent, mild ([place]) Assessment & Plan: Fairly well controlled.  Continue Wellbutrin xl 150 mg one in am.  Patient intolerant to zoloft.  Takes diazepam 5 mg once daily for panic attacks.  Trazodone 100 mg before bed for insomnia.    Gastroesophageal reflux disease without esophagitis Assessment &  Plan: The current medical regimen is effective;  continue present plan and medications. Continue omeprazole 40 mg daily.    Mixed hyperlipidemia Assessment & Plan: Continue rosuvastatin 10 mg before bed.  Recommend continue to work on eating healthy diet and exercise.   Orders: -     CBC with Differential/Platelet -     Comprehensive metabolic panel -     Lipid panel -     TSH  Lumbar radiculopathy Assessment & Plan: Start on gabapentin 100 mg one at night x 1 day, then increase to twice daily x 1 day, and then increase to three times a day.   Orders: -     Gabapentin; Take 1 capsule (100 mg total) by mouth 3 (three) times daily.  Dispense: 90 capsule; Refill: 1  Spinal stenosis of lumbar region, unspecified whether neurogenic claudication present Assessment & Plan: Start on gabapentin 100 mg one at night x 1 day, then increase to twice daily x 1 day, and then increase to three times a day.   Orders: -     Gabapentin; Take 1 capsule (100 mg total) by mouth 3 (three) times daily.  Dispense: 90 capsule; Refill: 1  Other orders -     Cardiovascular Risk Assessment     Meds ordered this encounter  Medications   gabapentin (NEURONTIN) 100 MG capsule    Sig: Take 1 capsule (100 mg total) by mouth 3 (three) times daily.    Dispense:  90 capsule    Refill:  1    Orders Placed This Encounter  Procedures   CBC with Differential/Platelet   Comprehensive metabolic panel   Lipid panel   TSH   Cardiovascular Risk Assessment     Follow-up: Return in about 3 months (around 09/25/2022) for chronic fasting.  An After Visit Summary was printed and given to the patient.    I,Lauren M Auman,acting as a scribe for Rochel Brome, MD.,have documented all relevant documentation on the behalf of Rochel Brome, MD,as directed by  Rochel Brome, MD while in the presence of Rochel Brome, MD.   Rochel Brome, MD [place] [phone]

## 2022-06-26 NOTE — Patient Instructions (Signed)
Start on gabapentin 100 mg one at night x 1 day, then increase to twice daily x 1 day, and then increase to three times a day.

## 2022-06-27 LAB — CARDIOVASCULAR RISK ASSESSMENT

## 2022-06-27 LAB — LIPID PANEL
Chol/HDL Ratio: 2.6 ratio (ref 0.0–4.4)
Cholesterol, Total: 177 mg/dL (ref 100–199)
HDL: 69 mg/dL (ref >39)
LDL Chol Calc (NIH): 97 mg/dL (ref 0–99)
Triglycerides: 57 mg/dL (ref 0–149)
VLDL Cholesterol Cal: 11 mg/dL (ref 5–40)

## 2022-06-27 LAB — CBC WITH DIFFERENTIAL/PLATELET
Basophils Absolute: 0.1 10*3/uL (ref 0.0–0.2)
Basos: 1 %
EOS (ABSOLUTE): 0.1 10*3/uL (ref 0.0–0.4)
Eos: 2 %
Hematocrit: 41 % (ref 34.0–46.6)
Hemoglobin: 13.6 g/dL (ref 11.1–15.9)
Immature Grans (Abs): 0 10*3/uL (ref 0.0–0.1)
Immature Granulocytes: 0 %
Lymphocytes Absolute: 1.9 10*3/uL (ref 0.7–3.1)
Lymphs: 28 %
MCH: 31.9 pg (ref 26.6–33.0)
MCHC: 33.2 g/dL (ref 31.5–35.7)
MCV: 96 fL (ref 79–97)
Monocytes Absolute: 0.7 10*3/uL (ref 0.1–0.9)
Monocytes: 10 %
Neutrophils Absolute: 4.1 10*3/uL (ref 1.4–7.0)
Neutrophils: 59 %
Platelets: 213 10*3/uL (ref 150–450)
RBC: 4.27 x10E6/uL (ref 3.77–5.28)
RDW: 12.4 % (ref 11.7–15.4)
WBC: 6.8 10*3/uL (ref 3.4–10.8)

## 2022-06-27 LAB — COMPREHENSIVE METABOLIC PANEL
ALT: 11 IU/L (ref 0–32)
AST: 14 IU/L (ref 0–40)
Albumin/Globulin Ratio: 2 (ref 1.2–2.2)
Albumin: 4.2 g/dL (ref 3.8–4.8)
Alkaline Phosphatase: 85 IU/L (ref 44–121)
BUN/Creatinine Ratio: 15 (ref 12–28)
BUN: 10 mg/dL (ref 8–27)
Bilirubin Total: 0.4 mg/dL (ref 0.0–1.2)
CO2: 27 mmol/L (ref 20–29)
Calcium: 9.6 mg/dL (ref 8.7–10.3)
Chloride: 99 mmol/L (ref 96–106)
Creatinine, Ser: 0.66 mg/dL (ref 0.57–1.00)
Globulin, Total: 2.1 g/dL (ref 1.5–4.5)
Glucose: 81 mg/dL (ref 70–99)
Potassium: 4.4 mmol/L (ref 3.5–5.2)
Sodium: 138 mmol/L (ref 134–144)
Total Protein: 6.3 g/dL (ref 6.0–8.5)
eGFR: 91 mL/min/{1.73_m2} (ref >59)

## 2022-06-27 LAB — TSH: TSH: 1.69 u[IU]/mL (ref 0.450–4.500)

## 2022-06-28 DIAGNOSIS — F32A Depression, unspecified: Secondary | ICD-10-CM

## 2022-06-28 DIAGNOSIS — E785 Hyperlipidemia, unspecified: Secondary | ICD-10-CM

## 2022-07-01 DIAGNOSIS — M48061 Spinal stenosis, lumbar region without neurogenic claudication: Secondary | ICD-10-CM | POA: Insufficient documentation

## 2022-07-01 DIAGNOSIS — M5416 Radiculopathy, lumbar region: Secondary | ICD-10-CM | POA: Insufficient documentation

## 2022-07-01 HISTORY — DX: Radiculopathy, lumbar region: M54.16

## 2022-07-01 HISTORY — DX: Spinal stenosis, lumbar region without neurogenic claudication: M48.061

## 2022-07-01 NOTE — Assessment & Plan Note (Signed)
>>  ASSESSMENT AND PLAN FOR GERD (GASTROESOPHAGEAL REFLUX DISEASE) WRITTEN ON 07/01/2022  9:02 PM BY COX, KIRSTEN, MD  The current medical regimen is effective;  continue present plan and medications. Continue omeprazole 40 mg daily.

## 2022-07-01 NOTE — Assessment & Plan Note (Addendum)
Continue rosuvastatin 10 mg before bed and aspirin 81 mg daily.

## 2022-07-01 NOTE — Assessment & Plan Note (Signed)
Fairly well controlled.  Continue Wellbutrin xl 150 mg one in am.  Patient intolerant to zoloft.  Takes diazepam 5 mg once daily for panic attacks.  Trazodone 100 mg before bed for insomnia.

## 2022-07-01 NOTE — Assessment & Plan Note (Signed)
Start on gabapentin 100 mg one at night x 1 day, then increase to twice daily x 1 day, and then increase to three times a day.

## 2022-07-01 NOTE — Assessment & Plan Note (Signed)
The current medical regimen is effective;  continue present plan and medications. Continue omeprazole 40 mg daily  

## 2022-07-01 NOTE — Assessment & Plan Note (Addendum)
Decreased from 2 ppd to 1 ppd. Encouragement given. Recommend cessation.

## 2022-07-01 NOTE — Assessment & Plan Note (Signed)
Stable Continue crestor, aspirin, ntg.

## 2022-07-01 NOTE — Assessment & Plan Note (Signed)
Continue rosuvastatin 10 mg before bed.  Recommend continue to work on eating healthy diet and exercise.  

## 2022-07-01 NOTE — Assessment & Plan Note (Signed)
Well controlled.  Continue Breztri 2 puffs twice daily, Albuterol nebulizer  2.5 mg every 6 hours, albuterol inhaler 2 puffs twice a day.  Recommend cessation.

## 2022-07-05 DIAGNOSIS — Z0279 Encounter for issue of other medical certificate: Secondary | ICD-10-CM

## 2022-07-12 ENCOUNTER — Other Ambulatory Visit: Payer: 59

## 2022-07-14 ENCOUNTER — Telehealth: Payer: Self-pay

## 2022-07-14 NOTE — Progress Notes (Signed)
Care Management & Coordination Services Pharmacy Team  Reason for Encounter: Medication coordination and delivery  Contacted patient to discuss medications and coordinate delivery from [facility]. Spoke with patient on 07/14/2022   Cycle dispensing form sent to Pattricia Boss  for review.   Last adherence delivery date:06/27/22      Patient is due for next adherence delivery on: 07/26/22  This delivery to include: Adherence Packaging  30 Days  Isosorbide Monoitrate 30 mg 0.5 tablet at Breakfast  Rosuvastatin 10 mg 1 at bedtime Clonidine 0.1 mg 1 at breakfast and 1 at Bedtime Bupropion XL 150 mg 1 at breakfast Trazodone 100 mg 1 at bedtime prn (Vials) Lisinopril-HCTZ 20-12.5 mg 2 at breakfast Omeprazole 40 mg 1 at breakfast  Meclizine 47m-1 tab three times daily as needed     Phenergan 240m 1 tablet every six hours prn (bottles) Diazepam 39m39m tab every 12 hours as needed Ibuprofen 800m51mtablet every EIGHT hours AS NEEDED  Breztri Inhaler 160-9-4.8mcg8mhale 2 puffs into the lungs 2 (two) times daily prn  Patient declined the following medications this month: Asprin 81mg-60m Nitroglycerin 0.4-Prn, plenty of supply  No refill request needed.  Confirmed delivery date of 07/26/22, advised patient that pharmacy will contact them the morning of delivery.   Any concerns about your medications? No  How often do you forget or accidentally miss a dose? Never  Do you use a pillbox? No  Is patient in packaging Yes  Any concerns or issues with your packaging? No concerns    Recent blood pressure readings are as follows:06/26/22 124/62   Chart review: Recent office visits:  06/26/22 Cox, KRochel Bromeeen for routine visit. Started on Gabapentin 100mg. 19mcent consult visits:  None  Hospital visits:  None  Medications: Outpatient Encounter Medications as of 07/14/2022  Medication Sig   albuterol (PROVENTIL) (2.5 MG/3ML) 0.083% nebulizer solution Take 3 mLs (2.5 mg  total) by nebulization every 6 (six) hours as needed for wheezing or shortness of breath.   aspirin EC 81 MG tablet Take 1 tablet (81 mg total) by mouth daily. Swallow whole.   Budeson-Glycopyrrol-Formoterol 160-9-4.8 MCG/ACT AERO Inhale 2 puffs into the lungs 2 (two) times daily as needed (shortness of breath).   buPROPion (WELLBUTRIN XL) 150 MG 24 hr tablet TAKE 1 TABLET(150 MG) BY MOUTH DAILY   cloNIDine (CATAPRES) 0.1 MG tablet TAKE ONE TABLET BY MOUTH EVERY MORNING and TAKE ONE TABLET BY MOUTH EVERYDAY AT BEDTIME   diazepam (VALIUM) 5 MG tablet TAKE ONE TABLET BY MOUTH EVERY TWELVE HOURS AS NEEDED FOR ANXIETY. watch FOR sedation   gabapentin (NEURONTIN) 100 MG capsule Take 1 capsule (100 mg total) by mouth 3 (three) times daily.   ibuprofen (ADVIL) 800 MG tablet TAKE ONE TABLET BY MOUTH every EIGHT hours AS NEEDED FOR moderate pain   isosorbide mononitrate (IMDUR) 30 MG 24 hr tablet Take 0.5 tablets (15 mg total) by mouth daily.   lisinopril-hydrochlorothiazide (ZESTORETIC) 20-12.5 MG tablet TAKE TWO TABLETS BY MOUTH EVERY MORNING   meclizine (ANTIVERT) 25 MG tablet Take 1 tablet (25 mg total) by mouth 3 (three) times daily as needed for dizziness.   nitroGLYCERIN (NITROSTAT) 0.4 MG SL tablet Place 0.4 mg under the tongue every 5 (five) minutes as needed for chest pain.   omeprazole (PRILOSEC) 40 MG capsule TAKE ONE CAPSULE BY MOUTH EVERY MORNING   promethazine (PHENERGAN) 25 MG tablet TAKE ONE TABLET BY MOUTH every SIX hours AS NEEDED FOR NAUSEA AND VOMITING  rosuvastatin (CRESTOR) 10 MG tablet TAKE ONE TABLET BY MOUTH EVERYDAY AT BEDTIME   traZODone (DESYREL) 100 MG tablet TAKE ONE TABLET BY MOUTH AT bedtime   Vitamin D, Ergocalciferol, (DRISDOL) 1.25 MG (50000 UNIT) CAPS capsule Take 1 capsule (50,000 Units total) by mouth every 7 (seven) days.   No facility-administered encounter medications on file as of 07/14/2022.   BP Readings from Last 3 Encounters:  06/26/22 124/62  02/22/22  124/60  02/14/22 (!) 90/50    Pulse Readings from Last 3 Encounters:  06/26/22 62  02/22/22 63  02/14/22 77    Lab Results  Component Value Date/Time   HGBA1C 5.2 02/14/2022 03:20 PM   HGBA1C 5.3 10/12/2021 04:57 PM   Lab Results  Component Value Date   CREATININE 0.66 06/26/2022   BUN 10 06/26/2022   GFRNONAA >60 10/15/2021   GFRAA 100 07/14/2020   NA 138 06/26/2022   K 4.4 06/26/2022   CALCIUM 9.6 06/26/2022   CO2 27 06/26/2022     Elray Mcgregor, [place] Clinical Pharmacist Assistant  [phone]

## 2022-07-25 ENCOUNTER — Telehealth: Payer: Self-pay

## 2022-07-25 NOTE — Telephone Encounter (Signed)
[facility] to schedule their annual wellness visit. Appointment made for 08/01/22.  Norton Blizzard, [place] ([facility])  [facility] [phone]

## 2022-08-01 ENCOUNTER — Ambulatory Visit (INDEPENDENT_AMBULATORY_CARE_PROVIDER_SITE_OTHER): Payer: 59

## 2022-08-01 DIAGNOSIS — Z87891 Personal history of nicotine dependence: Secondary | ICD-10-CM

## 2022-08-01 DIAGNOSIS — Z0000 Encounter for general adult medical examination without abnormal findings: Secondary | ICD-10-CM | POA: Diagnosis not present

## 2022-08-01 NOTE — Patient Instructions (Signed)
[name] , Thank you for taking time to come for your Medicare Wellness Visit. I appreciate your ongoing commitment to your health goals. Please review the following plan we discussed and let me know if I can assist you in the future.   Screening recommendations/referrals: Mammogram: Declined Bone Density: Declined Recommended yearly ophthalmology/optometry visit for glaucoma screening and checkup Recommended yearly dental visit for hygiene and checkup  Vaccinations: Influenza vaccine: up-to-date Pneumococcal vaccine: Declined Tdap vaccine: Declined Shingles vaccine: Declined     Preventive Care 77 Years and Older, Female   Preventive care refers to lifestyle choices and visits with your health care provider that can promote health and wellness.  What does preventive care include? A yearly physical exam. This is also called an annual well check. Dental exams once or twice a year. Routine eye exams. Ask your health care provider how often you should have your eyes checked. Personal lifestyle choices, including: Daily care of your teeth and gums. Regular physical activity. Eating a healthy diet. Avoiding tobacco and drug use. Limiting alcohol use. Practicing safe sex. Taking low-dose aspirin every day. Taking vitamin and mineral supplements as recommended by your health care provider.  What happens during an annual well check? The services and screenings done by your health care provider during your annual well check will depend on your age, overall health, lifestyle risk factors, and family history of disease.  Counseling Your health care provider may ask you questions about your: Alcohol use. Tobacco use. Drug use. Emotional well-being. Home and relationship well-being. Sexual activity. Eating habits. History of falls. Memory and ability to understand (cognition). Work and work Statistician. Reproductive health.  Screening You may have the following tests or  measurements: Height, weight, and BMI. Blood pressure. Lipid and cholesterol levels. These may be checked every 5 years, or more frequently if you are over 12 years old. Skin check. Lung cancer screening. You may have this screening every year starting at age 77 if you have a 30-pack-year history of smoking and currently smoke or have quit within the past 15 years. Fecal occult blood test (FOBT) of the stool. You may have this test every year starting at age 30. Flexible sigmoidoscopy or colonoscopy. You may have a sigmoidoscopy every 5 years or a colonoscopy every 10 years starting at age 77. Hepatitis C blood test. Hepatitis B blood test. Sexually transmitted disease (STD) testing. Diabetes screening. This is done by checking your blood sugar (glucose) after you have not eaten for a while (fasting). You may have this done every 1-3 years. Bone density scan. This is done to screen for osteoporosis. You may have this done starting at age 77. Mammogram. This may be done every 1-2 years. Talk to your health care provider about how often you should have regular mammograms. Talk with your health care provider about your test results, treatment options, and if necessary, the need for more tests.  Vaccines Your health care provider may recommend certain vaccines, such as: Influenza vaccine. This is recommended every year. Tetanus, diphtheria, and acellular pertussis (Tdap, Td) vaccine. You may need a Td booster every 10 years. Zoster vaccine. You may need this after age 77. Pneumococcal 13-valent conjugate (PCV13) vaccine. One dose is recommended after age 77. Pneumococcal polysaccharide (PPSV23) vaccine. One dose is recommended after age 77. Talk to your health care provider about which screenings and vaccines you need and how often you need them.  This information is not intended to replace advice given to you by your  health care provider. Make sure you discuss any questions you have with your  health care provider. Document Released: 06/11/2015 Document Revised: 02/02/2016 Document Reviewed: 03/16/2015 Elsevier Interactive Patient Education  2017 [employer] Prevention in the Home  Falls can cause injuries. They can happen to people of all ages. There are many things you can do to make your home safe and to help prevent falls.  What can I do on the outside of my home? Regularly fix the edges of walkways and driveways and fix any cracks. Remove anything that might make you trip as you walk through a door, such as a raised step or threshold. Trim any bushes or trees on the path to your home. Use bright outdoor lighting. Clear any walking paths of anything that might make someone trip, such as rocks or tools. Regularly check to see if handrails are loose or broken. Make sure that both sides of any steps have handrails. Any raised decks and porches should have guardrails on the edges. Have any leaves, snow, or ice cleared regularly. Use sand or salt on walking paths during winter. Clean up any spills in your garage right away. This includes oil or grease spills.  What can I do in the bathroom? Use night lights. Install grab bars by the toilet and in the tub and shower. Do not use towel bars as grab bars. Use non-skid mats or decals in the tub or shower. If you need to sit down in the shower, use a plastic, non-slip stool. Keep the floor dry. Clean up any water that spills on the floor as soon as it happens. Remove soap buildup in the tub or shower regularly. Attach bath mats securely with double-sided non-slip rug tape. Do not have throw rugs and other things on the floor that can make you trip.  What can I do in the bedroom? Use night lights. Make sure that you have a light by your bed that is easy to reach. Do not use any sheets or blankets that are too big for your bed. They should not hang down onto the floor. Have a firm chair that has side arms. You can use  this for support while you get dressed. Do not have throw rugs and other things on the floor that can make you trip.  What can I do in the kitchen? Clean up any spills right away. Avoid walking on wet floors. Keep items that you use a lot in easy-to-reach places. If you need to reach something above you, use a strong step stool that has a grab bar. Keep electrical cords out of the way. Do not use floor polish or wax that makes floors slippery. If you must use wax, use non-skid floor wax. Do not have throw rugs and other things on the floor that can make you trip.  What can I do with my stairs? Do not leave any items on the stairs. Make sure that there are handrails on both sides of the stairs and use them. Fix handrails that are broken or loose. Make sure that handrails are as long as the stairways. Check any carpeting to make sure that it is firmly attached to the stairs. Fix any carpet that is loose or worn. Avoid having throw rugs at the top or bottom of the stairs. If you do have throw rugs, attach them to the floor with carpet tape. Make sure that you have a light switch at the top of the stairs and the  bottom of the stairs. If you do not have them, ask someone to add them for you.  What else can I do to help prevent falls? Wear shoes that: Do not have high heels. Have rubber bottoms. Are comfortable and fit you well. Are closed at the toe. Do not wear sandals. If you use a stepladder: Make sure that it is fully opened. Do not climb a closed stepladder. Make sure that both sides of the stepladder are locked into place. Ask someone to hold it for you, if possible. Clearly mark and make sure that you can see: Any grab bars or handrails. First and last steps. Where the edge of each step is. Use tools that help you move around (mobility aids) if they are needed. These include: Canes. Walkers. Scooters. Crutches. Turn on the lights when you go into a dark area. Replace any light  bulbs as soon as they burn out. Set up your furniture so you have a clear path. Avoid moving your furniture around. If any of your floors are uneven, fix them. If there are any pets around you, be aware of where they are. Review your medicines with your doctor. Some medicines can make you feel dizzy. This can increase your chance of falling. Ask your doctor what other things that you can do to help prevent falls.  This information is not intended to replace advice given to you by your health care provider. Make sure you discuss any questions you have with your health care provider. Document Released: 03/11/2009 Document Revised: 10/21/2015 Document Reviewed: 06/19/2014 Elsevier Interactive Patient Education  2017 [employer].

## 2022-08-01 NOTE — Progress Notes (Signed)
Subjective:   [name] [name] is a 77 y.o. female who presents for Medicare Annual (Subsequent) preventive examination.  I connected with  [name] on 08/01/22 by a audio enabled telemedicine application and verified that I am speaking with the correct person using two identifiers.  Patient Location: Home  Provider Location: Office/Clinic  I discussed the limitations of evaluation and management by telemedicine. The patient expressed understanding and agreed to proceed.  Cardiac Risk Factors include: advanced age (>42mn, >>18women);hypertension;smoking/ tobacco exposure;sedentary lifestyle     Objective:    Today's Vitals   08/01/22 1543  PainSc: 8    There is no height or weight on file to calculate BMI.     10/15/2021   10:23 AM 02/22/2021   10:40 AM 02/11/2021    4:05 PM 10/12/2020    1:59 PM 07/23/2020   12:05 AM 02/17/2015   10:26 AM 01/28/2015    1:16 PM  Advanced Directives  Does Patient Have a Medical Advance Directive? No No No No No No No  Would patient like information on creating a medical advance directive?  No - Patient declined No - Patient declined  No - Patient declined No - patient declined information Yes - Educational materials given    Current Medications (verified) Outpatient Encounter Medications as of 08/01/2022  Medication Sig   albuterol (PROVENTIL) (2.5 MG/3ML) 0.083% nebulizer solution Take 3 mLs (2.5 mg total) by nebulization every 6 (six) hours as needed for wheezing or shortness of breath.   aspirin EC 81 MG tablet Take 1 tablet (81 mg total) by mouth daily. Swallow whole.   Budeson-Glycopyrrol-Formoterol 160-9-4.8 MCG/ACT AERO Inhale 2 puffs into the lungs 2 (two) times daily as needed (shortness of breath).   buPROPion (WELLBUTRIN XL) 150 MG 24 hr tablet TAKE 1 TABLET(150 MG) BY MOUTH DAILY   cloNIDine (CATAPRES) 0.1 MG tablet TAKE ONE TABLET BY MOUTH EVERY MORNING and TAKE ONE TABLET BY MOUTH EVERYDAY AT BEDTIME   diazepam (VALIUM) 5 MG tablet  TAKE ONE TABLET BY MOUTH EVERY TWELVE HOURS AS NEEDED FOR ANXIETY. watch FOR sedation   gabapentin (NEURONTIN) 100 MG capsule Take 1 capsule (100 mg total) by mouth 3 (three) times daily.   ibuprofen (ADVIL) 800 MG tablet TAKE ONE TABLET BY MOUTH every EIGHT hours AS NEEDED FOR moderate pain   isosorbide mononitrate (IMDUR) 30 MG 24 hr tablet Take 0.5 tablets (15 mg total) by mouth daily.   lisinopril-hydrochlorothiazide (ZESTORETIC) 20-12.5 MG tablet TAKE TWO TABLETS BY MOUTH EVERY MORNING   meclizine (ANTIVERT) 25 MG tablet Take 1 tablet (25 mg total) by mouth 3 (three) times daily as needed for dizziness.   nitroGLYCERIN (NITROSTAT) 0.4 MG SL tablet Place 0.4 mg under the tongue every 5 (five) minutes as needed for chest pain.   omeprazole (PRILOSEC) 40 MG capsule TAKE ONE CAPSULE BY MOUTH EVERY MORNING   promethazine (PHENERGAN) 25 MG tablet TAKE ONE TABLET BY MOUTH every SIX hours AS NEEDED FOR NAUSEA AND VOMITING   rosuvastatin (CRESTOR) 10 MG tablet TAKE ONE TABLET BY MOUTH EVERYDAY AT BEDTIME   traZODone (DESYREL) 100 MG tablet TAKE ONE TABLET BY MOUTH AT bedtime   Vitamin D, Ergocalciferol, (DRISDOL) 1.25 MG (50000 UNIT) CAPS capsule Take 1 capsule (50,000 Units total) by mouth every 7 (seven) days.   No facility-administered encounter medications on file as of 08/01/2022.    Allergies (verified) Meperidine, Meperidine hcl, Influenza vaccines, and Other   History: Past Medical History:  Diagnosis Date  Anxiety    Aortic atherosclerosis (HCC)    Ascending aorta dilation ([place])    a.) TTE 09/02/19 --> mild; measured 79m.   Asthma    BMI 24.0-24.9, adult 02/14/2022   CAD (coronary artery disease)    a,) CTA 08/24/20 --> mild CAD (25-49%) in RCA, LAD, LCx; CAC/Agatston score 509.   Cataract    Chronic left shoulder pain 09/27/2015   COPD (chronic obstructive pulmonary disease) (HCC)    Coronary artery disease involving native coronary artery of native heart without angina pectoris  11/18/2020   Degenerative joint disease involving multiple joints 09/28/2017   Depression    Depression, major, single episode, severe ([place] [id number]  Diastolic dysfunction    a.) TTE 09/02/19 --> LVEF 60-65%; G2DD. b.) TTE 07/23/20 --> LVEF 70-75%; G1DD.   Enlarged aorta (HCC)    Essential hypertension 08/07/2019   GERD (gastroesophageal reflux disease)    GERD with esophagitis 08/07/2019   History of kidney stones    Hyperlipidemia    Hypertension    Long-term current use of opiate analgesic 06/25/2017   Lupus ([facility]    Myocardial infarction ([facility]    Osteoarthritis    PAC (premature atrial contraction)    a.) Holter 12/04/19 --> occassional; 1.1% PAC burden.   Pain of right hip joint 11/22/2015   Paraesophageal hernia    PAT (paroxysmal atrial tachycardia) 02/03/2020   Presbyesophagus    Primary osteoarthritis of left knee 02/04/2015   PSVT (paroxysmal supraventricular tachycardia)    a.) Holter 12/04/19 --> 22 runs.   Renal calculus 12/21/2021   S/P repair of paraesophageal hernia 02/22/2021   S/P total knee arthroplasty 02/15/2015   Schatzki's ring    Sciatica 01/31/2022   Tobacco use 08/19/2019   Type 2 diabetes mellitus with other specified complication ([place] [id number]  Past Surgical History:  Procedure Laterality Date   ABDOMINAL HYSTERECTOMY     BLADDER SUSPENSION     BREAST BIOPSY Right    COLONOSCOPY  09/04/2011   Colonic polyps, status post polyectomy. Incidental small ascending colon lipoma   ESOPHAGOGASTRODUODENOSCOPY  09/08/2015   Schatzki ring status post esophageal dilitation. Small hiatal hernia   hemorrhoid surgery     KNEE SURGERY     left   TOTAL KNEE ARTHROPLASTY Left 02/15/2015   Procedure: TOTAL KNEE ARTHROPLASTY;  Surgeon: SVickey Huger MD;  [facility]  Service: Orthopedics;  Laterality: Left;   TUBAL LIGATION     WRIST SURGERY     right   XI ROBOTIC ASSISTED PARAESOPHAGEAL HERNIA REPAIR N/A 02/22/2021   Procedure: XI ROBOTIC ASSISTED PARAESOPHAGEAL  HERNIA REPAIR with RNFA to assist;  Surgeon: PJules Husbands MD;  Location: [facility];  Service: General;  Laterality: N/A;   Family History  Problem Relation Age of Onset   Stroke Mother    Heart disease Mother    COPD Father    Cancer Father        Bone   Diabetes Father    Heart disease Father    Stroke Father    Lupus Sister    Seizures Son    COPD Son    Colon cancer Neg Hx    Esophageal cancer Neg Hx    Stomach cancer Neg Hx    Rectal cancer Neg Hx    Social History   Socioeconomic History   Marital status: Widowed    Spouse name: Not on file   Number of children: 4   Years of education: Not on file  Highest education level: Not on file  Occupational History   Occupation: Retired  Tobacco Use   Smoking status: Every Day    Packs/day: 1.00    Years: 48.00    Total pack years: 48.00    Types: Cigarettes   Smokeless tobacco: Never  Vaping Use   Vaping Use: Never used  Substance and Sexual Activity   Alcohol use: No   Drug use: No   Sexual activity: Not Currently  Other Topics Concern   Not on file  Social History Narrative   Not on file   Social Determinants of Health   Financial Resource Strain: Low Risk  (05/11/2022)   Overall Financial Resource Strain (CARDIA)    Difficulty of Paying Living Expenses: Not hard at all  Food Insecurity: No Food Insecurity (08/01/2022)   Hunger Vital Sign    Worried About Running Out of Food in the Last Year: Never true    [place] in the Last Year: Never true  Transportation Needs: Unmet Transportation Needs (08/01/2022)   PRAPARE - Transportation    Lack of Transportation (Medical): Yes    Lack of Transportation (Non-Medical): Yes  Physical Activity: Inactive (08/01/2022)   Exercise Vital Sign    Days of Exercise per Week: 0 days    Minutes of Exercise per Session: 0 min  Stress: Stress Concern Present (08/01/2022)   [place]    Feeling of Stress  : To some extent  Social Connections: Socially Isolated (05/11/2022)   Social Connection and Isolation Panel [NHANES]    Frequency of Communication with Friends and Family: More than three times a week    Frequency of Social Gatherings with Friends and Family: Twice a week    Attends Religious Services: Never    Marine scientist or Organizations: No    Attends Archivist Meetings: Never    Marital Status: Widowed    Tobacco Counseling Ready to quit: No Counseling given: Not Answered   Clinical Intake:  Pre-visit preparation completed: Yes  Pain : 0-10 Pain Score: 8  Pain Type: Chronic pain Pain Location: Other (Comment) (Neck, Back, Hips, Knees) Pain Frequency: Constant Pain Relieving Factors: Advill 800 mg - no relief Effect of Pain on Daily Activities: moderate Pain Relieving Factors: Advill 800 mg - no relief BMI - recorded: 24.14 Nutritional Status: BMI of 19-24  Normal Nutritional Risks: None Diabetes: No How often do you need to have someone help you when you read instructions, pamphlets, or other written materials from your doctor or pharmacy?: 2 - Rarely Interpreter Needed?: No     Activities of Daily Living    08/01/2022    3:54 PM  In your present state of health, do you have any difficulty performing the following activities:  Hearing? 0  Vision? 0  Difficulty concentrating or making decisions? 0  Walking or climbing stairs? 1  Dressing or bathing? 0  Doing errands, shopping? 1  Preparing Food and eating ? N  Using the Toilet? N  In the past six months, have you accidently leaked urine? N  Do you have problems with loss of bowel control? N  Managing your Medications? N  Managing your Finances? N  Housekeeping or managing your Housekeeping? N    Patient Care Team: Rochel Brome, MD as PCP - General (Internal Medicine) Berniece Salines, DO as PCP - Cardiology (Cardiology) Lane Hacker, [facility] as Pharmacist (Pharmacist) Vanita Ingles,  RN as Case  Manager (General Practice)     Assessment:   This is a routine wellness examination for Walker.  Hearing/Vision screen No results found.  Dietary issues and exercise activities discussed: Current Exercise Habits: The patient does not participate in regular exercise at present, Exercise limited by: Other - see comments (joint pain)   Depression Screen    08/01/2022    3:48 PM 06/26/2022   11:09 AM 02/14/2022    2:55 PM 11/03/2021   11:29 AM 08/11/2021   11:14 AM 05/05/2021    2:40 PM 01/18/2021    2:01 PM  PHQ 2/9 Scores  PHQ - 2 Score '1 1 3 2 4 3 5  '$ PHQ- 9 Score '12 12 17 15 22 16 17    '$ Fall Risk    08/01/2022    3:53 PM 06/26/2022   11:48 AM 06/26/2022   11:09 AM 05/11/2022    1:35 PM 02/20/2022    2:03 PM  [place] in the past year? '1  1 1 1  '$ Number falls in past yr: '1  1 1 1  '$ Injury with Fall? '1  1 1 '$ 0  Comment  fractured a rib. contusion of right knee.     Risk for fall due to : History of fall(s);Impaired balance/gait;Other (Comment)  History of fall(s);Impaired balance/gait History of fall(s);Impaired balance/gait;Impaired mobility;Orthopedic patient;Other (Comment)   Risk for fall due to: Comment pain   chronic back pain and hip pain   Follow up Falls evaluation completed;Education provided;Falls prevention discussed  Falls evaluation completed;Education provided Falls evaluation completed;Education provided;Falls prevention discussed;Follow up appointment     FALL RISK PREVENTION PERTAINING TO THE HOME:  Any stairs in or around the home? No  If so, are there any without handrails? No  Home free of loose throw rugs in walkways, pet beds, electrical cords, etc? Yes  Adequate lighting in your home to reduce risk of falls? Yes   ASSISTIVE DEVICES UTILIZED TO PREVENT FALLS:  Life alert? No  Use of a cane, walker or w/c? Yes  Grab bars in the bathroom? Yes  Shower chair or bench in shower? No  Elevated toilet seat or a handicapped toilet? No    Cognitive Function:        08/01/2022    3:55 PM 10/12/2020    2:11 PM  6CIT Screen  What Year? 0 points 0 points  What month? 0 points 0 points  What time? 0 points 0 points  Count back from 20 0 points 2 points  Months in reverse 4 points 4 points  Repeat phrase 2 points 2 points  Total Score 6 points 8 points    Immunizations Immunization History  Administered Date(s) Administered   Fluad Quad(high Dose 65+) 03/30/2020, 05/05/2021, 04/03/2022   Influenza, High Dose Seasonal PF 03/28/2018    TDAP status: Due, Education has been provided regarding the importance of this vaccine. Advised may receive this vaccine at local pharmacy or Health Dept. Aware to provide a copy of the vaccination record if obtained from local pharmacy or Health Dept. Verbalized acceptance and understanding.  Flu Vaccine status: Up to date  Pneumococcal vaccine status: Declined,  Education has been provided regarding the importance of this vaccine but patient still declined. Advised may receive this vaccine at local pharmacy or Health Dept. Aware to provide a copy of the vaccination record if obtained from local pharmacy or Health Dept. Verbalized acceptance and understanding.   Covid-19 vaccine status: Declined, Education has been provided regarding the  importance of this vaccine but patient still declined. Advised may receive this vaccine at local pharmacy or Health Dept.or vaccine clinic. Aware to provide a copy of the vaccination record if obtained from local pharmacy or Health Dept. Verbalized acceptance and understanding.  Qualifies for Shingles Vaccine? Yes   Zostavax completed No   Shingrix Completed?: No.    Education has been provided regarding the importance of this vaccine. Patient has been advised to call insurance company to determine out of pocket expense if they have not yet received this vaccine. Advised may also receive vaccine at local pharmacy or Health Dept. Verbalized acceptance and  understanding.  Screening Tests Health Maintenance  Topic Date Due   Hepatitis C Screening  Never done   DTaP/Tdap/Td (1 - Tdap) Never done   Zoster Vaccines- Shingrix (1 of 2) Never done   Lung Cancer Screening  08/24/2021   Medicare Annual Wellness (AWV)  10/12/2021   Pneumonia Vaccine 46+ Years old (1 of 2 - PCV) 08/01/2023 (Originally 12/16/1951)   COVID-19 Vaccine (1) 08/01/2023 (Originally 12/16/1950)   Diabetic kidney evaluation - Urine ACR  10/13/2022   Diabetic kidney evaluation - eGFR measurement  06/27/2023   INFLUENZA VACCINE  Completed   DEXA SCAN  Completed   HPV VACCINES  Aged Out    Health Maintenance  Health Maintenance Due  Topic Date Due   Hepatitis C Screening  Never done   DTaP/Tdap/Td (1 - Tdap) Never done   Zoster Vaccines- Shingrix (1 of 2) Never done   Lung Cancer Screening  08/24/2021   Medicare Annual Wellness (AWV)  10/12/2021    Colorectal cancer screening: No longer required.   Mammogram status: No longer required due to patient preference.  Bone Density status: Completed 10/2020. Results reflect: Bone density results: OSTEOPENIA. Repeat every 2 years.  Lung Cancer Screening: (Low Dose CT Chest recommended if Age 53-80 years, 30 pack-year currently smoking OR have quit w/in 15years.) does qualify.   Lung Cancer Screening Referral: Ordered  Additional Screening:   Vision Screening: Recommended annual ophthalmology exams for early detection of glaucoma and other disorders of the eye. Is the patient up to date with their annual eye exam?  No   Dental Screening: Recommended annual dental exams for proper oral hygiene  Community Resource Referral / Chronic Care Management: CRR required this visit?  No   CCM required this visit?  No      Plan:    1- Patient requesting referral to pain management - deferred to provider 2- CT Lung cancer screening ordered, last done 08/24/20 - patient smokes 1 PPD x 48 years  I have personally reviewed and  noted the following in the patient's chart:   Medical and social history Use of alcohol, tobacco or illicit drugs  Current medications and supplements including opioid prescriptions. Patient is not currently taking opioid prescriptions. Functional ability and status Nutritional status Physical activity Advanced directives List of other physicians Hospitalizations, surgeries, and ER visits in previous 12 months Vitals Screenings to include cognitive, depression, and falls Referrals and appointments  In addition, I have reviewed and discussed with patient certain preventive protocols, quality metrics, and best practice recommendations. A written personalized care plan for preventive services as well as general preventive health recommendations were provided to patient.     Erie Noe, LPN   [id number]

## 2022-08-03 ENCOUNTER — Telehealth: Payer: Self-pay

## 2022-08-03 ENCOUNTER — Telehealth: Payer: 59

## 2022-08-03 ENCOUNTER — Ambulatory Visit (INDEPENDENT_AMBULATORY_CARE_PROVIDER_SITE_OTHER): Payer: 59

## 2022-08-03 DIAGNOSIS — M47816 Spondylosis without myelopathy or radiculopathy, lumbar region: Secondary | ICD-10-CM

## 2022-08-03 DIAGNOSIS — F322 Major depressive disorder, single episode, severe without psychotic features: Secondary | ICD-10-CM

## 2022-08-03 DIAGNOSIS — G8929 Other chronic pain: Secondary | ICD-10-CM

## 2022-08-03 DIAGNOSIS — R296 Repeated falls: Secondary | ICD-10-CM

## 2022-08-03 DIAGNOSIS — M48061 Spinal stenosis, lumbar region without neurogenic claudication: Secondary | ICD-10-CM

## 2022-08-03 DIAGNOSIS — E782 Mixed hyperlipidemia: Secondary | ICD-10-CM

## 2022-08-03 DIAGNOSIS — F330 Major depressive disorder, recurrent, mild: Secondary | ICD-10-CM

## 2022-08-03 NOTE — Chronic Care Management (AMB) (Signed)
Chronic Care Management   CCM RN Visit Note  08/03/2022 Name: TORRIN URATA MRN: [id number] DOB: 24-Dec-1945  Subjective: [name] [name] is a 77 y.o. year old female who is a primary care patient of Cox, Kirsten, MD. The patient was referred to the Chronic Care Management team for assistance with care management needs subsequent to provider initiation of CCM services and plan of care.    Today's Visit:  Engaged with patient by telephone for follow up visit.        Goals Addressed             This Visit's Progress    CCM Expected Outcome:  Monitor, Self-Manage and Reduce Symptoms of: Depression       Current Barriers:  Knowledge Deficits related to effective management of pain and the impact her pain level has on her depression and stress level Care Coordination needs related to pain control and resources to help with dealing with her depression  in a patient with depression and high stress level Chronic Disease Management support and education needs related to effective management of depression  Lacks caregiver support.  Transportation barriers Grief and loss: Husband passed in 2014 and her son passed in 2022 of lung cancer   Planned Interventions: Evaluation of current treatment plan related to depression  and patient's adherence to plan as established by provider. The patient is doing well with her depression over all. The biggest thing is the pain she lives with daily. The patient was not having a good day today with her pain. She says she hurts constantly. She denies swelling in her feet and legs and says this is actually better. Reviewed with the patient options for pain help and encouraged her to make an appointment with the pcp to discuss.  Advised patient to call the office for changes in mood, anxiety, stress level, depression, or mental health needs Provided education to patient re: resources available for transportation needs, resources for effective management of pain and  discomfort, resources for effective management of depression and ongoing support Reviewed medications with patient and discussed compliance. The patient states she lays her medications out each night to take in the morning. Says she forgets things sometimes but she does not forget her medications Provided patient with community resources and other resources to help in management of her chronic conditions educational materials related to effective management of depression Reviewed scheduled/upcoming provider appointments including 09-28-2022 at 230 pm Care Guide referral for transportation needs. Talked with the care guide and got numbers for transportation. She has talked to her insurance and she has so many rides that her insurance will pay for for appointments. The patient is also looking into possibly going to the pain specialist that is local.  Discussed plans with patient for ongoing care management follow up and provided patient with direct contact information for care management team Advised patient to discuss changes in her mood, depression, or mental health needs with provider Screening for signs and symptoms of depression related to chronic disease state  Assessed social determinant of health barriers  Symptom Management: Take medications as prescribed   Attend all scheduled provider appointments Call provider office for new concerns or questions  call the Suicide and Crisis Lifeline: 988 call the [place] National Suicide Prevention Lifeline: 575-143-3398 or TTY: 251-626-5627 TTY [phone]) to talk to a trained counselor call 1-800-273-TALK (toll free, 24 hour hotline) if experiencing a Mental Health or [place]   Follow Up Plan: Telephone follow up  appointment with care management team member scheduled for: 10-12-2022 at 230 pm       CCM Expected Outcome:  Monitor, Self-Manage and Reduce Symptoms of: Falls prevention and Safety       Current Barriers:  Knowledge  Deficits related to fall prevention and safety in a patient with multiple chronic conditions Care Coordination needs related to transportation concerns in a patient with frequent falls and chronic pain  Chronic Disease Management support and education needs related to fall prevention and safety Lacks caregiver support.  Transportation barriers  Planned Interventions: Provided  verbal education re: potential causes of falls and Fall prevention strategies Reviewed medications and discussed potential side effects of medications such as dizziness and frequent urination. The patient states she is compliant with medications. Does have dizziness at times. Review of changing position slowly and monitoring for drops in blood pressure. The patient is changing position slowly. She states she has had a couple of incidences of dizziness but takes her medications and lays down until it subsides. The patient is monitoring when she changes positions. Denies any new issues with falls and safety. Will continue to monitor.  Advised patient of importance of notifying provider of falls. Review and education provided Assessed for signs and symptoms of orthostatic hypotension. Review and education provided Assessed for falls since last encounter. Last fall was on 05-07-2022. The patient denies any new falls. States she is being careful. Assessed patients knowledge of fall risk prevention secondary to previously provided education. Review and education provided.  Provided patient information for fall alert systems Assessed working status of life alert bracelet and patient adherence Advised patient to discuss changes in safety in her environment, new questions or concerns related to her chronic conditions and high fall risk with provider Screening for signs and symptoms of depression related to chronic disease state Assessed social determinant of health barriers  Symptom Management: Take medications as prescribed    Attend all scheduled provider appointments Call provider office for new concerns or questions  call the Suicide and Crisis Lifeline: 988 call the [place] National Suicide Prevention Lifeline: 670-488-8097 or TTY: 548-143-9586 TTY 360-085-3395) to talk to a trained counselor call 1-800-273-TALK (toll free, 24 hour hotline) if experiencing a Mental Health or [place]  Report new falls or safety concerns to the provider  Follow Up Plan: Telephone follow up appointment with care management team member scheduled for: 10-12-2022 at 230 pm       CCM Expected Outcome:  Monitor, Self-Manage and Reduce Symptoms of: HLD       Current Barriers:  Chronic Disease Management support and education needs related to effective management of HLD Lab Results  Component Value Date   CHOL 177 06/26/2022   HDL 69 06/26/2022   LDLCALC 97 06/26/2022   TRIG 57 06/26/2022   CHOLHDL 2.6 06/26/2022     Planned Interventions: Provider established cholesterol goals reviewed. The patient is at goal; Counseled on importance of regular laboratory monitoring as prescribed. The patient has regular lab work Provided HLD Scientist, clinical (histocompatibility and immunogenetics); Reviewed role and benefits of statin for ASCVD risk reduction; Discussed strategies to manage statin-induced myalgias. The patient takes rosuvastatin 10 mg daily. The patient has chronic pain but does not have pain related to statins. Denies any new issues with medications. Has her medications; Reviewed importance of limiting foods high in cholesterol. Review of heart healthy diet. The patient states she is eating but since having her hernia surgery she has lost weight and does not have a  good appetite. She stays hydrated. Denies any issues with dietary restrictions. ; Screening for signs and symptoms of depression related to chronic disease state;  Assessed social determinant of health barriers;   Symptom Management: Take medications as prescribed   Attend all  scheduled provider appointments Attend church or other social activities Perform all self care activities independently  Perform IADL's (shopping, preparing meals, housekeeping, managing finances) independently Call provider office for new concerns or questions  Work with the social worker to address care coordination needs and will continue to work with the clinical team to address health care and disease management related needs call the Suicide and Crisis Lifeline: 988 call the [place] National Suicide Prevention Lifeline: 909-771-6328 or TTY: 402 705 2623 TTY 331 479 3515) to talk to a trained counselor call 1-800-273-TALK (toll free, 24 hour hotline) if experiencing a Mental Health or [place]  - call for medicine refill 2 or 3 days before it runs out - take all medications exactly as prescribed - call doctor with any symptoms you believe are related to your medicine - call doctor when you experience any new symptoms - go to all doctor appointments as scheduled - adhere to prescribed diet: heart healthy/ADA diet   Follow Up Plan: Telephone follow up appointment with care management team member scheduled for: 10-12-2022 at 230 pm          Plan:Telephone follow up appointment with care management team member scheduled for:  10-12-2022 at 230 pm  [place], MSN, CCM RN Care Manager  Chronic Care Management Direct Number: [phone]

## 2022-08-03 NOTE — Telephone Encounter (Signed)
Patient called stating that she is requesting something for pain to be sent in to the pharmacy. She states she is in so much pain with her back, neck, and her left knee pain. She states she is in so much pain all the time and wants something sent in to pharmacy.

## 2022-08-03 NOTE — Patient Instructions (Signed)
Please call the care guide team at [phone] if you need to cancel or reschedule your appointment.   If you are experiencing a Mental Health or [place] or need someone to talk to, please call the Suicide and Crisis Lifeline: 988 call the [place] National Suicide Prevention Lifeline: 843-011-0498 or TTY: 410-370-1933 TTY 934-153-2958) to talk to a trained counselor call 1-800-273-TALK (toll free, 24 hour hotline)   Following is a copy of the CCM Program Consent:  CCM service includes personalized support from designated clinical staff supervised by the physician, including individualized plan of care and coordination with other care providers 24/7 contact phone numbers for assistance for urgent and routine care needs. Service will only be billed when office clinical staff spend 20 minutes or more in a month to coordinate care. Only one practitioner may furnish and bill the service in a calendar month. The patient may stop CCM services at amy time (effective at the end of the month) by phone call to the office staff. The patient will be responsible for cost sharing (co-pay) or up to 20% of the service fee (after annual deductible is met)  Following is a copy of your full provider care plan:   Goals Addressed             This Visit's Progress    CCM Expected Outcome:  Monitor, Self-Manage and Reduce Symptoms of: Depression       Current Barriers:  Knowledge Deficits related to effective management of pain and the impact her pain level has on her depression and stress level Care Coordination needs related to pain control and resources to help with dealing with her depression  in a patient with depression and high stress level Chronic Disease Management support and education needs related to effective management of depression  Lacks caregiver support.  Transportation barriers Grief and loss: Husband passed in 2014 and her son passed in 2022 of lung cancer   Planned  Interventions: Evaluation of current treatment plan related to depression  and patient's adherence to plan as established by provider. The patient is doing well with her depression over all. The biggest thing is the pain she lives with daily. The patient was not having a good day today with her pain. She says she hurts constantly. She denies swelling in her feet and legs and says this is actually better. Reviewed with the patient options for pain help and encouraged her to make an appointment with the pcp to discuss.  Advised patient to call the office for changes in mood, anxiety, stress level, depression, or mental health needs Provided education to patient re: resources available for transportation needs, resources for effective management of pain and discomfort, resources for effective management of depression and ongoing support Reviewed medications with patient and discussed compliance. The patient states she lays her medications out each night to take in the morning. Says she forgets things sometimes but she does not forget her medications Provided patient with community resources and other resources to help in management of her chronic conditions educational materials related to effective management of depression Reviewed scheduled/upcoming provider appointments including 09-28-2022 at 230 pm Care Guide referral for transportation needs. Talked with the care guide and got numbers for transportation. She has talked to her insurance and she has so many rides that her insurance will pay for for appointments. The patient is also looking into possibly going to the pain specialist that is local.  Discussed plans with patient for ongoing care management follow  up and provided patient with direct contact information for care management team Advised patient to discuss changes in her mood, depression, or mental health needs with provider Screening for signs and symptoms of depression related to chronic disease  state  Assessed social determinant of health barriers  Symptom Management: Take medications as prescribed   Attend all scheduled provider appointments Call provider office for new concerns or questions  call the Suicide and Crisis Lifeline: 988 call the [place] National Suicide Prevention Lifeline: (754)440-1113 or TTY: 208 622 5688 TTY 929-398-5014) to talk to a trained counselor call 1-800-273-TALK (toll free, 24 hour hotline) if experiencing a Mental Health or [place]   Follow Up Plan: Telephone follow up appointment with care management team member scheduled for: 10-12-2022 at 230 pm       CCM Expected Outcome:  Monitor, Self-Manage and Reduce Symptoms of: Falls prevention and Safety       Current Barriers:  Knowledge Deficits related to fall prevention and safety in a patient with multiple chronic conditions Care Coordination needs related to transportation concerns in a patient with frequent falls and chronic pain  Chronic Disease Management support and education needs related to fall prevention and safety Lacks caregiver support.  Transportation barriers  Planned Interventions: Provided  verbal education re: potential causes of falls and Fall prevention strategies Reviewed medications and discussed potential side effects of medications such as dizziness and frequent urination. The patient states she is compliant with medications. Does have dizziness at times. Review of changing position slowly and monitoring for drops in blood pressure. The patient is changing position slowly. She states she has had a couple of incidences of dizziness but takes her medications and lays down until it subsides. The patient is monitoring when she changes positions. Denies any new issues with falls and safety. Will continue to monitor.  Advised patient of importance of notifying provider of falls. Review and education provided Assessed for signs and symptoms of orthostatic hypotension.  Review and education provided Assessed for falls since last encounter. Last fall was on 05-07-2022. The patient denies any new falls. States she is being careful. Assessed patients knowledge of fall risk prevention secondary to previously provided education. Review and education provided.  Provided patient information for fall alert systems Assessed working status of life alert bracelet and patient adherence Advised patient to discuss changes in safety in her environment, new questions or concerns related to her chronic conditions and high fall risk with provider Screening for signs and symptoms of depression related to chronic disease state Assessed social determinant of health barriers  Symptom Management: Take medications as prescribed   Attend all scheduled provider appointments Call provider office for new concerns or questions  call the Suicide and Crisis Lifeline: 988 call the [place] National Suicide Prevention Lifeline: 810-845-3955 or TTY: 512-017-6497 TTY 4047928057) to talk to a trained counselor call 1-800-273-TALK (toll free, 24 hour hotline) if experiencing a Mental Health or [place]  Report new falls or safety concerns to the provider  Follow Up Plan: Telephone follow up appointment with care management team member scheduled for: 10-12-2022 at 230 pm       CCM Expected Outcome:  Monitor, Self-Manage and Reduce Symptoms of: HLD       Current Barriers:  Chronic Disease Management support and education needs related to effective management of HLD Lab Results  Component Value Date   CHOL 177 06/26/2022   HDL 69 06/26/2022   LDLCALC 97 06/26/2022   TRIG 57 06/26/2022  CHOLHDL 2.6 06/26/2022     Planned Interventions: Provider established cholesterol goals reviewed. The patient is at goal; Counseled on importance of regular laboratory monitoring as prescribed. The patient has regular lab work Provided HLD Scientist, clinical (histocompatibility and immunogenetics); Reviewed role and  benefits of statin for ASCVD risk reduction; Discussed strategies to manage statin-induced myalgias. The patient takes rosuvastatin 10 mg daily. The patient has chronic pain but does not have pain related to statins. Denies any new issues with medications. Has her medications; Reviewed importance of limiting foods high in cholesterol. Review of heart healthy diet. The patient states she is eating but since having her hernia surgery she has lost weight and does not have a good appetite. She stays hydrated. Denies any issues with dietary restrictions. ; Screening for signs and symptoms of depression related to chronic disease state;  Assessed social determinant of health barriers;   Symptom Management: Take medications as prescribed   Attend all scheduled provider appointments Attend church or other social activities Perform all self care activities independently  Perform IADL's (shopping, preparing meals, housekeeping, managing finances) independently Call provider office for new concerns or questions  Work with the social worker to address care coordination needs and will continue to work with the clinical team to address health care and disease management related needs call the Suicide and Crisis Lifeline: 988 call the [place] National Suicide Prevention Lifeline: 928-843-6710 or TTY: 704 578 4793 TTY 801-473-9150) to talk to a trained counselor call 1-800-273-TALK (toll free, 24 hour hotline) if experiencing a Mental Health or [place]  - call for medicine refill 2 or 3 days before it runs out - take all medications exactly as prescribed - call doctor with any symptoms you believe are related to your medicine - call doctor when you experience any new symptoms - go to all doctor appointments as scheduled - adhere to prescribed diet: heart healthy/ADA diet   Follow Up Plan: Telephone follow up appointment with care management team member scheduled for: 10-12-2022 at 230 pm           Patient verbalizes understanding of instructions and care plan provided today and agrees to view in [place]. Active MyChart status and patient understanding of how to access instructions and care plan via MyChart confirmed with patient.     Telephone follow up appointment with care management team member scheduled for: 10-12-2022 at 230 pm

## 2022-08-04 ENCOUNTER — Other Ambulatory Visit: Payer: Self-pay | Admitting: Family Medicine

## 2022-08-04 MED ORDER — GABAPENTIN 300 MG PO CAPS: 300.0000 mg | ORAL_CAPSULE | Freq: Two times a day (BID) | ORAL | 3 refills | Status: DC

## 2022-08-04 NOTE — Telephone Encounter (Signed)
Unable to leave message due to voicemail box being full.

## 2022-08-04 NOTE — Telephone Encounter (Signed)
Patient informed patient states that she takes the gabapentin 100 mg twice daily instead of the instructed 3 times daily. She states its hard for her to remember to take it 3 times daily. She states its easier for her to remember twice daily. Send back recommendations and changes and I will call patient back to inform her of new dose.

## 2022-08-07 NOTE — Telephone Encounter (Signed)
Patient informed. 

## 2022-08-07 NOTE — Telephone Encounter (Signed)
Unable to leave voicemail due to mailbox being full.

## 2022-08-07 NOTE — Addendum Note (Signed)
Addended by: Erie Noe on: 08/07/2022 11:44 AM   Modules accepted: Orders

## 2022-08-08 ENCOUNTER — Telehealth: Payer: Self-pay | Admitting: Family Medicine

## 2022-08-08 NOTE — Telephone Encounter (Signed)
Patient notified

## 2022-08-08 NOTE — Telephone Encounter (Signed)
   [name] [name] has been scheduled for the following appointment:  WHAT: CT LUNG SCREEN WHERE: [facility] DATE: 08/10/22 TIME: 10:00 AM CHECK-IN  Unable to leave message for patient.

## 2022-08-09 IMAGING — CR DG UGI W/ SMALL BOWEL
1 series · 1 of 1 positions shown · non-contrast
Comparison: None.

CLINICAL DATA: Abdominal pain and bloating. Vomiting. Sensation of
solid food becoming stuck in abdomen. 4-5 months status post hiatal
hernia repair and Nissen fundoplication.

EXAM:
UPPER GI SERIES WITH SMALL BOWEL FOLLOW-THROUGH
FLUOROSCOPY:
Radiation Exposure Index (if provided by the fluoroscopic device):
58.8 mGy
Number of Acquired Spot Images: 0 (image capsular only)
TECHNIQUE: Combined double contrast and single contrast upper GI series using
effervescent crystals, thick barium, and thin barium. Subsequently,
serial images of the small bowel were obtained including spot views
of the terminal ileum.

[t abdomen supine]
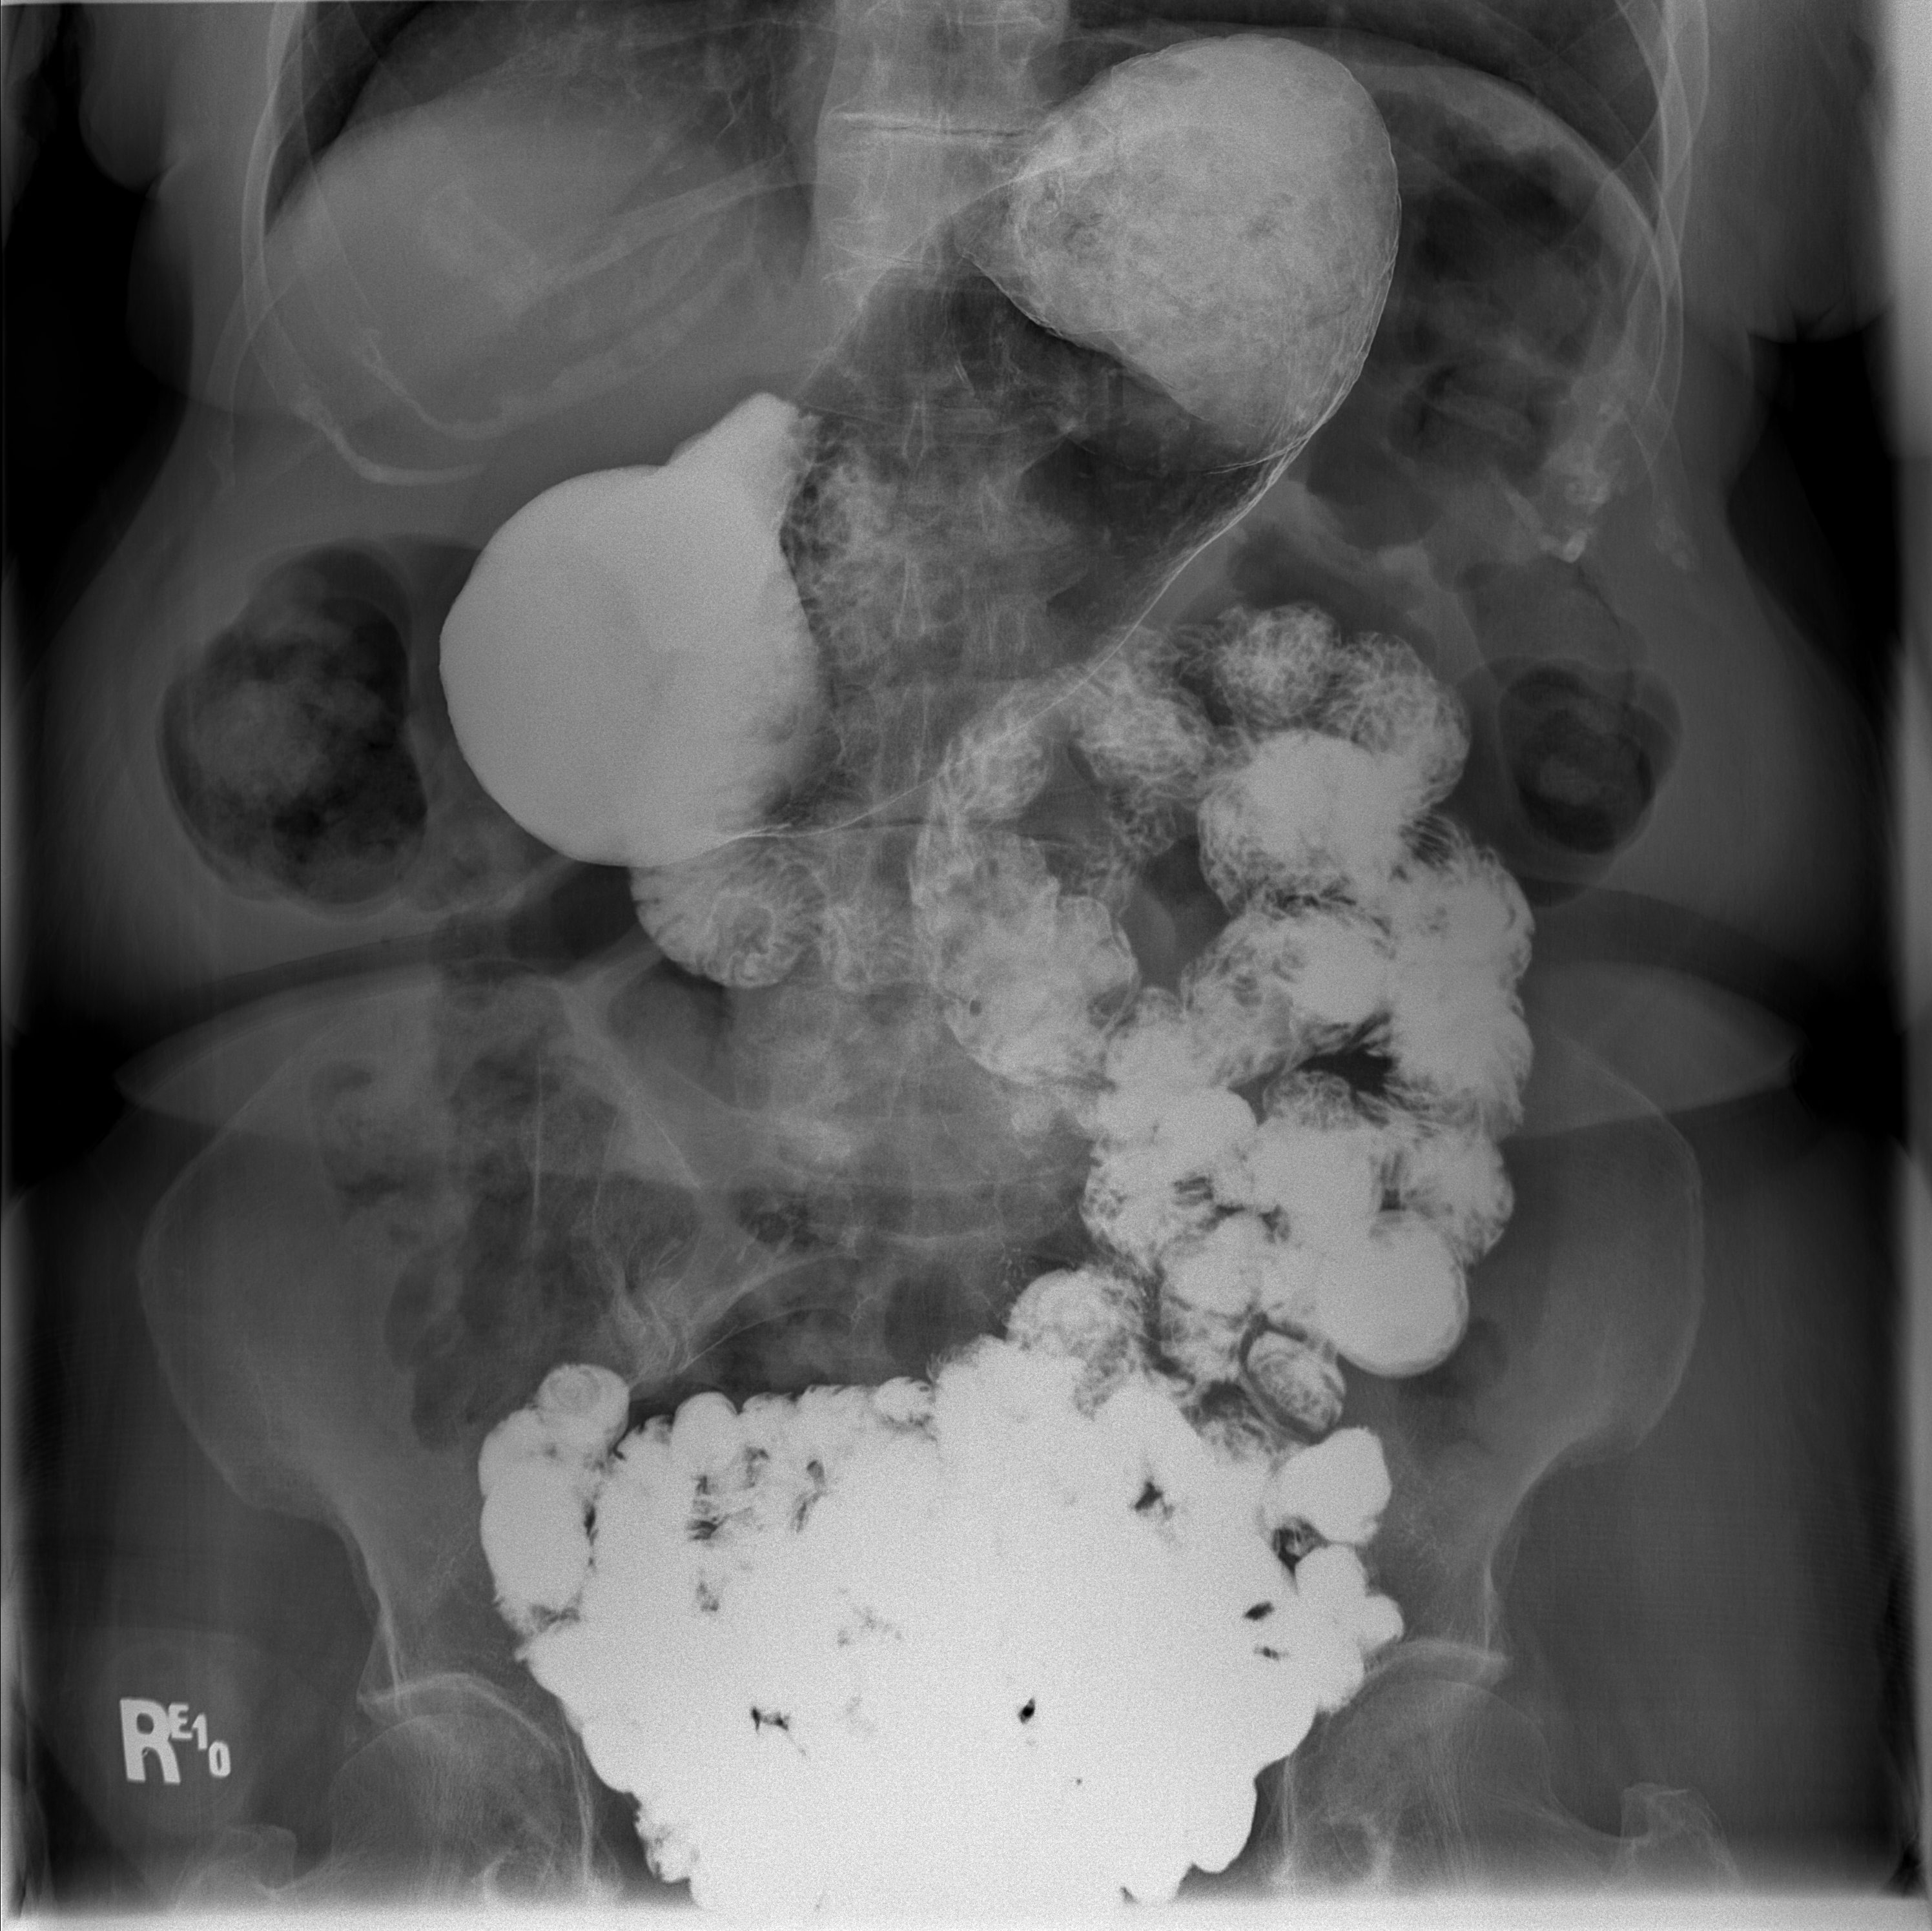

[1 of 1 positions shown; findings below may reference images not displayed]

FINDINGS: Scout radiograph:  Unremarkable bowel gas pattern.

Esophagus: No evidence of esophageal mass or stricture. Mild
extrinsic mass effect is seen at the GE junction, consistent with
previous Nissen fundoplication.

Mild-to-moderate esophageal dysmotility is noted, with break up
primary peristalsis in the proximal thoracic esophagus. No
gastroesophageal reflux observed. Ingested 13mm barium tablet passed
through the esophagus and into the stomach, without becoming stuck.

Stomach: Normal appearance. No evidence of recurrent hiatal hernia.
No evidence of gastric mass or ulcer.

Duodenum:  No ulcer or other significant abnormality identified.

Jejunum and Ileum: No evidence of small bowel dilatation or
obstruction. Transit time is within normal limits. No evidence of
mass, stricture, or wall thickening. Jejunal and ileal fold patterns
are within normal limits. Spot views of terminal ileum are also
unremarkable.

Other:  None.
IMPRESSION: Expected postoperative appearance status post Nissen fundoplication.
No evidence of recurrent hiatal hernia or esophageal stricture.

Mild-to-moderate esophageal dysmotility.

Normal small bowel follow-through.

## 2022-08-11 ENCOUNTER — Other Ambulatory Visit: Payer: Self-pay | Admitting: Family Medicine

## 2022-08-11 DIAGNOSIS — M48061 Spinal stenosis, lumbar region without neurogenic claudication: Secondary | ICD-10-CM

## 2022-08-11 DIAGNOSIS — M5431 Sciatica, right side: Secondary | ICD-10-CM

## 2022-08-14 ENCOUNTER — Telehealth: Payer: Self-pay

## 2022-08-14 ENCOUNTER — Other Ambulatory Visit: Payer: Self-pay

## 2022-08-14 DIAGNOSIS — F339 Major depressive disorder, recurrent, unspecified: Secondary | ICD-10-CM

## 2022-08-14 MED ORDER — BUPROPION HCL ER (XL) 150 MG PO TB24: ORAL_TABLET | ORAL | 2 refills | Status: DC

## 2022-08-14 NOTE — Progress Notes (Signed)
Care Management & Coordination Services Pharmacy Team  Reason for Encounter: Medication coordination and delivery  Contacted patient to discuss medications and coordinate delivery from [facility].  Spoke with patient on 08/16/2022   Cycle dispensing form sent to [place] [facility] for review.   Last adherence delivery date:07/26/22      Patient is due for next adherence delivery on: 08/24/22  This delivery to include: Adherence Packaging  30 Days  Isosorbide Monoitrate 30 mg 0.5 tablet at Breakfast  Rosuvastatin 10 mg 1 at bedtime Clonidine 0.1 mg 1 at breakfast and 1 at Bedtime Bupropion XL 150 mg 1 at breakfast Trazodone 100 mg 1 at bedtime prn (Vials) Lisinopril-HCTZ 20-12.5 mg 2 at breakfast Omeprazole 40 mg 1 at breakfast  Meclizine 25mg -1 tab three times daily as needed     Diazepam 5mg -1 tab every 12 hours as needed Ibuprofen 800mg -! tablet every EIGHT hours AS NEEDED  Gabapentin 300mg -1 capsule twice daily   Breztri Inhaler 160-9-4.10mcg-Inhale 2 puffs into the lungs 2 (two) times daily prn Phenergan 25mg - 1 tablet every six hours prn (bottles)  Patient declined the following medications this month: Asprin 81mg - OTC Nitroglycerin 0.4-Prn, plenty of supply Vitamin D 50,000units-No longer taking  Albuterol Nebulizer- Only prn, plenty of supply  Refills requested from providers include: Ibuprofen 800mg   Confirmed delivery date of 08/24/22, advised patient that pharmacy will contact them the morning of delivery.  Any concerns about your medications? No  How often do you forget or accidentally miss a dose? Never  Do you use a pillbox? No  Is patient in packaging Yes  If yes  What is the date on your next pill pack? 08/17/22  Any concerns or issues with your packaging? No concerns    Recent blood pressure readings are as follows: 06/26/22 124/62  Chart review: Recent office visits:  08/04/22 Rochel Brome MD. Orders Only. Increased Gabapentin to 300mg  two times  daily.   08/01/22 Rhae Hammock LPN. Seen for Medicare Annual Wellness. No med changes.   Recent consult visits:  None  Hospital visits:  None  Medications: Outpatient Encounter Medications as of 08/14/2022  Medication Sig   gabapentin (NEURONTIN) 300 MG capsule Take 1 capsule (300 mg total) by mouth 2 (two) times daily.   albuterol (PROVENTIL) (2.5 MG/3ML) 0.083% nebulizer solution Take 3 mLs (2.5 mg total) by nebulization every 6 (six) hours as needed for wheezing or shortness of breath.   aspirin EC 81 MG tablet Take 1 tablet (81 mg total) by mouth daily. Swallow whole.   Budeson-Glycopyrrol-Formoterol 160-9-4.8 MCG/ACT AERO Inhale 2 puffs into the lungs 2 (two) times daily as needed (shortness of breath).   buPROPion (WELLBUTRIN XL) 150 MG 24 hr tablet TAKE 1 TABLET(150 MG) BY MOUTH DAILY   cloNIDine (CATAPRES) 0.1 MG tablet TAKE ONE TABLET BY MOUTH EVERY MORNING and TAKE ONE TABLET BY MOUTH EVERYDAY AT BEDTIME   diazepam (VALIUM) 5 MG tablet TAKE ONE TABLET BY MOUTH EVERY TWELVE HOURS AS NEEDED FOR ANXIETY. watch FOR sedation   ibuprofen (ADVIL) 800 MG tablet TAKE ONE TABLET BY MOUTH every EIGHT hours AS NEEDED FOR moderate pain   isosorbide mononitrate (IMDUR) 30 MG 24 hr tablet Take 0.5 tablets (15 mg total) by mouth daily.   lisinopril-hydrochlorothiazide (ZESTORETIC) 20-12.5 MG tablet TAKE TWO TABLETS BY MOUTH EVERY MORNING   meclizine (ANTIVERT) 25 MG tablet Take 1 tablet (25 mg total) by mouth 3 (three) times daily as needed for dizziness.   nitroGLYCERIN (NITROSTAT) 0.4 MG SL tablet Place  0.4 mg under the tongue every 5 (five) minutes as needed for chest pain.   omeprazole (PRILOSEC) 40 MG capsule TAKE ONE CAPSULE BY MOUTH EVERY MORNING   promethazine (PHENERGAN) 25 MG tablet TAKE ONE TABLET BY MOUTH every SIX hours AS NEEDED FOR NAUSEA AND VOMITING   rosuvastatin (CRESTOR) 10 MG tablet TAKE ONE TABLET BY MOUTH EVERYDAY AT BEDTIME   traZODone (DESYREL) 100 MG tablet TAKE ONE  TABLET BY MOUTH AT bedtime   Vitamin D, Ergocalciferol, (DRISDOL) 1.25 MG (50000 UNIT) CAPS capsule Take 1 capsule (50,000 Units total) by mouth every 7 (seven) days.   No facility-administered encounter medications on file as of 08/14/2022.   BP Readings from Last 3 Encounters:  06/26/22 124/62  02/22/22 124/60  02/14/22 (!) 90/50    Pulse Readings from Last 3 Encounters:  06/26/22 62  02/22/22 63  02/14/22 77    Lab Results  Component Value Date/Time   HGBA1C 5.2 02/14/2022 03:20 PM   HGBA1C 5.3 10/12/2021 04:57 PM   Lab Results  Component Value Date   CREATININE 0.66 06/26/2022   BUN 10 06/26/2022   GFRNONAA >60 10/15/2021   GFRAA 100 07/14/2020   NA 138 06/26/2022   K 4.4 06/26/2022   CALCIUM 9.6 06/26/2022   CO2 27 06/26/2022     Elray Mcgregor, [place] Clinical Pharmacist Assistant  [phone]

## 2022-08-16 ENCOUNTER — Other Ambulatory Visit: Payer: Self-pay

## 2022-08-16 MED ORDER — IBUPROFEN 800 MG PO TABS: ORAL_TABLET | ORAL | 2 refills | Status: DC

## 2022-08-17 ENCOUNTER — Ambulatory Visit: Payer: 59 | Admitting: Family Medicine

## 2022-08-21 ENCOUNTER — Other Ambulatory Visit: Payer: Self-pay | Admitting: Family Medicine

## 2022-08-21 DIAGNOSIS — K529 Noninfective gastroenteritis and colitis, unspecified: Secondary | ICD-10-CM

## 2022-08-22 ENCOUNTER — Other Ambulatory Visit: Payer: Self-pay

## 2022-08-22 DIAGNOSIS — K529 Noninfective gastroenteritis and colitis, unspecified: Secondary | ICD-10-CM

## 2022-08-22 MED ORDER — PROMETHAZINE HCL 25 MG PO TABS: ORAL_TABLET | ORAL | 3 refills | Status: DC

## 2022-08-27 DIAGNOSIS — E785 Hyperlipidemia, unspecified: Secondary | ICD-10-CM | POA: Diagnosis not present

## 2022-08-27 DIAGNOSIS — F32A Depression, unspecified: Secondary | ICD-10-CM | POA: Diagnosis not present

## 2022-09-04 DIAGNOSIS — F17210 Nicotine dependence, cigarettes, uncomplicated: Secondary | ICD-10-CM | POA: Diagnosis not present

## 2022-09-04 DIAGNOSIS — Z122 Encounter for screening for malignant neoplasm of respiratory organs: Secondary | ICD-10-CM | POA: Diagnosis not present

## 2022-09-04 DIAGNOSIS — Z87891 Personal history of nicotine dependence: Secondary | ICD-10-CM | POA: Diagnosis not present

## 2022-09-11 ENCOUNTER — Telehealth: Payer: Self-pay

## 2022-09-11 NOTE — Progress Notes (Signed)
Care Management & Coordination Services Pharmacy Team  Reason for Encounter: Appointment Reminder  Contacted patient to confirm telephone appointment with [name], PharmD on 09/13/22 at 4:00 pm.  Spoke with patient on 09/11/2022   Do you have any problems getting your medications? No  What is your top health concern you would like to discuss at your upcoming visit?  Pt has an appt the same day with Dr. Sedalia Muta for neck pain. Maybe wants to go over that. She is having headaches.   Have you seen any other providers since your last visit with PCP? No   Chart review:  Recent office visits:  None  Recent consult visits:  None  Hospital visits:  None   Star Rating Drugs:  Medication:  Last Fill: Day Supply Lisinopril-HCTZ 08/24/22-07/24/22 30ds Rosuvastatin   08/24/22-07/24/22 30ds  Care Gaps: Annual wellness visit in last year? Yes  If Diabetic: Last eye exam / retinopathy screening:Never done Last diabetic foot exam:05/05/21   Roxana Hires, [facility] Clinical Pharmacist Assistant  [phone]

## 2022-09-12 ENCOUNTER — Other Ambulatory Visit: Payer: Self-pay

## 2022-09-12 DIAGNOSIS — Z87891 Personal history of nicotine dependence: Secondary | ICD-10-CM

## 2022-09-13 ENCOUNTER — Telehealth: Payer: Self-pay

## 2022-09-13 ENCOUNTER — Other Ambulatory Visit: Payer: Self-pay

## 2022-09-13 ENCOUNTER — Ambulatory Visit (INDEPENDENT_AMBULATORY_CARE_PROVIDER_SITE_OTHER): Payer: 59 | Admitting: Family Medicine

## 2022-09-13 ENCOUNTER — Other Ambulatory Visit: Payer: Self-pay | Admitting: Family Medicine

## 2022-09-13 ENCOUNTER — Ambulatory Visit: Payer: 59

## 2022-09-13 VITALS — BP 108/58 | HR 98 | Temp 97.2°F | Ht 62.0 in | Wt 133.0 lb

## 2022-09-13 DIAGNOSIS — R519 Headache, unspecified: Secondary | ICD-10-CM

## 2022-09-13 DIAGNOSIS — M79602 Pain in left arm: Secondary | ICD-10-CM

## 2022-09-13 DIAGNOSIS — R42 Dizziness and giddiness: Secondary | ICD-10-CM | POA: Diagnosis not present

## 2022-09-13 DIAGNOSIS — M542 Cervicalgia: Secondary | ICD-10-CM

## 2022-09-13 DIAGNOSIS — L659 Nonscarring hair loss, unspecified: Secondary | ICD-10-CM

## 2022-09-13 DIAGNOSIS — F4321 Adjustment disorder with depressed mood: Secondary | ICD-10-CM

## 2022-09-13 DIAGNOSIS — F322 Major depressive disorder, single episode, severe without psychotic features: Secondary | ICD-10-CM

## 2022-09-13 MED ORDER — DIAZEPAM 5 MG PO TABS: ORAL_TABLET | ORAL | 2 refills | Status: DC

## 2022-09-13 MED ORDER — DIAZEPAM 5 MG PO TABS
ORAL_TABLET | ORAL | 3 refills | Status: DC
Start: 2022-09-13 — End: 2022-09-13

## 2022-09-13 MED ORDER — TRAZODONE HCL 100 MG PO TABS
100.0000 mg | ORAL_TABLET | Freq: Every day | ORAL | 1 refills | Status: DC
Start: 2022-09-13 — End: 2023-01-30

## 2022-09-13 MED ORDER — LISINOPRIL-HYDROCHLOROTHIAZIDE 20-12.5 MG PO TABS: 2.0000 | ORAL_TABLET | Freq: Every morning | ORAL | 1 refills | Status: DC

## 2022-09-13 MED ORDER — OMEPRAZOLE 40 MG PO CPDR: 40.0000 mg | DELAYED_RELEASE_CAPSULE | Freq: Every morning | ORAL | 1 refills | Status: DC

## 2022-09-13 MED ORDER — PREDNISONE 50 MG PO TABS
50.0000 mg | ORAL_TABLET | Freq: Every day | ORAL | 0 refills | Status: DC
Start: 2022-09-13 — End: 2022-09-28

## 2022-09-13 MED ORDER — BUDESON-GLYCOPYRROL-FORMOTEROL 160-9-4.8 MCG/ACT IN AERO: 2.0000 | INHALATION_SPRAY | Freq: Two times a day (BID) | RESPIRATORY_TRACT | 2 refills | Status: DC | PRN

## 2022-09-13 MED ORDER — FINASTERIDE 1 MG PO TABS
1.0000 mg | ORAL_TABLET | Freq: Every day | ORAL | 3 refills | Status: DC
Start: 2022-09-13 — End: 2022-11-13

## 2022-09-13 NOTE — Telephone Encounter (Signed)
Compliant on meds 

## 2022-09-13 NOTE — Progress Notes (Signed)
Care Management & Coordination Services Pharmacy Team  Reason for Encounter: Medication coordination and delivery  Contacted patient to discuss medications and coordinate delivery from [facility].  Spoke with patient on 09/13/2022   Cycle dispensing form sent to [name] for review.   Last adherence delivery date:08/24/22      Patient is due for next adherence delivery on: 09/25/22  This delivery to include: Adherence Packaging  30 Days  Isosorbide Monoitrate 30 mg 0.5 tablet at Breakfast  Rosuvastatin 10 mg 1 at bedtime Clonidine 0.1 mg 1 at breakfast and 1 at Bedtime Bupropion XL 150 mg 1 at breakfast Trazodone 100 mg 1 at bedtime prn (Vials) Lisinopril-HCTZ 20-12.5 mg 2 at breakfast Omeprazole 40 mg 1 at breakfast  Meclizine 25mg -1 tab three times daily as needed     Vitamin D2 50,000 units-I capsule once a week Diazepam 5mg -1 tab every 12 hours as needed Gabapentin 300mg -1 capsule twice daily       Breztri Inhaler 160-9-4.57mcg-Inhale 2 puffs into the lungs 2 (two) times daily prn Phenergan 25mg - 1 tablet every six hours prn (bottles)  Patient declined the following medications this month: Asprin 81mg - OTC Nitroglycerin 0.4-Prn, plenty of supply Albuterol Nebulizer- Only prn, plenty of supply Ibuprofen 800mg -Has plenty of supply   Refills requested from providers include: Omeprazole 40 mg  Lisinopril-HCTZ 20-12.5 mg Trazodone 100 mg Diazepam 5mg  Breztri Inhaler 160-9-4.81mcg  Confirmed delivery date of 09/25/22, advised patient that pharmacy will contact them the morning of delivery.   Any concerns about your medications? No  How often do you forget or accidentally miss a dose? Never  Do you use a pillbox? No  Is patient in packaging Yes  If yes  What is the date on your next pill pack? 09/14/22  Any concerns or issues with your packaging? No concerns    Recent blood pressure readings are as follows: Pt still needs to get a new home BP cuff. Unable  to take BP.   Chart review: Recent office visits:  None  Recent consult visits:  None  Hospital visits:  None  Medications: Outpatient Encounter Medications as of 09/13/2022  Medication Sig   gabapentin (NEURONTIN) 300 MG capsule Take 1 capsule (300 mg total) by mouth 2 (two) times daily.   albuterol (PROVENTIL) (2.5 MG/3ML) 0.083% nebulizer solution Take 3 mLs (2.5 mg total) by nebulization every 6 (six) hours as needed for wheezing or shortness of breath.   aspirin EC 81 MG tablet Take 1 tablet (81 mg total) by mouth daily. Swallow whole.   Budeson-Glycopyrrol-Formoterol 160-9-4.8 MCG/ACT AERO Inhale 2 puffs into the lungs 2 (two) times daily as needed (shortness of breath).   buPROPion (WELLBUTRIN XL) 150 MG 24 hr tablet TAKE 1 TABLET(150 MG) BY MOUTH DAILY   cloNIDine (CATAPRES) 0.1 MG tablet TAKE ONE TABLET BY MOUTH EVERY MORNING and TAKE ONE TABLET BY MOUTH EVERYDAY AT BEDTIME   diazepam (VALIUM) 5 MG tablet TAKE ONE TABLET BY MOUTH EVERY TWELVE HOURS AS NEEDED FOR ANXIETY. watch FOR sedation   ibuprofen (ADVIL) 800 MG tablet TAKE ONE TABLET BY MOUTH every EIGHT hours AS NEEDED FOR moderate pain   isosorbide mononitrate (IMDUR) 30 MG 24 hr tablet Take 0.5 tablets (15 mg total) by mouth daily.   lisinopril-hydrochlorothiazide (ZESTORETIC) 20-12.5 MG tablet TAKE TWO TABLETS BY MOUTH EVERY MORNING   meclizine (ANTIVERT) 25 MG tablet Take 1 tablet (25 mg total) by mouth 3 (three) times daily as needed for dizziness.   nitroGLYCERIN (NITROSTAT) 0.4 MG  SL tablet Place 0.4 mg under the tongue every 5 (five) minutes as needed for chest pain.   omeprazole (PRILOSEC) 40 MG capsule TAKE ONE CAPSULE BY MOUTH EVERY MORNING   promethazine (PHENERGAN) 25 MG tablet TAKE ONE TABLET BY MOUTH every SIX hours AS NEEDED FOR NAUSEA AND VOMITING   promethazine (PHENERGAN) 25 MG tablet TAKE ONE TABLET BY MOUTH every SIX hours AS NEEDED FOR NAUSEA AND VOMITING   rosuvastatin (CRESTOR) 10 MG tablet TAKE ONE  TABLET BY MOUTH EVERYDAY AT BEDTIME   traZODone (DESYREL) 100 MG tablet TAKE ONE TABLET BY MOUTH AT bedtime   Vitamin D, Ergocalciferol, (DRISDOL) 1.25 MG (50000 UNIT) CAPS capsule Take 1 capsule (50,000 Units total) by mouth every 7 (seven) days.   No facility-administered encounter medications on file as of 09/13/2022.   BP Readings from Last 3 Encounters:  06/26/22 124/62  02/22/22 124/60  02/14/22 (!) 90/50    Pulse Readings from Last 3 Encounters:  06/26/22 62  02/22/22 63  02/14/22 77    Lab Results  Component Value Date/Time   HGBA1C 5.2 02/14/2022 03:20 PM   HGBA1C 5.3 10/12/2021 04:57 PM   Lab Results  Component Value Date   CREATININE 0.66 06/26/2022   BUN 10 06/26/2022   GFRNONAA >60 10/15/2021   GFRAA 100 07/14/2020   NA 138 06/26/2022   K 4.4 06/26/2022   CALCIUM 9.6 06/26/2022   CO2 27 06/26/2022     [name] [name], CMA Clinical Pharmacist Assistant  [phone]

## 2022-09-13 NOTE — Patient Outreach (Signed)
  Care Management   Follow Up Note   09/13/2022 Name: [name] [name] MRN: [id number] DOB: [birth year]   Referred by: Blane Ohara, MD Reason for referral : Care Coordination   Successful contact was made with the patient to discuss care management and care coordination services. Patient declines engagement at this time.  -Patient already saw PCP today  Follow Up Plan: Telephone follow up appointment with care management team member scheduled for:  [name], Pharm.D. - [phone]

## 2022-09-13 NOTE — Patient Instructions (Signed)
Start prednisone 50 mg daily. X 5 days for neck and arm pain. This will also cover you for temporal arteritis (temporal headache) if needed.    Start finasteride 1 mg once daily for hair loss.  Get neck xray.

## 2022-09-13 NOTE — Progress Notes (Unsigned)
Acute Office Visit  Subjective:    Patient ID: [name] [name], female    DOB: [birth year], 77 y.o.   MRN: [id number]  Chief Complaint  Patient presents with   Neck Pain    HPI: Patient is in today for left sided neck pain x 1 month, constant pain, no known injury, nothing makes it worse or better, icy hot does not help. Neck exercises do not help, Turning head to the right does not hurt, turning head to the left does hurt, will get headaches on the left of side in temporal area. Left eye itchy, clear discharge, Blurry vision. Neck and vision issues have been going on for months but worsened in the last month.   Sometimes left arm will feel heavy. No chest pain, shortness of breath, nausea, vomiting, diaphoresis not specifically with arm heaviness.  Past Medical History:  Diagnosis Date   Anxiety    Aortic atherosclerosis (HCC)    Ascending aorta dilation (HCC)    a.) TTE 09/02/19 --> mild; measured 39mm.   Asthma    BMI 24.0-24.9, adult 02/14/2022   CAD (coronary artery disease)    a,) CTA 08/24/20 --> mild CAD (25-49%) in RCA, LAD, LCx; CAC/Agatston score 509.   Cataract    Chronic left shoulder pain 09/27/2015   COPD (chronic obstructive pulmonary disease) (HCC)    Coronary artery disease involving native coronary artery of native heart without angina pectoris 11/18/2020   Degenerative joint disease involving multiple joints 09/28/2017   Depression    Depression, major, single episode, severe (HCC) 03/30/2020   Diastolic dysfunction    a.) TTE 09/02/19 --> LVEF 60-65%; G2DD. b.) TTE 07/23/20 --> LVEF 70-75%; G1DD.   Enlarged aorta (HCC)    Essential hypertension 08/07/2019   GERD (gastroesophageal reflux disease)    GERD with esophagitis 08/07/2019   History of kidney stones    Hyperlipidemia    Hypertension    Long-term current use of opiate analgesic 06/25/2017   Lupus (HCC)    Myocardial infarction (HCC)    Osteoarthritis    PAC (premature atrial contraction)    a.) Holter  12/04/19 --> occassional; 1.1% PAC burden.   Pain of right hip joint 11/22/2015   Paraesophageal hernia    PAT (paroxysmal atrial tachycardia) 02/03/2020   Presbyesophagus    Primary osteoarthritis of left knee 02/04/2015   PSVT (paroxysmal supraventricular tachycardia)    a.) Holter 12/04/19 --> 22 runs.   Renal calculus 12/21/2021   S/P repair of paraesophageal hernia 02/22/2021   S/P total knee arthroplasty 02/15/2015   Schatzki's ring    Sciatica 01/31/2022   Tobacco use 08/19/2019   Type 2 diabetes mellitus with other specified complication (HCC) 08/07/2019    Past Surgical History:  Procedure Laterality Date   ABDOMINAL HYSTERECTOMY     BLADDER SUSPENSION     BREAST BIOPSY Right    COLONOSCOPY  09/04/2011   Colonic polyps, status post polyectomy. Incidental small ascending colon lipoma   ESOPHAGOGASTRODUODENOSCOPY  09/08/2015   Schatzki ring status post esophageal dilitation. Small hiatal hernia   hemorrhoid surgery     KNEE SURGERY     left   TOTAL KNEE ARTHROPLASTY Left 02/15/2015   Procedure: TOTAL KNEE ARTHROPLASTY;  Surgeon: Dannielle Huh, MD;  Location: [facility] OR;  Service: Orthopedics;  Laterality: Left;   TUBAL LIGATION     WRIST SURGERY     right   XI ROBOTIC ASSISTED PARAESOPHAGEAL HERNIA REPAIR N/A 02/22/2021   Procedure: XI ROBOTIC ASSISTED PARAESOPHAGEAL  HERNIA REPAIR with RNFA to assist;  Surgeon: Leafy Ro, MD;  Location: [facility] ORS;  Service: General;  Laterality: N/A;    Family History  Problem Relation Age of Onset   Stroke Mother    Heart disease Mother    COPD Father    Cancer Father        Bone   Diabetes Father    Heart disease Father    Stroke Father    Lupus Sister    Seizures Son    COPD Son    Colon cancer Neg Hx    Esophageal cancer Neg Hx    Stomach cancer Neg Hx    Rectal cancer Neg Hx     Social History   Socioeconomic History   Marital status: Widowed    Spouse name: Not on file   Number of children: 4   Years of education: Not on  file   Highest education level: Not on file  Occupational History   Occupation: Retired  Tobacco Use   Smoking status: Every Day    Packs/day: 1.00    Years: 48.00    Additional pack years: 0.00    Total pack years: 48.00    Types: Cigarettes   Smokeless tobacco: Never  Vaping Use   Vaping Use: Never used  Substance and Sexual Activity   Alcohol use: No   Drug use: No   Sexual activity: Not Currently  Other Topics Concern   Not on file  Social History Narrative   Not on file   Social Determinants of Health   Financial Resource Strain: Low Risk  (05/11/2022)   Overall Financial Resource Strain (CARDIA)    Difficulty of Paying Living Expenses: Not hard at all  Food Insecurity: No Food Insecurity (08/01/2022)   Hunger Vital Sign    Worried About Running Out of Food in the Last Year: Never true    Ran Out of Food in the Last Year: Never true  Transportation Needs: Unmet Transportation Needs (08/01/2022)   PRAPARE - Transportation    Lack of Transportation (Medical): Yes    Lack of Transportation (Non-Medical): Yes  Physical Activity: Inactive (08/01/2022)   Exercise Vital Sign    Days of Exercise per Week: 0 days    Minutes of Exercise per Session: 0 min  Stress: Stress Concern Present (08/01/2022)   [employer] of Occupational Health - Occupational Stress Questionnaire    Feeling of Stress : To some extent  Social Connections: Socially Isolated (05/11/2022)   Social Connection and Isolation Panel [NHANES]    Frequency of Communication with Friends and Family: More than three times a week    Frequency of Social Gatherings with Friends and Family: Twice a week    Attends Religious Services: Never    Database administrator or Organizations: No    Attends Banker Meetings: Never    Marital Status: Widowed  Intimate Partner Violence: Not At Risk (08/01/2022)   Humiliation, Afraid, Rape, and Kick questionnaire    Fear of Current or Ex-Partner: No    Emotionally  Abused: No    Physically Abused: No    Sexually Abused: No    Outpatient Medications Prior to Visit  Medication Sig Dispense Refill   albuterol (PROVENTIL) (2.5 MG/3ML) 0.083% nebulizer solution Take 3 mLs (2.5 mg total) by nebulization every 6 (six) hours as needed for wheezing or shortness of breath. 120 mL 6   aspirin EC 81 MG tablet Take 1 tablet (81 mg total)  by mouth daily. Swallow whole. 90 tablet 3   Budeson-Glycopyrrol-Formoterol 160-9-4.8 MCG/ACT AERO Inhale 2 puffs into the lungs 2 (two) times daily as needed (shortness of breath). 10.7 g 2   buPROPion (WELLBUTRIN XL) 150 MG 24 hr tablet TAKE 1 TABLET(150 MG) BY MOUTH DAILY 90 tablet 2   cloNIDine (CATAPRES) 0.1 MG tablet TAKE ONE TABLET BY MOUTH EVERY MORNING and TAKE ONE TABLET BY MOUTH EVERYDAY AT BEDTIME 180 tablet 3   gabapentin (NEURONTIN) 300 MG capsule Take 1 capsule (300 mg total) by mouth 2 (two) times daily. 60 capsule 3   ibuprofen (ADVIL) 800 MG tablet TAKE ONE TABLET BY MOUTH every EIGHT hours AS NEEDED FOR moderate pain 90 tablet 2   isosorbide mononitrate (IMDUR) 30 MG 24 hr tablet Take 0.5 tablets (15 mg total) by mouth daily. 45 tablet 2   lisinopril-hydrochlorothiazide (ZESTORETIC) 20-12.5 MG tablet Take 2 tablets by mouth every morning. 180 tablet 1   meclizine (ANTIVERT) 25 MG tablet Take 1 tablet (25 mg total) by mouth 3 (three) times daily as needed for dizziness. 180 tablet 1   nitroGLYCERIN (NITROSTAT) 0.4 MG SL tablet Place 0.4 mg under the tongue every 5 (five) minutes as needed for chest pain.     omeprazole (PRILOSEC) 40 MG capsule Take 1 capsule (40 mg total) by mouth every morning. 90 capsule 1   promethazine (PHENERGAN) 25 MG tablet TAKE ONE TABLET BY MOUTH every SIX hours AS NEEDED FOR NAUSEA AND VOMITING 30 tablet 3   rosuvastatin (CRESTOR) 10 MG tablet TAKE ONE TABLET BY MOUTH EVERYDAY AT BEDTIME 90 tablet 1   traZODone (DESYREL) 100 MG tablet Take 1 tablet (100 mg total) by mouth at bedtime. 90  tablet 1   Vitamin D, Ergocalciferol, (DRISDOL) 1.25 MG (50000 UNIT) CAPS capsule Take 1 capsule (50,000 Units total) by mouth every 7 (seven) days. 12 capsule 0   diazepam (VALIUM) 5 MG tablet TAKE ONE TABLET BY MOUTH EVERY TWELVE HOURS AS NEEDED FOR ANXIETY. watch FOR sedation 60 tablet 3   promethazine (PHENERGAN) 25 MG tablet TAKE ONE TABLET BY MOUTH every SIX hours AS NEEDED FOR NAUSEA AND VOMITING 30 tablet 3   No facility-administered medications prior to visit.    Allergies  Allergen Reactions   Meperidine Anaphylaxis and Other (See Comments)    Blood pressure dropped, also   Meperidine Hcl Anaphylaxis and Other (See Comments)    B/P dropped, also   Influenza Vaccines Other (See Comments)    Flu-like symptoms   Other Rash and Other (See Comments)    asparagus    Review of Systems  Constitutional:  Negative for chills, fatigue and fever.  Eyes:  Negative for visual disturbance.  Neurological:  Negative for dizziness, weakness, light-headedness and headaches.       Objective:        09/13/2022    1:59 PM 06/26/2022   11:16 AM 02/22/2022    3:05 PM  Vitals with BMI  Height 5\' 2"  5\' 2"  5\' 2"   Weight 133 lbs 132 lbs 138 lbs  BMI 24.32 24.14 25.23  Systolic 108 124 161  Diastolic 58 62 60  Pulse 98 62 63    No data found.   Physical Exam  Health Maintenance Due  Topic Date Due   OPHTHALMOLOGY EXAM  Never done   Hepatitis C Screening  Never done   DTaP/Tdap/Td (1 - Tdap) Never done   Zoster Vaccines- Shingrix (1 of 2) Never done   FOOT EXAM  05/05/2022   HEMOGLOBIN A1C  08/15/2022   Diabetic kidney evaluation - Urine ACR  10/13/2022    There are no preventive care reminders to display for this patient.   Lab Results  Component Value Date   TSH 1.330 09/13/2022   Lab Results  Component Value Date   WBC 5.1 09/13/2022   HGB 13.4 09/13/2022   HCT 37.9 09/13/2022   MCV 91 09/13/2022   PLT 208 09/13/2022   Lab Results  Component Value Date   NA 132  (L) 09/13/2022   K 3.9 09/13/2022   CO2 26 09/13/2022   GLUCOSE 74 09/13/2022   BUN 10 09/13/2022   CREATININE 0.58 09/13/2022   BILITOT 0.5 09/13/2022   ALKPHOS 77 09/13/2022   AST 16 09/13/2022   ALT 13 09/13/2022   PROT 6.3 09/13/2022   ALBUMIN 4.3 09/13/2022   CALCIUM 10.0 09/13/2022   ANIONGAP 6 10/15/2021   EGFR 94 09/13/2022   Lab Results  Component Value Date   CHOL 177 06/26/2022   Lab Results  Component Value Date   HDL 69 06/26/2022   Lab Results  Component Value Date   LDLCALC 97 06/26/2022   Lab Results  Component Value Date   TRIG 57 06/26/2022   Lab Results  Component Value Date   CHOLHDL 2.6 06/26/2022   Lab Results  Component Value Date   HGBA1C 5.2 02/14/2022       Assessment & Plan:  Neck pain Assessment & Plan: Start prednisone 50 mg daily. X 5 days for neck and arm pain.  Get neck xray.   Orders: -     DG Cervical Spine Complete; Future -     predniSONE; Take 1 tablet (50 mg total) by mouth daily with breakfast.  Dispense: 5 tablet; Refill: 0  Temporal headache Assessment & Plan: Start prednisone 50 mg daily. X 5 days for temporal arteritis (temporal headache) Check labs.   Orders: -     Sedimentation rate -     C-reactive protein  Left arm pain Assessment & Plan: Start prednisone 50 mg daily. X 5 days for neck and arm pain. This will also cover you for temporal arteritis (temporal headache) if needed.    Ordered EKG   Orders: -     EKG 12-Lead  Dizziness Assessment & Plan: Check labs.  Orders: -     CBC with Differential/Platelet -     Comprehensive metabolic panel -     T4, free -     TSH  Hair loss Assessment & Plan: Start finasteride 1 mg once daily for hair loss. Check labs.   Orders: -     Finasteride; Take 1 tablet (1 mg total) by mouth daily.  Dispense: 30 tablet; Refill: 3 -     Ferritin    Total time spent on today's visit was greater than 40 minutes, including both face-to-face time and  nonface-to-face time personally spent on review of chart (labs and imaging), discussing labs and goals, discussing further work-up, treatment options, referrals to specialist if needed, reviewing outside records of pertinent, answering patient's questions, and coordinating care.  Meds ordered this encounter  Medications   predniSONE (DELTASONE) 50 MG tablet    Sig: Take 1 tablet (50 mg total) by mouth daily with breakfast.    Dispense:  5 tablet    Refill:  0   finasteride (PROPECIA) 1 MG tablet    Sig: Take 1 tablet (1 mg total) by mouth daily.    Dispense:  30 tablet    Refill:  3    Orders Placed This Encounter  Procedures   DG Cervical Spine Complete   Sedimentation Rate   C-reactive protein   CBC with Differential/Platelet   Comprehensive metabolic panel   T4, free   TSH   Ferritin   EKG 12-Lead     Follow-up: No follow-ups on file.  An After Visit Summary was printed and given to the patient.   Clayborn Bigness I Leal-Borjas,acting as a scribe for Blane Ohara, MD.,have documented all relevant documentation on the behalf of Blane Ohara, MD,as directed by  Blane Ohara, MD while in the presence of Blane Ohara, MD.   Blane Ohara, MD [facility] [phone]

## 2022-09-14 LAB — SEDIMENTATION RATE: Sed Rate: 2 mm/hr (ref 0–40)

## 2022-09-14 LAB — COMPREHENSIVE METABOLIC PANEL
ALT: 13 IU/L (ref 0–32)
AST: 16 IU/L (ref 0–40)
Albumin/Globulin Ratio: 2.2 (ref 1.2–2.2)
Albumin: 4.3 g/dL (ref 3.8–4.8)
Alkaline Phosphatase: 77 IU/L (ref 44–121)
BUN/Creatinine Ratio: 17 (ref 12–28)
BUN: 10 mg/dL (ref 8–27)
Bilirubin Total: 0.5 mg/dL (ref 0.0–1.2)
CO2: 26 mmol/L (ref 20–29)
Calcium: 10 mg/dL (ref 8.7–10.3)
Chloride: 91 mmol/L — ABNORMAL LOW (ref 96–106)
Creatinine, Ser: 0.58 mg/dL (ref 0.57–1.00)
Globulin, Total: 2 g/dL (ref 1.5–4.5)
Glucose: 74 mg/dL (ref 70–99)
Potassium: 3.9 mmol/L (ref 3.5–5.2)
Sodium: 132 mmol/L — ABNORMAL LOW (ref 134–144)
Total Protein: 6.3 g/dL (ref 6.0–8.5)
eGFR: 94 mL/min/{1.73_m2} (ref >59)

## 2022-09-14 LAB — CBC WITH DIFFERENTIAL/PLATELET
Basophils Absolute: 0.1 10*3/uL (ref 0.0–0.2)
Basos: 1 %
EOS (ABSOLUTE): 0.1 10*3/uL (ref 0.0–0.4)
Eos: 1 %
Hematocrit: 37.9 % (ref 34.0–46.6)
Hemoglobin: 13.4 g/dL (ref 11.1–15.9)
Immature Grans (Abs): 0 10*3/uL (ref 0.0–0.1)
Immature Granulocytes: 0 %
Lymphocytes Absolute: 1.8 10*3/uL (ref 0.7–3.1)
Lymphs: 35 %
MCH: 32.2 pg (ref 26.6–33.0)
MCHC: 35.4 g/dL (ref 31.5–35.7)
MCV: 91 fL (ref 79–97)
Monocytes Absolute: 0.5 10*3/uL (ref 0.1–0.9)
Monocytes: 9 %
Neutrophils Absolute: 2.7 10*3/uL (ref 1.4–7.0)
Neutrophils: 54 %
Platelets: 208 10*3/uL (ref 150–450)
RBC: 4.16 x10E6/uL (ref 3.77–5.28)
RDW: 12.2 % (ref 11.7–15.4)
WBC: 5.1 10*3/uL (ref 3.4–10.8)

## 2022-09-14 LAB — C-REACTIVE PROTEIN: CRP: <1 mg/L (ref 0–10)

## 2022-09-14 LAB — T4, FREE: Free T4: 1.38 ng/dL (ref 0.82–1.77)

## 2022-09-14 LAB — TSH: TSH: 1.33 u[IU]/mL (ref 0.450–4.500)

## 2022-09-14 LAB — FERRITIN: Ferritin: 138 ng/mL (ref 15–150)

## 2022-09-15 ENCOUNTER — Other Ambulatory Visit: Payer: Self-pay

## 2022-09-15 ENCOUNTER — Ambulatory Visit: Payer: 59 | Admitting: Family Medicine

## 2022-09-15 DIAGNOSIS — R42 Dizziness and giddiness: Secondary | ICD-10-CM | POA: Insufficient documentation

## 2022-09-15 DIAGNOSIS — M542 Cervicalgia: Secondary | ICD-10-CM

## 2022-09-15 DIAGNOSIS — R519 Headache, unspecified: Secondary | ICD-10-CM

## 2022-09-15 DIAGNOSIS — L659 Nonscarring hair loss, unspecified: Secondary | ICD-10-CM | POA: Insufficient documentation

## 2022-09-15 DIAGNOSIS — M47812 Spondylosis without myelopathy or radiculopathy, cervical region: Secondary | ICD-10-CM | POA: Diagnosis not present

## 2022-09-15 HISTORY — DX: Headache, unspecified: R51.9

## 2022-09-15 HISTORY — DX: Cervicalgia: M54.2

## 2022-09-15 HISTORY — DX: Nonscarring hair loss, unspecified: L65.9

## 2022-09-15 NOTE — Assessment & Plan Note (Signed)
Check labs 

## 2022-09-15 NOTE — Assessment & Plan Note (Addendum)
Start prednisone 50 mg daily. X 5 days for temporal arteritis (temporal headache) Check labs.

## 2022-09-15 NOTE — Assessment & Plan Note (Addendum)
Start finasteride 1 mg once daily for hair loss. Check labs.

## 2022-09-15 NOTE — Assessment & Plan Note (Signed)
Start prednisone 50 mg daily. X 5 days for neck and arm pain.  Get neck xray.

## 2022-09-15 NOTE — Assessment & Plan Note (Addendum)
Start prednisone 50 mg daily. X 5 days for neck and arm pain. This will also cover you for temporal arteritis (temporal headache) if needed.    Ordered EKG: NSR. No st changes.

## 2022-09-17 ENCOUNTER — Encounter: Payer: Self-pay | Admitting: Family Medicine

## 2022-09-21 ENCOUNTER — Other Ambulatory Visit: Payer: Self-pay | Admitting: Family Medicine

## 2022-09-28 ENCOUNTER — Ambulatory Visit (INDEPENDENT_AMBULATORY_CARE_PROVIDER_SITE_OTHER): Payer: 59 | Admitting: Family Medicine

## 2022-09-28 ENCOUNTER — Encounter: Payer: Self-pay | Admitting: Family Medicine

## 2022-09-28 VITALS — BP 122/68 | HR 67 | Temp 96.9°F | Ht 62.0 in | Wt 132.0 lb

## 2022-09-28 DIAGNOSIS — K219 Gastro-esophageal reflux disease without esophagitis: Secondary | ICD-10-CM | POA: Diagnosis not present

## 2022-09-28 DIAGNOSIS — R310 Gross hematuria: Secondary | ICD-10-CM

## 2022-09-28 DIAGNOSIS — I152 Hypertension secondary to endocrine disorders: Secondary | ICD-10-CM

## 2022-09-28 DIAGNOSIS — I10 Essential (primary) hypertension: Secondary | ICD-10-CM

## 2022-09-28 DIAGNOSIS — E1169 Type 2 diabetes mellitus with other specified complication: Secondary | ICD-10-CM

## 2022-09-28 DIAGNOSIS — F322 Major depressive disorder, single episode, severe without psychotic features: Secondary | ICD-10-CM

## 2022-09-28 DIAGNOSIS — E782 Mixed hyperlipidemia: Secondary | ICD-10-CM | POA: Diagnosis not present

## 2022-09-28 DIAGNOSIS — G894 Chronic pain syndrome: Secondary | ICD-10-CM | POA: Diagnosis not present

## 2022-09-28 DIAGNOSIS — E559 Vitamin D deficiency, unspecified: Secondary | ICD-10-CM

## 2022-09-28 DIAGNOSIS — F330 Major depressive disorder, recurrent, mild: Secondary | ICD-10-CM

## 2022-09-28 DIAGNOSIS — F4321 Adjustment disorder with depressed mood: Secondary | ICD-10-CM

## 2022-09-28 DIAGNOSIS — E1159 Type 2 diabetes mellitus with other circulatory complications: Secondary | ICD-10-CM | POA: Diagnosis not present

## 2022-09-28 DIAGNOSIS — Z87891 Personal history of nicotine dependence: Secondary | ICD-10-CM | POA: Diagnosis not present

## 2022-09-28 DIAGNOSIS — J410 Simple chronic bronchitis: Secondary | ICD-10-CM | POA: Diagnosis not present

## 2022-09-28 MED ORDER — DIAZEPAM 5 MG PO TABS
ORAL_TABLET | ORAL | 0 refills | Status: DC
Start: 2022-09-28 — End: 2022-10-13

## 2022-09-28 NOTE — Assessment & Plan Note (Addendum)
Complications include hyperlipidemia Distant diagnosis 2-3 years ago, but is well controlled now.   Continue to work on eating a healthy diet and exercise.

## 2022-09-28 NOTE — Assessment & Plan Note (Addendum)
Well controlled.  Continue rosuvastatin 10 mg before bed.  Recommend continue to work on eating healthy diet and exercise.

## 2022-09-28 NOTE — Assessment & Plan Note (Signed)
The current medical regimen is effective;  continue present plan and medications. Continue omeprazole 40 mg daily.  

## 2022-09-28 NOTE — Assessment & Plan Note (Signed)
Well controlled.  Continue Breztri 2 puffs twice daily, Albuterol nebulizer  2.5 mg every 6 hours, albuterol inhaler 2 puffs twice a day.  Recommend cessation.  

## 2022-09-28 NOTE — Assessment & Plan Note (Signed)
>>  ASSESSMENT AND PLAN FOR GERD (GASTROESOPHAGEAL REFLUX DISEASE) WRITTEN ON 09/28/2022  2:36 PM BY BRAMBLETT, KATHERINA A, CMA  The current medical regimen is effective;  continue present plan and medications. Continue omeprazole 40 mg daily.

## 2022-09-28 NOTE — Progress Notes (Signed)
Subjective:  Patient ID: [name] [name], female    DOB: 23-Oct-1945  Age: 77 y.o. MRN: [id number]  Chief Complaint  Patient presents with   Medical Management of Chronic Issues    HPI Hyperlipidemia.   Medications: Rosuvastatin 10 mg daily without problems.    Hypertension.   Medications: Lisinopril-Hctz 20-12.5 mg 2 tablets every morning, Clonidine 0.1 twice a day , aspirin 81 mg daily well without side effects.    COPD: Breztri 2 puffs twice daily, Albuterol nebulizer  2.5 mg every 6 hours as needed , albuterol inhaler 2 puffs twice a day. Tobacco 1 ppd. Uses albuterol about once daily.    GERD: Omeprazole 40 mg daily still gets GERD and uses every day.    CORONARY ARTERY DISEASE: non obstructive CORONARY ARTERY DISEASE. Patient sees Dr. Tomie China.  On rosuvastatin 10 mg before bed and Baby asprin. .    Depression/GAD: Wellbutrin xl 150 mg one in am. Patient intolerant to zoloft.  Takes diazepam 5 mg once daily for panic attacks.  Trazodone 100 mg before bed for insomnia.    Chronic pain syndrome:  Mid thoracic back pain and radiates to lumbar region and then goes around to the sides.  Patient saw [facility]. Abnormal lumbar MRI. They recommended cspine mri.  Taking ibuprofen 800 mg three times a day. No tylenol.  9/10 daily.  Previously seen by Dr. Ethelene Hal for pain clinic. Has appt with Pain Clinic 10/05/2022 at 1:15.     08/01/2022    3:48 PM 06/26/2022   11:09 AM 02/14/2022    2:55 PM 11/03/2021   11:29 AM 08/11/2021   11:14 AM  Depression screen PHQ 2/9  Decreased Interest 0 0 1 1 1   Down, Depressed, Hopeless 1 1 2 1 3   PHQ - 2 Score 1 1 3 2 4   Altered sleeping 3 3 2 3 3   Tired, decreased energy 3 3 3 3 3   Change in appetite 2 2 3 3 3   Feeling bad or failure about yourself  0 0 2 0 3  Trouble concentrating 2 2 2 3 3   Moving slowly or fidgety/restless 1 1 2 1 3   Suicidal thoughts 0 0 0 0 0  PHQ-9 Score 12 12 17 15 22   Difficult doing work/chores Not difficult  at all Not difficult at all Somewhat difficult Not difficult at all Very difficult        08/01/2022    3:53 PM  Fall Risk   Falls in the past year? 1  Number falls in past yr: 1  Injury with Fall? 1  Risk for fall due to : History of fall(s);Impaired balance/gait;Other (Comment)  Risk for fall due to: Comment pain  Follow up Falls evaluation completed;Education provided;Falls prevention discussed    Patient Care Team: Blane Ohara, MD as PCP - General (Internal Medicine) Thomasene Ripple, DO as PCP - Cardiology (Cardiology) Zettie Pho, [facility] as Pharmacist (Pharmacist) Marlowe Sax, RN as Case Manager (General Practice)   Review of Systems  Constitutional:  Negative for chills, fatigue and fever.  HENT:  Negative for congestion, ear pain, rhinorrhea and sore throat.   Respiratory:  Negative for cough and shortness of breath.   Cardiovascular:  Negative for chest pain.  Gastrointestinal:  Positive for abdominal pain. Negative for constipation, diarrhea, nausea and vomiting.  Genitourinary:  Negative for dysuria, hematuria and urgency.  Musculoskeletal:  Positive for back pain. Negative for myalgias.  Neurological:  Negative for dizziness, weakness, light-headedness and headaches.  Psychiatric/Behavioral:  Negative for dysphoric mood. The patient is not nervous/anxious.     Current Outpatient Medications on File Prior to Visit  Medication Sig Dispense Refill   albuterol (PROVENTIL) (2.5 MG/3ML) 0.083% nebulizer solution Take 3 mLs (2.5 mg total) by nebulization every 6 (six) hours as needed for wheezing or shortness of breath. 120 mL 6   aspirin EC 81 MG tablet Take 1 tablet (81 mg total) by mouth daily. Swallow whole. 90 tablet 3   Budeson-Glycopyrrol-Formoterol 160-9-4.8 MCG/ACT AERO Inhale 2 puffs into the lungs 2 (two) times daily as needed (shortness of breath). 10.7 g 2   buPROPion (WELLBUTRIN XL) 150 MG 24 hr tablet TAKE 1 TABLET(150 MG) BY MOUTH DAILY 90 tablet 2    cloNIDine (CATAPRES) 0.1 MG tablet TAKE ONE TABLET BY MOUTH EVERY MORNING and TAKE ONE TABLET BY MOUTH EVERYDAY AT BEDTIME 180 tablet 3   finasteride (PROPECIA) 1 MG tablet Take 1 tablet (1 mg total) by mouth daily. 30 tablet 3   ibuprofen (ADVIL) 800 MG tablet TAKE ONE TABLET BY MOUTH every EIGHT hours AS NEEDED FOR moderate pain 90 tablet 2   isosorbide mononitrate (IMDUR) 30 MG 24 hr tablet Take 0.5 tablets (15 mg total) by mouth daily. 45 tablet 2   lisinopril-hydrochlorothiazide (ZESTORETIC) 20-12.5 MG tablet Take 2 tablets by mouth every morning. 180 tablet 1   meclizine (ANTIVERT) 25 MG tablet Take 1 tablet (25 mg total) by mouth 3 (three) times daily as needed for dizziness. 180 tablet 1   nitroGLYCERIN (NITROSTAT) 0.4 MG SL tablet Place 0.4 mg under the tongue every 5 (five) minutes as needed for chest pain.     omeprazole (PRILOSEC) 40 MG capsule Take 1 capsule (40 mg total) by mouth every morning. 90 capsule 1   promethazine (PHENERGAN) 25 MG tablet TAKE ONE TABLET BY MOUTH every SIX hours AS NEEDED FOR NAUSEA AND VOMITING 30 tablet 3   rosuvastatin (CRESTOR) 10 MG tablet TAKE ONE TABLET BY MOUTH EVERYDAY AT BEDTIME 90 tablet 1   traZODone (DESYREL) 100 MG tablet Take 1 tablet (100 mg total) by mouth at bedtime. 90 tablet 1   Vitamin D, Ergocalciferol, (DRISDOL) 1.25 MG (50000 UNIT) CAPS capsule TAKE ONE CAPSULE BY MOUTH EVERY 7 DAYS 12 capsule 0   No current facility-administered medications on file prior to visit.   Past Medical History:  Diagnosis Date   Anxiety    Aortic atherosclerosis (HCC)    Ascending aorta dilation (HCC)    a.) TTE 09/02/19 --> mild; measured 39mm.   Asthma    BMI 24.0-24.9, adult 02/14/2022   CAD (coronary artery disease)    a,) CTA 08/24/20 --> mild CAD (25-49%) in RCA, LAD, LCx; CAC/Agatston score 509.   Cataract    Chronic left shoulder pain 09/27/2015   COPD (chronic obstructive pulmonary disease) (HCC)    Coronary artery disease involving native  coronary artery of native heart without angina pectoris 11/18/2020   Degenerative joint disease involving multiple joints 09/28/2017   Depression    Depression, major, single episode, severe (HCC) 03/30/2020   Diastolic dysfunction    a.) TTE 09/02/19 --> LVEF 60-65%; G2DD. b.) TTE 07/23/20 --> LVEF 70-75%; G1DD.   Enlarged aorta (HCC)    Essential hypertension 08/07/2019   GERD (gastroesophageal reflux disease)    GERD with esophagitis 08/07/2019   History of kidney stones    Hyperlipidemia    Hypertension    Long-term current use of opiate analgesic 06/25/2017   Lupus (HCC)  Myocardial infarction (HCC)    Osteoarthritis    PAC (premature atrial contraction)    a.) Holter 12/04/19 --> occassional; 1.1% PAC burden.   Pain of right hip joint 11/22/2015   Paraesophageal hernia    PAT (paroxysmal atrial tachycardia) 02/03/2020   Presbyesophagus    Primary osteoarthritis of left knee 02/04/2015   PSVT (paroxysmal supraventricular tachycardia)    a.) Holter 12/04/19 --> 22 runs.   Renal calculus 12/21/2021   S/P repair of paraesophageal hernia 02/22/2021   S/P total knee arthroplasty 02/15/2015   Schatzki's ring    Sciatica 01/31/2022   Tobacco use 08/19/2019   Type 2 diabetes mellitus with other specified complication (HCC) 08/07/2019   Past Surgical History:  Procedure Laterality Date   ABDOMINAL HYSTERECTOMY     BLADDER SUSPENSION     BREAST BIOPSY Right    COLONOSCOPY  09/04/2011   Colonic polyps, status post polyectomy. Incidental small ascending colon lipoma   ESOPHAGOGASTRODUODENOSCOPY  09/08/2015   Schatzki ring status post esophageal dilitation. Small hiatal hernia   hemorrhoid surgery     KNEE SURGERY     left   TOTAL KNEE ARTHROPLASTY Left 02/15/2015   Procedure: TOTAL KNEE ARTHROPLASTY;  Surgeon: Dannielle Huh, MD;  Location: [facility] OR;  Service: Orthopedics;  Laterality: Left;   TUBAL LIGATION     WRIST SURGERY     right   XI ROBOTIC ASSISTED PARAESOPHAGEAL HERNIA REPAIR N/A  02/22/2021   Procedure: XI ROBOTIC ASSISTED PARAESOPHAGEAL HERNIA REPAIR with RNFA to assist;  Surgeon: Leafy Ro, MD;  Location: [facility] ORS;  Service: General;  Laterality: N/A;    Family History  Problem Relation Age of Onset   Stroke Mother    Heart disease Mother    COPD Father    Cancer Father        Bone   Diabetes Father    Heart disease Father    Stroke Father    Lupus Sister    Seizures Son    COPD Son    Colon cancer Neg Hx    Esophageal cancer Neg Hx    Stomach cancer Neg Hx    Rectal cancer Neg Hx    Social History   Socioeconomic History   Marital status: Widowed    Spouse name: Not on file   Number of children: 4   Years of education: Not on file   Highest education level: Not on file  Occupational History   Occupation: Retired  Tobacco Use   Smoking status: Every Day    Packs/day: 1.00    Years: 48.00    Additional pack years: 0.00    Total pack years: 48.00    Types: Cigarettes   Smokeless tobacco: Never  Vaping Use   Vaping Use: Never used  Substance and Sexual Activity   Alcohol use: No   Drug use: No   Sexual activity: Not Currently  Other Topics Concern   Not on file  Social History Narrative   Not on file   Social Determinants of Health   Financial Resource Strain: Low Risk  (05/11/2022)   Overall Financial Resource Strain (CARDIA)    Difficulty of Paying Living Expenses: Not hard at all  Food Insecurity: No Food Insecurity (08/01/2022)   Hunger Vital Sign    Worried About Running Out of Food in the Last Year: Never true    Ran Out of Food in the Last Year: Never true  Transportation Needs: Unmet Transportation Needs (08/01/2022)   PRAPARE -  Administrator, Civil Service (Medical): Yes    Lack of Transportation (Non-Medical): Yes  Physical Activity: Inactive (08/01/2022)   Exercise Vital Sign    Days of Exercise per Week: 0 days    Minutes of Exercise per Session: 0 min  Stress: Stress Concern Present (08/01/2022)   [employer] of Occupational [employer] - Occupational Stress Questionnaire    Feeling of Stress : To some extent  Social Connections: Socially Isolated (05/11/2022)   Social Connection and Isolation Panel [NHANES]    Frequency of Communication with Friends and Family: More than three times a week    Frequency of Social Gatherings with Friends and Family: Twice a week    Attends Religious Services: Never    Database administrator or Organizations: No    Attends Banker Meetings: Never    Marital Status: Widowed    Objective:  BP 122/68   Pulse 67   Temp (!) 96.9 F (36.1 C)   Ht 5\' 2"  (1.575 m)   Wt 132 lb (59.9 kg)   SpO2 99%   BMI 24.14 kg/m      09/28/2022    2:27 PM 09/13/2022    1:59 PM 06/26/2022   11:16 AM  BP/Weight  Systolic BP 122 108 124  Diastolic BP 68 58 62  Wt. (Lbs) 132 133 132  BMI 24.14 kg/m2 24.33 kg/m2 24.14 kg/m2    Physical Exam Vitals reviewed.  Constitutional:      Appearance: Normal appearance. She is normal weight.  Neck:     Vascular: No carotid bruit.  Cardiovascular:     Rate and Rhythm: Normal rate and regular rhythm.     Heart sounds: Normal heart sounds.  Pulmonary:     Effort: Pulmonary effort is normal. No respiratory distress.     Breath sounds: Normal breath sounds.  Abdominal:     General: Abdomen is flat. Bowel sounds are normal.     Palpations: Abdomen is soft.     Tenderness: There is no abdominal tenderness.  Musculoskeletal:        General: Tenderness (thoracic and lumbar paraspinal muscles.) present.  Neurological:     Mental Status: She is alert and oriented to person, place, and time.  Psychiatric:        Mood and Affect: Mood normal.        Behavior: Behavior normal.     Diabetic Foot Exam - Simple   No data filed      Lab Results  Component Value Date   WBC 5.1 09/13/2022   HGB 13.4 09/13/2022   HCT 37.9 09/13/2022   PLT 208 09/13/2022   GLUCOSE 86 09/28/2022   CHOL 177 06/26/2022   TRIG 57  06/26/2022   HDL 69 06/26/2022   LDLCALC 97 06/26/2022   ALT 14 09/28/2022   AST 15 09/28/2022   NA 129 (L) 09/28/2022   K 3.4 (L) 09/28/2022   CL 90 (L) 09/28/2022   CREATININE 0.68 09/28/2022   BUN 7 (L) 09/28/2022   CO2 25 09/28/2022   TSH 1.330 09/13/2022   INR 1.02 01/28/2015   HGBA1C 5.2 02/14/2022      Assessment & Plan:    Hypertension associated with diabetes (HCC) Assessment & Plan: Well controlled.  No changes to medicines. Lisinopril-Hctz 20-12.5 mg 2 tablets every morning, Clonidine 0.1 twice a day , aspirin 81 mg daily . Continue to work on eating a healthy diet and exercise.    Orders: -  Comprehensive metabolic panel  Personal history of nicotine dependence Assessment & Plan: Recommend cessation. Refuses.    Gastroesophageal reflux disease without esophagitis Assessment & Plan: The current medical regimen is effective;  continue present plan and medications. Continue omeprazole 40 mg daily.    Mixed hyperlipidemia Assessment & Plan: Well controlled.  Continue rosuvastatin 10 mg before bed.  Recommend continue to work on eating healthy diet and exercise.    Simple chronic bronchitis (HCC) Assessment & Plan: Well controlled.  Continue Breztri 2 puffs twice daily, Albuterol nebulizer  2.5 mg every 6 hours, albuterol inhaler 2 puffs twice a day.  Recommend cessation.    Grief -     diazePAM; TAKE ONE TABLET BY MOUTH daily  AS NEEDED FOR ANXIETY. watch FOR sedation  Dispense: 30 tablet; Refill: 0  Mild vitamin D deficiency Assessment & Plan: Check vitamin D.  Orders: -     VITAMIN D 25 Hydroxy (Vit-D Deficiency, Fractures)  Depression, major, recurrent, mild (HCC) Assessment & Plan: Continue wellbutrin xl 150 mg once daily in am. Decrease valium to 5 mg once daily as needed    Chronic pain syndrome Assessment & Plan: Defer management to pain clinic.  Decrease valium to 5 mg once daily.  Continue to try to wean.       Meds  ordered this encounter  Medications   diazepam (VALIUM) 5 MG tablet    Sig: TAKE ONE TABLET BY MOUTH daily  AS NEEDED FOR ANXIETY. watch FOR sedation    Dispense:  30 tablet    Refill:  0    Cancel previous diazepam rxs.    Orders Placed This Encounter  Procedures   Comprehensive metabolic panel   VITAMIN D 25 Hydroxy (Vit-D Deficiency, Fractures)     Follow-up: Return in about 3 months (around 12/29/2022) for chronic fasting.   I,Katherina A Bramblett,acting as a scribe for Blane Ohara, MD.,have documented all relevant documentation on the behalf of Blane Ohara, MD,as directed by  Blane Ohara, MD while in the presence of Blane Ohara, MD.   Clayborn Bigness I Leal-Borjas,acting as a scribe for Blane Ohara, MD.,have documented all relevant documentation on the behalf of Blane Ohara, MD,as directed by  Blane Ohara, MD while in the presence of Blane Ohara, MD.    An After Visit Summary was printed and given to the patient.  Blane Ohara, MD [facility] [phone]

## 2022-09-29 LAB — COMPREHENSIVE METABOLIC PANEL
ALT: 14 IU/L (ref 0–32)
AST: 15 IU/L (ref 0–40)
Albumin/Globulin Ratio: 1.9 (ref 1.2–2.2)
Albumin: 4 g/dL (ref 3.8–4.8)
Alkaline Phosphatase: 77 IU/L (ref 44–121)
BUN/Creatinine Ratio: 10 — ABNORMAL LOW (ref 12–28)
BUN: 7 mg/dL — ABNORMAL LOW (ref 8–27)
Bilirubin Total: 0.5 mg/dL (ref 0.0–1.2)
CO2: 25 mmol/L (ref 20–29)
Calcium: 9.2 mg/dL (ref 8.7–10.3)
Chloride: 90 mmol/L — ABNORMAL LOW (ref 96–106)
Creatinine, Ser: 0.68 mg/dL (ref 0.57–1.00)
Globulin, Total: 2.1 g/dL (ref 1.5–4.5)
Glucose: 86 mg/dL (ref 70–99)
Potassium: 3.4 mmol/L — ABNORMAL LOW (ref 3.5–5.2)
Sodium: 129 mmol/L — ABNORMAL LOW (ref 134–144)
Total Protein: 6.1 g/dL (ref 6.0–8.5)
eGFR: 90 mL/min/{1.73_m2} (ref >59)

## 2022-09-29 LAB — VITAMIN D 25 HYDROXY (VIT D DEFICIENCY, FRACTURES): Vit D, 25-Hydroxy: 26.1 ng/mL — ABNORMAL LOW (ref 30.0–100.0)

## 2022-10-02 ENCOUNTER — Other Ambulatory Visit: Payer: Self-pay

## 2022-10-02 MED ORDER — POTASSIUM CHLORIDE CRYS ER 10 MEQ PO TBCR: 10.0000 meq | EXTENDED_RELEASE_TABLET | Freq: Every day | ORAL | 0 refills | Status: DC

## 2022-10-03 DIAGNOSIS — F4321 Adjustment disorder with depressed mood: Secondary | ICD-10-CM | POA: Insufficient documentation

## 2022-10-03 DIAGNOSIS — R310 Gross hematuria: Secondary | ICD-10-CM | POA: Insufficient documentation

## 2022-10-03 DIAGNOSIS — E559 Vitamin D deficiency, unspecified: Secondary | ICD-10-CM | POA: Insufficient documentation

## 2022-10-03 DIAGNOSIS — Z87891 Personal history of nicotine dependence: Secondary | ICD-10-CM

## 2022-10-03 DIAGNOSIS — F322 Major depressive disorder, single episode, severe without psychotic features: Secondary | ICD-10-CM | POA: Insufficient documentation

## 2022-10-03 HISTORY — DX: Adjustment disorder with depressed mood: F43.21

## 2022-10-03 HISTORY — DX: Vitamin D deficiency, unspecified: E55.9

## 2022-10-03 HISTORY — DX: Personal history of nicotine dependence: Z87.891

## 2022-10-03 NOTE — Assessment & Plan Note (Signed)
Well controlled.  No changes to medicines. Lisinopril-Hctz 20-12.5 mg 2 tablets every morning, Clonidine 0.1 twice a day , aspirin 81 mg daily . Continue to work on eating a healthy diet and exercise.

## 2022-10-05 ENCOUNTER — Telehealth: Payer: Self-pay

## 2022-10-05 DIAGNOSIS — M5136 Other intervertebral disc degeneration, lumbar region: Secondary | ICD-10-CM | POA: Diagnosis not present

## 2022-10-05 DIAGNOSIS — M48061 Spinal stenosis, lumbar region without neurogenic claudication: Secondary | ICD-10-CM | POA: Diagnosis not present

## 2022-10-05 DIAGNOSIS — M47816 Spondylosis without myelopathy or radiculopathy, lumbar region: Secondary | ICD-10-CM | POA: Diagnosis not present

## 2022-10-05 DIAGNOSIS — G894 Chronic pain syndrome: Secondary | ICD-10-CM | POA: Diagnosis not present

## 2022-10-05 DIAGNOSIS — Z1389 Encounter for screening for other disorder: Secondary | ICD-10-CM | POA: Diagnosis not present

## 2022-10-05 NOTE — Telephone Encounter (Signed)
Patient was seen at Pain clinic in [place] and they told her that she needs to be off of diazepam in order for them to give pain medicine. She said that she only take it as needed. She does not understand why. Please advice.

## 2022-10-07 ENCOUNTER — Encounter: Payer: Self-pay | Admitting: Family Medicine

## 2022-10-07 DIAGNOSIS — G894 Chronic pain syndrome: Secondary | ICD-10-CM

## 2022-10-07 HISTORY — DX: Chronic pain syndrome: G89.4

## 2022-10-07 NOTE — Assessment & Plan Note (Signed)
Defer management to pain clinic.  Decrease valium to 5 mg once daily.  Continue to try to wean.

## 2022-10-07 NOTE — Assessment & Plan Note (Signed)
Recommend cessation. Refuses. ?

## 2022-10-07 NOTE — Assessment & Plan Note (Signed)
Check vitamin D. 

## 2022-10-07 NOTE — Assessment & Plan Note (Signed)
Continue wellbutrin xl 150 mg once daily in am. Decrease valium to 5 mg once daily as needed

## 2022-10-11 ENCOUNTER — Other Ambulatory Visit: Payer: Self-pay

## 2022-10-11 MED ORDER — HYDROXYZINE PAMOATE 25 MG PO CAPS: 25.0000 mg | ORAL_CAPSULE | Freq: Three times a day (TID) | ORAL | 0 refills | Status: DC | PRN

## 2022-10-11 NOTE — Telephone Encounter (Signed)
Patient was informed. She is agreed with the plan. I sent hydroxyzine to the pharmacy.

## 2022-10-12 ENCOUNTER — Ambulatory Visit (INDEPENDENT_AMBULATORY_CARE_PROVIDER_SITE_OTHER): Payer: 59

## 2022-10-12 ENCOUNTER — Telehealth: Payer: Self-pay

## 2022-10-12 ENCOUNTER — Other Ambulatory Visit: Payer: Self-pay

## 2022-10-12 ENCOUNTER — Telehealth: Payer: 59

## 2022-10-12 DIAGNOSIS — F330 Major depressive disorder, recurrent, mild: Secondary | ICD-10-CM

## 2022-10-12 DIAGNOSIS — E782 Mixed hyperlipidemia: Secondary | ICD-10-CM

## 2022-10-12 DIAGNOSIS — R42 Dizziness and giddiness: Secondary | ICD-10-CM

## 2022-10-12 DIAGNOSIS — R296 Repeated falls: Secondary | ICD-10-CM

## 2022-10-12 DIAGNOSIS — F4321 Adjustment disorder with depressed mood: Secondary | ICD-10-CM

## 2022-10-12 MED ORDER — MECLIZINE HCL 25 MG PO TABS
25.0000 mg | ORAL_TABLET | Freq: Three times a day (TID) | ORAL | 1 refills | Status: DC | PRN
Start: 2022-10-12 — End: 2023-01-27

## 2022-10-12 MED ORDER — ROSUVASTATIN CALCIUM 10 MG PO TABS: ORAL_TABLET | ORAL | 1 refills | Status: DC

## 2022-10-12 NOTE — Progress Notes (Signed)
Care Management & Coordination Services Pharmacy Team  Reason for Encounter: Medication coordination and delivery  Contacted patient to discuss medications and coordinate delivery from [facility].  Spoke with patient on 10/12/2022   Cycle dispensing form sent to [name] for review.   Last adherence delivery date:09/25/22      Patient is due for next adherence delivery on: 10/24/22  This delivery to include: Adherence Packaging  30 Days  Isosorbide Monoitrate 30 mg 0.5 tablet at Breakfast  Rosuvastatin 10 mg 1 at bedtime Clonidine 0.1 mg 1 at breakfast and 1 at Bedtime Bupropion XL 150 mg 1 at breakfast Trazodone 100 mg 1 at bedtime prn (Vials) Lisinopril-HCTZ 20-12.5 mg 2 at breakfast Omeprazole 40 mg 1 at breakfast  Meclizine 25mg -1 tab three times daily as needed (Bottles) Vitamin D2 50,000 units-I capsule once a week (Bottles) Phenergan 25mg - 1 tablet every six hours prn (bottles) Finasteride 1mg  1 B Albuterol Inhaler  Acute Fill-Delivery 10/12/22 Hydroxyzine 25mg -Q8H prn  Patient declined the following medications this month: Breztri Inhaler 160-9-4.70mcg-Plenty of supply, prn Asprin 81mg - OTC Nitroglycerin 0.4-Prn, plenty of supply Albuterol Nebulizer- Only prn, plenty of supply Ibuprofen 800mg -No longer needs, following up with pain clinic  Gabapentin 300mg -D/C by provider Diazepam 5mg - Pt was advised to finish what she has and then pt is supposed to D/C  Potassium Chloride 10MEQ-Already received through [facility] on 10/10/22 30ds. Supposed to be on for 30 days  Refills requested from providers include: Albuterol Inhaler Rosuvastatin 10 mg  Meclizine 25mg   Confirmed delivery date of 10/24/22, advised patient that pharmacy will contact them the morning of delivery.   Any concerns about your medications? No  How often do you forget or accidentally miss a dose? Never  Do you use a pillbox? No  Is patient in packaging Yes  If yes  What  is the date on your next pill pack? 10/13/22  Any concerns or issues with your packaging? No concerns   Recent blood pressure readings are as follows: Pt old BP cuff is broken and she has not picked up a new one. She is looking into getting a wrist bractlet to monitor BP. Last office visit BP reading 09/28/22 122/68 67  Recent blood glucose readings are as follows: N/A   Chart review: Recent office visits:  10/11/22 Leal-Borjas Marla CMA. Orders Only. Started on Hydroxyzine 25mg  q8h prn.   10/05/22 Leal-Borjas Marla CMA. Telephone message. Recommend decrease valium to 2 mg daily as needed x 1 month, then discontinue. Recommend add hydroxyzine 25 mg three times a day as needed for anxiety.   10/02/22 Caro Hight LPN. Orders Only. Ordered Potassium Chloride daily.   09/28/22 Blane Ohara MD. Seen for follow up. D/C Gabapentin 300mg . Changed Diazepam 5mg  from taking every 12 hours to taking daily prn.   Recent consult visits:  None  Hospital visits:  None  Medications: Outpatient Encounter Medications as of 10/12/2022  Medication Sig   hydrOXYzine (VISTARIL) 25 MG capsule Take 1 capsule (25 mg total) by mouth every 8 (eight) hours as needed.   potassium chloride (KLOR-CON M) 10 MEQ tablet Take 1 tablet (10 mEq total) by mouth daily for 30 doses.   albuterol (PROVENTIL) (2.5 MG/3ML) 0.083% nebulizer solution Take 3 mLs (2.5 mg total) by nebulization every 6 (six) hours as needed for wheezing or shortness of breath.   aspirin EC 81 MG tablet Take 1 tablet (81 mg total) by mouth daily. Swallow whole.   Budeson-Glycopyrrol-Formoterol 160-9-4.8 MCG/ACT AERO Inhale  2 puffs into the lungs 2 (two) times daily as needed (shortness of breath).   buPROPion (WELLBUTRIN XL) 150 MG 24 hr tablet TAKE 1 TABLET(150 MG) BY MOUTH DAILY   cloNIDine (CATAPRES) 0.1 MG tablet TAKE ONE TABLET BY MOUTH EVERY MORNING and TAKE ONE TABLET BY MOUTH EVERYDAY AT BEDTIME   diazepam (VALIUM) 5 MG tablet TAKE ONE  TABLET BY MOUTH daily  AS NEEDED FOR ANXIETY. watch FOR sedation   finasteride (PROPECIA) 1 MG tablet Take 1 tablet (1 mg total) by mouth daily.   ibuprofen (ADVIL) 800 MG tablet TAKE ONE TABLET BY MOUTH every EIGHT hours AS NEEDED FOR moderate pain   isosorbide mononitrate (IMDUR) 30 MG 24 hr tablet Take 0.5 tablets (15 mg total) by mouth daily.   lisinopril-hydrochlorothiazide (ZESTORETIC) 20-12.5 MG tablet Take 2 tablets by mouth every morning.   meclizine (ANTIVERT) 25 MG tablet Take 1 tablet (25 mg total) by mouth 3 (three) times daily as needed for dizziness.   nitroGLYCERIN (NITROSTAT) 0.4 MG SL tablet Place 0.4 mg under the tongue every 5 (five) minutes as needed for chest pain.   omeprazole (PRILOSEC) 40 MG capsule Take 1 capsule (40 mg total) by mouth every morning.   promethazine (PHENERGAN) 25 MG tablet TAKE ONE TABLET BY MOUTH every SIX hours AS NEEDED FOR NAUSEA AND VOMITING   rosuvastatin (CRESTOR) 10 MG tablet TAKE ONE TABLET BY MOUTH EVERYDAY AT BEDTIME   traZODone (DESYREL) 100 MG tablet Take 1 tablet (100 mg total) by mouth at bedtime.   Vitamin D, Ergocalciferol, (DRISDOL) 1.25 MG (50000 UNIT) CAPS capsule TAKE ONE CAPSULE BY MOUTH EVERY 7 DAYS   No facility-administered encounter medications on file as of 10/12/2022.   BP Readings from Last 3 Encounters:  09/28/22 122/68  09/13/22 (!) 108/58  06/26/22 124/62    Pulse Readings from Last 3 Encounters:  09/28/22 67  09/13/22 98  06/26/22 62    Lab Results  Component Value Date/Time   HGBA1C 5.2 02/14/2022 03:20 PM   HGBA1C 5.3 10/12/2021 04:57 PM   Lab Results  Component Value Date   CREATININE 0.68 09/28/2022   BUN 7 (L) 09/28/2022   GFRNONAA >60 10/15/2021   GFRAA 100 07/14/2020   NA 129 (L) 09/28/2022   K 3.4 (L) 09/28/2022   CALCIUM 9.2 09/28/2022   CO2 25 09/28/2022     Roxana Hires, CMA Clinical Pharmacist Assistant  [phone]

## 2022-10-12 NOTE — Patient Instructions (Signed)
Please call the care guide team at [phone] if you need to cancel or reschedule your appointment.   If you are experiencing a Mental Health or Behavioral Health Crisis or need someone to talk to, please call the Suicide and Crisis Lifeline: 988 call the [place] National Suicide Prevention Lifeline: 803-864-0412 or TTY: 423-814-4423 TTY 825-480-4796) to talk to a trained counselor call 1-800-273-TALK (toll free, 24 hour hotline) go to [facility] [address], [place] [phone])   Following is a copy of the CCM Program Consent:  CCM service includes personalized support from designated clinical staff supervised by the physician, including individualized plan of care and coordination with other care providers 24/7 contact phone numbers for assistance for urgent and routine care needs. Service will only be billed when office clinical staff spend 20 minutes or more in a month to coordinate care. Only one practitioner may furnish and bill the service in a calendar month. The patient may stop CCM services at amy time (effective at the end of the month) by phone call to the office staff. The patient will be responsible for cost sharing (co-pay) or up to 20% of the service fee (after annual deductible is met)  Following is a copy of your full provider care plan:   Goals Addressed             This Visit's Progress    CCM Expected Outcome:  Monitor, Self-Manage and Reduce Symptoms of: Depression       Current Barriers:  Knowledge Deficits related to effective management of pain and the impact her pain level has on her depression and stress level Care Coordination needs related to pain control and resources to help with dealing with her depression  in a patient with depression and high stress level Chronic Disease Management support and education needs related to effective management of depression  Lacks caregiver support.  Transportation  barriers Grief and loss: Husband passed in 2014 and her son passed in 2022 of lung cancer   Planned Interventions: Evaluation of current treatment plan related to depression  and patient's adherence to plan as established by provider. The patient is doing well with her depression over all. The biggest thing is the pain she lives with daily. The patient is having a good day today. The patient states that she is going to see the pain specialist again on 10-26-2022. She has seen them one time already.   Advised patient to call the office for changes in mood, anxiety, stress level, depression, or mental health needs Provided education to patient re: resources available for transportation needs, resources for effective management of pain and discomfort, resources for effective management of depression and ongoing support Reviewed medications with patient and discussed compliance. The patient states she lays her medications out each night to take in the morning. Says she forgets things sometimes but she does not forget her medications Provided patient with community resources and other resources to help in management of her chronic conditions educational materials related to effective management of depression Reviewed scheduled/upcoming provider appointments including 01-04-2023 at 220 pm Care Guide referral for transportation needs. Talked with the care guide and got numbers for transportation. She has talked to her insurance and she has so many rides that her insurance will pay for for appointments. The patient is also looking into possibly going to the pain specialist that is local.  Discussed plans with patient for ongoing care management follow up and provided patient with direct contact  information for care management team Advised patient to discuss changes in her mood, depression, or mental health needs with provider Screening for signs and symptoms of depression related to chronic disease state   Assessed social determinant of health barriers  Symptom Management: Take medications as prescribed   Attend all scheduled provider appointments Call provider office for new concerns or questions  call the Suicide and Crisis Lifeline: 988 call the [place] National Suicide Prevention Lifeline: 510-869-2082 or TTY: 647-041-6229 TTY 406-339-4259) to talk to a trained counselor call 1-800-273-TALK (toll free, 24 hour hotline) if experiencing a Mental Health or Behavioral Health Crisis   Follow Up Plan: Telephone follow up appointment with care management team member scheduled for: 12-05-2022 at 230 pm       CCM Expected Outcome:  Monitor, Self-Manage and Reduce Symptoms of: Falls prevention and Safety       Current Barriers:  Knowledge Deficits related to fall prevention and safety in a patient with multiple chronic conditions Care Coordination needs related to transportation concerns in a patient with frequent falls and chronic pain  Chronic Disease Management support and education needs related to fall prevention and safety Lacks caregiver support.  Transportation barriers  Planned Interventions: Provided  verbal education re: potential causes of falls and Fall prevention strategies Reviewed medications and discussed potential side effects of medications such as dizziness and frequent urination. The patient states she is compliant with medications. Does have dizziness at times. Review of changing position slowly and monitoring for drops in blood pressure. The patient is changing position slowly. She states she has had a couple of incidences of dizziness but takes her medications and lays down until it subsides. The patient is monitoring when she changes positions. Denies any new issues with falls and safety. Will continue to monitor.  Advised patient of importance of notifying provider of falls. Review and education provided Assessed for signs and symptoms of orthostatic hypotension. Review and  education provided Assessed for falls since last encounter. Last fall was on 05-07-2022. The patient denies any new falls. States she is being careful. Assessed patients knowledge of fall risk prevention secondary to previously provided education. Review and education provided.  Provided patient information for fall alert systems Assessed working status of life alert bracelet and patient adherence Advised patient to discuss changes in safety in her environment, new questions or concerns related to her chronic conditions and high fall risk with provider Screening for signs and symptoms of depression related to chronic disease state Assessed social determinant of health barriers  Symptom Management: Take medications as prescribed   Attend all scheduled provider appointments Call provider office for new concerns or questions  call the Suicide and Crisis Lifeline: 988 call the [place] National Suicide Prevention Lifeline: 541 751 2179 or TTY: 903 234 6703 TTY (302) 719-0599) to talk to a trained counselor call 1-800-273-TALK (toll free, 24 hour hotline) if experiencing a Mental Health or Behavioral Health Crisis  Report new falls or safety concerns to the provider  Follow Up Plan: Telephone follow up appointment with care management team member scheduled for: 12-05-2022 at 230 pm       CCM Expected Outcome:  Monitor, Self-Manage and Reduce Symptoms of: HLD       Current Barriers:  Chronic Disease Management support and education needs related to effective management of HLD Lab Results  Component Value Date   CHOL 177 06/26/2022   HDL 69 06/26/2022   LDLCALC 97 06/26/2022   TRIG 57 06/26/2022   CHOLHDL 2.6 06/26/2022  Planned Interventions: Provider established cholesterol goals reviewed. The patient is at goal; Counseled on importance of regular laboratory monitoring as prescribed. The patient has regular lab work Provided HLD Transport planner; Reviewed role and benefits of  statin for ASCVD risk reduction; Discussed strategies to manage statin-induced myalgias. The patient takes rosuvastatin 10 mg daily. The patient has chronic pain but does not have pain related to statins. Denies any new issues with medications. Has her medications. Is working with the pain specialist to help with pain control; Reviewed importance of limiting foods high in cholesterol. Review of heart healthy diet. The patient states she is eating but since having her hernia surgery she has lost weight and does not have a good appetite. She stays hydrated. Denies any issues with dietary restrictions. ; Screening for signs and symptoms of depression related to chronic disease state;  Assessed social determinant of health barriers;   Symptom Management: Take medications as prescribed   Attend all scheduled provider appointments Attend church or other social activities Perform all self care activities independently  Perform IADL's (shopping, preparing meals, housekeeping, managing finances) independently Call provider office for new concerns or questions  Work with the social worker to address care coordination needs and will continue to work with the clinical team to address health care and disease management related needs call the Suicide and Crisis Lifeline: 988 call the [place] National Suicide Prevention Lifeline: 340-754-6432 or TTY: (339)461-9171 TTY 903-806-4185) to talk to a trained counselor call 1-800-273-TALK (toll free, 24 hour hotline) if experiencing a Mental Health or Behavioral Health Crisis  - call for medicine refill 2 or 3 days before it runs out - take all medications exactly as prescribed - call doctor with any symptoms you believe are related to your medicine - call doctor when you experience any new symptoms - go to all doctor appointments as scheduled - adhere to prescribed diet: heart healthy/ADA diet   Follow Up Plan: Telephone follow up appointment with care management  team member scheduled for: 12-05-2022 at 230 pm          Patient verbalizes understanding of instructions and care plan provided today and agrees to view in MyChart. Active MyChart status and patient understanding of how to access instructions and care plan via MyChart confirmed with patient.  Telephone follow up appointment with care management team member scheduled for: 12-05-2022 at 230 pm

## 2022-10-12 NOTE — Chronic Care Management (AMB) (Signed)
Chronic Care Management   CCM RN Visit Note  10/12/2022 Name: [name] [name] MRN: [id number] DOB: 1946/02/15  Subjective: [name] [name] is a 77 y.o. year old female who is a primary care patient of Cox, Kirsten, MD. The patient was referred to the Chronic Care Management team for assistance with care management needs subsequent to provider initiation of CCM services and plan of care.    Today's Visit:  Engaged with patient by telephone for follow up visit.        Goals Addressed             This Visit's Progress    CCM Expected Outcome:  Monitor, Self-Manage and Reduce Symptoms of: Depression       Current Barriers:  Knowledge Deficits related to effective management of pain and the impact her pain level has on her depression and stress level Care Coordination needs related to pain control and resources to help with dealing with her depression  in a patient with depression and high stress level Chronic Disease Management support and education needs related to effective management of depression  Lacks caregiver support.  Transportation barriers Grief and loss: Husband passed in 2014 and her son passed in 2022 of lung cancer   Planned Interventions: Evaluation of current treatment plan related to depression  and patient's adherence to plan as established by provider. The patient is doing well with her depression over all. The biggest thing is the pain she lives with daily. The patient is having a good day today. The patient states that she is going to see the pain specialist again on 10-26-2022. She has seen them one time already.   Advised patient to call the office for changes in mood, anxiety, stress level, depression, or mental health needs Provided education to patient re: resources available for transportation needs, resources for effective management of pain and discomfort, resources for effective management of depression and ongoing support Reviewed medications with patient and  discussed compliance. The patient states she lays her medications out each night to take in the morning. Says she forgets things sometimes but she does not forget her medications Provided patient with community resources and other resources to help in management of her chronic conditions educational materials related to effective management of depression Reviewed scheduled/upcoming provider appointments including 01-04-2023 at 220 pm Care Guide referral for transportation needs. Talked with the care guide and got numbers for transportation. She has talked to her insurance and she has so many rides that her insurance will pay for for appointments. The patient is also looking into possibly going to the pain specialist that is local.  Discussed plans with patient for ongoing care management follow up and provided patient with direct contact information for care management team Advised patient to discuss changes in her mood, depression, or mental health needs with provider Screening for signs and symptoms of depression related to chronic disease state  Assessed social determinant of health barriers  Symptom Management: Take medications as prescribed   Attend all scheduled provider appointments Call provider office for new concerns or questions  call the Suicide and Crisis Lifeline: 988 call the [place] National Suicide Prevention Lifeline: 475-623-7786 or TTY: (419)557-8656 TTY 856-049-8096) to talk to a trained counselor call 1-800-273-TALK (toll free, 24 hour hotline) if experiencing a Mental Health or Behavioral Health Crisis   Follow Up Plan: Telephone follow up appointment with care management team member scheduled for: 12-05-2022 at 230 pm       CCM Expected  Outcome:  Monitor, Self-Manage and Reduce Symptoms of: Falls prevention and Safety       Current Barriers:  Knowledge Deficits related to fall prevention and safety in a patient with multiple chronic conditions Care Coordination needs related  to transportation concerns in a patient with frequent falls and chronic pain  Chronic Disease Management support and education needs related to fall prevention and safety Lacks caregiver support.  Transportation barriers  Planned Interventions: Provided  verbal education re: potential causes of falls and Fall prevention strategies Reviewed medications and discussed potential side effects of medications such as dizziness and frequent urination. The patient states she is compliant with medications. Does have dizziness at times. Review of changing position slowly and monitoring for drops in blood pressure. The patient is changing position slowly. She states she has had a couple of incidences of dizziness but takes her medications and lays down until it subsides. The patient is monitoring when she changes positions. Denies any new issues with falls and safety. Will continue to monitor.  Advised patient of importance of notifying provider of falls. Review and education provided Assessed for signs and symptoms of orthostatic hypotension. Review and education provided Assessed for falls since last encounter. Last fall was on 05-07-2022. The patient denies any new falls. States she is being careful. Assessed patients knowledge of fall risk prevention secondary to previously provided education. Review and education provided.  Provided patient information for fall alert systems Assessed working status of life alert bracelet and patient adherence Advised patient to discuss changes in safety in her environment, new questions or concerns related to her chronic conditions and high fall risk with provider Screening for signs and symptoms of depression related to chronic disease state Assessed social determinant of health barriers  Symptom Management: Take medications as prescribed   Attend all scheduled provider appointments Call provider office for new concerns or questions  call the Suicide and Crisis  Lifeline: 988 call the [place] National Suicide Prevention Lifeline: 4633656580 or TTY: 4058121628 TTY 2893350224) to talk to a trained counselor call 1-800-273-TALK (toll free, 24 hour hotline) if experiencing a Mental Health or Behavioral Health Crisis  Report new falls or safety concerns to the provider  Follow Up Plan: Telephone follow up appointment with care management team member scheduled for: 12-05-2022 at 230 pm       CCM Expected Outcome:  Monitor, Self-Manage and Reduce Symptoms of: HLD       Current Barriers:  Chronic Disease Management support and education needs related to effective management of HLD Lab Results  Component Value Date   CHOL 177 06/26/2022   HDL 69 06/26/2022   LDLCALC 97 06/26/2022   TRIG 57 06/26/2022   CHOLHDL 2.6 06/26/2022     Planned Interventions: Provider established cholesterol goals reviewed. The patient is at goal; Counseled on importance of regular laboratory monitoring as prescribed. The patient has regular lab work Provided HLD Transport planner; Reviewed role and benefits of statin for ASCVD risk reduction; Discussed strategies to manage statin-induced myalgias. The patient takes rosuvastatin 10 mg daily. The patient has chronic pain but does not have pain related to statins. Denies any new issues with medications. Has her medications. Is working with the pain specialist to help with pain control; Reviewed importance of limiting foods high in cholesterol. Review of heart healthy diet. The patient states she is eating but since having her hernia surgery she has lost weight and does not have a good appetite. She stays hydrated. Denies any issues with  dietary restrictions. ; Screening for signs and symptoms of depression related to chronic disease state;  Assessed social determinant of health barriers;   Symptom Management: Take medications as prescribed   Attend all scheduled provider appointments Attend church or other social  activities Perform all self care activities independently  Perform IADL's (shopping, preparing meals, housekeeping, managing finances) independently Call provider office for new concerns or questions  Work with the social worker to address care coordination needs and will continue to work with the clinical team to address health care and disease management related needs call the Suicide and Crisis Lifeline: 988 call the [place] National Suicide Prevention Lifeline: (920) 708-2256 or TTY: 872-438-3574 TTY (416)123-0552) to talk to a trained counselor call 1-800-273-TALK (toll free, 24 hour hotline) if experiencing a Mental Health or Behavioral Health Crisis  - call for medicine refill 2 or 3 days before it runs out - take all medications exactly as prescribed - call doctor with any symptoms you believe are related to your medicine - call doctor when you experience any new symptoms - go to all doctor appointments as scheduled - adhere to prescribed diet: heart healthy/ADA diet   Follow Up Plan: Telephone follow up appointment with care management team member scheduled for: 12-05-2022 at 230 pm          Plan:Telephone follow up appointment with care management team member scheduled for:  12-05-2022 at 230 pm  Alto Denver RN, MSN, CCM RN Care Manager  Chronic Care Management Direct Number: [phone]

## 2022-10-13 NOTE — Addendum Note (Signed)
Addended by: Zettie Pho on: 10/13/2022 03:16 PM   Modules accepted: Orders

## 2022-10-13 NOTE — Telephone Encounter (Signed)
Compliant on meds 

## 2022-10-18 ENCOUNTER — Other Ambulatory Visit: Payer: Self-pay | Admitting: Family Medicine

## 2022-10-18 DIAGNOSIS — J432 Centrilobular emphysema: Secondary | ICD-10-CM

## 2022-10-24 ENCOUNTER — Other Ambulatory Visit: Payer: Self-pay

## 2022-10-24 DIAGNOSIS — J410 Simple chronic bronchitis: Secondary | ICD-10-CM

## 2022-10-24 MED ORDER — ALBUTEROL SULFATE (2.5 MG/3ML) 0.083% IN NEBU
2.5000 mg | INHALATION_SOLUTION | Freq: Four times a day (QID) | RESPIRATORY_TRACT | 6 refills | Status: DC | PRN
Start: 2022-10-24 — End: 2023-03-07

## 2022-10-26 DIAGNOSIS — M545 Low back pain, unspecified: Secondary | ICD-10-CM | POA: Diagnosis not present

## 2022-10-26 DIAGNOSIS — M48061 Spinal stenosis, lumbar region without neurogenic claudication: Secondary | ICD-10-CM | POA: Diagnosis not present

## 2022-10-26 DIAGNOSIS — Z1389 Encounter for screening for other disorder: Secondary | ICD-10-CM | POA: Diagnosis not present

## 2022-10-26 DIAGNOSIS — M5136 Other intervertebral disc degeneration, lumbar region: Secondary | ICD-10-CM | POA: Diagnosis not present

## 2022-10-26 DIAGNOSIS — M47816 Spondylosis without myelopathy or radiculopathy, lumbar region: Secondary | ICD-10-CM | POA: Diagnosis not present

## 2022-10-27 DIAGNOSIS — F32A Depression, unspecified: Secondary | ICD-10-CM | POA: Diagnosis not present

## 2022-10-27 DIAGNOSIS — E785 Hyperlipidemia, unspecified: Secondary | ICD-10-CM | POA: Diagnosis not present

## 2022-11-13 ENCOUNTER — Other Ambulatory Visit: Payer: Self-pay | Admitting: Family Medicine

## 2022-11-13 DIAGNOSIS — L659 Nonscarring hair loss, unspecified: Secondary | ICD-10-CM

## 2022-11-20 ENCOUNTER — Other Ambulatory Visit: Payer: Self-pay | Admitting: Family Medicine

## 2022-11-20 DIAGNOSIS — K529 Noninfective gastroenteritis and colitis, unspecified: Secondary | ICD-10-CM

## 2022-11-24 DIAGNOSIS — Z1389 Encounter for screening for other disorder: Secondary | ICD-10-CM | POA: Diagnosis not present

## 2022-11-24 DIAGNOSIS — M545 Low back pain, unspecified: Secondary | ICD-10-CM | POA: Diagnosis not present

## 2022-11-24 DIAGNOSIS — M47816 Spondylosis without myelopathy or radiculopathy, lumbar region: Secondary | ICD-10-CM | POA: Diagnosis not present

## 2022-11-24 DIAGNOSIS — G894 Chronic pain syndrome: Secondary | ICD-10-CM | POA: Diagnosis not present

## 2022-11-24 DIAGNOSIS — M5136 Other intervertebral disc degeneration, lumbar region: Secondary | ICD-10-CM | POA: Diagnosis not present

## 2022-11-24 DIAGNOSIS — M48061 Spinal stenosis, lumbar region without neurogenic claudication: Secondary | ICD-10-CM | POA: Diagnosis not present

## 2022-12-05 ENCOUNTER — Telehealth: Payer: Self-pay

## 2022-12-05 ENCOUNTER — Telehealth: Payer: 59

## 2022-12-05 NOTE — Telephone Encounter (Signed)
   CCM RN Visit Note   12-05-2022 Name: [name] [name] MRN: [id number]      DOB: 06-14-1945  Subjective: [name] is a 77 y.o. year old female who is a primary care patient of Dr. Darron Doom. The patient was referred to the Chronic Care Management team for assistance with care management needs subsequent to provider initiation of CCM services and plan of care.      An unsuccessful telephone outreach was attempted today to contact the patient about Chronic Care Management needs.    Plan:The care management team will reach out to the patient again over the next 30 days.  Alto Denver RN, MSN, CCM RN Care Manager  Chronic Care Management Direct Number: [phone]

## 2022-12-10 ENCOUNTER — Other Ambulatory Visit: Payer: Self-pay | Admitting: Cardiology

## 2022-12-18 ENCOUNTER — Other Ambulatory Visit: Payer: Self-pay | Admitting: Family Medicine

## 2022-12-26 ENCOUNTER — Ambulatory Visit (INDEPENDENT_AMBULATORY_CARE_PROVIDER_SITE_OTHER): Payer: 59

## 2022-12-26 ENCOUNTER — Telehealth: Payer: 59

## 2022-12-26 DIAGNOSIS — E782 Mixed hyperlipidemia: Secondary | ICD-10-CM

## 2022-12-26 DIAGNOSIS — F4321 Adjustment disorder with depressed mood: Secondary | ICD-10-CM

## 2022-12-26 DIAGNOSIS — R296 Repeated falls: Secondary | ICD-10-CM

## 2022-12-26 DIAGNOSIS — F330 Major depressive disorder, recurrent, mild: Secondary | ICD-10-CM

## 2022-12-26 NOTE — Patient Instructions (Signed)
Please call the care guide team at [phone] if you need to cancel or reschedule your appointment.   If you are experiencing a Mental Health or Behavioral Health Crisis or need someone to talk to, please call the Suicide and Crisis Lifeline: 988 call the [place] National Suicide Prevention Lifeline: (575)773-3655 or TTY: 817-801-0923 TTY 323-556-6437) to talk to a trained counselor call 1-800-273-TALK (toll free, 24 hour hotline) go to [facility] [address], [place] [phone])   Following is a copy of the CCM Program Consent:  CCM service includes personalized support from designated clinical staff supervised by the physician, including individualized plan of care and coordination with other care providers 24/7 contact phone numbers for assistance for urgent and routine care needs. Service will only be billed when office clinical staff spend 20 minutes or more in a month to coordinate care. Only one practitioner may furnish and bill the service in a calendar month. The patient may stop CCM services at amy time (effective at the end of the month) by phone call to the office staff. The patient will be responsible for cost sharing (co-pay) or up to 20% of the service fee (after annual deductible is met)  Following is a copy of your full provider care plan:   Goals Addressed             This Visit's Progress    CCM Expected Outcome:  Monitor, Self-Manage and Reduce Symptoms of: Depression       Current Barriers:  Knowledge Deficits related to effective management of pain and the impact her pain level has on her depression and stress level Care Coordination needs related to pain control and resources to help with dealing with her depression  in a patient with depression and high stress level Chronic Disease Management support and education needs related to effective management of depression  Lacks caregiver support.  Transportation  barriers Grief and loss: Husband passed in 2014 and her son passed in 2022 of lung cancer   Planned Interventions: Evaluation of current treatment plan related to depression  and patient's adherence to plan as established by provider. The patient is doing well with her depression over all. The biggest thing is the pain she lives with daily. The patient is not feeling the best today. She feels if she could get her pain under control she would feel better. She is going to see her pain specialist on Thursday. Reminder given to write down questions to ask the providers at her upcoming appointments with the pcp and the pain specialist.  Advised patient to call the office for changes in mood, anxiety, stress level, depression, or mental health needs Provided education to patient re: resources available for transportation needs, resources for effective management of pain and discomfort, resources for effective management of depression and ongoing support Reviewed medications with patient and discussed compliance. The patient states she lays her medications out each night to take in the morning. Says she forgets things sometimes but she does not forget her medications. The patient wants to get her valium back and feels like this would help her. She will discuss with the specialist on Thursday. Review of making sure she discussed with the specialist due to guidelines of the pain regimen.  Provided patient with community resources and other resources to help in management of her chronic conditions educational materials related to effective management of depression Reviewed scheduled/upcoming provider appointments including 01-04-2023 at 220 pm Care Guide referral for transportation needs.  Talked with the care guide and got numbers for transportation. She has talked to her insurance and she has so many rides that her insurance will pay for for appointments. The patient sees the pain specialist again on 12-28-2022.   Discussed plans with patient for ongoing care management follow up and provided patient with direct contact information for care management team Advised patient to discuss changes in her mood, depression, or mental health needs with provider Screening for signs and symptoms of depression related to chronic disease state  Assessed social determinant of health barriers  Symptom Management: Take medications as prescribed   Attend all scheduled provider appointments Call provider office for new concerns or questions  call the Suicide and Crisis Lifeline: 988 call the [place] National Suicide Prevention Lifeline: 7085593209 or TTY: 203-534-3363 TTY 320-139-8121) to talk to a trained counselor call 1-800-273-TALK (toll free, 24 hour hotline) if experiencing a Mental Health or Behavioral Health Crisis   Follow Up Plan: Telephone follow up appointment with care management team member scheduled for: 01-23-2023 at 230 pm       RNCM Care Management  Expected Outcome:  Monitor, Self-Manage and Reduce Symptoms of: Falls prevention and Safety       Current Barriers:  Knowledge Deficits related to fall prevention and safety in a patient with multiple chronic conditions Care Coordination needs related to transportation concerns in a patient with frequent falls and chronic pain  Chronic Disease Management support and education needs related to fall prevention and safety Lacks caregiver support.  Transportation barriers  Planned Interventions: Provided  verbal education re: potential causes of falls and Fall prevention strategies. The patient states she is being careful. Denies any new falls Reviewed medications and discussed potential side effects of medications such as dizziness and frequent urination. The patient states she is compliant with medications. Does have dizziness at times. Review of changing position slowly and monitoring for drops in blood pressure. The patient is changing position slowly.  She states she has had a couple of incidences of dizziness but takes her medications and lays down until it subsides. The patient is monitoring when she changes positions. Denies any new issues with falls and safety. Will continue to monitor.  Advised patient of importance of notifying provider of falls. Review and education provided Assessed for signs and symptoms of orthostatic hypotension. Review and education provided Assessed for falls since last encounter. Last fall was on 05-07-2022. The patient denies any new falls. States she is being careful. Assessed patients knowledge of fall risk prevention secondary to previously provided education. Review and education provided.  Provided patient information for fall alert systems Assessed working status of life alert bracelet and patient adherence Advised patient to discuss changes in safety in her environment, new questions or concerns related to her chronic conditions and high fall risk with provider Screening for signs and symptoms of depression related to chronic disease state Assessed social determinant of health barriers  Symptom Management: Take medications as prescribed   Attend all scheduled provider appointments Call provider office for new concerns or questions  call the Suicide and Crisis Lifeline: 988 call the [place] National Suicide Prevention Lifeline: (989)306-0222 or TTY: 4428268789 TTY (575)397-7458) to talk to a trained counselor call 1-800-273-TALK (toll free, 24 hour hotline) if experiencing a Mental Health or Behavioral Health Crisis  Report new falls or safety concerns to the provider  Follow Up Plan: Telephone follow up appointment with care management team member scheduled for: 01-23-2023 at 230 pm  RNCM Care Management  Expected Outcome:  Monitor, Self-Manage and Reduce Symptoms of: HLD       Current Barriers:  Chronic Disease Management support and education needs related to effective management of HLD Lab  Results  Component Value Date   CHOL 177 06/26/2022   HDL 69 06/26/2022   LDLCALC 97 06/26/2022   TRIG 57 06/26/2022   CHOLHDL 2.6 06/26/2022     Planned Interventions: Provider established cholesterol goals reviewed. The patient is at goal; Counseled on importance of regular laboratory monitoring as prescribed. The patient has regular lab work Provided HLD Transport planner; Reviewed role and benefits of statin for ASCVD risk reduction. The patient is compliant with her Rosuvastatin 10 mg as directed; Discussed strategies to manage statin-induced myalgias. The patient takes rosuvastatin 10 mg daily. The patient has chronic pain but does not have pain related to statins. Denies any new issues with medications. Has her medications. Is working with the pain specialist to help with pain control; Reviewed importance of limiting foods high in cholesterol. Review of heart healthy diet. The patient states she is eating but since having her hernia surgery she has lost weight and does not have a good appetite. She stays hydrated. Denies any issues with dietary restrictions. ; Screening for signs and symptoms of depression related to chronic disease state;  Assessed social determinant of health barriers;   Symptom Management: Take medications as prescribed   Attend all scheduled provider appointments Attend church or other social activities Perform all self care activities independently  Perform IADL's (shopping, preparing meals, housekeeping, managing finances) independently Call provider office for new concerns or questions  Work with the social worker to address care coordination needs and will continue to work with the clinical team to address health care and disease management related needs call the Suicide and Crisis Lifeline: 988 call the [place] National Suicide Prevention Lifeline: (628)316-2376 or TTY: (914) 759-5195 TTY 305-173-4115) to talk to a trained counselor call 1-800-273-TALK  (toll free, 24 hour hotline) if experiencing a Mental Health or Behavioral Health Crisis  - call for medicine refill 2 or 3 days before it runs out - take all medications exactly as prescribed - call doctor with any symptoms you believe are related to your medicine - call doctor when you experience any new symptoms - go to all doctor appointments as scheduled - adhere to prescribed diet: heart healthy/ADA diet   Follow Up Plan: Telephone follow up appointment with care management team member scheduled for: 01-23-2023 at 230 pm          Patient verbalizes understanding of instructions and care plan provided today and agrees to view in MyChart. Active MyChart status and patient understanding of how to access instructions and care plan via MyChart confirmed with patient.  Telephone follow up appointment with care management team member scheduled for: 01-23-2023 at 230 pm

## 2022-12-26 NOTE — Chronic Care Management (AMB) (Signed)
Chronic Care Management   CCM RN Visit Note  12/26/2022 Name: [name] [name] MRN: [id number] DOB: 1946/04/07  Subjective: [name] [name] is a 77 y.o. year old female who is a primary care patient of Cox, Kirsten, MD. The patient was referred to the Chronic Care Management team for assistance with care management needs subsequent to provider initiation of CCM services and plan of care.    Today's Visit:  Engaged with patient by telephone for follow up visit.     SDOH Interventions Today    Flowsheet Row Most Recent Value  SDOH Interventions   Health Literacy Interventions Intervention Not Indicated         Goals Addressed             This Visit's Progress    CCM Expected Outcome:  Monitor, Self-Manage and Reduce Symptoms of: Depression       Current Barriers:  Knowledge Deficits related to effective management of pain and the impact her pain level has on her depression and stress level Care Coordination needs related to pain control and resources to help with dealing with her depression  in a patient with depression and high stress level Chronic Disease Management support and education needs related to effective management of depression  Lacks caregiver support.  Transportation barriers Grief and loss: Husband passed in 2014 and her son passed in 2022 of lung cancer   Planned Interventions: Evaluation of current treatment plan related to depression  and patient's adherence to plan as established by provider. The patient is doing well with her depression over all. The biggest thing is the pain she lives with daily. The patient is not feeling the best today. She feels if she could get her pain under control she would feel better. She is going to see her pain specialist on Thursday. Reminder given to write down questions to ask the providers at her upcoming appointments with the pcp and the pain specialist.  Advised patient to call the office for changes in mood, anxiety, stress level,  depression, or mental health needs Provided education to patient re: resources available for transportation needs, resources for effective management of pain and discomfort, resources for effective management of depression and ongoing support Reviewed medications with patient and discussed compliance. The patient states she lays her medications out each night to take in the morning. Says she forgets things sometimes but she does not forget her medications. The patient wants to get her valium back and feels like this would help her. She will discuss with the specialist on Thursday. Review of making sure she discussed with the specialist due to guidelines of the pain regimen.  Provided patient with community resources and other resources to help in management of her chronic conditions educational materials related to effective management of depression Reviewed scheduled/upcoming provider appointments including 01-04-2023 at 220 pm Care Guide referral for transportation needs. Talked with the care guide and got numbers for transportation. She has talked to her insurance and she has so many rides that her insurance will pay for for appointments. The patient sees the pain specialist again on 12-28-2022.  Discussed plans with patient for ongoing care management follow up and provided patient with direct contact information for care management team Advised patient to discuss changes in her mood, depression, or mental health needs with provider Screening for signs and symptoms of depression related to chronic disease state  Assessed social determinant of health barriers  Symptom Management: Take medications as prescribed   Attend  all scheduled provider appointments Call provider office for new concerns or questions  call the Suicide and Crisis Lifeline: 988 call the [place] National Suicide Prevention Lifeline: (986)441-2445 or TTY: (754)635-5902 TTY (858)040-3551) to talk to a trained counselor call  1-800-273-TALK (toll free, 24 hour hotline) if experiencing a Mental Health or Behavioral Health Crisis   Follow Up Plan: Telephone follow up appointment with care management team member scheduled for: 01-23-2023 at 230 pm       RNCM Care Management  Expected Outcome:  Monitor, Self-Manage and Reduce Symptoms of: Falls prevention and Safety       Current Barriers:  Knowledge Deficits related to fall prevention and safety in a patient with multiple chronic conditions Care Coordination needs related to transportation concerns in a patient with frequent falls and chronic pain  Chronic Disease Management support and education needs related to fall prevention and safety Lacks caregiver support.  Transportation barriers  Planned Interventions: Provided  verbal education re: potential causes of falls and Fall prevention strategies. The patient states she is being careful. Denies any new falls Reviewed medications and discussed potential side effects of medications such as dizziness and frequent urination. The patient states she is compliant with medications. Does have dizziness at times. Review of changing position slowly and monitoring for drops in blood pressure. The patient is changing position slowly. She states she has had a couple of incidences of dizziness but takes her medications and lays down until it subsides. The patient is monitoring when she changes positions. Denies any new issues with falls and safety. Will continue to monitor.  Advised patient of importance of notifying provider of falls. Review and education provided Assessed for signs and symptoms of orthostatic hypotension. Review and education provided Assessed for falls since last encounter. Last fall was on 05-07-2022. The patient denies any new falls. States she is being careful. Assessed patients knowledge of fall risk prevention secondary to previously provided education. Review and education provided.  Provided patient  information for fall alert systems Assessed working status of life alert bracelet and patient adherence Advised patient to discuss changes in safety in her environment, new questions or concerns related to her chronic conditions and high fall risk with provider Screening for signs and symptoms of depression related to chronic disease state Assessed social determinant of health barriers  Symptom Management: Take medications as prescribed   Attend all scheduled provider appointments Call provider office for new concerns or questions  call the Suicide and Crisis Lifeline: 988 call the [place] National Suicide Prevention Lifeline: (340) 692-4553 or TTY: 2791673644 TTY 801-159-1971) to talk to a trained counselor call 1-800-273-TALK (toll free, 24 hour hotline) if experiencing a Mental Health or Behavioral Health Crisis  Report new falls or safety concerns to the provider  Follow Up Plan: Telephone follow up appointment with care management team member scheduled for: 01-23-2023 at 230 pm       RNCM Care Management  Expected Outcome:  Monitor, Self-Manage and Reduce Symptoms of: HLD       Current Barriers:  Chronic Disease Management support and education needs related to effective management of HLD Lab Results  Component Value Date   CHOL 177 06/26/2022   HDL 69 06/26/2022   LDLCALC 97 06/26/2022   TRIG 57 06/26/2022   CHOLHDL 2.6 06/26/2022     Planned Interventions: Provider established cholesterol goals reviewed. The patient is at goal; Counseled on importance of regular laboratory monitoring as prescribed. The patient has regular lab work Provided HLD educational  materials; Reviewed role and benefits of statin for ASCVD risk reduction. The patient is compliant with her Rosuvastatin 10 mg as directed; Discussed strategies to manage statin-induced myalgias. The patient takes rosuvastatin 10 mg daily. The patient has chronic pain but does not have pain related to statins. Denies any new  issues with medications. Has her medications. Is working with the pain specialist to help with pain control; Reviewed importance of limiting foods high in cholesterol. Review of heart healthy diet. The patient states she is eating but since having her hernia surgery she has lost weight and does not have a good appetite. She stays hydrated. Denies any issues with dietary restrictions. ; Screening for signs and symptoms of depression related to chronic disease state;  Assessed social determinant of health barriers;   Symptom Management: Take medications as prescribed   Attend all scheduled provider appointments Attend church or other social activities Perform all self care activities independently  Perform IADL's (shopping, preparing meals, housekeeping, managing finances) independently Call provider office for new concerns or questions  Work with the social worker to address care coordination needs and will continue to work with the clinical team to address health care and disease management related needs call the Suicide and Crisis Lifeline: 988 call the [place] National Suicide Prevention Lifeline: 847-072-7345 or TTY: (972) 154-0521 TTY (865) 571-2339) to talk to a trained counselor call 1-800-273-TALK (toll free, 24 hour hotline) if experiencing a Mental Health or Behavioral Health Crisis  - call for medicine refill 2 or 3 days before it runs out - take all medications exactly as prescribed - call doctor with any symptoms you believe are related to your medicine - call doctor when you experience any new symptoms - go to all doctor appointments as scheduled - adhere to prescribed diet: heart healthy/ADA diet   Follow Up Plan: Telephone follow up appointment with care management team member scheduled for: 01-23-2023 at 230 pm          Plan:Telephone follow up appointment with care management team member scheduled for:  01-23-2023 at 230 pm  Alto Denver RN, MSN, CCM RN Care Manager  Chronic  Care Management Direct Number: [phone]

## 2022-12-27 DIAGNOSIS — F32A Depression, unspecified: Secondary | ICD-10-CM | POA: Diagnosis not present

## 2022-12-27 DIAGNOSIS — E785 Hyperlipidemia, unspecified: Secondary | ICD-10-CM

## 2022-12-28 DIAGNOSIS — M545 Low back pain, unspecified: Secondary | ICD-10-CM | POA: Diagnosis not present

## 2022-12-28 DIAGNOSIS — M48061 Spinal stenosis, lumbar region without neurogenic claudication: Secondary | ICD-10-CM | POA: Diagnosis not present

## 2022-12-28 DIAGNOSIS — M5136 Other intervertebral disc degeneration, lumbar region: Secondary | ICD-10-CM | POA: Diagnosis not present

## 2022-12-28 DIAGNOSIS — G894 Chronic pain syndrome: Secondary | ICD-10-CM | POA: Diagnosis not present

## 2022-12-28 DIAGNOSIS — M47816 Spondylosis without myelopathy or radiculopathy, lumbar region: Secondary | ICD-10-CM | POA: Diagnosis not present

## 2023-01-04 ENCOUNTER — Ambulatory Visit: Payer: 59

## 2023-01-04 ENCOUNTER — Telehealth: Payer: 59 | Admitting: Family Medicine

## 2023-01-08 ENCOUNTER — Ambulatory Visit (INDEPENDENT_AMBULATORY_CARE_PROVIDER_SITE_OTHER): Payer: 59

## 2023-01-08 VITALS — BP 90/50 | HR 59 | Temp 97.2°F | Resp 14 | Ht 62.0 in | Wt 127.0 lb

## 2023-01-08 DIAGNOSIS — M542 Cervicalgia: Secondary | ICD-10-CM

## 2023-01-08 DIAGNOSIS — I152 Hypertension secondary to endocrine disorders: Secondary | ICD-10-CM

## 2023-01-08 DIAGNOSIS — E559 Vitamin D deficiency, unspecified: Secondary | ICD-10-CM

## 2023-01-08 DIAGNOSIS — I700 Atherosclerosis of aorta: Secondary | ICD-10-CM

## 2023-01-08 DIAGNOSIS — J449 Chronic obstructive pulmonary disease, unspecified: Secondary | ICD-10-CM | POA: Diagnosis not present

## 2023-01-08 DIAGNOSIS — G8929 Other chronic pain: Secondary | ICD-10-CM | POA: Insufficient documentation

## 2023-01-08 DIAGNOSIS — E782 Mixed hyperlipidemia: Secondary | ICD-10-CM

## 2023-01-08 DIAGNOSIS — G894 Chronic pain syndrome: Secondary | ICD-10-CM | POA: Diagnosis not present

## 2023-01-08 DIAGNOSIS — E1159 Type 2 diabetes mellitus with other circulatory complications: Secondary | ICD-10-CM | POA: Diagnosis not present

## 2023-01-08 HISTORY — DX: Vitamin D deficiency, unspecified: E55.9

## 2023-01-08 HISTORY — DX: Other chronic pain: G89.29

## 2023-01-08 NOTE — Progress Notes (Signed)
Subjective:  Patient ID: [name] [name], female    DOB: 1945/06/23  Age: 77 y.o. MRN: [id number]  Chief Complaint  Patient presents with   Medical Management of Chronic Issues    HPI   Hyperlipidemia.   Medications: Rosuvastatin 10 mg daily without problems.    Hypertension.   Medications: Lisinopril-Hctz 20-12.5 mg 2 tablets every morning, Clonidine 0.1 twice a day , aspirin 81 mg daily well without side effects.   Does not check home Bps. Denies dizziness.   COPD: Breztri 2 puffs twice daily, Albuterol nebulizer  2.5 mg every 6 hours as needed , albuterol inhaler 2 puffs twice a day. Tobacco 1 ppd. Uses albuterol about once daily.     GERD: Omeprazole 40 mg daily still gets GERD and uses every day.    CORONARY ARTERY DISEASE: non obstructive CORONARY ARTERY DISEASE. Patient sees Dr. Tomie China.  On rosuvastatin 10 mg before bed and Baby asprin. .    Depression/GAD: Wellbutrin xl 150 mg one in am. Hydroxyzine 25 mg every 8 hours as needed. Takes diazepam 5 mg once daily for panic attacks.  Trazodone 100 mg before bed for insomnia.    Chronic pain syndrome:  Mid thoracic back pain and radiates to lumbar region and then goes around to the sides.  She is seeing in the pain clinic and they sent oxycodone-acetaminophen 7.5-325 mg every 6 hours. Next appt on 01/25/23.  Reports chronic neck pain. Pain clinic only sees her for her back pain. Turning her head to left hurts bad. Does not feel she needs PT  WAS TOLD SHE WAS BORDERLINE DIABETIC years ago.     08/01/2022    3:48 PM 06/26/2022   11:09 AM 02/14/2022    2:55 PM 11/03/2021   11:29 AM 08/11/2021   11:14 AM  Depression screen PHQ 2/9  Decreased Interest 0 0 1 1 1   Down, Depressed, Hopeless 1 1 2 1 3   PHQ - 2 Score 1 1 3 2 4   Altered sleeping 3 3 2 3 3   Tired, decreased energy 3 3 3 3 3   Change in appetite 2 2 3 3 3   Feeling bad or failure about yourself  0 0 2 0 3  Trouble concentrating 2 2 2 3 3   Moving slowly or  fidgety/restless 1 1 2 1 3   Suicidal thoughts 0 0 0 0 0  PHQ-9 Score 12 12 17 15 22   Difficult doing work/chores Not difficult at all Not difficult at all Somewhat difficult Not difficult at all Very difficult        10/12/2022    3:02 PM  Fall Risk   Falls in the past year? 1  Number falls in past yr: 0  Injury with Fall? 1  Risk for fall due to : History of fall(s);Impaired balance/gait;Impaired mobility;Orthopedic patient;Other (Comment)  Risk for fall due to: Comment Chronic back pain and discomfort  Follow up Falls evaluation completed;Education provided;Falls prevention discussed;Follow up appointment    Patient Care Team: Blane Ohara, MD as PCP - General (Internal Medicine) Thomasene Ripple, DO as PCP - Cardiology (Cardiology) Marlowe Sax, RN as Case Manager (General Practice)   Review of Systems  Constitutional:  Negative for chills, fatigue and fever.  HENT:  Negative for congestion, ear pain and sore throat.   Respiratory:  Negative for cough and shortness of breath.   Cardiovascular:  Negative for chest pain and palpitations.  Gastrointestinal:  Negative for abdominal pain, constipation, diarrhea, nausea and vomiting.  Endocrine: Negative for polydipsia, polyphagia and polyuria.  Genitourinary:  Negative for difficulty urinating and dysuria.  Musculoskeletal:  Positive for arthralgias, back pain and neck pain. Negative for myalgias.  Skin:  Negative for rash.  Neurological:  Negative for headaches.  Psychiatric/Behavioral:  Negative for dysphoric mood. The patient is not nervous/anxious.     Current Outpatient Medications on File Prior to Visit  Medication Sig Dispense Refill   albuterol (PROVENTIL) (2.5 MG/3ML) 0.083% nebulizer solution Take 3 mLs (2.5 mg total) by nebulization every 6 (six) hours as needed for wheezing or shortness of breath. 120 mL 6   aspirin EC 81 MG tablet Take 1 tablet (81 mg total) by mouth daily. Swallow whole. 90 tablet 3    Budeson-Glycopyrrol-Formoterol 160-9-4.8 MCG/ACT AERO Inhale 2 puffs into the lungs 2 (two) times daily as needed (shortness of breath). 10.7 g 2   buPROPion (WELLBUTRIN XL) 150 MG 24 hr tablet TAKE 1 TABLET(150 MG) BY MOUTH DAILY 90 tablet 2   cloNIDine (CATAPRES) 0.1 MG tablet TAKE ONE TABLET BY MOUTH EVERY MORNING and TAKE ONE TABLET BY MOUTH EVERYDAY AT BEDTIME 180 tablet 3   finasteride (PROPECIA) 1 MG tablet TAKE ONE TABLET BY MOUTH DAILY 30 tablet 3   hydrOXYzine (VISTARIL) 25 MG capsule Take 1 capsule (25 mg total) by mouth every 8 (eight) hours as needed. 30 capsule 0   ibuprofen (ADVIL) 800 MG tablet TAKE ONE TABLET BY MOUTH every EIGHT hours AS NEEDED FOR moderate pain 90 tablet 2   isosorbide mononitrate (IMDUR) 30 MG 24 hr tablet Take 0.5 tablets (15 mg total) by mouth daily. 15 tablet 1   lisinopril-hydrochlorothiazide (ZESTORETIC) 20-12.5 MG tablet Take 2 tablets by mouth every morning. 180 tablet 1   meclizine (ANTIVERT) 25 MG tablet Take 1 tablet (25 mg total) by mouth 3 (three) times daily as needed for dizziness. 180 tablet 1   nitroGLYCERIN (NITROSTAT) 0.4 MG SL tablet Place 0.4 mg under the tongue every 5 (five) minutes as needed for chest pain.     omeprazole (PRILOSEC) 40 MG capsule Take 1 capsule (40 mg total) by mouth every morning. 90 capsule 1   oxyCODONE-acetaminophen (PERCOCET) 7.5-325 MG tablet Take 1 tablet by mouth every 6 (six) hours as needed.     potassium chloride (KLOR-CON M) 10 MEQ tablet TAKE ONE TABLET BY MOUTH ONCE daily 30 tablet 3   promethazine (PHENERGAN) 25 MG tablet TAKE ONE TABLET BY MOUTH every SIX hours AS NEEDED FOR NAUSEA AND VOMITING 30 tablet 3   rosuvastatin (CRESTOR) 10 MG tablet TAKE ONE TABLET BY MOUTH EVERYDAY AT BEDTIME 90 tablet 1   traZODone (DESYREL) 100 MG tablet Take 1 tablet (100 mg total) by mouth at bedtime. 90 tablet 1   Vitamin D, Ergocalciferol, (DRISDOL) 1.25 MG (50000 UNIT) CAPS capsule TAKE ONE CAPSULE BY MOUTH ONCE WEEKLY ON  MONDAY 12 capsule 2   No current facility-administered medications on file prior to visit.   Past Medical History:  Diagnosis Date   Anxiety    Aortic atherosclerosis (HCC)    Ascending aorta dilation (HCC)    a.) TTE 09/02/19 --> mild; measured 39mm.   Asthma    BMI 24.0-24.9, adult 02/14/2022   CAD (coronary artery disease)    a,) CTA 08/24/20 --> mild CAD (25-49%) in RCA, LAD, LCx; CAC/Agatston score 509.   Cataract    Chronic left shoulder pain 09/27/2015   COPD (chronic obstructive pulmonary disease) (HCC)    Coronary artery disease involving native coronary  artery of native heart without angina pectoris 11/18/2020   Degenerative joint disease involving multiple joints 09/28/2017   Depression    Depression, major, single episode, severe (HCC) 03/30/2020   Diastolic dysfunction    a.) TTE 09/02/19 --> LVEF 60-65%; G2DD. b.) TTE 07/23/20 --> LVEF 70-75%; G1DD.   Enlarged aorta (HCC)    Essential hypertension 08/07/2019   GERD (gastroesophageal reflux disease)    GERD with esophagitis 08/07/2019   History of kidney stones    Hyperlipidemia    Hypertension    Long-term current use of opiate analgesic 06/25/2017   Lupus (HCC)    Myocardial infarction (HCC)    Osteoarthritis    PAC (premature atrial contraction)    a.) Holter 12/04/19 --> occassional; 1.1% PAC burden.   Pain of right hip joint 11/22/2015   Paraesophageal hernia    PAT (paroxysmal atrial tachycardia) 02/03/2020   Presbyesophagus    Primary osteoarthritis of left knee 02/04/2015   PSVT (paroxysmal supraventricular tachycardia)    a.) Holter 12/04/19 --> 22 runs.   Renal calculus 12/21/2021   S/P repair of paraesophageal hernia 02/22/2021   S/P total knee arthroplasty 02/15/2015   Schatzki's ring    Sciatica 01/31/2022   Tobacco use 08/19/2019   Type 2 diabetes mellitus with other specified complication (HCC) 08/07/2019   Past Surgical History:  Procedure Laterality Date   ABDOMINAL HYSTERECTOMY     BLADDER  SUSPENSION     BREAST BIOPSY Right    COLONOSCOPY  09/04/2011   Colonic polyps, status post polyectomy. Incidental small ascending colon lipoma   ESOPHAGOGASTRODUODENOSCOPY  09/08/2015   Schatzki ring status post esophageal dilitation. Small hiatal hernia   hemorrhoid surgery     KNEE SURGERY     left   TOTAL KNEE ARTHROPLASTY Left 02/15/2015   Procedure: TOTAL KNEE ARTHROPLASTY;  Surgeon: Dannielle Huh, MD;  Location: [facility] OR;  Service: Orthopedics;  Laterality: Left;   TUBAL LIGATION     WRIST SURGERY     right   XI ROBOTIC ASSISTED PARAESOPHAGEAL HERNIA REPAIR N/A 02/22/2021   Procedure: XI ROBOTIC ASSISTED PARAESOPHAGEAL HERNIA REPAIR with RNFA to assist;  Surgeon: Leafy Ro, MD;  Location: [facility] ORS;  Service: General;  Laterality: N/A;    Family History  Problem Relation Age of Onset   Stroke Mother    Heart disease Mother    COPD Father    Cancer Father        Bone   Diabetes Father    Heart disease Father    Stroke Father    Lupus Sister    Seizures Son    COPD Son    Colon cancer Neg Hx    Esophageal cancer Neg Hx    Stomach cancer Neg Hx    Rectal cancer Neg Hx    Social History   Socioeconomic History   Marital status: Widowed    Spouse name: Not on file   Number of children: 4   Years of education: Not on file   Highest education level: Not on file  Occupational History   Occupation: Retired  Tobacco Use   Smoking status: Every Day    Current packs/day: 1.00    Average packs/day: 1 pack/day for 48.0 years (48.0 ttl pk-yrs)    Types: Cigarettes   Smokeless tobacco: Never  Vaping Use   Vaping status: Never Used  Substance and Sexual Activity   Alcohol use: No   Drug use: No   Sexual activity: Not Currently  Other  Topics Concern   Not on file  Social History Narrative   Not on file   Social Determinants of Health   Financial Resource Strain: Low Risk  (05/11/2022)   Overall Financial Resource Strain (CARDIA)    Difficulty of Paying Living  Expenses: Not hard at all  Food Insecurity: No Food Insecurity (08/01/2022)   Hunger Vital Sign    Worried About Running Out of Food in the Last Year: Never true    Ran Out of Food in the Last Year: Never true  Transportation Needs: Unmet Transportation Needs (08/01/2022)   PRAPARE - Administrator, Civil Service (Medical): Yes    Lack of Transportation (Non-Medical): Yes  Physical Activity: Inactive (08/01/2022)   Exercise Vital Sign    Days of Exercise per Week: 0 days    Minutes of Exercise per Session: 0 min  Stress: Stress Concern Present (08/01/2022)   [employer] of Occupational Health - Occupational Stress Questionnaire    Feeling of Stress : To some extent  Social Connections: Socially Isolated (05/11/2022)   Social Connection and Isolation Panel [NHANES]    Frequency of Communication with Friends and Family: More than three times a week    Frequency of Social Gatherings with Friends and Family: Twice a week    Attends Religious Services: Never    Database administrator or Organizations: No    Attends Banker Meetings: Never    Marital Status: Widowed    Objective:  BP (!) 90/50   Pulse (!) 59   Temp (!) 97.2 F (36.2 C)   Resp 14   Ht 5\' 2"  (1.575 m)   Wt 127 lb (57.6 kg)   SpO2 92%   BMI 23.23 kg/m      01/08/2023   11:22 AM 09/28/2022    2:27 PM 09/13/2022    1:59 PM  BP/Weight  Systolic BP 90 122 108  Diastolic BP 50 68 58  Wt. (Lbs) 127 132 133  BMI 23.23 kg/m2 24.14 kg/m2 24.33 kg/m2    Physical Exam Constitutional:      Appearance: Normal appearance.  HENT:     Head: Normocephalic and atraumatic.     Mouth/Throat:     Comments: Dentures both jaws Neck:     Comments: Restricted ROM Left trapezius tenderness Mild cervical spinal tenderness Cardiovascular:     Rate and Rhythm: Normal rate and regular rhythm.  Pulmonary:     Comments: Occasional wheeze noted  Musculoskeletal:     Comments: Restricted ROM low back . Walks  with antalgic gait and a cane  Skin:    General: Skin is dry.  Neurological:     General: No focal deficit present.     Mental Status: She is alert and oriented to person, place, and time.     Diabetic Foot Exam - Simple   No data filed      Lab Results  Component Value Date   WBC 5.1 09/13/2022   HGB 13.4 09/13/2022   HCT 37.9 09/13/2022   PLT 208 09/13/2022   GLUCOSE 86 09/28/2022   CHOL 177 06/26/2022   TRIG 57 06/26/2022   HDL 69 06/26/2022   LDLCALC 97 06/26/2022   ALT 14 09/28/2022   AST 15 09/28/2022   NA 129 (L) 09/28/2022   K 3.4 (L) 09/28/2022   CL 90 (L) 09/28/2022   CREATININE 0.68 09/28/2022   BUN 7 (L) 09/28/2022   CO2 25 09/28/2022   TSH 1.330 09/13/2022  INR 1.02 01/28/2015   HGBA1C 5.2 02/14/2022      Assessment & Plan:    benign essential hypertension Assessment & Plan: Low BP today Denies dizziness. Advised to purchase a home BP monitor, check her home Bps from time to time and report back if very low, to make medication adjustments.  She is   Orders: -     CBC with Differential/Platelet -     Comprehensive metabolic panel  Aortic atherosclerosis (HCC) Assessment & Plan: Stable, on Crestor 10 mg daily Continues to smoke though, does not want to quit   Chronic pain syndrome Assessment & Plan: Has chronic pain, follows up with the pain clinic.   Takes ibuprofen, reportedly on Percocet through the pain clinic, has a follow-up appointment with them on January 25, 2023.     Mixed hyperlipidemia Assessment & Plan: Well managed with Crestor 10 mg daily Will order repeat lipid panel Recommend to continue low calorie diet  Orders: -     Lipid panel  Vitamin D deficiency Assessment & Plan: Known vitamin D deficiency, has had ergocalciferol weekly tablets for few weeks Will recheck vitamin D level  Orders: -     VITAMIN D 25 Hydroxy (Vit-D Deficiency, Fractures)  Chronic neck pain Assessment & Plan: Reports chronic neck  pain. States the Percocet does not seem to help with the neck pain. No prior imaging reported On exam, she has some tenderness of her cervical spine and some left trapezius tenderness Range of motion at the neck seems to be slightly limited  Plan: Will order x-ray of cervical spine to further evaluate this. Does not wish to try physical therapy at this time      No orders of the defined types were placed in this encounter.   Orders Placed This Encounter  Procedures   CBC with Differential/Platelet   Comprehensive metabolic panel   Lipid panel   VITAMIN D 25 Hydroxy (Vit-D Deficiency, Fractures)     Follow-up: No follow-ups on file.   I,Marla I Leal-Borjas,acting as a scribe for [employer], MD.,have documented all relevant documentation on the behalf of Windell Moment, MD,as directed by  Windell Moment, MD while in the presence of Windell Moment, MD.   An After Visit Summary was printed and given to the patient.  Windell Moment, MD [facility] [phone]

## 2023-01-08 NOTE — Assessment & Plan Note (Addendum)
Low BP today Denies dizziness. Advised to purchase a home BP monitor, check her home Bps from time to time and report back if very low, to make medication adjustments.  She is

## 2023-01-08 NOTE — Assessment & Plan Note (Signed)
Well managed with Crestor 10 mg daily Will order repeat lipid panel Recommend to continue low calorie diet

## 2023-01-08 NOTE — Assessment & Plan Note (Signed)
Known vitamin D deficiency, has had ergocalciferol weekly tablets for few weeks Will recheck vitamin D level

## 2023-01-08 NOTE — Assessment & Plan Note (Signed)
Has chronic pain, follows up with the pain clinic.   Takes ibuprofen, reportedly on Percocet through the pain clinic, has a follow-up appointment with them on January 25, 2023.

## 2023-01-08 NOTE — Assessment & Plan Note (Signed)
Stable, on Crestor 10 mg daily Continues to smoke though, does not want to quit

## 2023-01-08 NOTE — Assessment & Plan Note (Signed)
Reports chronic neck pain. States the Percocet does not seem to help with the neck pain. No prior imaging reported On exam, she has some tenderness of her cervical spine and some left trapezius tenderness Range of motion at the neck seems to be slightly limited  Plan: Will order x-ray of cervical spine to further evaluate this. Does not wish to try physical therapy at this time

## 2023-01-19 ENCOUNTER — Other Ambulatory Visit: Payer: Self-pay | Admitting: Family Medicine

## 2023-01-23 ENCOUNTER — Other Ambulatory Visit: Payer: Self-pay

## 2023-01-23 ENCOUNTER — Other Ambulatory Visit: Payer: 59

## 2023-01-23 NOTE — Patient Instructions (Signed)
Visit Information  Thank you for taking time to visit with me today. Please don't hesitate to contact me if I can be of assistance to you before our next scheduled telephone appointment.  Following are the goals we discussed today:   Goals Addressed             This Visit's Progress    RNCM Care Management  Expected Outcome:  Monitor, Self-Manage and Reduce Symptoms of: Depression       Current Barriers:  Knowledge Deficits related to effective management of pain and the impact her pain level has on her depression and stress level Care Coordination needs related to pain control and resources to help with dealing with her depression  in a patient with depression and high stress level Chronic Disease Management support and education needs related to effective management of depression  Lacks caregiver support.  Transportation barriers Grief and loss: Husband passed in 2014 and her son passed in 2022 of lung cancer   Planned Interventions: Evaluation of current treatment plan related to depression  and patient's adherence to plan as established by provider. The patient is doing well with her depression over all. The biggest thing is the pain she lives with daily. The patient is not feeling the best today. She feels if she could get her pain under control she would feel better. She is going to see her pain specialist on Thursday. She will talk to them about her back and neck pain. Reminder given to write down questions to ask the providers at her upcoming appointments with  the pain specialist.  Advised patient to call the office for changes in mood, anxiety, stress level, depression, or mental health needs Provided education to patient re: resources available for transportation needs, resources for effective management of pain and discomfort, resources for effective management of depression and ongoing support Reviewed medications with patient and discussed compliance. The patient states she  lays her medications out each night to take in the morning. Says she forgets things sometimes but she does not forget her medications. The patient wants to get her valium back and feels like this would help her. She will discuss with the specialist on Thursday. Review of making sure she discussed with the specialist due to guidelines of the pain regimen.  Provided patient with community resources and other resources to help in management of her chronic conditions educational materials related to effective management of depression Reviewed scheduled/upcoming provider appointments including 04-12-2023 at 1140 am Care Guide referral for transportation needs. Talked with the care guide and got numbers for transportation. She has talked to her insurance and she has so many rides that her insurance will pay for for appointments. The patient sees the pain specialist again on 01-25-2023.  Discussed plans with patient for ongoing care management follow up and provided patient with direct contact information for care management team Advised patient to discuss changes in her mood, depression, or mental health needs with provider Screening for signs and symptoms of depression related to chronic disease state  Assessed social determinant of health barriers  Symptom Management: Take medications as prescribed   Attend all scheduled provider appointments Call provider office for new concerns or questions  call the Suicide and Crisis Lifeline: 988 call the [place] National Suicide Prevention Lifeline: 270-227-5452 or TTY: 979 258 0924 TTY [phone]) to talk to a trained counselor call 1-800-273-TALK (toll free, 24 hour hotline) if experiencing a Mental Health or Behavioral Health Crisis   Follow Up Plan: Telephone follow  up appointment with care management team member scheduled for: 03-20-2023 at 230 pm       RNCM Care Management  Expected Outcome:  Monitor, Self-Manage and Reduce Symptoms of: Falls prevention  and Safety       Current Barriers:  Knowledge Deficits related to fall prevention and safety in a patient with multiple chronic conditions Care Coordination needs related to transportation concerns in a patient with frequent falls and chronic pain  Chronic Disease Management support and education needs related to fall prevention and safety Lacks caregiver support.  Transportation barriers  Planned Interventions: Provided  verbal education re: potential causes of falls and Fall prevention strategies. The patient states she is being careful. Denies any new falls. Is still having a lot of pain and is being careful. She is also having some low blood pressures. Discussed changes to monitor for.  Reviewed medications and discussed potential side effects of medications such as dizziness and frequent urination. The patient states she is compliant with medications. Does have dizziness at times. Review of changing position slowly and monitoring for drops in blood pressure. The patient is changing position slowly. She states she has had a couple of incidences of dizziness but takes her medications and lays down until it subsides. The patient is monitoring when she changes positions. Denies any new issues with falls and safety. Will continue to monitor.  Advised patient of importance of notifying provider of falls. Review and education provided Assessed for signs and symptoms of orthostatic hypotension. Review and education provided Assessed for falls since last encounter. Last fall was on 05-07-2022. The patient denies any new falls. States she is being careful. Assessed patients knowledge of fall risk prevention secondary to previously provided education. Review and education provided.  Provided patient information for fall alert systems Assessed working status of life alert bracelet and patient adherence Advised patient to discuss changes in safety in her environment, new questions or concerns related to her  chronic conditions and high fall risk with provider Screening for signs and symptoms of depression related to chronic disease state Assessed social determinant of health barriers  Symptom Management: Take medications as prescribed   Attend all scheduled provider appointments Call provider office for new concerns or questions  call the Suicide and Crisis Lifeline: 988 call the [place] National Suicide Prevention Lifeline: 6133698232 or TTY: 9411025604 TTY (909)224-2788) to talk to a trained counselor call 1-800-273-TALK (toll free, 24 hour hotline) if experiencing a Mental Health or Behavioral Health Crisis  Report new falls or safety concerns to the provider  Follow Up Plan: Telephone follow up appointment with care management team member scheduled for: 03-20-2023 at 230 pm       RNCM Care Management  Expected Outcome:  Monitor, Self-Manage and Reduce Symptoms of: HLD       Current Barriers:  Chronic Disease Management support and education needs related to effective management of HLD Lab Results  Component Value Date   CHOL 155 01/08/2023   HDL 68 01/08/2023   LDLCALC 73 01/08/2023   TRIG 71 01/08/2023   CHOLHDL 2.3 01/08/2023     Planned Interventions: Provider established cholesterol goals reviewed. The patient is at goal. Labs up to date and the patient is at goal. Praised for great numbers.  Counseled on importance of regular laboratory monitoring as prescribed. The patient has regular lab work Provided HLD Transport planner; Reviewed role and benefits of statin for ASCVD risk reduction. The patient is compliant with her Rosuvastatin 10 mg as directed;  Discussed strategies to manage statin-induced myalgias. The patient takes rosuvastatin 10 mg daily. The patient has chronic pain but does not have pain related to statins. Denies any new issues with medications. Has her medications. Is working with the pain specialist to help with pain control; Reviewed importance of  limiting foods high in cholesterol. Review of heart healthy diet. The patient states she is eating but since having her hernia surgery she has lost weight and does not have a good appetite. She stays hydrated. Denies any issues with dietary restrictions. ; Screening for signs and symptoms of depression related to chronic disease state;  Assessed social determinant of health barriers;   Symptom Management: Take medications as prescribed   Attend all scheduled provider appointments Attend church or other social activities Perform all self care activities independently  Perform IADL's (shopping, preparing meals, housekeeping, managing finances) independently Call provider office for new concerns or questions  Work with the social worker to address care coordination needs and will continue to work with the clinical team to address health care and disease management related needs call the Suicide and Crisis Lifeline: 988 call the [place] National Suicide Prevention Lifeline: 380-256-7659 or TTY: 773 639 2918 TTY (416) 582-9849) to talk to a trained counselor call 1-800-273-TALK (toll free, 24 hour hotline) if experiencing a Mental Health or Behavioral Health Crisis  - call for medicine refill 2 or 3 days before it runs out - take all medications exactly as prescribed - call doctor with any symptoms you believe are related to your medicine - call doctor when you experience any new symptoms - go to all doctor appointments as scheduled - adhere to prescribed diet: heart healthy/ADA diet   Follow Up Plan: Telephone follow up appointment with care management team member scheduled for: 03-20-2023 at 230 pm           Our next appointment is by telephone on 03-20-2023 at 230 pm  Please call the care guide team at [phone] if you need to cancel or reschedule your appointment.   If you are experiencing a Mental Health or Behavioral Health Crisis or need someone to talk to, please call the Suicide  and Crisis Lifeline: 988 call the [place] National Suicide Prevention Lifeline: 716-840-0462 or TTY: 7624230336 TTY 848-272-8930) to talk to a trained counselor call 1-800-273-TALK (toll free, 24 hour hotline) go to [facility] [address], [place] [phone])   Patient verbalizes understanding of instructions and care plan provided today and agrees to view in MyChart. Active MyChart status and patient understanding of how to access instructions and care plan via MyChart confirmed with patient.     Telephone follow up appointment with care management team member scheduled for: 03-20-2023 at 230 pm  Alto Denver RN, MSN, CCM RN Care Manager  [facility]  Ambulatory Care Management  Direct Number: [phone]

## 2023-01-23 NOTE — Patient Outreach (Signed)
Care Management   Visit Note  01/23/2023 Name: [name] [name] MRN: [id number] DOB: 01/08/1946  Subjective: [name] [name] is a 77 y.o. year old female who is a primary care patient of Cox, Kirsten, MD. The Care Management team was consulted for assistance.      Engaged with patient spoke with patient by telephone.    Goals Addressed             This Visit's Progress    RNCM Care Management  Expected Outcome:  Monitor, Self-Manage and Reduce Symptoms of: Depression       Current Barriers:  Knowledge Deficits related to effective management of pain and the impact her pain level has on her depression and stress level Care Coordination needs related to pain control and resources to help with dealing with her depression  in a patient with depression and high stress level Chronic Disease Management support and education needs related to effective management of depression  Lacks caregiver support.  Transportation barriers Grief and loss: Husband passed in 2014 and her son passed in 2022 of lung cancer   Planned Interventions: Evaluation of current treatment plan related to depression  and patient's adherence to plan as established by provider. The patient is doing well with her depression over all. The biggest thing is the pain she lives with daily. The patient is not feeling the best today. She feels if she could get her pain under control she would feel better. She is going to see her pain specialist on Thursday. She will talk to them about her back and neck pain. Reminder given to write down questions to ask the providers at her upcoming appointments with  the pain specialist.  Advised patient to call the office for changes in mood, anxiety, stress level, depression, or mental health needs Provided education to patient re: resources available for transportation needs, resources for effective management of pain and discomfort, resources for effective management of depression and ongoing  support Reviewed medications with patient and discussed compliance. The patient states she lays her medications out each night to take in the morning. Says she forgets things sometimes but she does not forget her medications. The patient wants to get her valium back and feels like this would help her. She will discuss with the specialist on Thursday. Review of making sure she discussed with the specialist due to guidelines of the pain regimen.  Provided patient with community resources and other resources to help in management of her chronic conditions educational materials related to effective management of depression Reviewed scheduled/upcoming provider appointments including 04-12-2023 at 1140 am Care Guide referral for transportation needs. Talked with the care guide and got numbers for transportation. She has talked to her insurance and she has so many rides that her insurance will pay for for appointments. The patient sees the pain specialist again on 01-25-2023.  Discussed plans with patient for ongoing care management follow up and provided patient with direct contact information for care management team Advised patient to discuss changes in her mood, depression, or mental health needs with provider Screening for signs and symptoms of depression related to chronic disease state  Assessed social determinant of health barriers  Symptom Management: Take medications as prescribed   Attend all scheduled provider appointments Call provider office for new concerns or questions  call the Suicide and Crisis Lifeline: 988 call the [place] National Suicide Prevention Lifeline: 820 069 1420 or TTY: (434)299-9461 TTY (251)598-1829) to talk to a trained counselor call 1-800-273-TALK (toll free,  24 hour hotline) if experiencing a Mental Health or Behavioral Health Crisis   Follow Up Plan: Telephone follow up appointment with care management team member scheduled for: 03-20-2023 at 230 pm       RNCM Care  Management  Expected Outcome:  Monitor, Self-Manage and Reduce Symptoms of: Falls prevention and Safety       Current Barriers:  Knowledge Deficits related to fall prevention and safety in a patient with multiple chronic conditions Care Coordination needs related to transportation concerns in a patient with frequent falls and chronic pain  Chronic Disease Management support and education needs related to fall prevention and safety Lacks caregiver support.  Transportation barriers  Planned Interventions: Provided  verbal education re: potential causes of falls and Fall prevention strategies. The patient states she is being careful. Denies any new falls. Is still having a lot of pain and is being careful. She is also having some low blood pressures. Discussed changes to monitor for.  Reviewed medications and discussed potential side effects of medications such as dizziness and frequent urination. The patient states she is compliant with medications. Does have dizziness at times. Review of changing position slowly and monitoring for drops in blood pressure. The patient is changing position slowly. She states she has had a couple of incidences of dizziness but takes her medications and lays down until it subsides. The patient is monitoring when she changes positions. Denies any new issues with falls and safety. Will continue to monitor.  Advised patient of importance of notifying provider of falls. Review and education provided Assessed for signs and symptoms of orthostatic hypotension. Review and education provided Assessed for falls since last encounter. Last fall was on 05-07-2022. The patient denies any new falls. States she is being careful. Assessed patients knowledge of fall risk prevention secondary to previously provided education. Review and education provided.  Provided patient information for fall alert systems Assessed working status of life alert bracelet and patient adherence Advised  patient to discuss changes in safety in her environment, new questions or concerns related to her chronic conditions and high fall risk with provider Screening for signs and symptoms of depression related to chronic disease state Assessed social determinant of health barriers  Symptom Management: Take medications as prescribed   Attend all scheduled provider appointments Call provider office for new concerns or questions  call the Suicide and Crisis Lifeline: 988 call the [place] National Suicide Prevention Lifeline: 3320372945 or TTY: (419)793-1620 TTY (684)284-6601) to talk to a trained counselor call 1-800-273-TALK (toll free, 24 hour hotline) if experiencing a Mental Health or Behavioral Health Crisis  Report new falls or safety concerns to the provider  Follow Up Plan: Telephone follow up appointment with care management team member scheduled for: 03-20-2023 at 230 pm       RNCM Care Management  Expected Outcome:  Monitor, Self-Manage and Reduce Symptoms of: HLD       Current Barriers:  Chronic Disease Management support and education needs related to effective management of HLD Lab Results  Component Value Date   CHOL 155 01/08/2023   HDL 68 01/08/2023   LDLCALC 73 01/08/2023   TRIG 71 01/08/2023   CHOLHDL 2.3 01/08/2023     Planned Interventions: Provider established cholesterol goals reviewed. The patient is at goal. Labs up to date and the patient is at goal. Praised for great numbers.  Counseled on importance of regular laboratory monitoring as prescribed. The patient has regular lab work Provided HLD Transport planner; Reviewed role  and benefits of statin for ASCVD risk reduction. The patient is compliant with her Rosuvastatin 10 mg as directed; Discussed strategies to manage statin-induced myalgias. The patient takes rosuvastatin 10 mg daily. The patient has chronic pain but does not have pain related to statins. Denies any new issues with medications. Has her  medications. Is working with the pain specialist to help with pain control; Reviewed importance of limiting foods high in cholesterol. Review of heart healthy diet. The patient states she is eating but since having her hernia surgery she has lost weight and does not have a good appetite. She stays hydrated. Denies any issues with dietary restrictions. ; Screening for signs and symptoms of depression related to chronic disease state;  Assessed social determinant of health barriers;   Symptom Management: Take medications as prescribed   Attend all scheduled provider appointments Attend church or other social activities Perform all self care activities independently  Perform IADL's (shopping, preparing meals, housekeeping, managing finances) independently Call provider office for new concerns or questions  Work with the social worker to address care coordination needs and will continue to work with the clinical team to address health care and disease management related needs call the Suicide and Crisis Lifeline: 988 call the [place] National Suicide Prevention Lifeline: 412-270-9350 or TTY: 724-438-1455 TTY 850-776-5179) to talk to a trained counselor call 1-800-273-TALK (toll free, 24 hour hotline) if experiencing a Mental Health or Behavioral Health Crisis  - call for medicine refill 2 or 3 days before it runs out - take all medications exactly as prescribed - call doctor with any symptoms you believe are related to your medicine - call doctor when you experience any new symptoms - go to all doctor appointments as scheduled - adhere to prescribed diet: heart healthy/ADA diet   Follow Up Plan: Telephone follow up appointment with care management team member scheduled for: 03-20-2023 at 230 pm           Consent to Services:  Patient was given information about care management services, agreed to services, and gave verbal consent to participate.   Plan: Telephone follow up appointment  with care management team member scheduled for: 03-20-2023 at 230 pm  Alto Denver RN, MSN, CCM RN Care Manager  [facility]  Ambulatory Care Management  Direct Number: [phone]

## 2023-01-25 DIAGNOSIS — M47812 Spondylosis without myelopathy or radiculopathy, cervical region: Secondary | ICD-10-CM | POA: Diagnosis not present

## 2023-01-25 DIAGNOSIS — M47816 Spondylosis without myelopathy or radiculopathy, lumbar region: Secondary | ICD-10-CM | POA: Diagnosis not present

## 2023-01-25 DIAGNOSIS — M50322 Other cervical disc degeneration at C5-C6 level: Secondary | ICD-10-CM | POA: Diagnosis not present

## 2023-01-25 DIAGNOSIS — M48061 Spinal stenosis, lumbar region without neurogenic claudication: Secondary | ICD-10-CM | POA: Diagnosis not present

## 2023-01-25 DIAGNOSIS — M5136 Other intervertebral disc degeneration, lumbar region: Secondary | ICD-10-CM | POA: Diagnosis not present

## 2023-01-25 DIAGNOSIS — M545 Low back pain, unspecified: Secondary | ICD-10-CM | POA: Diagnosis not present

## 2023-01-25 DIAGNOSIS — G8929 Other chronic pain: Secondary | ICD-10-CM | POA: Diagnosis not present

## 2023-01-27 ENCOUNTER — Other Ambulatory Visit: Payer: Self-pay | Admitting: Cardiology

## 2023-01-27 ENCOUNTER — Other Ambulatory Visit: Payer: Self-pay | Admitting: Family Medicine

## 2023-01-27 DIAGNOSIS — R42 Dizziness and giddiness: Secondary | ICD-10-CM

## 2023-01-29 ENCOUNTER — Other Ambulatory Visit: Payer: Self-pay | Admitting: Family Medicine

## 2023-01-29 DIAGNOSIS — F322 Major depressive disorder, single episode, severe without psychotic features: Secondary | ICD-10-CM

## 2023-02-01 ENCOUNTER — Other Ambulatory Visit: Payer: Self-pay | Admitting: Family Medicine

## 2023-02-01 ENCOUNTER — Telehealth: Payer: Self-pay

## 2023-02-01 NOTE — Telephone Encounter (Signed)
Patient called to get results of her spine x-ray.  Made patient aware that her x-ray has not been read, I called [facility] radiology and spoke with cheri and she stated she will get it read today. Made patient aware, we will  print off x-ray and call her back once provider has viewed the x-ray.

## 2023-02-01 NOTE — Telephone Encounter (Signed)
Called patient  to make her aware of results of x-ray. Dr. Faylene Kurtz has sent her a message in my chart of results as well. Unable to leave voicemail.

## 2023-02-22 ENCOUNTER — Other Ambulatory Visit: Payer: Self-pay

## 2023-02-22 DIAGNOSIS — F339 Major depressive disorder, recurrent, unspecified: Secondary | ICD-10-CM

## 2023-02-22 DIAGNOSIS — I10 Essential (primary) hypertension: Secondary | ICD-10-CM

## 2023-02-22 DIAGNOSIS — G894 Chronic pain syndrome: Secondary | ICD-10-CM | POA: Diagnosis not present

## 2023-02-22 DIAGNOSIS — M48061 Spinal stenosis, lumbar region without neurogenic claudication: Secondary | ICD-10-CM | POA: Diagnosis not present

## 2023-02-22 DIAGNOSIS — M47816 Spondylosis without myelopathy or radiculopathy, lumbar region: Secondary | ICD-10-CM | POA: Diagnosis not present

## 2023-02-22 MED ORDER — CLONIDINE HCL 0.1 MG PO TABS: 0.1000 mg | ORAL_TABLET | Freq: Two times a day (BID) | ORAL | 1 refills | Status: DC

## 2023-02-28 ENCOUNTER — Other Ambulatory Visit: Payer: Self-pay | Admitting: Family Medicine

## 2023-03-06 ENCOUNTER — Other Ambulatory Visit: Payer: Self-pay | Admitting: Cardiology

## 2023-03-07 ENCOUNTER — Other Ambulatory Visit: Payer: Self-pay

## 2023-03-07 DIAGNOSIS — J410 Simple chronic bronchitis: Secondary | ICD-10-CM

## 2023-03-07 MED ORDER — ALBUTEROL SULFATE (2.5 MG/3ML) 0.083% IN NEBU: 2.5000 mg | INHALATION_SOLUTION | Freq: Four times a day (QID) | RESPIRATORY_TRACT | 6 refills | Status: DC | PRN

## 2023-03-20 ENCOUNTER — Other Ambulatory Visit: Payer: Self-pay | Admitting: *Deleted

## 2023-03-20 ENCOUNTER — Other Ambulatory Visit: Payer: Self-pay

## 2023-03-20 NOTE — Patient Instructions (Signed)
Visit Information  Thank you for taking time to visit with me today. Please don't hesitate to contact me if I can be of assistance to you before our next scheduled telephone appointment.  Following are the goals we discussed today:   Goals Addressed             This Visit's Progress    RNCM Care Management  Expected Outcome:  Monitor, Self-Manage and Reduce Symptoms of: Depression       Current Barriers:  Knowledge Deficits related to effective management of pain and the impact her pain level has on her depression and stress level Care Coordination needs related to pain control and resources to help with dealing with her depression  in a patient with depression and high stress level Chronic Disease Management support and education needs related to effective management of depression  Lacks caregiver support.  Transportation barriers Grief and loss: Husband passed in 2014 and her son passed in 2022 of lung cancer   Planned Interventions: Evaluation of current treatment plan related to depression  and patient's adherence to plan as established by provider. The patient is doing well with her depression over all. The biggest thing is the pain she lives with daily. She is having concerns related to her loss of balance and frequent falls and brace needs. CM provided reminder to write down questions to ask the providers at her upcoming appointments. Advised patient to call the office for changes in mood, anxiety, stress level, depression, or mental health needs Provided education to patient re: resources available for transportation needs, resources for effective management of pain and discomfort, resources for effective management of depression and ongoing support Reviewed medications with patient and discussed compliance. The patient states she lays her medications out each night to take in the morning. Says she forgets things sometimes but she does not forget her medications. The patient wants to  get her valium back and feels like this would help her. She will discuss with the specialist on Thursday. Review of making sure she discussed with the specialist due to guidelines of the pain regimen.  Provided patient with community resources and other resources to help in management of her chronic conditions educational materials related to effective management of depression Reviewed scheduled/upcoming provider appointments including 04-12-2023 at 1140 am Care Guide referral for transportation needs. Talked with the care guide and got numbers for transportation. She has talked to her insurance and she has so many rides that her insurance will pay for for appointments.  Discussed plans with patient for ongoing care management follow up and provided patient with direct contact information for care management team Advised patient to discuss changes in her mood, depression, or mental health needs with provider Screening for signs and symptoms of depression related to chronic disease state  Assessed social determinant of health barriers  Symptom Management: Take medications as prescribed   Attend all scheduled provider appointments Call provider office for new concerns or questions  call the Suicide and Crisis Lifeline: 988 call the [place] National Suicide Prevention Lifeline: 928-644-2828 or TTY: (253) 651-8406 TTY (940) 026-3315) to talk to a trained counselor call 1-800-273-TALK (toll free, 24 hour hotline) if experiencing a Mental Health or Behavioral Health Crisis   Follow Up Plan: Telephone follow up appointment with care management team member scheduled for: 05-02-2023 at 230 pm       RNCM Care Management  Expected Outcome:  Monitor, Self-Manage and Reduce Symptoms of: Falls prevention and Safety  Current Barriers:  Knowledge Deficits related to fall prevention and safety in a patient with multiple chronic conditions Care Coordination needs related to transportation concerns in a patient  with frequent falls and chronic pain  Chronic Disease Management support and education needs related to fall prevention and safety Lacks caregiver support.  Transportation barriers  Planned Interventions: Provided  verbal education re: potential causes of falls and Fall prevention strategies. The patient states she is being careful. Denies any new falls. Is still having a lot of pain and is being careful. She is also having some low blood pressures. Discussed changes to monitor for.  Reviewed medications and discussed potential side effects of medications such as dizziness and frequent urination. The patient states she is compliant with medications. Does have dizziness at times. Review of changing position slowly and monitoring for drops in blood pressure. The patient is changing position slowly. She states she has had a couple of incidences of dizziness but takes her medications and lays down until it subsides. The patient is monitoring when she changes positions. Denies any new issues with falls and safety. Will continue to monitor. Expresses concerns related to increased loss of balance and state that she has inquired about a back brace. Advised patient of importance of notifying provider of falls. Review and education provided Assessed for signs and symptoms of orthostatic hypotension. Review and education provided Assessed for falls since last encounter. The patient denies any new fall but state she had a near fall and was able to correct it my catching on to her refrigerator.  Assessed patients knowledge of fall risk prevention secondary to previously provided education. Review and education provided.  Provided patient information for fall alert systems Assessed working status of life alert bracelet and patient adherence Advised patient to discuss changes in safety in her environment, new questions or concerns related to her chronic conditions and high fall risk with provider Screening for signs  and symptoms of depression related to chronic disease state Assessed social determinant of health barriers  Symptom Management: Take medications as prescribed   Attend all scheduled provider appointments Call provider office for new concerns or questions  call the Suicide and Crisis Lifeline: 988 call the [place] National Suicide Prevention Lifeline: 231-050-0112 or TTY: 6086562320 TTY 579-661-1112) to talk to a trained counselor call 1-800-273-TALK (toll free, 24 hour hotline) if experiencing a Mental Health or Behavioral Health Crisis  Report new falls or safety concerns to the provider  Follow Up Plan: Telephone follow up appointment with care management team member scheduled for: 05-02-2023 at 230 pm       RNCM Care Management  Expected Outcome:  Monitor, Self-Manage and Reduce Symptoms of: HLD       Current Barriers:  Chronic Disease Management support and education needs related to effective management of HLD Lab Results  Component Value Date   CHOL 155 01/08/2023   HDL 68 01/08/2023   LDLCALC 73 01/08/2023   TRIG 71 01/08/2023   CHOLHDL 2.3 01/08/2023     Planned Interventions: Provider established cholesterol goals reviewed. The patient is at goal. Labs up to date and the patient is at goal. Praised for great numbers.  Counseled on importance of regular laboratory monitoring as prescribed. The patient has regular lab work Provided HLD Transport planner; Reviewed role and benefits of statin for ASCVD risk reduction. The patient is compliant with her Rosuvastatin 10 mg as directed; Discussed strategies to manage statin-induced myalgias. The patient takes rosuvastatin 10 mg daily. The patient  has chronic pain but does not have pain related to statins. Denies any new issues with medications. Has her medications. Is working with the pain specialist to help with pain control; Reviewed importance of limiting foods high in cholesterol. Review of heart healthy diet. The patient  states she is eating but since having her hernia surgery she has lost weight and does not have a good appetite. She stays hydrated. She states that she has a pain in her mid upper abdomen area and CM suggested she follow up with her GI Chales Abrahams) and she stated she will talk with her PCP in November if the pain does not go away.  Denies any issues with dietary restrictions. ; Screening for signs and symptoms of depression related to chronic disease state;  Assessed social determinant of health barriers;   Symptom Management: Take medications as prescribed   Attend all scheduled provider appointments Attend church or other social activities Perform all self care activities independently  Perform IADL's (shopping, preparing meals, housekeeping, managing finances) independently Call provider office for new concerns or questions  Work with the social worker to address care coordination needs and will continue to work with the clinical team to address health care and disease management related needs call the Suicide and Crisis Lifeline: 988 call the [place] National Suicide Prevention Lifeline: (707) 032-4261 or TTY: 726-188-4440 TTY 217-100-3471) to talk to a trained counselor call 1-800-273-TALK (toll free, 24 hour hotline) if experiencing a Mental Health or Behavioral Health Crisis  - call for medicine refill 2 or 3 days before it runs out - take all medications exactly as prescribed - call doctor with any symptoms you believe are related to your medicine - call doctor when you experience any new symptoms - go to all doctor appointments as scheduled - adhere to prescribed diet: heart healthy/ADA diet   Follow Up Plan: Telephone follow up appointment with care management team member scheduled for: 05-02-2023 at 230 pm           Our next appointment is by telephone on 05-02-2023 at 230pm  Please call the care guide team at [phone] if you need to cancel or reschedule your appointment.   If  you are experiencing a Mental Health or Behavioral Health Crisis or need someone to talk to, please call the Suicide and Crisis Lifeline: 988 call the [place] National Suicide Prevention Lifeline: (864)549-1792 or TTY: 863 251 2162 TTY (860)516-1267) to talk to a trained counselor call 1-800-273-TALK (toll free, 24 hour hotline)   Patient verbalizes understanding of instructions and care plan provided today and agrees to view in MyChart. Active MyChart status and patient understanding of how to access instructions and care plan via MyChart confirmed with patient.     Danise Edge, BSN RN RN Care Manager  [facility]  Direct Number: [phone]

## 2023-03-20 NOTE — Patient Outreach (Signed)
Care Management   Visit Note  03/20/2023 Name: [name] [name] MRN: [id number] DOB: [birth year]  Subjective: [name] [name] is a 77 y.o. year old female who is a primary care patient of Cox, Kirsten, MD. The Care Management team was consulted for assistance.      Engaged with patient spoke with patient by telephone.    Goals Addressed             This Visit's Progress    RNCM Care Management  Expected Outcome:  Monitor, Self-Manage and Reduce Symptoms of: Depression       Current Barriers:  Knowledge Deficits related to effective management of pain and the impact her pain level has on her depression and stress level Care Coordination needs related to pain control and resources to help with dealing with her depression  in a patient with depression and high stress level Chronic Disease Management support and education needs related to effective management of depression  Lacks caregiver support.  Transportation barriers Grief and loss: Husband passed in 2014 and her son passed in 2022 of lung cancer   Planned Interventions: Evaluation of current treatment plan related to depression  and patient's adherence to plan as established by provider. The patient is doing well with her depression over all. The biggest thing is the pain she lives with daily. She is having concerns related to her loss of balance and frequent falls and brace needs. CM provided reminder to write down questions to ask the providers at her upcoming appointments. Advised patient to call the office for changes in mood, anxiety, stress level, depression, or mental health needs Provided education to patient re: resources available for transportation needs, resources for effective management of pain and discomfort, resources for effective management of depression and ongoing support Reviewed medications with patient and discussed compliance. The patient states she lays her medications out each night to take in the morning. Says  she forgets things sometimes but she does not forget her medications. The patient wants to get her valium back and feels like this would help her. She will discuss with the specialist on Thursday. Review of making sure she discussed with the specialist due to guidelines of the pain regimen.  Provided patient with community resources and other resources to help in management of her chronic conditions educational materials related to effective management of depression Reviewed scheduled/upcoming provider appointments including 04-12-2023 at 1140 am Care Guide referral for transportation needs. Talked with the care guide and got numbers for transportation. She has talked to her insurance and she has so many rides that her insurance will pay for for appointments.  Discussed plans with patient for ongoing care management follow up and provided patient with direct contact information for care management team Advised patient to discuss changes in her mood, depression, or mental health needs with provider Screening for signs and symptoms of depression related to chronic disease state  Assessed social determinant of health barriers  Symptom Management: Take medications as prescribed   Attend all scheduled provider appointments Call provider office for new concerns or questions  call the Suicide and Crisis Lifeline: 988 call the [place] National Suicide Prevention Lifeline: 684-050-5882 or TTY: (928) 428-1327 TTY (612)704-5938) to talk to a trained counselor call 1-800-273-TALK (toll free, 24 hour hotline) if experiencing a Mental Health or Behavioral Health Crisis   Follow Up Plan: Telephone follow up appointment with care management team member scheduled for: 05-02-2023 at 230 pm       [facility] Care Management  Expected Outcome:  Monitor, Self-Manage and Reduce Symptoms of: Falls prevention and Safety       Current Barriers:  Knowledge Deficits related to fall prevention and safety in a patient with multiple  chronic conditions Care Coordination needs related to transportation concerns in a patient with frequent falls and chronic pain  Chronic Disease Management support and education needs related to fall prevention and safety Lacks caregiver support.  Transportation barriers  Planned Interventions: Provided  verbal education re: potential causes of falls and Fall prevention strategies. The patient states she is being careful. Denies any new falls. Is still having a lot of pain and is being careful. She is also having some low blood pressures. Discussed changes to monitor for.  Reviewed medications and discussed potential side effects of medications such as dizziness and frequent urination. The patient states she is compliant with medications. Does have dizziness at times. Review of changing position slowly and monitoring for drops in blood pressure. The patient is changing position slowly. She states she has had a couple of incidences of dizziness but takes her medications and lays down until it subsides. The patient is monitoring when she changes positions. Denies any new issues with falls and safety. Will continue to monitor. Expresses concerns related to increased loss of balance and state that she has inquired about a back brace. Advised patient of importance of notifying provider of falls. Review and education provided Assessed for signs and symptoms of orthostatic hypotension. Review and education provided Assessed for falls since last encounter. The patient denies any new fall but state she had a near fall and was able to correct it my catching on to her refrigerator.  Assessed patients knowledge of fall risk prevention secondary to previously provided education. Review and education provided.  Provided patient information for fall alert systems Assessed working status of life alert bracelet and patient adherence Advised patient to discuss changes in safety in her environment, new questions or  concerns related to her chronic conditions and high fall risk with provider Screening for signs and symptoms of depression related to chronic disease state Assessed social determinant of health barriers  Symptom Management: Take medications as prescribed   Attend all scheduled provider appointments Call provider office for new concerns or questions  call the Suicide and Crisis Lifeline: 988 call the [place] National Suicide Prevention Lifeline: 270-426-8084 or TTY: 772-335-6700 TTY (517) 053-3572) to talk to a trained counselor call 1-800-273-TALK (toll free, 24 hour hotline) if experiencing a Mental Health or Behavioral Health Crisis  Report new falls or safety concerns to the provider  Follow Up Plan: Telephone follow up appointment with care management team member scheduled for: 05-02-2023 at 230 pm       RNCM Care Management  Expected Outcome:  Monitor, Self-Manage and Reduce Symptoms of: HLD       Current Barriers:  Chronic Disease Management support and education needs related to effective management of HLD Lab Results  Component Value Date   CHOL 155 01/08/2023   HDL 68 01/08/2023   LDLCALC 73 01/08/2023   TRIG 71 01/08/2023   CHOLHDL 2.3 01/08/2023     Planned Interventions: Provider established cholesterol goals reviewed. The patient is at goal. Labs up to date and the patient is at goal. Praised for great numbers.  Counseled on importance of regular laboratory monitoring as prescribed. The patient has regular lab work Provided HLD Transport planner; Reviewed role and benefits of statin for ASCVD risk reduction. The patient is compliant with her Rosuvastatin  10 mg as directed; Discussed strategies to manage statin-induced myalgias. The patient takes rosuvastatin 10 mg daily. The patient has chronic pain but does not have pain related to statins. Denies any new issues with medications. Has her medications. Is working with the pain specialist to help with pain  control; Reviewed importance of limiting foods high in cholesterol. Review of heart healthy diet. The patient states she is eating but since having her hernia surgery she has lost weight and does not have a good appetite. She stays hydrated. She states that she has a pain in her mid upper abdomen area and CM suggested she follow up with her GI Chales Abrahams) and she stated she will talk with her PCP in November if the pain does not go away.  Denies any issues with dietary restrictions. ; Screening for signs and symptoms of depression related to chronic disease state;  Assessed social determinant of health barriers;   Symptom Management: Take medications as prescribed   Attend all scheduled provider appointments Attend church or other social activities Perform all self care activities independently  Perform IADL's (shopping, preparing meals, housekeeping, managing finances) independently Call provider office for new concerns or questions  Work with the social worker to address care coordination needs and will continue to work with the clinical team to address health care and disease management related needs call the Suicide and Crisis Lifeline: 988 call the [place] National Suicide Prevention Lifeline: (416)863-1671 or TTY: 539-875-5995 TTY 512-747-0472) to talk to a trained counselor call 1-800-273-TALK (toll free, 24 hour hotline) if experiencing a Mental Health or Behavioral Health Crisis  - call for medicine refill 2 or 3 days before it runs out - take all medications exactly as prescribed - call doctor with any symptoms you believe are related to your medicine - call doctor when you experience any new symptoms - go to all doctor appointments as scheduled - adhere to prescribed diet: heart healthy/ADA diet   Follow Up Plan: Telephone follow up appointment with care management team member scheduled for: 05-02-2023 at 230 pm             Consent to Services:  Patient was given information  about care management services, agreed to services, and gave verbal consent to participate.   Plan: Telephone follow up appointment with care management team member scheduled for: 05-02-2023 at 2:30pm  Danise Edge, BSN RN RN Care Manager  [facility]  Ambulatory Care Management  Direct Number: [phone]

## 2023-03-29 ENCOUNTER — Other Ambulatory Visit: Payer: Self-pay | Admitting: Family Medicine

## 2023-03-29 DIAGNOSIS — M51369 Other intervertebral disc degeneration, lumbar region without mention of lumbar back pain or lower extremity pain: Secondary | ICD-10-CM | POA: Diagnosis not present

## 2023-03-29 DIAGNOSIS — M48061 Spinal stenosis, lumbar region without neurogenic claudication: Secondary | ICD-10-CM | POA: Diagnosis not present

## 2023-03-29 DIAGNOSIS — G894 Chronic pain syndrome: Secondary | ICD-10-CM | POA: Diagnosis not present

## 2023-03-29 DIAGNOSIS — F339 Major depressive disorder, recurrent, unspecified: Secondary | ICD-10-CM

## 2023-03-29 DIAGNOSIS — M47816 Spondylosis without myelopathy or radiculopathy, lumbar region: Secondary | ICD-10-CM | POA: Diagnosis not present

## 2023-03-29 DIAGNOSIS — M545 Low back pain, unspecified: Secondary | ICD-10-CM | POA: Diagnosis not present

## 2023-04-12 NOTE — Assessment & Plan Note (Signed)
Well managed with Crestor 10 mg daily Will order repeat lipid panel Recommend to continue low calorie diet

## 2023-04-12 NOTE — Progress Notes (Signed)
Subjective:  Patient ID: [name] [name], female    DOB: 1945-11-28  Age: 77 y.o. MRN: [id number]  No chief complaint on file.   HPI   Hyperlipidemia.   Medications: Rosuvastatin 10 mg daily without problems.    Hypertension.   Medications: Lisinopril-Hctz 20-12.5 mg 2 tablets every morning, Clonidine 0.1 twice a day , aspirin 81 mg daily well without side effects.   Does not check home Bps. Denies dizziness.   COPD: Breztri 2 puffs twice daily, Albuterol nebulizer  2.5 mg every 6 hours as needed , albuterol inhaler 2 puffs twice a day. Tobacco 1 ppd. Uses albuterol about once daily.     GERD: Omeprazole 40 mg daily still gets GERD and uses every day.    CORONARY ARTERY DISEASE: non obstructive CORONARY ARTERY DISEASE. Patient sees Dr. Tomie China.  On rosuvastatin 10 mg before bed and Baby asprin. .    Depression/GAD: Wellbutrin xl 150 mg one in am. Hydroxyzine 25 mg every 8 hours as needed. Takes diazepam 5 mg once daily for panic attacks.  Trazodone 100 mg before bed for insomnia.    Chronic pain syndrome:  Mid thoracic back pain and radiates to lumbar region and then goes around to the sides.  She is seeing in the pain clinic and they sent oxycodone-acetaminophen 7.5-325 mg every 6 hours.  Next appt on      08/01/2022    3:48 PM 06/26/2022   11:09 AM 02/14/2022    2:55 PM 11/03/2021   11:29 AM 08/11/2021   11:14 AM  Depression screen PHQ 2/9  Decreased Interest 0 0 1 1 1   Down, Depressed, Hopeless 1 1 2 1 3   PHQ - 2 Score 1 1 3 2 4   Altered sleeping 3 3 2 3 3   Tired, decreased energy 3 3 3 3 3   Change in appetite 2 2 3 3 3   Feeling bad or failure about yourself  0 0 2 0 3  Trouble concentrating 2 2 2 3 3   Moving slowly or fidgety/restless 1 1 2 1 3   Suicidal thoughts 0 0 0 0 0  PHQ-9 Score 12 12 17 15 22   Difficult doing work/chores Not difficult at all Not difficult at all Somewhat difficult Not difficult at all Very difficult        10/12/2022    3:02 PM  Fall Risk    Falls in the past year? 1  Number falls in past yr: 0  Injury with Fall? 1  Risk for fall due to : History of fall(s);Impaired balance/gait;Impaired mobility;Orthopedic patient;Other (Comment)  Risk for fall due to: Comment Chronic back pain and discomfort  Follow up Falls evaluation completed;Education provided;Falls prevention discussed;Follow up appointment    Patient Care Team: Blane Ohara, MD as PCP - General (Internal Medicine) Thomasene Ripple, DO as PCP - Cardiology (Cardiology) Marlowe Sax, RN as Case Manager (General Practice)   Review of Systems  Current Outpatient Medications on File Prior to Visit  Medication Sig Dispense Refill   albuterol (PROVENTIL) (2.5 MG/3ML) 0.083% nebulizer solution Take 3 mLs (2.5 mg total) by nebulization every 6 (six) hours as needed for wheezing or shortness of breath. 120 mL 6   aspirin EC 81 MG tablet Take 1 tablet (81 mg total) by mouth daily. Swallow whole. 90 tablet 3   Budeson-Glycopyrrol-Formoterol (BREZTRI AEROSPHERE) 160-9-4.8 MCG/ACT AERO INHALE 2 PUFFS BY MOUTH AND INTO THE LUNGS TWICE DAILY AS NEEDED FOR SHORTNESS OF BREATH 10.7 g 0   buPROPion (  WELLBUTRIN XL) 150 MG 24 hr tablet TAKE 1 TABLET BY MOUTH EVERY MORNING 30 tablet 0   cloNIDine (CATAPRES) 0.1 MG tablet Take 1 tablet (0.1 mg total) by mouth 2 (two) times daily. 180 tablet 1   diazepam (VALIUM) 5 MG tablet TAKE 1 TABLET BY MOUTH ONCE DAILY AS NEEDED FOR ANXIETY *WATCH FOR SEDATION*     finasteride (PROPECIA) 1 MG tablet TAKE ONE TABLET BY MOUTH DAILY 30 tablet 3   hydrOXYzine (VISTARIL) 25 MG capsule Take 1 capsule (25 mg total) by mouth every 8 (eight) hours as needed. 30 capsule 0   ibuprofen (ADVIL) 800 MG tablet TAKE ONE TABLET BY MOUTH every EIGHT hours AS NEEDED FOR moderate pain 90 tablet 2   isosorbide mononitrate (IMDUR) 30 MG 24 hr tablet Take 0.5 tablets (15 mg total) by mouth daily. Patient needs appointment for further refills. 1 st attempt 15 tablet 10    lisinopril-hydrochlorothiazide (ZESTORETIC) 20-12.5 MG tablet TAKE TWO (2) TABLETS BY MOUTH EVERY MORNING 180 tablet 1   meclizine (ANTIVERT) 25 MG tablet TAKE 1 TABLET BY MOUTH THREE TIMES DAILY AS NEEDED FOR DIZZINESS *REFILL REQUEST* 180 tablet 1   nitroGLYCERIN (NITROSTAT) 0.4 MG SL tablet Place 0.4 mg under the tongue every 5 (five) minutes as needed for chest pain.     omeprazole (PRILOSEC) 40 MG capsule TAKE 1 CAPSULE BY MOUTH EVERY MORNING 180 capsule 1   oxyCODONE-acetaminophen (PERCOCET) 7.5-325 MG tablet Take 1 tablet by mouth every 6 (six) hours as needed.     potassium chloride (KLOR-CON M) 10 MEQ tablet TAKE 1 TABLET BY MOUTH ONCE DAILY 90 tablet 1   promethazine (PHENERGAN) 25 MG tablet TAKE ONE TABLET BY MOUTH every SIX hours AS NEEDED FOR NAUSEA AND VOMITING 30 tablet 3   rosuvastatin (CRESTOR) 10 MG tablet TAKE 1 TABLET BY MOUTH EVERY DAY AT BEDTIME 30 tablet 2   traZODone (DESYREL) 100 MG tablet TAKE 1 TABLET BY MOUTH DAILY AT BEDTIME 90 tablet 1   Vitamin D, Ergocalciferol, (DRISDOL) 1.25 MG (50000 UNIT) CAPS capsule TAKE ONE CAPSULE BY MOUTH ONCE WEEKLY ON MONDAY 12 capsule 2   No current facility-administered medications on file prior to visit.   Past Medical History:  Diagnosis Date   Anxiety    Aortic atherosclerosis (HCC)    Ascending aorta dilation (HCC)    a.) TTE 09/02/19 --> mild; measured 39mm.   Asthma    BMI 24.0-24.9, adult 02/14/2022   CAD (coronary artery disease)    a,) CTA 08/24/20 --> mild CAD (25-49%) in RCA, LAD, LCx; CAC/Agatston score 509.   Cataract    Chronic left shoulder pain 09/27/2015   COPD (chronic obstructive pulmonary disease) (HCC)    Coronary artery disease involving native coronary artery of native heart without angina pectoris 11/18/2020   Degenerative joint disease involving multiple joints 09/28/2017   Depression    Depression, major, single episode, severe (HCC) 03/30/2020   Diastolic dysfunction    a.) TTE 09/02/19 --> LVEF 60-65%;  G2DD. b.) TTE 07/23/20 --> LVEF 70-75%; G1DD.   Enlarged aorta (HCC)    Essential hypertension 08/07/2019   GERD (gastroesophageal reflux disease)    GERD with esophagitis 08/07/2019   History of kidney stones    Hyperlipidemia    Hypertension    Long-term current use of opiate analgesic 06/25/2017   Lupus (HCC)    Myocardial infarction (HCC)    Osteoarthritis    PAC (premature atrial contraction)    a.) Holter 12/04/19 --> occassional;  1.1% PAC burden.   Pain of right hip joint 11/22/2015   Paraesophageal hernia    PAT (paroxysmal atrial tachycardia) 02/03/2020   Presbyesophagus    Primary osteoarthritis of left knee 02/04/2015   PSVT (paroxysmal supraventricular tachycardia)    a.) Holter 12/04/19 --> 22 runs.   Renal calculus 12/21/2021   S/P repair of paraesophageal hernia 02/22/2021   S/P total knee arthroplasty 02/15/2015   Schatzki's ring    Sciatica 01/31/2022   Tobacco use 08/19/2019   Type 2 diabetes mellitus with other specified complication (HCC) 08/07/2019   Past Surgical History:  Procedure Laterality Date   ABDOMINAL HYSTERECTOMY     BLADDER SUSPENSION     BREAST BIOPSY Right    COLONOSCOPY  09/04/2011   Colonic polyps, status post polyectomy. Incidental small ascending colon lipoma   ESOPHAGOGASTRODUODENOSCOPY  09/08/2015   Schatzki ring status post esophageal dilitation. Small hiatal hernia   hemorrhoid surgery     KNEE SURGERY     left   TOTAL KNEE ARTHROPLASTY Left 02/15/2015   Procedure: TOTAL KNEE ARTHROPLASTY;  Surgeon: Dannielle Huh, MD;  Location: [facility] OR;  Service: Orthopedics;  Laterality: Left;   TUBAL LIGATION     WRIST SURGERY     right   XI ROBOTIC ASSISTED PARAESOPHAGEAL HERNIA REPAIR N/A 02/22/2021   Procedure: XI ROBOTIC ASSISTED PARAESOPHAGEAL HERNIA REPAIR with RNFA to assist;  Surgeon: Leafy Ro, MD;  Location: [facility] ORS;  Service: General;  Laterality: N/A;    Family History  Problem Relation Age of Onset   Stroke Mother    Heart disease  Mother    COPD Father    Cancer Father        Bone   Diabetes Father    Heart disease Father    Stroke Father    Lupus Sister    Seizures Son    COPD Son    Colon cancer Neg Hx    Esophageal cancer Neg Hx    Stomach cancer Neg Hx    Rectal cancer Neg Hx    Social History   Socioeconomic History   Marital status: Widowed    Spouse name: Not on file   Number of children: 4   Years of education: Not on file   Highest education level: Not on file  Occupational History   Occupation: Retired  Tobacco Use   Smoking status: Every Day    Current packs/day: 1.00    Average packs/day: 1 pack/day for 48.0 years (48.0 ttl pk-yrs)    Types: Cigarettes   Smokeless tobacco: Never  Vaping Use   Vaping status: Never Used  Substance and Sexual Activity   Alcohol use: No   Drug use: No   Sexual activity: Not Currently  Other Topics Concern   Not on file  Social History Narrative   Not on file   Social Determinants of Health   Financial Resource Strain: Low Risk  (05/11/2022)   Overall Financial Resource Strain (CARDIA)    Difficulty of Paying Living Expenses: Not hard at all  Food Insecurity: No Food Insecurity (08/01/2022)   Hunger Vital Sign    Worried About Running Out of Food in the Last Year: Never true    Ran Out of Food in the Last Year: Never true  Transportation Needs: Unmet Transportation Needs (08/01/2022)   PRAPARE - Transportation    Lack of Transportation (Medical): Yes    Lack of Transportation (Non-Medical): Yes  Physical Activity: Inactive (08/01/2022)   Exercise Vital Sign  Days of Exercise per Week: 0 days    Minutes of Exercise per Session: 0 min  Stress: Stress Concern Present (08/01/2022)   [employer] of Occupational Health - Occupational Stress Questionnaire    Feeling of Stress : To some extent  Social Connections: Socially Isolated (05/11/2022)   Social Connection and Isolation Panel [NHANES]    Frequency of Communication with Friends and Family:  More than three times a week    Frequency of Social Gatherings with Friends and Family: Twice a week    Attends Religious Services: Never    Database administrator or Organizations: No    Attends Banker Meetings: Never    Marital Status: Widowed    Objective:  There were no vitals taken for this visit.     01/08/2023   11:22 AM 09/28/2022    2:27 PM 09/13/2022    1:59 PM  BP/Weight  Systolic BP 90 122 108  Diastolic BP 50 68 58  Wt. (Lbs) 127 132 133  BMI 23.23 kg/m2 24.14 kg/m2 24.33 kg/m2    Physical Exam  Diabetic Foot Exam - Simple   No data filed      Lab Results  Component Value Date   WBC 5.4 01/08/2023   HGB 13.4 01/08/2023   HCT 39.3 01/08/2023   PLT 205 01/08/2023   GLUCOSE 85 01/08/2023   CHOL 155 01/08/2023   TRIG 71 01/08/2023   HDL 68 01/08/2023   LDLCALC 73 01/08/2023   ALT 10 01/08/2023   AST 14 01/08/2023   NA 135 01/08/2023   K 3.6 01/08/2023   CL 94 (L) 01/08/2023   CREATININE 0.67 01/08/2023   BUN 8 01/08/2023   CO2 28 01/08/2023   TSH 1.330 09/13/2022   INR 1.02 01/28/2015   HGBA1C 5.2 02/14/2022      Assessment & Plan:    Hypertension associated with diabetes [facility]) Assessment & Plan: Well controlled.  No changes to medicines. Lisinopril-Hctz 20-12.5 mg 2 tablets every morning, Clonidine 0.1 twice a day , aspirin 81 mg daily . Continue to work on eating a healthy diet and exercise.    Coronary artery disease involving native coronary artery of native heart without angina pectoris Assessment & Plan: Stable Continue crestor, aspirin, ntg.    Simple chronic bronchitis (HCC) Assessment & Plan: Well controlled.  Continue Breztri 2 puffs twice daily, Albuterol nebulizer  2.5 mg every 6 hours, albuterol inhaler 2 puffs twice a day.  Recommend cessation.    Mixed hyperlipidemia Assessment & Plan: Well managed with Crestor 10 mg daily Will order repeat lipid panel Recommend to continue low calorie diet      No  orders of the defined types were placed in this encounter.   No orders of the defined types were placed in this encounter.    Follow-up: No follow-ups on file.   I,Karishma Unrein I Leal-Borjas,acting as a scribe for Blane Ohara, MD.,have documented all relevant documentation on the behalf of Blane Ohara, MD,as directed by  Blane Ohara, MD while in the presence of Blane Ohara, MD.   An After Visit Summary was printed and given to the patient.  Blane Ohara, MD [facility] [phone]

## 2023-04-12 NOTE — Assessment & Plan Note (Signed)
Well controlled.  No changes to medicines. Lisinopril-Hctz 20-12.5 mg 2 tablets every morning, Clonidine 0.1 twice a day , aspirin 81 mg daily . Continue to work on eating a healthy diet and exercise.

## 2023-04-12 NOTE — Assessment & Plan Note (Signed)
Stable Continue crestor, aspirin, ntg.

## 2023-04-12 NOTE — Assessment & Plan Note (Signed)
Well controlled.  Continue Breztri 2 puffs twice daily, Albuterol nebulizer  2.5 mg every 6 hours, albuterol inhaler 2 puffs twice a day.  Recommend cessation.

## 2023-04-16 ENCOUNTER — Ambulatory Visit (INDEPENDENT_AMBULATORY_CARE_PROVIDER_SITE_OTHER): Payer: 59

## 2023-04-16 VITALS — BP 122/60 | HR 68 | Temp 98.4°F | Resp 16 | Ht 62.0 in | Wt 125.8 lb

## 2023-04-16 DIAGNOSIS — Z79891 Long term (current) use of opiate analgesic: Secondary | ICD-10-CM

## 2023-04-16 DIAGNOSIS — Z23 Encounter for immunization: Secondary | ICD-10-CM | POA: Diagnosis not present

## 2023-04-16 DIAGNOSIS — M150 Primary generalized (osteo)arthritis: Secondary | ICD-10-CM | POA: Diagnosis not present

## 2023-04-16 DIAGNOSIS — F322 Major depressive disorder, single episode, severe without psychotic features: Secondary | ICD-10-CM

## 2023-04-16 DIAGNOSIS — Z720 Tobacco use: Secondary | ICD-10-CM

## 2023-04-16 DIAGNOSIS — J410 Simple chronic bronchitis: Secondary | ICD-10-CM | POA: Diagnosis not present

## 2023-04-16 MED ORDER — NITROGLYCERIN 0.4 MG SL SUBL: 0.4000 mg | SUBLINGUAL_TABLET | SUBLINGUAL | 1 refills | Status: AC | PRN

## 2023-04-16 MED ORDER — ALBUTEROL SULFATE HFA 108 (90 BASE) MCG/ACT IN AERS: 2.0000 | INHALATION_SPRAY | Freq: Four times a day (QID) | RESPIRATORY_TRACT | 1 refills | Status: DC | PRN

## 2023-04-16 NOTE — Progress Notes (Signed)
Subjective:  Patient ID: [name] [name], female    DOB: Oct 18, 1945  Age: 77 y.o. MRN: [id number]  Chief Complaint  Patient presents with   Medical Management of Chronic Issues    HPI  The patient presents with chronic low back pain radiating to the right leg, a condition she has been dealing with for many years. The pain is managed with Oxycodone 10mg ; however, the patient reports that the medication is not providing sufficient relief. The patient has previously tried epidural injections, the last of which was administered three years ago, but has since discontinued this treatment. The patient also reports difficulty with balance, necessitating the use of a cane and walker for mobility. Despite these challenges, the patient remains active within her capacity, walking when possible.  The patient also suffers from COPD, requiring the use of an Albuterol inhaler twice daily, two puffs each time, and a nebulizer solution primarily before bed. She also takes [employer] every morning. The patient admits to smoking one and a half packs of cigarettes daily, a habit she attributes to anxiety and depression. The patient's anxiety has reportedly worsened since discontinuing her benzodiazepine medication due to its contraindication with her pain medication.  The patient also reports right knee pain, which has been increasing recently. She had a total knee replacement on the left knee in 2016. The patient has been trying to schedule an appointment with an orthopedic surgeon for evaluation of the right knee.  The patient has been experiencing significant emotional stress due to the illness of her oldest son and the recent loss of another son. This has reportedly led to an increase in her smoking habit. Despite these challenges, the patient's recent blood work showed good kidney and liver function, good cholesterol levels, and good blood counts. The patient also reports regular bowel movements, which are monitored  due to the constipating effects of her pain medication.      08/01/2022    3:48 PM 06/26/2022   11:09 AM 02/14/2022    2:55 PM 11/03/2021   11:29 AM 08/11/2021   11:14 AM  Depression screen PHQ 2/9  Decreased Interest 0 0 1 1 1   Down, Depressed, Hopeless 1 1 2 1 3   PHQ - 2 Score 1 1 3 2 4   Altered sleeping 3 3 2 3 3   Tired, decreased energy 3 3 3 3 3   Change in appetite 2 2 3 3 3   Feeling bad or failure about yourself  0 0 2 0 3  Trouble concentrating 2 2 2 3 3   Moving slowly or fidgety/restless 1 1 2 1 3   Suicidal thoughts 0 0 0 0 0  PHQ-9 Score 12 12 17 15 22   Difficult doing work/chores Not difficult at all Not difficult at all Somewhat difficult Not difficult at all Very difficult        10/12/2022    3:02 PM  Fall Risk   Falls in the past year? 1  Number falls in past yr: 0  Injury with Fall? 1  Risk for fall due to : History of fall(s);Impaired balance/gait;Impaired mobility;Orthopedic patient;Other (Comment)  Risk for fall due to: Comment Chronic back pain and discomfort  Follow up Falls evaluation completed;Education provided;Falls prevention discussed;Follow up appointment    Patient Care Team: Windell Moment, MD as PCP - General (Family Medicine) Thomasene Ripple, DO as PCP - Cardiology (Cardiology) Marlowe Sax, RN as Case Manager (General Practice)   Review of Systems  Constitutional: Negative.   HENT:  Negative.    Respiratory: Negative.    Gastrointestinal: Negative.   Endocrine: Negative.   Genitourinary: Negative.   Musculoskeletal:  Positive for arthralgias and back pain.  Psychiatric/Behavioral:  Positive for dysphoric mood. The patient is nervous/anxious.     Current Outpatient Medications on File Prior to Visit  Medication Sig Dispense Refill   albuterol (PROVENTIL) (2.5 MG/3ML) 0.083% nebulizer solution Take 3 mLs (2.5 mg total) by nebulization every 6 (six) hours as needed for wheezing or shortness of breath. 120 mL 6   aspirin EC 81 MG tablet Take  1 tablet (81 mg total) by mouth daily. Swallow whole. 90 tablet 3   Budeson-Glycopyrrol-Formoterol (BREZTRI AEROSPHERE) 160-9-4.8 MCG/ACT AERO INHALE 2 PUFFS BY MOUTH AND INTO THE LUNGS TWICE DAILY AS NEEDED FOR SHORTNESS OF BREATH 10.7 g 0   buPROPion (WELLBUTRIN XL) 150 MG 24 hr tablet TAKE 1 TABLET BY MOUTH EVERY MORNING 30 tablet 0   cloNIDine (CATAPRES) 0.1 MG tablet Take 1 tablet (0.1 mg total) by mouth 2 (two) times daily. 180 tablet 1   finasteride (PROPECIA) 1 MG tablet TAKE ONE TABLET BY MOUTH DAILY 30 tablet 3   hydrOXYzine (VISTARIL) 25 MG capsule Take 1 capsule (25 mg total) by mouth every 8 (eight) hours as needed. 30 capsule 0   ibuprofen (ADVIL) 800 MG tablet TAKE ONE TABLET BY MOUTH every EIGHT hours AS NEEDED FOR moderate pain 90 tablet 2   isosorbide mononitrate (IMDUR) 30 MG 24 hr tablet Take 0.5 tablets (15 mg total) by mouth daily. Patient needs appointment for further refills. 1 st attempt 15 tablet 10   lisinopril-hydrochlorothiazide (ZESTORETIC) 20-12.5 MG tablet TAKE TWO (2) TABLETS BY MOUTH EVERY MORNING 180 tablet 1   meclizine (ANTIVERT) 25 MG tablet TAKE 1 TABLET BY MOUTH THREE TIMES DAILY AS NEEDED FOR DIZZINESS *REFILL REQUEST* 180 tablet 1   omeprazole (PRILOSEC) 40 MG capsule TAKE 1 CAPSULE BY MOUTH EVERY MORNING 180 capsule 1   oxyCODONE-acetaminophen (PERCOCET) 7.5-325 MG tablet Take 1 tablet by mouth every 6 (six) hours as needed.     potassium chloride (KLOR-CON M) 10 MEQ tablet TAKE 1 TABLET BY MOUTH ONCE DAILY 90 tablet 1   promethazine (PHENERGAN) 25 MG tablet TAKE ONE TABLET BY MOUTH every SIX hours AS NEEDED FOR NAUSEA AND VOMITING 30 tablet 3   rosuvastatin (CRESTOR) 10 MG tablet TAKE 1 TABLET BY MOUTH EVERY DAY AT BEDTIME 30 tablet 2   traZODone (DESYREL) 100 MG tablet TAKE 1 TABLET BY MOUTH DAILY AT BEDTIME 90 tablet 1   Vitamin D, Ergocalciferol, (DRISDOL) 1.25 MG (50000 UNIT) CAPS capsule TAKE ONE CAPSULE BY MOUTH ONCE WEEKLY ON MONDAY 12 capsule 2    No current facility-administered medications on file prior to visit.   Past Medical History:  Diagnosis Date   Anxiety    Aortic atherosclerosis (HCC)    Ascending aorta dilation (HCC)    a.) TTE 09/02/19 --> mild; measured 39mm.   Asthma    BMI 24.0-24.9, adult 02/14/2022   CAD (coronary artery disease)    a,) CTA 08/24/20 --> mild CAD (25-49%) in RCA, LAD, LCx; CAC/Agatston score 509.   Cataract    Chronic left shoulder pain 09/27/2015   COPD (chronic obstructive pulmonary disease) (HCC)    Coronary artery disease involving native coronary artery of native heart without angina pectoris 11/18/2020   Degenerative joint disease involving multiple joints 09/28/2017   Depression    Depression, major, single episode, severe (HCC) 03/30/2020   Diastolic dysfunction  a.) TTE 09/02/19 --> LVEF 60-65%; G2DD. b.) TTE 07/23/20 --> LVEF 70-75%; G1DD.   Enlarged aorta (HCC)    Essential hypertension 08/07/2019   GERD (gastroesophageal reflux disease)    GERD with esophagitis 08/07/2019   History of kidney stones    Hyperlipidemia    Hypertension    Long-term current use of opiate analgesic 06/25/2017   Lupus    Myocardial infarction (HCC)    Osteoarthritis    PAC (premature atrial contraction)    a.) Holter 12/04/19 --> occassional; 1.1% PAC burden.   Pain of right hip joint 11/22/2015   Paraesophageal hernia    PAT (paroxysmal atrial tachycardia) (HCC) 02/03/2020   Presbyesophagus    Primary osteoarthritis of left knee 02/04/2015   PSVT (paroxysmal supraventricular tachycardia) (HCC)    a.) Holter 12/04/19 --> 22 runs.   Renal calculus 12/21/2021   S/P repair of paraesophageal hernia 02/22/2021   S/P total knee arthroplasty 02/15/2015   Schatzki's ring    Sciatica 01/31/2022   Tobacco use 08/19/2019   Type 2 diabetes mellitus with other specified complication (HCC) 08/07/2019   Past Surgical History:  Procedure Laterality Date   ABDOMINAL HYSTERECTOMY     BLADDER SUSPENSION     BREAST  BIOPSY Right    COLONOSCOPY  09/04/2011   Colonic polyps, status post polyectomy. Incidental small ascending colon lipoma   ESOPHAGOGASTRODUODENOSCOPY  09/08/2015   Schatzki ring status post esophageal dilitation. Small hiatal hernia   hemorrhoid surgery     KNEE SURGERY     left   TOTAL KNEE ARTHROPLASTY Left 02/15/2015   Procedure: TOTAL KNEE ARTHROPLASTY;  Surgeon: Dannielle Huh, MD;  Location: [facility] OR;  Service: Orthopedics;  Laterality: Left;   TUBAL LIGATION     WRIST SURGERY     right   XI ROBOTIC ASSISTED PARAESOPHAGEAL HERNIA REPAIR N/A 02/22/2021   Procedure: XI ROBOTIC ASSISTED PARAESOPHAGEAL HERNIA REPAIR with RNFA to assist;  Surgeon: Leafy Ro, MD;  Location: [facility] ORS;  Service: General;  Laterality: N/A;    Family History  Problem Relation Age of Onset   Stroke Mother    Heart disease Mother    COPD Father    Cancer Father        Bone   Diabetes Father    Heart disease Father    Stroke Father    Lupus Sister    Seizures Son    COPD Son    Colon cancer Neg Hx    Esophageal cancer Neg Hx    Stomach cancer Neg Hx    Rectal cancer Neg Hx    Social History   Socioeconomic History   Marital status: Widowed    Spouse name: Not on file   Number of children: 4   Years of education: Not on file   Highest education level: Not on file  Occupational History   Occupation: Retired  Tobacco Use   Smoking status: Every Day    Current packs/day: 1.00    Average packs/day: 1 pack/day for 48.0 years (48.0 ttl pk-yrs)    Types: Cigarettes   Smokeless tobacco: Never  Vaping Use   Vaping status: Never Used  Substance and Sexual Activity   Alcohol use: No   Drug use: No   Sexual activity: Not Currently  Other Topics Concern   Not on file  Social History Narrative   Not on file   Social Determinants of Health   Financial Resource Strain: Low Risk  (05/11/2022)   Overall Financial Resource  Strain (CARDIA)    Difficulty of Paying Living Expenses: Not hard at all   Food Insecurity: No Food Insecurity (08/01/2022)   Hunger Vital Sign    Worried About Running Out of Food in the Last Year: Never true    Ran Out of Food in the Last Year: Never true  Transportation Needs: Unmet Transportation Needs (08/01/2022)   PRAPARE - Transportation    Lack of Transportation (Medical): Yes    Lack of Transportation (Non-Medical): Yes  Physical Activity: Inactive (08/01/2022)   Exercise Vital Sign    Days of Exercise per Week: 0 days    Minutes of Exercise per Session: 0 min  Stress: Stress Concern Present (08/01/2022)   [employer] of Occupational Health - Occupational Stress Questionnaire    Feeling of Stress : To some extent  Social Connections: Socially Isolated (05/11/2022)   Social Connection and Isolation Panel [NHANES]    Frequency of Communication with Friends and Family: More than three times a week    Frequency of Social Gatherings with Friends and Family: Twice a week    Attends Religious Services: Never    Database administrator or Organizations: No    Attends Banker Meetings: Never    Marital Status: Widowed    Objective:  BP 122/60 (BP Location: Right Arm, Patient Position: Sitting, Cuff Size: Normal)   Pulse 68   Temp 98.4 F (36.9 C) (Temporal)   Resp 16   Ht 5\' 2"  (1.575 m)   Wt 125 lb 12.8 oz (57.1 kg)   SpO2 98%   BMI 23.01 kg/m      04/16/2023    1:26 PM 01/08/2023   11:22 AM 09/28/2022    2:27 PM  BP/Weight  Systolic BP 122 90 122  Diastolic BP 60 50 68  Wt. (Lbs) 125.8 127 132  BMI 23.01 kg/m2 23.23 kg/m2 24.14 kg/m2    Physical Exam Vitals and nursing note reviewed.  Constitutional:      Appearance: Normal appearance.  HENT:     Head: Normocephalic and atraumatic.     Mouth/Throat:     Comments: dentures Cardiovascular:     Rate and Rhythm: Normal rate and regular rhythm.  Pulmonary:     Effort: Pulmonary effort is normal.     Breath sounds: Normal breath sounds.  Musculoskeletal:     Cervical  back: Normal range of motion.     Comments: MUSCULOSKELETAL: Kyphosis observed. Right paralumbar tenderness.  Neurological:     General: No focal deficit present.     Mental Status: She is alert.  Psychiatric:        Mood and Affect: Mood normal.     Diabetic Foot Exam - Simple   No data filed      Lab Results  Component Value Date   WBC 5.4 01/08/2023   HGB 13.4 01/08/2023   HCT 39.3 01/08/2023   PLT 205 01/08/2023   GLUCOSE 85 01/08/2023   CHOL 155 01/08/2023   TRIG 71 01/08/2023   HDL 68 01/08/2023   LDLCALC 73 01/08/2023   ALT 10 01/08/2023   AST 14 01/08/2023   NA 135 01/08/2023   K 3.6 01/08/2023   CL 94 (L) 01/08/2023   CREATININE 0.67 01/08/2023   BUN 8 01/08/2023   CO2 28 01/08/2023   TSH 1.330 09/13/2022   INR 1.02 01/28/2015   HGBA1C 5.2 02/14/2022      Assessment & Plan:    Primary osteoarthritis involving multiple joints Assessment &  Plan: Chronic low back pain radiating to the right leg, ongoing for many years. Limited relief from oxycodone and epidural injections. Balance issues present, uses cane and walker. Discussed opioid dependency risks and limited efficacy of epidural injections. Encouraged non-pharmacological approaches. - Continue oxycodone 10 mg - Consult pain management specialist on December 5 - Encourage stretching and walking as tolerated   Long-term current use of opiate analgesic Assessment & Plan: Gets oxycodone via pain clinic Does not abuse it or overuse it    Simple chronic bronchitis (HCC) Assessment & Plan: COPD managed with albuterol inhaler (2 puffs twice daily) and Breztri Aerosphere (once daily). Increased smoking due to anxiety and depression exacerbates condition. Discussed smoking cessation importance. - Refill albuterol inhaler - Encourage smoking cessation - Discuss anxiety management options   Tobacco use Assessment & Plan: Recommend to cut back on tobacco use and the number of cigarettes.   General  Health Maintenance Routine health maintenance discussed. Recent blood work in August showed normal kidney, liver, cholesterol, and blood sugar levels. Due for flu shot. - Administer flu shot today - Schedule follow-up in March for chronic disease management and repeat blood work  Follow-up - Schedule follow-up in March for chronic disease management and repeat blood work.   Encounter for immunization -     Flu Vaccine Trivalent High Dose (Fluad)  Depression, major, single episode, severe (HCC) Assessment & Plan: Increased anxiety and depression due to cessation of benzodiazepines. Increased smoking as a coping mechanism. Discussed risks of combining benzodiazepines with opioids and need for alternative strategies. - Encourage smoking cessation - Discuss alternative anxiety management strategies - continue wellbutrin XL 150 mg daily, hydroxyzine as needed for anxiety, and trazodone for insomnia - Consider mental health specialist referral if symptoms persist    Other orders -     Nitroglycerin; Place 1 tablet (0.4 mg total) under the tongue every 5 (five) minutes as needed for chest pain.  Dispense: 30 tablet; Refill: 1 -     Albuterol Sulfate HFA; Inhale 2 puffs into the lungs every 6 (six) hours as needed for wheezing or shortness of breath.  Dispense: 8 g; Refill: 1     Meds ordered this encounter  Medications   nitroGLYCERIN (NITROSTAT) 0.4 MG SL tablet    Sig: Place 1 tablet (0.4 mg total) under the tongue every 5 (five) minutes as needed for chest pain.    Dispense:  30 tablet    Refill:  1   albuterol (VENTOLIN HFA) 108 (90 Base) MCG/ACT inhaler    Sig: Inhale 2 puffs into the lungs every 6 (six) hours as needed for wheezing or shortness of breath.    Dispense:  8 g    Refill:  1    Orders Placed This Encounter  Procedures   Flu Vaccine Trivalent High Dose (Fluad)     Follow-up: Return in about 4 months (around 08/14/2023) for chronic disease follow up.   I,Angela  Taylor,acting as a Neurosurgeon for [employer], MD.,have documented all relevant documentation on the behalf of Windell Moment, MD,as directed by  Windell Moment, MD while in the presence of Windell Moment, MD.   An After Visit Summary was printed and given to the patient.  Windell Moment, MD [facility] [phone]

## 2023-04-16 NOTE — Assessment & Plan Note (Signed)
Increased anxiety and depression due to cessation of benzodiazepines. Increased smoking as a coping mechanism. Discussed risks of combining benzodiazepines with opioids and need for alternative strategies. - Encourage smoking cessation - Discuss alternative anxiety management strategies - continue wellbutrin XL 150 mg daily, hydroxyzine as needed for anxiety, and trazodone for insomnia - Consider mental health specialist referral if symptoms persist

## 2023-04-16 NOTE — Assessment & Plan Note (Signed)
COPD managed with albuterol inhaler (2 puffs twice daily) and Breztri Aerosphere (once daily). Increased smoking due to anxiety and depression exacerbates condition. Discussed smoking cessation importance. - Refill albuterol inhaler - Encourage smoking cessation - Discuss anxiety management options

## 2023-04-16 NOTE — Patient Instructions (Signed)
Refilled your nitroglycerin pills Refilled your albuterol inhaler No blood work today Try to smoke less Flu shot today Return in March 2025

## 2023-04-16 NOTE — Assessment & Plan Note (Signed)
Gets oxycodone via pain clinic Does not abuse it or overuse it

## 2023-04-16 NOTE — Assessment & Plan Note (Signed)
Chronic low back pain radiating to the right leg, ongoing for many years. Limited relief from oxycodone and epidural injections. Balance issues present, uses cane and walker. Discussed opioid dependency risks and limited efficacy of epidural injections. Encouraged non-pharmacological approaches. - Continue oxycodone 10 mg - Consult pain management specialist on December 5 - Encourage stretching and walking as tolerated

## 2023-04-16 NOTE — Assessment & Plan Note (Signed)
Recommend to cut back on tobacco use and the number of cigarettes.   General Health Maintenance Routine health maintenance discussed. Recent blood work in August showed normal kidney, liver, cholesterol, and blood sugar levels. Due for flu shot. - Administer flu shot today - Schedule follow-up in March for chronic disease management and repeat blood work  Follow-up - Schedule follow-up in March for chronic disease management and repeat blood work.

## 2023-04-23 ENCOUNTER — Ambulatory Visit: Payer: Self-pay

## 2023-04-23 NOTE — Telephone Encounter (Signed)
I called patient x2 and send me to voicemail. Voicemail is full

## 2023-04-23 NOTE — Telephone Encounter (Signed)
Copied from CRM [id number]. Topic: Clinical - Red Word Triage >> Apr 23, 2023  3:02 PM [name] [name] wrote: Red Word that prompted transfer to Nurse Triage: recieved a flu shot on 04/16/23 next day feeling bad coughing sneezing , chest hurting , when walk feel like get out of breath even when talking on phone out of breath  Chief Complaint: Shortness of breath Symptoms: Short of breath, coughing, runny nose, sneezing Frequency: Pt states started after receiving a flu shot on 11/18. (One week ago) Pertinent Negatives: Patient denies fever Disposition: [] ED /[] Urgent Care (no appt availability in office) / [x] Appointment(In office/virtual)/ []  [facility]/ [] Home Care/ [] Refused Recommended Disposition /[] [facility]/ []  Follow-up with PCP Additional Notes: Patient states that she received the flu shot on 11/18 and has been generally unwell ever since (one week ago)  She states she had a fever the night of the 18th of 101 but hasn't had a fever since then and doesn't have a fever now.  She states she is coughing and has some slight mucous and it is clear.  She has also been using cough drops and cough medication.  She used her nebulizer this morning.  This RN recommended to the patient that based on her medical history for her to go to the ER for evaluation at this time or an urgent care at least.  Patient advised that her neighbor wasn't home who would have to drive her and she wanted to wait until tomorrow morning after 11am to come be seen at the office.  Patient stated that she did not want to go to the ER but if she got worse she would do so.  She also was advised that if she felt worse and her neighbor was not available for her to call 911.  She verbalized understanding of this.  No audible wheezing noted over the phone.  Pt states that she is staying hydrated and that she drinks plenty of water.  Appointment was made for tomorrow 04/24/23 at 2:00PM.  Patient is reminded to stay  hydrated and if anything changed at all to get medical help immediately by driving to the ER or urgent care or calling 911.  Patient verbalized understanding.  Reason for Disposition  [1] MILD difficulty breathing (e.g., minimal/no SOB at rest, SOB with walking, pulse <100) AND [2] NEW-onset or WORSE than normal  Answer Assessment - Initial Assessment Questions 1. RESPIRATORY STATUS: "Describe your breathing?" (e.g., wheezing, shortness of breath, unable to speak, severe coughing)      Short of breath with regular activity 2. ONSET: "When did this breathing problem begin?"      "It started 11:30pm Tuesday night" Pt received flu shot on 11/18 and started feeling bad afterwards, along with sneezing/coughing with some clear mucous, fever of 101 monday night but no fever now. 3. PATTERN "Does the difficult breathing come and go, or has it been constant since it started?"      Comes and goes--pt states she has used her inhaler and she was supposed to get a refill on some nebulizer medication 4. SEVERITY: "How bad is your breathing?" (e.g., mild, moderate, severe)    - MILD: No SOB at rest, mild SOB with walking, speaks normally in sentences, can lie down, no retractions, pulse < 100.    - MODERATE: SOB at rest, SOB with minimal exertion and prefers to sit, cannot lie down flat, speaks in phrases, mild retractions, audible wheezing, pulse 100-120.    - SEVERE:  Very SOB at rest, speaks in single words, struggling to breathe, sitting hunched forward, retractions, pulse > 120      Pt states she is getting out of breath walking around the house 5. RECURRENT SYMPTOM: "Have you had difficulty breathing before?" If Yes, ask: "When was the last time?" and "What happened that time?"      Unknown 6. CARDIAC HISTORY: "Do you have any history of heart disease?" (e.g., heart attack, angina, bypass surgery, angioplasty)      Pt states she is prescribed nitroglycerin 7. LUNG HISTORY: "Do you have any history of lung  disease?"  (e.g., pulmonary embolus, asthma, emphysema)     Asthma 8. CAUSE: "What do you think is causing the breathing problem?"      Pt unsure 9. OTHER SYMPTOMS: "Do you have any other symptoms? (e.g., dizziness, runny nose, cough, chest pain, fever)     Runny nose, chest pain with coughing 10. O2 SATURATION MONITOR:  "Do you use an oxygen saturation monitor (pulse oximeter) at home?" If Yes, ask: "What is your reading (oxygen level) today?" "What is your usual oxygen saturation reading?" (e.g., 95%)       Pt doesn't have one 12. TRAVEL: "Have you traveled out of the country in the last month?" (e.g., travel history, exposures)       Pt denies  Protocols used: Breathing Difficulty-A-AH

## 2023-04-24 ENCOUNTER — Ambulatory Visit: Payer: 59

## 2023-04-24 VITALS — BP 120/68 | HR 60 | Temp 97.8°F | Resp 16 | Ht 62.0 in | Wt 122.4 lb

## 2023-04-24 DIAGNOSIS — J42 Unspecified chronic bronchitis: Secondary | ICD-10-CM | POA: Diagnosis not present

## 2023-04-24 DIAGNOSIS — G4700 Insomnia, unspecified: Secondary | ICD-10-CM

## 2023-04-24 DIAGNOSIS — R051 Acute cough: Secondary | ICD-10-CM

## 2023-04-24 HISTORY — DX: Insomnia, unspecified: G47.00

## 2023-04-24 HISTORY — DX: Acute cough: R05.1

## 2023-04-24 MED ORDER — PREDNISONE 20 MG PO TABS: 40.0000 mg | ORAL_TABLET | Freq: Every day | ORAL | 0 refills | Status: AC

## 2023-04-24 NOTE — Assessment & Plan Note (Signed)
Likely sec to COPD exacerbation. Plan as as above.,

## 2023-04-24 NOTE — Patient Instructions (Signed)
I sent prednisone to your pharmacy to help with your breathing and the cough Continue everything else Report back if any worsening symptoms or call 911 for any severe symptoms  Okay to take 1 and half tabs of the trazodone

## 2023-04-24 NOTE — Assessment & Plan Note (Signed)
COPD Exacerbation Increased cough, dyspnea, and phlegm production for eight days post-flu shot. Symptoms include scratchy throat, clear nasal drainage, and clear phlegm. COPD with increased inhaler use. Wheezing bilaterally on exam. Likely viral etiology. Previously responsive to prednisone. Discussed prednisone risks (e.g., hyperglycemia, infection). Patient prefers minimal medication. - Prescribe prednisone 20 mg, 2 tablets daily for 7 days - Continue current inhalers - Continue OTC cough syrup - Advise seeking emergency care for severe dyspnea - Follow up if symptoms persist or worsen

## 2023-04-24 NOTE — Progress Notes (Signed)
Acute Office Visit  Subjective:    Patient ID: [name] [name], female    DOB: 1945-12-05, 77 y.o.   MRN: [id number]  Chief Complaint  Patient presents with   Cough   Chills   Shortness of Breath    HPI:  The patient, with a history of chronic obstructive pulmonary disease (COPD), presented with a worsening cough and shortness of breath. She reported that these symptoms began approximately eight days ago, following a flu shot. The patient described experiencing chills and a transient fever on the night of the vaccination, which resolved without intervention. However, she subsequently developed a runny nose, sneezing, and a general feeling of malaise. The patient's cough has been persistent and severe, leading to fatigue. She has been managing the cough with over-the-counter cough syrup, cough drops, tea, and honey, but with limited relief.  The patient also reported increased use of her inhalers. She described her throat as scratchy and irritated, but not severely painful. She noted a slight chest discomfort, which she attributed to the persistent cough. The patient also reported difficulty sleeping, despite taking Trazodone 100mg  for insomnia. She described waking up early in the morning with severe coughing episodes, which are exacerbated by physical activity.  The patient has a history of bronchitis every January and had a mild case of pneumonia seven years ago, which resolved with medication. She also reported chronic back pain, for which she takes Percocet 10mg  four times a day. The patient noted that her sputum is clear and her nasal discharge is white. She denied any ear discomfort.  The patient also mentioned that she has not yet received her emergency inhaler (Albuterol) from the pharmacy. She expressed concern about her current symptoms and her desire to feel better.  Past Medical History:  Diagnosis Date   Anxiety    Aortic atherosclerosis (HCC)    Ascending aorta dilation (HCC)     a.) TTE 09/02/19 --> mild; measured 39mm.   Asthma    BMI 24.0-24.9, adult 02/14/2022   CAD (coronary artery disease)    a,) CTA 08/24/20 --> mild CAD (25-49%) in RCA, LAD, LCx; CAC/Agatston score 509.   Cataract    Chronic left shoulder pain 09/27/2015   COPD (chronic obstructive pulmonary disease) (HCC)    Coronary artery disease involving native coronary artery of native heart without angina pectoris 11/18/2020   Degenerative joint disease involving multiple joints 09/28/2017   Depression    Depression, major, single episode, severe (HCC) 03/30/2020   Diastolic dysfunction    a.) TTE 09/02/19 --> LVEF 60-65%; G2DD. b.) TTE 07/23/20 --> LVEF 70-75%; G1DD.   Enlarged aorta (HCC)    Essential hypertension 08/07/2019   GERD (gastroesophageal reflux disease)    GERD with esophagitis 08/07/2019   History of kidney stones    Hyperlipidemia    Hypertension    Long-term current use of opiate analgesic 06/25/2017   Lupus    Myocardial infarction (HCC)    Osteoarthritis    PAC (premature atrial contraction)    a.) Holter 12/04/19 --> occassional; 1.1% PAC burden.   Pain of right hip joint 11/22/2015   Paraesophageal hernia    PAT (paroxysmal atrial tachycardia) (HCC) 02/03/2020   Presbyesophagus    Primary osteoarthritis of left knee 02/04/2015   PSVT (paroxysmal supraventricular tachycardia) (HCC)    a.) Holter 12/04/19 --> 22 runs.   Renal calculus 12/21/2021   S/P repair of paraesophageal hernia 02/22/2021   S/P total knee arthroplasty 02/15/2015   Schatzki's ring  Sciatica 01/31/2022   Tobacco use 08/19/2019   Type 2 diabetes mellitus with other specified complication (HCC) 08/07/2019    Past Surgical History:  Procedure Laterality Date   ABDOMINAL HYSTERECTOMY     BLADDER SUSPENSION     BREAST BIOPSY Right    COLONOSCOPY  09/04/2011   Colonic polyps, status post polyectomy. Incidental small ascending colon lipoma   ESOPHAGOGASTRODUODENOSCOPY  09/08/2015   Schatzki ring status post  esophageal dilitation. Small hiatal hernia   hemorrhoid surgery     KNEE SURGERY     left   TOTAL KNEE ARTHROPLASTY Left 02/15/2015   Procedure: TOTAL KNEE ARTHROPLASTY;  Surgeon: Dannielle Huh, MD;  Location: [facility] OR;  Service: Orthopedics;  Laterality: Left;   TUBAL LIGATION     WRIST SURGERY     right   XI ROBOTIC ASSISTED PARAESOPHAGEAL HERNIA REPAIR N/A 02/22/2021   Procedure: XI ROBOTIC ASSISTED PARAESOPHAGEAL HERNIA REPAIR with RNFA to assist;  Surgeon: Leafy Ro, MD;  Location: [facility] ORS;  Service: General;  Laterality: N/A;    Family History  Problem Relation Age of Onset   Stroke Mother    Heart disease Mother    COPD Father    Cancer Father        Bone   Diabetes Father    Heart disease Father    Stroke Father    Lupus Sister    Seizures Son    COPD Son    Colon cancer Neg Hx    Esophageal cancer Neg Hx    Stomach cancer Neg Hx    Rectal cancer Neg Hx     Social History   Socioeconomic History   Marital status: Widowed    Spouse name: Not on file   Number of children: 4   Years of education: Not on file   Highest education level: Not on file  Occupational History   Occupation: Retired  Tobacco Use   Smoking status: Every Day    Current packs/day: 1.00    Average packs/day: 1 pack/day for 48.0 years (48.0 ttl pk-yrs)    Types: Cigarettes   Smokeless tobacco: Never  Vaping Use   Vaping status: Never Used  Substance and Sexual Activity   Alcohol use: No   Drug use: No   Sexual activity: Not Currently  Other Topics Concern   Not on file  Social History Narrative   Not on file   Social Determinants of Health   Financial Resource Strain: Low Risk  (05/11/2022)   Overall Financial Resource Strain (CARDIA)    Difficulty of Paying Living Expenses: Not hard at all  Food Insecurity: No Food Insecurity (08/01/2022)   Hunger Vital Sign    Worried About Running Out of Food in the Last Year: Never true    Ran Out of Food in the Last Year: Never true   Transportation Needs: Unmet Transportation Needs (08/01/2022)   PRAPARE - Transportation    Lack of Transportation (Medical): Yes    Lack of Transportation (Non-Medical): Yes  Physical Activity: Inactive (08/01/2022)   Exercise Vital Sign    Days of Exercise per Week: 0 days    Minutes of Exercise per Session: 0 min  Stress: Stress Concern Present (08/01/2022)   [employer] of Occupational Health - Occupational Stress Questionnaire    Feeling of Stress : To some extent  Social Connections: Socially Isolated (05/11/2022)   Social Connection and Isolation Panel [NHANES]    Frequency of Communication with Friends and Family: More than three  times a week    Frequency of Social Gatherings with Friends and Family: Twice a week    Attends Religious Services: Never    Database administrator or Organizations: No    Attends Banker Meetings: Never    Marital Status: Widowed  Intimate Partner Violence: Not At Risk (08/01/2022)   Humiliation, Afraid, Rape, and Kick questionnaire    Fear of Current or Ex-Partner: No    Emotionally Abused: No    Physically Abused: No    Sexually Abused: No    Outpatient Medications Prior to Visit  Medication Sig Dispense Refill   albuterol (PROVENTIL) (2.5 MG/3ML) 0.083% nebulizer solution Take 3 mLs (2.5 mg total) by nebulization every 6 (six) hours as needed for wheezing or shortness of breath. 120 mL 6   albuterol (VENTOLIN HFA) 108 (90 Base) MCG/ACT inhaler Inhale 2 puffs into the lungs every 6 (six) hours as needed for wheezing or shortness of breath. 8 g 1   aspirin EC 81 MG tablet Take 1 tablet (81 mg total) by mouth daily. Swallow whole. 90 tablet 3   Budeson-Glycopyrrol-Formoterol (BREZTRI AEROSPHERE) 160-9-4.8 MCG/ACT AERO INHALE 2 PUFFS BY MOUTH AND INTO THE LUNGS TWICE DAILY AS NEEDED FOR SHORTNESS OF BREATH 10.7 g 0   cloNIDine (CATAPRES) 0.1 MG tablet Take 1 tablet (0.1 mg total) by mouth 2 (two) times daily. 180 tablet 1   finasteride  (PROPECIA) 1 MG tablet TAKE ONE TABLET BY MOUTH DAILY 30 tablet 3   hydrOXYzine (VISTARIL) 25 MG capsule Take 1 capsule (25 mg total) by mouth every 8 (eight) hours as needed. 30 capsule 0   isosorbide mononitrate (IMDUR) 30 MG 24 hr tablet Take 0.5 tablets (15 mg total) by mouth daily. Patient needs appointment for further refills. 1 st attempt 15 tablet 10   lisinopril-hydrochlorothiazide (ZESTORETIC) 20-12.5 MG tablet TAKE TWO (2) TABLETS BY MOUTH EVERY MORNING 180 tablet 1   meclizine (ANTIVERT) 25 MG tablet TAKE 1 TABLET BY MOUTH THREE TIMES DAILY AS NEEDED FOR DIZZINESS *REFILL REQUEST* 180 tablet 1   nitroGLYCERIN (NITROSTAT) 0.4 MG SL tablet Place 1 tablet (0.4 mg total) under the tongue every 5 (five) minutes as needed for chest pain. 30 tablet 1   omeprazole (PRILOSEC) 40 MG capsule TAKE 1 CAPSULE BY MOUTH EVERY MORNING 180 capsule 1   oxyCODONE-acetaminophen (PERCOCET) 7.5-325 MG tablet Take 1 tablet by mouth every 6 (six) hours as needed.     potassium chloride (KLOR-CON M) 10 MEQ tablet TAKE 1 TABLET BY MOUTH ONCE DAILY 90 tablet 1   promethazine (PHENERGAN) 25 MG tablet TAKE ONE TABLET BY MOUTH every SIX hours AS NEEDED FOR NAUSEA AND VOMITING 30 tablet 3   rosuvastatin (CRESTOR) 10 MG tablet TAKE 1 TABLET BY MOUTH EVERY DAY AT BEDTIME 30 tablet 2   traZODone (DESYREL) 100 MG tablet TAKE 1 TABLET BY MOUTH DAILY AT BEDTIME 90 tablet 1   Vitamin D, Ergocalciferol, (DRISDOL) 1.25 MG (50000 UNIT) CAPS capsule TAKE ONE CAPSULE BY MOUTH ONCE WEEKLY ON MONDAY 12 capsule 2   ibuprofen (ADVIL) 800 MG tablet TAKE ONE TABLET BY MOUTH every EIGHT hours AS NEEDED FOR moderate pain 90 tablet 2   buPROPion (WELLBUTRIN XL) 150 MG 24 hr tablet TAKE 1 TABLET BY MOUTH EVERY MORNING 30 tablet 0   No facility-administered medications prior to visit.    Allergies  Allergen Reactions   Meperidine Anaphylaxis and Other (See Comments)    Blood pressure dropped, also   Meperidine Hcl  Anaphylaxis and Other  (See Comments)    B/P dropped, also   Gabapentin     Vomiting    Influenza Vaccines Other (See Comments)    Flu-like symptoms   Other Rash and Other (See Comments)    asparagus    Review of Systems  Constitutional: Negative.   HENT:  Positive for postnasal drip.   Eyes: Negative.   Respiratory:  Positive for cough and shortness of breath.   Cardiovascular: Negative.   Gastrointestinal: Negative.   Genitourinary: Negative.   Musculoskeletal: Negative.   Psychiatric/Behavioral:  Positive for sleep disturbance.        Objective:        04/24/2023    2:02 PM 04/16/2023    1:26 PM 01/08/2023   11:22 AM  Vitals with BMI  Height 5\' 2"  5\' 2"  5\' 2"   Weight 122 lbs 6 oz 125 lbs 13 oz 127 lbs  BMI 22.38 23 23.22  Systolic 120 122 90  Diastolic 68 60 50  Pulse 60 68 59    Orthostatic VS for the past 72 hrs (Last 3 readings):  Patient Position BP Location Cuff Size  04/24/23 1402 Sitting Left Arm Normal     Physical Exam Vitals reviewed.  Constitutional:      Appearance: She is well-developed.  HENT:     Head: Normocephalic and atraumatic.  Cardiovascular:     Rate and Rhythm: Normal rate and regular rhythm.  Pulmonary:     Effort: Pulmonary effort is normal.     Comments: Occasional wheezing bilaterally Musculoskeletal:     Comments: Knock knees noted  Neurological:     General: No focal deficit present.     Mental Status: She is alert.     Health Maintenance Due  Topic Date Due   OPHTHALMOLOGY EXAM  Never done   Hepatitis C Screening  Never done   DTaP/Tdap/Td (1 - Tdap) Never done   Zoster Vaccines- Shingrix (1 of 2) Never done   FOOT EXAM  05/05/2022   HEMOGLOBIN A1C  08/15/2022   Diabetic kidney evaluation - Urine ACR  10/13/2022    There are no preventive care reminders to display for this patient.   Lab Results  Component Value Date   TSH 1.330 09/13/2022   Lab Results  Component Value Date   WBC 5.4 01/08/2023   HGB 13.4 01/08/2023    HCT 39.3 01/08/2023   MCV 94 01/08/2023   PLT 205 01/08/2023   Lab Results  Component Value Date   NA 135 01/08/2023   K 3.6 01/08/2023   CO2 28 01/08/2023   GLUCOSE 85 01/08/2023   BUN 8 01/08/2023   CREATININE 0.67 01/08/2023   BILITOT 0.5 01/08/2023   ALKPHOS 96 01/08/2023   AST 14 01/08/2023   ALT 10 01/08/2023   PROT 6.1 01/08/2023   ALBUMIN 4.1 01/08/2023   CALCIUM 9.8 01/08/2023   ANIONGAP 6 10/15/2021   EGFR 90 01/08/2023   Lab Results  Component Value Date   CHOL 155 01/08/2023   Lab Results  Component Value Date   HDL 68 01/08/2023   Lab Results  Component Value Date   LDLCALC 73 01/08/2023   Lab Results  Component Value Date   TRIG 71 01/08/2023   Lab Results  Component Value Date   CHOLHDL 2.3 01/08/2023   Lab Results  Component Value Date   HGBA1C 5.2 02/14/2022       Assessment & Plan:  Chronic bronchitis, unspecified chronic bronchitis type (HCC)  Assessment & Plan: COPD Exacerbation Increased cough, dyspnea, and phlegm production for eight days post-flu shot. Symptoms include scratchy throat, clear nasal drainage, and clear phlegm. COPD with increased inhaler use. Wheezing bilaterally on exam. Likely viral etiology. Previously responsive to prednisone. Discussed prednisone risks (e.g., hyperglycemia, infection). Patient prefers minimal medication. - Prescribe prednisone 20 mg, 2 tablets daily for 7 days - Continue current inhalers - Continue OTC cough syrup - Advise seeking emergency care for severe dyspnea - Follow up if symptoms persist or worsen   Acute cough Assessment & Plan: Likely sec to COPD exacerbation. Plan as as above.,    Insomnia, unspecified type Assessment & Plan: Difficulty sleeping despite trazodone 100 mg. Chronic insomnia, currently sleeping a few hours per night. Discussed increasing trazodone to 150 mg. Patient prefers no new medications due to Percocet use. - Advise taking 1.5 tablets of trazodone to improve  sleep    Other orders -     predniSONE; Take 2 tablets (40 mg total) by mouth daily with breakfast for 7 days.  Dispense: 14 tablet; Refill: 0     Meds ordered this encounter  Medications   predniSONE (DELTASONE) 20 MG tablet    Sig: Take 2 tablets (40 mg total) by mouth daily with breakfast for 7 days.    Dispense:  14 tablet    Refill:  0    No orders of the defined types were placed in this encounter.    Follow-up: Return if symptoms worsen or fail to improve.  An After Visit Summary was printed and given to the patient.  Windell Moment, MD [facility] [phone]

## 2023-04-24 NOTE — Assessment & Plan Note (Signed)
Difficulty sleeping despite trazodone 100 mg. Chronic insomnia, currently sleeping a few hours per night. Discussed increasing trazodone to 150 mg. Patient prefers no new medications due to Percocet use. - Advise taking 1.5 tablets of trazodone to improve sleep

## 2023-04-30 ENCOUNTER — Other Ambulatory Visit: Payer: Self-pay | Admitting: Family Medicine

## 2023-04-30 DIAGNOSIS — F339 Major depressive disorder, recurrent, unspecified: Secondary | ICD-10-CM

## 2023-05-02 ENCOUNTER — Telehealth: Payer: Self-pay | Admitting: *Deleted

## 2023-05-02 ENCOUNTER — Other Ambulatory Visit: Payer: Self-pay | Admitting: *Deleted

## 2023-05-02 NOTE — Patient Outreach (Signed)
  Care Management   Follow Up Note   05/02/2023 Name: [name] [name] MRN: [id number] DOB: December 08, 1945   Referred by: Windell Moment, MD Reason for referral : Care Management (RNCM: Attempt to follow up for chronic disease management & care coordination needs)   An unsuccessful telephone outreach was attempted today. The patient was referred to the case management team for assistance with care management and care coordination.   Follow Up Plan: The care management team will reach out to the patient again over the next 30 days.   Danise Edge, BSN RN RN Care Manager  [place]  [facility]  Direct Number: [phone]

## 2023-05-09 ENCOUNTER — Other Ambulatory Visit: Payer: Self-pay

## 2023-05-10 DIAGNOSIS — M47816 Spondylosis without myelopathy or radiculopathy, lumbar region: Secondary | ICD-10-CM | POA: Diagnosis not present

## 2023-05-10 DIAGNOSIS — M51369 Other intervertebral disc degeneration, lumbar region without mention of lumbar back pain or lower extremity pain: Secondary | ICD-10-CM | POA: Diagnosis not present

## 2023-05-10 DIAGNOSIS — M545 Low back pain, unspecified: Secondary | ICD-10-CM | POA: Diagnosis not present

## 2023-05-10 DIAGNOSIS — G894 Chronic pain syndrome: Secondary | ICD-10-CM | POA: Diagnosis not present

## 2023-05-10 DIAGNOSIS — M48061 Spinal stenosis, lumbar region without neurogenic claudication: Secondary | ICD-10-CM | POA: Diagnosis not present

## 2023-05-10 DIAGNOSIS — Z79891 Long term (current) use of opiate analgesic: Secondary | ICD-10-CM | POA: Diagnosis not present

## 2023-05-29 ENCOUNTER — Other Ambulatory Visit: Payer: Self-pay | Admitting: Family Medicine

## 2023-05-31 ENCOUNTER — Other Ambulatory Visit: Payer: Self-pay

## 2023-06-06 ENCOUNTER — Other Ambulatory Visit: Payer: Self-pay

## 2023-06-07 DIAGNOSIS — M47816 Spondylosis without myelopathy or radiculopathy, lumbar region: Secondary | ICD-10-CM | POA: Diagnosis not present

## 2023-06-07 DIAGNOSIS — G894 Chronic pain syndrome: Secondary | ICD-10-CM | POA: Diagnosis not present

## 2023-06-07 DIAGNOSIS — Z1389 Encounter for screening for other disorder: Secondary | ICD-10-CM | POA: Diagnosis not present

## 2023-06-07 DIAGNOSIS — M545 Low back pain, unspecified: Secondary | ICD-10-CM | POA: Diagnosis not present

## 2023-06-07 DIAGNOSIS — M51369 Other intervertebral disc degeneration, lumbar region without mention of lumbar back pain or lower extremity pain: Secondary | ICD-10-CM | POA: Diagnosis not present

## 2023-06-07 DIAGNOSIS — M48061 Spinal stenosis, lumbar region without neurogenic claudication: Secondary | ICD-10-CM | POA: Diagnosis not present

## 2023-06-13 ENCOUNTER — Other Ambulatory Visit: Payer: 59 | Admitting: *Deleted

## 2023-06-13 ENCOUNTER — Telehealth: Payer: Self-pay | Admitting: *Deleted

## 2023-06-13 NOTE — Patient Outreach (Signed)
  Care Management   Follow Up Note   06/13/2023 Name: [name] [name] MRN: [id number] DOB: [birth year]   Referred by: Sirivol, Mamatha, MD Reason for referral : Care Management (RNCM: ATTEMPT #2 to Follow Up For Chronic Disease Management & Care Coordination Needs)   A second unsuccessful telephone outreach was attempted today. The patient was referred to the case management team for assistance with care management and care coordination.   Follow Up Plan: The care management team will reach out to the patient again over the next 30 days.   Grandville Lax, BSN RN RN Care Manager  [place]  [facility]ulatory Care Management  Direct Number: [phone]

## 2023-06-27 ENCOUNTER — Telehealth: Payer: Self-pay | Admitting: *Deleted

## 2023-06-27 NOTE — Progress Notes (Unsigned)
Complex Care Management Care Guide Note  06/27/2023 Name: CHARISSA KNOWLES MRN: [id number] DOB: 08-Jun-1945  Doralee Albino Dumond is a 78 y.o. year old female who is a primary care patient of Windell Moment, MD and is actively engaged with the care management team. I reached out to Doralee Albino Melaragno by phone today to assist with re-scheduling  with the RN Case Manager.  Follow up plan: Unsuccessful telephone outreach attempt made.   Gwenevere Ghazi  [facility]  [facility], [facility] Guide  Direct Dial: [phone]  Fax [phone]

## 2023-07-04 NOTE — Progress Notes (Signed)
 Complex Care Management Care Guide Note  07/04/2023 Name: [name] [name] MRN: [id number] DOB: 1946/02/18  [name] [name] is a 78 y.o. year old female who is a primary care patient of Sirivol, Mamatha, MD and is actively engaged with the care management team. I reached out to [name] CHRISTELLA Sanders by phone today to assist with re-scheduling  with the RN Case Manager.  Follow up plan: Telephone appointment with complex care management team member scheduled for:  07/11/23  Harlene Satterfield  [facility]  Value[facility]Based Care [facility], [facility] Guide  Direct Dial: [phone]  Fax [phone]

## 2023-07-05 DIAGNOSIS — Z79891 Long term (current) use of opiate analgesic: Secondary | ICD-10-CM | POA: Diagnosis not present

## 2023-07-05 DIAGNOSIS — M48061 Spinal stenosis, lumbar region without neurogenic claudication: Secondary | ICD-10-CM | POA: Diagnosis not present

## 2023-07-05 DIAGNOSIS — M47816 Spondylosis without myelopathy or radiculopathy, lumbar region: Secondary | ICD-10-CM | POA: Diagnosis not present

## 2023-07-05 DIAGNOSIS — G894 Chronic pain syndrome: Secondary | ICD-10-CM | POA: Diagnosis not present

## 2023-07-11 ENCOUNTER — Other Ambulatory Visit: Payer: 59 | Admitting: *Deleted

## 2023-07-11 ENCOUNTER — Encounter: Payer: Self-pay | Admitting: *Deleted

## 2023-07-11 ENCOUNTER — Telehealth: Payer: Self-pay

## 2023-07-11 ENCOUNTER — Other Ambulatory Visit: Payer: Self-pay | Admitting: *Deleted

## 2023-07-11 DIAGNOSIS — Z5982 Transportation insecurity: Secondary | ICD-10-CM

## 2023-07-11 NOTE — Patient Instructions (Signed)
Visit Information  Thank you for taking time to visit with me today. Please don't hesitate to contact me if I can be of assistance to you before our next scheduled telephone appointment.  Following are the goals we discussed today:   Goals Addressed             This Visit's Progress    RNCM Care Management  Expected Outcome:  Monitor, Self-Manage and Reduce Symptoms of: Depression       Current Barriers:  Knowledge Deficits related to effective management of pain and the impact her pain level has on her depression and stress level Care Coordination needs related to pain control and resources to help with dealing with her depression  in a patient with depression and high stress level Chronic Disease Management support and education needs related to effective management of depression  Lacks caregiver support.  Transportation barriers Grief and loss: Husband passed in 2014 and her son passed in 2022 of lung cancer   Planned Interventions: Evaluation of current treatment plan related to depression  and patient's adherence to plan as established by provider. Reports that she has some ups and downs and overall just not feeling well. She reports issues with  being anxious, having insomnia. She reports that she drinks 3-4 pots of coffee daily, along with tea. RNCM advised that she should cut back on her caffeine intake to see if that will help with her insomnia and shakiness. She reports that she does not speak with a cousnelor but would like to speak with LCSW for depression/anxiety/stress, referral placed. Advised patient to call the office for changes in mood, anxiety, stress level, depression, or mental health needs Provided education to patient re: resources available for transportation needs, resources for effective management of pain and discomfort, resources for effective management of depression and ongoing support Reviewed medications with patient and discussed compliance.  Provided  patient with community resources and other resources to help in management of her chronic conditions educational materials related to effective management of depression Reviewed scheduled/upcoming provider appointments including 08-14-23 with PCP Care Guide referral for transportation needs. Updated referral placed due to transportation insecurities. Discussed plans with patient for ongoing care management follow up and provided patient with direct contact information for care management team Advised patient to discuss changes in her mood, depression, or mental health needs with provider Screening for signs and symptoms of depression related to chronic disease state  Assessed social determinant of health barriers  Symptom Management: Take medications as prescribed   Attend all scheduled provider appointments Call provider office for new concerns or questions  call the Suicide and Crisis Lifeline: 988 call the [place] National Suicide Prevention Lifeline: 301-581-3025 or TTY: 769-160-2251 TTY 516 340 8594) to talk to a trained counselor call 1-800-273-TALK (toll free, 24 hour hotline) if experiencing a Mental Health or Behavioral Health Crisis   Follow Up Plan: Telephone follow up appointment with care management team member scheduled for: 08-14-2023 at 1:45 pm       RNCM Care Management  Expected Outcome:  Monitor, Self-Manage and Reduce Symptoms of: Falls prevention and Safety       Current Barriers:  Knowledge Deficits related to fall prevention and safety in a patient with multiple chronic conditions Care Coordination needs related to transportation concerns in a patient with frequent falls and chronic pain  Chronic Disease Management support and education needs related to fall prevention and safety Lacks caregiver support.  Transportation barriers  Planned Interventions: Provided  verbal education re:  potential causes of falls and Fall prevention strategies. Reports recent fall  approximately 2 months ago. States that she feels off balance and shaky. She reports drinking 3-4 pots of coffee and tea daily. She reports being unable to sleep nightly and she is unsure why. RNCM suggested cutting back on her caffeine or switching to decaf.  Reviewed medications and discussed potential side effects of medications such as dizziness and frequent urination. The patient states she is compliant with medications. Does have dizziness at times. Review of changing position slowly and monitoring for drops in blood pressure.  Advised patient of importance of notifying provider of falls. Review and education provided Assessed for signs and symptoms of orthostatic hypotension. Review and education provided Assessed for falls since last encounter.  Assessed patients knowledge of fall risk prevention secondary to previously provided education. Review and education provided.  Provided patient information for fall alert systems Assessed working status of life alert bracelet and patient adherence Advised patient to discuss changes in safety in her environment, new questions or concerns related to her chronic conditions and high fall risk with provider Screening for signs and symptoms of depression related to chronic disease state Assessed social determinant of health barriers  Symptom Management: Take medications as prescribed   Attend all scheduled provider appointments Call provider office for new concerns or questions  call the Suicide and Crisis Lifeline: 988 call the [place] National Suicide Prevention Lifeline: 408 142 0216 or TTY: 865-207-6986 TTY 661 547 0016) to talk to a trained counselor call 1-800-273-TALK (toll free, 24 hour hotline) if experiencing a Mental Health or Behavioral Health Crisis  Report new falls or safety concerns to the provider  Follow Up Plan: Telephone follow up appointment with care management team member scheduled for: 08-14-2023 at 1:45 pm       RNCM Care  Management  Expected Outcome:  Monitor, Self-Manage and Reduce Symptoms of: HLD       Current Barriers:  Chronic Disease Management support and education needs related to effective management of HLD Lab Results  Component Value Date   CHOL 155 01/08/2023   HDL 68 01/08/2023   LDLCALC 73 01/08/2023   TRIG 71 01/08/2023   CHOLHDL 2.3 01/08/2023     Planned Interventions: Provider established cholesterol goals reviewed. The patient is at goal. Denies any acute change. Counseled on importance of regular laboratory monitoring as prescribed. Labs to be updated. Provided HLD educational materials; Reviewed role and benefits of statin for ASCVD risk reduction. The patient is compliant with her Rosuvastatin 10 mg as directed; Discussed strategies to manage statin-induced myalgias. Denies myalgias Reviewed importance of limiting foods high in cholesterol. Review of heart healthy diet. The patient states she is eating but since having her hernia surgery she has lost weight and does not have a good appetite. RNCM advised to follow up with Dr. Chales Abrahams or schedule an earlier appointment with PCP Screening for signs and symptoms of depression related to chronic disease state;  Assessed social determinant of health barriers;   Symptom Management: Take medications as prescribed   Attend all scheduled provider appointments Attend church or other social activities Perform all self care activities independently  Perform IADL's (shopping, preparing meals, housekeeping, managing finances) independently Call provider office for new concerns or questions  Work with the social worker to address care coordination needs and will continue to work with the clinical team to address health care and disease management related needs call the Suicide and Crisis Lifeline: 988 call the [place] National Suicide Prevention Lifeline:  (670) 339-5443 or TTY: 509-641-5577 TTY (507)866-8093) to talk to a trained counselor call  1-800-273-TALK (toll free, 24 hour hotline) if experiencing a Mental Health or Behavioral Health Crisis  - call for medicine refill 2 or 3 days before it runs out - take all medications exactly as prescribed - call doctor with any symptoms you believe are related to your medicine - call doctor when you experience any new symptoms - go to all doctor appointments as scheduled - adhere to prescribed diet: heart healthy/ADA diet   Follow Up Plan: Telephone follow up appointment with care management team member scheduled for: 08-14-2023 at 1:45 pm           Our next appointment is by telephone on 08-14-2023 at 1:45 pm  Please call the care guide team at [phone] if you need to cancel or reschedule your appointment.   If you are experiencing a Mental Health or Behavioral Health Crisis or need someone to talk to, please call the Suicide and Crisis Lifeline: 988 call the [place] National Suicide Prevention Lifeline: (269)060-6542 or TTY: 708-218-0923 TTY 4193713194) to talk to a trained counselor call 1-800-273-TALK (toll free, 24 hour hotline)   Patient verbalizes understanding of instructions and care plan provided today and agrees to view in MyChart. Active MyChart status and patient understanding of how to access instructions and care plan via MyChart confirmed with patient.     Telephone follow up appointment with care management team member scheduled for:08-14-2023 at 1:45 pm  Larey Brick, BSN RN [facility], [facility] RN Care Manager Direct Dial: [phone]  Fax: [phone]

## 2023-07-11 NOTE — Patient Outreach (Signed)
Care Management   Visit Note  07/11/2023 Name: [name] [name] MRN: [id number] DOB: [birth year]  Subjective: [name] [name] is a 78 y.o. year old female who is a primary care patient of Windell Moment, MD. The Care Management team was consulted for assistance.      Engaged with patient spoke with patient by telephone.    Goals Addressed             This Visit's Progress    RNCM Care Management  Expected Outcome:  Monitor, Self-Manage and Reduce Symptoms of: Depression       Current Barriers:  Knowledge Deficits related to effective management of pain and the impact her pain level has on her depression and stress level Care Coordination needs related to pain control and resources to help with dealing with her depression  in a patient with depression and high stress level Chronic Disease Management support and education needs related to effective management of depression  Lacks caregiver support.  Transportation barriers Grief and loss: Husband passed in 2014 and her son passed in 2022 of lung cancer   Planned Interventions: Evaluation of current treatment plan related to depression  and patient's adherence to plan as established by provider. Reports that she has some ups and downs and overall just not feeling well. She reports issues with  being anxious, having insomnia. She reports that she drinks 3-4 pots of coffee daily, along with tea. RNCM advised that she should cut back on her caffeine intake to see if that will help with her insomnia and shakiness. She reports that she does not speak with a cousnelor but would like to speak with LCSW for depression/anxiety/stress, referral placed. Advised patient to call the office for changes in mood, anxiety, stress level, depression, or mental health needs Provided education to patient re: resources available for transportation needs, resources for effective management of pain and discomfort, resources for effective management of depression and  ongoing support Reviewed medications with patient and discussed compliance.  Provided patient with community resources and other resources to help in management of her chronic conditions educational materials related to effective management of depression Reviewed scheduled/upcoming provider appointments including 08-14-23 with PCP Care Guide referral for transportation needs. Updated referral placed due to transportation insecurities. Discussed plans with patient for ongoing care management follow up and provided patient with direct contact information for care management team Advised patient to discuss changes in her mood, depression, or mental health needs with provider Screening for signs and symptoms of depression related to chronic disease state  Assessed social determinant of health barriers  Symptom Management: Take medications as prescribed   Attend all scheduled provider appointments Call provider office for new concerns or questions  call the Suicide and Crisis Lifeline: 988 call the [place] National Suicide Prevention Lifeline: 928-345-6967 or TTY: (747)545-6691 TTY 6463909169) to talk to a trained counselor call 1-800-273-TALK (toll free, 24 hour hotline) if experiencing a Mental Health or Behavioral Health Crisis   Follow Up Plan: Telephone follow up appointment with care management team member scheduled for: 08-14-2023 at 1:45 pm       RNCM Care Management  Expected Outcome:  Monitor, Self-Manage and Reduce Symptoms of: Falls prevention and Safety       Current Barriers:  Knowledge Deficits related to fall prevention and safety in a patient with multiple chronic conditions Care Coordination needs related to transportation concerns in a patient with frequent falls and chronic pain  Chronic Disease Management support and education needs related  to fall prevention and safety Lacks caregiver support.  Transportation barriers  Planned Interventions: Provided  verbal education  re: potential causes of falls and Fall prevention strategies. Reports recent fall approximately 2 months ago. States that she feels off balance and shaky. She reports drinking 3-4 pots of coffee and tea daily. She reports being unable to sleep nightly and she is unsure why. RNCM suggested cutting back on her caffeine or switching to decaf.  Reviewed medications and discussed potential side effects of medications such as dizziness and frequent urination. The patient states she is compliant with medications. Does have dizziness at times. Review of changing position slowly and monitoring for drops in blood pressure.  Advised patient of importance of notifying provider of falls. Review and education provided Assessed for signs and symptoms of orthostatic hypotension. Review and education provided Assessed for falls since last encounter.  Assessed patients knowledge of fall risk prevention secondary to previously provided education. Review and education provided.  Provided patient information for fall alert systems Assessed working status of life alert bracelet and patient adherence Advised patient to discuss changes in safety in her environment, new questions or concerns related to her chronic conditions and high fall risk with provider Screening for signs and symptoms of depression related to chronic disease state Assessed social determinant of health barriers  Symptom Management: Take medications as prescribed   Attend all scheduled provider appointments Call provider office for new concerns or questions  call the Suicide and Crisis Lifeline: 988 call the [place] National Suicide Prevention Lifeline: 458-647-1334 or TTY: 717-532-6215 TTY (405) 778-8969) to talk to a trained counselor call 1-800-273-TALK (toll free, 24 hour hotline) if experiencing a Mental Health or Behavioral Health Crisis  Report new falls or safety concerns to the provider  Follow Up Plan: Telephone follow up appointment with  care management team member scheduled for: 08-14-2023 at 1:45 pm       RNCM Care Management  Expected Outcome:  Monitor, Self-Manage and Reduce Symptoms of: HLD       Current Barriers:  Chronic Disease Management support and education needs related to effective management of HLD Lab Results  Component Value Date   CHOL 155 01/08/2023   HDL 68 01/08/2023   LDLCALC 73 01/08/2023   TRIG 71 01/08/2023   CHOLHDL 2.3 01/08/2023     Planned Interventions: Provider established cholesterol goals reviewed. The patient is at goal. Denies any acute change. Counseled on importance of regular laboratory monitoring as prescribed. Labs to be updated. Provided HLD educational materials; Reviewed role and benefits of statin for ASCVD risk reduction. The patient is compliant with her Rosuvastatin 10 mg as directed; Discussed strategies to manage statin-induced myalgias. Denies myalgias Reviewed importance of limiting foods high in cholesterol. Review of heart healthy diet. The patient states she is eating but since having her hernia surgery she has lost weight and does not have a good appetite. RNCM advised to follow up with Dr. Chales Abrahams or schedule an earlier appointment with PCP Screening for signs and symptoms of depression related to chronic disease state;  Assessed social determinant of health barriers;   Symptom Management: Take medications as prescribed   Attend all scheduled provider appointments Attend church or other social activities Perform all self care activities independently  Perform IADL's (shopping, preparing meals, housekeeping, managing finances) independently Call provider office for new concerns or questions  Work with the social worker to address care coordination needs and will continue to work with the clinical team to address health care  and disease management related needs call the Suicide and Crisis Lifeline: 988 call the [place] National Suicide Prevention Lifeline: 848-707-3346  or TTY: 640-533-0229 TTY (873) 680-0938) to talk to a trained counselor call 1-800-273-TALK (toll free, 24 hour hotline) if experiencing a Mental Health or Behavioral Health Crisis  - call for medicine refill 2 or 3 days before it runs out - take all medications exactly as prescribed - call doctor with any symptoms you believe are related to your medicine - call doctor when you experience any new symptoms - go to all doctor appointments as scheduled - adhere to prescribed diet: heart healthy/ADA diet   Follow Up Plan: Telephone follow up appointment with care management team member scheduled for: 08-14-2023 at 1:45 pm              Consent to Services:  Patient was given information about care management services, agreed to services, and gave verbal consent to participate.   Plan: Telephone follow up appointment with care management team member scheduled for:08-14-2023 at 1:45 pm  Larey Brick, BSN RN [facility], [facility] RN Care Manager Direct Dial: [phone]  Fax: [phone]

## 2023-07-11 NOTE — Progress Notes (Signed)
Complex Care Management Note Care Guide Note  07/11/2023 Name: [name] [name] MRN: [id number] DOB: [birth year]   Complex Care Management Outreach Attempts: An unsuccessful telephone outreach was attempted today to offer the patient information about available complex care management services.  Follow Up Plan:  Additional outreach attempts will be made to offer the patient complex care management information and services.   Encounter Outcome:  No Answer  Kora Groom [facility]  [facility] Guide Direct Dial: [phone]  Fax: [phone] Website: [email or web address]

## 2023-07-12 ENCOUNTER — Telehealth: Payer: Self-pay | Admitting: *Deleted

## 2023-07-12 NOTE — Progress Notes (Signed)
Complex Care Management Note  Care Guide Note 07/12/2023 Name: [name] [name] MRN: [id number] DOB: [birth year]  [name] [name] is a 78 y.o. year old female who sees Windell Moment, MD for primary care. I reached out to [name] [name] by phone today to offer complex care management services.  [name] [name] was given information about Complex Care Management services today including:   The Complex Care Management services include support from the care team which includes your Nurse Care Manager, Clinical Social Worker, or Pharmacist.  The Complex Care Management team is here to help remove barriers to the health concerns and goals most important to you. Complex Care Management services are voluntary, and the patient may decline or stop services at any time by request to their care team member.   Complex Care Management Consent Status: Patient agreed to services and verbal consent obtained.   Follow up plan:  Telephone appointment with complex care management team member scheduled for:  07/13/2023  Encounter Outcome:  Patient Scheduled  Burman Nieves, CMA, Care Guide [facility]  [facility], [facility] Guide Direct Dial: [phone]  Fax: [phone] Website: [email or web address]

## 2023-07-13 ENCOUNTER — Ambulatory Visit: Payer: Self-pay | Admitting: Licensed Clinical Social Worker

## 2023-07-13 ENCOUNTER — Telehealth: Payer: Self-pay

## 2023-07-13 NOTE — Progress Notes (Signed)
Complex Care Management Note Care Guide Note  07/13/2023 Name: [name] [name] MRN: [id number] DOB: 10-26-1945  [name] [name] is a 78 y.o. year old female who is a primary care patient of Sirivol, Kristie Cowman, MD . The community resource team was consulted for assistance with Transportation Needs   SDOH screenings and interventions completed:  Yes  Social Drivers of Health From This Encounter   Food Insecurity: No Food Insecurity (07/13/2023)   Hunger Vital Sign    Worried About Running Out of Food in the Last Year: Never true    Ran Out of Food in the Last Year: Never true  Housing: Low Risk  (07/13/2023)   Housing Stability Vital Sign    Unable to Pay for Housing in the Last Year: No    Number of Times Moved in the Last Year: 0    Homeless in the Last Year: No  Financial Resource Strain: Low Risk  (07/13/2023)   Overall Financial Resource Strain (CARDIA)    Difficulty of Paying Living Expenses: Not very hard  Transportation Needs: Unmet Transportation Needs (07/13/2023)   PRAPARE - Transportation    Lack of Transportation (Medical): Yes    Lack of Transportation (Non-Medical): Yes  Utilities: Not At Risk (07/13/2023)   Utilities    Threatened with loss of utilities: No    SDOH Interventions Today    Flowsheet Row Most Recent Value  SDOH Interventions   Transportation Interventions Payor Benefit, Other (Comment)  [Spoke with patient about Restaurant manager, fast food transportation [phone]. Patient has used this service and stated she is able to call unassisted. Patient has also used [employer]. No further assistance needed at this time.]        Care guide performed the following interventions: Spoke with patient about [employer] transportation [phone]. Patient has used this service and stated she is able to call unassisted. Patient is also familiar with [employer] transportation and has used it before.  No further assistance needed at this time.  Follow Up Plan:   No further follow up planned at this time. The patient has been provided with needed resources.  Encounter Outcome:  Patient Visit Completed  Yobani Schertzer [facility]  [facility] Guide Direct Dial: [phone]  Fax: [phone] Website: [email or web address]

## 2023-07-13 NOTE — Patient Outreach (Signed)
  Care Coordination   Initial Visit Note   07/13/2023 Name: [name] [name] MRN: [id number] DOB: 23-Nov-1945  [name] [name] is a 78 y.o. year old female who sees Windell Moment, MD for primary care. I spoke with  [name] [name] by phone today.  What matters to the patients health and wellness today?  Connecting with community transportation services    Goals Addressed             This Visit's Progress    Care Coordination       Care Coordination Interventions: Assessed Social Determinants of Health Reviewed all upcoming appointments in Epic system Motivational Interviewing employed Solution-Focused Strategies employed:  Active listening / Reflection utilized  Emotional Support Provided Problem Solving /Task Center strategies reviewed Contact [employer] as needed for community transportation          SDOH assessments and interventions completed:  Yes     Care Coordination Interventions:  Yes, provided   Interventions Today    Flowsheet Row Most Recent Value  Chronic Disease   Chronic disease during today's visit Other  [Community Resources]  General Interventions   General Interventions Discussed/Reviewed [employer], General Interventions Discussed  [Pt stated she currently uses friends/neighbors for transportation services. Pt used [employer] before, confirmed knowledge of how to use service. Her insurance no longer provides transportation. Discussed other community resouces including [facility]]  Mental Health Interventions   Mental Health Discussed/Reviewed Mental Health Discussed, Mental Health Reviewed  [Pt reported that she experiences some symptoms of depression but feels that she manages these well at this time.]        Follow up plan: Follow up call scheduled for 08/07/2023    Encounter Outcome:  Patient Visit Completed   Kenton Kingfisher, LCSW [place], [facility] Licensed Clinical Social Worker Care  Coordinator [phone]

## 2023-07-13 NOTE — Patient Instructions (Signed)
Visit Information  Thank you for taking time to visit with me today. Please don't hesitate to contact me if I can be of assistance to you.   Following are the goals we discussed today:   Goals Addressed             This Visit's Progress    Care Coordination       Care Coordination Interventions: Assessed Social Determinants of Health Reviewed all upcoming appointments in Epic system Motivational Interviewing employed Solution-Focused Strategies employed:  Active listening / Reflection utilized  Emotional Support Provided Problem Solving /Task Center strategies reviewed Contact [employer] as needed for community transportation          Our next appointment is by telephone on 08/07/23.  Please call the care guide team at [phone] if you need to cancel or reschedule your appointment.   If you are experiencing a Mental Health or Behavioral Health Crisis or need someone to talk to, please call the Suicide and Crisis Lifeline: 988  Patient verbalizes understanding of instructions and care plan provided today and agrees to view in MyChart. Active MyChart status and patient understanding of how to access instructions and care plan via MyChart confirmed with patient.     Telephone follow up appointment with care management team member scheduled for: 08/07/2023

## 2023-07-30 ENCOUNTER — Other Ambulatory Visit: Payer: Self-pay | Admitting: Family Medicine

## 2023-07-30 DIAGNOSIS — I10 Essential (primary) hypertension: Secondary | ICD-10-CM

## 2023-07-30 DIAGNOSIS — F322 Major depressive disorder, single episode, severe without psychotic features: Secondary | ICD-10-CM

## 2023-08-01 ENCOUNTER — Other Ambulatory Visit: Payer: Self-pay | Admitting: Family Medicine

## 2023-08-02 DIAGNOSIS — M48061 Spinal stenosis, lumbar region without neurogenic claudication: Secondary | ICD-10-CM | POA: Diagnosis not present

## 2023-08-02 DIAGNOSIS — M47816 Spondylosis without myelopathy or radiculopathy, lumbar region: Secondary | ICD-10-CM | POA: Diagnosis not present

## 2023-08-02 DIAGNOSIS — M545 Low back pain, unspecified: Secondary | ICD-10-CM | POA: Diagnosis not present

## 2023-08-02 DIAGNOSIS — M51369 Other intervertebral disc degeneration, lumbar region without mention of lumbar back pain or lower extremity pain: Secondary | ICD-10-CM | POA: Diagnosis not present

## 2023-08-07 ENCOUNTER — Ambulatory Visit: Payer: Self-pay | Admitting: Licensed Clinical Social Worker

## 2023-08-07 NOTE — Patient Outreach (Signed)
 Care Coordination   Follow Up Visit Note   08/07/2023 Name: [name] [name] MRN: [id number] DOB: 1946/04/21  [name] [name] is a 78 y.o. year old female who sees Windell Moment, MD for primary care. I spoke with  [name] [name] by phone today.  What matters to the patients health and wellness today?  Better managing my symptoms of depression.    Goals Addressed             This Visit's Progress    Care Coordination       Care Coordination Interventions: Assessed Social Determinants of Health Reviewed all upcoming appointments in Epic system Motivational Interviewing employed Solution-Focused Strategies employed:  Active listening / Reflection utilized  Emotional Support Provided Problem Solving /Task Center strategies reviewed Contact [employer] as needed for community transportation Monitor for symptoms of depression and utilize coping skills as needed.          SDOH assessments and interventions completed:  Yes     Care Coordination Interventions:  Yes, provided   Interventions Today    Flowsheet Row Most Recent Value  Chronic Disease   Chronic disease during today's visit Other  [Depression]  General Interventions   General Interventions Discussed/Reviewed [employer], General Interventions Discussed  [PT confirmed knowledge of [employer] as a transportation option but will continue to use friends primarily. Pt will use [employer] if needed.]  Mental Health Interventions   Mental Health Discussed/Reviewed Mental Health Discussed, Mental Health Reviewed, Coping Strategies, Depression  [Pt reported that she has episodes of depression primarily when she's feeling lonely or runs out of things to do. We discussed her coping skills and what she can utilize to help with this. We also discussed monitoring her mood for increased symptoms.]        Follow up plan: Follow up call scheduled for 09/07/2023    Encounter Outcome:  Patient Visit Completed   Kenton Kingfisher, LCSW [facility], [facility] Licensed Clinical Social Worker Care Coordinator [phone]

## 2023-08-14 ENCOUNTER — Other Ambulatory Visit: Payer: Self-pay | Admitting: *Deleted

## 2023-08-14 ENCOUNTER — Ambulatory Visit (INDEPENDENT_AMBULATORY_CARE_PROVIDER_SITE_OTHER): Payer: 59

## 2023-08-14 VITALS — BP 100/66 | HR 64 | Temp 97.6°F | Ht 62.0 in | Wt 125.0 lb

## 2023-08-14 DIAGNOSIS — E1159 Type 2 diabetes mellitus with other circulatory complications: Secondary | ICD-10-CM

## 2023-08-14 DIAGNOSIS — F17200 Nicotine dependence, unspecified, uncomplicated: Secondary | ICD-10-CM | POA: Diagnosis not present

## 2023-08-14 DIAGNOSIS — F322 Major depressive disorder, single episode, severe without psychotic features: Secondary | ICD-10-CM | POA: Diagnosis not present

## 2023-08-14 DIAGNOSIS — J410 Simple chronic bronchitis: Secondary | ICD-10-CM

## 2023-08-14 DIAGNOSIS — E782 Mixed hyperlipidemia: Secondary | ICD-10-CM

## 2023-08-14 DIAGNOSIS — M48062 Spinal stenosis, lumbar region with neurogenic claudication: Secondary | ICD-10-CM

## 2023-08-14 DIAGNOSIS — E559 Vitamin D deficiency, unspecified: Secondary | ICD-10-CM

## 2023-08-14 HISTORY — DX: Type 2 diabetes mellitus with other circulatory complications: E11.59

## 2023-08-14 NOTE — Assessment & Plan Note (Addendum)
 Severe chronic pain affecting her back, hip, legs, and arms. Currently under pain management with morphine 15 mg, two to three times daily, but reports inadequate pain relief. Pain management injections have been ineffective.   Discussed medication tolerance and alternative pain management strategies.  She will follow up with her pain management specialist to assess morphine's effectiveness. - Follow up with pain management specialist at the [facility] on March 25 to assess morphine's effectiveness.        Insomnia Reports chronic insomnia, averaging two hours of sleep per night. Long-term use of sleeping pills may have reduced efficacy. - Re-evaluate current sleep medication regimen and consider alternative treatments for insomnia.  General Health Maintenance Due for routine blood work and a low-dose chest CT scan for lung cancer screening. Well-controlled diabetes with an A1c of 4.6. Needs a diabetic eye exam. - Order CBC, CMP, and lipid panel. - Order low-dose chest CT scan for lung cancer screening. - Schedule diabetic eye exam.  Goals of Care Provided with advanced directive paperwork to ensure her wishes are known in case of sudden health changes. - Provide advanced directive paperwork for completion.  Follow-up Requires follow-up for chronic disease management and to review test results. - Follow up in 2-3 months for chronic disease management. - Review blood work and CT scan results once available.

## 2023-08-14 NOTE — Assessment & Plan Note (Signed)
 Very well controlled, with an A1C of 4.6 at home assessment by a FNP and finger stick A1C monitor.  Not on any medications.  Will recheck blood work.  Advised to make appt for diabetic eye exam.

## 2023-08-14 NOTE — Assessment & Plan Note (Signed)
 Currently on Crestor 10 mg daily. Ordered repeat lipid panel to monitor

## 2023-08-14 NOTE — Assessment & Plan Note (Signed)
 Currently on Wellbutrin  States she was on an anti depressant before but did not find it helpful.  Experiences depression primarily due to chronic pain. Currently taking Wellbutrin for depression. Diazepam was discontinued due to potential interactions with morphine. Explained to her that diazepam is not an anti depressant medication,. - Continue Wellbutrin for depression management. - Consider psychiatric referral if symptoms worsen.

## 2023-08-14 NOTE — Assessment & Plan Note (Signed)
 COPD with dyspnea, potentially exacerbated by mold exposure in her apartment. Uses albuterol inhaler, nebulizer, and Breztri regularly.  Smokes approximately one pack of cigarettes per day, significantly contributing to respiratory issues.   Mold primarily causes breathing problems, and smoking exacerbates lung damage.  - Encourage smoking cessation to improve respiratory health.  - Continue using albuterol inhaler, nebulizer, and Breztri as prescribed. - Address mold issue in the apartment by cleaning or contacting the manager for remediation

## 2023-08-14 NOTE — Progress Notes (Signed)
 Subjective:  Patient ID: [name] [name], female    DOB: 1945-09-27  Age: 78 y.o. MRN: [id number]  Chief Complaint  Patient presents with   Medical Management of Chronic Issues    Discussed the use of AI scribe software for clinical note transcription with the patient, who gave verbal consent to proceed.   [name] [name] is a 78 year old female with severe chronic pain who presents with worsening pain and difficulty managing symptoms.  She experiences severe chronic pain affecting her back, hips, legs, and arms, which has been worsening. She is under the care of a pain management specialist and her medication was changed from Percocet to morphine 15 mg, taken two to three times a day as needed. However, she states that the medication is not alleviating her pain. She also reports significant fatigue, difficulty walking, and an inability to stand for long periods. She has received several pain management injections in the past, which have not provided relief.  She reports issues with her appetite and weight, having lost weight but notes a recent gain of six pounds since March 7th, when she weighed 119 pounds. She has been consuming Ensure for additional nutrition and eating canned soup and frozen dinners. She dislikes hamburger and meat in general.  She has COPD and uses an albuterol inhaler and nebulizer twice a week, as well as Breztri regularly. She continues to smoke about a pack a day and has not been successful in reducing her smoking.  She experiences depression, primarily due to her chronic pain, and is currently taking Wellbutrin for depression. She was previously on diazepam but discontinued it due to interactions with morphine. She reports poor sleep, averaging two hours per night, and notes that her sleeping pills are no longer effective. She has been on them for a long time, which she believes may contribute to their lack of efficacy.  She reports swelling in her feet and legs, noting  trace pitting edema in both legs and ankles. She takes a stool softener to manage constipation associated with morphine use and reports regular bowel movements.  She lives in a senior citizen apartment complex and is concerned about mold found in her bathroom, which she fears may be affecting her health. She reports dry skin and hair but acknowledges that mold typically causes respiratory issues.      08/14/2023   10:59 AM 08/01/2022    3:48 PM 06/26/2022   11:09 AM 02/14/2022    2:55 PM 11/03/2021   11:29 AM  Depression screen PHQ 2/9  Decreased Interest 0 0 0 1 1  Down, Depressed, Hopeless 0 1 1 2 1   PHQ - 2 Score 0 1 1 3 2   Altered sleeping 3 3 3 2 3   Tired, decreased energy 3 3 3 3 3   Change in appetite 2 2 2 3 3   Feeling bad or failure about yourself  0 0 0 2 0  Trouble concentrating 3 2 2 2 3   Moving slowly or fidgety/restless 0 1 1 2 1   Suicidal thoughts 0 0 0 0 0  PHQ-9 Score 11 12 12 17 15   Difficult doing work/chores Not difficult at all Not difficult at all Not difficult at all Somewhat difficult Not difficult at all        08/14/2023   10:59 AM  Fall Risk   Number falls in past yr: 0  Injury with Fall? 0  Risk for fall due to : No Fall Risks  Patient Care Team: Windell Moment, MD as PCP - General (Family Medicine) Thomasene Ripple, DO as PCP - Cardiology (Cardiology) Ricky Stabs, RN as [facility] Care Management (General Practice) Lillia Mountain, LCSW as Social Worker (Licensed Visual merchandiser)   Review of Systems  Constitutional:  Negative for chills, fatigue and fever.  HENT:  Negative for congestion, ear pain, sinus pressure and sore throat.   Respiratory:  Negative for cough.   Cardiovascular:  Positive for leg swelling. Negative for chest pain.  Gastrointestinal:  Negative for abdominal pain, constipation, diarrhea, nausea and vomiting.  Genitourinary:  Negative for dysuria and frequency.  Musculoskeletal:  Positive for back pain. Negative for  arthralgias and myalgias.  Neurological:  Negative for dizziness and headaches.  Psychiatric/Behavioral:  Positive for sleep disturbance. The patient is not nervous/anxious.     Current Outpatient Medications on File Prior to Visit  Medication Sig Dispense Refill   albuterol (PROVENTIL) (2.5 MG/3ML) 0.083% nebulizer solution Take 3 mLs (2.5 mg total) by nebulization every 6 (six) hours as needed for wheezing or shortness of breath. 120 mL 6   albuterol (VENTOLIN HFA) 108 (90 Base) MCG/ACT inhaler INHALE TWO (2) PUFFS BY MOUTH INTO THE LUNGS EVERY 6 HOURS AS NEEDED FOR SHORTNESS OF BREATH/WHEEZING 8.5 g 2   aspirin EC 81 MG tablet Take 1 tablet (81 mg total) by mouth daily. Swallow whole. 90 tablet 3   BREZTRI AEROSPHERE 160-9-4.8 MCG/ACT AERO INHALE TWO (2) PUFFS BY MOUTH INTO THE LUNGS TWICE DAILY AS NEEDED FOR SHORTNESS OF BREATH 10.7 g 10   buPROPion (WELLBUTRIN XL) 150 MG 24 hr tablet TAKE 1 TABLET BY MOUTH EVERY MORNING 90 tablet 1   cloNIDine (CATAPRES) 0.1 MG tablet TAKE ONE (1) TABLET BY MOUTH TWICE DAILY 60 tablet 10   finasteride (PROPECIA) 1 MG tablet TAKE ONE TABLET BY MOUTH DAILY 30 tablet 3   hydrOXYzine (VISTARIL) 25 MG capsule Take 1 capsule (25 mg total) by mouth every 8 (eight) hours as needed. 30 capsule 0   isosorbide mononitrate (IMDUR) 30 MG 24 hr tablet Take 0.5 tablets (15 mg total) by mouth daily. Patient needs appointment for further refills. 1 st attempt 15 tablet 10   lisinopril-hydrochlorothiazide (ZESTORETIC) 20-12.5 MG tablet TAKE TWO (2) TABLETS BY MOUTH EVERY MORNING 60 tablet 10   meclizine (ANTIVERT) 25 MG tablet TAKE 1 TABLET BY MOUTH THREE TIMES DAILY AS NEEDED FOR DIZZINESS *REFILL REQUEST* 180 tablet 1   morphine (MSIR) 15 MG tablet Take 15 mg by mouth every 8 (eight) hours as needed.     nitroGLYCERIN (NITROSTAT) 0.4 MG SL tablet Place 1 tablet (0.4 mg total) under the tongue every 5 (five) minutes as needed for chest pain. 30 tablet 1   omeprazole  (PRILOSEC) 40 MG capsule TAKE 1 CAPSULE BY MOUTH EVERY MORNING 180 capsule 1   oxyCODONE-acetaminophen (PERCOCET) 7.5-325 MG tablet Take 1 tablet by mouth every 6 (six) hours as needed.     potassium chloride (KLOR-CON M) 10 MEQ tablet TAKE 1 TABLET BY MOUTH ONCE DAILY 30 tablet 10   promethazine (PHENERGAN) 25 MG tablet TAKE ONE TABLET BY MOUTH every SIX hours AS NEEDED FOR NAUSEA AND VOMITING 30 tablet 3   rosuvastatin (CRESTOR) 10 MG tablet TAKE 1 TABLET BY MOUTH EVERY DAY AT BEDTIME 90 tablet 1   traZODone (DESYREL) 100 MG tablet TAKE 1 TABLET BY MOUTH DAILY AT BEDTIME 30 tablet 10   Vitamin D, Ergocalciferol, (DRISDOL) 1.25 MG (50000 UNIT) CAPS capsule TAKE ONE  CAPSULE BY MOUTH ONCE WEEKLY ON MONDAY 12 capsule 2   No current facility-administered medications on file prior to visit.   Past Medical History:  Diagnosis Date   Anxiety    Aortic atherosclerosis (HCC)    Ascending aorta dilation (HCC)    a.) TTE 09/02/19 --> mild; measured 39mm.   Asthma    BMI 24.0-24.9, adult 02/14/2022   CAD (coronary artery disease)    a,) CTA 08/24/20 --> mild CAD (25-49%) in RCA, LAD, LCx; CAC/Agatston score 509.   Cataract    Chronic left shoulder pain 09/27/2015   COPD (chronic obstructive pulmonary disease) (HCC)    Coronary artery disease involving native coronary artery of native heart without angina pectoris 11/18/2020   Degenerative joint disease involving multiple joints 09/28/2017   Depression    Depression, major, single episode, severe (HCC) 03/30/2020   Diastolic dysfunction    a.) TTE 09/02/19 --> LVEF 60-65%; G2DD. b.) TTE 07/23/20 --> LVEF 70-75%; G1DD.   Enlarged aorta (HCC)    Essential hypertension 08/07/2019   GERD (gastroesophageal reflux disease)    GERD with esophagitis 08/07/2019   History of kidney stones    Hyperlipidemia    Hypertension    Long-term current use of opiate analgesic 06/25/2017   Lupus    Myocardial infarction (HCC)    Osteoarthritis    PAC (premature  atrial contraction)    a.) Holter 12/04/19 --> occassional; 1.1% PAC burden.   Pain of right hip joint 11/22/2015   Paraesophageal hernia    PAT (paroxysmal atrial tachycardia) (HCC) 02/03/2020   Presbyesophagus    Primary osteoarthritis of left knee 02/04/2015   PSVT (paroxysmal supraventricular tachycardia) (HCC)    a.) Holter 12/04/19 --> 22 runs.   Renal calculus 12/21/2021   S/P repair of paraesophageal hernia 02/22/2021   S/P total knee arthroplasty 02/15/2015   Schatzki's ring    Sciatica 01/31/2022   Tobacco use 08/19/2019   Type 2 diabetes mellitus with other specified complication (HCC) 08/07/2019   Past Surgical History:  Procedure Laterality Date   ABDOMINAL HYSTERECTOMY     BLADDER SUSPENSION     BREAST BIOPSY Right    COLONOSCOPY  09/04/2011   Colonic polyps, status post polyectomy. Incidental small ascending colon lipoma   ESOPHAGOGASTRODUODENOSCOPY  09/08/2015   Schatzki ring status post esophageal dilitation. Small hiatal hernia   hemorrhoid surgery     KNEE SURGERY     left   TOTAL KNEE ARTHROPLASTY Left 02/15/2015   Procedure: TOTAL KNEE ARTHROPLASTY;  Surgeon: Dannielle Huh, MD;  Location: [facility] OR;  Service: Orthopedics;  Laterality: Left;   TUBAL LIGATION     WRIST SURGERY     right   XI ROBOTIC ASSISTED PARAESOPHAGEAL HERNIA REPAIR N/A 02/22/2021   Procedure: XI ROBOTIC ASSISTED PARAESOPHAGEAL HERNIA REPAIR with RNFA to assist;  Surgeon: Leafy Ro, MD;  Location: [facility] ORS;  Service: General;  Laterality: N/A;    Family History  Problem Relation Age of Onset   Stroke Mother    Heart disease Mother    COPD Father    Cancer Father        Bone   Diabetes Father    Heart disease Father    Stroke Father    Lupus Sister    Seizures Son    COPD Son    Colon cancer Neg Hx    Esophageal cancer Neg Hx    Stomach cancer Neg Hx    Rectal cancer Neg Hx    Social  History   Socioeconomic History   Marital status: Widowed    Spouse name: Not on file   Number of  children: 4   Years of education: Not on file   Highest education level: Not on file  Occupational History   Occupation: Retired  Tobacco Use   Smoking status: Every Day    Current packs/day: 1.00    Average packs/day: 1 pack/day for 48.0 years (48.0 ttl pk-yrs)    Types: Cigarettes   Smokeless tobacco: Never  Vaping Use   Vaping status: Never Used  Substance and Sexual Activity   Alcohol use: No   Drug use: No   Sexual activity: Not Currently  Other Topics Concern   Not on file  Social History Narrative   Not on file   Social Drivers of Health   Financial Resource Strain: Low Risk  (07/13/2023)   Overall Financial Resource Strain (CARDIA)    Difficulty of Paying Living Expenses: Not very hard  Food Insecurity: No Food Insecurity (07/13/2023)   Hunger Vital Sign    Worried About Running Out of Food in the Last Year: Never true    Ran Out of Food in the Last Year: Never true  Transportation Needs: Unmet Transportation Needs (07/13/2023)   PRAPARE - Transportation    Lack of Transportation (Medical): Yes    Lack of Transportation (Non-Medical): Yes  Physical Activity: Inactive (08/01/2022)   Exercise Vital Sign    Days of Exercise per Week: 0 days    Minutes of Exercise per Session: 0 min  Stress: Stress Concern Present (08/01/2022)   [employer] of Occupational Health - Occupational Stress Questionnaire    Feeling of Stress : To some extent  Social Connections: Socially Isolated (05/11/2022)   Social Connection and Isolation Panel [NHANES]    Frequency of Communication with Friends and Family: More than three times a week    Frequency of Social Gatherings with Friends and Family: Twice a week    Attends Religious Services: Never    Database administrator or Organizations: No    Attends Banker Meetings: Never    Marital Status: Widowed    Objective:  BP 100/66   Pulse 64   Temp 97.6 F (36.4 C)   Ht 5\' 2"  (1.575 m)   Wt 125 lb (56.7 kg)   SpO2 96%    BMI 22.86 kg/m      08/14/2023   10:55 AM 04/24/2023    2:02 PM 04/16/2023    1:26 PM  BP/Weight  Systolic BP 100 120 122  Diastolic BP 66 68 60  Wt. (Lbs) 125 122.4 125.8  BMI 22.86 kg/m2 22.39 kg/m2 23.01 kg/m2    Physical Exam Vitals and nursing note reviewed.  Constitutional:      Comments: Thin built female, no acute distress  HENT:     Head: Normocephalic and atraumatic.  Cardiovascular:     Rate and Rhythm: Normal rate and regular rhythm.  Pulmonary:     Comments: Decreased air entry bilaterally Musculoskeletal:        General: Swelling (trace pedal edema both feet and legs) present.     Cervical back: Normal range of motion.  Neurological:     General: No focal deficit present.     Mental Status: She is alert.  Psychiatric:        Mood and Affect: Mood normal.     Diabetic Foot Exam - Simple   No data filed  Lab Results  Component Value Date   WBC 5.4 01/08/2023   HGB 13.4 01/08/2023   HCT 39.3 01/08/2023   PLT 205 01/08/2023   GLUCOSE 85 01/08/2023   CHOL 155 01/08/2023   TRIG 71 01/08/2023   HDL 68 01/08/2023   LDLCALC 73 01/08/2023   ALT 10 01/08/2023   AST 14 01/08/2023   NA 135 01/08/2023   K 3.6 01/08/2023   CL 94 (L) 01/08/2023   CREATININE 0.67 01/08/2023   BUN 8 01/08/2023   CO2 28 01/08/2023   TSH 1.330 09/13/2022   INR 1.02 01/28/2015   HGBA1C 5.2 02/14/2022      Assessment & Plan:  Assessment and Plan       Simple chronic bronchitis (HCC) Assessment & Plan: COPD with dyspnea, potentially exacerbated by mold exposure in her apartment. Uses albuterol inhaler, nebulizer, and Breztri regularly.  Smokes approximately one pack of cigarettes per day, significantly contributing to respiratory issues.   Mold primarily causes breathing problems, and smoking exacerbates lung damage.  - Encourage smoking cessation to improve respiratory health.  - Continue using albuterol inhaler, nebulizer, and Breztri as  prescribed. - Address mold issue in the apartment by cleaning or contacting the manager for remediation  Orders: -     CBC with Differential/Platelet -     Comprehensive metabolic panel -     Lipid panel -     VITAMIN D 25 Hydroxy (Vit-D Deficiency, Fractures) -     TSH -     Hemoglobin A1c -     Hepatitis C antibody  Depression, major, single episode, severe (HCC) Assessment & Plan: Currently on Wellbutrin  States she was on an anti depressant before but did not find it helpful.  Experiences depression primarily due to chronic pain. Currently taking Wellbutrin for depression. Diazepam was discontinued due to potential interactions with morphine. Explained to her that diazepam is not an anti depressant medication,. - Continue Wellbutrin for depression management. - Consider psychiatric referral if symptoms worsen.  Orders: -     CBC with Differential/Platelet -     Comprehensive metabolic panel -     Lipid panel -     VITAMIN D 25 Hydroxy (Vit-D Deficiency, Fractures) -     TSH -     Hemoglobin A1c -     Hepatitis C antibody  Type 2 diabetes mellitus with other circulatory complications (HCC) Assessment & Plan: Very well controlled, with an A1C of 4.6 at home assessment by a FNP and finger stick A1C monitor.  Not on any medications.  Will recheck blood work.  Advised to make appt for diabetic eye exam.  Orders: -     CBC with Differential/Platelet -     Comprehensive metabolic panel -     Lipid panel -     VITAMIN D 25 Hydroxy (Vit-D Deficiency, Fractures) -     TSH -     Hemoglobin A1c -     Hepatitis C antibody  Mixed hyperlipidemia Assessment & Plan: Currently on Crestor 10 mg daily. Ordered repeat lipid panel to monitor   Orders: -     CBC with Differential/Platelet -     Comprehensive metabolic panel -     Lipid panel -     VITAMIN D 25 Hydroxy (Vit-D Deficiency, Fractures) -     TSH -     Hemoglobin A1c -     Hepatitis C antibody  Vitamin D  deficiency Assessment & Plan: History of vitamin D  deficiency. On Ergocalciferol supplementation.  Will recheck vitamin D level and make recommendations  Orders: -     CBC with Differential/Platelet -     Comprehensive metabolic panel -     Lipid panel -     VITAMIN D 25 Hydroxy (Vit-D Deficiency, Fractures) -     TSH -     Hemoglobin A1c -     Hepatitis C antibody  Tobacco use disorder -     CT CHEST LUNG CANCER SCREENING LOW DOSE WO CONTRAST; Future  Spinal stenosis of lumbar region with neurogenic claudication Assessment & Plan: Severe chronic pain affecting her back, hip, legs, and arms. Currently under pain management with morphine 15 mg, two to three times daily, but reports inadequate pain relief. Pain management injections have been ineffective.   Discussed medication tolerance and alternative pain management strategies.  She will follow up with her pain management specialist to assess morphine's effectiveness. - Follow up with pain management specialist at the [facility] on March 25 to assess morphine's effectiveness.        Insomnia Reports chronic insomnia, averaging two hours of sleep per night. Long-term use of sleeping pills may have reduced efficacy. - Re-evaluate current sleep medication regimen and consider alternative treatments for insomnia.  General Health Maintenance Due for routine blood work and a low-dose chest CT scan for lung cancer screening. Well-controlled diabetes with an A1c of 4.6. Needs a diabetic eye exam. - Order CBC, CMP, and lipid panel. - Order low-dose chest CT scan for lung cancer screening. - Schedule diabetic eye exam.  Goals of Care Provided with advanced directive paperwork to ensure her wishes are known in case of sudden health changes. - Provide advanced directive paperwork for completion.  Follow-up Requires follow-up for chronic disease management and to review test results. - Follow up in 2-3 months for  chronic disease management. - Review blood work and CT scan results once available.      No orders of the defined types were placed in this encounter.   Orders Placed This Encounter  Procedures   CT CHEST LUNG CA SCREEN LOW DOSE W/O CM   CBC with Differential   Comprehensive metabolic panel   Lipid Panel   Vitamin D, 25-hydroxy   TSH   Hemoglobin A1c   Hepatitis C antibody     Follow-up: Return in about 3 months (around 11/14/2023) for chronic disease follow up.  An After Visit Summary was printed and given to the patient.  Windell Moment, MD [facility] [phone]

## 2023-08-14 NOTE — Assessment & Plan Note (Signed)
 History of vitamin D deficiency. On Ergocalciferol supplementation.  Will recheck vitamin D level and make recommendations

## 2023-08-14 NOTE — Patient Instructions (Signed)
 Visit Information  Thank you for taking time to visit with me today. Please don't hesitate to contact me if I can be of assistance to you before our next scheduled telephone appointment.  Following are the goals we discussed today:   Goals Addressed             This Visit's Progress    COMPLETED: RNCM Care Management  Expected Outcome:  Monitor, Self-Manage and Reduce Symptoms of: Depression       Current Barriers:  Knowledge Deficits related to effective management of pain and the impact her pain level has on her depression and stress level Care Coordination needs related to pain control and resources to help with dealing with her depression  in a patient with depression and high stress level Chronic Disease Management support and education needs related to effective management of depression  Lacks caregiver support.  Transportation barriers Grief and loss: Husband passed in 2014 and her son passed in 2022 of lung cancer   Planned Interventions: Evaluation of current treatment plan related to depression  and patient's adherence to plan as established by provider. Reports that she has cut back to 1 pot of coffee daily but states she has no interest in smoking cessation. She is working with [employer]. Advised patient to call the office for changes in mood, anxiety, stress level, depression, or mental health needs Provided education to patient re: resources available for transportation needs, resources for effective management of pain and discomfort, resources for effective management of depression and ongoing support Reviewed medications with patient and discussed compliance.  Provided patient with community resources and other resources to help in management of her chronic conditions educational materials related to effective management of depression Reviewed scheduled/upcoming provider appointments including Care Guide referral for transportation needs. Discussed plans with patient for  ongoing care management follow up and provided patient with direct contact information for care management team Advised patient to discuss changes in her mood, depression, or mental health needs with provider Screening for signs and symptoms of depression related to chronic disease state  Assessed social determinant of health barriers  Symptom Management: Take medications as prescribed   Attend all scheduled provider appointments Call provider office for new concerns or questions  call the Suicide and Crisis Lifeline: 988 call the [place] National Suicide Prevention Lifeline: (518)605-7769 or TTY: 458 476 8854 TTY 786-436-1279) to talk to a trained counselor call 1-800-273-TALK (toll free, 24 hour hotline) if experiencing a Mental Health or Behavioral Health Crisis   Follow Up Plan: No further follow up required: Actively working with LCSW, No further complex care management RN needs at this time.          COMPLETED: RNCM Care Management  Expected Outcome:  Monitor, Self-Manage and Reduce Symptoms of: Falls prevention and Safety       Current Barriers:  Knowledge Deficits related to fall prevention and safety in a patient with multiple chronic conditions Care Coordination needs related to transportation concerns in a patient with frequent falls and chronic pain  Chronic Disease Management support and education needs related to fall prevention and safety Lacks caregiver support.  Transportation barriers  Planned Interventions: Provided  verbal education re: potential causes of falls and Fall prevention strategies.  Reviewed medications and discussed potential side effects of medications such as dizziness and frequent urination. The patient states she is compliant with medications. Does have dizziness at times. Review of changing position slowly and monitoring for drops in blood pressure.  Advised patient of importance  of notifying provider of falls. Review and education provided Assessed  for signs and symptoms of orthostatic hypotension. Review and education provided Assessed for falls since last encounter. No recent falls reported. Assessed patients knowledge of fall risk prevention secondary to previously provided education. Review and education provided.  Provided patient information for fall alert systems Assessed working status of life alert bracelet and patient adherence Advised patient to discuss changes in safety in her environment, new questions or concerns related to her chronic conditions and high fall risk with provider Screening for signs and symptoms of depression related to chronic disease state Assessed social determinant of health barriers  Symptom Management: Take medications as prescribed   Attend all scheduled provider appointments Call provider office for new concerns or questions  call the Suicide and Crisis Lifeline: 988 call the [place] National Suicide Prevention Lifeline: 854-493-7300 or TTY: (682)448-8711 TTY (226) 013-5789) to talk to a trained counselor call 1-800-273-TALK (toll free, 24 hour hotline) if experiencing a Mental Health or Behavioral Health Crisis  Report new falls or safety concerns to the provider  Follow Up Plan: No further follow up required: Patient stable. Does not meet continued criteria for complex case management at this time.         COMPLETED: RNCM Care Management  Expected Outcome:  Monitor, Self-Manage and Reduce Symptoms of: HLD       Current Barriers:  Chronic Disease Management support and education needs related to effective management of HLD Lab Results  Component Value Date   CHOL 155 01/08/2023   HDL 68 01/08/2023   LDLCALC 73 01/08/2023   TRIG 71 01/08/2023   CHOLHDL 2.3 01/08/2023     Planned Interventions: Provider established cholesterol goals reviewed. The patient is at goal. Denies any acute change. Counseled on importance of regular laboratory monitoring as prescribed. Labs pending at this  time. Provided HLD educational materials; Reviewed role and benefits of statin for ASCVD risk reduction. The patient is compliant with her Rosuvastatin 10 mg as directed; Discussed strategies to manage statin-induced myalgias. Denies myalgias Reviewed importance of limiting foods high in cholesterol. Review of heart healthy diet.  Screening for signs and symptoms of depression related to chronic disease state;  Assessed social determinant of health barriers;   Symptom Management: Take medications as prescribed   Attend all scheduled provider appointments Attend church or other social activities Perform all self care activities independently  Perform IADL's (shopping, preparing meals, housekeeping, managing finances) independently Call provider office for new concerns or questions  Work with the social worker to address care coordination needs and will continue to work with the clinical team to address health care and disease management related needs call the Suicide and Crisis Lifeline: 988 call the [place] National Suicide Prevention Lifeline: 6670540004 or TTY: 401-330-5704 TTY 727-467-0411) to talk to a trained counselor call 1-800-273-TALK (toll free, 24 hour hotline) if experiencing a Mental Health or Behavioral Health Crisis  - call for medicine refill 2 or 3 days before it runs out - take all medications exactly as prescribed - call doctor with any symptoms you believe are related to your medicine - call doctor when you experience any new symptoms - go to all doctor appointments as scheduled - adhere to prescribed diet: heart healthy/ADA diet   Follow Up Plan: No further follow up required: Patient stable, does not meet criteria for continued patient outreach at this time.              Please call the care guide  team at [phone] if you need to cancel or reschedule your appointment.   If you are experiencing a Mental Health or Behavioral Health Crisis or need someone  to talk to, please call the Suicide and Crisis Lifeline: 988 call the [place] National Suicide Prevention Lifeline: 203 627 5893 or TTY: 347-829-1517 TTY 260-618-8513) to talk to a trained counselor call 1-800-273-TALK (toll free, 24 hour hotline)   Patient verbalizes understanding of instructions and care plan provided today and agrees to view in MyChart. Active MyChart status and patient understanding of how to access instructions and care plan via MyChart confirmed with patient.     No further follow up required:    Larey Brick, BSN RN [facility], [facility] RN Care Manager Direct Dial: [phone]  Fax: [phone]

## 2023-08-14 NOTE — Patient Instructions (Signed)
 VISIT SUMMARY:  Today, we discussed your severe chronic pain, COPD, weight loss, depression, and insomnia. We reviewed your current medications and made plans for follow-up care and additional tests.  YOUR PLAN:  -CHRONIC PAIN: Chronic pain is long-lasting pain that can affect various parts of the body. Your current medication, morphine, is not providing adequate relief. You will follow up with your pain management specialist on March 25 to assess the effectiveness of morphine and discuss alternative pain management strategies.  -CHRONIC OBSTRUCTIVE PULMONARY DISEASE (COPD): COPD is a lung condition that makes it hard to breathe. You are using an albuterol inhaler, nebulizer, and Breztri regularly. It is important to address the mold issue in your apartment and to work on quitting smoking to improve your respiratory health.  -WEIGHT LOSS AND NUTRITIONAL CONCERNS: You have experienced weight loss and difficulty eating. You are currently consuming Ensure for additional nutrition. It is important to eat balanced meals, and if weight loss continues, we may consider nutritional counseling.  -DEPRESSION: Depression is a mood disorder that causes persistent feelings of sadness and loss of interest. You are taking Wellbutrin for depression. If your symptoms worsen, we may consider a referral to a psychiatrist.  -INSOMNIA: Insomnia is difficulty falling or staying asleep. Your current sleeping pills may have reduced effectiveness due to long-term use. We will re-evaluate your sleep medication regimen and consider alternative treatments.  -GENERAL HEALTH MAINTENANCE: You are due for routine blood work and a low-dose chest CT scan for lung cancer screening. Your diabetes is well-controlled with an A1c of 4.6, but you need a diabetic eye exam. We will order a CBC, CMP, and lipid panel, and schedule the chest CT scan and eye exam.  -GOALS OF CARE: We provided you with advanced directive paperwork to ensure your  healthcare wishes are known in case of sudden health changes. Please complete this paperwork.  INSTRUCTIONS:  Please follow up with your pain management specialist on March 25 to assess the effectiveness of morphine. Schedule and complete your routine blood work (CBC, CMP, and lipid panel), low-dose chest CT scan for lung cancer screening, and diabetic eye exam. Follow up in 2-3 months for chronic disease management and to review test results.

## 2023-08-14 NOTE — Patient Outreach (Signed)
 Care Management   Visit Note  08/14/2023 Name: [name] [name] MRN: [id number] DOB: 1946/02/26  Subjective: [name] [name] is a 78 y.o. year old female who is a primary care patient of [name] Moment, MD. The Care Management team was consulted for assistance.      Engaged with patient spoke with patient by telephone.    Goals Addressed             This Visit's Progress    COMPLETED: RNCM Care Management  Expected Outcome:  Monitor, Self-Manage and Reduce Symptoms of: Depression       Current Barriers:  Knowledge Deficits related to effective management of pain and the impact her pain level has on her depression and stress level Care Coordination needs related to pain control and resources to help with dealing with her depression  in a patient with depression and high stress level Chronic Disease Management support and education needs related to effective management of depression  Lacks caregiver support.  Transportation barriers Grief and loss: Husband passed in 2014 and her son passed in 2022 of lung cancer   Planned Interventions: Evaluation of current treatment plan related to depression  and patient's adherence to plan as established by provider. Reports that she has cut back to 1 pot of coffee daily but states she has no interest in smoking cessation. She is working with [employer]. Advised patient to call the office for changes in mood, anxiety, stress level, depression, or mental health needs Provided education to patient re: resources available for transportation needs, resources for effective management of pain and discomfort, resources for effective management of depression and ongoing support Reviewed medications with patient and discussed compliance.  Provided patient with community resources and other resources to help in management of her chronic conditions educational materials related to effective management of depression Reviewed scheduled/upcoming provider appointments  including Care Guide referral for transportation needs. Discussed plans with patient for ongoing care management follow up and provided patient with direct contact information for care management team Advised patient to discuss changes in her mood, depression, or mental health needs with provider Screening for signs and symptoms of depression related to chronic disease state  Assessed social determinant of health barriers  Symptom Management: Take medications as prescribed   Attend all scheduled provider appointments Call provider office for new concerns or questions  call the Suicide and Crisis Lifeline: 988 call the [place] National Suicide Prevention Lifeline: 671-221-9431 or TTY: (716)862-1587 TTY (980)098-8878) to talk to a trained counselor call 1-800-273-TALK (toll free, 24 hour hotline) if experiencing a Mental Health or Behavioral Health Crisis   Follow Up Plan: No further follow up required: Actively working with LCSW, No further complex care management RN needs at this time.          COMPLETED: RNCM Care Management  Expected Outcome:  Monitor, Self-Manage and Reduce Symptoms of: Falls prevention and Safety       Current Barriers:  Knowledge Deficits related to fall prevention and safety in a patient with multiple chronic conditions Care Coordination needs related to transportation concerns in a patient with frequent falls and chronic pain  Chronic Disease Management support and education needs related to fall prevention and safety Lacks caregiver support.  Transportation barriers  Planned Interventions: Provided  verbal education re: potential causes of falls and Fall prevention strategies.  Reviewed medications and discussed potential side effects of medications such as dizziness and frequent urination. The patient states she is compliant with medications. Does have dizziness  at times. Review of changing position slowly and monitoring for drops in blood pressure.  Advised  patient of importance of notifying provider of falls. Review and education provided Assessed for signs and symptoms of orthostatic hypotension. Review and education provided Assessed for falls since last encounter. No recent falls reported. Assessed patients knowledge of fall risk prevention secondary to previously provided education. Review and education provided.  Provided patient information for fall alert systems Assessed working status of life alert bracelet and patient adherence Advised patient to discuss changes in safety in her environment, new questions or concerns related to her chronic conditions and high fall risk with provider Screening for signs and symptoms of depression related to chronic disease state Assessed social determinant of health barriers  Symptom Management: Take medications as prescribed   Attend all scheduled provider appointments Call provider office for new concerns or questions  call the Suicide and Crisis Lifeline: 988 call the [place] National Suicide Prevention Lifeline: 701-201-7620 or TTY: 260-826-8194 TTY 517-825-3238) to talk to a trained counselor call 1-800-273-TALK (toll free, 24 hour hotline) if experiencing a Mental Health or Behavioral Health Crisis  Report new falls or safety concerns to the provider  Follow Up Plan: No further follow up required: Patient stable. Does not meet continued criteria for complex case management at this time.         COMPLETED: RNCM Care Management  Expected Outcome:  Monitor, Self-Manage and Reduce Symptoms of: HLD       Current Barriers:  Chronic Disease Management support and education needs related to effective management of HLD Lab Results  Component Value Date   CHOL 155 01/08/2023   HDL 68 01/08/2023   LDLCALC 73 01/08/2023   TRIG 71 01/08/2023   CHOLHDL 2.3 01/08/2023     Planned Interventions: Provider established cholesterol goals reviewed. The patient is at goal. Denies any acute change. Counseled  on importance of regular laboratory monitoring as prescribed. Labs pending at this time. Provided HLD educational materials; Reviewed role and benefits of statin for ASCVD risk reduction. The patient is compliant with her Rosuvastatin 10 mg as directed; Discussed strategies to manage statin-induced myalgias. Denies myalgias Reviewed importance of limiting foods high in cholesterol. Review of heart healthy diet.  Screening for signs and symptoms of depression related to chronic disease state;  Assessed social determinant of health barriers;   Symptom Management: Take medications as prescribed   Attend all scheduled provider appointments Attend church or other social activities Perform all self care activities independently  Perform IADL's (shopping, preparing meals, housekeeping, managing finances) independently Call provider office for new concerns or questions  Work with the social worker to address care coordination needs and will continue to work with the clinical team to address health care and disease management related needs call the Suicide and Crisis Lifeline: 988 call the [place] National Suicide Prevention Lifeline: 704-328-9578 or TTY: (956) 080-7707 TTY 339-023-5642) to talk to a trained counselor call 1-800-273-TALK (toll free, 24 hour hotline) if experiencing a Mental Health or Behavioral Health Crisis  - call for medicine refill 2 or 3 days before it runs out - take all medications exactly as prescribed - call doctor with any symptoms you believe are related to your medicine - call doctor when you experience any new symptoms - go to all doctor appointments as scheduled - adhere to prescribed diet: heart healthy/ADA diet   Follow Up Plan: No further follow up required: Patient stable, does not meet criteria for continued patient outreach at this  time.             Consent to Services:  Patient was given information about care management services, agreed to services, and  gave verbal consent to participate.   Plan: No further follow up required: Patient stable. No further complex case management needs identified.  Larey Brick, BSN RN [facility], [facility] RN Care Manager Direct Dial: [phone]  Fax: [phone]

## 2023-08-15 ENCOUNTER — Other Ambulatory Visit: Payer: Self-pay | Admitting: Family Medicine

## 2023-08-15 DIAGNOSIS — K529 Noninfective gastroenteritis and colitis, unspecified: Secondary | ICD-10-CM

## 2023-08-15 LAB — HEPATITIS C ANTIBODY: Hep C Virus Ab: NONREACTIVE

## 2023-08-15 LAB — CBC WITH DIFFERENTIAL/PLATELET
Basophils Absolute: 0.1 10*3/uL (ref 0.0–0.2)
Basos: 1 %
EOS (ABSOLUTE): 0.1 10*3/uL (ref 0.0–0.4)
Eos: 2 %
Hematocrit: 35.7 % (ref 34.0–46.6)
Hemoglobin: 12.1 g/dL (ref 11.1–15.9)
Immature Grans (Abs): 0 10*3/uL (ref 0.0–0.1)
Immature Granulocytes: 0 %
Lymphocytes Absolute: 2.1 10*3/uL (ref 0.7–3.1)
Lymphs: 32 %
MCH: 32.4 pg (ref 26.6–33.0)
MCHC: 33.9 g/dL (ref 31.5–35.7)
MCV: 96 fL (ref 79–97)
Monocytes Absolute: 0.5 10*3/uL (ref 0.1–0.9)
Monocytes: 7 %
Neutrophils Absolute: 3.9 10*3/uL (ref 1.4–7.0)
Neutrophils: 58 %
Platelets: 249 10*3/uL (ref 150–450)
RBC: 3.74 x10E6/uL — ABNORMAL LOW (ref 3.77–5.28)
RDW: 11.9 % (ref 11.7–15.4)
WBC: 6.7 10*3/uL (ref 3.4–10.8)

## 2023-08-15 LAB — COMPREHENSIVE METABOLIC PANEL
ALT: 9 IU/L (ref 0–32)
AST: 16 IU/L (ref 0–40)
Albumin: 3.8 g/dL (ref 3.8–4.8)
Alkaline Phosphatase: 95 IU/L (ref 44–121)
BUN/Creatinine Ratio: 16 (ref 12–28)
BUN: 9 mg/dL (ref 8–27)
Bilirubin Total: 0.4 mg/dL (ref 0.0–1.2)
CO2: 27 mmol/L (ref 20–29)
Calcium: 9.3 mg/dL (ref 8.7–10.3)
Chloride: 97 mmol/L (ref 96–106)
Creatinine, Ser: 0.56 mg/dL — ABNORMAL LOW (ref 0.57–1.00)
Globulin, Total: 2.1 g/dL (ref 1.5–4.5)
Glucose: 85 mg/dL (ref 70–99)
Potassium: 3.8 mmol/L (ref 3.5–5.2)
Sodium: 138 mmol/L (ref 134–144)
Total Protein: 5.9 g/dL — ABNORMAL LOW (ref 6.0–8.5)
eGFR: 94 mL/min/{1.73_m2} (ref >59)

## 2023-08-15 LAB — LIPID PANEL
Chol/HDL Ratio: 2.2 ratio (ref 0.0–4.4)
Cholesterol, Total: 124 mg/dL (ref 100–199)
HDL: 56 mg/dL (ref >39)
LDL Chol Calc (NIH): 57 mg/dL (ref 0–99)
Triglycerides: 44 mg/dL (ref 0–149)
VLDL Cholesterol Cal: 11 mg/dL (ref 5–40)

## 2023-08-15 LAB — HEMOGLOBIN A1C
Est. average glucose Bld gHb Est-mCnc: 105 mg/dL
Hgb A1c MFr Bld: 5.3 % (ref 4.8–5.6)

## 2023-08-15 LAB — TSH: TSH: 1.03 u[IU]/mL (ref 0.450–4.500)

## 2023-08-15 LAB — VITAMIN D 25 HYDROXY (VIT D DEFICIENCY, FRACTURES): Vit D, 25-Hydroxy: 62.2 ng/mL (ref 30.0–100.0)

## 2023-08-17 ENCOUNTER — Other Ambulatory Visit: Payer: Self-pay

## 2023-08-17 NOTE — Telephone Encounter (Signed)
 Copied from CRM [id number]. Topic: Clinical - Medication Refill >> Aug 17, 2023 12:38 PM Higinio Roger wrote: Most Recent Primary Care Visit:  Provider: Windell Moment  Department: [facility]  Visit Type: OFFICE VISIT  Date: 08/14/2023  Medication: BREZTRI AEROSPHERE 160-9-4.8 MCG/ACT AERO   Has the patient contacted their pharmacy? Yes (Agent: If no, request that the patient contact the pharmacy for the refill. If patient does not wish to contact the pharmacy document the reason why and proceed with request.) (Agent: If yes, when and what did the pharmacy advise?) They did not receive it   Is this the correct pharmacy for this prescription? Yes If no, delete pharmacy and type the correct one.  This is the patient's preferred pharmacy:   [facility] [place] [place] [place] - [address] [place] [address] Phone: [phone] Fax: [phone]   Has the prescription been filled recently? Yes  Is the patient out of the medication? Yes  Has the patient been seen for an appointment in the last year OR does the patient have an upcoming appointment? Yes  Can we respond through MyChart? Yes  Agent: Please be advised that Rx refills may take up to 3 business days. We ask that you follow-up with your pharmacy.

## 2023-08-20 ENCOUNTER — Other Ambulatory Visit: Payer: Self-pay

## 2023-08-20 MED ORDER — BREZTRI AEROSPHERE 160-9-4.8 MCG/ACT IN AERO: INHALATION_SPRAY | RESPIRATORY_TRACT | 10 refills | Status: DC

## 2023-08-20 NOTE — Telephone Encounter (Signed)
 Copied from CRM [id number]. Topic: Clinical - Medication Refill >> Aug 20, 2023  1:15 PM Clayton Bibles wrote: Most Recent Primary Care Visit:  Provider: Windell Moment  Department: [facility]  Visit Type: OFFICE VISIT  Date: 08/14/2023  Medication: BREZTRI AEROSPHERE 160-9-4.8 MCG/ACT AERO  Has the patient contacted their pharmacy? Yes (Agent: If no, request that the patient contact the pharmacy for the refill. If patient does not wish to contact the pharmacy document the reason why and proceed with request.) (Agent: If yes, when and what did the pharmacy advise?) Pharmacy needs a order before refilling medication Is this the correct pharmacy for this prescription? Yes If no, delete pharmacy and type the correct one.  This is the patient's preferred pharmacy:   [facility] [place], [place] - [address] [place] [address] Phone: [phone] Fax: [phone]   Has the prescription been filled recently? No  Is the patient out of the medication? No  Has the patient been seen for an appointment in the last year OR does the patient have an upcoming appointment? Yes  Can we respond through MyChart? No  Agent: Please be advised that Rx refills may take up to 3 business days. We ask that you follow-up with your pharmacy.

## 2023-08-22 ENCOUNTER — Inpatient Hospital Stay (HOSPITAL_BASED_OUTPATIENT_CLINIC_OR_DEPARTMENT_OTHER): Admission: RE | Admit: 2023-08-22 | Source: Ambulatory Visit | Admitting: Radiology

## 2023-08-23 ENCOUNTER — Other Ambulatory Visit: Payer: Self-pay | Admitting: Family Medicine

## 2023-08-23 ENCOUNTER — Other Ambulatory Visit: Payer: Self-pay

## 2023-08-23 DIAGNOSIS — J410 Simple chronic bronchitis: Secondary | ICD-10-CM

## 2023-09-04 ENCOUNTER — Other Ambulatory Visit: Payer: Self-pay | Admitting: Family Medicine

## 2023-09-04 DIAGNOSIS — K529 Noninfective gastroenteritis and colitis, unspecified: Secondary | ICD-10-CM

## 2023-09-06 DIAGNOSIS — M51369 Other intervertebral disc degeneration, lumbar region without mention of lumbar back pain or lower extremity pain: Secondary | ICD-10-CM | POA: Diagnosis not present

## 2023-09-06 DIAGNOSIS — M47816 Spondylosis without myelopathy or radiculopathy, lumbar region: Secondary | ICD-10-CM | POA: Diagnosis not present

## 2023-09-06 DIAGNOSIS — M48061 Spinal stenosis, lumbar region without neurogenic claudication: Secondary | ICD-10-CM | POA: Diagnosis not present

## 2023-09-06 DIAGNOSIS — M545 Low back pain, unspecified: Secondary | ICD-10-CM | POA: Diagnosis not present

## 2023-09-07 ENCOUNTER — Other Ambulatory Visit: Admitting: Licensed Clinical Social Worker

## 2023-09-07 ENCOUNTER — Encounter: Payer: Self-pay | Admitting: Licensed Clinical Social Worker

## 2023-09-10 ENCOUNTER — Ambulatory Visit: Payer: Self-pay

## 2023-09-10 NOTE — Telephone Encounter (Signed)
 Copied from CRM [id number]. Topic: Clinical - Red Word Triage >> Sep 10, 2023 12:34 PM Shelah Lewandowsky wrote: Red Word that prompted transfer to Nurse Triage: pain in both feet with swelling in left leg, right leg is a little swollen- pain level 10  Chief Complaint: Leg swelling Symptoms: Right foot pain and redness Frequency: A month Pertinent Negatives: Patient denies fever Disposition: [] ED /[] Urgent Care (no appt availability in office) / [x] Appointment(In office/virtual)/ []  [facility]/ [] Home Care/ [x] Refused Recommended Disposition /[] [facility]/ []  Follow-up with PCP Additional Notes: Patient called in to report moderate swelling in bilateral legs. Patient stated swelling has been ongoing about a month. Patient stated swelling extends from foot to below the knee. Patient stated right side is more swollen than left side. Patient stated her right foot is painful and appears red. Patient denied fever, chest pain and SOB. Advised patient to see provider within 24 hours, per protocol. Patient declined because she needs to arrange transportation 3 days in advance. Patient is requesting appointment for later in the week. Advised patient that I would route this conversation to clinic for their discretion on scheduling outside of recommended timeframe. Patient would like a call back for scheduling. Please advise.   Reason for Disposition  [1] MODERATE leg swelling (e.g., swelling extends up to knees) AND [2] new-onset or worsening  Answer Assessment - Initial Assessment Questions 1. ONSET: "When did the swelling start?" (e.g., minutes, hours, days)     About a month ago 2. LOCATION: "What part of the leg is swollen?"  "Are both legs swollen or just one leg?"     Bilateral swelling from below knee to foot, right side is worse 3. SEVERITY: "How bad is the swelling?" (e.g., localized; mild, moderate, severe)   - Localized: Small area of swelling localized to one leg.   - MILD pedal  edema: Swelling limited to foot and ankle, pitting edema < 1/4 inch (6 mm) deep, rest and elevation eliminate most or all swelling.   - MODERATE edema: Swelling of lower leg to knee, pitting edema > 1/4 inch (6 mm) deep, rest and elevation only partially reduce swelling.   - SEVERE edema: Swelling extends above knee, facial or hand swelling present.      Moderate 4. REDNESS: "Does the swelling look red or infected?"     States right foot is red in mornings 5. PAIN: "Is the swelling painful to touch?" If Yes, ask: "How painful is it?"   (Scale 1-10; mild, moderate or severe)     States right food is painful, rates pain a 10/10, states she is having a hard time moving foot around due to pain 6. FEVER: "Do you have a fever?" If Yes, ask: "What is it, how was it measured, and when did it start?"      Denies fever 7. CAUSE: "What do you think is causing the leg swelling?"     Unknown 8. MEDICAL HISTORY: "Do you have a history of blood clots (e.g., DVT), cancer, heart failure, kidney disease, or liver failure?"     Denies history of blood clots 9. RECURRENT SYMPTOM: "Have you had leg swelling before?" If Yes, ask: "When was the last time?" "What happened that time?"     Denies 10. OTHER SYMPTOMS: "Do you have any other symptoms?" (e.g., chest pain, difficulty breathing)     Difficulty sleeping, denies chest pain, denies worsening SOB (history of COPD)  Protocols used: Leg Swelling and Edema-A-AH

## 2023-09-10 NOTE — Telephone Encounter (Signed)
 E2C2 called and stated patient called back to make appointment and that she did not know how to proceed with scheduling. I told her Dr. Sirivol is out of the office this week, to schedule appointment with another provider per patient preference.

## 2023-09-14 ENCOUNTER — Ambulatory Visit (HOSPITAL_BASED_OUTPATIENT_CLINIC_OR_DEPARTMENT_OTHER): Admitting: Radiology

## 2023-09-14 ENCOUNTER — Encounter: Payer: Self-pay | Admitting: Physician Assistant

## 2023-09-14 ENCOUNTER — Ambulatory Visit (INDEPENDENT_AMBULATORY_CARE_PROVIDER_SITE_OTHER): Admitting: Physician Assistant

## 2023-09-14 VITALS — BP 128/64 | HR 55 | Temp 97.2°F | Ht 62.0 in | Wt 125.0 lb

## 2023-09-14 DIAGNOSIS — G894 Chronic pain syndrome: Secondary | ICD-10-CM | POA: Diagnosis not present

## 2023-09-14 DIAGNOSIS — R600 Localized edema: Secondary | ICD-10-CM | POA: Diagnosis not present

## 2023-09-14 NOTE — Progress Notes (Signed)
 Subjective:  Patient ID: [name] [name], female    DOB: [birth year]  Age: 78 y.o. MRN: [id number]  No chief complaint on file.   Patient is her for both swelling ankles since around 3 weeks. She mentioned that her right foot and ankle is painful due to the swelling.      08/14/2023   10:59 AM 08/01/2022    3:48 PM 06/26/2022   11:09 AM 02/14/2022    2:55 PM 11/03/2021   11:29 AM  Depression screen PHQ 2/9  Decreased Interest 0 0 0 1 1  Down, Depressed, Hopeless 0 1 1 2 1   PHQ - 2 Score 0 1 1 3 2   Altered sleeping 3 3 3 2 3   Tired, decreased energy 3 3 3 3 3   Change in appetite 2 2 2 3 3   Feeling bad or failure about yourself  0 0 0 2 0  Trouble concentrating 3 2 2 2 3   Moving slowly or fidgety/restless 0 1 1 2 1   Suicidal thoughts 0 0 0 0 0  PHQ-9 Score 11 12 12 17 15   Difficult doing work/chores Not difficult at all Not difficult at all Not difficult at all Somewhat difficult Not difficult at all        08/14/2023    2:05 PM  Fall Risk   Number falls in past yr: 1  Injury with Fall? 0  Risk for fall due to : History of fall(s);Impaired balance/gait    Patient Care Team: Sirivol, Mamatha, MD as PCP - General (Family Medicine) Tobb, Kardie, DO as PCP - Cardiology (Cardiology) Jens Molder, LCSW as Social Worker (Licensed Clinical Social Worker)   Review of Systems  Constitutional:  Negative for chills, fatigue and fever.  HENT:  Negative for congestion, ear pain and sore throat.   Respiratory:  Positive for shortness of breath. Negative for cough.   Cardiovascular:  Positive for leg swelling. Negative for chest pain and palpitations.  Gastrointestinal:  Negative for abdominal pain, constipation, diarrhea, nausea and vomiting.  Endocrine: Negative for polydipsia, polyphagia and polyuria.  Genitourinary:  Negative for difficulty urinating and dysuria.  Musculoskeletal:  Negative for arthralgias, back pain and myalgias.  Skin:  Negative for rash.  Neurological:  Negative  for headaches.  Psychiatric/Behavioral:  Negative for dysphoric mood. The patient is not nervous/anxious.     Current Outpatient Medications on File Prior to Visit  Medication Sig Dispense Refill   potassium chloride  (KLOR-CON ) 10 MEQ tablet Take 10 mEq by mouth daily.     albuterol  (PROVENTIL ) (2.5 MG/3ML) 0.083% nebulizer solution Inhale 1 vial via nebulizer every 6 hours as needed for wheezing or shortness of breath 90 mL 11   albuterol  (VENTOLIN  HFA) 108 (90 Base) MCG/ACT inhaler Inhale 2 puffs by mouth every 6 hours as needed for shortness of breath or wheezing 6.7 each 11   aspirin  EC 81 MG tablet Take 1 tablet (81 mg total) by mouth daily. Swallow whole. 90 tablet 3   budeson-glycopyrrolate -formoterol  (BREZTRI  AEROSPHERE) 160-9-4.8 MCG/ACT AERO INHALE TWO (2) PUFFS BY MOUTH INTO THE LUNGS TWICE DAILY AS NEEDED FOR SHORTNESS OF BREATH 10.7 g 10   buPROPion  (WELLBUTRIN  XL) 150 MG 24 hr tablet TAKE 1 TABLET BY MOUTH EVERY MORNING 90 tablet 1   cloNIDine  (CATAPRES ) 0.1 MG tablet TAKE ONE (1) TABLET BY MOUTH TWICE DAILY 60 tablet 10   finasteride  (PROPECIA ) 1 MG tablet TAKE ONE TABLET BY MOUTH DAILY 30 tablet 3   hydrOXYzine  (VISTARIL ) 25 MG  capsule Take 1 capsule (25 mg total) by mouth every 8 (eight) hours as needed. 30 capsule 0   isosorbide  mononitrate (IMDUR ) 30 MG 24 hr tablet Take 1 tablet by mouth every day 30 tablet 11   lisinopril -hydrochlorothiazide  (ZESTORETIC ) 20-12.5 MG tablet TAKE TWO (2) TABLETS BY MOUTH EVERY MORNING 60 tablet 10   meclizine  (ANTIVERT ) 25 MG tablet TAKE 1 TABLET BY MOUTH THREE TIMES DAILY AS NEEDED FOR DIZZINESS *REFILL REQUEST* 180 tablet 1   morphine  (MSIR) 15 MG tablet Take 15 mg by mouth every 8 (eight) hours as needed.     nitroGLYCERIN  (NITROSTAT ) 0.4 MG SL tablet Place 1 tablet (0.4 mg total) under the tongue every 5 (five) minutes as needed for chest pain. 30 tablet 1   omeprazole  (PRILOSEC) 40 MG capsule TAKE 1 CAPSULE BY MOUTH EVERY MORNING 180  capsule 1   oxyCODONE -acetaminophen  (PERCOCET) 7.5-325 MG tablet Take 1 tablet by mouth every 6 (six) hours as needed. (Patient not taking: Reported on 08/14/2023)     potassium chloride  (KLOR-CON  M) 10 MEQ tablet TAKE 1 TABLET BY MOUTH ONCE DAILY 30 tablet 10   promethazine  (PHENERGAN ) 25 MG tablet Take 1 tablet by mouth every 6 hours as needed for nausea and vomiting 120 tablet 2   rosuvastatin  (CRESTOR ) 10 MG tablet TAKE 1 TABLET BY MOUTH EVERY DAY AT BEDTIME 90 tablet 1   traZODone  (DESYREL ) 100 MG tablet TAKE 1 TABLET BY MOUTH DAILY AT BEDTIME 30 tablet 10   Vitamin D , Ergocalciferol , (DRISDOL ) 1.25 MG (50000 UNIT) CAPS capsule TAKE ONE CAPSULE BY MOUTH ONCE WEEKLY ON MONDAY 12 capsule 2   No current facility-administered medications on file prior to visit.   Past Medical History:  Diagnosis Date   Anxiety    Aortic atherosclerosis (HCC)    Ascending aorta dilation (HCC)    a.) TTE 09/02/19 --> mild; measured 39mm.   Asthma    BMI 24.0-24.9, adult 02/14/2022   CAD (coronary artery disease)    a,) CTA 08/24/20 --> mild CAD (25-49%) in RCA, LAD, LCx; CAC/Agatston score 509.   Cataract    Chronic left shoulder pain 09/27/2015   COPD (chronic obstructive pulmonary disease) (HCC)    Coronary artery disease involving native coronary artery of native heart without angina pectoris 11/18/2020   Degenerative joint disease involving multiple joints 09/28/2017   Depression    Depression, major, single episode, severe (HCC) 03/30/2020   Diastolic dysfunction    a.) TTE 09/02/19 --> LVEF 60-65%; G2DD. b.) TTE 07/23/20 --> LVEF 70-75%; G1DD.   Enlarged aorta (HCC)    Essential hypertension 08/07/2019   GERD (gastroesophageal reflux disease)    GERD with esophagitis 08/07/2019   History of kidney stones    Hyperlipidemia    Hypertension    Long-term current use of opiate analgesic 06/25/2017   Lupus    Myocardial infarction (HCC)    Osteoarthritis    PAC (premature atrial contraction)    a.)  Holter 12/04/19 --> occassional; 1.1% PAC burden.   Pain of right hip joint 11/22/2015   Paraesophageal hernia    PAT (paroxysmal atrial tachycardia) (HCC) 02/03/2020   Presbyesophagus    Primary osteoarthritis of left knee 02/04/2015   PSVT (paroxysmal supraventricular tachycardia) (HCC)    a.) Holter 12/04/19 --> 22 runs.   Renal calculus 12/21/2021   S/P repair of paraesophageal hernia 02/22/2021   S/P total knee arthroplasty 02/15/2015   Schatzki's ring    Sciatica 01/31/2022   Tobacco use 08/19/2019   Type  2 diabetes mellitus with other specified complication (HCC) 08/07/2019   Past Surgical History:  Procedure Laterality Date   ABDOMINAL HYSTERECTOMY     BLADDER SUSPENSION     BREAST BIOPSY Right    COLONOSCOPY  09/04/2011   Colonic polyps, status post polyectomy. Incidental small ascending colon lipoma   ESOPHAGOGASTRODUODENOSCOPY  09/08/2015   Schatzki ring status post esophageal dilitation. Small hiatal hernia   hemorrhoid surgery     KNEE SURGERY     left   TOTAL KNEE ARTHROPLASTY Left 02/15/2015   Procedure: TOTAL KNEE ARTHROPLASTY;  Surgeon: Christie [facility], MD;  Location: [facility] OR;  Service: Orthopedics;  Laterality: Left;   TUBAL LIGATION     WRIST SURGERY     right   XI ROBOTIC ASSISTED PARAESOPHAGEAL HERNIA REPAIR N/A 02/22/2021   Procedure: XI ROBOTIC ASSISTED PARAESOPHAGEAL HERNIA REPAIR with RNFA to assist;  Surgeon: Alben Alma, MD;  Location: [facility] ORS;  Service: General;  Laterality: N/A;    Family History  Problem Relation Age of Onset   Stroke Mother    Heart disease Mother    COPD Father    Cancer Father        Bone   Diabetes Father    Heart disease Father    Stroke Father    Lupus Sister    Seizures Son    COPD Son    Colon cancer Neg Hx    Esophageal cancer Neg Hx    Stomach cancer Neg Hx    Rectal cancer Neg Hx    Social History   Socioeconomic History   Marital status: Widowed    Spouse name: Not on file   Number of children: 4   Years of  education: Not on file   Highest education level: Not on file  Occupational History   Occupation: Retired  Tobacco Use   Smoking status: Every Day    Current packs/day: 1.00    Average packs/day: 1 pack/day for 48.0 years (48.0 ttl pk-yrs)    Types: Cigarettes   Smokeless tobacco: Never  Vaping Use   Vaping status: Never Used  Substance and Sexual Activity   Alcohol use: No   Drug use: No   Sexual activity: Not Currently  Other Topics Concern   Not on file  Social History Narrative   Not on file   Social Drivers of Health   Financial Resource Strain: Low Risk  (07/13/2023)   Overall Financial Resource Strain (CARDIA)    Difficulty of Paying Living Expenses: Not very hard  Food Insecurity: No Food Insecurity (07/13/2023)   Hunger Vital Sign    Worried About Running Out of Food in the Last Year: Never true    Ran Out of Food in the Last Year: Never true  Transportation Needs: Unmet Transportation Needs (07/13/2023)   PRAPARE - Transportation    Lack of Transportation (Medical): Yes    Lack of Transportation (Non-Medical): Yes  Physical Activity: Inactive (08/01/2022)   Exercise Vital Sign    Days of Exercise per Week: 0 days    Minutes of Exercise per Session: 0 min  Stress: Stress Concern Present (08/01/2022)   [employer] of Occupational Health - Occupational Stress Questionnaire    Feeling of Stress : To some extent  Social Connections: Socially Isolated (05/11/2022)   Social Connection and Isolation Panel [NHANES]    Frequency of Communication with Friends and Family: More than three times a week    Frequency of Social Gatherings with Friends and Family:  Twice a week    Attends Religious Services: Never    Active Member of Clubs or Organizations: No    Attends Banker Meetings: Never    Marital Status: Widowed    Objective:  BP (!) 110/56   Pulse (!) 55   Temp (!) 97.2 F (36.2 C)   Ht 5\' 2"  (1.575 m)   Wt 125 lb (56.7 kg)   SpO2 93%   BMI  22.86 kg/m      09/14/2023   10:50 AM 08/14/2023   10:55 AM 04/24/2023    2:02 PM  BP/Weight  Systolic BP 110 100 120  Diastolic BP 56 66 68  Wt. (Lbs) 125 125 122.4  BMI 22.86 kg/m2 22.86 kg/m2 22.39 kg/m2    Physical Exam  Diabetic Foot Exam - Simple   No data filed      Lab Results  Component Value Date   WBC 6.7 08/14/2023   HGB 12.1 08/14/2023   HCT 35.7 08/14/2023   PLT 249 08/14/2023   GLUCOSE 85 08/14/2023   CHOL 124 08/14/2023   TRIG 44 08/14/2023   HDL 56 08/14/2023   LDLCALC 57 08/14/2023   ALT 9 08/14/2023   AST 16 08/14/2023   NA 138 08/14/2023   K 3.8 08/14/2023   CL 97 08/14/2023   CREATININE 0.56 (L) 08/14/2023   BUN 9 08/14/2023   CO2 27 08/14/2023   TSH 1.030 08/14/2023   INR 1.02 01/28/2015   HGBA1C 5.3 08/14/2023      Assessment & Plan:  Assessment and Plan       There are no diagnoses linked to this encounter.   No orders of the defined types were placed in this encounter.   No orders of the defined types were placed in this encounter.    Follow-up: No follow-ups on file.   I,Marla I Leal-Borjas,acting as a scribe for [employer], PA.,have documented all relevant documentation on the behalf of Odilia Bennett, PA,as directed by  Odilia Bennett, PA while in the presence of Odilia Bennett, [place].   An After Visit Summary was printed and given to the patient.  Odilia Bennett, [place] [facility] [phone]

## 2023-09-17 ENCOUNTER — Telehealth: Payer: Self-pay

## 2023-09-17 ENCOUNTER — Other Ambulatory Visit: Payer: Self-pay | Admitting: Family Medicine

## 2023-09-17 DIAGNOSIS — F339 Major depressive disorder, recurrent, unspecified: Secondary | ICD-10-CM

## 2023-09-17 NOTE — Telephone Encounter (Signed)
 Copied from CRM [id number]. Topic: General - Other >> Sep 17, 2023 10:44 AM Zipporah Him wrote: Reason for CRM: Patient missed appointment for imaging, she was not aware and stated she did not have any transportation arranged. She is going to call [facility] and see if she can get that rescheduled.

## 2023-09-18 ENCOUNTER — Telehealth: Payer: Self-pay

## 2023-09-18 ENCOUNTER — Emergency Department (HOSPITAL_COMMUNITY)

## 2023-09-18 ENCOUNTER — Encounter (HOSPITAL_COMMUNITY): Payer: Self-pay | Admitting: Emergency Medicine

## 2023-09-18 ENCOUNTER — Emergency Department (HOSPITAL_BASED_OUTPATIENT_CLINIC_OR_DEPARTMENT_OTHER)

## 2023-09-18 ENCOUNTER — Emergency Department (HOSPITAL_COMMUNITY)
Admission: EM | Admit: 2023-09-18 | Discharge: 2023-09-19 | Disposition: A | Discharge status: Home / Self Care | Attending: Emergency Medicine | Admitting: Emergency Medicine

## 2023-09-18 ENCOUNTER — Other Ambulatory Visit: Payer: Self-pay | Admitting: Licensed Clinical Social Worker

## 2023-09-18 ENCOUNTER — Other Ambulatory Visit: Payer: Self-pay

## 2023-09-18 DIAGNOSIS — R6889 Other general symptoms and signs: Secondary | ICD-10-CM | POA: Diagnosis not present

## 2023-09-18 DIAGNOSIS — M85871 Other specified disorders of bone density and structure, right ankle and foot: Secondary | ICD-10-CM | POA: Diagnosis not present

## 2023-09-18 DIAGNOSIS — J449 Chronic obstructive pulmonary disease, unspecified: Secondary | ICD-10-CM | POA: Diagnosis not present

## 2023-09-18 DIAGNOSIS — M79671 Pain in right foot: Secondary | ICD-10-CM | POA: Diagnosis not present

## 2023-09-18 DIAGNOSIS — E119 Type 2 diabetes mellitus without complications: Secondary | ICD-10-CM | POA: Diagnosis not present

## 2023-09-18 DIAGNOSIS — M79672 Pain in left foot: Secondary | ICD-10-CM | POA: Diagnosis not present

## 2023-09-18 DIAGNOSIS — Z7902 Long term (current) use of antithrombotics/antiplatelets: Secondary | ICD-10-CM | POA: Diagnosis not present

## 2023-09-18 DIAGNOSIS — M85872 Other specified disorders of bone density and structure, left ankle and foot: Secondary | ICD-10-CM | POA: Diagnosis not present

## 2023-09-18 DIAGNOSIS — R600 Localized edema: Secondary | ICD-10-CM | POA: Diagnosis present

## 2023-09-18 DIAGNOSIS — R609 Edema, unspecified: Secondary | ICD-10-CM | POA: Diagnosis not present

## 2023-09-18 DIAGNOSIS — R262 Difficulty in walking, not elsewhere classified: Secondary | ICD-10-CM | POA: Diagnosis not present

## 2023-09-18 DIAGNOSIS — M7731 Calcaneal spur, right foot: Secondary | ICD-10-CM | POA: Diagnosis not present

## 2023-09-18 DIAGNOSIS — I998 Other disorder of circulatory system: Secondary | ICD-10-CM

## 2023-09-18 DIAGNOSIS — I739 Peripheral vascular disease, unspecified: Secondary | ICD-10-CM

## 2023-09-18 DIAGNOSIS — M79661 Pain in right lower leg: Secondary | ICD-10-CM | POA: Diagnosis present

## 2023-09-18 DIAGNOSIS — M7989 Other specified soft tissue disorders: Secondary | ICD-10-CM | POA: Diagnosis not present

## 2023-09-18 DIAGNOSIS — Z7982 Long term (current) use of aspirin: Secondary | ICD-10-CM | POA: Diagnosis not present

## 2023-09-18 DIAGNOSIS — M7732 Calcaneal spur, left foot: Secondary | ICD-10-CM | POA: Diagnosis not present

## 2023-09-18 DIAGNOSIS — M79604 Pain in right leg: Secondary | ICD-10-CM | POA: Diagnosis not present

## 2023-09-18 DIAGNOSIS — M79605 Pain in left leg: Secondary | ICD-10-CM | POA: Diagnosis not present

## 2023-09-18 DIAGNOSIS — R202 Paresthesia of skin: Secondary | ICD-10-CM | POA: Diagnosis not present

## 2023-09-18 LAB — CBC WITH DIFFERENTIAL/PLATELET
Abs Immature Granulocytes: 0.02 10*3/uL (ref 0.00–0.07)
Basophils Absolute: 0.1 10*3/uL (ref 0.0–0.1)
Basophils Relative: 1 %
Eosinophils Absolute: 0.1 10*3/uL (ref 0.0–0.5)
Eosinophils Relative: 1 %
HCT: 35.7 % — ABNORMAL LOW (ref 36.0–46.0)
Hemoglobin: 12 g/dL (ref 12.0–15.0)
Immature Granulocytes: 0 %
Lymphocytes Relative: 30 %
Lymphs Abs: 1.8 10*3/uL (ref 0.7–4.0)
MCH: 32.6 pg (ref 26.0–34.0)
MCHC: 33.6 g/dL (ref 30.0–36.0)
MCV: 97 fL (ref 80.0–100.0)
Monocytes Absolute: 0.5 10*3/uL (ref 0.1–1.0)
Monocytes Relative: 8 %
Neutro Abs: 3.7 10*3/uL (ref 1.7–7.7)
Neutrophils Relative %: 60 %
Platelets: 184 10*3/uL (ref 150–400)
RBC: 3.68 MIL/uL — ABNORMAL LOW (ref 3.87–5.11)
RDW: 13.6 % (ref 11.5–15.5)
WBC: 6.2 10*3/uL (ref 4.0–10.5)
nRBC: 0 % (ref 0.0–0.2)

## 2023-09-18 LAB — BRAIN NATRIURETIC PEPTIDE: B Natriuretic Peptide: 141.5 pg/mL — ABNORMAL HIGH (ref 0.0–100.0)

## 2023-09-18 MED ORDER — OXYCODONE-ACETAMINOPHEN 5-325 MG PO TABS
2.0000 | ORAL_TABLET | Freq: Once | ORAL | Status: AC
  Administered 2023-09-18: 2 via ORAL
  Filled 2023-09-18: qty 2

## 2023-09-18 NOTE — Patient Outreach (Signed)
 Complex Care Management   Visit Note  09/18/2023  Name:  [name] [name] MRN: [id number] DOB: [birth year]  Situation: Referral received for Complex Care Management related to [facility]/Behavioral Health diagnosis MDD  I obtained verbal consent from Patient.  Visit completed with patient.  on the phone  Background:   Past Medical History:  Diagnosis Date   Anxiety    Aortic atherosclerosis (HCC)    Ascending aorta dilation (HCC)    a.) TTE 09/02/19 --> mild; measured 39mm.   Asthma    BMI 24.0-24.9, adult 02/14/2022   CAD (coronary artery disease)    a,) CTA 08/24/20 --> mild CAD (25-49%) in RCA, LAD, LCx; CAC/Agatston score 509.   Cataract    Chronic left shoulder pain 09/27/2015   COPD (chronic obstructive pulmonary disease) (HCC)    Coronary artery disease involving native coronary artery of native heart without angina pectoris 11/18/2020   Degenerative joint disease involving multiple joints 09/28/2017   Depression    Depression, major, single episode, severe (HCC) 03/30/2020   Diastolic dysfunction    a.) TTE 09/02/19 --> LVEF 60-65%; G2DD. b.) TTE 07/23/20 --> LVEF 70-75%; G1DD.   Enlarged aorta (HCC)    Essential hypertension 08/07/2019   GERD (gastroesophageal reflux disease)    GERD with esophagitis 08/07/2019   History of kidney stones    Hyperlipidemia    Hypertension    Long-term current use of opiate analgesic 06/25/2017   Lupus    Myocardial infarction (HCC)    Osteoarthritis    PAC (premature atrial contraction)    a.) Holter 12/04/19 --> occassional; 1.1% PAC burden.   Pain of right hip joint 11/22/2015   Paraesophageal hernia    PAT (paroxysmal atrial tachycardia) (HCC) 02/03/2020   Presbyesophagus    Primary osteoarthritis of left knee 02/04/2015   PSVT (paroxysmal supraventricular tachycardia) (HCC)    a.) Holter 12/04/19 --> 22 runs.   Renal calculus 12/21/2021   S/P repair of paraesophageal hernia 02/22/2021   S/P total knee arthroplasty 02/15/2015   Schatzki's ring     Sciatica 01/31/2022   Tobacco use 08/19/2019   Type 2 diabetes mellitus with other specified complication (HCC) 08/07/2019    Assessment: Patient Reported Symptoms:  Cognitive Cognitive Status: Alert and oriented to person, place, and time, Normal speech and language skills Cognitive/Intellectual Conditions Management [RPT]: None reported or documented in medical history or problem list   Health Maintenance Behaviors: Annual physical exam, Stress management Healing Pattern: Unsure Health Facilitated by: Pain control, Stress management, Rest  Neurological Neurological Review of Symptoms: No symptoms reported    HEENT HEENT Symptoms Reported: No symptoms reported      Cardiovascular Cardiovascular Symptoms Reported: Swelling in legs or feet (CT SCan ordered for later in the week - unsure of cause)    Respiratory Respiratory Symptoms Reported: No symptoms reported    Endocrine Patient reports the following symptoms related to hypoglycemia or hyperglycemia : No symptoms reported Is patient diabetic?: No    Gastrointestinal Gastrointestinal Symptoms Reported: Abdominal pain or discomfort, Cramping Additional Gastrointestinal Details: Since after having hernia surgey - encouraged to discuss after taking care of leg swelling.      Genitourinary Genitourinary Symptoms Reported: No symptoms reported    Integumentary Integumentary Symptoms Reported: No symptoms reported    Musculoskeletal Musculoskelatal Symptoms Reviewed: Difficulty walking Additional Musculoskeletal Details: Leg Swelling        Psychosocial Psychosocial Symptoms Reported: Sadness - if selected complete PHQ 2-9 Additional Psychological Details: Did not want to discuss  depression due to leg pain. Behavioral Health Conditions: Depression Behavioral Management Strategies: Counseling, Medication therapy, Support system, Coping strategies Behavioral Health Self-Management Outcome: 3 (uncertain) Major  Change/Loss/Stressor/Fears (CP): Medical condition, self Techniques to Cope with Loss/Stress/Change: Counseling, Meditation, Spiritual practice(s) Quality of Family Relationships: helpful, involved      08/14/2023   10:59 AM  Depression screen PHQ 2/9  Decreased Interest 0  Down, Depressed, Hopeless 0  PHQ - 2 Score 0  Altered sleeping 3  Tired, decreased energy 3  Change in appetite 2  Feeling bad or failure about yourself  0  Trouble concentrating 3  Moving slowly or fidgety/restless 0  Suicidal thoughts 0  PHQ-9 Score 11  Difficult doing work/chores Not difficult at all    There were no vitals filed for this visit.  Medications Reviewed Today   Medications were not reviewed in this encounter     Recommendation:   Utilize coping skills to help with pain management - meditation, breathing exercises and distraction. Contact provider with worsening symptoms. Continue taking medications as directed.  Follow Up Plan:   Telephone follow up appointment date/time:  10/09/2023  Hale Level, LCSW [place], [facility] Licensed Clinical Social Worker Care Coordinator [phone]

## 2023-09-18 NOTE — ED Provider Triage Note (Signed)
 Emergency Medicine Provider Triage Evaluation Note  [name] [name] , a 78 y.o. female  was evaluated in triage.  Pt complains of bilateral leg pain. R leg is worse, pain on the bottom and hurts to stand. Starting to have some swelling in the L leg. Been going on for 2-3 weeks. No recent injuries. Having numbness in the toes of the R foot.  Review of Systems  Positive: Bilateral foot pain, L leg swelling Negative: Weakness  Physical Exam  BP 113/60 (BP Location: Right Arm)   Pulse (!) 52   Temp 98.7 F (37.1 C) (Oral)   Resp 16   SpO2 95%  Gen:   Awake, no distress   Resp:  Normal effort  MSK:   Moves extremities without difficulty  Other:  Palpable DP pulses, diffuse tednerness to bilateral feet, no open wounds. 1+ edema to L calf  Medical Decision Making  Medically screening exam initiated at 4:59 PM.  Appropriate orders placed.  [name] [name] was informed that the remainder of the evaluation will be completed by another provider, this initial triage assessment does not replace that evaluation, and the importance of remaining in the ED until their evaluation is complete.     Kingsley, Deakin Lacek K, DO 09/18/23 1704

## 2023-09-18 NOTE — Patient Instructions (Signed)
 Visit Information  Thank you for taking time to visit with me today. Please don't hesitate to contact me if I can be of assistance to you before our next scheduled appointment.  Your next care management appointment is by telephone on 10/09/2023 at 9AM    Please call the care guide team at [phone] if you need to cancel, schedule, or reschedule an appointment.   Please call the Suicide and Crisis Lifeline: 988 if you are experiencing a Mental Health or Behavioral Health Crisis or need someone to talk to.  Hale Level, LCSW [place], [facility] Licensed Clinical Social Worker Care Coordinator [phone]

## 2023-09-18 NOTE — Telephone Encounter (Signed)
 Called patient to schedule AWV, She asked me to call back on Wednesday 09/19/2023 around 10 or 10:30 to schedule.

## 2023-09-18 NOTE — Telephone Encounter (Signed)
 Copied from CRM [id number]. Topic: General - Other >> Sep 18, 2023  2:37 PM Donald Frost wrote: Reason for CRM: The patient called in stating she was seen on Friday but the swelling in her legs has not gotten better and she cannot wait until Thursday to get her scans done. She is calling EMS and heading to [facility]. She wants her provider to know she will be on the way to the hospital shortly.

## 2023-09-18 NOTE — ED Triage Notes (Signed)
 Pt BIB by EMS for bilateral lower extremity numbness. Hx of CHF. Endorses intermittent swelling and pain with ambulation.

## 2023-09-19 ENCOUNTER — Emergency Department (HOSPITAL_BASED_OUTPATIENT_CLINIC_OR_DEPARTMENT_OTHER)

## 2023-09-19 ENCOUNTER — Telehealth: Payer: Self-pay

## 2023-09-19 ENCOUNTER — Other Ambulatory Visit: Payer: Self-pay

## 2023-09-19 ENCOUNTER — Emergency Department (HOSPITAL_COMMUNITY)

## 2023-09-19 DIAGNOSIS — I70223 Atherosclerosis of native arteries of extremities with rest pain, bilateral legs: Secondary | ICD-10-CM

## 2023-09-19 DIAGNOSIS — I998 Other disorder of circulatory system: Secondary | ICD-10-CM | POA: Diagnosis not present

## 2023-09-19 DIAGNOSIS — I739 Peripheral vascular disease, unspecified: Secondary | ICD-10-CM | POA: Diagnosis not present

## 2023-09-19 DIAGNOSIS — M79604 Pain in right leg: Secondary | ICD-10-CM | POA: Diagnosis not present

## 2023-09-19 DIAGNOSIS — I70221 Atherosclerosis of native arteries of extremities with rest pain, right leg: Secondary | ICD-10-CM | POA: Diagnosis not present

## 2023-09-19 LAB — BASIC METABOLIC PANEL WITH GFR
Anion gap: 10 (ref 5–15)
BUN: 11 mg/dL (ref 8–23)
CO2: 27 mmol/L (ref 22–32)
Calcium: 9.2 mg/dL (ref 8.9–10.3)
Chloride: 95 mmol/L — ABNORMAL LOW (ref 98–111)
Creatinine, Ser: 0.66 mg/dL (ref 0.44–1.00)
GFR, Estimated: >60 mL/min (ref >60)
Glucose, Bld: 82 mg/dL (ref 70–99)
Potassium: 3.2 mmol/L — ABNORMAL LOW (ref 3.5–5.1)
Sodium: 132 mmol/L — ABNORMAL LOW (ref 135–145)

## 2023-09-19 LAB — VAS US ABI WITH/WO TBI
Left ABI: 0.84
Right ABI: 0.29

## 2023-09-19 LAB — CBG MONITORING, ED: Glucose-Capillary: 84 mg/dL (ref 70–99)

## 2023-09-19 MED ORDER — MORPHINE SULFATE (PF) 4 MG/ML IV SOLN: 4.0000 mg | Freq: Once | INTRAVENOUS | Status: DC

## 2023-09-19 MED ORDER — POTASSIUM CHLORIDE CRYS ER 20 MEQ PO TBCR
40.0000 meq | EXTENDED_RELEASE_TABLET | Freq: Once | ORAL | Status: AC
  Administered 2023-09-19: 40 meq via ORAL
  Filled 2023-09-19: qty 2

## 2023-09-19 MED ORDER — MORPHINE SULFATE (PF) 4 MG/ML IV SOLN
4.0000 mg | Freq: Once | INTRAVENOUS | Status: AC
  Administered 2023-09-19: 4 mg via INTRAVENOUS
  Filled 2023-09-19: qty 1

## 2023-09-19 MED ORDER — CLOPIDOGREL BISULFATE 75 MG PO TABS: 75.0000 mg | ORAL_TABLET | Freq: Every day | ORAL | 0 refills | Status: DC

## 2023-09-19 NOTE — Progress Notes (Addendum)
 VASCULAR LAB    Right lower extremity arterial duplex has been performed.  See CV proc for preliminary results.  Relayed critical findings to Dr. Rinda Cheers, [facility], RVT 09/19/2023, 11:55 AM

## 2023-09-19 NOTE — Progress Notes (Signed)
 VASCULAR LAB    ABI has been performed.  See CV proc for preliminary results.   Rochanda Harpham, RVT 09/19/2023, 11:54 AM

## 2023-09-19 NOTE — ED Notes (Signed)
 Patient denies pain and is resting comfortably.

## 2023-09-19 NOTE — ED Notes (Signed)
 Patient Alert and oriented to baseline. Stable and ambulatory to baseline. Patient verbalized understanding of the discharge instructions.  Patient belongings were taken by the patient.

## 2023-09-19 NOTE — ED Provider Notes (Signed)
 [facility] Provider Note   CSN: [id number] Arrival date & time: 09/18/23  1603     History  Chief Complaint  Patient presents with   Numbness   Leg Pain    [name] [name] is a 78 y.o. female with a history of chronic pain syndrome, MI, COPD, and type 2 diabetes mellitus who presents to the ED today for leg pain and swelling. Patient endorses pain and swelling to the lower legs and feet for the past 4 weeks. Pain is worse with standing and ambulation. Has numbness to her right toes as well. She has been seen by her PCP for this recently. Denies any new or worsening chest pain or shortness of breath. No additional complaints or concerns at this time.    Home Medications Prior to Admission medications   Medication Sig Start Date End Date Taking? Authorizing Provider  clopidogrel  (PLAVIX ) 75 MG tablet Take 1 tablet (75 mg total) by mouth daily. 09/19/23 10/19/23 Yes Sonnie Dusky, PA-C  albuterol  (PROVENTIL ) (2.5 MG/3ML) 0.083% nebulizer solution Inhale 1 vial via nebulizer every 6 hours as needed for wheezing or shortness of breath 08/23/23   Sirivol, Mamatha, MD  albuterol  (VENTOLIN  HFA) 108 (90 Base) MCG/ACT inhaler Inhale 2 puffs by mouth every 6 hours as needed for shortness of breath or wheezing 08/23/23   Sirivol, Mamatha, MD  aspirin  EC 81 MG tablet Take 1 tablet (81 mg total) by mouth daily. Swallow whole. 03/04/21   Tobb, Kardie, DO  budeson-glycopyrrolate -formoterol  (BREZTRI  AEROSPHERE) 160-9-4.8 MCG/ACT AERO INHALE TWO (2) PUFFS BY MOUTH INTO THE LUNGS TWICE DAILY AS NEEDED FOR SHORTNESS OF BREATH 08/20/23   Sirivol, Mamatha, MD  buPROPion  (WELLBUTRIN  XL) 150 MG 24 hr tablet Take 1 tablet by mouth every morning 09/17/23   Sirivol, Mamatha, MD  cloNIDine  (CATAPRES ) 0.1 MG tablet TAKE ONE (1) TABLET BY MOUTH TWICE DAILY 07/31/23   Sirivol, Mamatha, MD  finasteride  (PROPECIA ) 1 MG tablet TAKE ONE TABLET BY MOUTH DAILY 11/13/22   Cox, Burleigh Carp, MD   hydrOXYzine  (VISTARIL ) 25 MG capsule Take 1 capsule (25 mg total) by mouth every 8 (eight) hours as needed. 10/11/22   Mercy Stall, MD  isosorbide  mononitrate (IMDUR ) 30 MG 24 hr tablet Take 1 tablet by mouth every day 08/16/23   Sirivol, Mamatha, MD  lisinopril -hydrochlorothiazide  (ZESTORETIC ) 20-12.5 MG tablet TAKE TWO (2) TABLETS BY MOUTH EVERY MORNING 07/31/23   Sirivol, Mamatha, MD  meclizine  (ANTIVERT ) 25 MG tablet TAKE 1 TABLET BY MOUTH THREE TIMES DAILY AS NEEDED FOR DIZZINESS *REFILL REQUEST* 01/27/23   Cox, Burleigh Carp, MD  morphine  (MSIR) 15 MG tablet Take 15 mg by mouth every 8 (eight) hours as needed. 08/02/23   [provider]  nitroGLYCERIN  (NITROSTAT ) 0.4 MG SL tablet Place 1 tablet (0.4 mg total) under the tongue every 5 (five) minutes as needed for chest pain. 04/16/23   Sirivol, Alla Ar, MD  omeprazole  (PRILOSEC) 40 MG capsule TAKE 1 CAPSULE BY MOUTH EVERY MORNING 01/30/23   Cox, Kirsten, MD  oxyCODONE -acetaminophen  (PERCOCET) 7.5-325 MG tablet Take 1 tablet by mouth every 6 (six) hours as needed. Patient not taking: Reported on 08/14/2023 12/29/22   [provider]  potassium chloride  (KLOR-CON  M) 10 MEQ tablet TAKE 1 TABLET BY MOUTH ONCE DAILY 07/31/23   Sirivol, Mamatha, MD  promethazine  (PHENERGAN ) 25 MG tablet Take 1 tablet by mouth every 6 hours as needed for nausea and vomiting 09/04/23   Cox, Burleigh Carp, MD  rosuvastatin  (CRESTOR ) 10 MG  tablet TAKE 1 TABLET BY MOUTH EVERY DAY AT BEDTIME 05/31/23   Sirivol, Alla Ar, MD  traZODone  (DESYREL ) 100 MG tablet TAKE 1 TABLET BY MOUTH DAILY AT BEDTIME 07/31/23   Sirivol, Alla Ar, MD  Vitamin D , Ergocalciferol , (DRISDOL ) 1.25 MG (50000 UNIT) CAPS capsule TAKE ONE CAPSULE BY MOUTH ONCE WEEKLY ON MONDAY 12/18/22   CoxBurleigh Carp, MD      Allergies    Meperidine, Meperidine hcl, Gabapentin , Influenza vaccines, and Other    Review of Systems   Review of Systems  Cardiovascular:  Positive for leg swelling.  All other systems reviewed and are  negative.   Physical Exam Updated Vital Signs BP (!) 146/64   Pulse (!) 54   Temp 98.4 F (36.9 C) (Oral)   Resp 17   SpO2 100%  Physical Exam Vitals and nursing note reviewed.  Constitutional:      General: She is not in acute distress.    Appearance: Normal appearance.  HENT:     Head: Normocephalic and atraumatic.     Mouth/Throat:     Mouth: Mucous membranes are moist.  Eyes:     Conjunctiva/sclera: Conjunctivae normal.     Pupils: Pupils are equal, round, and reactive to light.  Cardiovascular:     Rate and Rhythm: Normal rate and regular rhythm.     Pulses: Normal pulses.     Heart sounds: Normal heart sounds.  Pulmonary:     Effort: Pulmonary effort is normal.     Breath sounds: Normal breath sounds.  Abdominal:     Palpations: Abdomen is soft.     Tenderness: There is no abdominal tenderness.  Musculoskeletal:        General: Normal range of motion.     Cervical back: Normal range of motion.     Right lower leg: Edema present.     Left lower leg: Edema present.     Comments: Bilateral lower extremity edema with left > right. Tenderness to palpation of bilateral calves and dorsal and plantar feet bilaterally with palpable dorsalis pedis pulses. Dry, cracked skin on right plantar foot.  Skin:    General: Skin is warm and dry.     Findings: No rash.  Neurological:     General: No focal deficit present.     Mental Status: She is alert.     Motor: No weakness.  Psychiatric:        Mood and Affect: Mood normal.        Behavior: Behavior normal.    ED Results / Procedures / Treatments   Labs (all labs ordered are listed, but only abnormal results are displayed) Labs Reviewed  BRAIN NATRIURETIC PEPTIDE - Abnormal; Notable for the following components:      Result Value   B Natriuretic Peptide 141.5 (*)    All other components within normal limits  CBC WITH DIFFERENTIAL/PLATELET - Abnormal; Notable for the following components:   RBC 3.68 (*)    HCT 35.7  (*)    All other components within normal limits  BASIC METABOLIC PANEL WITH GFR - Abnormal; Notable for the following components:   Sodium 132 (*)    Potassium 3.2 (*)    Chloride 95 (*)    All other components within normal limits  CBG MONITORING, ED    EKG None  Radiology VAS US  LOWER EXTREMITY ARTERIAL DUPLEX Result Date: 09/19/2023 LOWER EXTREMITY ARTERIAL DUPLEX STUDY Patient Name:  [name] [name]  Date of Exam:   09/19/2023 Medical Rec #:  [id number]     Accession #:    [id number] Date of Birth: [birth year]     Patient Gender: Patient Age:   17 years Exam Location:  [facility] Procedure:      VAS US  LOWER EXTREMITY ARTERIAL DUPLEX Referring Phys: --------------------------------------------------------------------------------  Indications: Rest pain, and peripheral artery disease. Patient presented to ED              with rest pain of feet and swelling 09/18/23 and a venous duplex was              ordered and result was negative. Arterial ordered 09/19/23. High Risk Factors: Hypertension, hyperlipidemia, Diabetes, current smoker,                    coronary artery disease.  Current ABI: R:0.29/absent flow to toe L:0.84/ TBI 0.55 Limitations: Heavy calcification, patient's movement secondary to rest pain. Comparison Study: No prior study on file Performing Technologist: Carleene Chase RVS  Examination Guidelines: A complete evaluation includes B-mode imaging, spectral Doppler, color Doppler, and power Doppler as needed of all accessible portions of each vessel. Bilateral testing is considered an integral part of a complete examination. Limited examinations for reoccurring indications may be performed as noted.  +-----------+--------+-----+---------------+----------+------------------------+ RIGHT      PSV cm/sRatioStenosis       Waveform  Comments                 +-----------+--------+-----+---------------+----------+------------------------+ CFA Prox   53                           monophasic                         +-----------+--------+-----+---------------+----------+------------------------+ DFA        75                          monophasic                         +-----------+--------+-----+---------------+----------+------------------------+ SFA Prox   242          50-74% stenosismonophasicpossible collateral flow                                                  (reversed waveform/color                                                  flow noted)              +-----------+--------+-----+---------------+----------+------------------------+ SFA Mid    29                          monophasic                         +-----------+--------+-----+---------------+----------+------------------------+ SFA Distal 33                          monophasic                         +-----------+--------+-----+---------------+----------+------------------------+  POP Prox   45                          monophasic                         +-----------+--------+-----+---------------+----------+------------------------+ POP Mid    50                          monophasic                         +-----------+--------+-----+---------------+----------+------------------------+ POP Distal 33                          monophasic                         +-----------+--------+-----+---------------+----------+------------------------+ TP Trunk   26                          monophasic                         +-----------+--------+-----+---------------+----------+------------------------+ ATA Prox   27                          monophasic                         +-----------+--------+-----+---------------+----------+------------------------+ ATA Mid    18                          monophasic                         +-----------+--------+-----+---------------+----------+------------------------+ ATA Distal 15                           monophasic                         +-----------+--------+-----+---------------+----------+------------------------+ PTA Prox   31                          monophasic                         +-----------+--------+-----+---------------+----------+------------------------+ PTA Mid    23                          monophasic                         +-----------+--------+-----+---------------+----------+------------------------+ PTA Distal 28                          monophasic                         +-----------+--------+-----+---------------+----------+------------------------+ PERO Prox  16                          monophasic                         +-----------+--------+-----+---------------+----------+------------------------+  PERO Mid                occluded                                          +-----------+--------+-----+---------------+----------+------------------------+ PERO Distal             occluded                                          +-----------+--------+-----+---------------+----------+------------------------+  Summary: Right: Calcific plaque noted throughout the right lower extremity arterial system, especially heavy in the CFA and SFA. Minimal flow noted in the proximal CFA. 50-74% stenosis noted at the proximal SFA. Monophasic/dampened monophasic waveforms noted throughout. The peroneal artery appears to be occluded in the mid to distal portion.  See table(s) above for measurements and observations. Suggest Peripheral Vascular Consult.    Preliminary    VAS US  ABI WITH/WO TBI Result Date: 09/19/2023  LOWER EXTREMITY DOPPLER STUDY Patient Name:  [name] [name]  Date of Exam:   09/19/2023 Medical Rec #: [id number]     Accession #:    [id number] Date of Birth: May 31, 1945     Patient Gender: F Patient Age:   23 years Exam Location:  [facility] Procedure:      VAS US  ABI WITH/WO TBI Referring Phys:  --------------------------------------------------------------------------------  Indications: Claudication, rest pain, and peripheral artery disease. High Risk Factors: Hypertension, hyperlipidemia, Diabetes, current smoker,                    coronary artery disease.  Comparison Study: No prior study on file Performing Technologist: Carleene Chase RVS  Examination Guidelines: A complete evaluation includes at minimum, Doppler waveform signals and systolic blood pressure reading at the level of bilateral brachial, anterior tibial, and posterior tibial arteries, when vessel segments are accessible. Bilateral testing is considered an integral part of a complete examination. Photoelectric Plethysmograph (PPG) waveforms and toe systolic pressure readings are included as required and additional duplex testing as needed. Limited examinations for reoccurring indications may be performed as noted.  ABI Findings: +---------+------------------+-----+-----------+--------+ Right    Rt Pressure (mmHg)IndexWaveform   Comment  +---------+------------------+-----+-----------+--------+ Brachial 168                    multiphasic         +---------+------------------+-----+-----------+--------+ PTA      50                0.29 monophasic          +---------+------------------+-----+-----------+--------+ DP       36                0.21 monophasic          +---------+------------------+-----+-----------+--------+ Great Toe0                 0.00 Absent              +---------+------------------+-----+-----------+--------+ +---------+------------------+-----+-----------+-------+ Left     Lt Pressure (mmHg)IndexWaveform   Comment +---------+------------------+-----+-----------+-------+ Brachial 170                    multiphasic        +---------+------------------+-----+-----------+-------+ PTA      142  0.84 multiphasic         +---------+------------------+-----+-----------+-------+ DP       139               0.82 multiphasic        +---------+------------------+-----+-----------+-------+ Great Toe94                0.55 Abnormal           +---------+------------------+-----+-----------+-------+ +-------+-----------+-----------+------------+------------+ ABI/TBIToday's ABIToday's TBIPrevious ABIPrevious TBI +-------+-----------+-----------+------------+------------+ Right  0.29       absent                              +-------+-----------+-----------+------------+------------+ Left   0.84       0.55                                +-------+-----------+-----------+------------+------------+  Summary: Right: Resting right ankle-brachial index indicates critical limb ischemia. The right toe-brachial index is abnormal. Left: Resting left ankle-brachial index indicates mild left lower extremity arterial disease. The left toe-brachial index is abnormal. *See table(s) above for measurements and observations.  Suggest Peripheral Vascular Consult.    Preliminary    VAS US  LOWER EXTREMITY VENOUS (DVT) (7a-7p) Result Date: 09/18/2023  Lower Venous DVT Study Patient Name:  [name] [name]  Date of Exam:   09/18/2023 Medical Rec #: [id number]     Accession #:    [id number] Date of Birth: [birth year]     Patient Gender: F Patient Age:   30 years Exam Location:  [facility] Procedure:      VAS US  LOWER EXTREMITY VENOUS (DVT) Referring Phys: Celesta Coke --------------------------------------------------------------------------------  Indications: Swelling, Edema, and Pain. Other Indications: Patients states that her 'right' leg is the symptomatic one                    not the left. Comparison Study: No prior exam on file. Performing Technologist: Ria Chad  Examination Guidelines: A complete evaluation includes B-mode imaging, spectral Doppler, color Doppler, and power Doppler as needed of all accessible  portions of each vessel. Bilateral testing is considered an integral part of a complete examination. Limited examinations for reoccurring indications may be performed as noted. The reflux portion of the exam is performed with the patient in reverse Trendelenburg.  +---------+---------------+---------+-----------+----------+--------------+ RIGHT    CompressibilityPhasicitySpontaneityPropertiesThrombus Aging +---------+---------------+---------+-----------+----------+--------------+ CFV      Full           Yes      Yes                                 +---------+---------------+---------+-----------+----------+--------------+ SFJ      Full           Yes      Yes                                 +---------+---------------+---------+-----------+----------+--------------+ FV Prox  Full                                                        +---------+---------------+---------+-----------+----------+--------------+ FV Mid   Full                                                        +---------+---------------+---------+-----------+----------+--------------+  FV DistalFull                                                        +---------+---------------+---------+-----------+----------+--------------+ PFV      Full                                                        +---------+---------------+---------+-----------+----------+--------------+ POP      Full           Yes      Yes                                 +---------+---------------+---------+-----------+----------+--------------+ PTV      Full                                                        +---------+---------------+---------+-----------+----------+--------------+ PERO     Full                                                        +---------+---------------+---------+-----------+----------+--------------+   +----+---------------+---------+-----------+----------+--------------+  LEFTCompressibilityPhasicitySpontaneityPropertiesThrombus Aging +----+---------------+---------+-----------+----------+--------------+ CFV Full           Yes      Yes                                 +----+---------------+---------+-----------+----------+--------------+ SFJ Full           Yes      Yes                                 +----+---------------+---------+-----------+----------+--------------+    Summary: RIGHT: - There is no evidence of deep vein thrombosis in the lower extremity.  - No cystic structure found in the popliteal fossa.  LEFT: - No evidence of common femoral vein obstruction.   *See table(s) above for measurements and observations.    Preliminary    DG Foot Complete Right Result Date: 09/18/2023 CLINICAL DATA:  Foot pain EXAM: RIGHT FOOT COMPLETE - 3+ VIEW COMPARISON:  None Available. FINDINGS: The bones are diffusely osteopenic. There is no acute fracture or dislocation. Joint spaces are maintained soft tissues are within normal limits. There is a plantar calcaneal spur. IMPRESSION: 1. No acute fracture or dislocation. 2. Diffuse osteopenia. Electronically Signed   By: Tyron Gallon M.D.   On: 09/18/2023 17:42   DG Foot Complete Left Result Date: 09/18/2023 CLINICAL DATA:  Foot pain and swelling. EXAM: LEFT FOOT - COMPLETE 3+ VIEW COMPARISON:  None Available. FINDINGS: The bones are diffusely osteopenic. There is no acute fracture or dislocation. Plantar calcaneus spur is present. Joint spaces are maintained. There is soft tissue swelling over the  dorsum of the foot. IMPRESSION: 1. No acute fracture or dislocation. 2. Soft tissue swelling over the dorsum of the foot. Electronically Signed   By: Tyron Gallon M.D.   On: 09/18/2023 17:41    Procedures Procedures    Medications Ordered in ED Medications  oxyCODONE -acetaminophen  (PERCOCET/ROXICET) 5-325 MG per tablet 2 tablet (2 tablets Oral Given 09/18/23 1724)  potassium chloride  SA (KLOR-CON  M) CR tablet 40  mEq (40 mEq Oral Given 09/19/23 1226)  morphine  (PF) 4 MG/ML injection 4 mg (4 mg Intravenous Given 09/19/23 1227)    ED Course/ Medical Decision Making/ A&P                                 Medical Decision Making Risk Prescription drug management.   This patient presents to the ED for concern of lower extremity swelling and foot pain, this involves an extensive number of treatment options, and is a complaint that carries with it a high risk of complications and morbidity.   Differential diagnosis includes: acute CHF exacerbation, DVT, cellulitis, PAD, etc.   Comorbidities  See HPI above   Additional History  Additional history obtained from prior records   Lab Tests  I ordered and personally interpreted labs.  The pertinent results include:   BNP of 141.5 CBC is reassuring Potassium of 3.2 on BMP   Imaging Studies  I ordered imaging studies including left and right feet x-rays, bilateral lower extremity venous ultrasounds I independently visualized and interpreted imaging which showed:  Lower extremities negative for DVT Arterial ultrasound - resting right ABI indicates critical limb ischemia - 0.29. Right toe-brachial index is abnormal. Resting left ABI indicates mild left lower extremity arterial disease - 0.84. Left toe-brachial index is abnormal. No acute fractures or dislocations of the feet. Soft tissue swelling of the dorsum of the right foot. Arterial duplex shows plaque noted throughout the right lower extremity arterial system, especially heavy in the CFA and SFA.  Minimal flow noted in the proximal CFA.  Peroneal artery appears to be occluded in the mid to distal portion. I agree with the radiologist interpretation   Consults I consulted and spoke with Dr. Charlotte Cookey with vascular surgery: he will come down and see patient. Will most likely be okay with discharge home and outpatient follow up.  Patient to be started on Plavix . He will see her in office on  Tuesday.   Problem List / ED Course / Critical Interventions / Medication Management  Bilateral feet and leg pain and swelling for the past 3-4 weeks. Pain is worse with ambulation. Some tingling to the right toes. No injury or trauma. No fevers. I ordered medications including: Morphine  for pain Potassium for hypokalemia  Reevaluation of the patient after these medicines showed that the patient improved I have reviewed the patients home medicines and have made adjustments as needed   Social Determinants of Health  Physical activity   Test / Admission - Considered  Discussed findings with patient. All questions were answered. She is stable and safe for discharge home. Return precautions given.       Final Clinical Impression(s) / ED Diagnoses Final diagnoses:  Peripheral artery disease (HCC)  Pain in both lower extremities    Rx / DC Orders ED Discharge Orders          Ordered    clopidogrel  (PLAVIX ) 75 MG tablet  Daily        09/19/23 1257  Sonnie Dusky, PA-C 09/19/23 1328    Merdis Stalling, MD 09/21/23 1044

## 2023-09-19 NOTE — ED Notes (Signed)
 PT to vascular with transport

## 2023-09-19 NOTE — Telephone Encounter (Signed)
 Called patient to schedule AWV. She stated she is currently in the emergency department being seen and asked if I could call on another and schedule.

## 2023-09-19 NOTE — H&P (View-Only) (Signed)
 [facility] [place]  Patient name: [name] [name] MRN: [id number] DOB: 01-May-1946 Sex: female   REQUESTING PROVIDER:    ER   REASON FOR CONSULT:    Right foot pain  HISTORY OF PRESENT ILLNESS:   [name] [name] is a 78 y.o. female, who presented to the emergency department with a 4 to 5-week history of right foot pain, which is different from her baseline discomfort.  She states that this will occasionally cramp and bother her with short distance walking.  It does wake her up at night.  She also complains of numbness and swelling in her feet particularly swelling in her toes.  The patient does have chronic pain and is seen at the pain center.  She has a history of type 2 diabetes.  She has a history of MI.  She suffers from COPD secondary to chronic tobacco smoking.  She is on a statin for hypercholesterolemia.  She is medically managed for hypertension.  PAST MEDICAL HISTORY    Past Medical History:  Diagnosis Date   Anxiety    Aortic atherosclerosis (HCC)    Ascending aorta dilation (HCC)    a.) TTE 09/02/19 --> mild; measured 39mm.   Asthma    BMI 24.0-24.9, adult 02/14/2022   CAD (coronary artery disease)    a,) CTA 08/24/20 --> mild CAD (25-49%) in RCA, LAD, LCx; CAC/Agatston score 509.   Cataract    Chronic left shoulder pain 09/27/2015   COPD (chronic obstructive pulmonary disease) (HCC)    Coronary artery disease involving native coronary artery of native heart without angina pectoris 11/18/2020   Degenerative joint disease involving multiple joints 09/28/2017   Depression    Depression, major, single episode, severe (HCC) 03/30/2020   Diastolic dysfunction    a.) TTE 09/02/19 --> LVEF 60-65%; G2DD. b.) TTE 07/23/20 --> LVEF 70-75%; G1DD.   Enlarged aorta (HCC)    Essential hypertension 08/07/2019   GERD (gastroesophageal reflux disease)    GERD with esophagitis 08/07/2019   History of kidney stones    Hyperlipidemia     Hypertension    Long-term current use of opiate analgesic 06/25/2017   Lupus    Myocardial infarction (HCC)    Osteoarthritis    PAC (premature atrial contraction)    a.) Holter 12/04/19 --> occassional; 1.1% PAC burden.   Pain of right hip joint 11/22/2015   Paraesophageal hernia    PAT (paroxysmal atrial tachycardia) (HCC) 02/03/2020   Presbyesophagus    Primary osteoarthritis of left knee 02/04/2015   PSVT (paroxysmal supraventricular tachycardia) (HCC)    a.) Holter 12/04/19 --> 22 runs.   Renal calculus 12/21/2021   S/P repair of paraesophageal hernia 02/22/2021   S/P total knee arthroplasty 02/15/2015   Schatzki's ring    Sciatica 01/31/2022   Tobacco use 08/19/2019   Type 2 diabetes mellitus with other specified complication (HCC) 08/07/2019     FAMILY HISTORY   Family History  Problem Relation Age of Onset   Stroke Mother    Heart disease Mother    COPD Father    Cancer Father        Bone   Diabetes Father    Heart disease Father    Stroke Father    Lupus Sister    Seizures Son    COPD Son    Colon cancer Neg Hx    Esophageal cancer Neg Hx    Stomach cancer Neg Hx    Rectal cancer Neg Hx  SOCIAL HISTORY:   Social History   Socioeconomic History   Marital status: Widowed    Spouse name: Not on file   Number of children: 4   Years of education: Not on file   Highest education level: Not on file  Occupational History   Occupation: Retired  Tobacco Use   Smoking status: Every Day    Current packs/day: 1.00    Average packs/day: 1 pack/day for 48.0 years (48.0 ttl pk-yrs)    Types: Cigarettes   Smokeless tobacco: Never  Vaping Use   Vaping status: Never Used  Substance and Sexual Activity   Alcohol use: No   Drug use: No   Sexual activity: Not Currently  Other Topics Concern   Not on file  Social History Narrative   Not on file   Social Drivers of Health   Financial Resource Strain: Low Risk  (07/13/2023)   Overall Financial Resource Strain  (CARDIA)    Difficulty of Paying Living Expenses: Not very hard  Food Insecurity: No Food Insecurity (07/13/2023)   Hunger Vital Sign    Worried About Running Out of Food in the Last Year: Never true    Ran Out of Food in the Last Year: Never true  Transportation Needs: Unmet Transportation Needs (07/13/2023)   PRAPARE - Transportation    Lack of Transportation (Medical): Yes    Lack of Transportation (Non-Medical): Yes  Physical Activity: Inactive (08/01/2022)   Exercise Vital Sign    Days of Exercise per Week: 0 days    Minutes of Exercise per Session: 0 min  Stress: Stress Concern Present (08/01/2022)   [employer] of Occupational Health - Occupational Stress Questionnaire    Feeling of Stress : To some extent  Social Connections: Socially Isolated (05/11/2022)   Social Connection and Isolation Panel [NHANES]    Frequency of Communication with Friends and Family: More than three times a week    Frequency of Social Gatherings with Friends and Family: Twice a week    Attends Religious Services: Never    Database administrator or Organizations: No    Attends Banker Meetings: Never    Marital Status: Widowed  Intimate Partner Violence: Not At Risk (07/11/2023)   Humiliation, Afraid, Rape, and Kick questionnaire    Fear of Current or Ex-Partner: No    Emotionally Abused: No    Physically Abused: No    Sexually Abused: No    ALLERGIES:    Allergies  Allergen Reactions   Meperidine Anaphylaxis and Other (See Comments)    Blood pressure dropped, also   Meperidine Hcl Anaphylaxis and Other (See Comments)    B/P dropped, also   Gabapentin      Vomiting    Influenza Vaccines Other (See Comments)    Flu-like symptoms   Other Rash and Other (See Comments)    asparagus    CURRENT MEDICATIONS:    No current facility-administered medications for this encounter.   Current Outpatient Medications  Medication Sig Dispense Refill   clopidogrel  (PLAVIX ) 75 MG tablet  Take 1 tablet (75 mg total) by mouth daily. 30 tablet 0   albuterol  (PROVENTIL ) (2.5 MG/3ML) 0.083% nebulizer solution Inhale 1 vial via nebulizer every 6 hours as needed for wheezing or shortness of breath 90 mL 11   albuterol  (VENTOLIN  HFA) 108 (90 Base) MCG/ACT inhaler Inhale 2 puffs by mouth every 6 hours as needed for shortness of breath or wheezing 6.7 each 11   aspirin  EC 81 MG tablet Take  1 tablet (81 mg total) by mouth daily. Swallow whole. 90 tablet 3   budeson-glycopyrrolate -formoterol  (BREZTRI  AEROSPHERE) 160-9-4.8 MCG/ACT AERO INHALE TWO (2) PUFFS BY MOUTH INTO THE LUNGS TWICE DAILY AS NEEDED FOR SHORTNESS OF BREATH 10.7 g 10   buPROPion  (WELLBUTRIN  XL) 150 MG 24 hr tablet Take 1 tablet by mouth every morning 30 tablet 11   cloNIDine  (CATAPRES ) 0.1 MG tablet TAKE ONE (1) TABLET BY MOUTH TWICE DAILY 60 tablet 10   finasteride  (PROPECIA ) 1 MG tablet TAKE ONE TABLET BY MOUTH DAILY 30 tablet 3   hydrOXYzine  (VISTARIL ) 25 MG capsule Take 1 capsule (25 mg total) by mouth every 8 (eight) hours as needed. 30 capsule 0   isosorbide  mononitrate (IMDUR ) 30 MG 24 hr tablet Take 1 tablet by mouth every day 30 tablet 11   lisinopril -hydrochlorothiazide  (ZESTORETIC ) 20-12.5 MG tablet TAKE TWO (2) TABLETS BY MOUTH EVERY MORNING 60 tablet 10   meclizine  (ANTIVERT ) 25 MG tablet TAKE 1 TABLET BY MOUTH THREE TIMES DAILY AS NEEDED FOR DIZZINESS *REFILL REQUEST* 180 tablet 1   morphine  (MSIR) 15 MG tablet Take 15 mg by mouth every 8 (eight) hours as needed.     nitroGLYCERIN  (NITROSTAT ) 0.4 MG SL tablet Place 1 tablet (0.4 mg total) under the tongue every 5 (five) minutes as needed for chest pain. 30 tablet 1   omeprazole  (PRILOSEC) 40 MG capsule TAKE 1 CAPSULE BY MOUTH EVERY MORNING 180 capsule 1   oxyCODONE -acetaminophen  (PERCOCET) 7.5-325 MG tablet Take 1 tablet by mouth every 6 (six) hours as needed. (Patient not taking: Reported on 08/14/2023)     potassium chloride  (KLOR-CON  M) 10 MEQ tablet TAKE 1  TABLET BY MOUTH ONCE DAILY 30 tablet 10   promethazine  (PHENERGAN ) 25 MG tablet Take 1 tablet by mouth every 6 hours as needed for nausea and vomiting 120 tablet 2   rosuvastatin  (CRESTOR ) 10 MG tablet TAKE 1 TABLET BY MOUTH EVERY DAY AT BEDTIME 90 tablet 1   traZODone  (DESYREL ) 100 MG tablet TAKE 1 TABLET BY MOUTH DAILY AT BEDTIME 30 tablet 10   Vitamin D , Ergocalciferol , (DRISDOL ) 1.25 MG (50000 UNIT) CAPS capsule TAKE ONE CAPSULE BY MOUTH ONCE WEEKLY ON MONDAY 12 capsule 2    REVIEW OF SYSTEMS:   [X]  denotes positive finding, [ ]  denotes negative finding Cardiac  Comments:  Chest pain or chest pressure:    Shortness of breath upon exertion:    Short of breath when lying flat:    Irregular heart rhythm:        Vascular    Pain in calf, thigh, or hip brought on by ambulation: x   Pain in feet at night that wakes you up from your sleep:  x   Blood clot in your veins:    Leg swelling:         Pulmonary    Oxygen  at home:    Productive cough:     Wheezing:         Neurologic    Sudden weakness in arms or legs:     Sudden numbness in arms or legs:     Sudden onset of difficulty speaking or slurred speech:    Temporary loss of vision in one eye:     Problems with dizziness:         Gastrointestinal    Blood in stool:      Vomited blood:         Genitourinary    Burning when urinating:  Blood in urine:        Psychiatric    Major depression:         Hematologic    Bleeding problems:    Problems with blood clotting too easily:        Skin    Rashes or ulcers:        Constitutional    Fever or chills:     PHYSICAL EXAM:   Vitals:   09/19/23 0538 09/19/23 1225 09/19/23 1226 09/19/23 1300  BP: (!) 142/71  (!) 146/64 (!) 143/65  Pulse: (!) 51  (!) 54 (!) 54  Resp: 15  17 16   Temp: 97.9 F (36.6 C) 98.4 F (36.9 C)    TempSrc:  Oral    SpO2: 100%  100% 100%    GENERAL: The patient is a well-nourished female, in no acute distress. The vital signs are  documented above. CARDIAC: There is a regular rate and rhythm.  VASCULAR: palpable femoral pulses, although the left is stronger than the right.  Non-palpable pedal pulses PULMONARY: Nonlabored respirations ABDOMEN: Soft and non-tender with normal pitched bowel sounds.  MUSCULOSKELETAL: There are no major deformities or cyanosis. NEUROLOGIC: No focal weakness or paresthesias are detected. Normal motor and sensory function of the right foot SKIN: There are no ulcers or rashes noted. PSYCHIATRIC: The patient has a normal affect.  STUDIES:   I have reviewed the following: ABI/TBIToday's ABIToday's TBIPrevious ABIPrevious TBI  +-------+-----------+-----------+------------+------------+  Right 0.29       absent                               +-------+-----------+-----------+------------+------------+  Left  0.84       0.55                                 +----  Right: Calcific plaque noted throughout the right lower extremity arterial  system, especially heavy in the CFA and SFA. Minimal flow noted in the  proximal CFA. 50-74% stenosis noted at the proximal SFA.  Monophasic/dampened monophasic waveforms noted  throughout. The peroneal artery appears to be occluded in the mid to  distal portion.  ASSESSMENT and PLAN   PAD with ischemic rest pain to right foot: The patient has chronic pain however the pain in her right foot is relatively new, beginning about a month ago.  Her ABIs are markedly different comparing the right to the left.  I suspect that a large component of her pain in the right foot is secondary to arterial insufficiency causing rest pain.  She needs to be optimized from a medical standpoint.  I am starting her on Plavix  today.  She is already on a statin and aspirin .  I counseled her about the importance of smoking cessation.  At this point because of the severity of her disease, she needs to undergo angiography via a left femoral approach to evaluate blood flow  to her right leg and intervene if indicated.  This can be arranged as an outpatient.  I am placing her on the schedule for Tuesday.  My office will contact her to give her the details of how to proceed.   Marti Slates, MD, FACS [facility] [phone] Pager [phone]

## 2023-09-19 NOTE — Consult Note (Signed)
 [facility] [place]  Patient name: [name] [name] MRN: [id number] DOB: 01-May-1946 Sex: female   REQUESTING PROVIDER:    ER   REASON FOR CONSULT:    Right foot pain  HISTORY OF PRESENT ILLNESS:   [name] [name] is a 78 y.o. female, who presented to the emergency department with a 4 to 5-week history of right foot pain, which is different from her baseline discomfort.  She states that this will occasionally cramp and bother her with short distance walking.  It does wake her up at night.  She also complains of numbness and swelling in her feet particularly swelling in her toes.  The patient does have chronic pain and is seen at the pain center.  She has a history of type 2 diabetes.  She has a history of MI.  She suffers from COPD secondary to chronic tobacco smoking.  She is on a statin for hypercholesterolemia.  She is medically managed for hypertension.  PAST MEDICAL HISTORY    Past Medical History:  Diagnosis Date   Anxiety    Aortic atherosclerosis (HCC)    Ascending aorta dilation (HCC)    a.) TTE 09/02/19 --> mild; measured 39mm.   Asthma    BMI 24.0-24.9, adult 02/14/2022   CAD (coronary artery disease)    a,) CTA 08/24/20 --> mild CAD (25-49%) in RCA, LAD, LCx; CAC/Agatston score 509.   Cataract    Chronic left shoulder pain 09/27/2015   COPD (chronic obstructive pulmonary disease) (HCC)    Coronary artery disease involving native coronary artery of native heart without angina pectoris 11/18/2020   Degenerative joint disease involving multiple joints 09/28/2017   Depression    Depression, major, single episode, severe (HCC) 03/30/2020   Diastolic dysfunction    a.) TTE 09/02/19 --> LVEF 60-65%; G2DD. b.) TTE 07/23/20 --> LVEF 70-75%; G1DD.   Enlarged aorta (HCC)    Essential hypertension 08/07/2019   GERD (gastroesophageal reflux disease)    GERD with esophagitis 08/07/2019   History of kidney stones    Hyperlipidemia     Hypertension    Long-term current use of opiate analgesic 06/25/2017   Lupus    Myocardial infarction (HCC)    Osteoarthritis    PAC (premature atrial contraction)    a.) Holter 12/04/19 --> occassional; 1.1% PAC burden.   Pain of right hip joint 11/22/2015   Paraesophageal hernia    PAT (paroxysmal atrial tachycardia) (HCC) 02/03/2020   Presbyesophagus    Primary osteoarthritis of left knee 02/04/2015   PSVT (paroxysmal supraventricular tachycardia) (HCC)    a.) Holter 12/04/19 --> 22 runs.   Renal calculus 12/21/2021   S/P repair of paraesophageal hernia 02/22/2021   S/P total knee arthroplasty 02/15/2015   Schatzki's ring    Sciatica 01/31/2022   Tobacco use 08/19/2019   Type 2 diabetes mellitus with other specified complication (HCC) 08/07/2019     FAMILY HISTORY   Family History  Problem Relation Age of Onset   Stroke Mother    Heart disease Mother    COPD Father    Cancer Father        Bone   Diabetes Father    Heart disease Father    Stroke Father    Lupus Sister    Seizures Son    COPD Son    Colon cancer Neg Hx    Esophageal cancer Neg Hx    Stomach cancer Neg Hx    Rectal cancer Neg Hx  SOCIAL HISTORY:   Social History   Socioeconomic History   Marital status: Widowed    Spouse name: Not on file   Number of children: 4   Years of education: Not on file   Highest education level: Not on file  Occupational History   Occupation: Retired  Tobacco Use   Smoking status: Every Day    Current packs/day: 1.00    Average packs/day: 1 pack/day for 48.0 years (48.0 ttl pk-yrs)    Types: Cigarettes   Smokeless tobacco: Never  Vaping Use   Vaping status: Never Used  Substance and Sexual Activity   Alcohol use: No   Drug use: No   Sexual activity: Not Currently  Other Topics Concern   Not on file  Social History Narrative   Not on file   Social Drivers of Health   Financial Resource Strain: Low Risk  (07/13/2023)   Overall Financial Resource Strain  (CARDIA)    Difficulty of Paying Living Expenses: Not very hard  Food Insecurity: No Food Insecurity (07/13/2023)   Hunger Vital Sign    Worried About Running Out of Food in the Last Year: Never true    Ran Out of Food in the Last Year: Never true  Transportation Needs: Unmet Transportation Needs (07/13/2023)   PRAPARE - Transportation    Lack of Transportation (Medical): Yes    Lack of Transportation (Non-Medical): Yes  Physical Activity: Inactive (08/01/2022)   Exercise Vital Sign    Days of Exercise per Week: 0 days    Minutes of Exercise per Session: 0 min  Stress: Stress Concern Present (08/01/2022)   [employer] of Occupational Health - Occupational Stress Questionnaire    Feeling of Stress : To some extent  Social Connections: Socially Isolated (05/11/2022)   Social Connection and Isolation Panel [NHANES]    Frequency of Communication with Friends and Family: More than three times a week    Frequency of Social Gatherings with Friends and Family: Twice a week    Attends Religious Services: Never    Database administrator or Organizations: No    Attends Banker Meetings: Never    Marital Status: Widowed  Intimate Partner Violence: Not At Risk (07/11/2023)   Humiliation, Afraid, Rape, and Kick questionnaire    Fear of Current or Ex-Partner: No    Emotionally Abused: No    Physically Abused: No    Sexually Abused: No    ALLERGIES:    Allergies  Allergen Reactions   Meperidine Anaphylaxis and Other (See Comments)    Blood pressure dropped, also   Meperidine Hcl Anaphylaxis and Other (See Comments)    B/P dropped, also   Gabapentin      Vomiting    Influenza Vaccines Other (See Comments)    Flu-like symptoms   Other Rash and Other (See Comments)    asparagus    CURRENT MEDICATIONS:    No current facility-administered medications for this encounter.   Current Outpatient Medications  Medication Sig Dispense Refill   clopidogrel  (PLAVIX ) 75 MG tablet  Take 1 tablet (75 mg total) by mouth daily. 30 tablet 0   albuterol  (PROVENTIL ) (2.5 MG/3ML) 0.083% nebulizer solution Inhale 1 vial via nebulizer every 6 hours as needed for wheezing or shortness of breath 90 mL 11   albuterol  (VENTOLIN  HFA) 108 (90 Base) MCG/ACT inhaler Inhale 2 puffs by mouth every 6 hours as needed for shortness of breath or wheezing 6.7 each 11   aspirin  EC 81 MG tablet Take  1 tablet (81 mg total) by mouth daily. Swallow whole. 90 tablet 3   budeson-glycopyrrolate -formoterol  (BREZTRI  AEROSPHERE) 160-9-4.8 MCG/ACT AERO INHALE TWO (2) PUFFS BY MOUTH INTO THE LUNGS TWICE DAILY AS NEEDED FOR SHORTNESS OF BREATH 10.7 g 10   buPROPion  (WELLBUTRIN  XL) 150 MG 24 hr tablet Take 1 tablet by mouth every morning 30 tablet 11   cloNIDine  (CATAPRES ) 0.1 MG tablet TAKE ONE (1) TABLET BY MOUTH TWICE DAILY 60 tablet 10   finasteride  (PROPECIA ) 1 MG tablet TAKE ONE TABLET BY MOUTH DAILY 30 tablet 3   hydrOXYzine  (VISTARIL ) 25 MG capsule Take 1 capsule (25 mg total) by mouth every 8 (eight) hours as needed. 30 capsule 0   isosorbide  mononitrate (IMDUR ) 30 MG 24 hr tablet Take 1 tablet by mouth every day 30 tablet 11   lisinopril -hydrochlorothiazide  (ZESTORETIC ) 20-12.5 MG tablet TAKE TWO (2) TABLETS BY MOUTH EVERY MORNING 60 tablet 10   meclizine  (ANTIVERT ) 25 MG tablet TAKE 1 TABLET BY MOUTH THREE TIMES DAILY AS NEEDED FOR DIZZINESS *REFILL REQUEST* 180 tablet 1   morphine  (MSIR) 15 MG tablet Take 15 mg by mouth every 8 (eight) hours as needed.     nitroGLYCERIN  (NITROSTAT ) 0.4 MG SL tablet Place 1 tablet (0.4 mg total) under the tongue every 5 (five) minutes as needed for chest pain. 30 tablet 1   omeprazole  (PRILOSEC) 40 MG capsule TAKE 1 CAPSULE BY MOUTH EVERY MORNING 180 capsule 1   oxyCODONE -acetaminophen  (PERCOCET) 7.5-325 MG tablet Take 1 tablet by mouth every 6 (six) hours as needed. (Patient not taking: Reported on 08/14/2023)     potassium chloride  (KLOR-CON  M) 10 MEQ tablet TAKE 1  TABLET BY MOUTH ONCE DAILY 30 tablet 10   promethazine  (PHENERGAN ) 25 MG tablet Take 1 tablet by mouth every 6 hours as needed for nausea and vomiting 120 tablet 2   rosuvastatin  (CRESTOR ) 10 MG tablet TAKE 1 TABLET BY MOUTH EVERY DAY AT BEDTIME 90 tablet 1   traZODone  (DESYREL ) 100 MG tablet TAKE 1 TABLET BY MOUTH DAILY AT BEDTIME 30 tablet 10   Vitamin D , Ergocalciferol , (DRISDOL ) 1.25 MG (50000 UNIT) CAPS capsule TAKE ONE CAPSULE BY MOUTH ONCE WEEKLY ON MONDAY 12 capsule 2    REVIEW OF SYSTEMS:   [X]  denotes positive finding, [ ]  denotes negative finding Cardiac  Comments:  Chest pain or chest pressure:    Shortness of breath upon exertion:    Short of breath when lying flat:    Irregular heart rhythm:        Vascular    Pain in calf, thigh, or hip brought on by ambulation: x   Pain in feet at night that wakes you up from your sleep:  x   Blood clot in your veins:    Leg swelling:         Pulmonary    Oxygen  at home:    Productive cough:     Wheezing:         Neurologic    Sudden weakness in arms or legs:     Sudden numbness in arms or legs:     Sudden onset of difficulty speaking or slurred speech:    Temporary loss of vision in one eye:     Problems with dizziness:         Gastrointestinal    Blood in stool:      Vomited blood:         Genitourinary    Burning when urinating:  Blood in urine:        Psychiatric    Major depression:         Hematologic    Bleeding problems:    Problems with blood clotting too easily:        Skin    Rashes or ulcers:        Constitutional    Fever or chills:     PHYSICAL EXAM:   Vitals:   09/19/23 0538 09/19/23 1225 09/19/23 1226 09/19/23 1300  BP: (!) 142/71  (!) 146/64 (!) 143/65  Pulse: (!) 51  (!) 54 (!) 54  Resp: 15  17 16   Temp: 97.9 F (36.6 C) 98.4 F (36.9 C)    TempSrc:  Oral    SpO2: 100%  100% 100%    GENERAL: The patient is a well-nourished female, in no acute distress. The vital signs are  documented above. CARDIAC: There is a regular rate and rhythm.  VASCULAR: palpable femoral pulses, although the left is stronger than the right.  Non-palpable pedal pulses PULMONARY: Nonlabored respirations ABDOMEN: Soft and non-tender with normal pitched bowel sounds.  MUSCULOSKELETAL: There are no major deformities or cyanosis. NEUROLOGIC: No focal weakness or paresthesias are detected. Normal motor and sensory function of the right foot SKIN: There are no ulcers or rashes noted. PSYCHIATRIC: The patient has a normal affect.  STUDIES:   I have reviewed the following: ABI/TBIToday's ABIToday's TBIPrevious ABIPrevious TBI  +-------+-----------+-----------+------------+------------+  Right 0.29       absent                               +-------+-----------+-----------+------------+------------+  Left  0.84       0.55                                 +----  Right: Calcific plaque noted throughout the right lower extremity arterial  system, especially heavy in the CFA and SFA. Minimal flow noted in the  proximal CFA. 50-74% stenosis noted at the proximal SFA.  Monophasic/dampened monophasic waveforms noted  throughout. The peroneal artery appears to be occluded in the mid to  distal portion.  ASSESSMENT and PLAN   PAD with ischemic rest pain to right foot: The patient has chronic pain however the pain in her right foot is relatively new, beginning about a month ago.  Her ABIs are markedly different comparing the right to the left.  I suspect that a large component of her pain in the right foot is secondary to arterial insufficiency causing rest pain.  She needs to be optimized from a medical standpoint.  I am starting her on Plavix  today.  She is already on a statin and aspirin .  I counseled her about the importance of smoking cessation.  At this point because of the severity of her disease, she needs to undergo angiography via a left femoral approach to evaluate blood flow  to her right leg and intervene if indicated.  This can be arranged as an outpatient.  I am placing her on the schedule for Tuesday.  My office will contact her to give her the details of how to proceed.   Marti Slates, MD, FACS [facility] [phone] Pager [phone]

## 2023-09-19 NOTE — Discharge Instructions (Addendum)
 You have impaired blood flow in the arterials in your legs - the right leg is worse than the right. For this you will take Plavix  daily. I sent a prescription to your pharmacy.   You will need to follow up with vascular surgery on Tuesday for further imaging. I have provided Dr. Mick Alamin office information.

## 2023-09-20 ENCOUNTER — Telehealth: Payer: Self-pay

## 2023-09-20 DIAGNOSIS — R600 Localized edema: Secondary | ICD-10-CM

## 2023-09-20 HISTORY — DX: Localized edema: R60.0

## 2023-09-20 NOTE — Patient Instructions (Signed)
 VISIT SUMMARY:  You came in today with concerns about severe leg pain and swelling, which has been affecting both of your legs and limiting your ability to walk and stand. You also mentioned chronic back and hip pain that has not been well-managed with your current medication.  YOUR PLAN:  -LEG PAIN AND SWELLING: Your leg pain and swelling, along with numbness and coldness, may be due to poor blood flow or circulatory issues. We will perform an ABI test to check your blood flow. If the results are abnormal, we will refer you to a vascular surgeon for further evaluation, which may include placing a stent to improve blood flow. We may also do an echocardiogram to check your heart function and rule out heart failure. If you notice any signs of severely decreased blood flow, such as dusky or white toes, please seek emergency care immediately.  -CHRONIC BACK AND HIP PAIN: Your chronic back and hip pain has not been well-managed with your current medication. We will continue with your current pain management regimen for now, but we can discuss other pain management options if it remains ineffective.  INSTRUCTIONS:  We need to schedule an ABI test, an echocardiogram, and a follow-up with cardiology. We will try to coordinate these appointments in a single visit to help with your transportation challenges.

## 2023-09-20 NOTE — Telephone Encounter (Signed)
 Attempted to call for surgery scheduling. VM is full.    Need to inform patient to arrive to [facility] hospital on 4/29 at 11:00 am.  Needs to be NPO.

## 2023-09-20 NOTE — Assessment & Plan Note (Signed)
 Severe left leg pain and swelling with numbness and coldness suggests possible PAD or circulatory issues. Concern for ischemic pain due to decreased blood flow. - Order ABI test to assess blood flow. - Refer to vascular surgery if ABI is abnormal for further evaluation, including potential stent placement. - Consider echocardiogram to evaluate cardiac function and rule out congestive heart failure. - Advised emergency care for signs of decreased blood flow, such as dusky or white toes.

## 2023-09-20 NOTE — Assessment & Plan Note (Signed)
 Chronic pain inadequately managed by acetaminophen /hydrocodone  10/325 mg. Declined further injections or spinal blocks. - Continue current pain management regimen. - Discuss additional pain management options if current regimen remains ineffective.

## 2023-09-21 ENCOUNTER — Ambulatory Visit (HOSPITAL_BASED_OUTPATIENT_CLINIC_OR_DEPARTMENT_OTHER): Admitting: Radiology

## 2023-09-25 ENCOUNTER — Telehealth: Payer: Self-pay

## 2023-09-25 ENCOUNTER — Other Ambulatory Visit: Payer: Self-pay

## 2023-09-25 ENCOUNTER — Ambulatory Visit (HOSPITAL_COMMUNITY)
Admission: RE | Admit: 2023-09-25 | Discharge: 2023-09-25 | Disposition: A | Discharge status: Home / Self Care | Attending: Surgery | Admitting: Surgery

## 2023-09-25 ENCOUNTER — Encounter (HOSPITAL_COMMUNITY)
Admission: RE | Disposition: A | Discharge status: Home / Self Care | Payer: Self-pay | Source: Home / Self Care | Attending: Surgery

## 2023-09-25 DIAGNOSIS — I70221 Atherosclerosis of native arteries of extremities with rest pain, right leg: Secondary | ICD-10-CM

## 2023-09-25 DIAGNOSIS — I951 Orthostatic hypotension: Secondary | ICD-10-CM | POA: Diagnosis not present

## 2023-09-25 DIAGNOSIS — I70223 Atherosclerosis of native arteries of extremities with rest pain, bilateral legs: Secondary | ICD-10-CM

## 2023-09-25 DIAGNOSIS — G8929 Other chronic pain: Secondary | ICD-10-CM | POA: Insufficient documentation

## 2023-09-25 DIAGNOSIS — Z823 Family history of stroke: Secondary | ICD-10-CM | POA: Diagnosis not present

## 2023-09-25 DIAGNOSIS — Z96659 Presence of unspecified artificial knee joint: Secondary | ICD-10-CM | POA: Diagnosis not present

## 2023-09-25 DIAGNOSIS — K219 Gastro-esophageal reflux disease without esophagitis: Secondary | ICD-10-CM | POA: Diagnosis not present

## 2023-09-25 DIAGNOSIS — Z7982 Long term (current) use of aspirin: Secondary | ICD-10-CM | POA: Insufficient documentation

## 2023-09-25 DIAGNOSIS — F17210 Nicotine dependence, cigarettes, uncomplicated: Secondary | ICD-10-CM | POA: Diagnosis not present

## 2023-09-25 DIAGNOSIS — I252 Old myocardial infarction: Secondary | ICD-10-CM | POA: Diagnosis not present

## 2023-09-25 DIAGNOSIS — I10 Essential (primary) hypertension: Secondary | ICD-10-CM | POA: Insufficient documentation

## 2023-09-25 DIAGNOSIS — E119 Type 2 diabetes mellitus without complications: Secondary | ICD-10-CM | POA: Insufficient documentation

## 2023-09-25 DIAGNOSIS — I700 Atherosclerosis of aorta: Secondary | ICD-10-CM | POA: Diagnosis not present

## 2023-09-25 DIAGNOSIS — E7800 Pure hypercholesterolemia, unspecified: Secondary | ICD-10-CM | POA: Diagnosis not present

## 2023-09-25 DIAGNOSIS — J4489 Other specified chronic obstructive pulmonary disease: Secondary | ICD-10-CM | POA: Insufficient documentation

## 2023-09-25 DIAGNOSIS — J449 Chronic obstructive pulmonary disease, unspecified: Secondary | ICD-10-CM | POA: Diagnosis not present

## 2023-09-25 DIAGNOSIS — M329 Systemic lupus erythematosus, unspecified: Secondary | ICD-10-CM | POA: Diagnosis not present

## 2023-09-25 DIAGNOSIS — I77810 Thoracic aortic ectasia: Secondary | ICD-10-CM | POA: Diagnosis not present

## 2023-09-25 DIAGNOSIS — E1151 Type 2 diabetes mellitus with diabetic peripheral angiopathy without gangrene: Secondary | ICD-10-CM | POA: Insufficient documentation

## 2023-09-25 DIAGNOSIS — Z7902 Long term (current) use of antithrombotics/antiplatelets: Secondary | ICD-10-CM | POA: Insufficient documentation

## 2023-09-25 DIAGNOSIS — R600 Localized edema: Secondary | ICD-10-CM

## 2023-09-25 DIAGNOSIS — Z79899 Other long term (current) drug therapy: Secondary | ICD-10-CM | POA: Insufficient documentation

## 2023-09-25 DIAGNOSIS — Z8249 Family history of ischemic heart disease and other diseases of the circulatory system: Secondary | ICD-10-CM | POA: Diagnosis not present

## 2023-09-25 DIAGNOSIS — I2510 Atherosclerotic heart disease of native coronary artery without angina pectoris: Secondary | ICD-10-CM | POA: Diagnosis not present

## 2023-09-25 DIAGNOSIS — I743 Embolism and thrombosis of arteries of the lower extremities: Secondary | ICD-10-CM | POA: Diagnosis not present

## 2023-09-25 HISTORY — PX: ABDOMINAL AORTOGRAM W/LOWER EXTREMITY: CATH118223

## 2023-09-25 LAB — POCT I-STAT, CHEM 8
BUN: 8 mg/dL (ref 8–23)
Calcium, Ion: 1.17 mmol/L (ref 1.15–1.40)
Chloride: 93 mmol/L — ABNORMAL LOW (ref 98–111)
Creatinine, Ser: 0.7 mg/dL (ref 0.44–1.00)
Glucose, Bld: 85 mg/dL (ref 70–99)
HCT: 37 % (ref 36.0–46.0)
Hemoglobin: 12.6 g/dL (ref 12.0–15.0)
Potassium: 4.1 mmol/L (ref 3.5–5.1)
Sodium: 132 mmol/L — ABNORMAL LOW (ref 135–145)
TCO2: 28 mmol/L (ref 22–32)

## 2023-09-25 MED ORDER — LIDOCAINE HCL (PF) 1 % IJ SOLN
INTRAMUSCULAR | Status: AC
  Filled 2023-09-25: qty 30

## 2023-09-25 MED ORDER — ONDANSETRON HCL 4 MG/2ML IJ SOLN: 4.0000 mg | Freq: Four times a day (QID) | INTRAMUSCULAR | Status: DC | PRN

## 2023-09-25 MED ORDER — LIDOCAINE HCL (PF) 1 % IJ SOLN
INTRAMUSCULAR | Status: DC | PRN
  Administered 2023-09-25: 12 mL

## 2023-09-25 MED ORDER — SODIUM CHLORIDE 0.9% FLUSH: 3.0000 mL | Freq: Two times a day (BID) | INTRAVENOUS | Status: DC

## 2023-09-25 MED ORDER — SODIUM CHLORIDE 0.9% FLUSH: 3.0000 mL | INTRAVENOUS | Status: DC | PRN

## 2023-09-25 MED ORDER — SODIUM CHLORIDE 0.9 % IV SOLN: 250.0000 mL | INTRAVENOUS | Status: DC | PRN

## 2023-09-25 MED ORDER — MIDAZOLAM HCL 2 MG/2ML IJ SOLN
INTRAMUSCULAR | Status: AC
  Filled 2023-09-25: qty 2

## 2023-09-25 MED ORDER — FENTANYL CITRATE (PF) 100 MCG/2ML IJ SOLN
INTRAMUSCULAR | Status: AC
  Filled 2023-09-25: qty 2

## 2023-09-25 MED ORDER — ACETAMINOPHEN 325 MG PO TABS: 650.0000 mg | ORAL_TABLET | ORAL | Status: DC | PRN

## 2023-09-25 MED ORDER — FENTANYL CITRATE (PF) 100 MCG/2ML IJ SOLN
INTRAMUSCULAR | Status: DC | PRN
  Administered 2023-09-25: 50 ug via INTRAVENOUS

## 2023-09-25 MED ORDER — HYDRALAZINE HCL 20 MG/ML IJ SOLN: 5.0000 mg | INTRAMUSCULAR | Status: DC | PRN

## 2023-09-25 MED ORDER — LABETALOL HCL 5 MG/ML IV SOLN: 10.0000 mg | INTRAVENOUS | Status: DC | PRN

## 2023-09-25 MED ORDER — SODIUM CHLORIDE 0.9 % WEIGHT BASED INFUSION
1.0000 mL/kg/h | INTRAVENOUS | Status: DC
  Administered 2023-09-25: 1 mL/kg/h via INTRAVENOUS

## 2023-09-25 MED ORDER — IODIXANOL 320 MG/ML IV SOLN
INTRAVENOUS | Status: DC | PRN
  Administered 2023-09-25: 88 mL

## 2023-09-25 MED ORDER — HEPARIN (PORCINE) IN NACL 1000-0.9 UT/500ML-% IV SOLN
INTRAVENOUS | Status: DC | PRN
  Administered 2023-09-25: 500 mL

## 2023-09-25 MED ORDER — ASPIRIN 81 MG PO TBEC: 81.0000 mg | DELAYED_RELEASE_TABLET | Freq: Every day | ORAL | Status: DC

## 2023-09-25 MED ORDER — MIDAZOLAM HCL 2 MG/2ML IJ SOLN
INTRAMUSCULAR | Status: DC | PRN
  Administered 2023-09-25: 2 mg via INTRAVENOUS

## 2023-09-25 MED ORDER — SODIUM CHLORIDE 0.9 % IV SOLN: INTRAVENOUS | Status: DC

## 2023-09-25 NOTE — Discharge Instructions (Addendum)
 Do not resume plavix  Surgery scheduled for Friday am.  Office will call with details

## 2023-09-25 NOTE — Op Note (Signed)
    Patient name: [name] [name] MRN: [id number] DOB: 06/14/1945 Sex: female  09/25/2023 Pre-operative Diagnosis: Right leg rest pain Post-operative diagnosis:  Same Surgeon:  [name] Procedure Performed:  1.  Ultrasound-guided access, left femoral artery  2.  Abdominal aortogram  3.  Bilateral lower extremity angiogram  4.  Conscious sedation, 24 minutes   Indications: This is a 78 year old female with chronic pain issues who presented to the emergency department last week with a 1 month history of right foot ischemic pain.  She comes in today for angiogram for further evaluation and intervention if indicated.  Procedure:  The patient was identified in the holding area and taken to room 8.  The patient was then placed supine on the table and prepped and draped in the usual sterile fashion.  A time out was called.  Conscious sedation was administered with the use of IV fentanyl  and Versed  under continuous physician and nurse monitoring.  Heart rate, blood pressure, and oxygen  saturation were continuously monitored.  Total sedation time was 24 minutes.  Ultrasound was used to evaluate the left common femoral artery.  It was patent .  A digital ultrasound image was acquired.  A micropuncture needle was used to access the left common femoral artery under ultrasound guidance.  An 018 wire was advanced without resistance and a micropuncture sheath was placed.  The 018 wire was removed and a benson wire was placed.  The micropuncture sheath was exchanged for a 5 french sheath.  An omniflush catheter was advanced over the wire to the level of L-1.  An abdominal angiogram was obtained.  Next,  the omniflush catheter was pulled down to the aortic bifurcation and bilateral runoff was obtained.  Findings:   Aortogram: No significant renal artery stenosis was visualized.  The infrarenal abdominal aorta is widely patent without significant stenosis but heavily calcified.  Bilateral common and external iliac  arteries are heavily calcified but patent without significant stenosis  Right Lower Extremity: The right common femoral artery is occluded.  There is reconstitution of the proximal profundofemoral artery and the superficial femoral artery approximately 5 cm distal to its origin.  The superficial femoral artery is very small in caliber.  There is diffuse disease below the knee.  All 3 tibial vessels do appear to make it to the ankle and there is diffuse disease out onto the foot  Left Lower Extremity: The left common femoral, profundofemoral, and superficial femoral artery are heavily calcified but patent without significant stenosis.  The popliteal artery is patent throughout its course.  There is three-vessel runoff to the ankle through a small caliber tibial vessels and diffuse disease out of the foot  Intervention: Patient be taken to holding area for sheath pull  Impression:  #1  Right common femoral and proximal superficial femoral artery occlusion.  #2  Patient will be scheduled for right femoral endarterectomy with patch angioplasty  V. [name], M.D., [facility] Office: [phone] Pager:  [phone]

## 2023-09-25 NOTE — Interval H&P Note (Signed)
 History and Physical Interval Note:  09/25/2023 12:28 PM  [name] [name]  has presented today for surgery, with the diagnosis of ischemia bilateral.  The various methods of treatment have been discussed with the patient and family. After consideration of risks, benefits and other options for treatment, the patient has consented to  Procedure(s): ABDOMINAL AORTOGRAM W/LOWER EXTREMITY (N/A) Lower Extremity Angiography (N/A) LOWER EXTREMITY INTERVENTION (N/A) as a surgical intervention.  The patient's history has been reviewed, patient examined, no change in status, stable for surgery.  I have reviewed the patient's chart and labs.  Questions were answered to the patient's satisfaction.     [name]

## 2023-09-25 NOTE — Telephone Encounter (Signed)
 Spoke to patient's daughter in re: to surgery date and time for 5/2.  Arrival time is 5:30 am.  Patient's daughter acknowledged.

## 2023-09-25 NOTE — Progress Notes (Signed)
 SITE AREA: left groin/femoral  SITE PRIOR TO REMOVAL:  LEVEL 0  PRESSURE APPLIED FOR: approximately 25 minutes  MANUAL: yes, sheath removed by Alisa App, RN with assistance by Lynnetta Sauce, RN  PATIENT STATUS DURING PULL: stable  POST PULL SITE:  LEVEL 0  POST PULL INSTRUCTIONS GIVEN: yes, drsg x 24 hours, may shower tomorrow, no sitting in water x 1 week, take it easy climbing into and out of vehicles and on stairs, if you notice anything warm and moist to groin area, please notify staff, if bleeding occurs at home, please call [phone]  POST PULL PULSES PRESENT: bilateral pedal and post tibial pulses dopplerable  DRESSING APPLIED: gauze with tegaderm  BEDREST BEGINS @ 1437  COMMENTS:

## 2023-09-25 NOTE — Progress Notes (Signed)
 Patient and daughter was given discharge instructions. Both verbalized understanding.

## 2023-09-26 ENCOUNTER — Encounter (HOSPITAL_COMMUNITY): Payer: Self-pay | Admitting: Surgery

## 2023-09-26 ENCOUNTER — Other Ambulatory Visit: Payer: Self-pay

## 2023-09-26 NOTE — Progress Notes (Signed)
 PCP - Sirivol, Mamatha, MD  Cardiologist - Tobb, Kardie, DO   PPM/ICD - denies Device Orders - n/a Rep Notified - n/a  Chest x-ray - Chest CT 09-12-22 EKG - DOS Stress Test -  ECHO - 07-23-20 Cardiac Cath -  PFT- 12-31-20  CPAP - denies    Fasting Blood Sugar - per patient pre diabetes Checks Blood Sugar Dose not check blood sugar  Last A1c 5.3 on 08-14-23  Blood Thinner Instructions: continue per instructions given by surgeon Aspirin  Instructions: continue per instructions given by surgeon  ERAS Protcol - NPO  COVID TEST- n/a  Anesthesia review: yes, Hx of MI, AAA, COPD, HTN  Patient verbally denies any shortness of breath, fever, cough and chest pain during phone call   -------------  SDW INSTRUCTIONS given:  Your procedure is scheduled on Sep 28, 2023.  Report to [facility] Main Entrance "A" at 5:30 A.M., and check in at the Admitting office.  Call this number if you have problems the morning of surgery:  [phone]   Remember:  Do not eat or drink after midnight the night before your surgery     Take these medicines the morning of surgery with A SIP OF WATER  albuterol  (PROVENTIL )nebulizer solution  albuterol  (VENTOLIN  HFA)  inhaler  aspirin   (BREZTRI  AEROSPHERE)  buPROPion  (WELLBUTRIN  XL)  cloNIDine  (CATAPRES )  isosorbide  mononitrate (IMDUR )  meclizine  (ANTIVERT )  nitroGLYCERIN  (NITROSTAT )  omeprazole  (PRILOSEC)  oxyCODONE -acetaminophen  (PERCOCET)  promethazine  (PHENERGAN )   As of today, STOP taking any Aspirin  (unless otherwise instructed by your surgeon) Aleve, Naproxen, Ibuprofen , Motrin , Advil , Goody's, BC's, all herbal medications, fish oil, and all vitamins.                      Do not wear jewelry, make up, or nail polish            Do not wear lotions, powders, perfumes/colognes, or deodorant.            Do not shave 48 hours prior to surgery.  Men may shave face and neck.            Do not bring valuables to the hospital.            [facility] is not responsible for any belongings or valuables.  Do NOT Smoke (Tobacco/Vaping) 24 hours prior to your procedure If you use a CPAP at night, you may bring all equipment for your overnight stay.   Contacts, glasses, dentures or bridgework may not be worn into surgery.      For patients admitted to the hospital, discharge time will be determined by your treatment team.   Patients discharged the day of surgery will not be allowed to drive home, and someone needs to stay with them for 24 hours.    Special instructions:   [facility]- Preparing For Surgery  Before surgery, you can play an important role. Because skin is not sterile, your skin needs to be as free of germs as possible. You can reduce the number of germs on your skin by washing with CHG (chlorahexidine gluconate) Soap before surgery.  CHG is an antiseptic cleaner which kills germs and bonds with the skin to continue killing germs even after washing.    Oral Hygiene is also important to reduce your risk of infection.  Remember - BRUSH YOUR TEETH THE MORNING OF SURGERY WITH YOUR REGULAR TOOTHPASTE  Please do not use if you have an allergy to CHG or antibacterial soaps.  If your skin becomes reddened/irritated stop using the CHG.  Do not shave (including legs and underarms) for at least 48 hours prior to first CHG shower. It is OK to shave your face.  Please follow these instructions carefully.   Shower the NIGHT BEFORE SURGERY and the MORNING OF SURGERY with DIAL Soap.   Pat yourself dry with a CLEAN TOWEL.  Wear CLEAN PAJAMAS to bed the night before surgery  Place CLEAN SHEETS on your bed the night of your first shower and DO NOT SLEEP WITH PETS.   Day of Surgery: Please shower morning of surgery  Wear Clean/Comfortable clothing the morning of surgery Do not apply any deodorants/lotions.   Remember to brush your teeth WITH YOUR REGULAR TOOTHPASTE.   Questions were answered. Patient verbalized understanding of  instructions.

## 2023-09-27 NOTE — Anesthesia Preprocedure Evaluation (Addendum)
 Anesthesia Evaluation  Patient identified by MRN, date of birth, ID band Patient awake    Reviewed: Allergy & Precautions, NPO status , Patient's Chart, lab work & pertinent test results  History of Anesthesia Complications Negative for: history of anesthetic complications  Airway Mallampati: I  TM Distance: >3 FB Neck ROM: Full    Dental  (+) Dental Advisory Given, Edentulous Upper, Edentulous Lower   Pulmonary asthma , COPD, Current Smoker and Patient abstained from smoking.    + decreased breath sounds+ wheezing      Cardiovascular hypertension, (-) angina + CAD  (-) Past MI  Rhythm:Regular  1. Left ventricular ejection fraction, by estimation, is 70 to 75%. The  left ventricle has hyperdynamic function. The left ventricle has no  regional wall motion abnormalities. There is mild left ventricular  hypertrophy. Left ventricular diastolic  parameters are consistent with Grade I diastolic dysfunction (impaired  relaxation).   2. Right ventricular systolic function is normal. The right ventricular  size is normal.   3. The mitral valve is normal in structure. No evidence of mitral valve  regurgitation. No evidence of mitral stenosis.   4. The aortic valve is normal in structure. There is moderate  calcification of the aortic valve. There is mild thickening of the aortic  valve. Aortic valve regurgitation is not visualized. Mild to moderate  aortic valve sclerosis/calcification is  present, without any evidence of aortic stenosis.   5. The inferior vena cava is normal in size with greater than 50%  respiratory variability, suggesting right atrial pressure of 3 mmHg.     Neuro/Psych  Headaches PSYCHIATRIC DISORDERS Anxiety Depression     Neuromuscular disease    GI/Hepatic Neg liver ROS, hiatal hernia,GERD  Medicated and Controlled,,  Endo/Other  diabetes  Lab Results      Component                Value               Date                       HGBA1C                   5.3                 08/14/2023             Renal/GU negative Renal ROSLab Results      Component                Value               Date                      NA                       132 (L)             09/25/2023                K                        4.1                 09/25/2023                CO2  27                  09/19/2023                GLUCOSE                  85                  09/25/2023                BUN                      8                   09/25/2023                CREATININE               0.70                09/25/2023                CALCIUM                   9.2                 09/19/2023                EGFR                     94                  08/14/2023                GFRNONAA                 >60                 09/19/2023                Musculoskeletal  (+) Arthritis ,    Abdominal   Peds  Hematology Lab Results      Component                Value               Date                      WBC                      6.2                 09/18/2023                HGB                      12.6                09/25/2023                HCT                      37.0                09/25/2023                MCV  97.0                09/18/2023                PLT                      184                 09/18/2023              Anesthesia Other Findings   Reproductive/Obstetrics                              Anesthesia Physical Anesthesia Plan  ASA: 3  Anesthesia Plan: General   Post-op Pain Management: Minimal or no pain anticipated   Induction: Intravenous  PONV Risk Score and Plan: 3 and Ondansetron  and Dexamethasone   Airway Management Planned: Oral ETT  Additional Equipment: Arterial line  Intra-op Plan:   Post-operative Plan: Extubation in OR  Informed Consent: I have reviewed the patients History and Physical, chart, labs and  discussed the procedure including the risks, benefits and alternatives for the proposed anesthesia with the patient or authorized representative who has indicated his/her understanding and acceptance.     Dental advisory given and Interpreter used for interview  Plan Discussed with: CRNA  Anesthesia Plan Comments: (PAT note by Rudy Costain, PA-C: 78 yo female current smoker with associated COPD on Breztri , HTN, PSVT, paraesophageal hernia s/p repair, GERD on PPI, nonobstructive CAD (by CTA 07/2020).  Echo 06/2020 EF 70-75%, normal wall motion, grade 1 dd, normal RV function, mild-mod aortic calcification with no evidence of stenosis.   Angiogram 09/25/23 by Dr. Charlotte Cookey showed occluded right common femoral and proximal superficial femoral artery.  Istat 09/25/23 reviewed, mild hyponatremia sodium 132, otherwise unremarkable.   Pt will need DOS labs and eval.   EKG 09/13/22: Sinus rhythm. Rate 61.  Coronary CTA 08/24/20: IMPRESSION: 1. Coronary calcium  score of 509. This was 97 percentile for age and sex matched control.   2. Normal coronary origin with right dominance.   3. Mild CAD. CAD-RADS 2. Mild non-obstructive CAD (25-49%). Consider non-atherosclerotic causes of chest pain. Consider preventive therapy and risk factor modification.   4. Aortic Atherosclerosis.  TTE 07/23/20: 1. Left ventricular ejection fraction, by estimation, is 70 to 75%. The  left ventricle has hyperdynamic function. The left ventricle has no  regional wall motion abnormalities. There is mild left ventricular  hypertrophy. Left ventricular diastolic  parameters are consistent with Grade I diastolic dysfunction (impaired  relaxation).   2. Right ventricular systolic function is normal. The right ventricular  size is normal.   3. The mitral valve is normal in structure. No evidence of mitral valve  regurgitation. No evidence of mitral stenosis.   4. The aortic valve is normal in structure. There is moderate   calcification of the aortic valve. There is mild thickening of the aortic  valve. Aortic valve regurgitation is not visualized. Mild to moderate  aortic valve sclerosis/calcification is  present, without any evidence of aortic stenosis.   5. The inferior vena cava is normal in size with greater than 50%  respiratory variability, suggesting right atrial pressure of 3 mmHg.   )         Anesthesia Quick Evaluation

## 2023-09-27 NOTE — Progress Notes (Signed)
 Anesthesia Chart Review: Same day workup  78 yo female current smoker with associated COPD on Breztri , HTN, PSVT, paraesophageal hernia s/p repair, GERD on PPI, nonobstructive CAD (by CTA 07/2020).  Echo 06/2020 EF 70-75%, normal wall motion, grade 1 dd, normal RV function, mild-mod aortic calcification with no evidence of stenosis.   Angiogram 09/25/23 by Dr. Charlotte Cookey showed occluded right common femoral and proximal superficial femoral artery.  Istat 09/25/23 reviewed, mild hyponatremia sodium 132, otherwise unremarkable.   Pt will need DOS labs and eval.   EKG 09/13/22: Sinus rhythm. Rate 61.  Coronary CTA 08/24/20: IMPRESSION: 1. Coronary calcium  score of 509. This was 40 percentile for age and sex matched control.   2. Normal coronary origin with right dominance.   3. Mild CAD. CAD-RADS 2. Mild non-obstructive CAD (25-49%). Consider non-atherosclerotic causes of chest pain. Consider preventive therapy and risk factor modification.   4. Aortic Atherosclerosis.  TTE 07/23/20: 1. Left ventricular ejection fraction, by estimation, is 70 to 75%. The  left ventricle has hyperdynamic function. The left ventricle has no  regional wall motion abnormalities. There is mild left ventricular  hypertrophy. Left ventricular diastolic  parameters are consistent with Grade I diastolic dysfunction (impaired  relaxation).   2. Right ventricular systolic function is normal. The right ventricular  size is normal.   3. The mitral valve is normal in structure. No evidence of mitral valve  regurgitation. No evidence of mitral stenosis.   4. The aortic valve is normal in structure. There is moderate  calcification of the aortic valve. There is mild thickening of the aortic  valve. Aortic valve regurgitation is not visualized. Mild to moderate  aortic valve sclerosis/calcification is  present, without any evidence of aortic stenosis.   5. The inferior vena cava is normal in size with greater than 50%   respiratory variability, suggesting right atrial pressure of 3 mmHg.    Edilia Gordon [facility] Short Stay Center/Anesthesiology Phone [phone] 09/27/2023 11:56 AM

## 2023-09-28 ENCOUNTER — Encounter (HOSPITAL_COMMUNITY): Payer: Self-pay | Admitting: Surgery

## 2023-09-28 ENCOUNTER — Inpatient Hospital Stay (HOSPITAL_COMMUNITY)
Admission: RE | Admit: 2023-09-28 | Discharge: 2023-10-02 | DRG: 254 | Disposition: A | Discharge status: Home Health | Attending: Surgery | Admitting: Surgery

## 2023-09-28 ENCOUNTER — Other Ambulatory Visit: Payer: Self-pay

## 2023-09-28 ENCOUNTER — Inpatient Hospital Stay (HOSPITAL_COMMUNITY): Payer: Self-pay | Admitting: Physician Assistant

## 2023-09-28 ENCOUNTER — Encounter (HOSPITAL_COMMUNITY)
Admission: RE | Disposition: A | Discharge status: Home / Self Care | Payer: Self-pay | Source: Home / Self Care | Attending: Surgery

## 2023-09-28 DIAGNOSIS — E1151 Type 2 diabetes mellitus with diabetic peripheral angiopathy without gangrene: Secondary | ICD-10-CM | POA: Diagnosis not present

## 2023-09-28 DIAGNOSIS — Z8249 Family history of ischemic heart disease and other diseases of the circulatory system: Secondary | ICD-10-CM | POA: Diagnosis not present

## 2023-09-28 DIAGNOSIS — F419 Anxiety disorder, unspecified: Secondary | ICD-10-CM | POA: Diagnosis present

## 2023-09-28 DIAGNOSIS — Z9889 Other specified postprocedural states: Secondary | ICD-10-CM

## 2023-09-28 DIAGNOSIS — I951 Orthostatic hypotension: Secondary | ICD-10-CM | POA: Diagnosis not present

## 2023-09-28 DIAGNOSIS — K219 Gastro-esophageal reflux disease without esophagitis: Secondary | ICD-10-CM | POA: Diagnosis present

## 2023-09-28 DIAGNOSIS — Z823 Family history of stroke: Secondary | ICD-10-CM | POA: Diagnosis not present

## 2023-09-28 DIAGNOSIS — R609 Edema, unspecified: Secondary | ICD-10-CM | POA: Diagnosis not present

## 2023-09-28 DIAGNOSIS — J4489 Other specified chronic obstructive pulmonary disease: Secondary | ICD-10-CM | POA: Diagnosis present

## 2023-09-28 DIAGNOSIS — F32A Depression, unspecified: Secondary | ICD-10-CM | POA: Diagnosis present

## 2023-09-28 DIAGNOSIS — F17210 Nicotine dependence, cigarettes, uncomplicated: Secondary | ICD-10-CM | POA: Diagnosis not present

## 2023-09-28 DIAGNOSIS — I739 Peripheral vascular disease, unspecified: Principal | ICD-10-CM

## 2023-09-28 DIAGNOSIS — I743 Embolism and thrombosis of arteries of the lower extremities: Secondary | ICD-10-CM | POA: Diagnosis not present

## 2023-09-28 DIAGNOSIS — I2510 Atherosclerotic heart disease of native coronary artery without angina pectoris: Secondary | ICD-10-CM | POA: Diagnosis present

## 2023-09-28 DIAGNOSIS — M329 Systemic lupus erythematosus, unspecified: Secondary | ICD-10-CM | POA: Diagnosis present

## 2023-09-28 DIAGNOSIS — J449 Chronic obstructive pulmonary disease, unspecified: Secondary | ICD-10-CM | POA: Diagnosis not present

## 2023-09-28 DIAGNOSIS — Z96659 Presence of unspecified artificial knee joint: Secondary | ICD-10-CM | POA: Diagnosis present

## 2023-09-28 DIAGNOSIS — I70221 Atherosclerosis of native arteries of extremities with rest pain, right leg: Secondary | ICD-10-CM | POA: Diagnosis not present

## 2023-09-28 DIAGNOSIS — I77810 Thoracic aortic ectasia: Secondary | ICD-10-CM | POA: Diagnosis present

## 2023-09-28 DIAGNOSIS — Z79899 Other long term (current) drug therapy: Secondary | ICD-10-CM | POA: Diagnosis not present

## 2023-09-28 DIAGNOSIS — I70201 Unspecified atherosclerosis of native arteries of extremities, right leg: Secondary | ICD-10-CM

## 2023-09-28 DIAGNOSIS — E7800 Pure hypercholesterolemia, unspecified: Secondary | ICD-10-CM | POA: Diagnosis not present

## 2023-09-28 DIAGNOSIS — I10 Essential (primary) hypertension: Secondary | ICD-10-CM | POA: Diagnosis present

## 2023-09-28 DIAGNOSIS — I252 Old myocardial infarction: Secondary | ICD-10-CM

## 2023-09-28 DIAGNOSIS — I700 Atherosclerosis of aorta: Secondary | ICD-10-CM | POA: Diagnosis present

## 2023-09-28 DIAGNOSIS — G8929 Other chronic pain: Secondary | ICD-10-CM | POA: Diagnosis not present

## 2023-09-28 DIAGNOSIS — Z743 Need for continuous supervision: Secondary | ICD-10-CM | POA: Diagnosis not present

## 2023-09-28 DIAGNOSIS — Z7902 Long term (current) use of antithrombotics/antiplatelets: Secondary | ICD-10-CM

## 2023-09-28 DIAGNOSIS — I70223 Atherosclerosis of native arteries of extremities with rest pain, bilateral legs: Secondary | ICD-10-CM

## 2023-09-28 HISTORY — PX: VEIN HARVEST: SHX6363

## 2023-09-28 HISTORY — PX: PATCH ANGIOPLASTY: SHX6230

## 2023-09-28 HISTORY — PX: ENDARTERECTOMY FEMORAL: SHX5804

## 2023-09-28 HISTORY — DX: Peripheral vascular disease, unspecified: I73.9

## 2023-09-28 LAB — URINALYSIS, ROUTINE W REFLEX MICROSCOPIC
Bilirubin Urine: NEGATIVE
Glucose, UA: NEGATIVE mg/dL
Ketones, ur: NEGATIVE mg/dL
Leukocytes,Ua: NEGATIVE
Nitrite: NEGATIVE
Protein, ur: NEGATIVE mg/dL
Specific Gravity, Urine: 1.009 (ref 1.005–1.030)
pH: 7 (ref 5.0–8.0)

## 2023-09-28 LAB — CBC
HCT: 33.6 % — ABNORMAL LOW (ref 36.0–46.0)
HCT: 28.9 % — ABNORMAL LOW (ref 36.0–46.0)
Hemoglobin: 11.6 g/dL — ABNORMAL LOW (ref 12.0–15.0)
Hemoglobin: 10.2 g/dL — ABNORMAL LOW (ref 12.0–15.0)
MCH: 33.1 pg (ref 26.0–34.0)
MCH: 33.7 pg (ref 26.0–34.0)
MCHC: 34.5 g/dL (ref 30.0–36.0)
MCHC: 35.3 g/dL (ref 30.0–36.0)
MCV: 96 fL (ref 80.0–100.0)
MCV: 95.4 fL (ref 80.0–100.0)
Platelets: 152 10*3/uL (ref 150–400)
Platelets: 131 10*3/uL — ABNORMAL LOW (ref 150–400)
RBC: 3.5 MIL/uL — ABNORMAL LOW (ref 3.87–5.11)
RBC: 3.03 MIL/uL — ABNORMAL LOW (ref 3.87–5.11)
RDW: 13.5 % (ref 11.5–15.5)
RDW: 13.5 % (ref 11.5–15.5)
WBC: 5.6 10*3/uL (ref 4.0–10.5)
WBC: 5.9 10*3/uL (ref 4.0–10.5)
nRBC: 0 % (ref 0.0–0.2)
nRBC: 0 % (ref 0.0–0.2)

## 2023-09-28 LAB — COMPREHENSIVE METABOLIC PANEL WITH GFR
ALT: 12 U/L (ref 0–44)
AST: 16 U/L (ref 15–41)
Albumin: 3.3 g/dL — ABNORMAL LOW (ref 3.5–5.0)
Alkaline Phosphatase: 56 U/L (ref 38–126)
Anion gap: 7 (ref 5–15)
BUN: 6 mg/dL — ABNORMAL LOW (ref 8–23)
CO2: 29 mmol/L (ref 22–32)
Calcium: 9.2 mg/dL (ref 8.9–10.3)
Chloride: 97 mmol/L — ABNORMAL LOW (ref 98–111)
Creatinine, Ser: 0.7 mg/dL (ref 0.44–1.00)
GFR, Estimated: >60 mL/min (ref >60)
Glucose, Bld: 89 mg/dL (ref 70–99)
Potassium: 4.1 mmol/L (ref 3.5–5.1)
Sodium: 133 mmol/L — ABNORMAL LOW (ref 135–145)
Total Bilirubin: 1.1 mg/dL (ref 0.0–1.2)
Total Protein: 5.6 g/dL — ABNORMAL LOW (ref 6.5–8.1)

## 2023-09-28 LAB — GLUCOSE, CAPILLARY
Glucose-Capillary: 79 mg/dL (ref 70–99)
Glucose-Capillary: 99 mg/dL (ref 70–99)

## 2023-09-28 LAB — TYPE AND SCREEN
ABO/RH(D): O POS
Antibody Screen: NEGATIVE

## 2023-09-28 LAB — SURGICAL PCR SCREEN
MRSA, PCR: NEGATIVE
Staphylococcus aureus: NEGATIVE

## 2023-09-28 LAB — POCT ACTIVATED CLOTTING TIME
Activated Clotting Time: 228 s
Activated Clotting Time: 187 s

## 2023-09-28 LAB — PROTIME-INR
INR: 1 (ref 0.8–1.2)
Prothrombin Time: 13.1 s (ref 11.4–15.2)

## 2023-09-28 LAB — APTT: aPTT: 31 s (ref 24–36)

## 2023-09-28 LAB — CREATININE, SERUM
Creatinine, Ser: 0.59 mg/dL (ref 0.44–1.00)
GFR, Estimated: >60 mL/min (ref >60)

## 2023-09-28 LAB — ABO/RH: ABO/RH(D): O POS

## 2023-09-28 SURGERY — ENDARTERECTOMY, FEMORAL
Anesthesia: General | Site: Groin | Laterality: Right

## 2023-09-28 MED ORDER — CEFAZOLIN SODIUM-DEXTROSE 2-4 GM/100ML-% IV SOLN
2.0000 g | Freq: Three times a day (TID) | INTRAVENOUS | Status: AC
  Administered 2023-09-28 (×2): 2 g via INTRAVENOUS
  Filled 2023-09-28 (×2): qty 100

## 2023-09-28 MED ORDER — LIDOCAINE 2% (20 MG/ML) 5 ML SYRINGE
INTRAMUSCULAR | Status: DC | PRN
  Administered 2023-09-28: 40 mg via INTRAVENOUS
  Administered 2023-09-28: 50 mg via INTRAVENOUS

## 2023-09-28 MED ORDER — SODIUM CHLORIDE 0.9 % IV SOLN: INTRAVENOUS | Status: AC

## 2023-09-28 MED ORDER — PROPOFOL 10 MG/ML IV BOLUS
INTRAVENOUS | Status: DC | PRN
  Administered 2023-09-28: 20 mg via INTRAVENOUS
  Administered 2023-09-28: 110 mg via INTRAVENOUS

## 2023-09-28 MED ORDER — SODIUM CHLORIDE 0.9 % IV SOLN: INTRAVENOUS | Status: DC

## 2023-09-28 MED ORDER — LISINOPRIL 20 MG PO TABS
40.0000 mg | ORAL_TABLET | Freq: Every day | ORAL | Status: DC
  Administered 2023-09-28 – 2023-10-02 (×4): 40 mg via ORAL
  Filled 2023-09-28 (×4): qty 2

## 2023-09-28 MED ORDER — PROTAMINE SULFATE 10 MG/ML IV SOLN
INTRAVENOUS | Status: DC | PRN
  Administered 2023-09-28: 50 mg via INTRAVENOUS

## 2023-09-28 MED ORDER — HEPARIN SODIUM (PORCINE) 1000 UNIT/ML IJ SOLN
INTRAMUSCULAR | Status: AC
  Filled 2023-09-28: qty 10

## 2023-09-28 MED ORDER — SUGAMMADEX SODIUM 200 MG/2ML IV SOLN
INTRAVENOUS | Status: DC | PRN
  Administered 2023-09-28: 200 mg via INTRAVENOUS

## 2023-09-28 MED ORDER — CHLORHEXIDINE GLUCONATE CLOTH 2 % EX PADS: 6.0000 | MEDICATED_PAD | Freq: Once | CUTANEOUS | Status: DC

## 2023-09-28 MED ORDER — CLEVIDIPINE BUTYRATE 0.5 MG/ML IV EMUL
INTRAVENOUS | Status: AC
  Filled 2023-09-28: qty 50

## 2023-09-28 MED ORDER — TRAZODONE HCL 100 MG PO TABS
100.0000 mg | ORAL_TABLET | Freq: Every day | ORAL | Status: DC
  Administered 2023-09-28 – 2023-10-01 (×4): 100 mg via ORAL
  Filled 2023-09-28 (×4): qty 1

## 2023-09-28 MED ORDER — EPHEDRINE 5 MG/ML INJ
INTRAVENOUS | Status: AC
  Filled 2023-09-28: qty 5

## 2023-09-28 MED ORDER — ORAL CARE MOUTH RINSE: 15.0000 mL | Freq: Once | OROMUCOSAL | Status: AC

## 2023-09-28 MED ORDER — ROCURONIUM BROMIDE 10 MG/ML (PF) SYRINGE
PREFILLED_SYRINGE | INTRAVENOUS | Status: AC
  Filled 2023-09-28: qty 10

## 2023-09-28 MED ORDER — PANTOPRAZOLE SODIUM 40 MG PO TBEC
40.0000 mg | DELAYED_RELEASE_TABLET | Freq: Every day | ORAL | Status: DC
  Administered 2023-09-28 – 2023-10-02 (×5): 40 mg via ORAL
  Filled 2023-09-28 (×5): qty 1

## 2023-09-28 MED ORDER — ONDANSETRON HCL 4 MG/2ML IJ SOLN
INTRAMUSCULAR | Status: DC | PRN
  Administered 2023-09-28: 4 mg via INTRAVENOUS

## 2023-09-28 MED ORDER — POTASSIUM CHLORIDE CRYS ER 20 MEQ PO TBCR: 20.0000 meq | EXTENDED_RELEASE_TABLET | Freq: Every day | ORAL | Status: DC | PRN

## 2023-09-28 MED ORDER — PHENYLEPHRINE HCL (PRESSORS) 10 MG/ML IV SOLN
INTRAVENOUS | Status: DC | PRN
  Administered 2023-09-28: 80 ug via INTRAVENOUS
  Administered 2023-09-28 (×2): 40 ug via INTRAVENOUS

## 2023-09-28 MED ORDER — CLEVIDIPINE BUTYRATE 0.5 MG/ML IV EMUL
INTRAVENOUS | Status: DC | PRN
  Administered 2023-09-28: 1 mg/h via INTRAVENOUS

## 2023-09-28 MED ORDER — HEPARIN SODIUM (PORCINE) 5000 UNIT/ML IJ SOLN
5000.0000 [IU] | Freq: Three times a day (TID) | INTRAMUSCULAR | Status: DC
  Administered 2023-09-29 – 2023-10-01 (×9): 5000 [IU] via SUBCUTANEOUS
  Filled 2023-09-28 (×9): qty 1

## 2023-09-28 MED ORDER — LACTATED RINGERS IV SOLN: INTRAVENOUS | Status: DC

## 2023-09-28 MED ORDER — PROTAMINE SULFATE 10 MG/ML IV SOLN
INTRAVENOUS | Status: AC
  Filled 2023-09-28: qty 5

## 2023-09-28 MED ORDER — ROCURONIUM BROMIDE 10 MG/ML (PF) SYRINGE
PREFILLED_SYRINGE | INTRAVENOUS | Status: DC | PRN
  Administered 2023-09-28: 20 mg via INTRAVENOUS
  Administered 2023-09-28: 10 mg via INTRAVENOUS
  Administered 2023-09-28: 50 mg via INTRAVENOUS
  Administered 2023-09-28 (×3): 10 mg via INTRAVENOUS

## 2023-09-28 MED ORDER — METOPROLOL TARTRATE 5 MG/5ML IV SOLN: 2.0000 mg | INTRAVENOUS | Status: DC | PRN

## 2023-09-28 MED ORDER — FENTANYL CITRATE (PF) 100 MCG/2ML IJ SOLN
25.0000 ug | INTRAMUSCULAR | Status: DC | PRN
  Administered 2023-09-28 (×2): 50 ug via INTRAVENOUS

## 2023-09-28 MED ORDER — ALBUTEROL SULFATE (2.5 MG/3ML) 0.083% IN NEBU: 2.5000 mg | INHALATION_SOLUTION | Freq: Four times a day (QID) | RESPIRATORY_TRACT | Status: DC | PRN

## 2023-09-28 MED ORDER — PROPOFOL 10 MG/ML IV BOLUS
INTRAVENOUS | Status: AC
  Filled 2023-09-28: qty 20

## 2023-09-28 MED ORDER — HYDROCHLOROTHIAZIDE 25 MG PO TABS
25.0000 mg | ORAL_TABLET | Freq: Every day | ORAL | Status: DC
  Administered 2023-09-28: 25 mg via ORAL
  Filled 2023-09-28: qty 1

## 2023-09-28 MED ORDER — ALBUTEROL SULFATE HFA 108 (90 BASE) MCG/ACT IN AERS
INHALATION_SPRAY | RESPIRATORY_TRACT | Status: DC | PRN
  Administered 2023-09-28: 8 via RESPIRATORY_TRACT

## 2023-09-28 MED ORDER — FENTANYL CITRATE (PF) 100 MCG/2ML IJ SOLN
INTRAMUSCULAR | Status: AC
Start: 2023-09-28 — End: 2023-09-28
  Filled 2023-09-28: qty 2

## 2023-09-28 MED ORDER — CEFAZOLIN SODIUM-DEXTROSE 2-4 GM/100ML-% IV SOLN
2.0000 g | INTRAVENOUS | Status: AC
  Administered 2023-09-28: 2 g via INTRAVENOUS
  Filled 2023-09-28: qty 100

## 2023-09-28 MED ORDER — ONDANSETRON HCL 4 MG/2ML IJ SOLN
INTRAMUSCULAR | Status: AC
  Filled 2023-09-28: qty 2

## 2023-09-28 MED ORDER — HEPARIN 6000 UNIT IRRIGATION SOLUTION
Status: DC | PRN
  Administered 2023-09-28: 1

## 2023-09-28 MED ORDER — SURGIFLO WITH THROMBIN (HEMOSTATIC MATRIX KIT) OPTIME
TOPICAL | Status: DC | PRN
  Administered 2023-09-28 (×2): 1 via TOPICAL

## 2023-09-28 MED ORDER — LIDOCAINE 2% (20 MG/ML) 5 ML SYRINGE
INTRAMUSCULAR | Status: AC
  Filled 2023-09-28: qty 5

## 2023-09-28 MED ORDER — MIDAZOLAM HCL 5 MG/5ML IJ SOLN
INTRAMUSCULAR | Status: DC | PRN
  Administered 2023-09-28: 1 mg via INTRAVENOUS

## 2023-09-28 MED ORDER — ASPIRIN 81 MG PO TBEC
81.0000 mg | DELAYED_RELEASE_TABLET | Freq: Every day | ORAL | Status: DC
  Administered 2023-09-28 – 2023-10-02 (×5): 81 mg via ORAL
  Filled 2023-09-28 (×5): qty 1

## 2023-09-28 MED ORDER — ROSUVASTATIN CALCIUM 5 MG PO TABS
10.0000 mg | ORAL_TABLET | Freq: Every day | ORAL | Status: DC
  Administered 2023-09-28: 10 mg via ORAL
  Filled 2023-09-28: qty 2

## 2023-09-28 MED ORDER — HYDRALAZINE HCL 20 MG/ML IJ SOLN: 5.0000 mg | INTRAMUSCULAR | Status: DC | PRN

## 2023-09-28 MED ORDER — PHENYLEPHRINE 80 MCG/ML (10ML) SYRINGE FOR IV PUSH (FOR BLOOD PRESSURE SUPPORT)
PREFILLED_SYRINGE | INTRAVENOUS | Status: AC
  Filled 2023-09-28: qty 10

## 2023-09-28 MED ORDER — GUAIFENESIN-DM 100-10 MG/5ML PO SYRP: 15.0000 mL | ORAL_SOLUTION | ORAL | Status: DC | PRN

## 2023-09-28 MED ORDER — ACETAMINOPHEN 10 MG/ML IV SOLN: 1000.0000 mg | Freq: Once | INTRAVENOUS | Status: DC | PRN

## 2023-09-28 MED ORDER — ACETAMINOPHEN 650 MG RE SUPP: 325.0000 mg | RECTAL | Status: DC | PRN

## 2023-09-28 MED ORDER — LABETALOL HCL 5 MG/ML IV SOLN: 10.0000 mg | INTRAVENOUS | Status: DC | PRN

## 2023-09-28 MED ORDER — OXYCODONE HCL 5 MG/5ML PO SOLN: 5.0000 mg | Freq: Once | ORAL | Status: DC | PRN

## 2023-09-28 MED ORDER — PHENYLEPHRINE HCL-NACL 20-0.9 MG/250ML-% IV SOLN
INTRAVENOUS | Status: DC | PRN
  Administered 2023-09-28: 25 ug/min via INTRAVENOUS

## 2023-09-28 MED ORDER — CHLORHEXIDINE GLUCONATE 0.12 % MT SOLN
15.0000 mL | Freq: Once | OROMUCOSAL | Status: AC
  Administered 2023-09-28: 15 mL via OROMUCOSAL
  Filled 2023-09-28: qty 15

## 2023-09-28 MED ORDER — HEPARIN 6000 UNIT IRRIGATION SOLUTION
Status: AC
  Filled 2023-09-28: qty 500

## 2023-09-28 MED ORDER — LABETALOL HCL 5 MG/ML IV SOLN
INTRAVENOUS | Status: DC | PRN
  Administered 2023-09-28: 5 mg via INTRAVENOUS

## 2023-09-28 MED ORDER — HEPARIN SODIUM (PORCINE) 1000 UNIT/ML IJ SOLN
INTRAMUSCULAR | Status: DC | PRN
  Administered 2023-09-28: 1000 [IU] via INTRAVENOUS
  Administered 2023-09-28: 6000 [IU] via INTRAVENOUS
  Administered 2023-09-28: 2000 [IU] via INTRAVENOUS

## 2023-09-28 MED ORDER — EPHEDRINE SULFATE-NACL 50-0.9 MG/10ML-% IV SOSY
PREFILLED_SYRINGE | INTRAVENOUS | Status: DC | PRN
  Administered 2023-09-28 (×3): 2.5 mg via INTRAVENOUS

## 2023-09-28 MED ORDER — SODIUM CHLORIDE 0.9 % IV SOLN
500.0000 mL | Freq: Once | INTRAVENOUS | Status: AC | PRN
  Administered 2023-09-29: 500 mL via INTRAVENOUS

## 2023-09-28 MED ORDER — MORPHINE SULFATE (PF) 4 MG/ML IV SOLN
4.0000 mg | INTRAVENOUS | Status: DC | PRN
  Administered 2023-10-01: 4 mg via INTRAVENOUS
  Filled 2023-09-28: qty 1

## 2023-09-28 MED ORDER — ISOSORBIDE MONONITRATE ER 30 MG PO TB24
30.0000 mg | ORAL_TABLET | Freq: Every day | ORAL | Status: DC
  Administered 2023-09-28 – 2023-10-02 (×5): 30 mg via ORAL
  Filled 2023-09-28 (×5): qty 1

## 2023-09-28 MED ORDER — 0.9 % SODIUM CHLORIDE (POUR BTL) OPTIME
TOPICAL | Status: DC | PRN
  Administered 2023-09-28 (×2): 1000 mL

## 2023-09-28 MED ORDER — LABETALOL HCL 5 MG/ML IV SOLN
INTRAVENOUS | Status: AC
  Filled 2023-09-28: qty 4

## 2023-09-28 MED ORDER — OXYCODONE-ACETAMINOPHEN 5-325 MG PO TABS
1.0000 | ORAL_TABLET | ORAL | Status: DC | PRN
  Administered 2023-09-28 (×2): 1 via ORAL
  Administered 2023-09-29: 2 via ORAL
  Administered 2023-09-29 (×2): 1 via ORAL
  Administered 2023-09-30 – 2023-10-01 (×3): 2 via ORAL
  Filled 2023-09-28 (×2): qty 2
  Filled 2023-09-28 (×2): qty 1
  Filled 2023-09-28: qty 2
  Filled 2023-09-28: qty 1
  Filled 2023-09-28: qty 2
  Filled 2023-09-28: qty 1

## 2023-09-28 MED ORDER — FENTANYL CITRATE (PF) 250 MCG/5ML IJ SOLN
INTRAMUSCULAR | Status: AC
  Filled 2023-09-28: qty 5

## 2023-09-28 MED ORDER — MAGNESIUM SULFATE 2 GM/50ML IV SOLN: 2.0000 g | Freq: Every day | INTRAVENOUS | Status: DC | PRN

## 2023-09-28 MED ORDER — OXYCODONE HCL 5 MG PO TABS: 5.0000 mg | ORAL_TABLET | Freq: Once | ORAL | Status: DC | PRN

## 2023-09-28 MED ORDER — CLONIDINE HCL 0.1 MG PO TABS
0.1000 mg | ORAL_TABLET | Freq: Two times a day (BID) | ORAL | Status: DC
  Administered 2023-09-28 – 2023-10-02 (×7): 0.1 mg via ORAL
  Filled 2023-09-28 (×8): qty 1

## 2023-09-28 MED ORDER — LACTATED RINGERS IV SOLN: INTRAVENOUS | Status: DC | PRN

## 2023-09-28 MED ORDER — PHENYLEPHRINE 80 MCG/ML (10ML) SYRINGE FOR IV PUSH (FOR BLOOD PRESSURE SUPPORT)
PREFILLED_SYRINGE | INTRAVENOUS | Status: DC | PRN
  Administered 2023-09-28: 80 ug via INTRAVENOUS

## 2023-09-28 MED ORDER — ALUM & MAG HYDROXIDE-SIMETH 200-200-20 MG/5ML PO SUSP: 15.0000 mL | ORAL | Status: DC | PRN

## 2023-09-28 MED ORDER — FENTANYL CITRATE (PF) 250 MCG/5ML IJ SOLN
INTRAMUSCULAR | Status: DC | PRN
  Administered 2023-09-28: 100 ug via INTRAVENOUS
  Administered 2023-09-28 (×3): 50 ug via INTRAVENOUS

## 2023-09-28 MED ORDER — LISINOPRIL-HYDROCHLOROTHIAZIDE 20-12.5 MG PO TABS: 2.0000 | ORAL_TABLET | Freq: Every day | ORAL | Status: DC

## 2023-09-28 MED ORDER — BUPROPION HCL ER (XL) 150 MG PO TB24
150.0000 mg | ORAL_TABLET | Freq: Every morning | ORAL | Status: DC
Start: 2023-09-28 — End: 2023-10-02
  Administered 2023-09-28 – 2023-10-02 (×5): 150 mg via ORAL
  Filled 2023-09-28 (×5): qty 1

## 2023-09-28 MED ORDER — MIDAZOLAM HCL 2 MG/2ML IJ SOLN
INTRAMUSCULAR | Status: AC
  Filled 2023-09-28: qty 2

## 2023-09-28 MED ORDER — DEXAMETHASONE SODIUM PHOSPHATE 10 MG/ML IJ SOLN
INTRAMUSCULAR | Status: DC | PRN
Start: 2023-09-28 — End: 2023-09-28
  Administered 2023-09-28: 4 mg via INTRAVENOUS

## 2023-09-28 MED ORDER — PHENOL 1.4 % MT LIQD: 1.0000 | OROMUCOSAL | Status: DC | PRN

## 2023-09-28 MED ORDER — DOCUSATE SODIUM 100 MG PO CAPS
100.0000 mg | ORAL_CAPSULE | Freq: Every day | ORAL | Status: DC
  Administered 2023-09-29: 100 mg via ORAL
  Filled 2023-09-28 (×2): qty 1

## 2023-09-28 MED ORDER — DEXAMETHASONE SODIUM PHOSPHATE 10 MG/ML IJ SOLN
INTRAMUSCULAR | Status: AC
  Filled 2023-09-28: qty 1

## 2023-09-28 MED ORDER — ONDANSETRON HCL 4 MG/2ML IJ SOLN
4.0000 mg | Freq: Four times a day (QID) | INTRAMUSCULAR | Status: DC | PRN
  Administered 2023-10-02: 4 mg via INTRAVENOUS
  Filled 2023-09-28: qty 2

## 2023-09-28 MED ORDER — ACETAMINOPHEN 325 MG PO TABS: 325.0000 mg | ORAL_TABLET | ORAL | Status: DC | PRN

## 2023-09-28 SURGICAL SUPPLY — 43 items
BAG COUNTER SPONGE SURGICOUNT (BAG) ×1 IMPLANT
BENZOIN TINCTURE PRP APPL 2/3 (GAUZE/BANDAGES/DRESSINGS) IMPLANT
CANISTER SUCT 3000ML PPV (MISCELLANEOUS) ×1 IMPLANT
CANNULA VESSEL 3MM 2 BLNT TIP (CANNULA) IMPLANT
CATH EMB 4FR 40 (CATHETERS) IMPLANT
CLIP TI MEDIUM 24 (CLIP) ×1 IMPLANT
CLIP TI WIDE RED SMALL 24 (CLIP) ×1 IMPLANT
DERMABOND ADVANCED .7 DNX12 (GAUZE/BANDAGES/DRESSINGS) ×1 IMPLANT
DRAIN CHANNEL 15F RND FF W/TCR (WOUND CARE) IMPLANT
DRAPE X-RAY CASS 24X20 (DRAPES) IMPLANT
DRESSING PEEL AND PLC PRVNA 13 (GAUZE/BANDAGES/DRESSINGS) IMPLANT
ELECTRODE REM PT RTRN 9FT ADLT (ELECTROSURGICAL) ×1 IMPLANT
EVACUATOR SILICONE 100CC (DRAIN) IMPLANT
GLOVE SURG SS PI 7.5 STRL IVOR (GLOVE) ×3 IMPLANT
GOWN STRL REUS W/ TWL LRG LVL3 (GOWN DISPOSABLE) ×2 IMPLANT
GOWN STRL REUS W/ TWL XL LVL3 (GOWN DISPOSABLE) ×1 IMPLANT
HEMOSTAT SNOW SURGICEL 2X4 (HEMOSTASIS) IMPLANT
KIT BASIN OR (CUSTOM PROCEDURE TRAY) ×1 IMPLANT
KIT DRSG PREVENA PLUS 7DAY 125 (MISCELLANEOUS) IMPLANT
KIT TURNOVER KIT B (KITS) ×1 IMPLANT
NS IRRIG 1000ML POUR BTL (IV SOLUTION) ×2 IMPLANT
PACK PERIPHERAL VASCULAR (CUSTOM PROCEDURE TRAY) ×1 IMPLANT
PAD ARMBOARD POSITIONER FOAM (MISCELLANEOUS) ×2 IMPLANT
SET COLLECT BLD 21X3/4 12 (NEEDLE) IMPLANT
SET WALTER ACTIVATION W/DRAPE (SET/KITS/TRAYS/PACK) IMPLANT
SPONGE INTESTINAL PEANUT (DISPOSABLE) ×1 IMPLANT
SPONGE T-LAP 18X18 ~~LOC~~+RFID (SPONGE) IMPLANT
STOPCOCK 4 WAY LG BORE MALE ST (IV SETS) IMPLANT
SURGIFLO W/THROMBIN 8M KIT (HEMOSTASIS) IMPLANT
SUT ETHILON 3 0 PS 1 (SUTURE) IMPLANT
SUT PROLENE 5 0 C 1 24 (SUTURE) ×1 IMPLANT
SUT PROLENE 5 0 C 1 36 (SUTURE) IMPLANT
SUT PROLENE 6 0 BV (SUTURE) ×1 IMPLANT
SUT PROLENE 7 0 BV1 MDA (SUTURE) IMPLANT
SUT VIC AB 2-0 CT1 TAPERPNT 27 (SUTURE) ×1 IMPLANT
SUT VIC AB 3-0 SH 27X BRD (SUTURE) ×1 IMPLANT
SUT VIC AB 3-0 X1 27 (SUTURE) ×1 IMPLANT
SUT VIC AB 4-0 PS2 18 (SUTURE) IMPLANT
SYR 3ML LL SCALE MARK (SYRINGE) IMPLANT
TOWEL GREEN STERILE (TOWEL DISPOSABLE) ×1 IMPLANT
TUBING EXTENTION W/L.L. (IV SETS) IMPLANT
UNDERPAD 30X36 HEAVY ABSORB (UNDERPADS AND DIAPERS) ×1 IMPLANT
WATER STERILE IRR 1000ML POUR (IV SOLUTION) ×1 IMPLANT

## 2023-09-28 NOTE — Op Note (Signed)
 Patient name: [name] [name] MRN: [id number] DOB: [birth year] Sex: female  09/28/2023 Pre-operative Diagnosis: Right Leg rest pain Post-operative diagnosis:  Same Surgeon:  [name] Assistants:  Naida Austria, PA Procedure:   #1:  right ilio-femoral endarterectomy with vein patch angioplsty   #2:  Right superficial femoral artery thrombectomy   #3:  right great saphenous vein harvest   #4:  Provena wound vac, right groin Anesthesia:  General Blood Loss:  200 cc Specimens:  none  Findings: The right common femoral artery was occluded.  There was subacute thrombus within the superficial femoral artery that was able to be removed with a Fogarty catheter.  Endarterectomy was performed from the distal external iliac artery down through the proximal superficial femoral artery.  Eversion endarterectomy was performed of the profundofemoral artery.  A vein patch was used for patch angioplasty.  The length of the patch was 8 cm  Indications: This is a 78 year old female with right leg rest pain.  Angiography revealed an occluded common femoral and proximal superficial femoral artery.  She comes in today for surgical repair  Procedure:  The patient was identified in the holding area and taken to [facility] OR ROOM 16  The patient was then placed supine on the table. general anesthesia was administered.  The patient was prepped and draped in the usual sterile fashion.  A time out was called and antibiotics were administered.  An experienced assistant was necessary due to the complexity of the case.  He helped with exposure by providing suction and retraction.  He helped with the anastomosis by following the suture.  A longitudinal incision was made in the right groin.  Cautery was used to divide the subcutaneous tissue down to the femoral sheath which was opened sharply.  I exposed the distal external iliac artery under the inguinal ligament.  There was a crossing vein posteriorly which was inadvertently injured  and had to be ligated with Prolene suture.  I then exposed the profundofemoral artery including its 2 primary branches.  I exposed approximately 12 cm of the superficial femoral artery.  I then dissected out the saphenous vein from the saphenofemoral junction distally.  The patient was fully heparinized.  Vascular clamps were used to occlude the vessels and a #11 blade was used to make an arteriotomy in the common femoral artery which was opened longitudinally with Potts scissors up to the circumflex vessels.  There was significant calcific plaque creating a complete occlusion.  I continue to open the superficial femoral artery and identified subacute thrombus.  I passed a #4 Fogarty catheter distally and evacuated all of the thrombus and establish good backbleeding.  A negative pass was performed.  I then proceeded with endarterectomy including eversion endarterectomy of the profundofemoral artery.  I was able to get a good endpoint in the profunda and superficial femoral artery.  I did tack down the plaque in the superficial femoral artery with 7-0 Prolene.  Next, the Fogarty catheter was inserted into the external iliac artery and the balloon was inflated send I made sure that there was artery.  There was excellent flow.  I then ligated the saphenous vein proximally and distally.  It distended nicely with heparinized saline.  I then opened this with Potts scissors.  This was used for a long patch.  Patch angioplasty was performed with a running 5-0 Prolene.  The length of the patch was 8 cm.  Prior to completion, the appropriate flushing maneuvers were performed and the anastomosis  was completed.  Sequential de-clamping was performed.  Several repair stitches were required for hemostasis.  There were excellent signals in the superficial femoral and profundofemoral artery.  The patient's heparin  was reversed with 50 mg of protamine  wound was irrigated.  Hemostasis was achieved.  I reapproximated the femoral  sheath with running 2-0 Vicryl.  The subcutaneous tissue was closed with 3-0 Vicryl.  The skin was closed with 4-0 Vicryl.  A Prevena wound VAC was placed.  The patient was successfully extubated taken recovery room in stable condition.  There were no immediate complications   Disposition:  To PACU stable   V. [name], M.D., [facility] [place] Office: [phone] Pager:  [phone]

## 2023-09-28 NOTE — Anesthesia Procedure Notes (Addendum)
 Procedure Name: Intubation Date/Time: 09/28/2023 8:00 AM  Performed by: Eugena Herter, RNPre-anesthesia Checklist: Patient identified, Emergency Drugs available, Suction available, Patient being monitored and Timeout performed Patient Re-evaluated:Patient Re-evaluated prior to induction Oxygen  Delivery Method: Circle system utilized Preoxygenation: Pre-oxygenation with 100% oxygen  Induction Type: IV induction Ventilation: Two handed mask ventilation required and Oral airway inserted - appropriate to patient size Laryngoscope Size: Miller Grade View: Grade I Tube type: Oral Number of attempts: 1 Airway Equipment and Method: Stylet and Oral airway Placement Confirmation: ETT inserted through vocal cords under direct vision, positive ETCO2 and breath sounds checked- equal and bilateral Secured at: 22 cm Tube secured with: Tape Dental Injury: Teeth and Oropharynx as per pre-operative assessment

## 2023-09-28 NOTE — Progress Notes (Signed)
  Progress Note    09/28/2023 3:13 PM * Day of Surgery *  Subjective:  Continues to have right foot pain and occasional numbness   Vitals:   09/28/23 1145 09/28/23 1344  BP: (!) 114/48 (!) 114/48  Pulse: (!) 59   Resp: 12   Temp: 98.1 F (36.7 C)   SpO2: 95%    Physical Exam: Lungs:  non labored Incisions:  R groin with incisional vac, good seal Extremities:  palpable R ATA Neurologic: A&O  CBC    Component Value Date/Time   WBC 5.9 09/28/2023 1229   RBC 3.03 (L) 09/28/2023 1229   HGB 10.2 (L) 09/28/2023 1229   HGB 12.1 08/14/2023 1139   HCT 28.9 (L) 09/28/2023 1229   HCT 35.7 08/14/2023 1139   PLT 131 (L) 09/28/2023 1229   PLT 249 08/14/2023 1139   MCV 95.4 09/28/2023 1229   MCV 96 08/14/2023 1139   MCH 33.7 09/28/2023 1229   MCHC 35.3 09/28/2023 1229   RDW 13.5 09/28/2023 1229   RDW 11.9 08/14/2023 1139   LYMPHSABS 1.8 09/18/2023 1703   LYMPHSABS 2.1 08/14/2023 1139   MONOABS 0.5 09/18/2023 1703   EOSABS 0.1 09/18/2023 1703   EOSABS 0.1 08/14/2023 1139   BASOSABS 0.1 09/18/2023 1703   BASOSABS 0.1 08/14/2023 1139    BMET    Component Value Date/Time   NA 133 (L) 09/28/2023 0550   NA 138 08/14/2023 1139   K 4.1 09/28/2023 0550   CL 97 (L) 09/28/2023 0550   CO2 29 09/28/2023 0550   GLUCOSE 89 09/28/2023 0550   BUN 6 (L) 09/28/2023 0550   BUN 9 08/14/2023 1139   CREATININE 0.59 09/28/2023 1229   CALCIUM  9.2 09/28/2023 0550   GFRNONAA >60 09/28/2023 1229   GFRAA 100 07/14/2020 1442    INR    Component Value Date/Time   INR 1.0 09/28/2023 0550     Intake/Output Summary (Last 24 hours) at 09/28/2023 1513 Last data filed at 09/28/2023 1053 Gross per 24 hour  Intake 1280 ml  Output 30 ml  Net 1250 ml     Assessment/Plan:  78 y.o. female is s/p extensive iliofemoral endarterectomy with vein patch angioplasty  R foot well perfused with palpable ATA pulse R groin with incisional vac functioning appropriately Patient has R foot pain and  occasional numbness which is stable compared to pre-op Home likely Sunday or Monday   Cordie Deters, PA-C [facility] [phone] 09/28/2023 3:13 PM

## 2023-09-28 NOTE — Interval H&P Note (Signed)
 History and Physical Interval Note:  09/28/2023 7:29 AM  [name] [name]  has presented today for surgery, with the diagnosis of athrosclerosis RLE.  The various methods of treatment have been discussed with the patient and family. After consideration of risks, benefits and other options for treatment, the patient has consented to  Procedure(s): ENDARTERECTOMY, FEMORAL (Right) as a surgical intervention.  The patient's history has been reviewed, patient examined, no change in status, stable for surgery.  I have reviewed the patient's chart and labs.  Questions were answered to the patient's satisfaction.     [name]

## 2023-09-28 NOTE — Anesthesia Procedure Notes (Signed)
 Arterial Line Insertion Start/End5/06/2023 6:50 AM, 09/28/2023 7:00 AM Performed by: Arvie Latus, MD, Eugena Herter, RN, CRNA  Patient location: Pre-op. Preanesthetic checklist: patient identified, IV checked, site marked, risks and benefits discussed, surgical consent, monitors and equipment checked, pre-op evaluation, timeout performed and anesthesia consent Lidocaine  1% used for infiltration and patient sedated Left, radial was placed Catheter size: 20 G Hand hygiene performed , maximum sterile barriers used  and Seldinger technique used Allen's test indicative of satisfactory collateral circulation Attempts: 1 Procedure performed using ultrasound guided technique. Ultrasound Notes:anatomy identified, needle tip was noted to be adjacent to the nerve/plexus identified and no ultrasound evidence of intravascular and/or intraneural injection Following insertion, dressing applied and Biopatch. Post procedure assessment: normal and unchanged  Patient tolerated the procedure well with no immediate complications.

## 2023-09-28 NOTE — Transfer of Care (Signed)
 Immediate Anesthesia Transfer of Care Note  Patient: [name] [name]  Procedure(s) Performed: ENDARTERECTOMY, FEMORAL (Right: Groin) RIGHT FEMORAL ANGIOPLASTY, USING GREATER SAPHENOUS VEIN (Right: Groin) SURGICAL PROCUREMENT, RIGHT GREATER SAPHENOUS VEIN (Right: Groin)  Patient Location: PACU  Anesthesia Type:General  Level of Consciousness: awake, alert , and oriented  Airway & Oxygen  Therapy: Patient Spontanous Breathing and Patient connected to face mask oxygen   Post-op Assessment: Report given to RN, Post -op Vital signs reviewed and stable, Patient moving all extremities X 4, and Patient able to stick tongue midline  Post vital signs: Reviewed and stable  Last Vitals:  Vitals Value Taken Time  BP 141/60 09/28/23 1056  Temp 97.9   Pulse 63 09/28/23 1059  Resp 19 09/28/23 1059  SpO2 97 % 09/28/23 1059  Vitals shown include unfiled device data.  Last Pain:  Vitals:   09/28/23 0636  TempSrc:   PainSc: 10-Worst pain ever      Patients Stated Pain Goal: 5 (09/28/23 0636)  Complications: No notable events documented.

## 2023-09-29 DIAGNOSIS — I9581 Postprocedural hypotension: Secondary | ICD-10-CM

## 2023-09-29 LAB — LIPID PANEL
Cholesterol: 96 mg/dL (ref 0–200)
HDL: 57 mg/dL
LDL Cholesterol: 33 mg/dL (ref 0–99)
Total CHOL/HDL Ratio: 1.7 ratio
Triglycerides: 32 mg/dL
VLDL: 6 mg/dL (ref 0–40)

## 2023-09-29 LAB — CBC
HCT: 27.7 % — ABNORMAL LOW (ref 36.0–46.0)
Hemoglobin: 9.4 g/dL — ABNORMAL LOW (ref 12.0–15.0)
MCH: 32.4 pg (ref 26.0–34.0)
MCHC: 33.9 g/dL (ref 30.0–36.0)
MCV: 95.5 fL (ref 80.0–100.0)
Platelets: 132 10*3/uL — ABNORMAL LOW (ref 150–400)
RBC: 2.9 MIL/uL — ABNORMAL LOW (ref 3.87–5.11)
RDW: 13.5 % (ref 11.5–15.5)
WBC: 6.4 10*3/uL (ref 4.0–10.5)
nRBC: 0 % (ref 0.0–0.2)

## 2023-09-29 LAB — BASIC METABOLIC PANEL WITH GFR
Anion gap: 8 (ref 5–15)
BUN: 6 mg/dL — ABNORMAL LOW (ref 8–23)
CO2: 28 mmol/L (ref 22–32)
Calcium: 8.5 mg/dL — ABNORMAL LOW (ref 8.9–10.3)
Chloride: 98 mmol/L (ref 98–111)
Creatinine, Ser: 0.65 mg/dL (ref 0.44–1.00)
GFR, Estimated: >60 mL/min (ref >60)
Glucose, Bld: 93 mg/dL (ref 70–99)
Potassium: 3.4 mmol/L — ABNORMAL LOW (ref 3.5–5.1)
Sodium: 134 mmol/L — ABNORMAL LOW (ref 135–145)

## 2023-09-29 MED ORDER — ROSUVASTATIN CALCIUM 20 MG PO TABS
20.0000 mg | ORAL_TABLET | Freq: Every day | ORAL | Status: DC
  Administered 2023-09-29 – 2023-10-01 (×3): 20 mg via ORAL
  Filled 2023-09-29 (×3): qty 1

## 2023-09-29 MED ORDER — SODIUM CHLORIDE 0.9 % IV SOLN: 500.0000 mL | Freq: Once | INTRAVENOUS | Status: DC | PRN

## 2023-09-29 NOTE — Progress Notes (Signed)
      Call by RN for orthostatic hypotensive event.   PT worked with patient and when she stood up she became dizzy and her BP dropped   BP in Supine- 141/53 BP seated in recliner- 54/36, 53 BPM BP seated in recliner after 3 min- 64/39, 53 BPM   This nurse initiated the 500 ml's bolus  Current BP 131/52 mmHg Map 74. Rapid response and provider were both notified during the process. Patient was transferred back to bed and stated that she was feeling better.   Patient stated she has had events like this before and she knows to sit on the side of the bed and move her lower extremities before standing.    Hold BP medication if systolic < 140.    Good urine OP Alert and oriented  Rocky Cipro PA-C

## 2023-09-29 NOTE — Progress Notes (Addendum)
 OT Cancellation Note  Patient Details Name: [name] [name] MRN: [id number] DOB: 06/05/1945   Cancelled Treatment:    Reason Eval/Treat Not Completed: Other (comment) Patient with hypotensive event this AM with PT, with patient receiving bolus of IV fluids. OT attempting to complete evaluation and assess orthostatics, with RN deferring. OT will attempt at later date complete evaluation.   Mollie Anger E. Amiah Frohlich, OTR/L Acute Rehabilitation Services [phone]   Vincent Greek 09/29/2023, 2:45 PM

## 2023-09-29 NOTE — Progress Notes (Signed)
 At 9:46 am this nurse was notified by PT that she had moved the patient from bed to the chair. [name] [name] became nauseated, dizzy and diaphoretic with BP 58/39 mmHg, MAP 46. This nurse initiated the 500 ml's bolus standing order. BP were 66/39 (49), 73/40 (52), 88/44(58). Post bolus BP was 131/52 mmHg Map 74. Rapid response and provider were both notified during the process. Patient was transferred back to bed and stated that she was feeling better.

## 2023-09-29 NOTE — Evaluation (Signed)
 Physical Therapy Evaluation Patient Details Name: [name] [name] MRN: [id number] DOB: September 15, 1945 Today's Date: 09/29/2023  History of Present Illness  78 y.o. female presents to [facility] 09/28/23 for R LE endarterectomy, angioplasty, and thrombectomy with wound vac application. PMHx: chronic pain, T2DM, MI, COPD, tobacco use, HTN, OA  Clinical Impression  Pt in bed upon arrival and agreeable to PT eval. PTA, pt was ModI with rollator for short distances. In today's session, pt required CGA for bed mobility with increased time and effort. She was then able to stand with MinA and RW. Gait distance limited by fatigue/pain with pt able to perform step-pivot to recliner with CGA. After positioned in the recliner, pt reported feeling nauseous and lightheaded with RN notified and in room at end of session, vitals below. Pt currently lives alone with family working full time. Pt would prefer to go home and is going to reach out to family to see if more assist is available. Currently, pt would benefit from <3hrs post acute rehab. If pt is able to have more assistance at home, would recommend d/c home with OP PT. Pt currently with functional limitations due to the deficits listed below (see PT Problem List). Pt would benefit from acute skilled PT to address functional impairments.  Acute PT to follow.  BP in Supine- 141/53 BP seated in recliner- 54/36, 53 BPM BP seated in recliner after 3 min- 64/39, 53 BPM        If plan is discharge home, recommend the following: A little help with walking and/or transfers;A little help with bathing/dressing/bathroom;Assistance with cooking/housework;Assist for transportation;Help with stairs or ramp for entrance   Can travel by private vehicle    Yes    Equipment Recommendations BSC/3in1     Functional Status Assessment Patient has had a recent decline in their functional status and demonstrates the ability to make significant improvements in function in a reasonable and  predictable amount of time.     Precautions / Restrictions Precautions Precautions: Fall Precaution/Restrictions Comments: Low BP, R groin wound vac Restrictions Weight Bearing Restrictions Per Provider Order: No      Mobility  Bed Mobility Overal bed mobility: Needs Assistance Bed Mobility: Supine to Sit    Supine to sit: HOB elevated, Contact guard    General bed mobility comments: CGA for safety with increased time and effort. Cues to shift hips forward    Transfers Overall transfer level: Needs assistance Equipment used: Rolling walker (2 wheels) Transfers: Sit to/from Stand, Bed to chair/wheelchair/BSC Sit to Stand: Min assist   Step pivot transfers: Min assist     General transfer comment: MinA for boost-up and steadying assist. Cues for hand placement with RW.    Ambulation/Gait Ambulation/Gait assistance: Contact guard assist Gait Distance (Feet): 4 Feet Assistive device: Rolling walker (2 wheels) Gait Pattern/deviations: Step-through pattern, Decreased stride length, Shuffle Gait velocity: decr    General Gait Details: significantly decreased gait speed with very shortened step length. CGA for stability    Balance Overall balance assessment: Needs assistance, Mild deficits observed, not formally tested Sitting-balance support: No upper extremity supported, Feet supported Sitting balance-Leahy Scale: Fair     Standing balance support: Bilateral upper extremity supported, During functional activity, Reliant on assistive device for balance Standing balance-Leahy Scale: Poor Standing balance comment: reliant on RW       Pertinent Vitals/Pain Pain Assessment Pain Assessment: Faces Faces Pain Scale: Hurts little more Pain Location: R foot Pain Descriptors / Indicators: Aching, Discomfort Pain Intervention(s): Limited activity  within patient's tolerance, Monitored during session, Repositioned, Premedicated before session    Home Living Family/patient  expects to be discharged to:: Private residence Living Arrangements: Alone Available Help at Discharge: Family;Available PRN/intermittently;Neighbor Type of Home: Apartment Home Access: Level entry    Home Layout: One level Home Equipment: Rollator (4 wheels);Grab bars - toilet;Grab bars - tub/shower      Prior Function Prior Level of Function : Independent/Modified Independent    Mobility Comments: ModI with rollator ADLs Comments: Ind with ADLs and iADLs, grandkids will do most of the cooking. Has transportation through insurance or grandaughter drives     Extremity/Trunk Assessment   Upper Extremity Assessment Upper Extremity Assessment: Defer to OT evaluation    Lower Extremity Assessment Lower Extremity Assessment: Generalized weakness (WFL ROM, alert to light touch)    Cervical / Trunk Assessment Cervical / Trunk Assessment: Normal  Communication   Communication Communication: No apparent difficulties    Cognition Arousal: Alert Behavior During Therapy: WFL for tasks assessed/performed   PT - Cognitive impairments: No apparent impairments    Following commands: Intact       Cueing Cueing Techniques: Verbal cues        Exercises General Exercises - Lower Extremity Ankle Circles/Pumps: AROM, Both, 10 reps, Supine, Seated Long Arc Quad: AROM, Both, 10 reps, Seated   Assessment/Plan    PT Assessment Patient needs continued PT services  PT Problem List Decreased strength;Decreased activity tolerance;Decreased balance;Decreased mobility       PT Treatment Interventions DME instruction;Gait training;Functional mobility training;Therapeutic activities;Therapeutic exercise;Balance training;Neuromuscular re-education;Patient/family education    PT Goals (Current goals can be found in the Care Plan section)  Acute Rehab PT Goals Patient Stated Goal: to get stronger PT Goal Formulation: With patient Time For Goal Achievement: 10/13/23 Potential to Achieve  Goals: Good    Frequency Min 2X/week        AM-PAC PT "6 Clicks" Mobility  Outcome Measure Help needed turning from your back to your side while in a flat bed without using bedrails?: A Little Help needed moving from lying on your back to sitting on the side of a flat bed without using bedrails?: A Little Help needed moving to and from a bed to a chair (including a wheelchair)?: A Little Help needed standing up from a chair using your arms (e.g., wheelchair or bedside chair)?: A Little Help needed to walk in hospital room?: A Lot Help needed climbing 3-5 steps with a railing? : A Lot 6 Click Score: 16    End of Session Equipment Utilized During Treatment: Gait belt Activity Tolerance: Patient tolerated treatment well Patient left: in chair;with call bell/phone within reach;with nursing/sitter in room Nurse Communication: Mobility status;Other (comment) (low BP and symptoms) PT Visit Diagnosis: Unsteadiness on feet (R26.81);Muscle weakness (generalized) (M62.81);Other abnormalities of gait and mobility (R26.89)    Time: 9562-1308 PT Time Calculation (min) (ACUTE ONLY): 28 min   Charges:   PT Evaluation $PT Eval Low Complexity: 1 Low PT Treatments $Therapeutic Exercise: 8-22 mins PT General Charges $$ ACUTE PT VISIT: 1 Visit       Orysia Blas, PT, DPT Secure Chat Preferred  Rehab Office [phone]   Alissa April Adela Ades 09/29/2023, 10:27 AM

## 2023-09-29 NOTE — Plan of Care (Signed)

## 2023-09-29 NOTE — Progress Notes (Signed)
 PHARMACIST LIPID MONITORING  [name] [name] is a 78 y.o. female admitted on 09/28/2023 with extensive iliofemoral endarterectomy with vein patch angioplasty.  Pharmacy has been consulted to optimize lipid-lowering therapy with the indication of secondary prevention for clinical ASCVD.  Recent Labs:  Lipid Panel (last 6 months):   Lab Results  Component Value Date   CHOL 96 09/29/2023   TRIG 32 09/29/2023   HDL 57 09/29/2023   CHOLHDL 1.7 09/29/2023   VLDL 6 09/29/2023   LDLCALC 33 09/29/2023    Hepatic function panel (last 6 months):   Lab Results  Component Value Date   AST 16 09/28/2023   ALT 12 09/28/2023   ALKPHOS 56 09/28/2023   BILITOT 1.1 09/28/2023    SCr (since admission):   Serum creatinine: 0.65 mg/dL 08/65/78 4696 Estimated creatinine clearance: 46.6 mL/min  Current therapy and lipid therapy tolerance Current lipid-lowering therapy: rosuvastatin  10mg  daily Previous lipid-lowering therapies (if applicable): lovastatin, atorvastatin Documented or reported allergies or intolerances to lipid-lowering therapies (if applicable): N/A  Assessment:   LDL 33, at goal of < 55. However, not on high-intensity statin. Will increase rosuvastatin  dose to 20 mg daily. Patient agrees with changes to lipid-lowering therapy  Plan:    1.Statin intensity (high intensity recommended for all patients regardless of the LDL):  Add or increase statin to high intensity.  2.Add ezetimibe (if any one of the following):   Not indicated at this time.  3.Refer to lipid clinic:   No  4.Follow-up with:  Primary care provider - Sirivol, Mamatha, MD  5.Follow-up labs after discharge:  Changes in lipid therapy were made. Check a lipid panel in 8-12 weeks then annually.     [name] [name], PharmD 09/29/2023, 7:10 AM

## 2023-09-29 NOTE — Progress Notes (Addendum)
 [facility] [place]  Subjective  - Still has some right foot pain and numbness.   Objective (!) 127/48 60 98.3 F (36.8 C) (Oral) 19 98%  Intake/Output Summary (Last 24 hours) at 09/29/2023 0729 Last data filed at 09/29/2023 4098 Gross per 24 hour  Intake 1395.94 ml  Output 1930 ml  Net -534.06 ml    Right groin soft without hematoma, minimal ecchymosis Incisional vac with good suction Gross motor of toes and ankle right LE intact Doppler signal intact brisk PT >  AT  Lungs non labored breathing  Assessment/Planning: POD # 1 Procedure:   #1:  right ilio-femoral endarterectomy with vein patch angioplsty                       #2:  Right superficial femoral artery thrombectomy                       #3:  right great saphenous vein harvest                       #4:  Provena wound vac, right groin  Right LE with improved inflow and brisk PT > AT Incision vac for 7-10 days, no hematoma Encourage mobility  Rocky Cipro 09/29/2023 7:29 AM --  Laboratory Lab Results: Recent Labs    09/28/23 1229 09/29/23 0449  WBC 5.9 6.4  HGB 10.2* 9.4*  HCT 28.9* 27.7*  PLT 131* 132*   BMET Recent Labs    09/28/23 0550 09/28/23 1229 09/29/23 0449  NA 133*  --  134*  K 4.1  --  3.4*  CL 97*  --  98  CO2 29  --  28  GLUCOSE 89  --  93  BUN 6*  --  6*  CREATININE 0.70 0.59 0.65  CALCIUM  9.2  --  8.5*    COAG Lab Results  Component Value Date   INR 1.0 09/28/2023   INR 1.02 01/28/2015   No results found for: "PTT"   VASCULAR STAFF ADDENDUM: I have independently interviewed and examined the patient. I agree with the above.  Encouraged ambulation, possible discharge tomorrow  Philipp Brawn MD [facility] Phone Number: [phone] 09/29/2023 8:59 AM

## 2023-09-29 NOTE — Significant Event (Signed)
 Rapid Response Event Note   Reason for Call :  Hypotension 66/39  Initial Focused Assessment:  Per staff patient had recent been assisted up to the chair.  She became dizzy and diaphoretic per RN.  BP 66/39.   NS fluid bolus started prior to my arrival  BP 88/44  HR 58 She is alert and oriented.  Patient is sitting in the chair with her feet up. She is feeling a little better   Interventions:  Assisted back to bed. BP 131/52  HR 69. 500cc NS completed Patient denies dizziness or any other symptoms.    Plan of Care:  RN to call if patient becomes hypotensive again   Event Summary:   MD Notified:  Hazeline Lister PA Call Time: [phone] Arrival Time: 1000 End Time: 1020  Waldemar Guillaume, RN

## 2023-09-29 NOTE — Plan of Care (Signed)
  Problem: Education: Goal: Knowledge of General Education information will improve Description: Including pain rating scale, medication(s)/side effects and non-pharmacologic comfort measures Outcome: Progressing   Problem: Clinical Measurements: Goal: Will remain free from infection Outcome: Progressing Goal: Diagnostic test results will improve Outcome: Progressing Goal: Cardiovascular complication will be avoided Outcome: Progressing   Problem: Coping: Goal: Level of anxiety will decrease Outcome: Progressing   

## 2023-09-30 LAB — BASIC METABOLIC PANEL WITH GFR
Anion gap: 6 (ref 5–15)
BUN: 7 mg/dL — ABNORMAL LOW (ref 8–23)
CO2: 28 mmol/L (ref 22–32)
Calcium: 8.5 mg/dL — ABNORMAL LOW (ref 8.9–10.3)
Chloride: 102 mmol/L (ref 98–111)
Creatinine, Ser: 0.58 mg/dL (ref 0.44–1.00)
GFR, Estimated: >60 mL/min (ref >60)
Glucose, Bld: 94 mg/dL (ref 70–99)
Potassium: 3.7 mmol/L (ref 3.5–5.1)
Sodium: 136 mmol/L (ref 135–145)

## 2023-09-30 LAB — CBC
HCT: 27.2 % — ABNORMAL LOW (ref 36.0–46.0)
Hemoglobin: 9.3 g/dL — ABNORMAL LOW (ref 12.0–15.0)
MCH: 33 pg (ref 26.0–34.0)
MCHC: 34.2 g/dL (ref 30.0–36.0)
MCV: 96.5 fL (ref 80.0–100.0)
Platelets: 122 10*3/uL — ABNORMAL LOW (ref 150–400)
RBC: 2.82 MIL/uL — ABNORMAL LOW (ref 3.87–5.11)
RDW: 13.7 % (ref 11.5–15.5)
WBC: 5.3 10*3/uL (ref 4.0–10.5)
nRBC: 0 % (ref 0.0–0.2)

## 2023-09-30 NOTE — Progress Notes (Addendum)
 [facility]  Subjective  - feels fine, no hypotensive events over night   Objective 129/72 66 98.1 F (36.7 C) 18 95%  Intake/Output Summary (Last 24 hours) at 09/30/2023 0726 Last data filed at 09/29/2023 2200 Gross per 24 hour  Intake 200 ml  Output 1150 ml  Net -950 ml   Right groin soft without hematoma, minimal ecchymosis Incisional vac with good suction Gross motor of toes and ankle right LE intact Doppler signal intact brisk PT >  AT , palpable AT Lungs non labored breathing   Assessment/Planning: POD #  Procedure:   #1:  right ilio-femoral endarterectomy with vein patch angioplsty                       #2:  Right superficial femoral artery thrombectomy                       #3:  right great saphenous vein harvest                       #4:  Provena wound vac, right groin  BP stable 129/72 she received Clonidine  and Imdur  on 09/29/23.  Orthostatic hypotensive event 09/29/23.  She states this has happened before and she will take her time getting up to sit then standing with assistance.     IS to bedside 10 times q hr while awake Right LE with improved inflow and brisk PT > AT Incision vac for 7-10 days, no hematoma Encourage mobility Will get new labs in am Possible discharge tomorrow pending ambulation and BP  Cont to hole BP medication if systolic <140 care ordered placed and I spoke with the RN.      Rocky Cipro 09/30/2023 7:26 AM --  Laboratory Lab Results: Recent Labs    09/28/23 1229 09/29/23 0449  WBC 5.9 6.4  HGB 10.2* 9.4*  HCT 28.9* 27.7*  PLT 131* 132*   BMET Recent Labs    09/28/23 0550 09/28/23 1229 09/29/23 0449  NA 133*  --  134*  K 4.1  --  3.4*  CL 97*  --  98  CO2 29  --  28  GLUCOSE 89  --  93  BUN 6*  --  6*  CREATININE 0.70 0.59 0.65  CALCIUM  9.2  --  8.5*    COAG Lab Results  Component Value Date   INR 1.0 09/28/2023   INR 1.02 01/28/2015   No results found for:  "PTT"  VASCULAR STAFF ADDENDUM: I have independently interviewed and examined the patient. I agree with the above.   Philipp Brawn MD [facility] Phone Number: [phone] 09/30/2023 9:36 AM

## 2023-09-30 NOTE — NC FL2 (Signed)
 [place]  MEDICAID [id number] LEVEL OF CARE FORM     IDENTIFICATION  Patient Name: [name] [name] Birthdate: [birth year] Sex: female Admission Date (Current Location): 09/28/2023  [facility] and [place] Number:  Producer, television/film/video and Address:  [facility], [address], [place], [place] [address]      Provider Number: [id number]  Attending Physician Name and Address:  Margherita Shell, MD  Relative Name and Phone Number:  Vonnie Gubler (Granddaughter)  [phone]    Current Level of Care: Hospital Recommended Level of Care: Skilled Nursing Facility Prior Approval Number:    Date Approved/Denied:   PASRR Number:    Discharge Plan: SNF    Current Diagnoses: Patient Active Problem List   Diagnosis Date Noted   Peripheral artery disease (HCC) 09/28/2023   PAD (peripheral artery disease) (HCC) 09/28/2023   Edema of left lower extremity 09/20/2023   Depression, major, single episode, severe (HCC) 08/14/2023   Type 2 diabetes mellitus with other circulatory complications (HCC) 08/14/2023   Acute cough 04/24/2023   Insomnia 04/24/2023   Vitamin D  deficiency 01/08/2023   Chronic neck pain 01/08/2023   Chronic pain syndrome 10/07/2022   Grief 10/03/2022   Mild vitamin D  deficiency 10/03/2022   Personal history of nicotine  dependence 10/03/2022   Neck pain 09/15/2022   Temporal headache 09/15/2022   Hair loss 09/15/2022   Lumbar radiculopathy 07/01/2022   Spinal stenosis of lumbar region 07/01/2022   Cigarette smoker 02/22/2022   Anxiety 02/21/2022   Aortic atherosclerosis (HCC) 02/21/2022   Ascending aorta dilation (HCC) 02/21/2022   Asthma 02/21/2022   CAD (coronary artery disease) 02/21/2022   History of kidney stones 02/21/2022   Hypertension associated with diabetes (HCC) 02/21/2022   Lupus 02/21/2022   Paraesophageal hernia 02/21/2022   Presbyesophagus 02/21/2022   PSVT (paroxysmal supraventricular tachycardia) (HCC) 02/21/2022   Schatzki's  ring 02/21/2022   BMI 24.0-24.9, adult 02/14/2022   Sciatica 01/31/2022   Renal calculus 12/21/2021   S/P repair of paraesophageal hernia 02/22/2021   Coronary artery disease involving native coronary artery of native heart without angina pectoris 11/18/2020   Cataract 11/16/2020   Osteoarthritis    Enlarged aorta (HCC)    Depression, major, recurrent, mild (HCC) 03/30/2020   PAC (premature atrial contraction) 02/03/2020   PAT (paroxysmal atrial tachycardia) (HCC) 02/03/2020   Diastolic dysfunction 09/11/2019   Tobacco use 08/19/2019   Hyperlipidemia 08/07/2019   Hypertensive heart disease 08/07/2019   COPD (chronic obstructive pulmonary disease) (HCC) 08/07/2019   GERD with esophagitis 08/07/2019   Degenerative joint disease involving multiple joints 09/28/2017   Long-term current use of opiate analgesic 06/25/2017   Pain of right hip joint 11/22/2015   Left arm pain 09/27/2015   S/P total knee arthroplasty 02/15/2015   Primary osteoarthritis of left knee 02/04/2015    Orientation RESPIRATION BLADDER Height & Weight     Time, Situation, Place, Self  Normal Continent Weight: 119 lb (54 kg) Height:  5\' 2"  (157.5 cm)  BEHAVIORAL SYMPTOMS/MOOD NEUROLOGICAL BOWEL NUTRITION STATUS      Continent Diet (see d/c summary)  AMBULATORY STATUS COMMUNICATION OF NEEDS Skin   Extensive Assist Verbally Surgical wounds (closed incision (R Groin))                       Personal Care Assistance Level of Assistance  Bathing, Feeding, Dressing Bathing Assistance: Limited assistance Feeding assistance: Limited assistance Dressing Assistance: Limited assistance     Functional Limitations Info  Sight, Hearing, Speech Sight Info: Adequate Hearing Info: Adequate Speech Info: Adequate    SPECIAL CARE FACTORS FREQUENCY  PT (By licensed PT), OT (By licensed OT)     PT Frequency: 5x/ week OT Frequency: 5x/ week            Contractures Contractures Info: Not present     Additional Factors Info  Code Status, Allergies, Psychotropic Code Status Info: FULL Allergies Info: Meperidine  Meperidine Hcl  Gabapentin   Influenza Vaccines  Other Psychotropic Info: Buproprion (150mg ), Clonidine  ( .1mg ), Trazodone  (100mg )         Current Medications (09/30/2023):  This is the current hospital active medication list Current Facility-Administered Medications  Medication Dose Route Frequency Provider Last Rate Last Admin   0.9 %  sodium chloride  infusion  500 mL Intravenous Once PRN Arnaldo Betters M, PA-C       acetaminophen  (TYLENOL ) tablet 325-650 mg  325-650 mg Oral Q4H PRN Cordie Deters, PA-C       Or   acetaminophen  (TYLENOL ) suppository 325-650 mg  325-650 mg Rectal Q4H PRN Cordie Deters, PA-C       albuterol  (PROVENTIL ) (2.5 MG/3ML) 0.083% nebulizer solution 2.5 mg  2.5 mg Nebulization Q6H PRN Cordie Deters, PA-C       alum & mag hydroxide-simeth (MAALOX/MYLANTA) 200-200-20 MG/5ML suspension 15-30 mL  15-30 mL Oral Q2H PRN Cordie Deters, PA-C       aspirin  EC tablet 81 mg  81 mg Oral Daily Eveland, Matthew, PA-C   81 mg at 09/30/23 7829   buPROPion  (WELLBUTRIN  XL) 24 hr tablet 150 mg  150 mg Oral q morning Cordie Deters, PA-C   150 mg at 09/30/23 5621   cloNIDine  (CATAPRES ) tablet 0.1 mg  0.1 mg Oral BID Cordie Deters, PA-C   0.1 mg at 09/30/23 3086   docusate sodium  (COLACE) capsule 100 mg  100 mg Oral Daily Cordie Deters, PA-C   100 mg at 09/29/23 1024   guaiFENesin-dextromethorphan (ROBITUSSIN DM) 100-10 MG/5ML syrup 15 mL  15 mL Oral Q4H PRN Cordie Deters, PA-C       heparin  injection 5,000 Units  5,000 Units Subcutaneous Q8H Eveland, Matthew, PA-C   5,000 Units at 09/30/23 1534   hydrALAZINE  (APRESOLINE ) injection 5 mg  5 mg Intravenous Q20 Min PRN Cordie Deters, PA-C       isosorbide  mononitrate (IMDUR ) 24 hr tablet 30 mg  30 mg Oral Daily Eveland, Matthew, PA-C   30 mg at 09/30/23 5784   labetalol  (NORMODYNE ) injection 10 mg  10  mg Intravenous Q10 min PRN Cordie Deters, PA-C       lisinopril  (ZESTRIL ) tablet 40 mg  40 mg Oral Daily Margherita Shell, MD   40 mg at 09/30/23 6962   magnesium sulfate IVPB 2 g 50 mL  2 g Intravenous Daily PRN Cordie Deters, PA-C       metoprolol  tartrate (LOPRESSOR ) injection 2-5 mg  2-5 mg Intravenous Q2H PRN Cordie Deters, PA-C       morphine  (PF) 4 MG/ML injection 4 mg  4 mg Intravenous Q2H PRN Cordie Deters, PA-C       ondansetron  (ZOFRAN ) injection 4 mg  4 mg Intravenous Q6H PRN Cordie Deters, PA-C       oxyCODONE -acetaminophen  (PERCOCET/ROXICET) 5-325 MG per tablet 1-2 tablet  1-2 tablet Oral Q4H PRN Cordie Deters, PA-C   2 tablet at 09/30/23 1538   pantoprazole  (PROTONIX ) EC tablet 40 mg  40 mg Oral Daily Eveland, Matthew, PA-C   40 mg  at 09/30/23 0916   phenol (CHLORASEPTIC) mouth spray 1 spray  1 spray Mouth/Throat PRN Cordie Deters, PA-C       potassium chloride  SA (KLOR-CON  M) CR tablet 20-40 mEq  20-40 mEq Oral Daily PRN Eveland, Matthew, PA-C       rosuvastatin  (CRESTOR ) tablet 20 mg  20 mg Oral QHS Robles, Jessica H, RPH   20 mg at 09/29/23 2127   traZODone  (DESYREL ) tablet 100 mg  100 mg Oral QHS Cordie Deters, PA-C   100 mg at 09/29/23 2126     Discharge Medications: Please see discharge summary for a list of discharge medications.  Relevant Imaging Results:  Relevant Lab Results:   Additional Information SSN: [id number]  Monnie Anthony Camila [place], LCSW

## 2023-09-30 NOTE — Evaluation (Signed)
 Occupational Therapy Evaluation Patient Details Name: [name] [name] MRN: [id number] DOB: Mar 04, 1946 Today's Date: 09/30/2023   History of Present Illness   78 y.o. female presents to [facility] 09/28/23 for R LE endarterectomy, angioplasty, and thrombectomy with wound vac application. PMHx: chronic pain, T2DM, MI, COPD, tobacco use, HTN, OA     Clinical Impressions Prior to this admission, patient living alone but friends and family close by. Patient's granddaughter assists on the weekend with groceries as patient no longer drives. Patient walks with a rollator, and is typcial independent with ADLs. Currently, patient with lower BP but patient is aware and has dealt with low BP before and is able to transition to EOB and into standing with increased time to allow her body to acclimate. Patient min A for ADL managment, and able to complete stand pivot to recliner at min A. Encouraged patient to use BSC to allow patient's body to get used to more upright movement with patient in agreement. Given progress, OT is recommending HHOT at discharge, however if patient is unable to have more help at discharge, or her BP is unable to remain stable, she may benefit from short stint at lesser intensity rehab < 3 hours.   BP in supine: 97/50 (64) BP in supine after leg pumps and exercises 115/49 (70) BP seated EOB after exercises: 107/57 (73) BP after completing sit<>stand to recliner 122/59 (78)     If plan is discharge home, recommend the following:   A little help with walking and/or transfers;A little help with bathing/dressing/bathroom;Assist for transportation;Assistance with cooking/housework     Functional Status Assessment   Patient has had a recent decline in their functional status and demonstrates the ability to make significant improvements in function in a reasonable and predictable amount of time.     Equipment Recommendations   None recommended by OT     Recommendations for Other  Services         Precautions/Restrictions   Precautions Precautions: Fall Precaution/Restrictions Comments: Low BP, R groin wound vac Restrictions Weight Bearing Restrictions Per Provider Order: No     Mobility Bed Mobility Overal bed mobility: Needs Assistance Bed Mobility: Supine to Sit     Supine to sit: HOB elevated, Contact guard     General bed mobility comments: CGA for safety, sitting EOB for a number of minutes pumping legs for no drop in BP    Transfers Overall transfer level: Needs assistance Equipment used: Rolling walker (2 wheels) Transfers: Sit to/from Stand, Bed to chair/wheelchair/BSC Sit to Stand: Min assist     Step pivot transfers: Min assist     General transfer comment: Min A for safety, able to complete without physical assist, but required increased assist for line management      Balance Overall balance assessment: Needs assistance Sitting-balance support: No upper extremity supported, Feet supported Sitting balance-Leahy Scale: Fair     Standing balance support: Bilateral upper extremity supported, During functional activity, Reliant on assistive device for balance Standing balance-Leahy Scale: Poor Standing balance comment: reliant on RW                           ADL either performed or assessed with clinical judgement   ADL Overall ADL's : Needs assistance/impaired Eating/Feeding: Set up;Sitting   Grooming: Set up;Sitting   Upper Body Bathing: Set up;Sitting   Lower Body Bathing: Minimal assistance;Sit to/from stand;Sitting/lateral leans   Upper Body Dressing : Set up;Sitting   Lower  Body Dressing: Minimal assistance;Sit to/from stand;Sitting/lateral leans   Toilet Transfer: Minimal assistance Toilet Transfer Details (indicate cue type and reason): simulated with transfer to recliner Toileting- Clothing Manipulation and Hygiene: Minimal assistance;Sitting/lateral lean;Sit to/from stand       Functional  mobility during ADLs: Minimal assistance;Rolling walker (2 wheels) General ADL Comments: Prior to this admission, patient living alone but friends and family close by. Patient's granddaughter assists on the weekend with groceries as patient no longer drives. Patient walks with a rollator, and is typcial independent with ADLs. Currently, patient with lower BP but patient is aware and has dealt with low BP before and is able to transition to EOB and into standing with increased time to allow her body to acclimate. Patient min A for ADL managment, and able to complete stand pivot to recliner at min A. Encouraged patient to use BSC to allow patient's body to get used to more upright movement with patient in agreement. Given progress, OT is recommending HHOT at discharge, however if patient is unable to have more help at discharge, or her BP is unable to remain stable, she may benefit from short stint at lesser intensity rehab < 3 hours.     Vision Baseline Vision/History: 1 Wears glasses Ability to See in Adequate Light: 0 Adequate Patient Visual Report: No change from baseline Vision Assessment?: No apparent visual deficits     Perception Perception: Not tested       Praxis Praxis: Not tested       Pertinent Vitals/Pain Pain Assessment Pain Assessment: Faces Faces Pain Scale: Hurts a little bit Pain Location: R foot Pain Descriptors / Indicators: Aching, Discomfort Pain Intervention(s): Limited activity within patient's tolerance, Monitored during session, Repositioned     Extremity/Trunk Assessment Upper Extremity Assessment Upper Extremity Assessment: Generalized weakness;Right hand dominant   Lower Extremity Assessment Lower Extremity Assessment: Defer to PT evaluation   Cervical / Trunk Assessment Cervical / Trunk Assessment: Normal   Communication Communication Communication: No apparent difficulties   Cognition Arousal: Alert Behavior During Therapy: WFL for tasks  assessed/performed Cognition: No apparent impairments                               Following commands: Intact       Cueing  General Comments   Cueing Techniques: Verbal cues      Exercises     Shoulder Instructions      Home Living Family/patient expects to be discharged to:: Private residence Living Arrangements: Alone Available Help at Discharge: Family;Available PRN/intermittently;Neighbor Type of Home: Apartment Home Access: Level entry     Home Layout: One level     Bathroom Shower/Tub: Chief Strategy Officer: Standard Bathroom Accessibility: Yes   Home Equipment: Rollator (4 wheels);Grab bars - toilet;Grab bars - tub/shower          Prior Functioning/Environment Prior Level of Function : Independent/Modified Independent             Mobility Comments: ModI with rollator ADLs Comments: Ind with ADLs and iADLs, grandkids will do most of the cooking. Has transportation through insurance or grandaughter drives    OT Problem List: Decreased strength;Decreased activity tolerance;Impaired balance (sitting and/or standing);Decreased coordination;Pain;Cardiopulmonary status limiting activity   OT Treatment/Interventions: Self-care/ADL training;Therapeutic exercise;Energy conservation;DME and/or AE instruction;Manual therapy;Patient/family education;Balance training      OT Goals(Current goals can be found in the care plan section)   Acute Rehab OT Goals Patient Stated  Goal: to go home OT Goal Formulation: With patient Time For Goal Achievement: 10/14/23 Potential to Achieve Goals: Good   OT Frequency:  Min 2X/week    Co-evaluation              AM-PAC OT "6 Clicks" Daily Activity     Outcome Measure Help from another person eating meals?: A Little Help from another person taking care of personal grooming?: A Little Help from another person toileting, which includes using toliet, bedpan, or urinal?: A Little Help from  another person bathing (including washing, rinsing, drying)?: A Little Help from another person to put on and taking off regular upper body clothing?: A Little Help from another person to put on and taking off regular lower body clothing?: A Little 6 Click Score: 18   End of Session Equipment Utilized During Treatment: Gait belt;Rolling walker (2 wheels) Nurse Communication: Mobility status  Activity Tolerance: Patient tolerated treatment well Patient left: in chair;with call bell/phone within reach;with chair alarm set  OT Visit Diagnosis: Unsteadiness on feet (R26.81);Other abnormalities of gait and mobility (R26.89);Muscle weakness (generalized) (M62.81);Pain Pain - Right/Left: Right Pain - part of body: Ankle and joints of foot                Time: 1343-1413 OT Time Calculation (min): 30 min Charges:  OT General Charges $OT Visit: 1 Visit OT Evaluation $OT Eval Moderate Complexity: 1 Mod OT Treatments $Self Care/Home Management : 8-22 mins  Mollie Anger E. Jordyn Hofacker, OTR/L Acute Rehabilitation Services [phone]   Vincent Greek 09/30/2023, 3:26 PM

## 2023-09-30 NOTE — TOC Initial Note (Signed)
 Transition of Care [facility]) - Initial/Assessment Note    Patient Details  Name: [name] [name] MRN: [id number] Date of Birth: December 30, 1945  Transition of Care [facility]) CM/SW Contact:    Maya Sparrow, LCSW Phone Number: 09/30/2023, 4:07 PM  Clinical Narrative:                 CSW received consult for possible SNF placement at time of discharge. CSW spoke with patient. Patient expressed understanding of PT recommendation and is undecided on whether she wants to discharge home or to SNF but is agreeable to SNF workup to know which facilities could accept her if SNF is her final decision.  Patient reports preference for a facility in [place] or [place]. CSW discussed insurance authorization process and will provide Medicare SNF ratings list. CSW will send out referrals for review and provide bed offers as available.    Skilled Nursing Rehab Facilities-   [email or web address]   Ratings out of 5 stars (5 the highest)   Name Address  Phone # Quality Care Staffing Health Inspection Overall  [facility] [address], [place] [phone] 3 3 4 4   [facility] [address], [place] [phone] 5 1 4 4   [facility] [address], [facility] [phone] 2 2 2 2   [facility] [address], [place] [phone] 5 2 4 5   [facility]  [address], [place] [phone] 4 3 5 5   [facility] [address] [facility] [phone] 4 2 2 2   [facility] [address], [place] [phone] 5 1 2 2   [facility] [address] [place] [phone] 2 1 3 2   [address] ([place]) [address] [place] [phone] 2 3 3 3   [facility] [address] [place], [place] [phone] 3 3 2 2   [facility] ([place]) [address], [place] [phone] 3 1 1 1   [facility] [address] [place] [phone] 2 3 4 4   [facility] [address], [place] [phone] 4 4 3 3   [facility] ([place]) [address], [place] [phone] 1 2 4 3           [facility] [address] [place] [phone] 2 1 4 3   [facility] [address], [place] [phone] 4 2 1 1   [facility]  [address] [place], [place] [address] [phone] 2 2 2 2   [facility] [address] [phone] 3 2 4 4   [facility] [address] [place] [phone] 2 1 2 1   [facility] (No [facility]) [address], [place] [phone] 5 4 5 5   [facility] [address] [facility] [phone] 3 4 2 2   [facility] [address] [place] [phone] 2 1 1 1   [place] [address] [place] [phone] 4 2 5 5   [facility] [address] [place] [phone] 4 1 1 1   [facility] [address] [place], [place] [phone] 2 2 3 3           [facility]  [phone] 3 1 1 1   [facility] [address] [place] Alpha  [phone] 3 3 3 3   [facility] (No Humana) [address] [place] [phone] 2 2 4 4   [facility] [address], [place] [phone] 2 1 1 1   [facility] [address], [place] [phone] 4 3 5 5   [facility]  [place] [address], [place] [phone] 1 1 1 1           [facility] [address] [place], [place] [phone] 5 4 5 5   [facility])  [address], [place] [phone] 1 1 2 1   [facility] [address] [place] [phone]  2 4 4   [facility] [place] [address] [place] [phone] 3 5 5 5   [address] [place], [place] [phone] 4 2 2 2   [facility] [place]) [address] [place] [phone] 2 1 3 2     Expected Discharge Plan: Skilled Nursing Facility Barriers to Discharge: Insurance Authorization, Continued Medical Work up, SNF Pending bed  offer   Patient Goals and CMS Choice Patient states their goals for this hospitalization and ongoing recovery are:: To return home when possible          Expected Discharge Plan and Services In-house Referral: Clinical Social Work     Living arrangements for the past 2 months: Apartment                                      Prior Living Arrangements/Services Living arrangements for the past 2 months: Apartment Lives with:: Self Patient language and need for interpreter reviewed:: Yes Do you feel safe going back to the place where you live?: Yes      Need for Family Participation in Patient Care: Yes (Comment) Care giver support system in place?: Yes (comment)   Criminal Activity/Legal Involvement Pertinent to Current Situation/Hospitalization: No - Comment as needed  Activities of Daily Living   ADL Screening (condition at time of admission) Independently performs ADLs?: Yes (appropriate for developmental age) Is the patient deaf or have difficulty hearing?: No Does the patient have difficulty seeing, even when wearing glasses/contacts?: No Does the patient have difficulty concentrating, remembering, or making decisions?: No  Permission Sought/Granted   Permission granted to share information with : Yes, Verbal Permission Granted     Permission granted to share info w AGENCY: SNFs        Emotional Assessment   Attitude/Demeanor/Rapport: Engaged   Orientation: : Oriented to  Time, Oriented to Place, Oriented to Self Alcohol / Substance Use: Not Applicable Psych Involvement: No (comment)  Admission diagnosis:  Atherosclerosis of native artery of right lower extremity with rest pain (HCC) [I70.221] Peripheral artery disease (HCC) [I73.9] PAD (peripheral artery disease) (HCC) [I73.9] Patient Active Problem List   Diagnosis Date Noted   Peripheral artery disease (HCC) 09/28/2023   PAD (peripheral artery disease) (HCC) 09/28/2023   Edema of left lower  extremity 09/20/2023   Depression, major, single episode, severe (HCC) 08/14/2023   Type 2 diabetes mellitus with other circulatory complications (HCC) 08/14/2023   Acute cough 04/24/2023   Insomnia 04/24/2023   Vitamin D  deficiency 01/08/2023   Chronic neck pain 01/08/2023   Chronic pain syndrome 10/07/2022   Grief 10/03/2022   Mild vitamin D  deficiency 10/03/2022   Personal history of nicotine  dependence 10/03/2022   Neck pain 09/15/2022   Temporal headache 09/15/2022   Hair loss 09/15/2022   Lumbar radiculopathy 07/01/2022   Spinal stenosis of lumbar region 07/01/2022   Cigarette smoker 02/22/2022   Anxiety 02/21/2022   Aortic atherosclerosis (HCC) 02/21/2022   Ascending aorta dilation (HCC) 02/21/2022   Asthma 02/21/2022   CAD (coronary artery disease) 02/21/2022   History of  kidney stones 02/21/2022   Hypertension associated with diabetes (HCC) 02/21/2022   Lupus 02/21/2022   Paraesophageal hernia 02/21/2022   Presbyesophagus 02/21/2022   PSVT (paroxysmal supraventricular tachycardia) (HCC) 02/21/2022   Schatzki's ring 02/21/2022   BMI 24.0-24.9, adult 02/14/2022   Sciatica 01/31/2022   Renal calculus 12/21/2021   S/P repair of paraesophageal hernia 02/22/2021   Coronary artery disease involving native coronary artery of native heart without angina pectoris 11/18/2020   Cataract 11/16/2020   Osteoarthritis    Enlarged aorta (HCC)    Depression, major, recurrent, mild (HCC) 03/30/2020   PAC (premature atrial contraction) 02/03/2020   PAT (paroxysmal atrial tachycardia) (HCC) 02/03/2020   Diastolic dysfunction 09/11/2019   Tobacco use 08/19/2019   Hyperlipidemia 08/07/2019   Hypertensive heart disease 08/07/2019   COPD (chronic obstructive pulmonary disease) (HCC) 08/07/2019   GERD with esophagitis 08/07/2019   Degenerative joint disease involving multiple joints 09/28/2017   Long-term current use of opiate analgesic 06/25/2017   Pain of right hip joint 11/22/2015    Left arm pain 09/27/2015   S/P total knee arthroplasty 02/15/2015   Primary osteoarthritis of left knee 02/04/2015   PCP:  Sirivol, Mamatha, MD Pharmacy:   [facility] [phone] - [place], [place] - [address] [place] [address] Phone: [phone] Fax: [phone]  [facility] [place], [place] - [address] [place] [address] Phone: [phone] Fax: [phone]     Social Drivers of Health (SDOH) Social History: SDOH Screenings   Food Insecurity: No Food Insecurity (09/28/2023)  Housing: Low Risk  (09/28/2023)  Transportation Needs: Unmet Transportation Needs (09/28/2023)  Utilities: Not At Risk (09/28/2023)  Alcohol Screen: Low Risk  (07/11/2023)  Depression (PHQ2-9): High Risk (08/14/2023)  Financial Resource Strain: Low Risk  (07/13/2023)  Physical Activity: Inactive (08/01/2022)  Social Connections: Socially Isolated (09/28/2023)  Stress: Stress Concern Present (08/01/2022)  Tobacco Use: High Risk (09/28/2023)  Health Literacy: Adequate Health Literacy (07/11/2023)   SDOH Interventions:     Readmission Risk Interventions     No data to display

## 2023-10-01 ENCOUNTER — Encounter (HOSPITAL_COMMUNITY): Payer: Self-pay | Admitting: Surgery

## 2023-10-01 LAB — CBC
HCT: 25.5 % — ABNORMAL LOW (ref 36.0–46.0)
Hemoglobin: 8.6 g/dL — ABNORMAL LOW (ref 12.0–15.0)
MCH: 32.6 pg (ref 26.0–34.0)
MCHC: 33.7 g/dL (ref 30.0–36.0)
MCV: 96.6 fL (ref 80.0–100.0)
Platelets: 130 10*3/uL — ABNORMAL LOW (ref 150–400)
RBC: 2.64 MIL/uL — ABNORMAL LOW (ref 3.87–5.11)
RDW: 13.8 % (ref 11.5–15.5)
WBC: 5 10*3/uL (ref 4.0–10.5)
nRBC: 0 % (ref 0.0–0.2)

## 2023-10-01 LAB — BASIC METABOLIC PANEL WITH GFR
Anion gap: 4 — ABNORMAL LOW (ref 5–15)
BUN: 8 mg/dL (ref 8–23)
CO2: 29 mmol/L (ref 22–32)
Calcium: 8.4 mg/dL — ABNORMAL LOW (ref 8.9–10.3)
Chloride: 102 mmol/L (ref 98–111)
Creatinine, Ser: 0.57 mg/dL (ref 0.44–1.00)
GFR, Estimated: >60 mL/min (ref >60)
Glucose, Bld: 95 mg/dL (ref 70–99)
Potassium: 3.4 mmol/L — ABNORMAL LOW (ref 3.5–5.1)
Sodium: 135 mmol/L (ref 135–145)

## 2023-10-01 NOTE — Progress Notes (Signed)
  Progress Note    10/01/2023 6:44 AM 3 Days Post-Op  Subjective:  says she feels ok.  Says her lateral left ankle is sore.   Afebrile HR 50's-60's NSR 100's-120's systolic 96% RA  Vitals:   10/01/23 0000 10/01/23 0400  BP: 119/62 101/60  Pulse: (!) 55 (!) 55  Resp: 11 14  Temp: 98 F (36.7 C) 98.3 F (36.8 C)  SpO2: 98% 96%    Physical Exam: General:  no distress; resting comfortably Cardiac:  regular Lungs:  non labored Incisions:  prevena in place with good seal Extremities:  brisk right DP/PT/peroneal Abdomen:  soft  CBC    Component Value Date/Time   WBC 5.0 10/01/2023 0409   RBC 2.64 (L) 10/01/2023 0409   HGB 8.6 (L) 10/01/2023 0409   HGB 12.1 08/14/2023 1139   HCT 25.5 (L) 10/01/2023 0409   HCT 35.7 08/14/2023 1139   PLT 130 (L) 10/01/2023 0409   PLT 249 08/14/2023 1139   MCV 96.6 10/01/2023 0409   MCV 96 08/14/2023 1139   MCH 32.6 10/01/2023 0409   MCHC 33.7 10/01/2023 0409   RDW 13.8 10/01/2023 0409   RDW 11.9 08/14/2023 1139   LYMPHSABS 1.8 09/18/2023 1703   LYMPHSABS 2.1 08/14/2023 1139   MONOABS 0.5 09/18/2023 1703   EOSABS 0.1 09/18/2023 1703   EOSABS 0.1 08/14/2023 1139   BASOSABS 0.1 09/18/2023 1703   BASOSABS 0.1 08/14/2023 1139    BMET    Component Value Date/Time   NA 135 10/01/2023 0409   NA 138 08/14/2023 1139   K 3.4 (L) 10/01/2023 0409   CL 102 10/01/2023 0409   CO2 29 10/01/2023 0409   GLUCOSE 95 10/01/2023 0409   BUN 8 10/01/2023 0409   BUN 9 08/14/2023 1139   CREATININE 0.57 10/01/2023 0409   CALCIUM  8.4 (L) 10/01/2023 0409   GFRNONAA >60 10/01/2023 0409   GFRAA 100 07/14/2020 1442    INR    Component Value Date/Time   INR 1.0 09/28/2023 0550     Intake/Output Summary (Last 24 hours) at 10/01/2023 0644 Last data filed at 10/01/2023 0500 Gross per 24 hour  Intake --  Output 1150 ml  Net -1150 ml      Assessment/Plan:  78 y.o. female is s/p:  Right iliofemoral endarterectomy with GSV patch angioplasty, SFA  thrombectomy and Prevena placement 09/28/2023 by Dr. Charlotte Cookey  3 Days Post-Op   -brisk doppler signals right foot as well as left PT -soreness in left lateral ankle-no ulceration present and good doppler flow -DVT prophylaxis:  sq heparin  -no hypotensive events overnight -continue to ambulate   Maryanna Smart, PA-C [facility] [phone] 10/01/2023 6:44 AM

## 2023-10-01 NOTE — TOC Progression Note (Signed)
 Transition of Care [facility]) - Progression Note    Patient Details  Name: [name] [name] MRN: [id number] Date of Birth: [birth year]  [facility]) CM/SW Contact  Valery Gaucher, [place] Phone Number: 10/01/2023, 12:18 PM  Clinical Narrative:     Received message, patient requested SNF referral to be sent to [place] [facility]. Referral sent as requested.  Liddie Reel, MSW, LCSW Clinical Social Worker    Expected Discharge Plan: Skilled Nursing Facility Barriers to Discharge: Insurance Authorization, Continued Medical Work up, SNF Pending bed offer  Expected Discharge Plan and Services In-house Referral: Clinical Social Work     Living arrangements for the past 2 months: Apartment                                       Social Determinants of Health (SDOH) Interventions SDOH Screenings   Food Insecurity: No Food Insecurity (09/28/2023)  Housing: Low Risk  (09/28/2023)  Transportation Needs: Unmet Transportation Needs (09/28/2023)  Utilities: Not At Risk (09/28/2023)  Alcohol Screen: Low Risk  (07/11/2023)  Depression (PHQ2-9): High Risk (08/14/2023)  Financial Resource Strain: Low Risk  (07/13/2023)  Physical Activity: Inactive (08/01/2022)  Social Connections: Socially Isolated (09/28/2023)  Stress: Stress Concern Present (08/01/2022)  Tobacco Use: High Risk (09/28/2023)  Health Literacy: Adequate Health Literacy (07/11/2023)    Readmission Risk Interventions     No data to display

## 2023-10-01 NOTE — Progress Notes (Signed)
 Mobility Specialist Progress Note:    10/01/23 4010  Mobility  Activity Transferred from bed to chair  Level of Assistance Minimal assist, patient does 75% or more  Assistive Device Front wheel walker  Distance Ambulated (ft) 5 ft  Activity Response Tolerated well  Mobility Referral Yes  Mobility visit 1 Mobility  Mobility Specialist Start Time (ACUTE ONLY) [id number]  Mobility Specialist Stop Time (ACUTE ONLY) [id number]  Mobility Specialist Time Calculation (min) (ACUTE ONLY) 12 min   Pt received in bed, agreeable to transfer B>C via RW  and MinA. Tolerated well, denies dizziness. C/o R leg discomfort. Left pt in chair with alarm on, all needs met, call bell in reach. Legs elevated and pt eating breakfast.   [name] [name] Mobility Specialist Please contact via SecureChat or  Rehab office at [phone]

## 2023-10-01 NOTE — Progress Notes (Signed)
 Physical Therapy Treatment Patient Details Name: [name] [name] MRN: [id number] DOB: Oct 09, 1945 Today's Date: 10/01/2023   History of Present Illness 78 y.o. female presents to [facility]/28[facility]23 for R LE endarterectomy, angioplasty, and thrombectomy with wound vac application. PMHx: chronic pain, T2DM, MI, COPD, tobacco use, HTN, OA    PT Comments  Patient feeling and doing much better today. Able to stand and ambulate with CGA with BP increasing as would be normal. She is eager to discharge home and not to SNF. From a PT perspective, discharge home seems much more likely, however would also like OT's assessment for ?ability to go home.     If plan is discharge home, recommend the following: A little help with bathing/dressing/bathroom;Assistance with cooking/housework;Assist for transportation   Can travel by private vehicle        Equipment Recommendations  BSC/3in1    Recommendations for Other Services       Precautions / Restrictions Precautions Precautions: Fall Precaution/Restrictions Comments: Low BP, R groin wound vac Restrictions Weight Bearing Restrictions Per Provider Order: No     Mobility  Bed Mobility               General bed mobility comments: up in recliner    Transfers Overall transfer level: Needs assistance Equipment used: Rolling walker (2 wheels) Transfers: Sit to/from Stand, Bed to chair/wheelchair/BSC Sit to Stand: Contact guard assist   Step pivot transfers: Contact guard assist       General transfer comment: close guarding due to previous drop in BP (none today)    Ambulation/Gait Ambulation/Gait assistance: Contact guard assist Gait Distance (Feet): 60 Feet Assistive device: Rolling walker (2 wheels) Gait Pattern/deviations: Step-through pattern, Decreased stride length Gait velocity: decr Gait velocity interpretation: 1.31 - 2.62 ft/sec, indicative of limited community ambulator   General Gait Details: feeling more like herself with  improved foot clearance and velocity   Stairs             Wheelchair Mobility     Tilt Bed    Modified Rankin (Stroke Patients Only)       Balance Overall balance assessment: Needs assistance Sitting-balance support: No upper extremity supported, Feet supported Sitting balance-Leahy Scale: Fair     Standing balance support: During functional activity, No upper extremity supported Standing balance-Leahy Scale: Fair                              Hotel manager: No apparent difficulties  Cognition Arousal: Alert Behavior During Therapy: WFL for tasks assessed/performed   PT - Cognitive impairments: No apparent impairments                         Following commands: Intact      Cueing Cueing Techniques: Verbal cues  Exercises      General Comments General comments (skin integrity, edema, etc.): Reports she feels much better today. BP prior to ambulation 91/55, after 146/93      Pertinent Vitals/Pain Pain Assessment Pain Assessment: Faces Faces Pain Scale: Hurts little more Pain Location: R foot Pain Descriptors / Indicators: Aching, Discomfort Pain Intervention(s): Limited activity within patient's tolerance, Monitored during session    Home Living                          Prior Function            PT Goals (  current goals can now be found in the care plan section) Acute Rehab PT Goals Patient Stated Goal: to get stronger PT Goal Formulation: With patient Time For Goal Achievement: 10/13/23 Potential to Achieve Goals: Good Progress towards PT goals: Progressing toward goals    Frequency    Min 2X/week      PT Plan      Co-evaluation              AM-PAC PT "6 Clicks" Mobility   Outcome Measure  Help needed turning from your back to your side while in a flat bed without using bedrails?: A Little Help needed moving from lying on your back to sitting on the side of a flat  bed without using bedrails?: A Little Help needed moving to and from a bed to a chair (including a wheelchair)?: A Little Help needed standing up from a chair using your arms (e.g., wheelchair or bedside chair)?: A Little Help needed to walk in hospital room?: A Little Help needed climbing 3-5 steps with a railing? : A Lot 6 Click Score: 17    End of Session Equipment Utilized During Treatment: Gait belt Activity Tolerance: Patient tolerated treatment well Patient left: in chair;with call bell/phone within reach;with chair alarm set Nurse Communication: Mobility status;Other (comment) (better and likely can discharge home) PT Visit Diagnosis: Unsteadiness on feet (R26.81);Muscle weakness (generalized) (M62.81);Other abnormalities of gait and mobility (R26.89)     Time: 4540-9811 PT Time Calculation (min) (ACUTE ONLY): 24 min  Charges:    $Gait Training: 23-37 mins PT General Charges $$ ACUTE PT VISIT: 1 Visit                      Gayle Kava, PT Acute Rehabilitation Services  Office [phone]    [place] Leep 10/01/2023, 1:06 PM

## 2023-10-01 NOTE — Plan of Care (Signed)
°  Problem: Education: °Goal: Knowledge of General Education information will improve °Description: Including pain rating scale, medication(s)/side effects and non-pharmacologic comfort measures °Outcome: Progressing °  °Problem: Clinical Measurements: °Goal: Cardiovascular complication will be avoided °Outcome: Progressing °  °Problem: Activity: °Goal: Risk for activity intolerance will decrease °Outcome: Progressing °  °

## 2023-10-01 NOTE — Plan of Care (Signed)

## 2023-10-02 ENCOUNTER — Ambulatory Visit (HOSPITAL_BASED_OUTPATIENT_CLINIC_OR_DEPARTMENT_OTHER): Admitting: Radiology

## 2023-10-02 ENCOUNTER — Other Ambulatory Visit (HOSPITAL_COMMUNITY): Payer: Self-pay

## 2023-10-02 MED ORDER — ROSUVASTATIN CALCIUM 20 MG PO TABS
20.0000 mg | ORAL_TABLET | Freq: Every day | ORAL | 1 refills | Status: AC
  Filled 2023-10-02: qty 90, 90d supply, fill #0

## 2023-10-02 NOTE — Progress Notes (Signed)
 Physical Therapy Treatment Patient Details Name: [name] [name] MRN: [id number] DOB: [birth year] Today's Date: 10/02/2023   History of Present Illness 78 y.o. female presents to [facility] for R LE endarterectomy, angioplasty, and thrombectomy with wound vac application. PMHx: chronic pain, T2DM, MI, COPD, tobacco use, HTN, OA    PT Comments  Pt sitting up in chair on arrival, pleasant and agreeable to therapy session, with continued progress toward acute goals. Pt able to increase gait distance this session with rollator support and CGA for safety. Educated pt on rollator use with pt verbalizing and demonstrating understanding. Also educated pt on incentive spirometer use, continued mobility and rollator use after discharge. Pt continues to benefit from skilled PT services to progress toward functional mobility goals.     If plan is discharge home, recommend the following: A little help with bathing/dressing/bathroom;Assistance with cooking/housework;Assist for transportation   Can travel by private vehicle        Equipment Recommendations  BSC/3in1    Recommendations for Other Services       Precautions / Restrictions Precautions Precautions: Fall Precaution/Restrictions Comments: Low BP, R groin wound vac Restrictions Weight Bearing Restrictions Per Provider Order: No     Mobility  Bed Mobility               General bed mobility comments: sititing in chair on arrival    Transfers Overall transfer level: Needs assistance Equipment used: Rollator (4 wheels) Transfers: Sit to/from Stand Sit to Stand: Contact guard assist           General transfer comment: CGA for saftey, good handplace on rise    Ambulation/Gait Ambulation/Gait assistance: Contact guard assist Gait Distance (Feet): 155 Feet Assistive device: Rollator (4 wheels) Gait Pattern/deviations: Step-through pattern, Decreased stride length Gait velocity: decr     General Gait Details: CGA for  saftey   Stairs             Wheelchair Mobility     Tilt Bed    Modified Rankin (Stroke Patients Only)       Balance Overall balance assessment: Needs assistance Sitting-balance support: No upper extremity supported, Feet supported Sitting balance-Leahy Scale: Fair     Standing balance support: During functional activity, Reliant on assistive device for balance Standing balance-Leahy Scale: Fair Standing balance comment: reliant on RW during ambulation                            Communication Communication Communication: No apparent difficulties  Cognition Arousal: Alert Behavior During Therapy: WFL for tasks assessed/performed   PT - Cognitive impairments: No apparent impairments                         Following commands: Intact      Cueing Cueing Techniques: Verbal cues  Exercises      General Comments General comments (skin integrity, edema, etc.): Bp stable throughout, prior to ambulation 136/66, post ambulation 120/56      Pertinent Vitals/Pain Pain Assessment Pain Assessment: Faces Faces Pain Scale: Hurts a little bit Pain Location: R foot Pain Descriptors / Indicators: Discomfort Pain Intervention(s): Monitored during session, Limited activity within patient's tolerance    Home Living                          Prior Function            PT Goals (current  goals can now be found in the care plan section) Acute Rehab PT Goals PT Goal Formulation: With patient Time For Goal Achievement: 10/13/23 Progress towards PT goals: Progressing toward goals    Frequency    Min 2X/week      PT Plan      Co-evaluation              AM-PAC PT "6 Clicks" Mobility   Outcome Measure  Help needed turning from your back to your side while in a flat bed without using bedrails?: A Little Help needed moving from lying on your back to sitting on the side of a flat bed without using bedrails?: A Little Help needed  moving to and from a bed to a chair (including a wheelchair)?: A Little Help needed standing up from a chair using your arms (e.g., wheelchair or bedside chair)?: A Little Help needed to walk in hospital room?: A Little Help needed climbing 3-5 steps with a railing? : A Lot 6 Click Score: 17    End of Session Equipment Utilized During Treatment: Gait belt Activity Tolerance: Patient tolerated treatment well Patient left: in chair;with call bell/phone within reach;with chair alarm set Nurse Communication: Mobility status PT Visit Diagnosis: Unsteadiness on feet (R26.81);Muscle weakness (generalized) (M62.81);Other abnormalities of gait and mobility (R26.89)     Time: 1610-9604 PT Time Calculation (min) (ACUTE ONLY): 22 min  Charges:    $Gait Training: 8-22 mins PT General Charges $$ ACUTE PT VISIT: 1 Visit                     Forrest Iha 10/02/2023, 12:23 PM

## 2023-10-02 NOTE — TOC Transition Note (Signed)
 Transition of Care [facility]) - Discharge Note Sherin Dingwall RN, BSN Transitions of Care Unit 4E- RN Case Manager See Treatment Team for direct phone #   Patient Details  Name: [name] [name] MRN: [id number] Date of Birth: 1945/11/10  [facility] CM/SW Contact:  Rox Cope, RN Phone Number: 10/02/2023, 11:34 AM   Clinical Narrative:    Pt stable for transition home today. HH orders placed for HHPT/OT.  CM received notice from [facility] that [facility] office made referral for any HH needs.   CM spoke with pt at bedside. Discussed transition needs. Per pt [place]. She has RW and declines any DME needs at this time.  Choice offered for [facility] needs and explained [facility] office referral to [facility]. Pt voice she is agreeable to use [facility] for her HH needs.  Pt states grand-daughter to transport home later this evening after she gets off work.   Address, phone # and PCP all confirmed in epic.   [facility] liaison notified for start of care needs- and to follow up with pt to schedule.    Final next level of care: Home w Home Health Services Barriers to Discharge: Barriers Resolved   Patient Goals and CMS Choice Patient states their goals for this hospitalization and ongoing recovery are:: To return home when possible   Choice offered to / list presented to : Patient      Discharge Placement                 Home w/ [facility]      Discharge Plan and Services Additional resources added to the After Visit Summary for   In-house Referral: Clinical Social Work Discharge Planning Services: CM Consult Post Acute Care Choice: Home Health          DME Arranged: N/A DME Agency: NA       HH Arranged: PT, OT HH Agency: [facility] Date HH Agency Contacted: 10/02/23 Time HH Agency Contacted: 1000 Representative spoke with at [facility] Agency: Glenora Laos  Social Drivers of Health (SDOH) Interventions SDOH Screenings   Food  Insecurity: No Food Insecurity (09/28/2023)  Housing: Low Risk  (09/28/2023)  Transportation Needs: Unmet Transportation Needs (09/28/2023)  Utilities: Not At Risk (09/28/2023)  Alcohol Screen: Low Risk  (07/11/2023)  Depression (PHQ2-9): High Risk (08/14/2023)  Financial Resource Strain: Low Risk  (07/13/2023)  Physical Activity: Inactive (08/01/2022)  Social Connections: Socially Isolated (09/28/2023)  Stress: Stress Concern Present (08/01/2022)  Tobacco Use: High Risk (09/28/2023)  Health Literacy: Adequate Health Literacy (07/11/2023)     Readmission Risk Interventions    10/02/2023   11:11 AM  Readmission Risk Prevention Plan  Post Dischage Appt Complete  Medication Screening Complete  Transportation Screening Complete

## 2023-10-02 NOTE — Discharge Summary (Signed)
 Discharge Summary     [name] [name] 01/23/1946 78 y.o. female  [id number]  Admission Date: 09/28/2023  Discharge Date: 10/02/2023  Physician: Margherita Shell, MD  Admission Diagnosis: Atherosclerosis of native artery of right lower extremity with rest pain (HCC) [I70.221] Peripheral artery disease (HCC) [I73.9] PAD (peripheral artery disease) (HCC) [I73.9]  HPI:   This is a 78 y.o. female , who presented to the emergency department with a 4 to 5-week history of right foot pain, which is different from her baseline discomfort.  She states that this will occasionally cramp and bother her with short distance walking.  It does wake her up at night.  She also complains of numbness and swelling in her feet particularly swelling in her toes.   The patient does have chronic pain and is seen at the pain center.  She has a history of type 2 diabetes.  She has a history of MI.  She suffers from COPD secondary to chronic tobacco smoking.  She is on a statin for hypercholesterolemia.  She is medically managed for hypertension.  Hospital Course:  The patient was admitted to the hospital and taken to the operating room on 09/28/2023 and underwent: Right iliofemoral endarterectomy with GSV patch angioplasty, SFA thrombectomy and Prevena placement 09/28/2023 by Dr. Charlotte Cookey     Findings: The right common femoral artery was occluded. There was subacute thrombus within the superficial femoral artery that was able to be removed with a Fogarty catheter. Endarterectomy was performed from the distal external iliac artery down through the proximal superficial femoral artery. Eversion endarterectomy was performed of the profundofemoral artery. A vein patch was used for patch angioplasty. The length of the patch was 8 cm   The pt tolerated the procedure well and was transported to the PACU in good condition.   By POD 1, she had improved doppler flow right foot.  Encouraged mobility.  Incisional prevena vac with  good seal.  That day she did have an episode of orthostatic hypotension.   BP in Supine- 141/53 BP seated in recliner- 54/36, 53 BPM BP seated in recliner after 3 min- 64/39, 53 BPM    This nurse initiated the 500 ml's bolus  Current BP 131/52 mmHg Map 74. Rapid response and provider were both notified during the process. Patient was transferred back to bed and stated that she was feeling better.    Patient stated she has had events like this before and she knows to sit on the side of the bed and move her lower extremities before standing.     Hold BP medication if systolic < 140.    POD 2, she did not have any further orthostatic events.   POD 3, she was doing well and ambulating well.  She continued to have brisk doppler flow right foot as well as left PT.    POD 4, she had worked with PT and HHPT was recommended and this had been arranged.  She does live in assisted living.  The prevena did not have a seal so this was removed and her incision looked great.  She is discharged home with plans to return to office in a few weeks to see Dr. Susi Eric for discussions about right leg intervention.  Groin wound care was discussed with pt.    CBC    Component Value Date/Time   WBC 5.0 10/01/2023 0409   RBC 2.64 (L) 10/01/2023 0409   HGB 8.6 (L) 10/01/2023 0409   HGB 12.1 08/14/2023 1139  HCT 25.5 (L) 10/01/2023 0409   HCT 35.7 08/14/2023 1139   PLT 130 (L) 10/01/2023 0409   PLT 249 08/14/2023 1139   MCV 96.6 10/01/2023 0409   MCV 96 08/14/2023 1139   MCH 32.6 10/01/2023 0409   MCHC 33.7 10/01/2023 0409   RDW 13.8 10/01/2023 0409   RDW 11.9 08/14/2023 1139   LYMPHSABS 1.8 09/18/2023 1703   LYMPHSABS 2.1 08/14/2023 1139   MONOABS 0.5 09/18/2023 1703   EOSABS 0.1 09/18/2023 1703   EOSABS 0.1 08/14/2023 1139   BASOSABS 0.1 09/18/2023 1703   BASOSABS 0.1 08/14/2023 1139    BMET    Component Value Date/Time   NA 135 10/01/2023 0409   NA 138 08/14/2023 1139   K 3.4 (L) 10/01/2023  0409   CL 102 10/01/2023 0409   CO2 29 10/01/2023 0409   GLUCOSE 95 10/01/2023 0409   BUN 8 10/01/2023 0409   BUN 9 08/14/2023 1139   CREATININE 0.57 10/01/2023 0409   CALCIUM  8.4 (L) 10/01/2023 0409   GFRNONAA >60 10/01/2023 0409   GFRAA 100 07/14/2020 1442     Discharge Instructions     Discharge patient   Complete by: As directed    Discharge after pt has meds from [facility] and she has been seen by Dr. Charlotte Cookey   Discharge disposition: 01-Home or Self Care   Discharge patient date: 10/02/2023       Discharge Diagnosis:  Atherosclerosis of native artery of right lower extremity with rest pain (HCC) [I70.221] Peripheral artery disease (HCC) [I73.9] PAD (peripheral artery disease) (HCC) [I73.9]  Secondary Diagnosis: Patient Active Problem List   Diagnosis Date Noted   Peripheral artery disease (HCC) 09/28/2023   PAD (peripheral artery disease) (HCC) 09/28/2023   Edema of left lower extremity 09/20/2023   Depression, major, single episode, severe (HCC) 08/14/2023   Type 2 diabetes mellitus with other circulatory complications (HCC) 08/14/2023   Acute cough 04/24/2023   Insomnia 04/24/2023   Vitamin D  deficiency 01/08/2023   Chronic neck pain 01/08/2023   Chronic pain syndrome 10/07/2022   Grief 10/03/2022   Mild vitamin D  deficiency 10/03/2022   Personal history of nicotine  dependence 10/03/2022   Neck pain 09/15/2022   Temporal headache 09/15/2022   Hair loss 09/15/2022   Lumbar radiculopathy 07/01/2022   Spinal stenosis of lumbar region 07/01/2022   Cigarette smoker 02/22/2022   Anxiety 02/21/2022   Aortic atherosclerosis (HCC) 02/21/2022   Ascending aorta dilation (HCC) 02/21/2022   Asthma 02/21/2022   CAD (coronary artery disease) 02/21/2022   History of kidney stones 02/21/2022   Hypertension associated with diabetes (HCC) 02/21/2022   Lupus 02/21/2022   Paraesophageal hernia 02/21/2022   Presbyesophagus 02/21/2022   PSVT (paroxysmal supraventricular  tachycardia) (HCC) 02/21/2022   Schatzki's ring 02/21/2022   BMI 24.0-24.9, adult 02/14/2022   Sciatica 01/31/2022   Renal calculus 12/21/2021   S/P repair of paraesophageal hernia 02/22/2021   Coronary artery disease involving native coronary artery of native heart without angina pectoris 11/18/2020   Cataract 11/16/2020   Osteoarthritis    Enlarged aorta (HCC)    Depression, major, recurrent, mild (HCC) 03/30/2020   PAC (premature atrial contraction) 02/03/2020   PAT (paroxysmal atrial tachycardia) (HCC) 02/03/2020   Diastolic dysfunction 09/11/2019   Tobacco use 08/19/2019   Hyperlipidemia 08/07/2019   Hypertensive heart disease 08/07/2019   COPD (chronic obstructive pulmonary disease) (HCC) 08/07/2019   GERD with esophagitis 08/07/2019   Degenerative joint disease involving multiple joints 09/28/2017   Long-term current use  of opiate analgesic 06/25/2017   Pain of right hip joint 11/22/2015   Left arm pain 09/27/2015   S/P total knee arthroplasty 02/15/2015   Primary osteoarthritis of left knee 02/04/2015   Past Medical History:  Diagnosis Date   Anxiety    Aortic atherosclerosis (HCC)    Ascending aorta dilation (HCC)    a.) TTE 09/02/19 --> mild; measured 39mm.   Asthma    BMI 24.0-24.9, adult 02/14/2022   CAD (coronary artery disease)    a,) CTA 08/24/20 --> mild CAD (25-49%) in RCA, LAD, LCx; CAC/Agatston score 509.   Cataract    Chronic left shoulder pain 09/27/2015   COPD (chronic obstructive pulmonary disease) (HCC)    Coronary artery disease involving native coronary artery of native heart without angina pectoris 11/18/2020   Degenerative joint disease involving multiple joints 09/28/2017   Depression    Depression, major, single episode, severe (HCC) 03/30/2020   Diastolic dysfunction    a.) TTE 09/02/19 --> LVEF 60-65%; G2DD. b.) TTE 07/23/20 --> LVEF 70-75%; G1DD.   Enlarged aorta (HCC)    Essential hypertension 08/07/2019   GERD (gastroesophageal reflux  disease)    GERD with esophagitis 08/07/2019   History of kidney stones    Hyperlipidemia    Hypertension    Long-term current use of opiate analgesic 06/25/2017   Lupus    Myocardial infarction (HCC)    Osteoarthritis    PAC (premature atrial contraction)    a.) Holter 12/04/19 --> occassional; 1.1% PAC burden.   Pain of right hip joint 11/22/2015   Paraesophageal hernia    PAT (paroxysmal atrial tachycardia) (HCC) 02/03/2020   Presbyesophagus    Primary osteoarthritis of left knee 02/04/2015   PSVT (paroxysmal supraventricular tachycardia) (HCC)    a.) Holter 12/04/19 --> 22 runs.   Renal calculus 12/21/2021   S/P repair of paraesophageal hernia 02/22/2021   S/P total knee arthroplasty 02/15/2015   Schatzki's ring    Sciatica 01/31/2022   Tobacco use 08/19/2019   Type 2 diabetes mellitus with other specified complication (HCC) 08/07/2019     Allergies as of 10/02/2023       Reactions   Meperidine Anaphylaxis, Other (See Comments)   Blood pressure dropped, also   Meperidine Hcl Anaphylaxis, Other (See Comments)   B/P dropped, also   Gabapentin  Nausea And Vomiting      Influenza Vaccines Other (See Comments)   Flu-like symptoms - patient states that she can tolerate this    Other Rash, Other (See Comments)   asparagus        Medication List     PAUSE taking these medications    lisinopril -hydrochlorothiazide  20-12.5 MG tablet Wait to take this until your doctor or other care provider tells you to start again. Commonly known as: ZESTORETIC  TAKE TWO (2) TABLETS BY MOUTH EVERY MORNING       STOP taking these medications    finasteride  1 MG tablet Commonly known as: PROPECIA        TAKE these medications    albuterol  108 (90 Base) MCG/ACT inhaler Commonly known as: VENTOLIN  HFA Inhale 2 puffs by mouth every 6 hours as needed for shortness of breath or wheezing   albuterol  (2.5 MG/3ML) 0.083% nebulizer solution Commonly known as: PROVENTIL  Inhale 1 vial via  nebulizer every 6 hours as needed for wheezing or shortness of breath   aspirin  EC 81 MG tablet Take 1 tablet (81 mg total) by mouth daily. Swallow whole.   Breztri  Aerosphere 160-9-4.8 MCG/ACT Aero  inhaler Generic drug: budeson-glycopyrrolate -formoterol  INHALE TWO (2) PUFFS BY MOUTH INTO THE LUNGS TWICE DAILY AS NEEDED FOR SHORTNESS OF BREATH   buPROPion  150 MG 24 hr tablet Commonly known as: WELLBUTRIN  XL Take 1 tablet by mouth every morning   cloNIDine  0.1 MG tablet Commonly known as: CATAPRES  TAKE ONE (1) TABLET BY MOUTH TWICE DAILY   hydrOXYzine  25 MG capsule Commonly known as: VISTARIL  Take 1 capsule (25 mg total) by mouth every 8 (eight) hours as needed.   isosorbide  mononitrate 30 MG 24 hr tablet Commonly known as: IMDUR  Take 1 tablet by mouth every day   meclizine  25 MG tablet Commonly known as: ANTIVERT  TAKE 1 TABLET BY MOUTH THREE TIMES DAILY AS NEEDED FOR DIZZINESS *REFILL REQUEST*   nitroGLYCERIN  0.4 MG SL tablet Commonly known as: NITROSTAT  Place 1 tablet (0.4 mg total) under the tongue every 5 (five) minutes as needed for chest pain.   omeprazole  40 MG capsule Commonly known as: PRILOSEC TAKE 1 CAPSULE BY MOUTH EVERY MORNING   oxyCODONE -acetaminophen  10-325 MG tablet Commonly known as: PERCOCET Take 1 tablet by mouth every 6 (six) hours as needed for moderate pain (pain score 4-6).   potassium chloride  10 MEQ tablet Commonly known as: KLOR-CON  M TAKE 1 TABLET BY MOUTH ONCE DAILY   promethazine  25 MG tablet Commonly known as: PHENERGAN  Take 1 tablet by mouth every 6 hours as needed for nausea and vomiting   rosuvastatin  20 MG tablet Commonly known as: CRESTOR  Take 1 tablet (20 mg total) by mouth at bedtime. What changed:  medication strength how much to take   traZODone  100 MG tablet Commonly known as: DESYREL  TAKE 1 TABLET BY MOUTH DAILY AT BEDTIME   Vitamin D  (Ergocalciferol ) 1.25 MG (50000 UNIT) Caps capsule Commonly known as:  DRISDOL  TAKE ONE CAPSULE BY MOUTH ONCE WEEKLY ON MONDAY        Discharge Instructions: [facility] Discharge instructions Lower Extremity Bypass Surgery  Please refer to the following instruction for your post-procedure care. Your surgeon or physician assistant will discuss any changes with you.  Activity  You are encouraged to walk as much as you can. You can slowly return to normal activities during the month after your surgery. Avoid strenuous activity and heavy lifting until your doctor tells you it's OK. Avoid activities such as vacuuming or swinging a golf club. Do not drive until your doctor give the OK and you are no longer taking prescription pain medications. It is also normal to have difficulty with sleep habits, eating and bowel movement after surgery. These will go away with time.  Bathing/Showering  You may shower after you go home. Do not soak in a bathtub, hot tub, or swim until the incision heals completely.  Incision Care  Clean your incision with mild soap and water. Shower every day. Pat the area dry with a clean towel. You do not need a bandage unless otherwise instructed. Do not apply any ointments or creams to your incision. If you have open wounds you will be instructed how to care for them or a visiting nurse may be arranged for you. If you have staples or sutures along your incision they will be removed at your post-op appointment. You may have skin glue on your incision. Do not peel it off. It will come off on its own in about one week.  Wash the groin wound with soap and water daily and pat dry. (No tub bath-only shower)  Then put a dry  gauze or washcloth in the groin to keep this area dry to help prevent wound infection.  Do this daily and as needed.  Do not use Vaseline or neosporin on your incisions.  Only use soap and water on your incisions and then protect and keep dry.  Diet  Resume your normal diet. There are no special  food restrictions following this procedure. A low fat/ low cholesterol diet is recommended for all patients with vascular disease. In order to heal from your surgery, it is CRITICAL to get adequate nutrition. Your body requires vitamins, minerals, and protein. Vegetables are the best source of vitamins and minerals. Vegetables also provide the perfect balance of protein. Processed food has little nutritional value, so try to avoid this.  Medications  Resume taking all your medications unless your doctor or Physician Assistant tells you not to. If your incision is causing pain, you may take over-the-counter pain relievers such as acetaminophen  (Tylenol ). If you were prescribed a stronger pain medication, please aware these medication can cause nausea and constipation. Prevent nausea by taking the medication with a snack or meal. Avoid constipation by drinking plenty of fluids and eating foods with high amount of fiber, such as fruits, vegetables, and grains. Take Colace 100 mg (an over-the-counter stool softener) twice a day as needed for constipation.  Do not take Tylenol  if you are taking prescription pain medications.  Follow Up  Our office will schedule a follow up appointment 2-3 weeks following discharge.  Please call us  immediately for any of the following conditions  Severe or worsening pain in your legs or feet while at rest or while walking Increase pain, redness, warmth, or drainage (pus) from your incision site(s) Fever of 101 degree or higher The swelling in your leg with the bypass suddenly worsens and becomes more painful than when you were in the hospital If you have been instructed to feel your graft pulse then you should do so every day. If you can no longer feel this pulse, call the office immediately. Not all patients are given this instruction.  Leg swelling is common after leg bypass surgery.  The swelling should improve over a few months following surgery. To improve the  swelling, you may elevate your legs above the level of your heart while you are sitting or resting. Your surgeon or physician assistant may ask you to apply an ACE wrap or wear compression (TED) stockings to help to reduce swelling.  Reduce your risk of vascular disease  Stop smoking. If you would like help call [facility] at 1-800-QUIT-NOW (332-531-3096) or [place] at 6263092455.  Manage your cholesterol Maintain a desired weight Control your diabetes weight Control your diabetes Keep your blood pressure down  If you have any questions, please call the office at [phone]   Prescriptions given: 1.Crestor  20mg  daily (dose change) #90 with one refill PDMP reviewed and no narcotics prescribed at discharge  Disposition: home with HHPT/OT  Patient's condition: is Good  Follow up: 1. [facility] in 2-3 weeks for incision check on Dr. Charlotte Cookey clinic day.   Maryanna Smart, PA-C [facility] [phone] 10/02/2023  7:27 AM  - For VQI Registry use ---   Post-op:  Wound infection: No  Graft infection: No  Transfusion: No    If yes, n/a units given New Arrhythmia: No Ipsilateral amputation: No, [ ]  Minor, [ ]  BKA, [ ]  AKA Discharge patency: [x ] Primary, [ ]  Primary assisted, [ ]  Secondary, [ ]  Occluded Patency judged by: [ ]   Dopper only, [ ]  Palpable graft pulse, [x]  Palpable distal pulse, [ ]  ABI inc. > 0.15, [ ]  Duplex Discharge ABI: R not done, L  D/C Ambulatory Status: Ambulatory with Assistance  Complications: MI: No, [ ]  Troponin only, [ ]  EKG or Clinical CHF: No Resp failure:No, [ ]  Pneumonia, [ ]  Ventilator Chg in renal function: No, [ ]  Inc. Cr > 0.5, [ ]  Temp. Dialysis,  [ ]  Permanent dialysis Stroke: No, [ ]  Minor, [ ]  Major Return to OR: No  Reason for return to OR: [ ]  Bleeding, [ ]  Infection, [ ]  Thrombosis, [ ]  Revision  Discharge medications: Statin use:  yes ASA use:  yes Plavix  use:  no Beta blocker use: no CCB use:  No ACEI  use:   no ARB use:  no Coumadin use: no Clonidine : yes

## 2023-10-02 NOTE — Progress Notes (Addendum)
 Progress Note    10/02/2023 6:42 AM 4 Days Post-Op  Subjective:  wants to go home.  Lives in assisted living.  Walked several times in the room yesterday.  Says they could not get the Prevena to work  Afebrile HR 50's-70's  120's-140's systolic 99% RA  Vitals:   10/01/23 2354 10/02/23 0258  BP: (!) 142/60 (!) 125/53  Pulse:  64  Resp: 19 19  Temp: 98.6 F (37 C) 98.1 F (36.7 C)  SpO2: 98% 99%    Physical Exam: General:  no distress sitting in chair Cardiac:  regular Lungs:  non labored Incisions:  prevena removed and incision looks good.   Extremities:  palpable right DP pulse Abdomen:  soft  CBC    Component Value Date/Time   WBC 5.0 10/01/2023 0409   RBC 2.64 (L) 10/01/2023 0409   HGB 8.6 (L) 10/01/2023 0409   HGB 12.1 08/14/2023 1139   HCT 25.5 (L) 10/01/2023 0409   HCT 35.7 08/14/2023 1139   PLT 130 (L) 10/01/2023 0409   PLT 249 08/14/2023 1139   MCV 96.6 10/01/2023 0409   MCV 96 08/14/2023 1139   MCH 32.6 10/01/2023 0409   MCHC 33.7 10/01/2023 0409   RDW 13.8 10/01/2023 0409   RDW 11.9 08/14/2023 1139   LYMPHSABS 1.8 09/18/2023 1703   LYMPHSABS 2.1 08/14/2023 1139   MONOABS 0.5 09/18/2023 1703   EOSABS 0.1 09/18/2023 1703   EOSABS 0.1 08/14/2023 1139   BASOSABS 0.1 09/18/2023 1703   BASOSABS 0.1 08/14/2023 1139    BMET    Component Value Date/Time   NA 135 10/01/2023 0409   NA 138 08/14/2023 1139   K 3.4 (L) 10/01/2023 0409   CL 102 10/01/2023 0409   CO2 29 10/01/2023 0409   GLUCOSE 95 10/01/2023 0409   BUN 8 10/01/2023 0409   BUN 9 08/14/2023 1139   CREATININE 0.57 10/01/2023 0409   CALCIUM  8.4 (L) 10/01/2023 0409   GFRNONAA >60 10/01/2023 0409   GFRAA 100 07/14/2020 1442    INR    Component Value Date/Time   INR 1.0 09/28/2023 0550     Intake/Output Summary (Last 24 hours) at 10/02/2023 8469 Last data filed at 10/01/2023 1136 Gross per 24 hour  Intake 240 ml  Output 0 ml  Net 240 ml      Assessment/Plan:  78 y.o.  female is s/p:  Right iliofemoral endarterectomy with GSV patch angioplasty, SFA thrombectomy and Prevena placement 09/28/2023 by Dr. Charlotte Cookey   4 Days Post-Op   -pt doing well with palpable right DP pulse.   -prevena vac lost its charge and no cord in the room.  I removed the prevena and discussed groin wound care with pt and to keep dry unless cleansing in the shower.  She expressed understanding.   -pt with no BP issues.  Her ACEI/diuretic has been held here and she will continue to hold this until she follows up with her PCP.  Her clonidine  has been continued. -PT/OT recommending HHPT/OT and BSC and 3in1.  She does have rails in the bathroom and emergency pull switch.  Face to face ordered for [facility] needs -DVT prophylaxis:  sq heparin  -discharge home today with HHPT/OT -PDMP reviewed-will not prescribe narcotics at discharge.    Maryanna Smart, PA-C [facility] [phone] 10/02/2023 6:42 AM   I agree with the above.  Have seen and evaluated patient.  She is ready to go home.  She has a palpable right dorsalis pedis pulse.  Her groin is healing nicely.  She will follow-up in the office in a few weeks.  [name]

## 2023-10-02 NOTE — Progress Notes (Signed)
 Occupational Therapy Treatment Patient Details Name: [name] [name] MRN: [id number] DOB: September 05, 1945 Today's Date: 10/02/2023   History of present illness 78 y.o. female presents to [facility] 09/28/23 for R LE endarterectomy, angioplasty, and thrombectomy with wound vac application. PMHx: chronic pain, T2DM, MI, COPD, tobacco use, HTN, OA   OT comments  Pt making good progress with functional goals. Pt able to perform LB ADLs at min A/CGA and functional mobility and transfers CGA using RW. Pt states that she feels much better and is eager to return home. Continue to recommend home with [facility] OT services after acute stay d/c. BP and HR stable throughout activity, no LOB. OT will continue to follow acutely to maximize level of function and safety      If plan is discharge home, recommend the following:  A little help with walking and/or transfers;A little help with bathing/dressing/bathroom;Assist for transportation;Assistance with cooking/housework   Equipment Recommendations  None recommended by OT    Recommendations for Other Services      Precautions / Restrictions Precautions Precautions: Fall Precaution/Restrictions Comments: Low BP, R groin wound vac Restrictions Weight Bearing Restrictions Per Provider Order: No       Mobility Bed Mobility               General bed mobility comments: sititing in chair on arrival    Transfers Overall transfer level: Needs assistance Equipment used: Rolling walker (2 wheels)   Sit to Stand: Contact guard assist     Step pivot transfers: Contact guard assist           Balance Overall balance assessment: Needs assistance Sitting-balance support: No upper extremity supported, Feet supported Sitting balance-Leahy Scale: Good     Standing balance support: During functional activity, Reliant on assistive device for balance Standing balance-Leahy Scale: Fair                             ADL either performed or assessed with  clinical judgement   ADL Overall ADL's : Needs assistance/impaired     Grooming: Wash/dry hands;Wash/dry face;Supervision/safety;Standing       Lower Body Bathing: Contact guard assist;Sitting/lateral leans;Sit to/from stand       Lower Body Dressing: Minimal assistance;Contact guard assist;Sitting/lateral leans;Sit to/from stand   Toilet Transfer: Contact guard assist;Ambulation;Rolling walker (2 wheels);Regular Toilet;Grab bars   Toileting- Clothing Manipulation and Hygiene: Contact guard assist;Sit to/from stand       Functional mobility during ADLs: Contact guard assist      Extremity/Trunk Assessment Upper Extremity Assessment Upper Extremity Assessment: Generalized weakness;Right hand dominant   Lower Extremity Assessment Lower Extremity Assessment: Defer to PT evaluation   Cervical / Trunk Assessment Cervical / Trunk Assessment: Normal    Vision Baseline Vision/History: 1 Wears glasses Ability to See in Adequate Light: 0 Adequate Patient Visual Report: No change from baseline     Perception     Praxis     Communication Communication Communication: No apparent difficulties   Cognition Arousal: Alert Behavior During Therapy: WFL for tasks assessed/performed Cognition: No apparent impairments                               Following commands: Intact        Cueing   Cueing Techniques: Verbal cues  Exercises      Shoulder Instructions       General Comments Bp stable throughout, prior to  ambulation 136/66, post ambulation 120/56    Pertinent Vitals/ Pain       Pain Assessment Pain Assessment: Faces Faces Pain Scale: Hurts a little bit Pain Location: R foot Pain Descriptors / Indicators: Discomfort Pain Intervention(s): Monitored during session, Repositioned  Home Living                                          Prior Functioning/Environment              Frequency  Min 2X/week        Progress  Toward Goals  OT Goals(current goals can now be found in the care plan section)  Progress towards OT goals: Progressing toward goals     Plan      Co-evaluation                 AM-PAC OT "6 Clicks" Daily Activity     Outcome Measure   Help from another person eating meals?: None Help from another person taking care of personal grooming?: A Little Help from another person toileting, which includes using toliet, bedpan, or urinal?: A Little Help from another person bathing (including washing, rinsing, drying)?: A Little Help from another person to put on and taking off regular upper body clothing?: A Little Help from another person to put on and taking off regular lower body clothing?: A Little 6 Click Score: 19    End of Session Equipment Utilized During Treatment: Gait belt;Rolling walker (2 wheels)  OT Visit Diagnosis: Unsteadiness on feet (R26.81);Other abnormalities of gait and mobility (R26.89);Muscle weakness (generalized) (M62.81);Pain Pain - Right/Left: Right Pain - part of body: Ankle and joints of foot   Activity Tolerance Patient tolerated treatment well   Patient Left in chair;with call bell/phone within reach;with chair alarm set   Nurse Communication Mobility status        Time: 1610-9604 OT Time Calculation (min): 27 min  Charges: OT General Charges $OT Visit: 1 Visit OT Treatments $Self Care/Home Management : 8-22 mins $Therapeutic Activity: 8-22 mins    Alfred Ann 10/02/2023, 1:13 PM

## 2023-10-02 NOTE — Discharge Instructions (Signed)

## 2023-10-02 NOTE — Plan of Care (Signed)
  Problem: Education: Goal: Knowledge of General Education information will improve Description: Including pain rating scale, medication(s)/side effects and non-pharmacologic comfort measures Outcome: Adequate for Discharge   Problem: Health Behavior/Discharge Planning: Goal: Ability to manage health-related needs will improve Outcome: Adequate for Discharge   Problem: Clinical Measurements: Goal: Ability to maintain clinical measurements within normal limits will improve Outcome: Adequate for Discharge Goal: Will remain free from infection Outcome: Adequate for Discharge Goal: Diagnostic test results will improve Outcome: Adequate for Discharge Goal: Respiratory complications will improve Outcome: Adequate for Discharge Goal: Cardiovascular complication will be avoided Outcome: Adequate for Discharge   Problem: Activity: Goal: Risk for activity intolerance will decrease Outcome: Adequate for Discharge   Problem: Nutrition: Goal: Adequate nutrition will be maintained Outcome: Adequate for Discharge   Problem: Coping: Goal: Level of anxiety will decrease Outcome: Adequate for Discharge   Problem: Elimination: Goal: Will not experience complications related to bowel motility Outcome: Adequate for Discharge Goal: Will not experience complications related to urinary retention Outcome: Adequate for Discharge   Problem: Pain Managment: Goal: General experience of comfort will improve and/or be controlled Outcome: Adequate for Discharge   Problem: Safety: Goal: Ability to remain free from injury will improve Outcome: Adequate for Discharge   Problem: Skin Integrity: Goal: Risk for impaired skin integrity will decrease Outcome: Adequate for Discharge   Problem: Education: Goal: Knowledge of prescribed regimen will improve Outcome: Adequate for Discharge   Problem: Activity: Goal: Ability to tolerate increased activity will improve Outcome: Adequate for Discharge    Problem: Bowel/Gastric: Goal: Gastrointestinal status for postoperative course will improve Outcome: Adequate for Discharge   Problem: Clinical Measurements: Goal: Postoperative complications will be avoided or minimized Outcome: Adequate for Discharge Goal: Signs and symptoms of graft occlusion will improve Outcome: Adequate for Discharge   Problem: Skin Integrity: Goal: Demonstration of wound healing without infection will improve Outcome: Adequate for Discharge   Problem: Acute Rehab PT Goals(only PT should resolve) Goal: Pt Will Go Supine/Side To Sit Outcome: Adequate for Discharge Goal: Patient Will Transfer Sit To/From Stand Outcome: Adequate for Discharge Goal: Pt Will Transfer Bed To Chair/Chair To Bed Outcome: Adequate for Discharge Goal: Pt Will Ambulate Outcome: Adequate for Discharge   Problem: Acute Rehab OT Goals (only OT should resolve) Goal: Pt. Will Perform Lower Body Bathing Outcome: Adequate for Discharge Goal: Pt. Will Perform Lower Body Dressing Outcome: Adequate for Discharge Goal: Pt. Will Transfer To Toilet Outcome: Adequate for Discharge Goal: Pt. Will Perform Toileting-Clothing Manipulation Outcome: Adequate for Discharge Goal: OT Additional ADL Goal #1 Outcome: Adequate for Discharge

## 2023-10-02 NOTE — Care Management Important Message (Signed)
 Important Message  Patient Details  Name: [name] [name] MRN: [id number] Date of Birth: [birth year]   Important Message Given:  Yes - [employer] IM     Janith Melnick 10/02/2023, 10:48 AM

## 2023-10-02 NOTE — Progress Notes (Signed)
 Explained discharge instructions to patient. Reviewed follow up appointment and next medication administration times. Also reviewed education. Patient verbalized having an understanding for instructions given. All belongings are in the patient's possession. Will pick up TOC meds on the way down to the discharge lounge. IV and telemetry were removed. CCMD was notified. No other needs verbalized. Transportation called to transport downstairs to the discharge lounge.

## 2023-10-03 ENCOUNTER — Emergency Department (HOSPITAL_COMMUNITY)
Admission: EM | Admit: 2023-10-03 | Discharge: 2023-10-03 | Disposition: A | Discharge status: Home / Self Care | Attending: Emergency Medicine | Admitting: Emergency Medicine

## 2023-10-03 ENCOUNTER — Telehealth: Payer: Self-pay

## 2023-10-03 ENCOUNTER — Other Ambulatory Visit: Payer: Self-pay

## 2023-10-03 ENCOUNTER — Encounter (HOSPITAL_COMMUNITY): Payer: Self-pay | Admitting: Emergency Medicine

## 2023-10-03 DIAGNOSIS — J4489 Other specified chronic obstructive pulmonary disease: Secondary | ICD-10-CM | POA: Diagnosis not present

## 2023-10-03 DIAGNOSIS — M7989 Other specified soft tissue disorders: Secondary | ICD-10-CM | POA: Diagnosis not present

## 2023-10-03 DIAGNOSIS — K219 Gastro-esophageal reflux disease without esophagitis: Secondary | ICD-10-CM | POA: Diagnosis not present

## 2023-10-03 DIAGNOSIS — E7800 Pure hypercholesterolemia, unspecified: Secondary | ICD-10-CM | POA: Diagnosis not present

## 2023-10-03 DIAGNOSIS — Z7982 Long term (current) use of aspirin: Secondary | ICD-10-CM | POA: Diagnosis not present

## 2023-10-03 DIAGNOSIS — M159 Polyosteoarthritis, unspecified: Secondary | ICD-10-CM | POA: Diagnosis not present

## 2023-10-03 DIAGNOSIS — Z9889 Other specified postprocedural states: Secondary | ICD-10-CM | POA: Diagnosis not present

## 2023-10-03 DIAGNOSIS — I10 Essential (primary) hypertension: Secondary | ICD-10-CM | POA: Diagnosis not present

## 2023-10-03 DIAGNOSIS — I700 Atherosclerosis of aorta: Secondary | ICD-10-CM | POA: Diagnosis not present

## 2023-10-03 DIAGNOSIS — Z48812 Encounter for surgical aftercare following surgery on the circulatory system: Secondary | ICD-10-CM | POA: Diagnosis not present

## 2023-10-03 DIAGNOSIS — I2510 Atherosclerotic heart disease of native coronary artery without angina pectoris: Secondary | ICD-10-CM | POA: Diagnosis not present

## 2023-10-03 DIAGNOSIS — I252 Old myocardial infarction: Secondary | ICD-10-CM | POA: Diagnosis not present

## 2023-10-03 DIAGNOSIS — G8929 Other chronic pain: Secondary | ICD-10-CM | POA: Diagnosis not present

## 2023-10-03 DIAGNOSIS — Z556 Problems related to health literacy: Secondary | ICD-10-CM | POA: Diagnosis not present

## 2023-10-03 DIAGNOSIS — F17210 Nicotine dependence, cigarettes, uncomplicated: Secondary | ICD-10-CM | POA: Diagnosis not present

## 2023-10-03 DIAGNOSIS — E1136 Type 2 diabetes mellitus with diabetic cataract: Secondary | ICD-10-CM | POA: Diagnosis not present

## 2023-10-03 NOTE — ED Triage Notes (Signed)
 BIB [facility], pt from home had Sx Friday, pt had surgey with vascular on rt leg. Pt is visibly upset and crying. Neurovascular checks WNL, pt has 2+ bilat pedal pulses. Pt is c/o pain to rt leg and foot.   Vital en route 140/80, O2 99%, 70 HR, 97.7 temp temporal, 18 resp

## 2023-10-03 NOTE — Discharge Instructions (Addendum)
 Call Dr. Mick Alamin office in the morning to discuss your symptoms, follow-up with him as previously scheduled. Return to the Emergency Department if you experience worsening symptoms, severe pain in your right leg, fevers, shortness of breath, chest pain. Apply ACE wrap to affected leg daily to help with swelling. Continue to follow post-op instructions as advised by Dr. Charlotte Cookey.

## 2023-10-03 NOTE — Progress Notes (Signed)
 CSW spoke with [name] Matas, RN who states patient needs a ride home - CSW agreeable for patient to be discharged home via cab. It appears patient's granddaughter was supposed to pick her up yesterday but never did.  Shepard Dicker, MSW, LCSW Transitions of Care  Clinical Social Worker II [phone]

## 2023-10-03 NOTE — Transitions of Care (Post Inpatient/ED Visit) (Signed)
   10/03/2023  Name: [name] [name] MRN: [id number] DOB: 1946-03-18  Today's TOC FU Call Status: Today's TOC FU Call Status:: Unsuccessful Call (1st Attempt) Unsuccessful Call (1st Attempt) Date: 10/03/23  Attempted to reach the patient regarding the most recent Inpatient/ED visit.  Follow Up Plan: Additional outreach attempts will be made to reach the patient to complete the Transitions of Care (Post Inpatient/ED visit) call.   Tonia Frankel RN, CCM [place]  VBCI-Population Health RN Care Manager [phone]

## 2023-10-03 NOTE — ED Provider Notes (Signed)
 [facility] EMERGENCY DEPARTMENT AT [facility] Provider Note   CSN: [id number] Arrival date & time: 10/03/23  0041     History  No chief complaint on file.   [name] [name] is a 78 y.o. female.  78 year old female presenting with R leg swelling. Patient had femoral endarterectomy of her R leg on 5/2 with Dr. Charlotte Cookey, was discharged from the hospital yesterday afternoon (5/6). Patient went home and was doing well, however she noticed when she was getting out of the bed last night her leg was "very swollen" around her knee/thigh. She reports some mild tenderness in her groin over the site where her surgical drain was placed, but otherwise denies pain. Denies chest pain, shortness of breath, fevers.         Home Medications Prior to Admission medications   Medication Sig Start Date End Date Taking? Authorizing Provider  albuterol  (PROVENTIL ) (2.5 MG/3ML) 0.083% nebulizer solution Inhale 1 vial via nebulizer every 6 hours as needed for wheezing or shortness of breath 08/23/23   Sirivol, Mamatha, MD  albuterol  (VENTOLIN  HFA) 108 (90 Base) MCG/ACT inhaler Inhale 2 puffs by mouth every 6 hours as needed for shortness of breath or wheezing 08/23/23   Sirivol, Mamatha, MD  aspirin  EC 81 MG tablet Take 1 tablet (81 mg total) by mouth daily. Swallow whole. 03/04/21   Tobb, Kardie, DO  budeson-glycopyrrolate -formoterol  (BREZTRI  AEROSPHERE) 160-9-4.8 MCG/ACT AERO INHALE TWO (2) PUFFS BY MOUTH INTO THE LUNGS TWICE DAILY AS NEEDED FOR SHORTNESS OF BREATH 08/20/23   Sirivol, Mamatha, MD  buPROPion  (WELLBUTRIN  XL) 150 MG 24 hr tablet Take 1 tablet by mouth every morning 09/17/23   Sirivol, Mamatha, MD  cloNIDine  (CATAPRES ) 0.1 MG tablet TAKE ONE (1) TABLET BY MOUTH TWICE DAILY 07/31/23   Sirivol, Mamatha, MD  hydrOXYzine  (VISTARIL ) 25 MG capsule Take 1 capsule (25 mg total) by mouth every 8 (eight) hours as needed. Patient not taking: Reported on 09/20/2023 10/11/22   Mercy Stall, MD  isosorbide   mononitrate (IMDUR ) 30 MG 24 hr tablet Take 1 tablet by mouth every day 08/16/23   Sirivol, Mamatha, MD  lisinopril -hydrochlorothiazide  (ZESTORETIC ) 20-12.5 MG tablet TAKE TWO (2) TABLETS BY MOUTH EVERY MORNING 07/31/23   Sirivol, Mamatha, MD  meclizine  (ANTIVERT ) 25 MG tablet TAKE 1 TABLET BY MOUTH THREE TIMES DAILY AS NEEDED FOR DIZZINESS *REFILL REQUEST* 01/27/23   Cox, Burleigh Carp, MD  nitroGLYCERIN  (NITROSTAT ) 0.4 MG SL tablet Place 1 tablet (0.4 mg total) under the tongue every 5 (five) minutes as needed for chest pain. 04/16/23   Sirivol, Alla Ar, MD  omeprazole  (PRILOSEC) 40 MG capsule TAKE 1 CAPSULE BY MOUTH EVERY MORNING 01/30/23   Cox, Burleigh Carp, MD  oxyCODONE -acetaminophen  (PERCOCET) 10-325 MG tablet Take 1 tablet by mouth every 6 (six) hours as needed for moderate pain (pain score 4-6). 12/29/22   [provider]  potassium chloride  (KLOR-CON  M) 10 MEQ tablet TAKE 1 TABLET BY MOUTH ONCE DAILY 07/31/23   Sirivol, Mamatha, MD  promethazine  (PHENERGAN ) 25 MG tablet Take 1 tablet by mouth every 6 hours as needed for nausea and vomiting 09/04/23   Cox, Burleigh Carp, MD  rosuvastatin  (CRESTOR ) 20 MG tablet Take 1 tablet (20 mg total) by mouth at bedtime. 10/02/23   Rhyne, Samantha J, PA-C  traZODone  (DESYREL ) 100 MG tablet TAKE 1 TABLET BY MOUTH DAILY AT BEDTIME 07/31/23   Sirivol, Mamatha, MD  Vitamin D , Ergocalciferol , (DRISDOL ) 1.25 MG (50000 UNIT) CAPS capsule TAKE ONE CAPSULE BY MOUTH ONCE WEEKLY ON MONDAY  Patient not taking: Reported on 09/20/2023 12/18/22   CoxBurleigh Carp, MD      Allergies    Meperidine, Meperidine hcl, Gabapentin , Influenza vaccines, and Other    Review of Systems   Review of Systems  Physical Exam Updated Vital Signs BP (!) 177/69   Pulse 65   Temp 98.4 F (36.9 C) (Oral)   Resp 18   Ht 5\' 2"  (1.575 m)   Wt 53.9 kg   BMI 21.73 kg/m  Physical Exam Vitals and nursing note reviewed.  HENT:     Head: Normocephalic.  Eyes:     Extraocular Movements: Extraocular movements  intact.  Cardiovascular:     Rate and Rhythm: Normal rate and regular rhythm.     Pulses:          Dorsalis pedis pulses are 2+ on the right side and 2+ on the left side.  Pulmonary:     Effort: Pulmonary effort is normal.  Musculoskeletal:     Cervical back: Normal range of motion.     Comments: Patient freely moves both lower extremities without issue, 5/5 strength against resistance. Generalized swelling noted in the popliteal area of the right leg as well as R thigh as compared to opposite leg, see photos.   Skin:    Comments: Surgical site noted in the R groin with bruising/mild erythema, no drainage from the surgical site or overlying warmth  Neurological:     Mental Status: She is alert.        ED Results / Procedures / Treatments   Labs (all labs ordered are listed, but only abnormal results are displayed) Labs Reviewed - No data to display  EKG None  Radiology No results found.  Procedures Procedures    Medications Ordered in ED Medications - No data to display  ED Course/ Medical Decision Making/ A&P                                 Medical Decision Making This patient presents to the ED for concern of right leg swelling, this involves an extensive number of treatment options, and is a complaint that carries with it a high risk of complications and morbidity.  The differential diagnosis includes post-operative state, dependent edema, DVT, effusion, wound infection.   Co morbidities that complicate the patient evaluation  Recent femoral endarterectomy 09/28/23   Additional history obtained:  Additional history obtained from chart review External records from outside source obtained and reviewed including operative report and notes from vascular surgeon  Cardiac Monitoring: / EKG:  The patient was maintained on a cardiac monitor.  I personally viewed and interpreted the cardiac monitored which showed an underlying rhythm of: normal sinus rhythm     Consultations Obtained:  I requested consultation with the vascular surgeon on call, Dr. Susi Eric - they recommend: upon review of photos of patient's legs, he is in agreement that this is likely normal post-op swelling to be expected following her recent vascular surgery. He recommends applying ACE wrap to the affected extremity, have patient follow-up with her surgeon Dr. Charlotte Cookey as previously scheduled.   Test / Admission - Considered:  Physical exam is notable for swelling of the right lower extremity affecting the popliteal area and the thigh. 2+ DP noted in both lower extremities, no pain with active range of motion, mild tenderness overlying surgical site of the R groin, patient denies shortness of breath/chest pain/fevers since her surgery.  Spoke with vascular surgeon on call, see above. Given patient's reassuring physical exam findings I decided that further imaging studies were not warranted, that her presentation is likely most consistent with her normal post-operative course. Spoke in depth with patient that swelling is an expected part of the post-operative course, that she can apply ACE wrap to the leg to help reduce swelling. Continue to follow post-operative restrictions as previously directed by Dr. Charlotte Cookey. Patient voices understanding and is agreeable with this plan. Return precautions discussed. ACE wrap applied by nursing staff prior to discharge.  Staffed with Dr. Morris Arch.  Amount and/or Complexity of Data Reviewed External Data Reviewed: notes.    Details: As above.           Final Clinical Impression(s) / ED Diagnoses Final diagnoses:  Post-operative state  Right leg swelling    Rx / DC Orders ED Discharge Orders     None         Adolm Ahumada 10/03/23 0259    Lindle Rhea, MD 10/03/23 [phone]

## 2023-10-04 ENCOUNTER — Telehealth: Payer: Self-pay

## 2023-10-04 DIAGNOSIS — I70221 Atherosclerosis of native arteries of extremities with rest pain, right leg: Secondary | ICD-10-CM | POA: Insufficient documentation

## 2023-10-04 HISTORY — DX: Atherosclerosis of native arteries of extremities with rest pain, right leg: I70.221

## 2023-10-04 NOTE — Transitions of Care (Post Inpatient/ED Visit) (Signed)
 10/04/2023  Name: [name] [name] MRN: [id number] DOB: 09/20/1945  Today's TOC FU Call Status: Today's TOC FU Call Status:: Successful TOC FU Call Completed TOC FU Call Complete Date: 10/04/23 Patient's Name and Date of Birth confirmed.  Transition Care Management Follow-up Telephone Call How have you been since you were released from the hospital?: Better Any questions or concerns?: Yes Patient Questions/Concerns:: Patient states she hasn't read the discharge instructions and does not understand what to do or when to go to the hospital Patient Questions/Concerns Addressed: Other: [facility] RN reviewed discharge instructions with patient)  Items Reviewed: Did you receive and understand the discharge instructions provided?: Yes (Reviewed discharge instructions with patient today 10/04/23) Medications obtained,verified, and reconciled?: Yes (Medications Reviewed) Any new allergies since your discharge?: No Dietary orders reviewed?: Yes Type of Diet Ordered:: Patient states low sodium diet Do you have support at home?: Yes People in Home [RPT]: alone Name of Support/Comfort Primary Source: Patient states she lives alone but granddaughter and great grandchildren - states 2 great children are going to stay with her this weekend  Medications Reviewed Today: Medications Reviewed Today     Reviewed by Sharmaine Dearth, RN (Registered Nurse) on 10/04/23 at [phone]  Med List Status: <None>   Medication Order Taking? Sig Documenting Provider Last Dose Status Informant  albuterol  (PROVENTIL ) (2.5 MG/3ML) 0.083% nebulizer solution [id number] Yes Inhale 1 vial via nebulizer every 6 hours as needed for wheezing or shortness of breath Sirivol, Alla Ar, MD Taking Active Self  albuterol  (VENTOLIN  HFA) 108 (90 Base) MCG/ACT inhaler [id number] Yes Inhale 2 puffs by mouth every 6 hours as needed for shortness of breath or wheezing Sirivol, Mamatha, MD Taking Active Self  aspirin  EC 81 MG tablet [id number] Yes Take 1  tablet (81 mg total) by mouth daily. Swallow whole. Tobb, Kardie, DO Taking Active Self  budeson-glycopyrrolate -formoterol  (BREZTRI  AEROSPHERE) 160-9-4.8 MCG/ACT AERO [id number] Yes INHALE TWO (2) PUFFS BY MOUTH INTO THE LUNGS TWICE DAILY AS NEEDED FOR SHORTNESS OF BREATH Sirivol, Mamatha, MD Taking Active Self  buPROPion  (WELLBUTRIN  XL) 150 MG 24 hr tablet [id number] Yes Take 1 tablet by mouth every morning Sirivol, Alla Ar, MD Taking Active Self  cloNIDine  (CATAPRES ) 0.1 MG tablet [id number] Yes TAKE ONE (1) TABLET BY MOUTH TWICE DAILY Sirivol, Mamatha, MD Taking Active Self  hydrOXYzine  (VISTARIL ) 25 MG capsule [id number] No Take 1 capsule (25 mg total) by mouth every 8 (eight) hours as needed.  Patient not taking: Reported on 09/20/2023   CoxBurleigh Carp, MD Not Taking Active Self  isosorbide  mononitrate (IMDUR ) 30 MG 24 hr tablet [id number] Yes Take 1 tablet by mouth every day Sirivol, Mamatha, MD Taking Active Self  lisinopril -hydrochlorothiazide  (ZESTORETIC ) 20-12.5 MG tablet [id number] No TAKE TWO (2) TABLETS BY MOUTH EVERY MORNING  Patient not taking: Reported on 10/04/2023   Sirivol, Mamatha, MD Not Taking Active Self  meclizine  (ANTIVERT ) 25 MG tablet [id number] Yes TAKE 1 TABLET BY MOUTH THREE TIMES DAILY AS NEEDED FOR DIZZINESS *REFILL REQUEST* Cox, Kirsten, MD Taking Active Self  nitroGLYCERIN  (NITROSTAT ) 0.4 MG SL tablet [id number] Yes Place 1 tablet (0.4 mg total) under the tongue every 5 (five) minutes as needed for chest pain. Sirivol, Mamatha, MD Taking Active Self  omeprazole  (PRILOSEC) 40 MG capsule [id number] Yes TAKE 1 CAPSULE BY MOUTH EVERY MORNING Cox, Kirsten, MD Taking Active Self  oxyCODONE -acetaminophen  (PERCOCET) 10-325 MG tablet [id number] Yes Take 1 tablet by mouth every 6 (six) hours as needed for moderate pain (pain score 4-6). [provider] Taking  Active Self  potassium chloride  (KLOR-CON  M) 10 MEQ tablet [id number] Yes TAKE 1 TABLET BY MOUTH ONCE DAILY Sirivol, Mamatha,  MD Taking Active Self  promethazine  (PHENERGAN ) 25 MG tablet [id number] Yes Take 1 tablet by mouth every 6 hours as needed for nausea and vomiting Cox, Kirsten, MD Taking Active Self  rosuvastatin  (CRESTOR ) 20 MG tablet [id number] Yes Take 1 tablet (20 mg total) by mouth at bedtime. Bonnye Butts, PA-C Taking Active   traZODone  (DESYREL ) 100 MG tablet [id number] Yes TAKE 1 TABLET BY MOUTH DAILY AT BEDTIME Sirivol, Alla Ar, MD Taking Active Self  Vitamin D , Ergocalciferol , (DRISDOL ) 1.25 MG (50000 UNIT) CAPS capsule [id number] No TAKE ONE CAPSULE BY MOUTH ONCE WEEKLY ON MONDAY  Patient not taking: No sig reported   Cox, Burleigh Carp, MD Not Taking Active Self            Home Care and Equipment/Supplies: Were Home Health Services Ordered?: Yes Name of Home Health Agency:: [facility] Has Agency set up a time to come to your home?: Yes First Home Health Visit Date: 10/03/23 Any new equipment or medical supplies ordered?: No  Functional Questionnaire: Do you need assistance with bathing/showering or dressing?: No (Patient states she is independent and likes to do things for herself but states she will shower when great grandchildren are there - will sponge bath until then) Do you need assistance with meal preparation?: No Do you need assistance with eating?: No Do you have difficulty maintaining continence: Yes (patient reports wearing pads - occasional bladder incontininence) Do you need assistance with getting out of bed/getting out of a chair/moving?: No Do you have difficulty managing or taking your medications?: No  Follow up appointments reviewed: PCP Follow-up appointment confirmed?: Yes Date of PCP follow-up appointment?: 10/11/23 (scheduled by Care guide) Follow-up Provider: Sirivol, Mamatha, MD Specialist Hospital Follow-up appointment confirmed?: Yes Date of Specialist follow-up appointment?: 11/05/23 Follow-Up Specialty Provider:: surgeon, Dr Charlotte Cookey Do you need transportation to  your follow-up appointment?: No (patient has transprtation services from [facility] to MD appointments) Do you understand care options if your condition(s) worsen?: Yes-patient verbalized understanding  SDOH Interventions Today    Flowsheet Row Most Recent Value  SDOH Interventions   Food Insecurity Interventions Intervention Not Indicated  Housing Interventions Intervention Not Indicated  [Patient states she lives in public assistance housing without issue]  Transportation Interventions Intervention Not Indicated  [patient states [employer] gets her to MD appointments and granddaughter takes her shopping]  Utilities Interventions Intervention Not Indicated  [patient states [employer] helps as needed]       Goals Addressed             This Visit's Progress    VBCI Transitions of Care (TOC) Care Plan       Problems:  Recent Hospitalization for treatment of Atherosclerosis of native artery of right lower extremity with rest pain, PAD -  femoral endarterectomy of her R leg on 5/2 with Dr. Kimball Pence Follow Up Provider appointment patient had not scheduled and allowed Care guide to schedule and this problem was resolved during Initial Call - Scheduled 5/15 with PCP Patient returned to ED 10/03/23 and reports being very emotional due to fear of swelling of leg - encouraged patient to reach out to [facility] RN or surgeon's office as needed with concerns Patient has smoked for 50 years and states she quit 1 week ago - reviewed information from After Visit Summary regarding smoking cessation   Goal:  Over the next 30 days, the  patient will not experience hospital readmission  Interventions:  Transitions of Care: Doctor Visits  - discussed the importance of doctor visits Arranged PCP follow-up within 7 days (Care Guide Scheduled) Post discharge activity limitations prescribed by provider reviewed Post-op wound/incision care reviewed with patient/caregiver Reviewed Signs and symptoms of infection TOC  reviewed all discharge instructions with patient looking at the After Visit Summary while patient looked at it as well   Patient Self Care Activities:  Attend all scheduled provider appointments Call pharmacy for medication refills 3-7 days in advance of running out of medications Call provider office for new concerns or questions  Notify RN Care Manager of [facility] call rescheduling needs Participate in Transition of Care Program/Attend TOC scheduled calls Take medications as prescribed    Plan:  Next PCP appointment scheduled for: 10/11/23 2:40 Telephone follow up appointment with care management team member scheduled for:  10/09/23 3pm The patient has been provided with contact information for the care management team and has been advised to call with any health related questions or concerns.          Tonia Frankel RN, CCM [place]  VBCI-Population Health RN Care Manager [phone]

## 2023-10-04 NOTE — Telephone Encounter (Addendum)
*  This note was written by Espiridion Heft, RN.    Patient called c/o tingling in her right leg.  Patient was transported to the ED on 10/03/23 complaining of right leg pain. See ED Note.  Triage nurse returned pt's call on 10/04/23 to check to see how the pt's leg was feeling.   Pt reported tingling but said her right leg was feeling much better. Nurse explained to pt that reperfusion pain can happen after revascularization. Pt advised to walk and stay active. Pt advised to return to the ED if her leg becomes cold, numb, or has increased pain.

## 2023-10-05 NOTE — Telephone Encounter (Signed)
g

## 2023-10-05 NOTE — Anesthesia Postprocedure Evaluation (Signed)
 Anesthesia Post Note  Patient: [name] [name]  Procedure(s) Performed: ENDARTERECTOMY, FEMORAL (Right: Groin) RIGHT FEMORAL ANGIOPLASTY, USING GREATER SAPHENOUS VEIN (Right: Groin) SURGICAL PROCUREMENT, RIGHT GREATER SAPHENOUS VEIN (Right: Groin)     Patient location during evaluation: PACU Anesthesia Type: General Level of consciousness: awake and alert Pain management: pain level controlled Vital Signs Assessment: post-procedure vital signs reviewed and stable Respiratory status: spontaneous breathing, nonlabored ventilation and respiratory function stable Cardiovascular status: blood pressure returned to baseline and stable Postop Assessment: no apparent nausea or vomiting Anesthetic complications: no   No notable events documented.                Deija Buhrman

## 2023-10-08 DIAGNOSIS — I2510 Atherosclerotic heart disease of native coronary artery without angina pectoris: Secondary | ICD-10-CM | POA: Diagnosis not present

## 2023-10-08 DIAGNOSIS — I10 Essential (primary) hypertension: Secondary | ICD-10-CM | POA: Diagnosis not present

## 2023-10-08 DIAGNOSIS — I252 Old myocardial infarction: Secondary | ICD-10-CM | POA: Diagnosis not present

## 2023-10-08 DIAGNOSIS — E7800 Pure hypercholesterolemia, unspecified: Secondary | ICD-10-CM | POA: Diagnosis not present

## 2023-10-08 DIAGNOSIS — K219 Gastro-esophageal reflux disease without esophagitis: Secondary | ICD-10-CM | POA: Diagnosis not present

## 2023-10-08 DIAGNOSIS — M159 Polyosteoarthritis, unspecified: Secondary | ICD-10-CM | POA: Diagnosis not present

## 2023-10-08 DIAGNOSIS — I700 Atherosclerosis of aorta: Secondary | ICD-10-CM | POA: Diagnosis not present

## 2023-10-08 DIAGNOSIS — Z556 Problems related to health literacy: Secondary | ICD-10-CM | POA: Diagnosis not present

## 2023-10-08 DIAGNOSIS — J4489 Other specified chronic obstructive pulmonary disease: Secondary | ICD-10-CM | POA: Diagnosis not present

## 2023-10-08 DIAGNOSIS — G8929 Other chronic pain: Secondary | ICD-10-CM | POA: Diagnosis not present

## 2023-10-08 DIAGNOSIS — F17210 Nicotine dependence, cigarettes, uncomplicated: Secondary | ICD-10-CM | POA: Diagnosis not present

## 2023-10-08 DIAGNOSIS — E1136 Type 2 diabetes mellitus with diabetic cataract: Secondary | ICD-10-CM | POA: Diagnosis not present

## 2023-10-08 DIAGNOSIS — Z48812 Encounter for surgical aftercare following surgery on the circulatory system: Secondary | ICD-10-CM | POA: Diagnosis not present

## 2023-10-08 DIAGNOSIS — Z7982 Long term (current) use of aspirin: Secondary | ICD-10-CM | POA: Diagnosis not present

## 2023-10-09 ENCOUNTER — Other Ambulatory Visit: Payer: Self-pay | Admitting: Licensed Clinical Social Worker

## 2023-10-09 ENCOUNTER — Other Ambulatory Visit: Payer: Self-pay

## 2023-10-09 NOTE — Transitions of Care (Post Inpatient/ED Visit) (Signed)
 Transition of Care week 2  Visit Note  10/09/2023  Name: [name] [name] MRN: [id number]          DOB: Aug 11, 1945  Situation: Patient enrolled in [facility] 30-day program. Visit completed with patient by telephone.   Background: Patient being followed by [facility] RN related to hospitalization 09/28/23 - 10/02/23 Atherosclerosis of native artery of right lower extremity with rest pain, PAD - femoral endarterectomy of her R leg on 5/2 with Dr. Charlotte Cookey. Patient returned to ED 10/03/23 due to right leg swelling.Today patient reports swelling "is going down some". Patient reports she had to cancel her pain management appointment because of her hospitalization and states she has been seeing them for chronic back pain and she's not sure she wants to continue because she called them yesterday to see if they'd giver her new Rx and states she was told she needs to reschedule. Patient states she continues to refrain from smoking. Patient states she had a good conversation this morning with SW, Myrtie Atkinson and has follow up call scheduled 11/09/23  Initial Transition Care Management Follow-up Telephone Call    Past Medical History:  Diagnosis Date   Anxiety    Aortic atherosclerosis (HCC)    Ascending aorta dilation (HCC)    a.) TTE 09/02/19 --> mild; measured 39mm.   Asthma    BMI 24.0-24.9, adult 02/14/2022   CAD (coronary artery disease)    a,) CTA 08/24/20 --> mild CAD (25-49%) in RCA, LAD, LCx; CAC/Agatston score 509.   Cataract    Chronic left shoulder pain 09/27/2015   COPD (chronic obstructive pulmonary disease) (HCC)    Coronary artery disease involving native coronary artery of native heart without angina pectoris 11/18/2020   Degenerative joint disease involving multiple joints 09/28/2017   Depression    Depression, major, single episode, severe (HCC) 03/30/2020   Diastolic dysfunction    a.) TTE 09/02/19 --> LVEF 60-65%; G2DD. b.) TTE 07/23/20 --> LVEF 70-75%; G1DD.   Enlarged aorta (HCC)    Essential  hypertension 08/07/2019   GERD (gastroesophageal reflux disease)    GERD with esophagitis 08/07/2019   History of kidney stones    Hyperlipidemia    Hypertension    Long-term current use of opiate analgesic 06/25/2017   Lupus    Myocardial infarction (HCC)    Osteoarthritis    PAC (premature atrial contraction)    a.) Holter 12/04/19 --> occassional; 1.1% PAC burden.   Pain of right hip joint 11/22/2015   Paraesophageal hernia    PAT (paroxysmal atrial tachycardia) (HCC) 02/03/2020   Presbyesophagus    Primary osteoarthritis of left knee 02/04/2015   PSVT (paroxysmal supraventricular tachycardia) (HCC)    a.) Holter 12/04/19 --> 22 runs.   Renal calculus 12/21/2021   S/P repair of paraesophageal hernia 02/22/2021   S/P total knee arthroplasty 02/15/2015   Schatzki's ring    Sciatica 01/31/2022   Tobacco use 08/19/2019   Type 2 diabetes mellitus with other specified complication (HCC) 08/07/2019    Assessment: Patient Reported Symptoms: Assessments reviewed this morning by SW. Not repeated.   There were no vitals filed for this visit.  Medications Reviewed Today     Reviewed by Sharmaine Dearth, RN (Registered Nurse) on 10/09/23 at 1511  Med List Status: <None>   Medication Order Taking? Sig Documenting Provider Last Dose Status Informant  albuterol  (PROVENTIL ) (2.5 MG/3ML) 0.083% nebulizer solution [id number] Yes Inhale 1 vial via nebulizer every 6 hours as needed for wheezing or shortness of breath Sirivol, Mamatha,  MD Taking Active Self  albuterol  (VENTOLIN  HFA) 108 (90 Base) MCG/ACT inhaler [id number] Yes Inhale 2 puffs by mouth every 6 hours as needed for shortness of breath or wheezing Sirivol, Mamatha, MD Taking Active Self  aspirin  EC 81 MG tablet [id number] Yes Take 1 tablet (81 mg total) by mouth daily. Swallow whole. Tobb, Kardie, DO Taking Active Self  budeson-glycopyrrolate -formoterol  (BREZTRI  AEROSPHERE) 160-9-4.8 MCG/ACT AERO [id number] Yes INHALE TWO (2) PUFFS BY MOUTH INTO  THE LUNGS TWICE DAILY AS NEEDED FOR SHORTNESS OF BREATH Sirivol, Mamatha, MD Taking Active Self  buPROPion  (WELLBUTRIN  XL) 150 MG 24 hr tablet [id number] Yes Take 1 tablet by mouth every morning Sirivol, Alla Ar, MD Taking Active Self  cloNIDine  (CATAPRES ) 0.1 MG tablet [id number] Yes TAKE ONE (1) TABLET BY MOUTH TWICE DAILY Sirivol, Mamatha, MD Taking Active Self  hydrOXYzine  (VISTARIL ) 25 MG capsule [id number] No Take 1 capsule (25 mg total) by mouth every 8 (eight) hours as needed.  Patient not taking: Reported on 09/20/2023   CoxBurleigh Carp, MD Not Taking Active Self  isosorbide  mononitrate (IMDUR ) 30 MG 24 hr tablet [id number] Yes Take 1 tablet by mouth every day Sirivol, Mamatha, MD Taking Active Self  lisinopril -hydrochlorothiazide  (ZESTORETIC ) 20-12.5 MG tablet [id number] No TAKE TWO (2) TABLETS BY MOUTH EVERY MORNING  Patient not taking: No sig reported   Sirivol, Mamatha, MD Not Taking Active Self  meclizine  (ANTIVERT ) 25 MG tablet [id number] Yes TAKE 1 TABLET BY MOUTH THREE TIMES DAILY AS NEEDED FOR DIZZINESS *REFILL REQUEST* Cox, Kirsten, MD Taking Active Self  nitroGLYCERIN  (NITROSTAT ) 0.4 MG SL tablet [id number] Yes Place 1 tablet (0.4 mg total) under the tongue every 5 (five) minutes as needed for chest pain. Sirivol, Mamatha, MD Taking Active Self  omeprazole  (PRILOSEC) 40 MG capsule [id number] Yes TAKE 1 CAPSULE BY MOUTH EVERY MORNING Cox, Kirsten, MD Taking Active Self  oxyCODONE -acetaminophen  (PERCOCET) 10-325 MG tablet [id number] No Take 1 tablet by mouth every 6 (six) hours as needed for moderate pain (pain score 4-6).  Patient not taking: Reported on 10/09/2023   [provider] Not Taking Active Self           Med Note Bernadene Brewer, [place] A   Tue Oct 09, 2023  3:10 PM) Patient states she is out of this medication  potassium chloride  (KLOR-CON  M) 10 MEQ tablet [id number] No TAKE 1 TABLET BY MOUTH ONCE DAILY  Patient not taking: Reported on 10/09/2023   Sirivol, Mamatha, MD Not Taking  Active Self  promethazine  (PHENERGAN ) 25 MG tablet [id number] Yes Take 1 tablet by mouth every 6 hours as needed for nausea and vomiting Cox, Kirsten, MD Taking Active Self  rosuvastatin  (CRESTOR ) 20 MG tablet [id number] Yes Take 1 tablet (20 mg total) by mouth at bedtime. Bonnye Butts, PA-C Taking Active   traZODone  (DESYREL ) 100 MG tablet [id number] Yes TAKE 1 TABLET BY MOUTH DAILY AT BEDTIME Sirivol, Mamatha, MD Taking Active Self  Vitamin D , Ergocalciferol , (DRISDOL ) 1.25 MG (50000 UNIT) CAPS capsule [id number] No TAKE ONE CAPSULE BY MOUTH ONCE WEEKLY ON MONDAY  Patient not taking: No sig reported   Mercy Stall, MD Not Taking Active Self            Recommendation:   PCP Follow-up scheduled 10/11/23  Follow Up Plan:   Telephone follow up appointment date/time:  10/17/23 3pm  Tonia Frankel RN, CCM [place]  VBCI-Population Health RN Care Manager [phone]

## 2023-10-09 NOTE — Patient Outreach (Signed)
 Complex Care Management   Visit Note  10/09/2023  Name:  [name] [name] MRN: [id number] DOB: Jul 06, 1945  Situation: Referral received for Complex Care Management related to [facility]/Behavioral Health diagnosis MDD I obtained verbal consent from Patient.  Visit completed with patient  on the phone  Background:   Past Medical History:  Diagnosis Date   Anxiety    Aortic atherosclerosis (HCC)    Ascending aorta dilation (HCC)    a.) TTE 09/02/19 --> mild; measured 39mm.   Asthma    BMI 24.0-24.9, adult 02/14/2022   CAD (coronary artery disease)    a,) CTA 08/24/20 --> mild CAD (25-49%) in RCA, LAD, LCx; CAC/Agatston score 509.   Cataract    Chronic left shoulder pain 09/27/2015   COPD (chronic obstructive pulmonary disease) (HCC)    Coronary artery disease involving native coronary artery of native heart without angina pectoris 11/18/2020   Degenerative joint disease involving multiple joints 09/28/2017   Depression    Depression, major, single episode, severe (HCC) 03/30/2020   Diastolic dysfunction    a.) TTE 09/02/19 --> LVEF 60-65%; G2DD. b.) TTE 07/23/20 --> LVEF 70-75%; G1DD.   Enlarged aorta (HCC)    Essential hypertension 08/07/2019   GERD (gastroesophageal reflux disease)    GERD with esophagitis 08/07/2019   History of kidney stones    Hyperlipidemia    Hypertension    Long-term current use of opiate analgesic 06/25/2017   Lupus    Myocardial infarction (HCC)    Osteoarthritis    PAC (premature atrial contraction)    a.) Holter 12/04/19 --> occassional; 1.1% PAC burden.   Pain of right hip joint 11/22/2015   Paraesophageal hernia    PAT (paroxysmal atrial tachycardia) (HCC) 02/03/2020   Presbyesophagus    Primary osteoarthritis of left knee 02/04/2015   PSVT (paroxysmal supraventricular tachycardia) (HCC)    a.) Holter 12/04/19 --> 22 runs.   Renal calculus 12/21/2021   S/P repair of paraesophageal hernia 02/22/2021   S/P total knee arthroplasty 02/15/2015   Schatzki's ring     Sciatica 01/31/2022   Tobacco use 08/19/2019   Type 2 diabetes mellitus with other specified complication (HCC) 08/07/2019    Assessment: Patient Reported Symptoms:  Cognitive Cognitive Status: Alert and oriented to person, place, and time, Insightful and able to interpret abstract concepts, Normal speech and language skills Cognitive/Intellectual Conditions Management [RPT]: None reported or documented in medical history or problem list   Health Maintenance Behaviors: Stress management, Annual physical exam Healing Pattern: Unsure Health Facilitated by: Pain control, Stress management  Neurological Neurological Review of Symptoms: No symptoms reported Neurological Management Strategies: Activity, Coping strategies  HEENT HEENT Symptoms Reported: No symptoms reported      Cardiovascular Cardiovascular Symptoms Reported: Swelling in legs or feet Cardiovascular Conditions: Coronary artery disease, Hypertension Cardiovascular Management Strategies: Activity, Coping strategies, Medication therapy Cardiovascular Self-Management Outcome: 3 (uncertain) Cardiovascular Comment: Working to stay active and has quite smoking  Respiratory Respiratory Symptoms Reported: No symptoms reported Other Respiratory Symptoms: Uses inhaler when doing PT/OT - can become short of breath easily Respiratory Conditions: COPD Respiratory Self-Management Outcome: 4 (good) Respiratory Comment: Quit smoking for 2 weeks  Endocrine Patient reports the following symptoms related to hypoglycemia or hyperglycemia : No symptoms reported Is patient diabetic?: No    Gastrointestinal Gastrointestinal Symptoms Reported: No symptoms reported Gastrointestinal Conditions: Constipation    Genitourinary Genitourinary Symptoms Reported: Incontinence Additional Genitourinary Details: Occasional incontinence Genitourinary Conditions: Incontinence  Integumentary Integumentary Symptoms Reported: No symptoms reported  Musculoskeletal Musculoskelatal Symptoms Reviewed: Difficulty walking, Unsteady gait Additional Musculoskeletal Details: Right leg swelling Musculoskeletal Conditions: Mobility limited Musculoskeletal Management Strategies: Activity, Coping strategies, Exercise, Medication therapy Musculoskeletal Self-Management Outcome: 4 (good)      Psychosocial Psychosocial Symptoms Reported: Sadness - if selected complete PHQ 2-9, Anxiety - if selected complete GAD Additional Psychological Details: Reports higher anxiety after quiting smoking - has not smoked in 2 weeks. Reviewed her coping skills and family support. Will sned message to provider regarding med interventions. Behavioral Health Conditions: Depression, Anxiety Behavioral Management Strategies: Counseling, Medication therapy, Support system, Coping strategies, Abstinence from substances Behavioral Health Self-Management Outcome: 4 (good) Major Change/Loss/Stressor/Fears (CP): Medical condition, self Techniques to Cope with Loss/Stress/Change: Diversional activities, Spiritual practice(s), Exercise Quality of Family Relationships: helpful, involved, supportive Do you feel physically threatened by others?: No      10/09/2023    9:55 AM  Depression screen PHQ 2/9  Decreased Interest 0  Down, Depressed, Hopeless 1  PHQ - 2 Score 1    There were no vitals filed for this visit.  Medications Reviewed Today   Medications were not reviewed in this encounter     Recommendation:   Discuss medication management for ciagrettte craving and anxiety with provider at next appt. Monitor for changes in mood and utilize coping skills as needed.  Follow Up Plan:   Telephone follow up appointment date/time:  11/09/2023  Hale Level, LCSW [place] Licensed Clinical Social Worker Care Coordinator [phone]

## 2023-10-10 ENCOUNTER — Telehealth: Payer: Self-pay

## 2023-10-10 NOTE — Telephone Encounter (Signed)
 Advice: -pt called, left tearful message stating she could not take the pain anymore and needed something. -returned call to pt who was calm w/o evidence of crying or being upset.  She reported that she did miss her appt with her pain doctor and was having to get a new one because that one was leaving so her next appt is in next month.  She reports elevating her leg, wrapping it with the ACE wrap as discussed when she went to the ED but admits she takes that off at times during the day.   She reports swelling is worse than in the morning but she is up walking around so its understandable.  She is boastful and excited about quitting smoking- states she will never touch another one again.  She is keeping incision clean, dry, denies any limb discoloration, cool/cold foot, fever, or chills.  Admits the swelling is better & is exercising her leg and foot and walking frequently. Reassured pt she is doing everything she can and to keep it up.

## 2023-10-11 ENCOUNTER — Ambulatory Visit

## 2023-10-11 VITALS — BP 100/60 | HR 60 | Temp 98.0°F | Ht 62.0 in | Wt 118.8 lb

## 2023-10-11 DIAGNOSIS — Z720 Tobacco use: Secondary | ICD-10-CM

## 2023-10-11 DIAGNOSIS — Z09 Encounter for follow-up examination after completed treatment for conditions other than malignant neoplasm: Secondary | ICD-10-CM | POA: Diagnosis not present

## 2023-10-11 DIAGNOSIS — I70221 Atherosclerosis of native arteries of extremities with rest pain, right leg: Secondary | ICD-10-CM

## 2023-10-11 DIAGNOSIS — E1159 Type 2 diabetes mellitus with other circulatory complications: Secondary | ICD-10-CM | POA: Diagnosis not present

## 2023-10-11 DIAGNOSIS — I152 Hypertension secondary to endocrine disorders: Secondary | ICD-10-CM | POA: Diagnosis not present

## 2023-10-11 DIAGNOSIS — I739 Peripheral vascular disease, unspecified: Secondary | ICD-10-CM

## 2023-10-11 HISTORY — DX: Encounter for follow-up examination after completed treatment for conditions other than malignant neoplasm: Z09

## 2023-10-11 MED ORDER — IBUPROFEN 800 MG PO TABS: 800.0000 mg | ORAL_TABLET | Freq: Three times a day (TID) | ORAL | 1 refills | Status: AC | PRN

## 2023-10-11 NOTE — Patient Instructions (Signed)
  VISIT SUMMARY: Today, we discussed your postoperative pain and swelling in your right leg following your recent surgery. We also reviewed your blood pressure management, smoking cessation progress, appetite issues, and toenail fungus.  YOUR PLAN: PERIPHERAL ARTERY DISEASE WITH POSTOPERATIVE SWELLING: You have swelling and pain in your right leg after your recent surgery. The swelling is likely due to the surgery and not a blood clot. -Continue to elevate your right leg and wrap it with an ACE bandage. -Watch for signs of infection or worsening symptoms. -Follow up with your vascular specialists. -Take ibuprofen  800 mg as needed for pain, but be careful not to overuse it. -Keep your leg elevated when resting.  HYPERTENSION, UNSPECIFIED: Your blood pressure was low during your hospital stay, so we have paused your blood pressure medication for now. -Continue to hold off on taking lisinopril  hydrochlorothiazide . -Monitor your blood pressure regularly.  NICOTINE  DEPENDENCE, IN REMISSION: You have successfully quit smoking for two weeks and one day after smoking for 50 years. This is very important for your health. -Keep up the great work staying smoke-free. -Avoid places where people are smoking.  POOR APPETITE: You have a poor appetite, but there is no significant weight loss or nutritional deficiencies at this time. -Monitor your nutritional intake and weight. -Try to eat small, frequent meals if your appetite does not improve.  TOENAIL FUNGUS: You have a severe toenail fungus on all your toenails. This is not an immediate concern but will need attention later. -We will address the toenail fungus at a later date.    Contains text generated by [employer]bridge.    Contains text generated by Abridge.

## 2023-10-11 NOTE — Progress Notes (Signed)
 Subjective:  Patient ID: [name] [name], female    DOB: 07/20/1945  Age: 78 y.o. MRN: [id number]  Chief Complaint  Patient presents with   Hospitalization Follow-up    HPI: Discussed the use of AI scribe software for clinical note transcription with the patient, who gave verbal consent to proceed.  History of Present Illness   [name] [name] is a 78 year old female with peripheral arterial disease who presents for hospital discharge follow up and presents with postoperative pain and swelling in her right leg.  She underwent a right iliofemoral endarterectomy with greater saphenous vein patch angioplasty and superficial femoral artery thrombectomy on Sep 28, 2023, due to atherosclerosis of the native artery of the right lower extremity with rest pain and peripheral arterial disease. She was discharged on Oct 02, 2023, but returned to the emergency room on Oct 03, 2023, due to significant swelling and pain in her right leg. The swelling was attributed to postoperative changes. She continues to experience pain and swelling, particularly around the knee and thigh, which impairs her mobility and confines her to her home.  She has a history of smoking for fifty years, which she has recently quit, stating 'smoking cigarettes done this to me.' She has been smoke-free for two weeks and one day and is determined to avoid smoking environments.  Her current medications include Crestor , which was increased from 10 mg to 20 mg daily. She has been switched from Plavix  to aspirin  and takes ibuprofen  800 mg as needed for pain, primarily at night. Lisinopril  hydrochlorothiazide  has been paused.  She reports a poor appetite and difficulty sleeping due to pain. She experiences severe pain in her right foot, making it difficult to walk, and notes swelling in her stomach and bruising on her left lower abdomen and left lateral thigh. She has not driven in twelve years due to poor reflexes and heart issues.  Her blood  pressure was low during her hospital stay, and she notes her blood pressure remains low or 'really good' today.         10/09/2023    9:55 AM 08/14/2023   10:59 AM 08/01/2022    3:48 PM 06/26/2022   11:09 AM 02/14/2022    2:55 PM  Depression screen PHQ 2/9  Decreased Interest 0 0 0 0 1  Down, Depressed, Hopeless 1 0 1 1 2   PHQ - 2 Score 1 0 1 1 3   Altered sleeping  3 3 3 2   Tired, decreased energy  3 3 3 3   Change in appetite  2 2 2 3   Feeling bad or failure about yourself   0 0 0 2  Trouble concentrating  3 2 2 2   Moving slowly or fidgety/restless  0 1 1 2   Suicidal thoughts  0 0 0 0  PHQ-9 Score  11 12 12 17   Difficult doing work/chores  Not difficult at all Not difficult at all Not difficult at all Somewhat difficult        08/14/2023    2:05 PM  Fall Risk   Number falls in past yr: 1  Injury with Fall? 0  Risk for fall due to : History of fall(s);Impaired balance/gait    Patient Care Team: Alaiza Yau, MD as PCP - General (Family Medicine) Tobb, Kardie, DO as PCP - Cardiology (Cardiology) Jens Molder, LCSW as Social Worker (Licensed Clinical Social Worker) Bernadene Brewer, Rafael Bun, RN as Registered Nurse   Review of Systems  Constitutional:  Positive  for appetite change. Negative for chills, fatigue and fever.  HENT:  Negative for congestion, ear pain, sinus pressure and sore throat.   Respiratory:  Negative for cough and shortness of breath.   Cardiovascular:  Negative for chest pain.  Gastrointestinal:  Negative for abdominal pain, constipation, diarrhea, nausea and vomiting.  Genitourinary:  Negative for dysuria and frequency.  Musculoskeletal:  Negative for arthralgias, back pain and myalgias.       Right foot and leg pain  Neurological:  Negative for dizziness and headaches.  Psychiatric/Behavioral:  Positive for sleep disturbance. Negative for dysphoric mood. The patient is not nervous/anxious.     Current Outpatient Medications on File Prior to Visit   Medication Sig Dispense Refill   albuterol  (PROVENTIL ) (2.5 MG/3ML) 0.083% nebulizer solution Inhale 1 vial via nebulizer every 6 hours as needed for wheezing or shortness of breath 90 mL 11   albuterol  (VENTOLIN  HFA) 108 (90 Base) MCG/ACT inhaler Inhale 2 puffs by mouth every 6 hours as needed for shortness of breath or wheezing 6.7 each 11   aspirin  EC 81 MG tablet Take 1 tablet (81 mg total) by mouth daily. Swallow whole. 90 tablet 3   budeson-glycopyrrolate -formoterol  (BREZTRI  AEROSPHERE) 160-9-4.8 MCG/ACT AERO INHALE TWO (2) PUFFS BY MOUTH INTO THE LUNGS TWICE DAILY AS NEEDED FOR SHORTNESS OF BREATH 10.7 g 10   buPROPion  (WELLBUTRIN  XL) 150 MG 24 hr tablet Take 1 tablet by mouth every morning 30 tablet 11   cloNIDine  (CATAPRES ) 0.1 MG tablet TAKE ONE (1) TABLET BY MOUTH TWICE DAILY 60 tablet 10   hydrOXYzine  (VISTARIL ) 25 MG capsule Take 1 capsule (25 mg total) by mouth every 8 (eight) hours as needed. (Patient not taking: Reported on 09/20/2023) 30 capsule 0   isosorbide  mononitrate (IMDUR ) 30 MG 24 hr tablet Take 1 tablet by mouth every day 30 tablet 11   [Paused] lisinopril -hydrochlorothiazide  (ZESTORETIC ) 20-12.5 MG tablet TAKE TWO (2) TABLETS BY MOUTH EVERY MORNING (Patient not taking: No sig reported) 60 tablet 10   meclizine  (ANTIVERT ) 25 MG tablet TAKE 1 TABLET BY MOUTH THREE TIMES DAILY AS NEEDED FOR DIZZINESS *REFILL REQUEST* 180 tablet 1   nitroGLYCERIN  (NITROSTAT ) 0.4 MG SL tablet Place 1 tablet (0.4 mg total) under the tongue every 5 (five) minutes as needed for chest pain. 30 tablet 1   omeprazole  (PRILOSEC) 40 MG capsule TAKE 1 CAPSULE BY MOUTH EVERY MORNING 180 capsule 1   oxyCODONE -acetaminophen  (PERCOCET) 10-325 MG tablet Take 1 tablet by mouth every 6 (six) hours as needed for moderate pain (pain score 4-6). (Patient not taking: Reported on 10/09/2023)     potassium chloride  (KLOR-CON  M) 10 MEQ tablet TAKE 1 TABLET BY MOUTH ONCE DAILY (Patient not taking: Reported on 10/09/2023)  30 tablet 10   promethazine  (PHENERGAN ) 25 MG tablet Take 1 tablet by mouth every 6 hours as needed for nausea and vomiting 120 tablet 2   rosuvastatin  (CRESTOR ) 20 MG tablet Take 1 tablet (20 mg total) by mouth at bedtime. 90 tablet 1   traZODone  (DESYREL ) 100 MG tablet TAKE 1 TABLET BY MOUTH DAILY AT BEDTIME 30 tablet 10   Vitamin D , Ergocalciferol , (DRISDOL ) 1.25 MG (50000 UNIT) CAPS capsule TAKE ONE CAPSULE BY MOUTH ONCE WEEKLY ON MONDAY (Patient not taking: No sig reported) 12 capsule 2   No current facility-administered medications on file prior to visit.   Past Medical History:  Diagnosis Date   Anxiety    Aortic atherosclerosis (HCC)    Ascending aorta dilation (HCC)  a.) TTE 09/02/19 --> mild; measured 39mm.   Asthma    BMI 24.0-24.9, adult 02/14/2022   CAD (coronary artery disease)    a,) CTA 08/24/20 --> mild CAD (25-49%) in RCA, LAD, LCx; CAC/Agatston score 509.   Cataract    Chronic left shoulder pain 09/27/2015   COPD (chronic obstructive pulmonary disease) (HCC)    Coronary artery disease involving native coronary artery of native heart without angina pectoris 11/18/2020   Degenerative joint disease involving multiple joints 09/28/2017   Depression    Depression, major, single episode, severe (HCC) 03/30/2020   Diastolic dysfunction    a.) TTE 09/02/19 --> LVEF 60-65%; G2DD. b.) TTE 07/23/20 --> LVEF 70-75%; G1DD.   Enlarged aorta (HCC)    Essential hypertension 08/07/2019   GERD (gastroesophageal reflux disease)    GERD with esophagitis 08/07/2019   History of kidney stones    Hyperlipidemia    Hypertension    Long-term current use of opiate analgesic 06/25/2017   Lupus    Myocardial infarction (HCC)    Osteoarthritis    PAC (premature atrial contraction)    a.) Holter 12/04/19 --> occassional; 1.1% PAC burden.   Pain of right hip joint 11/22/2015   Paraesophageal hernia    PAT (paroxysmal atrial tachycardia) (HCC) 02/03/2020   Presbyesophagus    Primary  osteoarthritis of left knee 02/04/2015   PSVT (paroxysmal supraventricular tachycardia) (HCC)    a.) Holter 12/04/19 --> 22 runs.   Renal calculus 12/21/2021   S/P repair of paraesophageal hernia 02/22/2021   S/P total knee arthroplasty 02/15/2015   Schatzki's ring    Sciatica 01/31/2022   Tobacco use 08/19/2019   Type 2 diabetes mellitus with other specified complication (HCC) 08/07/2019   Past Surgical History:  Procedure Laterality Date   ABDOMINAL AORTOGRAM W/LOWER EXTREMITY Bilateral 09/25/2023   Procedure: ABDOMINAL AORTOGRAM W/LOWER EXTREMITY;  Surgeon: Margherita Shell, MD;  Location: [facility];  Service: Cardiovascular;  Laterality: Bilateral;   ABDOMINAL HYSTERECTOMY     BLADDER SUSPENSION     BREAST BIOPSY Right    COLONOSCOPY  09/04/2011   Colonic polyps, status post polyectomy. Incidental small ascending colon lipoma   ENDARTERECTOMY FEMORAL Right 09/28/2023   Procedure: ENDARTERECTOMY, FEMORAL;  Surgeon: Margherita Shell, MD;  Location: [facility];  Service: Vascular;  Laterality: Right;   ESOPHAGOGASTRODUODENOSCOPY  09/08/2015   Schatzki ring status post esophageal dilitation. Small hiatal hernia   hemorrhoid surgery     KNEE SURGERY     left   PATCH ANGIOPLASTY Right 09/28/2023   Procedure: RIGHT FEMORAL ANGIOPLASTY, USING GREATER SAPHENOUS VEIN;  Surgeon: Margherita Shell, MD;  Location: [facility];  Service: Vascular;  Laterality: Right;   TOTAL KNEE ARTHROPLASTY Left 02/15/2015   Procedure: TOTAL KNEE ARTHROPLASTY;  Surgeon: Christie Cox, MD;  Location: [facility];  Service: Orthopedics;  Laterality: Left;   TUBAL LIGATION     VEIN HARVEST Right 09/28/2023   Procedure: SURGICAL PROCUREMENT, RIGHT GREATER SAPHENOUS VEIN;  Surgeon: Margherita Shell, MD;  Location: [facility];  Service: Vascular;  Laterality: Right;   WRIST SURGERY     right   XI ROBOTIC ASSISTED PARAESOPHAGEAL HERNIA REPAIR N/A 02/22/2021   Procedure: XI ROBOTIC ASSISTED PARAESOPHAGEAL HERNIA REPAIR with RNFA to assist;   Surgeon: Alben Alma, MD;  Location: [facility];  Service: General;  Laterality: N/A;    Family History  Problem Relation Age of Onset   Stroke Mother    Heart disease Mother    COPD  Father    Cancer Father        Bone   Diabetes Father    Heart disease Father    Stroke Father    Lupus Sister    Seizures Son    COPD Son    Colon cancer Neg Hx    Esophageal cancer Neg Hx    Stomach cancer Neg Hx    Rectal cancer Neg Hx    Social History   Socioeconomic History   Marital status: Widowed    Spouse name: Not on file   Number of children: 4   Years of education: Not on file   Highest education level: Not on file  Occupational History   Occupation: Retired  Tobacco Use   Smoking status: Former    Current packs/day: 0.00    Average packs/day: 1 pack/day for 48.0 years (48.0 ttl pk-yrs)    Types: Cigarettes    Quit date: 09/28/2023    Years since quitting: 0.0   Smokeless tobacco: Never  Vaping Use   Vaping status: Never Used  Substance and Sexual Activity   Alcohol use: No   Drug use: No   Sexual activity: Not Currently  Other Topics Concern   Not on file  Social History Narrative   Not on file   Social Drivers of Health   Financial Resource Strain: Low Risk  (07/13/2023)   Overall Financial Resource Strain (CARDIA)    Difficulty of Paying Living Expenses: Not very hard  Food Insecurity: No Food Insecurity (10/04/2023)   Hunger Vital Sign    Worried About Running Out of Food in the Last Year: Never true    Ran Out of Food in the Last Year: Never true  Transportation Needs: No Transportation Needs (10/04/2023)   PRAPARE - Administrator, Civil Service (Medical): No    Lack of Transportation (Non-Medical): No  Recent Concern: Transportation Needs - Unmet Transportation Needs (09/28/2023)   PRAPARE - Transportation    Lack of Transportation (Medical): Yes    Lack of Transportation (Non-Medical): Yes  Physical Activity: Inactive (08/01/2022)   Exercise Vital  Sign    Days of Exercise per Week: 0 days    Minutes of Exercise per Session: 0 min  Stress: Stress Concern Present (08/01/2022)   [employer] of Occupational Health - Occupational Stress Questionnaire    Feeling of Stress : To some extent  Social Connections: Socially Isolated (09/28/2023)   Social Connection and Isolation Panel [NHANES]    Frequency of Communication with Friends and Family: More than three times a week    Frequency of Social Gatherings with Friends and Family: Twice a week    Attends Religious Services: Never    Database administrator or Organizations: No    Attends Banker Meetings: Never    Marital Status: Widowed    Objective:  BP 100/60   Pulse 60   Temp 98 F (36.7 C)   Ht 5\' 2"  (1.575 m)   Wt 118 lb 13.3 oz (53.9 kg)   SpO2 98%   BMI 21.73 kg/m      10/11/2023    2:33 PM 10/03/2023    3:04 AM 10/03/2023    2:00 AM  BP/Weight  Systolic BP 100 129 110  Diastolic BP 60 63 81  Wt. (Lbs) 118.83    BMI 21.73 kg/m2      Physical Exam Cardiovascular:     Rate and Rhythm: Normal rate and regular rhythm.  Pulmonary:  Effort: Pulmonary effort is normal.     Breath sounds: Normal breath sounds.  Musculoskeletal:     Comments: No cyanosis of toes. Normal capillary return on right.  Pitting edema in right leg. Incision in right groin and right medial thigh appears to be healing well. Bruise on left lateral thigh, no hematoma. Toenail fungus on all toenails.  Neurological:     General: No focal deficit present.     Diabetic Foot Exam - Simple   No data filed      Lab Results  Component Value Date   WBC 5.0 10/01/2023   HGB 8.6 (L) 10/01/2023   HCT 25.5 (L) 10/01/2023   PLT 130 (L) 10/01/2023   GLUCOSE 95 10/01/2023   CHOL 96 09/29/2023   TRIG 32 09/29/2023   HDL 57 09/29/2023   LDLCALC 33 09/29/2023   ALT 12 09/28/2023   AST 16 09/28/2023   NA 135 10/01/2023   K 3.4 (L) 10/01/2023   CL 102 10/01/2023   CREATININE 0.57  10/01/2023   BUN 8 10/01/2023   CO2 29 10/01/2023   TSH 1.030 08/14/2023   INR 1.0 09/28/2023   HGBA1C 5.3 08/14/2023      Assessment & Plan:  Peripheral artery disease (HCC) Assessment & Plan: Peripheral artery disease with postoperative swelling Postoperative swelling and pain in the right leg following right iliofemoral endarterectomy with greater saphenous vein patch angioplasty and superficial femoral artery thrombectomy. Swelling and pain are likely postoperative, not due to a thrombus. Significant swelling around the knee and thigh causes severe pain and ambulation difficulty. No infection or thrombus signs were present in the emergency department. Bruising on the left lower abdomen and left lateral thigh without hematoma. Incision in the right groin and medial thigh is healing well. Pitting edema present in the right leg. - Continue elevating the right leg and wrapping it with an ACE bandage. - Monitor for signs of infection or worsening symptoms. - Follow up with vascular specialists. - Prescribe ibuprofen  800 mg as needed for pain, with caution to avoid overuse. - Advise leg elevation during rest.   Hospital discharge follow-up Assessment & Plan: Admitted from 09/28/23 till 10/02/23 as noted above. Transition of care communication noted    Atherosclerosis of native artery of right lower extremity with rest pain (HCC)  Hypertension associated with diabetes [facility]) Assessment & Plan: Hypertension management complicated by hypotension in the hospital, leading to temporary discontinuation of lisinopril  hydrochlorothiazide . Current blood pressure is low, suggesting continued withholding of antihypertensive medication is appropriate. - Continue to hold lisinopril  hydrochlorothiazide  until further notice. - Monitor blood pressure regularly.   Tobacco use Assessment & Plan:  Nicotine  dependence, in remission Nicotine  dependence in remission for two weeks and one day. She has  quit smoking after 50 years due to its impact on her health, particularly peripheral artery disease. Strong commitment to remaining smoke-free, with measures in place to avoid exposure to smoking environments. - Encourage continued abstinence from smoking. - Provide support and reinforcement for smoking cessation efforts.   Other orders -     Ibuprofen ; Take 1 tablet (800 mg total) by mouth every 8 (eight) hours as needed.  Dispense: 90 tablet; Refill: 1     Meds ordered this encounter  Medications   ibuprofen  (ADVIL ) 800 MG tablet    Sig: Take 1 tablet (800 mg total) by mouth every 8 (eight) hours as needed.    Dispense:  90 tablet    Refill:  1    No  orders of the defined types were placed in this encounter.   Assessment and Plan           Follow-up: Return in about 4 weeks (around 11/08/2023) for chronic disease follow up.  An After Visit Summary was printed and given to the patient.  Misako Roeder, MD [facility] [phone]

## 2023-10-15 DIAGNOSIS — Z7982 Long term (current) use of aspirin: Secondary | ICD-10-CM | POA: Diagnosis not present

## 2023-10-15 DIAGNOSIS — E1136 Type 2 diabetes mellitus with diabetic cataract: Secondary | ICD-10-CM | POA: Diagnosis not present

## 2023-10-15 DIAGNOSIS — E7800 Pure hypercholesterolemia, unspecified: Secondary | ICD-10-CM | POA: Diagnosis not present

## 2023-10-15 DIAGNOSIS — G8929 Other chronic pain: Secondary | ICD-10-CM | POA: Diagnosis not present

## 2023-10-15 DIAGNOSIS — Z48812 Encounter for surgical aftercare following surgery on the circulatory system: Secondary | ICD-10-CM | POA: Diagnosis not present

## 2023-10-15 DIAGNOSIS — J4489 Other specified chronic obstructive pulmonary disease: Secondary | ICD-10-CM | POA: Diagnosis not present

## 2023-10-15 DIAGNOSIS — K219 Gastro-esophageal reflux disease without esophagitis: Secondary | ICD-10-CM | POA: Diagnosis not present

## 2023-10-15 DIAGNOSIS — I252 Old myocardial infarction: Secondary | ICD-10-CM | POA: Diagnosis not present

## 2023-10-15 DIAGNOSIS — I700 Atherosclerosis of aorta: Secondary | ICD-10-CM | POA: Diagnosis not present

## 2023-10-15 DIAGNOSIS — I10 Essential (primary) hypertension: Secondary | ICD-10-CM | POA: Diagnosis not present

## 2023-10-15 DIAGNOSIS — I2510 Atherosclerotic heart disease of native coronary artery without angina pectoris: Secondary | ICD-10-CM | POA: Diagnosis not present

## 2023-10-15 DIAGNOSIS — F17210 Nicotine dependence, cigarettes, uncomplicated: Secondary | ICD-10-CM | POA: Diagnosis not present

## 2023-10-15 DIAGNOSIS — Z556 Problems related to health literacy: Secondary | ICD-10-CM | POA: Diagnosis not present

## 2023-10-15 DIAGNOSIS — M159 Polyosteoarthritis, unspecified: Secondary | ICD-10-CM | POA: Diagnosis not present

## 2023-10-15 NOTE — Assessment & Plan Note (Addendum)
 Admitted from 09/28/23 till 10/02/23 as noted above. Transition of care communication noted

## 2023-10-15 NOTE — Assessment & Plan Note (Signed)
 Hypertension management complicated by hypotension in the hospital, leading to temporary discontinuation of lisinopril  hydrochlorothiazide . Current blood pressure is low, suggesting continued withholding of antihypertensive medication is appropriate. - Continue to hold lisinopril  hydrochlorothiazide  until further notice. - Monitor blood pressure regularly.

## 2023-10-15 NOTE — Assessment & Plan Note (Signed)
  Nicotine  dependence, in remission Nicotine  dependence in remission for two weeks and one day. She has quit smoking after 50 years due to its impact on her health, particularly peripheral artery disease. Strong commitment to remaining smoke-free, with measures in place to avoid exposure to smoking environments. - Encourage continued abstinence from smoking. - Provide support and reinforcement for smoking cessation efforts.

## 2023-10-15 NOTE — Assessment & Plan Note (Signed)
 Peripheral artery disease with postoperative swelling Postoperative swelling and pain in the right leg following right iliofemoral endarterectomy with greater saphenous vein patch angioplasty and superficial femoral artery thrombectomy. Swelling and pain are likely postoperative, not due to a thrombus. Significant swelling around the knee and thigh causes severe pain and ambulation difficulty. No infection or thrombus signs were present in the emergency department. Bruising on the left lower abdomen and left lateral thigh without hematoma. Incision in the right groin and medial thigh is healing well. Pitting edema present in the right leg. - Continue elevating the right leg and wrapping it with an ACE bandage. - Monitor for signs of infection or worsening symptoms. - Follow up with vascular specialists. - Prescribe ibuprofen  800 mg as needed for pain, with caution to avoid overuse. - Advise leg elevation during rest.

## 2023-10-17 ENCOUNTER — Other Ambulatory Visit: Payer: Self-pay | Admitting: Family Medicine

## 2023-10-17 ENCOUNTER — Telehealth: Payer: Self-pay

## 2023-10-17 ENCOUNTER — Other Ambulatory Visit: Payer: Self-pay

## 2023-10-17 DIAGNOSIS — K529 Noninfective gastroenteritis and colitis, unspecified: Secondary | ICD-10-CM

## 2023-10-19 ENCOUNTER — Telehealth: Payer: Self-pay

## 2023-10-19 NOTE — Transitions of Care (Post Inpatient/ED Visit) (Signed)
 Transition of Care week 3  Visit Note  10/19/2023  Name: [name] [name] MRN: [id number]          DOB: [birth year]  Situation: Patient enrolled in [facility] 30-day program. Visit completed with patient by telephone.   Background: Patient being followed by [facility] RN related to hospitalization 09/28/23 - 10/02/23 Atherosclerosis of native artery of right lower extremity with rest pain, PAD - femoral endarterectomy of her R leg on 5/2 with Dr. Charlotte Cookey. Patient reports her air conditioner stopped working and has had people out looking at it and states they are returning today to repair it. Patient also states she has a slight productive cough with clear phlegm and states she may be catching a cold, education provided on monitoring and calling with worsening cough. Patient saw PCP 10/11/23 and has new Ibuprofen  800mg  and states she has pain management appointment 10/25/23. Patient also states PT was there either Monday or Tuesday and they are coming 2x/week. Patient states swelling to lower leg has improved and upper leg has improved slightly   Initial Transition Care Management Follow-up Telephone Call    Past Medical History:  Diagnosis Date   Anxiety    Aortic atherosclerosis (HCC)    Ascending aorta dilation (HCC)    a.) TTE 09/02/19 --> mild; measured 39mm.   Asthma    BMI 24.0-24.9, adult 02/14/2022   CAD (coronary artery disease)    a,) CTA 08/24/20 --> mild CAD (25-49%) in RCA, LAD, LCx; CAC/Agatston score 509.   Cataract    Chronic left shoulder pain 09/27/2015   COPD (chronic obstructive pulmonary disease) (HCC)    Coronary artery disease involving native coronary artery of native heart without angina pectoris 11/18/2020   Degenerative joint disease involving multiple joints 09/28/2017   Depression    Depression, major, single episode, severe (HCC) 03/30/2020   Diastolic dysfunction    a.) TTE 09/02/19 --> LVEF 60-65%; G2DD. b.) TTE 07/23/20 --> LVEF 70-75%; G1DD.   Enlarged aorta (HCC)    Essential  hypertension 08/07/2019   GERD (gastroesophageal reflux disease)    GERD with esophagitis 08/07/2019   History of kidney stones    Hyperlipidemia    Hypertension    Long-term current use of opiate analgesic 06/25/2017   Lupus    Myocardial infarction (HCC)    Osteoarthritis    PAC (premature atrial contraction)    a.) Holter 12/04/19 --> occassional; 1.1% PAC burden.   Pain of right hip joint 11/22/2015   Paraesophageal hernia    PAT (paroxysmal atrial tachycardia) (HCC) 02/03/2020   Presbyesophagus    Primary osteoarthritis of left knee 02/04/2015   PSVT (paroxysmal supraventricular tachycardia) (HCC)    a.) Holter 12/04/19 --> 22 runs.   Renal calculus 12/21/2021   S/P repair of paraesophageal hernia 02/22/2021   S/P total knee arthroplasty 02/15/2015   Schatzki's ring    Sciatica 01/31/2022   Tobacco use 08/19/2019   Type 2 diabetes mellitus with other specified complication (HCC) 08/07/2019    Assessment: Patient Reported Symptoms: Cognitive Cognitive Status: Alert and oriented to person, place, and time, Normal speech and language skills Cognitive/Intellectual Conditions Management [RPT]: None reported or documented in medical history or problem list      Neurological Neurological Review of Symptoms: No symptoms reported    HEENT HEENT Symptoms Reported: No symptoms reported      Cardiovascular   Cardiovascular Conditions: Coronary artery disease, Hypertension Cardiovascular Comment: Patient states she has not weighed and states her last cigarette was 4  weeks ago  Respiratory Respiratory Symptoms Reported: Productive cough Additional Respiratory Details: Patient states she had PT earlier this week and states the shortness of breath only happened one visit - patient states today she is coughing up clear phelm - education provided and when to call    Endocrine Patient reports the following symptoms related to hypoglycemia or hyperglycemia : No symptoms reported     Gastrointestinal Additional Gastrointestinal Details: Patient states previous constipation has resolved - last BM yesterday      Genitourinary Genitourinary Symptoms Reported: Other Other Genitourinary Symptoms: Patient has occasionall bladder incontinence and denies skin breakdown - R leg incision site patient denies infection and states "it looks real good"    Integumentary Additional Integumentary Details: Atherosclerosis of native artery of right lower extremity with rest pain, PAD - femoral endarterectomy of her R leg - at groin patient denied any signs of infection    Musculoskeletal Musculoskelatal Symptoms Reviewed: No symptoms reported        Psychosocial           There were no vitals filed for this visit.  Medications Reviewed Today     Reviewed by Sharmaine Dearth, RN (Registered Nurse) on 10/19/23 at 1144  Med List Status: <None>   Medication Order Taking? Sig Documenting Provider Last Dose Status Informant  albuterol  (PROVENTIL ) (2.5 MG/3ML) 0.083% nebulizer solution [id number] Yes Inhale 1 vial via nebulizer every 6 hours as needed for wheezing or shortness of breath Sirivol, Alla Ar, MD Taking Active Self  albuterol  (VENTOLIN  HFA) 108 (90 Base) MCG/ACT inhaler [id number] Yes Inhale 2 puffs by mouth every 6 hours as needed for shortness of breath or wheezing Sirivol, Mamatha, MD Taking Active Self  aspirin  EC 81 MG tablet [id number] Yes Take 1 tablet (81 mg total) by mouth daily. Swallow whole. Tobb, Kardie, DO Taking Active Self  budeson-glycopyrrolate -formoterol  (BREZTRI  AEROSPHERE) 160-9-4.8 MCG/ACT AERO [id number] Yes INHALE TWO (2) PUFFS BY MOUTH INTO THE LUNGS TWICE DAILY AS NEEDED FOR SHORTNESS OF BREATH Sirivol, Mamatha, MD Taking Active Self  buPROPion  (WELLBUTRIN  XL) 150 MG 24 hr tablet [id number] Yes Take 1 tablet by mouth every morning Sirivol, Alla Ar, MD Taking Active Self  cloNIDine  (CATAPRES ) 0.1 MG tablet [id number] Yes TAKE ONE (1) TABLET BY MOUTH TWICE  DAILY Sirivol, Mamatha, MD Taking Active Self  hydrOXYzine  (VISTARIL ) 25 MG capsule [id number] No Take 1 capsule (25 mg total) by mouth every 8 (eight) hours as needed.  Patient not taking: Reported on 09/20/2023   CoxBurleigh Carp, MD Not Taking Active Self  ibuprofen  (ADVIL ) 800 MG tablet [id number] Yes Take 1 tablet (800 mg total) by mouth every 8 (eight) hours as needed. Sirivol, Mamatha, MD Taking Active   isosorbide  mononitrate (IMDUR ) 30 MG 24 hr tablet [id number] Yes Take 1 tablet by mouth every day Sirivol, Mamatha, MD Taking Active Self  lisinopril -hydrochlorothiazide  (ZESTORETIC ) 20-12.5 MG tablet [id number] No TAKE TWO (2) TABLETS BY MOUTH EVERY MORNING  Patient not taking: No sig reported   Sirivol, Mamatha, MD Not Taking Active Self  meclizine  (ANTIVERT ) 25 MG tablet [id number] Yes TAKE 1 TABLET BY MOUTH THREE TIMES DAILY AS NEEDED FOR DIZZINESS *REFILL REQUEST* Cox, Kirsten, MD Taking Active Self  nitroGLYCERIN  (NITROSTAT ) 0.4 MG SL tablet [id number] Yes Place 1 tablet (0.4 mg total) under the tongue every 5 (five) minutes as needed for chest pain. Sirivol, Mamatha, MD Taking Active Self  omeprazole  (PRILOSEC) 40 MG capsule [id number] Yes TAKE 1 CAPSULE BY MOUTH EVERY MORNING Cox, Kirsten, MD Taking Active Self  oxyCODONE -acetaminophen  (PERCOCET) 10-325 MG tablet [id number] No Take 1 tablet by mouth every 6 (six) hours as needed for moderate pain (pain score 4-6).  Patient not taking: Reported on 10/09/2023   [provider] Not Taking Active Self           Med Note Bernadene Brewer, [place] A   Tue Oct 09, 2023  3:10 PM) Patient states she is out of this medication  potassium chloride  (KLOR-CON  M) 10 MEQ tablet [id number] No TAKE 1 TABLET BY MOUTH ONCE DAILY  Patient not taking: Reported on 10/09/2023   Sirivol, Mamatha, MD Not Taking Active Self  promethazine  (PHENERGAN ) 25 MG tablet [id number] Yes Take 1 tablet by mouth every 6 hours as needed for nausea/vomiting. Sirivol, Mamatha, MD Taking  Active   rosuvastatin  (CRESTOR ) 10 MG tablet [id number] Yes Take 1 tablet by mouth at bedtime Sirivol, Mamatha, MD Taking Active   rosuvastatin  (CRESTOR ) 20 MG tablet [id number] Yes Take 1 tablet (20 mg total) by mouth at bedtime. Bonnye Butts, PA-C Taking Active   traZODone  (DESYREL ) 100 MG tablet [id number] Yes TAKE 1 TABLET BY MOUTH DAILY AT BEDTIME Sirivol, Mamatha, MD Taking Active Self  Vitamin D , Ergocalciferol , (DRISDOL ) 1.25 MG (50000 UNIT) CAPS capsule [id number] No TAKE ONE CAPSULE BY MOUTH ONCE WEEKLY ON MONDAY  Patient not taking: No sig reported   Mercy Stall, MD Not Taking Active Self            Recommendation:   Specialty provider follow-up pain management 10/25/23 and surgeon, Dr Charlotte Cookey 11/05/23  Follow Up Plan:   Telephone follow up appointment date/time:  10/26/23 11am   Tonia Frankel RN, CCM [place]  VBC[facility]-Population Health RN Care Manager [phone]

## 2023-10-26 ENCOUNTER — Telehealth

## 2023-10-29 ENCOUNTER — Telehealth: Payer: Self-pay

## 2023-10-29 NOTE — Transitions of Care (Post Inpatient/ED Visit) (Signed)
 Care Management  Transitions of Care Program Transitions of Care Post-discharge week 4  10/29/2023 Name: [name] [name] MRN: [id number] DOB: July 09, 1945  Subjective: [name] [name] is a 78 y.o. year old female who is a primary care patient of Sirivol, Mamatha, MD. The Care Management team was unable to reach the patient by phone to assess and address transitions of care needs.   Plan: Additional outreach attempts will be made to reach the patient enrolled in the [facility] Program (Post Inpatient/ED Visit).Attempt #2 unsuccessful   Tonia Frankel RN, CCM [place]  VBCI-Population Health RN Care Manager [phone]

## 2023-10-30 ENCOUNTER — Telehealth: Payer: Self-pay

## 2023-10-30 NOTE — Telephone Encounter (Signed)
 Received faxes from [facility].  [name] [name] has missed her last 2 HH visits (5/23 and 5/28).

## 2023-10-31 DIAGNOSIS — G8929 Other chronic pain: Secondary | ICD-10-CM | POA: Diagnosis not present

## 2023-10-31 DIAGNOSIS — Z48812 Encounter for surgical aftercare following surgery on the circulatory system: Secondary | ICD-10-CM | POA: Diagnosis not present

## 2023-10-31 DIAGNOSIS — K219 Gastro-esophageal reflux disease without esophagitis: Secondary | ICD-10-CM | POA: Diagnosis not present

## 2023-10-31 DIAGNOSIS — E7800 Pure hypercholesterolemia, unspecified: Secondary | ICD-10-CM | POA: Diagnosis not present

## 2023-10-31 DIAGNOSIS — F17210 Nicotine dependence, cigarettes, uncomplicated: Secondary | ICD-10-CM | POA: Diagnosis not present

## 2023-10-31 DIAGNOSIS — E1136 Type 2 diabetes mellitus with diabetic cataract: Secondary | ICD-10-CM | POA: Diagnosis not present

## 2023-10-31 DIAGNOSIS — J4489 Other specified chronic obstructive pulmonary disease: Secondary | ICD-10-CM | POA: Diagnosis not present

## 2023-10-31 DIAGNOSIS — Z7982 Long term (current) use of aspirin: Secondary | ICD-10-CM | POA: Diagnosis not present

## 2023-10-31 DIAGNOSIS — M159 Polyosteoarthritis, unspecified: Secondary | ICD-10-CM | POA: Diagnosis not present

## 2023-10-31 DIAGNOSIS — I700 Atherosclerosis of aorta: Secondary | ICD-10-CM | POA: Diagnosis not present

## 2023-10-31 DIAGNOSIS — I2510 Atherosclerotic heart disease of native coronary artery without angina pectoris: Secondary | ICD-10-CM | POA: Diagnosis not present

## 2023-10-31 DIAGNOSIS — I10 Essential (primary) hypertension: Secondary | ICD-10-CM | POA: Diagnosis not present

## 2023-10-31 DIAGNOSIS — Z556 Problems related to health literacy: Secondary | ICD-10-CM | POA: Diagnosis not present

## 2023-10-31 DIAGNOSIS — I252 Old myocardial infarction: Secondary | ICD-10-CM | POA: Diagnosis not present

## 2023-11-05 ENCOUNTER — Encounter: Payer: Self-pay | Admitting: Physician Assistant

## 2023-11-05 ENCOUNTER — Ambulatory Visit: Attending: Surgery | Admitting: Physician Assistant

## 2023-11-05 VITALS — BP 170/75 | HR 61 | Temp 98.1°F | Ht 62.0 in | Wt 118.0 lb

## 2023-11-05 DIAGNOSIS — I70223 Atherosclerosis of native arteries of extremities with rest pain, bilateral legs: Secondary | ICD-10-CM

## 2023-11-05 DIAGNOSIS — I739 Peripheral vascular disease, unspecified: Secondary | ICD-10-CM

## 2023-11-05 DIAGNOSIS — M7989 Other specified soft tissue disorders: Secondary | ICD-10-CM

## 2023-11-05 NOTE — Progress Notes (Signed)
 POST OPERATIVE OFFICE NOTE    CC:  F/u for surgery  HPI:  This is a 78 y.o. female who is s/p right iliofemoral endarterectomy with vein patch angioplasty, right SFA thrombectomy, right GSV harvest with Provena vac application on 09/28/23 by Dr. Charlotte Cookey.  This was for rest pain in right leg. She did well post op and was discharged home POD#4. HH PT/OT was arranged with [facility]  Pt returns today for follow up.  Pt states overall she is doing okay. She is still having a lot of pain on bottom of her right foot and across toes. This occurs with weight bearing. She says its a sharp aching pain and occasionally pins and needles. She says this is unchanged from prior to her surgery. She otherwise feels that her groin is healing well. She has been having a lot of swelling in her leg especially with prolonged standing or ambulation. This is improved with elevation and she has been using ACE bandages to wrap her legs. She says she got HH PT 2x then they never returned. She has been trying to do exercises on her own since. She mostly uses cane to ambulate. She has not smoked since her surgery. Medically managed on Aspirn and statin  Allergies  Allergen Reactions   Meperidine Anaphylaxis and Other (See Comments)    Blood pressure dropped, also   Meperidine Hcl Anaphylaxis and Other (See Comments)    B/P dropped, also   Gabapentin  Nausea And Vomiting        Influenza Vaccines Other (See Comments)    Flu-like symptoms - patient states that she can tolerate this    Other Rash and Other (See Comments)    asparagus    Current Outpatient Medications  Medication Sig Dispense Refill   albuterol  (PROVENTIL ) (2.5 MG/3ML) 0.083% nebulizer solution Inhale 1 vial via nebulizer every 6 hours as needed for wheezing or shortness of breath 90 mL 11   albuterol  (VENTOLIN  HFA) 108 (90 Base) MCG/ACT inhaler Inhale 2 puffs by mouth every 6 hours as needed for shortness of breath or wheezing 6.7 each 11   aspirin  EC 81  MG tablet Take 1 tablet (81 mg total) by mouth daily. Swallow whole. 90 tablet 3   budeson-glycopyrrolate -formoterol  (BREZTRI  AEROSPHERE) 160-9-4.8 MCG/ACT AERO INHALE TWO (2) PUFFS BY MOUTH INTO THE LUNGS TWICE DAILY AS NEEDED FOR SHORTNESS OF BREATH 10.7 g 10   buPROPion  (WELLBUTRIN  XL) 150 MG 24 hr tablet Take 1 tablet by mouth every morning 30 tablet 11   cloNIDine  (CATAPRES ) 0.1 MG tablet TAKE ONE (1) TABLET BY MOUTH TWICE DAILY 60 tablet 10   hydrOXYzine  (VISTARIL ) 25 MG capsule Take 1 capsule (25 mg total) by mouth every 8 (eight) hours as needed. (Patient not taking: Reported on 09/20/2023) 30 capsule 0   ibuprofen  (ADVIL ) 800 MG tablet Take 1 tablet (800 mg total) by mouth every 8 (eight) hours as needed. (Patient not taking: Reported on 10/30/2023) 90 tablet 1   isosorbide  mononitrate (IMDUR ) 30 MG 24 hr tablet Take 1 tablet by mouth every day 30 tablet 11   [Paused] lisinopril -hydrochlorothiazide  (ZESTORETIC ) 20-12.5 MG tablet TAKE TWO (2) TABLETS BY MOUTH EVERY MORNING (Patient not taking: No sig reported) 60 tablet 10   meclizine  (ANTIVERT ) 25 MG tablet TAKE 1 TABLET BY MOUTH THREE TIMES DAILY AS NEEDED FOR DIZZINESS *REFILL REQUEST* 180 tablet 1   nitroGLYCERIN  (NITROSTAT ) 0.4 MG SL tablet Place 1 tablet (0.4 mg total) under the tongue every 5 (five) minutes as needed  for chest pain. (Patient not taking: Reported on 10/30/2023) 30 tablet 1   omeprazole  (PRILOSEC) 40 MG capsule TAKE 1 CAPSULE BY MOUTH EVERY MORNING 180 capsule 1   oxyCODONE -acetaminophen  (PERCOCET) 10-325 MG tablet Take 1 tablet by mouth every 8 (eight) hours as needed for moderate pain (pain score 4-6). Patient states now every 8 hours per report by patient 10/30/23     potassium chloride  (KLOR-CON  M) 10 MEQ tablet TAKE 1 TABLET BY MOUTH ONCE DAILY (Patient not taking: Reported on 10/09/2023) 30 tablet 10   promethazine  (PHENERGAN ) 25 MG tablet Take 1 tablet by mouth every 6 hours as needed for nausea/vomiting. 120 tablet 11    rosuvastatin  (CRESTOR ) 10 MG tablet Take 1 tablet by mouth at bedtime (Patient not taking: Reported on 10/30/2023) 30 tablet 11   rosuvastatin  (CRESTOR ) 20 MG tablet Take 1 tablet (20 mg total) by mouth at bedtime. 90 tablet 1   traZODone  (DESYREL ) 100 MG tablet TAKE 1 TABLET BY MOUTH DAILY AT BEDTIME 30 tablet 10   Vitamin D , Ergocalciferol , (DRISDOL ) 1.25 MG (50000 UNIT) CAPS capsule TAKE ONE CAPSULE BY MOUTH ONCE WEEKLY ON MONDAY (Patient not taking: No sig reported) 12 capsule 2   No current facility-administered medications for this visit.     ROS:  See HPI  Physical Exam:  Vitals:   11/05/23 1019  BP: (!) 170/75  Pulse: 61  Temp: 98.1 F (36.7 C)  SpO2: 96%   General: well appearing, well nourished, in no distress Incision:  right groin incision healing very well  Extremities:  BLE well perfused and warm with doppler DP/ PT/ Pero signals. Callus present on right 5th MTP joint on plantar/lateral aspect of foot Neuro: alert and oriented Abdomen:  soft  Assessment/Plan:  This is a 78 y.o. female who is s/p:right iliofemoral endarterectomy with vein patch angioplasty, right SFA thrombectomy, right GSV harvest with Provena vac application on 09/28/23 by Dr. Charlotte Cookey.  This was for rest pain in right leg. She is still having a lot of pain in her foot. I suspect this is nerve related. We discussed that this may or may not improve with time. She waited 1 month with RLE ischemia before presenting to hospital for intervention so explained that her nerves were greatly impacted by this. Hopefully with time she will see some improvement. Her right leg otherwise is clinically well perfused with doppler PT/DP/ Pero signals.  - She was unhappy with her care with [facility]. Offered to try to arrange with a different service but at this time she is not interested and feels capable of doing her own exercises at home -congratulated her on her smoking cessation and encouraged her to continue to refrain  from smoking - continue exercises and walking as tolerated - continue to elevate legs daily above level of heart to help with swelling - she was measured and fitted for knee high 15-20 mmHg compression stockings to help with her swelling - continue Aspirin  and statin - She will follow up in 3 months with [facility] - she knows to call earlier if she has new or worsening symptoms   Wynonia Hedges, [facility] [phone]   Clinic MD:  Charlotte Cookey

## 2023-11-06 ENCOUNTER — Telehealth: Payer: Self-pay

## 2023-11-06 NOTE — Telephone Encounter (Signed)
 This encounter was created in error - please disregard.

## 2023-11-07 ENCOUNTER — Other Ambulatory Visit: Payer: Self-pay

## 2023-11-07 ENCOUNTER — Telehealth: Payer: Self-pay

## 2023-11-07 DIAGNOSIS — I70223 Atherosclerosis of native arteries of extremities with rest pain, bilateral legs: Secondary | ICD-10-CM

## 2023-11-07 DIAGNOSIS — J449 Chronic obstructive pulmonary disease, unspecified: Secondary | ICD-10-CM

## 2023-11-07 NOTE — Progress Notes (Signed)
   11/07/2023  Patient ID: [name] [name], female   DOB: 1945-06-10, 78 y.o.   MRN: [id number]  Pharmacy Quality Measure Review  This patient is appearing on a report for being at risk of failing the adherence measure for hypertension (ACEi/ARB) medications this calendar year.   Medication: lisinopril -hydrochlorothiazide . 4/22, 5/21 - 30 DS filled. Temporarily held d/t low bp in hospital.   Insurance report was not up to date. No action needed at this time.   Rolando Cliche, PharmD, BCGP Clinical Pharmacist  [phone]

## 2023-11-07 NOTE — Transitions of Care (Post Inpatient/ED Visit) (Signed)
 Transition of Care Week #5 [facility] Closure  Visit Note  11/07/2023  Name: [name] [name] MRN: [id number]          DOB: 01/26/1946  Situation: Patient enrolled in [facility] 30-day program. Visit completed with patient by telephone.   Background: Patient being followed by [facility] RN related to hospitalization 09/28/23 - 10/02/23 Atherosclerosis of native artery of right lower extremity with rest pain, PAD - femoral endarterectomy of her R leg on 5/2 with Dr. Sim Dryer had f/u appt with Dr Charlotte Cookey as planned 11/05/23 and is doing better - states she had a good visit with MD and is now wearing compression socks during the day.  Patient states she is still not smoking. Patient did forget her follow up appointment with her PCP scheduled for tomorrow, 11/08/23 and states at this point she is unable to get a ride because [facility] requires 3 days notice - Patient agreed to allow [facility] RN to make a conference call and we were able to get a ride scheduled. Patient states she has appointment with pain management 11/25/23 as well. TOC RN discussed closing TOC program and transfer to CCM - patient agreeable - referral completed   Initial Transition Care Management Follow-up Telephone Call    Past Medical History:  Diagnosis Date   Anxiety    Aortic atherosclerosis (HCC)    Ascending aorta dilation (HCC)    a.) TTE 09/02/19 --> mild; measured 39mm.   Asthma    BMI 24.0-24.9, adult 02/14/2022   CAD (coronary artery disease)    a,) CTA 08/24/20 --> mild CAD (25-49%) in RCA, LAD, LCx; CAC/Agatston score 509.   Cataract    Chronic left shoulder pain 09/27/2015   COPD (chronic obstructive pulmonary disease) (HCC)    Coronary artery disease involving native coronary artery of native heart without angina pectoris 11/18/2020   Degenerative joint disease involving multiple joints 09/28/2017   Depression    Depression, major, single episode, severe (HCC) 03/30/2020   Diastolic dysfunction    a.) TTE 09/02/19 --> LVEF 60-65%; G2DD. b.)  TTE 07/23/20 --> LVEF 70-75%; G1DD.   Enlarged aorta (HCC)    Essential hypertension 08/07/2019   GERD (gastroesophageal reflux disease)    GERD with esophagitis 08/07/2019   History of kidney stones    Hyperlipidemia    Hypertension    Long-term current use of opiate analgesic 06/25/2017   Lupus    Myocardial infarction (HCC)    Osteoarthritis    PAC (premature atrial contraction)    a.) Holter 12/04/19 --> occassional; 1.1% PAC burden.   Pain of right hip joint 11/22/2015   Paraesophageal hernia    PAT (paroxysmal atrial tachycardia) (HCC) 02/03/2020   Presbyesophagus    Primary osteoarthritis of left knee 02/04/2015   PSVT (paroxysmal supraventricular tachycardia) (HCC)    a.) Holter 12/04/19 --> 22 runs.   Renal calculus 12/21/2021   S/P repair of paraesophageal hernia 02/22/2021   S/P total knee arthroplasty 02/15/2015   Schatzki's ring    Sciatica 01/31/2022   Tobacco use 08/19/2019   Type 2 diabetes mellitus with other specified complication (HCC) 08/07/2019    Assessment: Patient Reported Symptoms: Cognitive Cognitive Status: Alert and oriented to person, place, and time, Normal speech and language skills Cognitive/Intellectual Conditions Management [RPT]: None reported or documented in medical history or problem list      Neurological Neurological Review of Symptoms: No symptoms reported    HEENT HEENT Symptoms Reported: No symptoms reported      Cardiovascular Cardiovascular  Symptoms Reported: No symptoms reported    Respiratory Respiratory Symptoms Reported: No symptoms reported    Endocrine Patient reports the following symptoms related to hypoglycemia or hyperglycemia : No symptoms reported    Gastrointestinal Gastrointestinal Symptoms Reported: No symptoms reported Additional Gastrointestinal Details: Patient denies any current issues      Genitourinary      Integumentary      Musculoskeletal          Psychosocial           There were no vitals filed  for this visit.  Medications Reviewed Today     Reviewed by Sharmaine Dearth, RN (Registered Nurse) on 11/07/23 at 1410  Med List Status: <None>   Medication Order Taking? Sig Documenting Provider Last Dose Status Informant  albuterol  (PROVENTIL ) (2.5 MG/3ML) 0.083% nebulizer solution [id number] Yes Inhale 1 vial via nebulizer every 6 hours as needed for wheezing or shortness of breath Sirivol, Mamatha, MD Taking Active Self  albuterol  (VENTOLIN  HFA) 108 (90 Base) MCG/ACT inhaler [id number] Yes Inhale 2 puffs by mouth every 6 hours as needed for shortness of breath or wheezing Sirivol, Mamatha, MD Taking Active Self  aspirin  EC 81 MG tablet [id number] Yes Take 1 tablet (81 mg total) by mouth daily. Swallow whole. Tobb, Kardie, DO Taking Active Self  budeson-glycopyrrolate -formoterol  (BREZTRI  AEROSPHERE) 160-9-4.8 MCG/ACT AERO [id number] Yes INHALE TWO (2) PUFFS BY MOUTH INTO THE LUNGS TWICE DAILY AS NEEDED FOR SHORTNESS OF BREATH Sirivol, Mamatha, MD Taking Active Self  buPROPion  (WELLBUTRIN  XL) 150 MG 24 hr tablet [id number] Yes Take 1 tablet by mouth every morning Sirivol, Alla Ar, MD Taking Active Self  cloNIDine  (CATAPRES ) 0.1 MG tablet [id number] Yes TAKE ONE (1) TABLET BY MOUTH TWICE DAILY Sirivol, Mamatha, MD Taking Active Self  hydrOXYzine  (VISTARIL ) 25 MG capsule [id number] No Take 1 capsule (25 mg total) by mouth every 8 (eight) hours as needed.  Patient not taking: Reported on 09/20/2023   CoxBurleigh Carp, MD Not Taking Active Self  ibuprofen  (ADVIL ) 800 MG tablet [id number] No Take 1 tablet (800 mg total) by mouth every 8 (eight) hours as needed.  Patient not taking: Reported on 11/07/2023   Sirivol, Mamatha, MD Not Taking Active   isosorbide  mononitrate (IMDUR ) 30 MG 24 hr tablet [id number] Yes Take 1 tablet by mouth every day Sirivol, Mamatha, MD Taking Active Self  lisinopril -hydrochlorothiazide  (ZESTORETIC ) 20-12.5 MG tablet [id number] No TAKE TWO (2) TABLETS BY MOUTH EVERY MORNING  Patient  not taking: No sig reported   Sirivol, Mamatha, MD Not Taking Active Self  meclizine  (ANTIVERT ) 25 MG tablet [id number] Yes TAKE 1 TABLET BY MOUTH THREE TIMES DAILY AS NEEDED FOR DIZZINESS *REFILL REQUEST* Cox, Kirsten, MD Taking Active Self  nitroGLYCERIN  (NITROSTAT ) 0.4 MG SL tablet [id number] No Place 1 tablet (0.4 mg total) under the tongue every 5 (five) minutes as needed for chest pain.  Patient not taking: Reported on 10/30/2023   Sirivol, Mamatha, MD Not Taking Active Self           Med Note [facility] [place] A   Tue Oct 30, 2023  4:13 PM) Patient states she hasn't need this  omeprazole  (PRILOSEC) 40 MG capsule [id number] Yes TAKE 1 CAPSULE BY MOUTH EVERY MORNING Cox, Kirsten, MD Taking Active Self  oxyCODONE -acetaminophen  (PERCOCET) 10-325 MG tablet [id number] Yes Take 1 tablet by mouth every 8 (eight) hours as needed for moderate pain (pain score 4-6). Patient states now every 8 hours per report by patient 10/30/23 [provider] Taking Active Self  Med Note Bernadene Brewer, Shaquayla Klimas A   Tue Oct 30, 2023  4:14 PM) Patient states pain clinic gave her new prescription of 90 pills for this on 10/25/23  potassium chloride  (KLOR-CON  M) 10 MEQ tablet [id number] No TAKE 1 TABLET BY MOUTH ONCE DAILY  Patient not taking: Reported on 10/09/2023   Sirivol, Mamatha, MD Not Taking Active Self  promethazine  (PHENERGAN ) 25 MG tablet [id number] Yes Take 1 tablet by mouth every 6 hours as needed for nausea/vomiting. Sirivol, Mamatha, MD Taking Active   rosuvastatin  (CRESTOR ) 10 MG tablet [id number] No Take 1 tablet by mouth at bedtime  Patient not taking: Reported on 11/07/2023   Sirivol, Mamatha, MD Not Taking Active   rosuvastatin  (CRESTOR ) 20 MG tablet [id number] Yes Take 1 tablet (20 mg total) by mouth at bedtime. Bonnye Butts, PA-C Taking Active   traZODone  (DESYREL ) 100 MG tablet [id number] Yes TAKE 1 TABLET BY MOUTH DAILY AT BEDTIME Sirivol, Mamatha, MD Taking Active Self  Vitamin D , Ergocalciferol ,  (DRISDOL ) 1.25 MG (50000 UNIT) CAPS capsule [id number] No TAKE ONE CAPSULE BY MOUTH ONCE WEEKLY ON MONDAY  Patient not taking: No sig reported   Cox, Burleigh Carp, MD Not Taking Active Self            Recommendation:   Referral to: CCM  Follow Up Plan:   Closing From:  Transitions of Care Program  Tonia Frankel RN, CCM [facility]  VBCI-Population Health RN Care Manager [phone]

## 2023-11-07 NOTE — Transitions of Care (Post Inpatient/ED Visit) (Signed)
 Care Management  Transitions of Care Program Transitions of Care Post-discharge Week #5   11/07/2023 Name: [name] [name] MRN: [id number] DOB: 18-Oct-1945  Subjective: Georgeann Kindred Lesage is a 78 y.o. year old female who is a primary care patient of Sirivol, Mamatha, MD. The Care Management team was unable to reach the patient by phone to assess and address transitions of care needs.   Plan: Additional outreach attempts will be made to reach the patient enrolled in the [facility] Program (Post Inpatient/ED Visit).  Tonia Frankel RN, CCM [place]  VBCI-Population Health RN Care Manager [phone]

## 2023-11-08 ENCOUNTER — Ambulatory Visit (INDEPENDENT_AMBULATORY_CARE_PROVIDER_SITE_OTHER)

## 2023-11-08 VITALS — BP 160/82 | HR 51 | Temp 98.1°F | Ht 62.0 in | Wt 131.0 lb

## 2023-11-08 DIAGNOSIS — I10 Essential (primary) hypertension: Secondary | ICD-10-CM | POA: Diagnosis not present

## 2023-11-08 DIAGNOSIS — R600 Localized edema: Secondary | ICD-10-CM

## 2023-11-08 DIAGNOSIS — I739 Peripheral vascular disease, unspecified: Secondary | ICD-10-CM | POA: Diagnosis not present

## 2023-11-08 HISTORY — DX: Essential (primary) hypertension: I10

## 2023-11-08 HISTORY — DX: Localized edema: R60.0

## 2023-11-08 NOTE — Patient Instructions (Signed)
 Restart your BP medicine Lisinopril  20-hydrochlorothiazide  12.5 mg Check your home bp readings if you can Take the potassium pill also Let me know what your numbers are, in 1 week Follow up in 3 months or sooner.

## 2023-11-08 NOTE — Progress Notes (Signed)
 Subjective:  Patient ID: ANTONISHA WASKEY, female    DOB: 1946/03/05  Age: 78 y.o. MRN: [id number]  Chief Complaint  Patient presents with   Medical Management of Chronic Issues    HPI:  Sami is here for a follow-up appointment.  Concerned about fluid retetion in the right leg. Was weighing 118 pounds before her vascular surgery and today she weighs 131 pounds.  She had right iliofemoral endarterectomy with greater saphenous vein patch angioplasty and superficial femoral artery thrombectomy on Sep 28, 2023, due to atherosclerosis of the native artery of the right lower extremity with rest pain and peripheral arterial disease.  States her leg swelling is adding on to her weight. Not eating much  Was taken off of Lisinopril  hydrochlorothiazide  at the time of her surgery. Not sure why.  Possibly due to lower blood pressures at the time of her hospitalization back in May.  But she is concerned that almost all of her home blood pressures have been high and she is concerned about the swelling in her right lower extremity.  A blood clot has been ruled out already.       11/09/2023    9:46 AM 10/19/2023   11:57 AM 10/09/2023    9:55 AM 08/14/2023   10:59 AM 08/01/2022    3:48 PM  Depression screen PHQ 2/9  Decreased Interest 1 0 0 0 0  Down, Depressed, Hopeless 1 0 1 0 1  PHQ - 2 Score 2 0 1 0 1  Altered sleeping 1   3 3   Tired, decreased energy 1   3 3   Change in appetite 0   2 2  Feeling bad or failure about yourself  0   0 0  Trouble concentrating 0   3 2  Moving slowly or fidgety/restless 0   0 1  Suicidal thoughts 0   0 0  PHQ-9 Score 4   11 12   Difficult doing work/chores Somewhat difficult   Not difficult at all Not difficult at all        08/14/2023    2:05 PM  Fall Risk   Number falls in past yr: 1  Injury with Fall? 0  Risk for fall due to : History of fall(s);Impaired balance/gait    Patient Care Team: Sadik Piascik, MD as PCP - General (Family Medicine) Tobb,  Kardie, DO as PCP - Cardiology (Cardiology) Jens Molder, LCSW as Social Worker (Licensed Clinical Social Worker)   Review of Systems  Constitutional:  Negative for appetite change, fatigue and fever.  HENT:  Negative for congestion, ear pain, sinus pressure and sore throat.   Respiratory:  Negative for cough, chest tightness, shortness of breath and wheezing.   Cardiovascular:  Positive for leg swelling. Negative for chest pain and palpitations.  Gastrointestinal:  Negative for abdominal pain, constipation, diarrhea, nausea and vomiting.  Genitourinary:  Negative for dysuria and hematuria.  Musculoskeletal:  Negative for arthralgias, back pain, joint swelling and myalgias.  Skin:  Negative for rash.  Neurological:  Negative for dizziness, weakness and headaches.  Psychiatric/Behavioral:  Negative for dysphoric mood. The patient is not nervous/anxious.     Current Outpatient Medications on File Prior to Visit  Medication Sig Dispense Refill   albuterol  (PROVENTIL ) (2.5 MG/3ML) 0.083% nebulizer solution Inhale 1 vial via nebulizer every 6 hours as needed for wheezing or shortness of breath 90 mL 11   albuterol  (VENTOLIN  HFA) 108 (90 Base) MCG/ACT inhaler Inhale 2 puffs by mouth every 6 hours  as needed for shortness of breath or wheezing 6.7 each 11   aspirin  EC 81 MG tablet Take 1 tablet (81 mg total) by mouth daily. Swallow whole. 90 tablet 3   budeson-glycopyrrolate -formoterol  (BREZTRI  AEROSPHERE) 160-9-4.8 MCG/ACT AERO INHALE TWO (2) PUFFS BY MOUTH INTO THE LUNGS TWICE DAILY AS NEEDED FOR SHORTNESS OF BREATH 10.7 g 10   buPROPion  (WELLBUTRIN  XL) 150 MG 24 hr tablet Take 1 tablet by mouth every morning 30 tablet 11   cloNIDine  (CATAPRES ) 0.1 MG tablet TAKE ONE (1) TABLET BY MOUTH TWICE DAILY 60 tablet 10   isosorbide  mononitrate (IMDUR ) 30 MG 24 hr tablet Take 1 tablet by mouth every day 30 tablet 11   meclizine  (ANTIVERT ) 25 MG tablet TAKE 1 TABLET BY MOUTH THREE TIMES DAILY AS NEEDED  FOR DIZZINESS *REFILL REQUEST* 180 tablet 1   omeprazole  (PRILOSEC) 40 MG capsule TAKE 1 CAPSULE BY MOUTH EVERY MORNING 180 capsule 1   oxyCODONE -acetaminophen  (PERCOCET) 10-325 MG tablet Take 1 tablet by mouth every 8 (eight) hours as needed for moderate pain (pain score 4-6). Patient states now every 8 hours per report by patient 10/30/23     promethazine  (PHENERGAN ) 25 MG tablet Take 1 tablet by mouth every 6 hours as needed for nausea/vomiting. 120 tablet 11   rosuvastatin  (CRESTOR ) 20 MG tablet Take 1 tablet (20 mg total) by mouth at bedtime. 90 tablet 1   traZODone  (DESYREL ) 100 MG tablet TAKE 1 TABLET BY MOUTH DAILY AT BEDTIME 30 tablet 10   hydrOXYzine  (VISTARIL ) 25 MG capsule Take 1 capsule (25 mg total) by mouth every 8 (eight) hours as needed. 30 capsule 0   ibuprofen  (ADVIL ) 800 MG tablet Take 1 tablet (800 mg total) by mouth every 8 (eight) hours as needed. 90 tablet 1   [Paused] lisinopril -hydrochlorothiazide  (ZESTORETIC ) 20-12.5 MG tablet TAKE TWO (2) TABLETS BY MOUTH EVERY MORNING (Patient not taking: No sig reported) 60 tablet 10   nitroGLYCERIN  (NITROSTAT ) 0.4 MG SL tablet Place 1 tablet (0.4 mg total) under the tongue every 5 (five) minutes as needed for chest pain. (Patient not taking: Reported on 11/08/2023) 30 tablet 1   potassium chloride  (KLOR-CON  M) 10 MEQ tablet TAKE 1 TABLET BY MOUTH ONCE DAILY (Patient not taking: Reported on 11/09/2023) 30 tablet 10   Vitamin D , Ergocalciferol , (DRISDOL ) 1.25 MG (50000 UNIT) CAPS capsule TAKE ONE CAPSULE BY MOUTH ONCE WEEKLY ON MONDAY (Patient not taking: No sig reported) 12 capsule 2   No current facility-administered medications on file prior to visit.   Past Medical History:  Diagnosis Date   Anxiety    Aortic atherosclerosis (HCC)    Ascending aorta dilation (HCC)    a.) TTE 09/02/19 --> mild; measured 39mm.   Asthma    BMI 24.0-24.9, adult 02/14/2022   CAD (coronary artery disease)    a,) CTA 08/24/20 --> mild CAD (25-49%) in RCA,  LAD, LCx; CAC/Agatston score 509.   Cataract    Chronic left shoulder pain 09/27/2015   COPD (chronic obstructive pulmonary disease) (HCC)    Coronary artery disease involving native coronary artery of native heart without angina pectoris 11/18/2020   Degenerative joint disease involving multiple joints 09/28/2017   Depression    Depression, major, single episode, severe (HCC) 03/30/2020   Diastolic dysfunction    a.) TTE 09/02/19 --> LVEF 60-65%; G2DD. b.) TTE 07/23/20 --> LVEF 70-75%; G1DD.   Enlarged aorta (HCC)    Essential hypertension 08/07/2019   GERD (gastroesophageal reflux disease)    GERD with  esophagitis 08/07/2019   History of kidney stones    Hyperlipidemia    Hypertension    Long-term current use of opiate analgesic 06/25/2017   Lupus    Myocardial infarction [facility])    Osteoarthritis    PAC (premature atrial contraction)    a.) Holter 12/04/19 --> occassional; 1.1% PAC burden.   Pain of right hip joint 11/22/2015   Paraesophageal hernia    PAT (paroxysmal atrial tachycardia) (HCC) 02/03/2020   Presbyesophagus    Primary osteoarthritis of left knee 02/04/2015   PSVT (paroxysmal supraventricular tachycardia) (HCC)    a.) Holter 12/04/19 --> 22 runs.   Renal calculus 12/21/2021   S/P repair of paraesophageal hernia 02/22/2021   S/P total knee arthroplasty 02/15/2015   Schatzki's ring    Sciatica 01/31/2022   Tobacco use 08/19/2019   Type 2 diabetes mellitus with other specified complication (HCC) 08/07/2019   Past Surgical History:  Procedure Laterality Date   ABDOMINAL AORTOGRAM W/LOWER EXTREMITY Bilateral 09/25/2023   Procedure: ABDOMINAL AORTOGRAM W/LOWER EXTREMITY;  Surgeon: Margherita Shell, MD;  Location: [facility];  Service: Cardiovascular;  Laterality: Bilateral;   ABDOMINAL HYSTERECTOMY     BLADDER SUSPENSION     BREAST BIOPSY Right    COLONOSCOPY  09/04/2011   Colonic polyps, status post polyectomy. Incidental small ascending colon lipoma   ENDARTERECTOMY  FEMORAL Right 09/28/2023   Procedure: ENDARTERECTOMY, FEMORAL;  Surgeon: Margherita Shell, MD;  Location: [facility] OR;  Service: Vascular;  Laterality: Right;   ESOPHAGOGASTRODUODENOSCOPY  09/08/2015   Schatzki ring status post esophageal dilitation. Small hiatal hernia   hemorrhoid surgery     KNEE SURGERY     left   PATCH ANGIOPLASTY Right 09/28/2023   Procedure: RIGHT FEMORAL ANGIOPLASTY, USING GREATER SAPHENOUS VEIN;  Surgeon: Margherita Shell, MD;  Location: [facility] OR;  Service: Vascular;  Laterality: Right;   TOTAL KNEE ARTHROPLASTY Left 02/15/2015   Procedure: TOTAL KNEE ARTHROPLASTY;  Surgeon: Christie Cox, MD;  Location: [facility] OR;  Service: Orthopedics;  Laterality: Left;   TUBAL LIGATION     VEIN HARVEST Right 09/28/2023   Procedure: SURGICAL PROCUREMENT, RIGHT GREATER SAPHENOUS VEIN;  Surgeon: Margherita Shell, MD;  Location: [facility] OR;  Service: Vascular;  Laterality: Right;   WRIST SURGERY     right   XI ROBOTIC ASSISTED PARAESOPHAGEAL HERNIA REPAIR N/A 02/22/2021   Procedure: XI ROBOTIC ASSISTED PARAESOPHAGEAL HERNIA REPAIR with RNFA to assist;  Surgeon: Alben Alma, MD;  Location: [facility] ORS;  Service: General;  Laterality: N/A;    Family History  Problem Relation Age of Onset   Stroke Mother    Heart disease Mother    COPD Father    Cancer Father        Bone   Diabetes Father    Heart disease Father    Stroke Father    Lupus Sister    Seizures Son    COPD Son    Colon cancer Neg Hx    Esophageal cancer Neg Hx    Stomach cancer Neg Hx    Rectal cancer Neg Hx    Social History   Socioeconomic History   Marital status: Widowed    Spouse name: Not on file   Number of children: 4   Years of education: Not on file   Highest education level: Not on file  Occupational History   Occupation: Retired  Tobacco Use   Smoking status: Former    Current packs/day: 0.00    Average packs/day:  1 pack/day for 48.0 years (48.0 ttl pk-yrs)    Types: Cigarettes    Quit date: 09/28/2023    Years  since quitting: 0.1   Smokeless tobacco: Never  Vaping Use   Vaping status: Never Used  Substance and Sexual Activity   Alcohol use: No   Drug use: No   Sexual activity: Not Currently  Other Topics Concern   Not on file  Social History Narrative   Not on file   Social Drivers of Health   Financial Resource Strain: Low Risk  (07/13/2023)   Overall Financial Resource Strain (CARDIA)    Difficulty of Paying Living Expenses: Not very hard  Food Insecurity: No Food Insecurity (10/04/2023)   Hunger Vital Sign    Worried About Running Out of Food in the Last Year: Never true    Ran Out of Food in the Last Year: Never true  Transportation Needs: No Transportation Needs (10/04/2023)   PRAPARE - Administrator, Civil Service (Medical): No    Lack of Transportation (Non-Medical): No  Recent Concern: Transportation Needs - Unmet Transportation Needs (09/28/2023)   PRAPARE - Transportation    Lack of Transportation (Medical): Yes    Lack of Transportation (Non-Medical): Yes  Physical Activity: Inactive (08/01/2022)   Exercise Vital Sign    Days of Exercise per Week: 0 days    Minutes of Exercise per Session: 0 min  Stress: Stress Concern Present (08/01/2022)   [employer] of Occupational Health - Occupational Stress Questionnaire    Feeling of Stress : To some extent  Social Connections: Socially Isolated (09/28/2023)   Social Connection and Isolation Panel    Frequency of Communication with Friends and Family: More than three times a week    Frequency of Social Gatherings with Friends and Family: Twice a week    Attends Religious Services: Never    Database administrator or Organizations: No    Attends Banker Meetings: Never    Marital Status: Widowed    Objective:  BP (!) 160/82   Pulse (!) 51   Temp 98.1 F (36.7 C) (Temporal)   Ht 5' 2 (1.575 m)   Wt 131 lb (59.4 kg)   SpO2 98%   BMI 23.96 kg/m      11/08/2023    4:04 PM 11/08/2023    3:25 PM  11/05/2023   10:19 AM  BP/Weight  Systolic BP 160 168 170  Diastolic BP 82 72 75  Wt. (Lbs)  131 118  BMI  23.96 kg/m2 21.58 kg/m2    Physical Exam Vitals and nursing note reviewed.  Constitutional:      Appearance: Normal appearance.  HENT:     Head: Normocephalic and atraumatic.   Cardiovascular:     Rate and Rhythm: Normal rate and regular rhythm.  Pulmonary:     Effort: Pulmonary effort is normal.     Breath sounds: Normal breath sounds.   Musculoskeletal:        General: Swelling (worse in the right lower extremity, pitting) present.   Neurological:     General: No focal deficit present.     Mental Status: She is alert.   Psychiatric:        Mood and Affect: Mood normal.         Lab Results  Component Value Date   WBC 5.0 10/01/2023   HGB 8.6 (L) 10/01/2023   HCT 25.5 (L) 10/01/2023   PLT 130 (L) 10/01/2023   GLUCOSE 95  10/01/2023   CHOL 96 09/29/2023   TRIG 32 09/29/2023   HDL 57 09/29/2023   LDLCALC 33 09/29/2023   ALT 12 09/28/2023   AST 16 09/28/2023   NA 135 10/01/2023   K 3.4 (L) 10/01/2023   CL 102 10/01/2023   CREATININE 0.57 10/01/2023   BUN 8 10/01/2023   CO2 29 10/01/2023   TSH 1.030 08/14/2023   INR 1.0 09/28/2023   HGBA1C 5.3 08/14/2023      Assessment & Plan:  PAD (peripheral artery disease) (HCC) Assessment & Plan: Status post right iliofemoral endarterectomy and superficial femoral artery thrombectomy on right on Sep 28, 2023. Denies any significant concerns since his surgery except swelling in her right lower extremity. Continues to be consistent with her aspirin  81 mg daily, Crestor  20 mg daily.   Follows up with vascular surgery   Pedal edema Assessment & Plan: Appears to be postoperative edema versus dependent edema. She did have a vascular ultrasound which ruled out a DVT after her surgery.  Today on exam she does have trace to 1+ pedal edema in her right lower extremity compared to her left.  Plan: Since she has  been having elevated blood pressures and the right lower extremity swelling ever since she has been taken off of her lisinopril  hydrochlorothiazide  combination pill, I will start her back on the medication.  I do not see any contraindications to doing this. Advised that she could even start with half a tablet daily  - To continue using compression stockings, elevate her legs when resting. - Restart lisinopril  20-hydrochlorothiazide  12.5 mg - Report back if no improvement   Uncontrolled hypertension, stage 1 Assessment & Plan: She has several elevated blood pressure readings, I put her back on her home blood pressure pill which has been held since her hospital discharge in May.  Plan: Start taking lisinopril  20 mg-hydrochlorothiazide  12.5 mg. Monitor home blood pressures with an arm blood pressure cuff. Watch dietary sodium intake. Report back if blood pressures are still not controlled      No orders of the defined types were placed in this encounter.   No orders of the defined types were placed in this encounter.    Follow-up: Return in about 3 months (around 02/08/2024) for chronic disease follow up.  An After Visit Summary was printed and given to the patient.  Aunesti Pellegrino, MD [facility] [phone]

## 2023-11-09 ENCOUNTER — Other Ambulatory Visit: Payer: Self-pay | Admitting: Licensed Clinical Social Worker

## 2023-11-09 NOTE — Patient Instructions (Signed)
 Visit Information  Thank you for taking time to visit with me today. Please don't hesitate to contact me if I can be of assistance to you before our next scheduled appointment.  Your next care management appointment is no further scheduled appointments.    Please call the care guide team at [phone] if you need to cancel, schedule, or reschedule an appointment.   Please call the Suicide and Crisis Lifeline: 988 if you are experiencing a Mental Health or Behavioral Health Crisis or need someone to talk to.  Hale Level, LCSW [facility], [facility] Licensed Clinical Social Worker Care Coordinator [phone]

## 2023-11-09 NOTE — Patient Outreach (Signed)
 Complex Care Management   Visit Note  11/09/2023  Name:  [name] [name] MRN: [id number] DOB: 1945/07/16  Situation: Referral received for Complex Care Management related to Mental/Behavioral Health diagnosis MDD I obtained verbal consent from Patient.  Visit completed with patient  on the phone  Background:   Past Medical History:  Diagnosis Date   Anxiety    Aortic atherosclerosis (HCC)    Ascending aorta dilation (HCC)    a.) TTE 09/02/19 --> mild; measured 39mm.   Asthma    BMI 24.0-24.9, adult 02/14/2022   CAD (coronary artery disease)    a,) CTA 08/24/20 --> mild CAD (25-49%) in RCA, LAD, LCx; CAC/Agatston score 509.   Cataract    Chronic left shoulder pain 09/27/2015   COPD (chronic obstructive pulmonary disease) (HCC)    Coronary artery disease involving native coronary artery of native heart without angina pectoris 11/18/2020   Degenerative joint disease involving multiple joints 09/28/2017   Depression    Depression, major, single episode, severe (HCC) 03/30/2020   Diastolic dysfunction    a.) TTE 09/02/19 --> LVEF 60-65%; G2DD. b.) TTE 07/23/20 --> LVEF 70-75%; G1DD.   Enlarged aorta (HCC)    Essential hypertension 08/07/2019   GERD (gastroesophageal reflux disease)    GERD with esophagitis 08/07/2019   History of kidney stones    Hyperlipidemia    Hypertension    Long-term current use of opiate analgesic 06/25/2017   Lupus    Myocardial infarction (HCC)    Osteoarthritis    PAC (premature atrial contraction)    a.) Holter 12/04/19 --> occassional; 1.1% PAC burden.   Pain of right hip joint 11/22/2015   Paraesophageal hernia    PAT (paroxysmal atrial tachycardia) (HCC) 02/03/2020   Presbyesophagus    Primary osteoarthritis of left knee 02/04/2015   PSVT (paroxysmal supraventricular tachycardia) (HCC)    a.) Holter 12/04/19 --> 22 runs.   Renal calculus 12/21/2021   S/P repair of paraesophageal hernia 02/22/2021   S/P total knee arthroplasty 02/15/2015   Schatzki's ring     Sciatica 01/31/2022   Tobacco use 08/19/2019   Type 2 diabetes mellitus with other specified complication (HCC) 08/07/2019    Assessment: Patient Reported Symptoms:  Cognitive Cognitive Status: Alert and oriented to person, place, and time, Normal speech and language skills Cognitive/Intellectual Conditions Management [RPT]: None reported or documented in medical history or problem list      Neurological Neurological Review of Symptoms: No symptoms reported    HEENT HEENT Symptoms Reported: No symptoms reported      Cardiovascular Cardiovascular Symptoms Reported: No symptoms reported Cardiovascular Conditions: Coronary artery disease, Hypertension Cardiovascular Comment: Pt hasn't had a cigarette in over a month  Respiratory Respiratory Symptoms Reported: No symptoms reported Other Respiratory Symptoms: Pt reports breathing has gotten better since quiting smoking cigarettes    Endocrine Patient reports the following symptoms related to hypoglycemia or hyperglycemia : No symptoms reported    Gastrointestinal        Genitourinary Genitourinary Symptoms Reported: No symptoms reported    Integumentary Integumentary Symptoms Reported: Not assessed    Musculoskeletal Musculoskelatal Symptoms Reviewed: Difficulty walking, Weakness Additional Musculoskeletal Details: Difficulty walking due to right foot pain - pt has had appts with her surgeon and primary care office. Pt has started Musculoskeletal Conditions: Back pain, Other Other Musculoskeletal Conditions: Right foot pain Musculoskeletal Management Strategies: Coping strategies, Medication therapy Musculoskeletal Self-Management Outcome: 3 (uncertain)      Psychosocial Psychosocial Symptoms Reported: Depression - if selected complete PHQ  2-9 Behavioral Health Conditions: Depression Behavioral Management Strategies: Activity, Support system, Medication therapy, Coping strategies Behavioral Health Self-Management Outcome: 4  (good) Techniques to Cope with Loss/Stress/Change: Counseling, Diversional activities Quality of Family Relationships: helpful, involved, supportive Do you feel physically threatened by others?: No      11/09/2023    9:46 AM  Depression screen PHQ 2/9  Decreased Interest 1  Down, Depressed, Hopeless 1  PHQ - 2 Score 2  Altered sleeping 1  Tired, decreased energy 1  Change in appetite 0  Feeling bad or failure about yourself  0  Trouble concentrating 0  Moving slowly or fidgety/restless 0  Suicidal thoughts 0  PHQ-9 Score 4  Difficult doing work/chores Somewhat difficult    There were no vitals filed for this visit.  Medications Reviewed Today     Reviewed by Jens Molder, LCSW (Social Worker) on 11/09/23 at [phone]  Med List Status: <None>   Medication Order Taking? Sig Documenting Provider Last Dose Status Informant  albuterol  (PROVENTIL ) (2.5 MG/3ML) 0.083% nebulizer solution [id number] Yes Inhale 1 vial via nebulizer every 6 hours as needed for wheezing or shortness of breath Sirivol, Alla Ar, MD  Active Self  albuterol  (VENTOLIN  HFA) 108 (90 Base) MCG/ACT inhaler [id number] Yes Inhale 2 puffs by mouth every 6 hours as needed for shortness of breath or wheezing Sirivol, Mamatha, MD  Active Self  aspirin  EC 81 MG tablet [id number] Yes Take 1 tablet (81 mg total) by mouth daily. Swallow whole. Tobb, Kardie, DO  Active Self  budeson-glycopyrrolate -formoterol  (BREZTRI  AEROSPHERE) 160-9-4.8 MCG/ACT AERO [id number] Yes INHALE TWO (2) PUFFS BY MOUTH INTO THE LUNGS TWICE DAILY AS NEEDED FOR SHORTNESS OF BREATH Sirivol, Mamatha, MD  Active Self  buPROPion  (WELLBUTRIN  XL) 150 MG 24 hr tablet [id number] Yes Take 1 tablet by mouth every morning Sirivol, Alla Ar, MD  Active Self  cloNIDine  (CATAPRES ) 0.1 MG tablet [id number] Yes TAKE ONE (1) TABLET BY MOUTH TWICE DAILY Sirivol, Mamatha, MD  Active Self  hydrOXYzine  (VISTARIL ) 25 MG capsule [id number] Yes Take 1 capsule (25 mg total) by mouth every  8 (eight) hours as needed. Cox, Kirsten, MD  Active Self  ibuprofen  (ADVIL ) 800 MG tablet [id number] Yes Take 1 tablet (800 mg total) by mouth every 8 (eight) hours as needed. Sirivol, Mamatha, MD  Active   isosorbide  mononitrate (IMDUR ) 30 MG 24 hr tablet [id number] Yes Take 1 tablet by mouth every day Sirivol, Mamatha, MD  Active Self  lisinopril -hydrochlorothiazide  (ZESTORETIC ) 20-12.5 MG tablet [id number]  TAKE TWO (2) TABLETS BY MOUTH EVERY MORNING  Patient not taking: No sig reported   Sirivol, Mamatha, MD  Active Self  meclizine  (ANTIVERT ) 25 MG tablet [id number] Yes TAKE 1 TABLET BY MOUTH THREE TIMES DAILY AS NEEDED FOR DIZZINESS *REFILL REQUEST* Cox, Kirsten, MD  Active Self  nitroGLYCERIN  (NITROSTAT ) 0.4 MG SL tablet [id number]  Place 1 tablet (0.4 mg total) under the tongue every 5 (five) minutes as needed for chest pain.  Patient not taking: Reported on 11/08/2023   Sirivol, Mamatha, MD  Active Self           Med Note [facility], [place] A   Tue Oct 30, 2023  4:13 PM) Patient states she hasn't need this  omeprazole  (PRILOSEC) 40 MG capsule [id number] Yes TAKE 1 CAPSULE BY MOUTH EVERY MORNING Cox, Kirsten, MD  Active Self  oxyCODONE -acetaminophen  (PERCOCET) 10-325 MG tablet [id number] Yes Take 1 tablet by mouth every 8 (eight) hours as needed for moderate pain (pain score 4-6). Patient states now  every 8 hours per report by patient 10/30/23 [provider]  Active Self           Med Note Bernadene Brewer, RHONDA A   Tue Oct 30, 2023  4:14 PM) Patient states pain clinic gave her new prescription of 90 pills for this on 10/25/23  potassium chloride  (KLOR-CON  M) 10 MEQ tablet [id number]  TAKE 1 TABLET BY MOUTH ONCE DAILY  Patient not taking: Reported on 11/09/2023   Sirivol, Mamatha, MD  Active Self  promethazine  (PHENERGAN ) 25 MG tablet [id number] Yes Take 1 tablet by mouth every 6 hours as needed for nausea/vomiting. Sirivol, Mamatha, MD  Active   rosuvastatin  (CRESTOR ) 20 MG tablet [id number] Yes Take 1  tablet (20 mg total) by mouth at bedtime. Rhyne, Samantha J, PA-C  Active   traZODone  (DESYREL ) 100 MG tablet [id number] Yes TAKE 1 TABLET BY MOUTH DAILY AT BEDTIME Sirivol, Mamatha, MD  Active Self  Vitamin D , Ergocalciferol , (DRISDOL ) 1.25 MG (50000 UNIT) CAPS capsule [id number]  TAKE ONE CAPSULE BY MOUTH ONCE WEEKLY ON MONDAY  Patient not taking: No sig reported   Cox, Kirsten, MD  Active Self            Recommendation:   Continue to monitor for changes in mood and utilize coping skills as needed.  Follow Up Plan:   Patient has met all care management goals. Care Management case will be closed. Patient has been provided contact information should new needs arise.   Hale Level, LCSW [facility], [facility] Licensed Clinical Social Worker Care Coordinator [phone]

## 2023-11-12 NOTE — Assessment & Plan Note (Signed)
 Status post right iliofemoral endarterectomy and superficial femoral artery thrombectomy on right on Sep 28, 2023. Denies any significant concerns since his surgery except swelling in her right lower extremity. Continues to be consistent with her aspirin  81 mg daily, Crestor  20 mg daily.   Follows up with vascular surgery

## 2023-11-12 NOTE — Assessment & Plan Note (Signed)
 Appears to be postoperative edema versus dependent edema. She did have a vascular ultrasound which ruled out a DVT after her surgery.  Today on exam she does have trace to 1+ pedal edema in her right lower extremity compared to her left.  Plan: Since she has been having elevated blood pressures and the right lower extremity swelling ever since she has been taken off of her lisinopril  hydrochlorothiazide  combination pill, I will start her back on the medication.  I do not see any contraindications to doing this. Advised that she could even start with half a tablet daily  - To continue using compression stockings, elevate her legs when resting. - Restart lisinopril  20-hydrochlorothiazide  12.5 mg - Report back if no improvement

## 2023-11-12 NOTE — Assessment & Plan Note (Signed)
 She has several elevated blood pressure readings, I put her back on her home blood pressure pill which has been held since her hospital discharge in May.  Plan: Start taking lisinopril  20 mg-hydrochlorothiazide  12.5 mg. Monitor home blood pressures with an arm blood pressure cuff. Watch dietary sodium intake. Report back if blood pressures are still not controlled

## 2023-11-16 ENCOUNTER — Other Ambulatory Visit: Payer: Self-pay

## 2023-11-16 DIAGNOSIS — I10 Essential (primary) hypertension: Secondary | ICD-10-CM

## 2023-11-21 DIAGNOSIS — M47816 Spondylosis without myelopathy or radiculopathy, lumbar region: Secondary | ICD-10-CM | POA: Diagnosis not present

## 2023-11-21 DIAGNOSIS — I10 Essential (primary) hypertension: Secondary | ICD-10-CM | POA: Diagnosis not present

## 2023-11-21 DIAGNOSIS — M48061 Spinal stenosis, lumbar region without neurogenic claudication: Secondary | ICD-10-CM | POA: Diagnosis not present

## 2023-11-21 DIAGNOSIS — M51369 Other intervertebral disc degeneration, lumbar region without mention of lumbar back pain or lower extremity pain: Secondary | ICD-10-CM | POA: Diagnosis not present

## 2023-11-22 ENCOUNTER — Telehealth: Payer: Self-pay

## 2023-11-22 NOTE — Progress Notes (Signed)
 Complex Care Management Note  Care Guide Note 11/22/2023 Name: DEVLIN MCVEIGH MRN: [id number] DOB: September 21, 1945  Rock CHRISTELLA Lubeck is a 78 y.o. year old female who sees Sirivol, Mamatha, MD for primary care. I reached out to Rock CHRISTELLA Milholland by phone today to offer complex care management services.  Ms. Payes was given information about Complex Care Management services today including:   The Complex Care Management services include support from the care team which includes your Nurse Care Manager, Clinical Social Worker, or Pharmacist.  The Complex Care Management team is here to help remove barriers to the health concerns and goals most important to you. Complex Care Management services are voluntary, and the patient may decline or stop services at any time by request to their care team member.   Complex Care Management Consent Status: Patient agreed to services and verbal consent obtained.   Follow up plan:  Telephone appointment with complex care management team member scheduled for:  12/11/23 at 11:00 a.m.   Encounter Outcome:  Patient Scheduled  D[facility]  [facility], [facility] Care Management Assistant Direct Dial: [phone]  Fax: [phone]

## 2023-11-29 ENCOUNTER — Telehealth: Payer: Self-pay | Admitting: *Deleted

## 2023-11-29 NOTE — Telephone Encounter (Signed)
 Pt called triage with complaint of ongoing pain in her right foot that has worsen since her last visit. Pt states she has stopped smoking and has done/ continued recommended conservative therapy.  Pt denials discoloration Pt was advised to go to emergency department if pain become unbearable or symptoms worsen for example discoloration. Pt will call office if further questions .

## 2023-12-01 ENCOUNTER — Other Ambulatory Visit: Payer: Self-pay | Admitting: Family Medicine

## 2023-12-01 DIAGNOSIS — F322 Major depressive disorder, single episode, severe without psychotic features: Secondary | ICD-10-CM

## 2023-12-03 ENCOUNTER — Other Ambulatory Visit: Payer: Self-pay

## 2023-12-03 DIAGNOSIS — I739 Peripheral vascular disease, unspecified: Secondary | ICD-10-CM

## 2023-12-06 ENCOUNTER — Ambulatory Visit: Admitting: Cardiology

## 2023-12-10 ENCOUNTER — Other Ambulatory Visit: Payer: Self-pay

## 2023-12-10 DIAGNOSIS — I739 Peripheral vascular disease, unspecified: Secondary | ICD-10-CM

## 2023-12-11 ENCOUNTER — Telehealth: Payer: Self-pay

## 2023-12-17 ENCOUNTER — Encounter (HOSPITAL_COMMUNITY)

## 2023-12-17 ENCOUNTER — Encounter

## 2023-12-19 ENCOUNTER — Telehealth: Payer: Self-pay

## 2023-12-19 NOTE — Progress Notes (Signed)
 Complex Care Management Note Care Guide Note  12/19/2023 Name: [name] [name] MRN: [id number] DOB: 1946/02/10   Complex Care Management Outreach Attempts: An unsuccessful telephone outreach was attempted today to offer the patient information about available complex care management services.  Follow Up Plan:  Additional outreach attempts will be made to offer the patient complex care management information and services.   Encounter Outcome:  No Answer  D[facility]  [facility], [facility] Health Care Management Assistant Direct Dial: [phone]  Fax: [phone]

## 2023-12-24 NOTE — Progress Notes (Signed)
 Complex Care Management Note  Care Guide Note 12/24/2023 Name: [name] MRN: [id number] DOB: 1945-12-24  [name] [name] is a 78 y.o. year old female who sees Sirivol, Mamatha, MD for primary care. I reached out to [name] CHRISTELLA Thier by phone today to offer complex care management services.  [name] [name] was given information about Complex Care Management services today including:   The Complex Care Management services include support from the care team which includes your Nurse Care Manager, Clinical Social Worker, or Pharmacist.  The Complex Care Management team is here to help remove barriers to the health concerns and goals most important to you. Complex Care Management services are voluntary, and the patient may decline or stop services at any time by request to their care team member.   Complex Care Management Consent Status: Patient did not agree to participate in complex care management services at this time.  Follow up plan:  Patient stated she will follow up with PCP.   Encounter Outcome:  Patient Refused

## 2023-12-26 ENCOUNTER — Other Ambulatory Visit: Payer: Self-pay

## 2023-12-26 DIAGNOSIS — I10 Essential (primary) hypertension: Secondary | ICD-10-CM | POA: Diagnosis not present

## 2023-12-26 DIAGNOSIS — M48061 Spinal stenosis, lumbar region without neurogenic claudication: Secondary | ICD-10-CM | POA: Diagnosis not present

## 2023-12-26 DIAGNOSIS — M47816 Spondylosis without myelopathy or radiculopathy, lumbar region: Secondary | ICD-10-CM | POA: Diagnosis not present

## 2023-12-26 DIAGNOSIS — K219 Gastro-esophageal reflux disease without esophagitis: Secondary | ICD-10-CM | POA: Insufficient documentation

## 2023-12-26 DIAGNOSIS — M51369 Other intervertebral disc degeneration, lumbar region without mention of lumbar back pain or lower extremity pain: Secondary | ICD-10-CM | POA: Diagnosis not present

## 2023-12-27 ENCOUNTER — Ambulatory Visit: Attending: Cardiology | Admitting: Cardiology

## 2023-12-27 ENCOUNTER — Encounter: Payer: Self-pay | Admitting: Cardiology

## 2023-12-27 VITALS — BP 90/60 | HR 50 | Ht 62.0 in | Wt 127.0 lb

## 2023-12-27 DIAGNOSIS — F17200 Nicotine dependence, unspecified, uncomplicated: Secondary | ICD-10-CM | POA: Diagnosis not present

## 2023-12-27 DIAGNOSIS — I739 Peripheral vascular disease, unspecified: Secondary | ICD-10-CM | POA: Diagnosis not present

## 2023-12-27 DIAGNOSIS — I10 Essential (primary) hypertension: Secondary | ICD-10-CM

## 2023-12-27 DIAGNOSIS — I2510 Atherosclerotic heart disease of native coronary artery without angina pectoris: Secondary | ICD-10-CM | POA: Diagnosis not present

## 2023-12-27 DIAGNOSIS — I700 Atherosclerosis of aorta: Secondary | ICD-10-CM

## 2023-12-27 DIAGNOSIS — F17210 Nicotine dependence, cigarettes, uncomplicated: Secondary | ICD-10-CM | POA: Diagnosis not present

## 2023-12-27 DIAGNOSIS — Z720 Tobacco use: Secondary | ICD-10-CM

## 2023-12-27 NOTE — Patient Instructions (Signed)
 Please keep a BP log for 2 weeks and send by MyChart or mail.                         Dr. Edwyna [address] [place], [place] [address]  Blood Pressure Record Sheet To take your blood pressure, you will need a blood pressure machine. You can buy a blood pressure machine (blood pressure monitor) at your clinic, drug store, or online. When choosing one, consider: An automatic monitor that has an arm cuff. A cuff that wraps snugly around your upper arm. You should be able to fit only one finger between your arm and the cuff. A device that stores blood pressure reading results. Do not choose a monitor that measures your blood pressure from your wrist or finger. Follow your health care provider's instructions for how to take your blood pressure. To use this form: Get one reading in the morning (a.m.) 1-2 hours after you take any medicines. Get one reading in the evening (p.m.) before supper.   Blood pressure log Date: _______________________  a.m. _____________________(1st reading) HR___________            p.m. _____________________(2nd reading) HR__________  Date: _______________________  a.m. _____________________(1st reading) HR___________            p.m. _____________________(2nd reading) HR__________  Date: _______________________  a.m. _____________________(1st reading) HR___________            p.m. _____________________(2nd reading) HR__________  Date: _______________________  a.m. _____________________(1st reading) HR___________            p.m. _____________________(2nd reading) HR__________  Date: _______________________  a.m. _____________________(1st reading) HR___________            p.m. _____________________(2nd reading) HR__________  Date: _______________________  a.m. _____________________(1st reading) HR___________            p.m. _____________________(2nd reading) HR__________  Date: _______________________  a.m. _____________________(1st reading)  HR___________            p.m. _____________________(2nd reading) HR__________   This information is not intended to replace advice given to you by your health care provider. Make sure you discuss any questions you have with your health care provider. Document Revised: 09/03/2019 Document Reviewed: 09/03/2019 Elsevier Patient Education  2021 Elsevier Inc.   Medication Instructions:  Your physician recommends that you continue on your current medications as directed. Please refer to the Current Medication list given to you today.  *If you need a refill on your cardiac medications before your next appointment, please call your pharmacy*   Lab Work: None ordered If you have labs (blood work) drawn today and your tests are completely normal, you will receive your results only by: MyChart Message (if you have MyChart) OR A paper copy in the mail If you have any lab test that is abnormal or we need to change your treatment, we will call you to review the results.  Testing/Procedures: Your physician has requested that you have an echocardiogram. Echocardiography is a painless test that uses sound waves to create images of your heart. It provides your doctor with information about the size and shape of your heart and how well your heart's chambers and valves are working. This procedure takes approximately one hour. There are no restrictions for this procedure. Please do NOT wear cologne, perfume, aftershave, or lotions (deodorant is allowed). Please arrive 15 minutes prior to your appointment time.  Please note: We ask at that you not bring children with you during ultrasound (echo/  vascular) testing. Due to room size and safety concerns, children are not allowed in the ultrasound rooms during exams. Our front office staff cannot provide observation of children in our lobby area while testing is being conducted. An adult accompanying a patient to their appointment will only be allowed in the  ultrasound room at the discretion of the ultrasound technician under special circumstances. We apologize for any inconvenience.  Follow-Up: At [facility], you and your health needs are our priority.  As part of our continuing mission to provide you with exceptional heart care, we have created designated Provider Care Teams.  These Care Teams include your primary Cardiologist (physician) and Advanced Practice Providers (APPs -  Physician Assistants and Nurse Practitioners) who all work together to provide you with the care you need, when you need it.  We recommend signing up for the patient portal called MyChart.  Sign up information is provided on this After Visit Summary.  MyChart is used to connect with patients for Virtual Visits (Telemedicine).  Patients are able to view lab/test results, encounter notes, upcoming appointments, etc.  Non-urgent messages can be sent to your provider as well.   To learn more about what you can do with MyChart, go to [email or web address].    Your next appointment:   9 month(s)  The format for your next appointment:   In Person  Provider:   Jennifer Crape, MD   Other Instructions Echocardiogram An echocardiogram is a test that uses sound waves (ultrasound) to produce images of the heart. Images from an echocardiogram can provide important information about: Heart size and shape. The size and thickness and movement of your heart's walls. Heart muscle function and strength. Heart valve function or if you have stenosis. Stenosis is when the heart valves are too narrow. If blood is flowing backward through the heart valves (regurgitation). A tumor or infectious growth around the heart valves. Areas of heart muscle that are not working well because of poor blood flow or injury from a heart attack. Aneurysm detection. An aneurysm is a weak or damaged part of an artery wall. The wall bulges out from the normal force of blood pumping through the  body. Tell a health care provider about: Any allergies you have. All medicines you are taking, including vitamins, herbs, eye drops, creams, and over-the-counter medicines. Any blood disorders you have. Any surgeries you have had. Any medical conditions you have. Whether you are pregnant or may be pregnant. What are the risks? Generally, this is a safe test. However, problems may occur, including an allergic reaction to dye (contrast) that may be used during the test. What happens before the test? No specific preparation is needed. You may eat and drink normally. What happens during the test? You will take off your clothes from the waist up and put on a hospital gown. Electrodes or electrocardiogram (ECG)patches may be placed on your chest. The electrodes or patches are then connected to a device that monitors your heart rate and rhythm. You will lie down on a table for an ultrasound exam. A gel will be applied to your chest to help sound waves pass through your skin. A handheld device, called a transducer, will be pressed against your chest and moved over your heart. The transducer produces sound waves that travel to your heart and bounce back (or echo back) to the transducer. These sound waves will be captured in real-time and changed into images of your heart that can be viewed on a video monitor.  The images will be recorded on a computer and reviewed by your health care provider. You may be asked to change positions or hold your breath for a short time. This makes it easier to get different views or better views of your heart. In some cases, you may receive contrast through an IV in one of your veins. This can improve the quality of the pictures from your heart. The procedure may vary among health care providers and hospitals.   What can I expect after the test? You may return to your normal, everyday life, including diet, activities, and medicines, unless your health care provider tells  you not to do that. Follow these instructions at home: It is up to you to get the results of your test. Ask your health care provider, or the department that is doing the test, when your results will be ready. Keep all follow-up visits. This is important. Summary An echocardiogram is a test that uses sound waves (ultrasound) to produce images of the heart. Images from an echocardiogram can provide important information about the size and shape of your heart, heart muscle function, heart valve function, and other possible heart problems. You do not need to do anything to prepare before this test. You may eat and drink normally. After the echocardiogram is completed, you may return to your normal, everyday life, unless your health care provider tells you not to do that. This information is not intended to replace advice given to you by your health care provider. Make sure you discuss any questions you have with your health care provider. Document Revised: 01/06/2020 Document Reviewed: 01/06/2020 Elsevier Patient Education  2021 Elsevier Inc.   Important Information About Sugar

## 2023-12-27 NOTE — Progress Notes (Signed)
 Cardiology Office Note:    Date:  12/27/2023   ID:  [name], [name] 11-29-1945, MRN [id number]  PCP:  Sirivol, Mamatha, MD  Cardiologist:  Jennifer JONELLE Crape, MD   Referring MD: Sirivol, Mamatha, MD    ASSESSMENT:    1. Aortic atherosclerosis (HCC)   2. Coronary artery disease involving native coronary artery of native heart without angina pectoris   3. Tobacco use   4. Essential hypertension   5. PAD (peripheral artery disease) (HCC)    PLAN:    In order of problems listed above:  Coronary artery disease: Secondary prevention stressed with the patient.  Importance of compliance with diet medication stressed and patient verbalized standing.  She was advised to ambulate to the best of her ability. Peripheral vascular disease: Postsurgery.  Stable and asymptomatic.  Peripheral circulation is very diminished bilaterally. Cigarette smoker: I spent 5 minutes with the patient discussing solely about smoking. Smoking cessation was counseled. I suggested to the patient also different medications and pharmacological interventions. Patient is keen to try stopping on its own at this time. He will get back to me if he needs any further assistance in this matter. Mixed dyslipidemia: On lipid-lowering medications followed by primary care.  Lipids are at goal and I discussed the KPN sheet findings with her. Patient will be seen in follow-up appointment in 6 months or earlier if the patient has any concerns.    Medication Adjustments/Labs and Tests Ordered: Current medicines are reviewed at length with the patient today.  Concerns regarding medicines are outlined above.  No orders of the defined types were placed in this encounter.  No orders of the defined types were placed in this encounter.    Chief Complaint  Patient presents with   Annual Exam     History of Present Illness:    [name] [name] is a 78 y.o. female.  Patient has past medical history of aortic atherosclerosis, coronary  artery disease, essential hypertension mixed dyslipidemia and peripheral vascular disease.  She denies any problems at this time.  She has had bypass surgery on the right lower extremity.  It is recovering better.  She is walking with a walker.  Unfortunately she continues to smoke and using nicotine  patch to get off it.  At the time of my evaluation, the patient is alert awake oriented and in no distress.  Past Medical History:  Diagnosis Date   Acute cough 04/24/2023   Anxiety    Aortic atherosclerosis (HCC)    Ascending aorta dilation (HCC)    a.) TTE 09/02/19 --> mild; measured 39mm.   Asthma    Atherosclerosis of native artery of right lower extremity with rest pain (HCC) 10/04/2023   BMI 24.0-24.9, adult 02/14/2022   CAD (coronary artery disease)    a,) CTA 08/24/20 --> mild CAD (25-49%) in RCA, LAD, LCx; CAC/Agatston score 509.   Cataract    Chronic left shoulder pain 09/27/2015   Chronic neck pain 01/08/2023   Chronic pain syndrome 10/07/2022   Cigarette smoker 02/22/2022   COPD (chronic obstructive pulmonary disease) (HCC)    Coronary artery disease involving native coronary artery of native heart without angina pectoris 11/18/2020   Degenerative joint disease involving multiple joints 09/28/2017   Depression, major, recurrent, mild (HCC) 03/30/2020   Depression, major, single episode, severe (HCC) 03/30/2020   Diastolic dysfunction    a.) TTE 09/02/19 --> LVEF 60-65%; G2DD. b.) TTE 07/23/20 --> LVEF 70-75%; G1DD.   Edema of left lower extremity  09/20/2023   Enlarged aorta (HCC)    Essential hypertension 08/07/2019   Generalized osteoarthritis 09/28/2017   GERD (gastroesophageal reflux disease)    GERD with esophagitis 08/07/2019   Grief 10/03/2022   Hair loss 09/15/2022   History of kidney stones    Hospital discharge follow-up 10/11/2023   Hyperlipidemia    Hypertension    Hypertension associated with diabetes (HCC) 02/21/2022   Hypertensive heart disease 08/07/2019    Insomnia 04/24/2023   Left arm pain 09/27/2015   Long term (current) use of opiate analgesic 06/25/2017   Long-term current use of opiate analgesic 06/25/2017   Lumbar radiculopathy 07/01/2022   Lupus    Mild vitamin D  deficiency 10/03/2022   Neck pain 09/15/2022   Osteoarthritis    Other long term (current) drug therapy 06/04/2017   PAC (premature atrial contraction)    a.) Holter 12/04/19 --> occassional; 1.1% PAC burden.   PAD (peripheral artery disease) (HCC) 09/28/2023   Pain of right hip joint 11/22/2015   Paraesophageal hernia    PAT (paroxysmal atrial tachycardia) (HCC) 02/03/2020   Pedal edema 11/08/2023   Peripheral artery disease (HCC) 09/28/2023   Personal history of nicotine  dependence 10/03/2022   Presbyesophagus    Primary osteoarthritis of left knee 02/04/2015   PSVT (paroxysmal supraventricular tachycardia) (HCC)    a.) Holter 12/04/19 --> 22 runs.   Renal calculus 12/21/2021   S/P repair of paraesophageal hernia 02/22/2021   S/P total knee arthroplasty 02/15/2015   Schatzki's ring    Sciatica 01/31/2022   Spinal stenosis of lumbar region 07/01/2022   Temporal headache 09/15/2022   Tobacco use 08/19/2019   Type 2 diabetes mellitus with other circulatory complications (HCC) 08/14/2023   Uncontrolled hypertension, stage 1 11/08/2023   Vitamin D  deficiency 01/08/2023    Past Surgical History:  Procedure Laterality Date   ABDOMINAL AORTOGRAM W/LOWER EXTREMITY Bilateral 09/25/2023   Procedure: ABDOMINAL AORTOGRAM W/LOWER EXTREMITY;  Surgeon: Serene Gaile ORN, MD;  Location: [facility];  Service: Cardiovascular;  Laterality: Bilateral;   ABDOMINAL HYSTERECTOMY     BLADDER SUSPENSION     BREAST BIOPSY Right    COLONOSCOPY  09/04/2011   Colonic polyps, status post polyectomy. Incidental small ascending colon lipoma   ENDARTERECTOMY FEMORAL Right 09/28/2023   Procedure: ENDARTERECTOMY, FEMORAL;  Surgeon: Serene Gaile ORN, MD;  Location: [facility];  Service:  Vascular;  Laterality: Right;   ESOPHAGOGASTRODUODENOSCOPY  09/08/2015   Schatzki ring status post esophageal dilitation. Small hiatal hernia   hemorrhoid surgery     KNEE SURGERY     left   PATCH ANGIOPLASTY Right 09/28/2023   Procedure: RIGHT FEMORAL ANGIOPLASTY, USING GREATER SAPHENOUS VEIN;  Surgeon: Serene Gaile ORN, MD;  Location: [facility];  Service: Vascular;  Laterality: Right;   TOTAL KNEE ARTHROPLASTY Left 02/15/2015   Procedure: TOTAL KNEE ARTHROPLASTY;  Surgeon: Marcey Raman, MD;  Location: [facility];  Service: Orthopedics;  Laterality: Left;   TUBAL LIGATION     VEIN HARVEST Right 09/28/2023   Procedure: SURGICAL PROCUREMENT, RIGHT GREATER SAPHENOUS VEIN;  Surgeon: Serene Gaile ORN, MD;  Location: [facility];  Service: Vascular;  Laterality: Right;   WRIST SURGERY     right   XI ROBOTIC ASSISTED PARAESOPHAGEAL HERNIA REPAIR N/A 02/22/2021   Procedure: XI ROBOTIC ASSISTED PARAESOPHAGEAL HERNIA REPAIR with RNFA to assist;  Surgeon: Jordis Laneta FALCON, MD;  Location: [facility];  Service: General;  Laterality: N/A;    Current Medications: Current Meds  Medication Sig   albuterol  (PROVENTIL ) (  2.5 MG/3ML) 0.083% nebulizer solution Inhale 1 vial via nebulizer every 6 hours as needed for wheezing or shortness of breath   albuterol  (VENTOLIN  HFA) 108 (90 Base) MCG/ACT inhaler Inhale 2 puffs by mouth every 6 hours as needed for shortness of breath or wheezing   aspirin  EC 81 MG tablet Take 1 tablet (81 mg total) by mouth daily. Swallow whole.   budeson-glycopyrrolate -formoterol  (BREZTRI  AEROSPHERE) 160-9-4.8 MCG/ACT AERO INHALE TWO (2) PUFFS BY MOUTH INTO THE LUNGS TWICE DAILY AS NEEDED FOR SHORTNESS OF BREATH   buPROPion  (WELLBUTRIN  XL) 150 MG 24 hr tablet Take 1 tablet by mouth every morning   cloNIDine  (CATAPRES ) 0.1 MG tablet Take 2 tablets by mouth twice daily   ibuprofen  (ADVIL ) 800 MG tablet Take 1 tablet (800 mg total) by mouth every 8 (eight) hours as needed.   isosorbide  mononitrate (IMDUR ) 30 MG  24 hr tablet Take 1 tablet by mouth every day   lisinopril -hydrochlorothiazide  (ZESTORETIC ) 20-12.5 MG tablet TAKE TWO (2) TABLETS BY MOUTH EVERY MORNING   meclizine  (ANTIVERT ) 25 MG tablet TAKE 1 TABLET BY MOUTH THREE TIMES DAILY AS NEEDED FOR DIZZINESS *REFILL REQUEST*   nitroGLYCERIN  (NITROSTAT ) 0.4 MG SL tablet Place 1 tablet (0.4 mg total) under the tongue every 5 (five) minutes as needed for chest pain.   omeprazole  (PRILOSEC) 40 MG capsule Take 1 capsule by mouth every morning   oxyCODONE -acetaminophen  (PERCOCET) 10-325 MG tablet Take 1 tablet by mouth every 8 (eight) hours as needed for moderate pain (pain score 4-6). Patient states now every 8 hours per report by patient 10/30/23   potassium chloride  (KLOR-CON  M) 10 MEQ tablet TAKE 1 TABLET BY MOUTH ONCE DAILY   promethazine  (PHENERGAN ) 25 MG tablet Take 1 tablet by mouth every 6 hours as needed for nausea/vomiting.   rosuvastatin  (CRESTOR ) 20 MG tablet Take 1 tablet (20 mg total) by mouth at bedtime.   traZODone  (DESYREL ) 100 MG tablet TAKE 1 TABLET BY MOUTH DAILY AT BEDTIME     Allergies:   Meperidine, Meperidine hcl, Gabapentin , Influenza vaccines, and Other   Social History   Socioeconomic History   Marital status: Widowed    Spouse name: Not on file   Number of children: 4   Years of education: Not on file   Highest education level: Not on file  Occupational History   Occupation: Retired  Tobacco Use   Smoking status: Former    Current packs/day: 0.00    Average packs/day: 1 pack/day for 48.0 years (48.0 ttl pk-yrs)    Types: Cigarettes    Quit date: 09/28/2023    Years since quitting: 0.2   Smokeless tobacco: Never  Vaping Use   Vaping status: Never Used  Substance and Sexual Activity   Alcohol use: No   Drug use: No   Sexual activity: Not Currently  Other Topics Concern   Not on file  Social History Narrative   Not on file   Social Drivers of Health   Financial Resource Strain: Low Risk  (07/13/2023)   Overall  Financial Resource Strain (CARDIA)    Difficulty of Paying Living Expenses: Not very hard  Food Insecurity: No Food Insecurity (10/04/2023)   Hunger Vital Sign    Worried About Running Out of Food in the Last Year: Never true    Ran Out of Food in the Last Year: Never true  Transportation Needs: No Transportation Needs (10/04/2023)   PRAPARE - Transportation    Lack of Transportation (Medical): No    Lack of  Transportation (Non-Medical): No  Recent Concern: Transportation Needs - Unmet Transportation Needs (09/28/2023)   PRAPARE - Transportation    Lack of Transportation (Medical): Yes    Lack of Transportation (Non-Medical): Yes  Physical Activity: Inactive (08/01/2022)   Exercise Vital Sign    Days of Exercise per Week: 0 days    Minutes of Exercise per Session: 0 min  Stress: Stress Concern Present (08/01/2022)   [employer] of Occupational Health - Occupational Stress Questionnaire    Feeling of Stress : To some extent  Social Connections: Socially Isolated (09/28/2023)   Social Connection and Isolation Panel    Frequency of Communication with Friends and Family: More than three times a week    Frequency of Social Gatherings with Friends and Family: Twice a week    Attends Religious Services: Never    Database administrator or Organizations: No    Attends Banker Meetings: Never    Marital Status: Widowed     Family History: The patient's family history includes COPD in her father and son; Cancer in her father; Diabetes in her father; Heart disease in her father and mother; Lupus in her sister; Seizures in her son; Stroke in her father and mother. There is no history of Colon cancer, Esophageal cancer, Stomach cancer, or Rectal cancer.  ROS:   Please see the history of present illness.    All other systems reviewed and are negative.  EKGs/Labs/Other Studies Reviewed:    The following studies were reviewed today: .SABRA   I discussed my findings with the patient at  length   Recent Labs: 08/14/2023: TSH 1.030 09/18/2023: B Natriuretic Peptide 141.5 09/28/2023: ALT 12 10/01/2023: BUN 8; Creatinine, Ser 0.57; Hemoglobin 8.6; Platelets 130; Potassium 3.4; Sodium 135  Recent Lipid Panel    Component Value Date/Time   CHOL 96 09/29/2023 0449   CHOL 124 08/14/2023 1139   TRIG 32 09/29/2023 0449   HDL 57 09/29/2023 0449   HDL 56 08/14/2023 1139   CHOLHDL 1.7 09/29/2023 0449   VLDL 6 09/29/2023 0449   LDLCALC 33 09/29/2023 0449   LDLCALC 57 08/14/2023 1139    Physical Exam:    VS:  BP 90/60   Pulse (!) 50   Ht 5' 2 (1.575 m)   Wt 127 lb (57.6 kg)   SpO2 99%   BMI 23.23 kg/m     Wt Readings from Last 3 Encounters:  12/27/23 127 lb (57.6 kg)  11/08/23 131 lb (59.4 kg)  11/05/23 118 lb (53.5 kg)     GEN: Patient is in no acute distress HEENT: Normal NECK: No JVD; No carotid bruits LYMPHATICS: No lymphadenopathy CARDIAC: Hear sounds regular, 2/6 systolic murmur at the apex. RESPIRATORY:  Clear to auscultation without rales, wheezing or rhonchi  ABDOMEN: Soft, non-tender, non-distended MUSCULOSKELETAL:  No edema; No deformity  SKIN: Warm and dry NEUROLOGIC:  Alert and oriented x 3 PSYCHIATRIC:  Normal affect   Signed, Jennifer JONELLE Crape, MD  12/27/2023 11:47 AM    [facility]

## 2024-01-03 ENCOUNTER — Emergency Department (HOSPITAL_COMMUNITY)
Admission: EM | Admit: 2024-01-03 | Discharge: 2024-01-03 | Disposition: A | Discharge status: Home / Self Care | Attending: Emergency Medicine | Admitting: Emergency Medicine

## 2024-01-03 ENCOUNTER — Other Ambulatory Visit: Payer: Self-pay

## 2024-01-03 DIAGNOSIS — Z87891 Personal history of nicotine dependence: Secondary | ICD-10-CM | POA: Diagnosis not present

## 2024-01-03 DIAGNOSIS — Z7982 Long term (current) use of aspirin: Secondary | ICD-10-CM | POA: Diagnosis not present

## 2024-01-03 DIAGNOSIS — Z9889 Other specified postprocedural states: Secondary | ICD-10-CM | POA: Diagnosis not present

## 2024-01-03 DIAGNOSIS — R202 Paresthesia of skin: Secondary | ICD-10-CM | POA: Diagnosis not present

## 2024-01-03 DIAGNOSIS — R531 Weakness: Secondary | ICD-10-CM | POA: Diagnosis not present

## 2024-01-03 DIAGNOSIS — M79673 Pain in unspecified foot: Secondary | ICD-10-CM | POA: Diagnosis not present

## 2024-01-03 DIAGNOSIS — I10 Essential (primary) hypertension: Secondary | ICD-10-CM | POA: Diagnosis not present

## 2024-01-03 DIAGNOSIS — M79671 Pain in right foot: Secondary | ICD-10-CM | POA: Insufficient documentation

## 2024-01-03 MED ORDER — METHYLPREDNISOLONE 4 MG PO TBPK
ORAL_TABLET | ORAL | 0 refills | Status: DC
Start: 2024-01-03 — End: 2024-02-12

## 2024-01-03 NOTE — Consult Note (Signed)
 [facility] [place]  ASSESSMENT / PLAN: 78 y.o. female with right foot pain after iliofemoral endarterectomy 09/28/23 for rest pain. She has a normal vascular exam today in the ER. Suspect she has another cause for pain in her foot. She should continue outpatient surveillance for known PAD.  CHIEF COMPLAINT: right foot pain  HISTORY OF PRESENT ILLNESS: [name] [name] is a 78 y.o. female well-known to our service having undergone a right iliofemoral endarterectomy on Sep 28, 2023 for ischemic rest pain with Dr. Serene.  She was directed to present to the ER today by her primary care physician for complaints about generally feeling unwell and right foot paresthesias and pain.  Her neurologic complaints have been present since about a month before surgery.  She has not seen much improvement in her foot.  She has a reassuring exam today and no worrisome features of progressive peripheral arterial disease.  Past Medical History:  Diagnosis Date   Acute cough 04/24/2023   Anxiety    Aortic atherosclerosis (HCC)    Ascending aorta dilation (HCC)    a.) TTE 09/02/19 --> mild; measured 39mm.   Asthma    Atherosclerosis of native artery of right lower extremity with rest pain (HCC) 10/04/2023   BMI 24.0-24.9, adult 02/14/2022   CAD (coronary artery disease)    a,) CTA 08/24/20 --> mild CAD (25-49%) in RCA, LAD, LCx; CAC/Agatston score 509.   Cataract    Chronic left shoulder pain 09/27/2015   Chronic neck pain 01/08/2023   Chronic pain syndrome 10/07/2022   Cigarette smoker 02/22/2022   COPD (chronic obstructive pulmonary disease) (HCC)    Coronary artery disease involving native coronary artery of native heart without angina pectoris 11/18/2020   Degenerative joint disease involving multiple joints 09/28/2017   Depression, major, recurrent, mild (HCC) 03/30/2020   Depression, major, single episode, severe (HCC) 03/30/2020   Diastolic dysfunction    a.) TTE 09/02/19 -->  LVEF 60-65%; G2DD. b.) TTE 07/23/20 --> LVEF 70-75%; G1DD.   Edema of left lower extremity 09/20/2023   Enlarged aorta (HCC)    Essential hypertension 08/07/2019   Generalized osteoarthritis 09/28/2017   GERD (gastroesophageal reflux disease)    GERD with esophagitis 08/07/2019   Grief 10/03/2022   Hair loss 09/15/2022   History of kidney stones    Hospital discharge follow-up 10/11/2023   Hyperlipidemia    Hypertension    Hypertension associated with diabetes (HCC) 02/21/2022   Hypertensive heart disease 08/07/2019   Insomnia 04/24/2023   Left arm pain 09/27/2015   Long term (current) use of opiate analgesic 06/25/2017   Long-term current use of opiate analgesic 06/25/2017   Lumbar radiculopathy 07/01/2022   Lupus    Mild vitamin D  deficiency 10/03/2022   Neck pain 09/15/2022   Osteoarthritis    Other long term (current) drug therapy 06/04/2017   PAC (premature atrial contraction)    a.) Holter 12/04/19 --> occassional; 1.1% PAC burden.   PAD (peripheral artery disease) (HCC) 09/28/2023   Pain of right hip joint 11/22/2015   Paraesophageal hernia    PAT (paroxysmal atrial tachycardia) (HCC) 02/03/2020   Pedal edema 11/08/2023   Peripheral artery disease (HCC) 09/28/2023   Personal history of nicotine  dependence 10/03/2022   Presbyesophagus    Primary osteoarthritis of left knee 02/04/2015   PSVT (paroxysmal supraventricular tachycardia) (HCC)    a.) Holter 12/04/19 --> 22 runs.   Renal calculus 12/21/2021   S/P repair of paraesophageal hernia 02/22/2021   S/P total  knee arthroplasty 02/15/2015   Schatzki's ring    Sciatica 01/31/2022   Spinal stenosis of lumbar region 07/01/2022   Temporal headache 09/15/2022   Tobacco use 08/19/2019   Type 2 diabetes mellitus with other circulatory complications (HCC) 08/14/2023   Uncontrolled hypertension, stage 1 11/08/2023   Vitamin D  deficiency 01/08/2023    Past Surgical History:  Procedure Laterality Date   ABDOMINAL  AORTOGRAM W/LOWER EXTREMITY Bilateral 09/25/2023   Procedure: ABDOMINAL AORTOGRAM W/LOWER EXTREMITY;  Surgeon: Serene Gaile ORN, MD;  Location: [facility];  Service: Cardiovascular;  Laterality: Bilateral;   ABDOMINAL HYSTERECTOMY     BLADDER SUSPENSION     BREAST BIOPSY Right    COLONOSCOPY  09/04/2011   Colonic polyps, status post polyectomy. Incidental small ascending colon lipoma   ENDARTERECTOMY FEMORAL Right 09/28/2023   Procedure: ENDARTERECTOMY, FEMORAL;  Surgeon: Serene Gaile ORN, MD;  Location: [facility] OR;  Service: Vascular;  Laterality: Right;   ESOPHAGOGASTRODUODENOSCOPY  09/08/2015   Schatzki ring status post esophageal dilitation. Small hiatal hernia   hemorrhoid surgery     KNEE SURGERY     left   PATCH ANGIOPLASTY Right 09/28/2023   Procedure: RIGHT FEMORAL ANGIOPLASTY, USING GREATER SAPHENOUS VEIN;  Surgeon: Serene Gaile ORN, MD;  Location: [facility] OR;  Service: Vascular;  Laterality: Right;   TOTAL KNEE ARTHROPLASTY Left 02/15/2015   Procedure: TOTAL KNEE ARTHROPLASTY;  Surgeon: Marcey Raman, MD;  Location: [facility];  Service: Orthopedics;  Laterality: Left;   TUBAL LIGATION     VEIN HARVEST Right 09/28/2023   Procedure: SURGICAL PROCUREMENT, RIGHT GREATER SAPHENOUS VEIN;  Surgeon: Serene Gaile ORN, MD;  Location: [facility];  Service: Vascular;  Laterality: Right;   WRIST SURGERY     right   XI ROBOTIC ASSISTED PARAESOPHAGEAL HERNIA REPAIR N/A 02/22/2021   Procedure: XI ROBOTIC ASSISTED PARAESOPHAGEAL HERNIA REPAIR with RNFA to assist;  Surgeon: Jordis Laneta FALCON, MD;  Location: [facility];  Service: General;  Laterality: N/A;    Family History  Problem Relation Age of Onset   Stroke Mother    Heart disease Mother    COPD Father    Cancer Father        Bone   Diabetes Father    Heart disease Father    Stroke Father    Lupus Sister    Seizures Son    COPD Son    Colon cancer Neg Hx    Esophageal cancer Neg Hx    Stomach cancer Neg Hx    Rectal cancer Neg Hx     Social History    Socioeconomic History   Marital status: Widowed    Spouse name: Not on file   Number of children: 4   Years of education: Not on file   Highest education level: Not on file  Occupational History   Occupation: Retired  Tobacco Use   Smoking status: Former    Current packs/day: 0.00    Average packs/day: 1 pack/day for 48.0 years (48.0 ttl pk-yrs)    Types: Cigarettes    Quit date: 09/28/2023    Years since quitting: 0.2   Smokeless tobacco: Never  Vaping Use   Vaping status: Never Used  Substance and Sexual Activity   Alcohol use: No   Drug use: No   Sexual activity: Not Currently  Other Topics Concern   Not on file  Social History Narrative   Not on file   Social Drivers of Health   Financial Resource Strain: Low Risk  (07/13/2023)  Overall Financial Resource Strain (CARDIA)    Difficulty of Paying Living Expenses: Not very hard  Food Insecurity: No Food Insecurity (10/04/2023)   Hunger Vital Sign    Worried About Running Out of Food in the Last Year: Never true    Ran Out of Food in the Last Year: Never true  Transportation Needs: No Transportation Needs (10/04/2023)   PRAPARE - Administrator, Civil Service (Medical): No    Lack of Transportation (Non-Medical): No  Recent Concern: Transportation Needs - Unmet Transportation Needs (09/28/2023)   PRAPARE - Transportation    Lack of Transportation (Medical): Yes    Lack of Transportation (Non-Medical): Yes  Physical Activity: Inactive (08/01/2022)   Exercise Vital Sign    Days of Exercise per Week: 0 days    Minutes of Exercise per Session: 0 min  Stress: Stress Concern Present (08/01/2022)   [employer] of Occupational Health - Occupational Stress Questionnaire    Feeling of Stress : To some extent  Social Connections: Socially Isolated (09/28/2023)   Social Connection and Isolation Panel    Frequency of Communication with Friends and Family: More than three times a week    Frequency of Social Gatherings  with Friends and Family: Twice a week    Attends Religious Services: Never    Database administrator or Organizations: No    Attends Banker Meetings: Never    Marital Status: Widowed  Intimate Partner Violence: Not At Risk (10/04/2023)   Humiliation, Afraid, Rape, and Kick questionnaire    Fear of Current or Ex-Partner: No    Emotionally Abused: No    Physically Abused: No    Sexually Abused: No    Allergies  Allergen Reactions   Meperidine Anaphylaxis and Other (See Comments)    Blood pressure dropped, also   Meperidine Hcl Anaphylaxis and Other (See Comments)    B/P dropped, also   Gabapentin  Nausea And Vomiting        Influenza Vaccines Other (See Comments)    Flu-like symptoms - patient states that she can tolerate this    Other Rash and Other (See Comments)    asparagus    No current facility-administered medications for this encounter.   Current Outpatient Medications  Medication Sig Dispense Refill   methylPREDNISolone  (MEDROL  DOSEPAK) 4 MG TBPK tablet Day 1: 8mg  before breakfast, 4 mg after lunch, 4 mg after supper, and 8 mg at bedtime Day 2: 4 mg before breakfast, 4 mg after lunch, 4 mg  after supper, and 8 mg  at bedtime Day 3:  4 mg  before breakfast, 4 mg  after lunch, 4 mg after supper, and 4 mg  at bedtime Day 4: 4 mg  before breakfast, 4 mg  after lunch, and 4 mg at bedtime Day 5: 4 mg  before breakfast and 4 mg at bedtime Day 6: 4 mg  before breakfast 1 each 0   albuterol  (PROVENTIL ) (2.5 MG/3ML) 0.083% nebulizer solution Inhale 1 vial via nebulizer every 6 hours as needed for wheezing or shortness of breath 90 mL 11   albuterol  (VENTOLIN  HFA) 108 (90 Base) MCG/ACT inhaler Inhale 2 puffs by mouth every 6 hours as needed for shortness of breath or wheezing 6.7 each 11   aspirin  EC 81 MG tablet Take 1 tablet (81 mg total) by mouth daily. Swallow whole. 90 tablet 3   budeson-glycopyrrolate -formoterol  (BREZTRI  AEROSPHERE) 160-9-4.8 MCG/ACT AERO INHALE TWO  (2) PUFFS BY MOUTH INTO THE LUNGS TWICE DAILY  AS NEEDED FOR SHORTNESS OF BREATH 10.7 g 10   buPROPion  (WELLBUTRIN  XL) 150 MG 24 hr tablet Take 1 tablet by mouth every morning 30 tablet 11   cloNIDine  (CATAPRES ) 0.1 MG tablet Take 2 tablets by mouth twice daily 120 tablet 11   ibuprofen  (ADVIL ) 800 MG tablet Take 1 tablet (800 mg total) by mouth every 8 (eight) hours as needed. 90 tablet 1   isosorbide  mononitrate (IMDUR ) 30 MG 24 hr tablet Take 1 tablet by mouth every day 30 tablet 11   lisinopril -hydrochlorothiazide  (ZESTORETIC ) 20-12.5 MG tablet TAKE TWO (2) TABLETS BY MOUTH EVERY MORNING 60 tablet 10   meclizine  (ANTIVERT ) 25 MG tablet TAKE 1 TABLET BY MOUTH THREE TIMES DAILY AS NEEDED FOR DIZZINESS *REFILL REQUEST* 180 tablet 1   nitroGLYCERIN  (NITROSTAT ) 0.4 MG SL tablet Place 1 tablet (0.4 mg total) under the tongue every 5 (five) minutes as needed for chest pain. 30 tablet 1   omeprazole  (PRILOSEC) 40 MG capsule Take 1 capsule by mouth every morning 30 capsule 2   oxyCODONE -acetaminophen  (PERCOCET) 10-325 MG tablet Take 1 tablet by mouth every 8 (eight) hours as needed for moderate pain (pain score 4-6). Patient states now every 8 hours per report by patient 10/30/23     potassium chloride  (KLOR-CON  M) 10 MEQ tablet TAKE 1 TABLET BY MOUTH ONCE DAILY 30 tablet 10   promethazine  (PHENERGAN ) 25 MG tablet Take 1 tablet by mouth every 6 hours as needed for nausea/vomiting. 120 tablet 11   rosuvastatin  (CRESTOR ) 20 MG tablet Take 1 tablet (20 mg total) by mouth at bedtime. 90 tablet 1   traZODone  (DESYREL ) 100 MG tablet TAKE 1 TABLET BY MOUTH DAILY AT BEDTIME 30 tablet 10    PHYSICAL EXAM Vitals:   01/03/24 1421 01/03/24 1427  BP: (!) 171/96   Pulse: 64   Resp: 16   Temp: 98.2 F (36.8 C)   TempSrc: Oral   SpO2: 100%   Weight:  57.6 kg  Height:  5' 2 (1.575 m)   Elderly woman in no distress Regular rate and rhythm Unlabored breathing 2+ femoral pulses Well-healed right groin  incision 2+ right dorsalis pedis pulse 1+ left dorsalis pedis pulse  PERTINENT LABORATORY AND RADIOLOGIC DATA  Most recent CBC    Latest Ref Rng & Units 10/01/2023    4:09 AM 09/30/2023    8:27 AM 09/29/2023    4:49 AM  CBC  WBC 4.0 - 10.5 K/uL 5.0  5.3  6.4   Hemoglobin 12.0 - 15.0 g/dL 8.6  9.3  9.4   Hematocrit 36.0 - 46.0 % 25.5  27.2  27.7   Platelets 150 - 400 K/uL 130  122  132      Most recent CMP    Latest Ref Rng & Units 10/01/2023    4:09 AM 09/30/2023    8:27 AM 09/29/2023    4:49 AM  CMP  Glucose 70 - 99 mg/dL 95  94  93   BUN 8 - 23 mg/dL 8  7  6    Creatinine 0.44 - 1.00 mg/dL 9.42  9.41  9.34   Sodium 135 - 145 mmol/L 135  136  134   Potassium 3.5 - 5.1 mmol/L 3.4  3.7  3.4   Chloride 98 - 111 mmol/L 102  102  98   CO2 22 - 32 mmol/L 29  28  28    Calcium  8.9 - 10.3 mg/dL 8.4  8.5  8.5     Renal function CrCl  cannot be calculated (Patient's most recent lab result is older than the maximum 21 days allowed.).  Hgb A1c MFr Bld (%)  Date Value  08/14/2023 5.3    LDL Chol Calc (NIH)  Date Value Ref Range Status  08/14/2023 57 0 - 99 mg/dL Final   LDL Cholesterol  Date Value Ref Range Status  09/29/2023 33 0 - 99 mg/dL Final    Comment:           Total Cholesterol/HDL:CHD Risk Coronary Heart Disease Risk Table                     Men   Women  1/2 Average Risk   3.4   3.3  Average Risk       5.0   4.4  2 X Average Risk   9.6   7.1  3 X Average Risk  23.4   11.0        Use the calculated Patient Ratio above and the CHD Risk Table to determine the patient's CHD Risk.        ATP III CLASSIFICATION (LDL):  <100     mg/dL   Optimal  899-870  mg/dL   Near or Above                    Optimal  130-159  mg/dL   Borderline  839-810  mg/dL   High  >809     mg/dL   Very High Performed at [facility], [address] [place], [place] [address]     Debby SAILOR. Magda, MD FACS [facility] Phone Number: [phone] 01/03/2024 4:24 PM   Total time spent on preparing this encounter including chart review, data review, collecting history, examining the patient, and coordinating care: 45 minutes  Portions of this report may have been transcribed using voice recognition software.  Every effort has been made to ensure accuracy; however, inadvertent computerized transcription errors may still be present.

## 2024-01-03 NOTE — ED Notes (Signed)
 Bil feet pulses present with doppler verification.

## 2024-01-03 NOTE — ED Provider Notes (Signed)
 [place] EMERGENCY DEPARTMENT AT [facility] Provider Note   CSN: [id number] Arrival date & time: 01/03/24  1416     Patient presents with: Foot Pain   [name] [name] is a 78 y.o. female.   78 yo F with a cc of right foot pain.  Going on for some time now, but started having some tingling to to the top of the foot for the past 48 hours or so.  Nothing seems to make it better or worse.  Has had pain to the bottom of the foot, worse with ambulation.  Going on since before her vascular procedure.  Denies injury.  Has had some pain to the medial thigh and feels like she feels a knot.  Said her daughter called the vascular clinic but isnt sure of the outcome of that call.    Foot Pain       Prior to Admission medications   Medication Sig Start Date End Date Taking? Authorizing Provider  methylPREDNISolone  (MEDROL  DOSEPAK) 4 MG TBPK tablet Day 1: 8mg  before breakfast, 4 mg after lunch, 4 mg after supper, and 8 mg at bedtime Day 2: 4 mg before breakfast, 4 mg after lunch, 4 mg  after supper, and 8 mg  at bedtime Day 3:  4 mg  before breakfast, 4 mg  after lunch, 4 mg after supper, and 4 mg  at bedtime Day 4: 4 mg  before breakfast, 4 mg  after lunch, and 4 mg at bedtime Day 5: 4 mg  before breakfast and 4 mg at bedtime Day 6: 4 mg  before breakfast 01/03/24  Yes Ganon Demasi, DO  albuterol  (PROVENTIL ) (2.5 MG/3ML) 0.083% nebulizer solution Inhale 1 vial via nebulizer every 6 hours as needed for wheezing or shortness of breath 08/23/23   Sirivol, Mamatha, MD  albuterol  (VENTOLIN  HFA) 108 (90 Base) MCG/ACT inhaler Inhale 2 puffs by mouth every 6 hours as needed for shortness of breath or wheezing 08/23/23   Sirivol, Mamatha, MD  aspirin  EC 81 MG tablet Take 1 tablet (81 mg total) by mouth daily. Swallow whole. 03/04/21   Tobb, Kardie, DO  budeson-glycopyrrolate -formoterol  (BREZTRI  AEROSPHERE) 160-9-4.8 MCG/ACT AERO INHALE TWO (2) PUFFS BY MOUTH INTO THE LUNGS TWICE DAILY AS NEEDED FOR SHORTNESS  OF BREATH 08/20/23   Sirivol, Mamatha, MD  buPROPion  (WELLBUTRIN  XL) 150 MG 24 hr tablet Take 1 tablet by mouth every morning 09/17/23   Sirivol, Mamatha, MD  cloNIDine  (CATAPRES ) 0.1 MG tablet Take 2 tablets by mouth twice daily 11/16/23   Sirivol, Mamatha, MD  ibuprofen  (ADVIL ) 800 MG tablet Take 1 tablet (800 mg total) by mouth every 8 (eight) hours as needed. 10/11/23   Sirivol, Mamatha, MD  isosorbide  mononitrate (IMDUR ) 30 MG 24 hr tablet Take 1 tablet by mouth every day 08/16/23   Sirivol, Mamatha, MD  lisinopril -hydrochlorothiazide  (ZESTORETIC ) 20-12.5 MG tablet TAKE TWO (2) TABLETS BY MOUTH EVERY MORNING 07/31/23   Sirivol, Mamatha, MD  meclizine  (ANTIVERT ) 25 MG tablet TAKE 1 TABLET BY MOUTH THREE TIMES DAILY AS NEEDED FOR DIZZINESS *REFILL REQUEST* 01/27/23   Cox, Abigail, MD  nitroGLYCERIN  (NITROSTAT ) 0.4 MG SL tablet Place 1 tablet (0.4 mg total) under the tongue every 5 (five) minutes as needed for chest pain. 04/16/23   Sirivol, Mamatha, MD  omeprazole  (PRILOSEC) 40 MG capsule Take 1 capsule by mouth every morning 12/02/23   Sirivol, Mamatha, MD  oxyCODONE -acetaminophen  (PERCOCET) 10-325 MG tablet Take 1 tablet by mouth every 8 (eight) hours as needed for  moderate pain (pain score 4-6). Patient states now every 8 hours per report by patient 10/30/23 12/29/22   [provider]  potassium chloride  (KLOR-CON  M) 10 MEQ tablet TAKE 1 TABLET BY MOUTH ONCE DAILY 07/31/23   Sirivol, Mamatha, MD  promethazine  (PHENERGAN ) 25 MG tablet Take 1 tablet by mouth every 6 hours as needed for nausea/vomiting. 10/17/23   Sirivol, Mamatha, MD  rosuvastatin  (CRESTOR ) 20 MG tablet Take 1 tablet (20 mg total) by mouth at bedtime. 10/02/23   Rhyne, Samantha J, PA-C  traZODone  (DESYREL ) 100 MG tablet TAKE 1 TABLET BY MOUTH DAILY AT BEDTIME 07/31/23   Sirivol, Mamatha, MD    Allergies: Meperidine, Meperidine hcl, Gabapentin , Influenza vaccines, and Other    Review of Systems  Updated Vital Signs BP (!) 171/96   Pulse  64   Temp 98.2 F (36.8 C) (Oral)   Resp 16   Ht 5' 2 (1.575 m)   Wt 57.6 kg   SpO2 100%   BMI 23.23 kg/m   Physical Exam Vitals and nursing note reviewed.  Constitutional:      General: She is not in acute distress.    Appearance: She is well-developed. She is not diaphoretic.  HENT:     Head: Normocephalic and atraumatic.  Eyes:     Pupils: Pupils are equal, round, and reactive to light.  Cardiovascular:     Rate and Rhythm: Normal rate and regular rhythm.     Heart sounds: No murmur heard.    No friction rub. No gallop.  Pulmonary:     Effort: Pulmonary effort is normal.     Breath sounds: No wheezing or rales.  Abdominal:     General: There is no distension.     Palpations: Abdomen is soft.     Tenderness: There is no abdominal tenderness.  Musculoskeletal:        General: No tenderness.     Cervical back: Normal range of motion and neck supple.     Comments: PMS intact to the RLE.  Cap refill maybe slightly diminished about 3 secs.    Skin:    General: Skin is warm and dry.  Neurological:     Mental Status: She is alert and oriented to person, place, and time.  Psychiatric:        Behavior: Behavior normal.     (all labs ordered are listed, but only abnormal results are displayed) Labs Reviewed - No data to display  EKG: EKG Interpretation Date/Time:  Thursday January 03 2024 14:25:28 EDT Ventricular Rate:  60 PR Interval:  166 QRS Duration:  84 QT Interval:  406 QTC Calculation: 406 R Axis:   38  Text Interpretation: Normal sinus rhythm Normal ECG No significant change since last tracing Confirmed by Emil Share [phone]) on 01/03/2024 3:13:44 PM  Radiology: No results found.   Procedures   Medications Ordered in the ED - No data to display                                  Medical Decision Making Risk Prescription drug management.   78 yo F with a cc of right foot pain.  Patient with hx of R femoral enterectomy in may.  Has had pain to the  plantar aspect since even before surgery.  Denies improvement post surgery.  Pain has persisted, stated having tingling to dorsal aspect of the foot over the past couple of days.  No obvious ischemia on exam, palpable pulses.  Will discuss with vascular.   Patient was seen by vascular surgery Dr. Magda, thought symptoms unlikely to be related to prior limb ischemia.  Trial of Medrol  Dosepak for possible radiculopathy.  PCP follow-up.  4:00 PM:  I have discussed the diagnosis/risks/treatment options with the patient.  Evaluation and diagnostic testing in the emergency department does not suggest an emergent condition requiring admission or immediate intervention beyond what has been performed at this time.  They will follow up with PCP, vascular. We also discussed returning to the ED immediately if new or worsening sx occur. We discussed the sx which are most concerning (e.g., sudden worsening pain, fever, inability to tolerate by mouth) that necessitate immediate return. Medications administered to the patient during their visit and any new prescriptions provided to the patient are listed below.  Medications given during this visit Medications - No data to display   The patient appears reasonably screen and/or stabilized for discharge and I doubt any other medical condition or other [facility] requiring further screening, evaluation, or treatment in the ED at this time prior to discharge.       Final diagnoses:  Foot pain, right    ED Discharge Orders          Ordered    methylPREDNISolone  (MEDROL  DOSEPAK) 4 MG TBPK tablet        01/03/24 1557               Emil Share, DO 01/03/24 1600

## 2024-01-03 NOTE — Discharge Instructions (Signed)
 Follow up with your family doc and vascular surgeon in the office.  I have started you on some steroids to see if they help.

## 2024-01-03 NOTE — ED Triage Notes (Signed)
 Pt BIB [facility] from home d/t her PCP recommendation since she continues to have swelling/tingling in her Rt foot. Does have Hx of blockage in that leg & had a bypass/stent done this past May. A/Ox4, Not on thinners, c/o 5/10 pain in that Rt foot, Endorses a small amt of hematuria as well the past 2 weeks & has not felt very well d/t generalized weakness the past 3 days. Hx of COPD, HTN, clot in Rt leg & chronic back pain. 180/70, NSR 66 bpm, 100 on RA, LS clear, 18g Lt AC PIV.

## 2024-01-15 ENCOUNTER — Other Ambulatory Visit

## 2024-01-16 ENCOUNTER — Telehealth: Payer: Self-pay

## 2024-01-16 ENCOUNTER — Ambulatory Visit: Attending: Cardiology

## 2024-01-16 DIAGNOSIS — I2510 Atherosclerotic heart disease of native coronary artery without angina pectoris: Secondary | ICD-10-CM | POA: Diagnosis not present

## 2024-01-16 LAB — ECHOCARDIOGRAM COMPLETE
AR max vel: 1.95 cm2
AV Area VTI: 2.05 cm2
AV Area mean vel: 1.87 cm2
AV Mean grad: 4 mmHg
AV Peak grad: 8 mmHg
Ao pk vel: 1.42 m/s
Area-P 1/2: 2.93 cm2
MV VTI: 1.11 cm2
S' Lateral: 3.1 cm

## 2024-01-16 NOTE — Telephone Encounter (Signed)
 Copied from CRM [id number]. Topic: Clinical - Medical Advice >> Jan 16, 2024 12:24 PM [name] [name] wrote: Reason for CRM: Pt wants to have DNR paperwork completed with her PCP. And she wants to have copies to provide for her children. Wants Do not resuscitate paperwork completed.   Please advise  Wants to have this during her appt in September.

## 2024-01-23 ENCOUNTER — Telehealth: Payer: Self-pay

## 2024-01-23 DIAGNOSIS — Z79891 Long term (current) use of opiate analgesic: Secondary | ICD-10-CM | POA: Diagnosis not present

## 2024-01-23 DIAGNOSIS — G894 Chronic pain syndrome: Secondary | ICD-10-CM | POA: Diagnosis not present

## 2024-01-23 DIAGNOSIS — M48061 Spinal stenosis, lumbar region without neurogenic claudication: Secondary | ICD-10-CM | POA: Diagnosis not present

## 2024-01-23 DIAGNOSIS — M47816 Spondylosis without myelopathy or radiculopathy, lumbar region: Secondary | ICD-10-CM | POA: Diagnosis not present

## 2024-01-23 DIAGNOSIS — I10 Essential (primary) hypertension: Secondary | ICD-10-CM | POA: Diagnosis not present

## 2024-01-23 NOTE — Telephone Encounter (Signed)
 Spoke with patient, let patient know Dr. Edwyna will be in touch with her with these results as he ordered the test. Patient verbalized understanding and had no questions at this time.   Copied from CRM [id number]. Topic: Clinical - Lab/Test Results >> Jan 22, 2024  1:37 PM Deleta RAMAN wrote: Reason for CRM: patient is calling regarding ECHOCARDIOGRAM COMPLETE results please contact patient at [phone]. Patient aware of call back time. She would also like to know if her blood pressure medicine would need to be switched out due to this matter.

## 2024-01-31 NOTE — Telephone Encounter (Signed)
 Hello, Do you have the results of Echocardiogram? Thank you  Copied from CRM [id number]. Topic: General - Other >> Jan 31, 2024 11:04 AM [name] [name] wrote: Reason for CRM: [facility] [place] [facility] and is concerned

## 2024-02-04 ENCOUNTER — Ambulatory Visit

## 2024-02-04 ENCOUNTER — Encounter (HOSPITAL_COMMUNITY)

## 2024-02-04 NOTE — Telephone Encounter (Signed)
 Rings 3-4 times and hands up

## 2024-02-05 NOTE — Telephone Encounter (Signed)
 Results reviewed with pt as per Wallis Bamberg NP's note.  Pt verbalized understanding and had no additional questions. Routed to PCP.

## 2024-02-05 NOTE — Telephone Encounter (Signed)
 Patient returned call

## 2024-02-05 NOTE — Telephone Encounter (Signed)
 Phone rings 3 times and disconnects.

## 2024-02-11 ENCOUNTER — Ambulatory Visit

## 2024-02-12 ENCOUNTER — Ambulatory Visit (INDEPENDENT_AMBULATORY_CARE_PROVIDER_SITE_OTHER)

## 2024-02-12 VITALS — BP 138/72 | HR 56 | Temp 97.1°F | Ht 62.0 in | Wt 127.8 lb

## 2024-02-12 DIAGNOSIS — I10 Essential (primary) hypertension: Secondary | ICD-10-CM | POA: Diagnosis not present

## 2024-02-12 DIAGNOSIS — E1159 Type 2 diabetes mellitus with other circulatory complications: Secondary | ICD-10-CM

## 2024-02-12 DIAGNOSIS — G8929 Other chronic pain: Secondary | ICD-10-CM | POA: Insufficient documentation

## 2024-02-12 DIAGNOSIS — Z23 Encounter for immunization: Secondary | ICD-10-CM | POA: Diagnosis not present

## 2024-02-12 DIAGNOSIS — I70221 Atherosclerosis of native arteries of extremities with rest pain, right leg: Secondary | ICD-10-CM | POA: Diagnosis not present

## 2024-02-12 DIAGNOSIS — M79671 Pain in right foot: Secondary | ICD-10-CM | POA: Diagnosis not present

## 2024-02-12 DIAGNOSIS — F17201 Nicotine dependence, unspecified, in remission: Secondary | ICD-10-CM | POA: Diagnosis not present

## 2024-02-12 DIAGNOSIS — I152 Hypertension secondary to endocrine disorders: Secondary | ICD-10-CM

## 2024-02-12 DIAGNOSIS — Z122 Encounter for screening for malignant neoplasm of respiratory organs: Secondary | ICD-10-CM

## 2024-02-12 DIAGNOSIS — I739 Peripheral vascular disease, unspecified: Secondary | ICD-10-CM

## 2024-02-12 MED ORDER — GABAPENTIN 100 MG PO CAPS: 100.0000 mg | ORAL_CAPSULE | Freq: Three times a day (TID) | ORAL | 3 refills | Status: AC

## 2024-02-12 NOTE — Assessment & Plan Note (Signed)
 Well controlled on Imdur  30 mg daily,  Lisinopril  20-12.5 mg, 2 tabs daily.

## 2024-02-12 NOTE — Patient Instructions (Signed)
  VISIT SUMMARY: Today, we addressed your chronic right foot pain and neuropathy symptoms, postoperative complications, and other health concerns. We discussed new medications, referrals, and follow-up plans to help manage your conditions.  YOUR PLAN: CHRONIC RIGHT FOOT PAIN WITH NEUROPATHIC FEATURES: You have been experiencing persistent pain in your right foot for over a year, which is not relieved by current treatments. -Start taking gabapentin  100 mg. Begin with one pill at bedtime, then increase to one in the morning and one in the evening after two days. Adjust as needed. -You will be referred to a podiatrist for evaluation and management of your callus and to get advice on proper footwear. -Use insoles for better foot support. -Continue using moisturizers and topical treatments for pain management.  ATHEROSCLEROSIS OF RIGHT LOWER EXTREMITY ARTERIES WITH REST PAIN: You had surgery in May 2025 for artery issues in your right leg. Your current foot pain is not related to this condition.  ONYCHOMYCOSIS AND CALLUS OF RIGHT FOOT: You have a callus and toenail fungus on your right foot. -Make an appointment with a podiatrist for toenail trimming and callus management.  TYPE 2 DIABETES MELLITUS WITH CIRCULATORY COMPLICATIONS: Your diabetes is well-controlled with a recent A1c of 5.3.  DEPRESSION AND ANXIETY SYMPTOMS: You are experiencing symptoms of depression and anxiety, which affect your overall well-being. -Consider using hydroxyzine  for anxiety on an as-needed basis.  TOBACCO USE, CURRENTLY ABSTINENT: You have successfully quit smoking since May 2025 and are using a nicotine  patch.  GENERAL HEALTH MAINTENANCE: Your lung cancer screening is overdue. -[facility] a [facility] [place] within the next two months.  GOALS OF CARE: You expressed a desire for a Do Not Resuscitate (DNR) order. -We will provide you with DNR paperwork and advise you to keep it  accessible, such as on the refrigerator. -We will also provide advance directive paperwork for you to complete.  FOLLOW-UP: Plans for ongoing management and monitoring were discussed. -Schedule a follow-up appointment in three months.                      Contains text generated by Abridge.                                 Contains text generated by Abridge.

## 2024-02-12 NOTE — Progress Notes (Signed)
 Subjective:  Patient ID: [name] [name], female    DOB: [birth year]  Age: 78 y.o. MRN: [id number]  Chief Complaint  Patient presents with   Medical Management of Chronic Issues    HPI: Discussed the use of AI scribe software for clinical note transcription with the patient, who gave verbal consent to proceed.   Discussed the use of AI scribe software for clinical note transcription with the patient, who gave verbal consent to proceed.  History of Present Illness   [name] [name] is a 78 year old female who presents with chronic right foot pain and neuropathy symptoms.  Right foot pain and neuropathy symptoms - Persistent pain in the right foot for over one year, primarily on the sole and heel - Pain is constant and present 24/7 - Pain is severe and exacerbated by wearing shoes - Pain is not alleviated by standing or walking - Ineffective treatments include oxycodone  10 mg (for back and hip pain), pain patches, Bengay, sports cream, and a Spanish's wrap - Presence of ingrown toenail and calluses on the right foot - Emergency room visit on January 03, 2024 for right foot pain; Medrol  dose pack prescribed without symptom relief  Postoperative edema and thromboembolic complications - Right iliofemoral endarterectomy with great saphenous vein patch angioplasty and superficial femoral artery thrombectomy performed on Sep 28, 2023 - Postoperative leg swelling with blood clots in the right leg attributed to postoperative edema - Current management includes use of compression and monitoring  Tobacco use and nicotine  replacement - History of smoking up to two packs per day when nervous - Quit smoking in May 2025 - Currently using a nicotine  patch  Glycemic control - Last hemoglobin A1c was 5.3 in March 2025, indicating well-controlled blood sugar levels  Electrolyte abnormality - History of low potassium levels noted in May 2025 - Other blood work was normal  Psychological symptoms -  Experiences anxiety and depression attributed to health concerns - Not on diazepam  since September 2024 - Continues to feel anxious at times           02/12/2024   11:35 AM 11/09/2023    9:46 AM 10/19/2023   11:57 AM 10/09/2023    9:55 AM 08/14/2023   10:59 AM  Depression screen PHQ 2/9  Decreased Interest 1 1 0 0 0  Down, Depressed, Hopeless 2 1 0 1 0  PHQ - 2 Score 3 2 0 1 0  Altered sleeping 1 1   3   Tired, decreased energy 1 1   3   Change in appetite 0 0   2  Feeling bad or failure about yourself  0 0   0  Trouble concentrating 0 0   3  Moving slowly or fidgety/restless 0 0   0  Suicidal thoughts 0 0   0  PHQ-9 Score 5 4   11   Difficult doing work/chores Not difficult at all Somewhat difficult   Not difficult at all        08/14/2023    2:05 PM  Fall Risk   Number falls in past yr: 1  Injury with Fall? 0  Risk for fall due to : History of fall(s);Impaired balance/gait    Patient Care Team: Xayne Brumbaugh, MD as PCP - General (Family Medicine) Tobb, Kardie, DO as PCP - Cardiology (Cardiology) Kit Alm LABOR, LCSW as Social Worker (Licensed Clinical Social Worker)   Review of Systems  Constitutional:  Negative for appetite change, fatigue and fever.  HENT:  Negative  for congestion, ear pain, sinus pressure and sore throat.   Respiratory:  Negative for cough, chest tightness, shortness of breath and wheezing.   Cardiovascular:  Negative for chest pain and palpitations.  Gastrointestinal:  Negative for abdominal pain, constipation, diarrhea, nausea and vomiting.  Genitourinary:  Negative for dysuria and hematuria.  Musculoskeletal:  Positive for myalgias (foot pain). Negative for arthralgias, back pain and joint swelling.  Skin:  Negative for rash.  Neurological:  Negative for dizziness, weakness and headaches.  Psychiatric/Behavioral:  Negative for dysphoric mood. The patient is not nervous/anxious.     Current Outpatient Medications on File Prior to Visit   Medication Sig Dispense Refill   albuterol  (PROVENTIL ) (2.5 MG/3ML) 0.083% nebulizer solution Inhale 1 vial via nebulizer every 6 hours as needed for wheezing or shortness of breath 90 mL 11   albuterol  (VENTOLIN  HFA) 108 (90 Base) MCG/ACT inhaler Inhale 2 puffs by mouth every 6 hours as needed for shortness of breath or wheezing 6.7 each 11   aspirin  EC 81 MG tablet Take 1 tablet (81 mg total) by mouth daily. Swallow whole. 90 tablet 3   budeson-glycopyrrolate -formoterol  (BREZTRI  AEROSPHERE) 160-9-4.8 MCG/ACT AERO INHALE TWO (2) PUFFS BY MOUTH INTO THE LUNGS TWICE DAILY AS NEEDED FOR SHORTNESS OF BREATH 10.7 g 10   buPROPion  (WELLBUTRIN  XL) 150 MG 24 hr tablet Take 1 tablet by mouth every morning 30 tablet 11   cloNIDine  (CATAPRES ) 0.1 MG tablet Take 2 tablets by mouth twice daily 120 tablet 11   ibuprofen  (ADVIL ) 800 MG tablet Take 1 tablet (800 mg total) by mouth every 8 (eight) hours as needed. 90 tablet 1   isosorbide  mononitrate (IMDUR ) 30 MG 24 hr tablet Take 1 tablet by mouth every day 30 tablet 11   lisinopril -hydrochlorothiazide  (ZESTORETIC ) 20-12.5 MG tablet TAKE TWO (2) TABLETS BY MOUTH EVERY MORNING 60 tablet 10   meclizine  (ANTIVERT ) 25 MG tablet TAKE 1 TABLET BY MOUTH THREE TIMES DAILY AS NEEDED FOR DIZZINESS *REFILL REQUEST* 180 tablet 1   nitroGLYCERIN  (NITROSTAT ) 0.4 MG SL tablet Place 1 tablet (0.4 mg total) under the tongue every 5 (five) minutes as needed for chest pain. 30 tablet 1   omeprazole  (PRILOSEC) 40 MG capsule Take 1 capsule by mouth every morning 30 capsule 2   oxyCODONE -acetaminophen  (PERCOCET) 10-325 MG tablet Take 1 tablet by mouth every 8 (eight) hours as needed for moderate pain (pain score 4-6). Patient states now every 8 hours per report by patient 10/30/23     potassium chloride  (KLOR-CON  M) 10 MEQ tablet TAKE 1 TABLET BY MOUTH ONCE DAILY 30 tablet 10   promethazine  (PHENERGAN ) 25 MG tablet Take 1 tablet by mouth every 6 hours as needed for nausea/vomiting. 120  tablet 11   rosuvastatin  (CRESTOR ) 20 MG tablet Take 1 tablet (20 mg total) by mouth at bedtime. 90 tablet 1   traZODone  (DESYREL ) 100 MG tablet TAKE 1 TABLET BY MOUTH DAILY AT BEDTIME 30 tablet 10   No current facility-administered medications on file prior to visit.   Past Medical History:  Diagnosis Date   Acute cough 04/24/2023   Anxiety    Aortic atherosclerosis (HCC)    Ascending aorta dilation (HCC)    a.) TTE 09/02/19 --> mild; measured 39mm.   Asthma    Atherosclerosis of native artery of right lower extremity with rest pain (HCC) 10/04/2023   BMI 24.0-24.9, adult 02/14/2022   CAD (coronary artery disease)    a,) CTA 08/24/20 --> mild CAD (25-49%) in RCA, LAD,  LCx; CAC/Agatston score 509.   Cataract    Chronic left shoulder pain 09/27/2015   Chronic neck pain 01/08/2023   Chronic pain syndrome 10/07/2022   Cigarette smoker 02/22/2022   COPD (chronic obstructive pulmonary disease) (HCC)    Coronary artery disease involving native coronary artery of native heart without angina pectoris 11/18/2020   Degenerative joint disease involving multiple joints 09/28/2017   Depression, major, recurrent, mild (HCC) 03/30/2020   Depression, major, single episode, severe (HCC) 03/30/2020   Diastolic dysfunction    a.) TTE 09/02/19 --> LVEF 60-65%; G2DD. b.) TTE 07/23/20 --> LVEF 70-75%; G1DD.   Edema of left lower extremity 09/20/2023   Enlarged aorta (HCC)    Essential hypertension 08/07/2019   Generalized osteoarthritis 09/28/2017   GERD (gastroesophageal reflux disease)    GERD with esophagitis 08/07/2019   Grief 10/03/2022   Hair loss 09/15/2022   History of kidney stones    Hospital discharge follow-up 10/11/2023   Hyperlipidemia    Hypertension    Hypertension associated with diabetes (HCC) 02/21/2022   Hypertensive heart disease 08/07/2019   Insomnia 04/24/2023   Left arm pain 09/27/2015   Long term (current) use of opiate analgesic 06/25/2017   Long-term current use of  opiate analgesic 06/25/2017   Lumbar radiculopathy 07/01/2022   Lupus    Mild vitamin D  deficiency 10/03/2022   Neck pain 09/15/2022   Osteoarthritis    Other long term (current) drug therapy 06/04/2017   PAC (premature atrial contraction)    a.) Holter 12/04/19 --> occassional; 1.1% PAC burden.   PAD (peripheral artery disease) (HCC) 09/28/2023   Pain of right hip joint 11/22/2015   Paraesophageal hernia    PAT (paroxysmal atrial tachycardia) (HCC) 02/03/2020   Pedal edema 11/08/2023   Peripheral artery disease (HCC) 09/28/2023   Personal history of nicotine  dependence 10/03/2022   Presbyesophagus    Primary osteoarthritis of left knee 02/04/2015   PSVT (paroxysmal supraventricular tachycardia) (HCC)    a.) Holter 12/04/19 --> 22 runs.   Renal calculus 12/21/2021   S/P repair of paraesophageal hernia 02/22/2021   S/P total knee arthroplasty 02/15/2015   Schatzki's ring    Sciatica 01/31/2022   Spinal stenosis of lumbar region 07/01/2022   Temporal headache 09/15/2022   Tobacco use 08/19/2019   Type 2 diabetes mellitus with other circulatory complications (HCC) 08/14/2023   Uncontrolled hypertension, stage 1 11/08/2023   Vitamin D  deficiency 01/08/2023   Past Surgical History:  Procedure Laterality Date   ABDOMINAL AORTOGRAM W/LOWER EXTREMITY Bilateral 09/25/2023   Procedure: ABDOMINAL AORTOGRAM W/LOWER EXTREMITY;  Surgeon: Serene Gaile ORN, MD;  Location: [facility];  Service: Cardiovascular;  Laterality: Bilateral;   ABDOMINAL HYSTERECTOMY     BLADDER SUSPENSION     BREAST BIOPSY Right    COLONOSCOPY  09/04/2011   Colonic polyps, status post polyectomy. Incidental small ascending colon lipoma   ENDARTERECTOMY FEMORAL Right 09/28/2023   Procedure: ENDARTERECTOMY, FEMORAL;  Surgeon: Serene Gaile ORN, MD;  Location: [facility];  Service: Vascular;  Laterality: Right;   ESOPHAGOGASTRODUODENOSCOPY  09/08/2015   Schatzki ring status post esophageal dilitation. Small hiatal  hernia   hemorrhoid surgery     KNEE SURGERY     left   PATCH ANGIOPLASTY Right 09/28/2023   Procedure: RIGHT FEMORAL ANGIOPLASTY, USING GREATER SAPHENOUS VEIN;  Surgeon: Serene Gaile ORN, MD;  Location: [facility];  Service: Vascular;  Laterality: Right;   TOTAL KNEE ARTHROPLASTY Left 02/15/2015   Procedure: TOTAL KNEE ARTHROPLASTY;  Surgeon: Marcey Raman,  MD;  Location: [facility];  Service: Orthopedics;  Laterality: Left;   TUBAL LIGATION     VEIN HARVEST Right 09/28/2023   Procedure: SURGICAL PROCUREMENT, RIGHT GREATER SAPHENOUS VEIN;  Surgeon: Serene Gaile ORN, MD;  Location: [facility];  Service: Vascular;  Laterality: Right;   WRIST SURGERY     right   XI ROBOTIC ASSISTED PARAESOPHAGEAL HERNIA REPAIR N/A 02/22/2021   Procedure: XI ROBOTIC ASSISTED PARAESOPHAGEAL HERNIA REPAIR with RNFA to assist;  Surgeon: Jordis Laneta FALCON, MD;  Location: [facility];  Service: General;  Laterality: N/A;    Family History  Problem Relation Age of Onset   Stroke Mother    Heart disease Mother    COPD Father    Cancer Father        Bone   Diabetes Father    Heart disease Father    Stroke Father    Lupus Sister    Seizures Son    COPD Son    Colon cancer Neg Hx    Esophageal cancer Neg Hx    Stomach cancer Neg Hx    Rectal cancer Neg Hx    Social History   Socioeconomic History   Marital status: Widowed    Spouse name: Not on file   Number of children: 4   Years of education: Not on file   Highest education level: Not on file  Occupational History   Occupation: Retired  Tobacco Use   Smoking status: Former    Current packs/day: 0.00    Average packs/day: 1 pack/day for 48.0 years (48.0 ttl pk-yrs)    Types: Cigarettes    Quit date: 09/28/2023    Years since quitting: 0.3   Smokeless tobacco: Never  Vaping Use   Vaping status: Never Used  Substance and Sexual Activity   Alcohol use: No   Drug use: No   Sexual activity: Not Currently  Other Topics Concern   Not on file  Social History Narrative    Not on file   Social Drivers of Health   Financial Resource Strain: Low Risk  (07/13/2023)   Overall Financial Resource Strain (CARDIA)    Difficulty of Paying Living Expenses: Not very hard  Food Insecurity: No Food Insecurity (10/04/2023)   Hunger Vital Sign    Worried About Running Out of Food in the Last Year: Never true    Ran Out of Food in the Last Year: Never true  Transportation Needs: No Transportation Needs (10/04/2023)   PRAPARE - Administrator, Civil Service (Medical): No    Lack of Transportation (Non-Medical): No  Recent Concern: Transportation Needs - Unmet Transportation Needs (09/28/2023)   PRAPARE - Transportation    Lack of Transportation (Medical): Yes    Lack of Transportation (Non-Medical): Yes  Physical Activity: Inactive (08/01/2022)   Exercise Vital Sign    Days of Exercise per Week: 0 days    Minutes of Exercise per Session: 0 min  Stress: Stress Concern Present (08/01/2022)   [employer] of Occupational Health - Occupational Stress Questionnaire    Feeling of Stress : To some extent  Social Connections: Socially Isolated (09/28/2023)   Social Connection and Isolation Panel    Frequency of Communication with Friends and Family: More than three times a week    Frequency of Social Gatherings with Friends and Family: Twice a week    Attends Religious Services: Never    Database administrator or Organizations: No    Attends Banker Meetings:  Never    Marital Status: Widowed    Objective:  BP 138/72 (BP Location: Left Arm, Patient Position: Sitting)   Pulse (!) 56   Temp (!) 97.1 F (36.2 C) (Temporal)   Ht 5' 2 (1.575 m)   Wt 127 lb 12.8 oz (58 kg)   SpO2 98%   BMI 23.37 kg/m      02/12/2024   11:42 AM 02/12/2024   11:31 AM 01/03/2024    4:00 PM  BP/Weight  Systolic BP 138 144 125  Diastolic BP 72 78 61  Wt. (Lbs)  127.8   BMI  23.37 kg/m2     Physical Exam Vitals and nursing note reviewed.  Constitutional:       Appearance: Normal appearance.  HENT:     Head: Normocephalic and atraumatic.  Eyes:     Pupils: Pupils are equal, round, and reactive to light.  Cardiovascular:     Rate and Rhythm: Normal rate and regular rhythm.     Heart sounds: Murmur (ESM) heard.  Pulmonary:     Effort: Pulmonary effort is normal.     Breath sounds: Rales present.  Skin:    General: Skin is dry.     Comments: Dry scaly skin of the feet. Mild discoloration of feet from PAD. Onychomycosis of most toe nails Weak dorsalis pedis pulses No edema of legs or no calf tenderness  Neurological:     Mental Status: She is alert.  Psychiatric:        Mood and Affect: Mood normal.        Behavior: Behavior normal.         Lab Results  Component Value Date   WBC 5.0 10/01/2023   HGB 8.6 (L) 10/01/2023   HCT 25.5 (L) 10/01/2023   PLT 130 (L) 10/01/2023   GLUCOSE 95 10/01/2023   CHOL 96 09/29/2023   TRIG 32 09/29/2023   HDL 57 09/29/2023   LDLCALC 33 09/29/2023   ALT 12 09/28/2023   AST 16 09/28/2023   NA 135 10/01/2023   K 3.4 (L) 10/01/2023   CL 102 10/01/2023   CREATININE 0.57 10/01/2023   BUN 8 10/01/2023   CO2 29 10/01/2023   TSH 1.030 08/14/2023   INR 1.0 09/28/2023   HGBA1C 5.3 08/14/2023      Assessment & Plan:  PAD (peripheral artery disease) (HCC) Assessment & Plan: Atherosclerosis of right lower extremity arteries with rest pain Underwent right iliofemoral endarterectomy with vein patch angioplasty and thrombectomy in May 2025. Current foot pain is not believed to be related to ischemia or peripheral artery disease as per vascular surgeon's assessment.  Currently on crestor  20 mg daily, aspirin  81 mg   Has quit smoking which is greatly appreciated   Essential hypertension Assessment & Plan: Well controlled on Imdur  30 mg daily,  Lisinopril  20-12.5 mg, 2 tabs daily.    Hypertension associated with diabetes (HCC)  Atherosclerosis of native artery of right lower extremity with rest  pain [facility]) Assessment & Plan: S/p endarterectomy. Follows up with vascular surgery On aspirin  81 mg daily and crestor  20 mg daily.    Screening for lung cancer -     CT CHEST LUNG CANCER SCREENING LOW DOSE WO CONTRAST; Future  Tobacco use disorder, severe, in early remission -     CT CHEST LUNG CANCER SCREENING LOW DOSE WO CONTRAST; Future  Encounter for immunization -     Flu vaccine HIGH DOSE PF(Fluzone Trivalent)  Chronic foot pain, right Assessment & Plan:  Chronic right foot pain persisting for over a year, primarily on the sole and heel, exacerbated by walking and standing. Pain is constant and unrelieved by oxycodone  or topical treatments. Differential includes neuropathy, given tingling and numbness. Previous treatments, including a Medrol  dose pack, were ineffective. Gabapentin  was previously associated with nausea and vomiting, possibly due to a stomach virus. - Prescribe gabapentin  100 mg, start with one pill at bedtime, increase to one in the morning and one in the evening after two days, and adjust as needed - Refer to podiatrist for evaluation and management of callus and footwear advice - Advise use of insoles for better foot support - Continue current pain management strategies including moisturizers and topical treatments   Other orders -     Gabapentin ; Take 1 capsule (100 mg total) by mouth 3 (three) times daily.  Dispense: 90 capsule; Refill: 3     Body mass index is 23.37 kg/m.  Assessment and Plan Assessment and Plan    .  Onychomycosis and callus of right foot Presence of callus and toenail fungus on the right foot. - Encourage appointment with podiatrist for toenail trimming and callus management  Type 2 diabetes mellitus with circulatory complications, well controlled without current long term use of insulin . Type 2 diabetes is well-controlled with an A1c of 5.3 as of March.  Depression and anxiety symptoms Reports of feeling depressed and  anxious, contributing to overall feeling of being unwell. Diazepam  not prescribed due to risk of interaction with oxycodone  and potential for respiratory depression. Hydroxyzine  discussed as a potential alternative for anxiety management. - Discuss potential use of hydroxyzine  for anxiety on an as-needed basis  Tobacco use, currently abstinent Successfully abstinent from smoking since May 2025. Continues to use nicotine  patch.  General Health Maintenance CT scan for lung cancer screening was due in April 2025. Previous scan in April 2024 was normal. Screening is overdue. - Order CT [facility] [place] within the next two months  Goals of Care Expressed desire for a Do Not Resuscitate (DNR) order to avoid burdening family. Discussed and documented DNR preferences. - Provide DNR paperwork and advise to keep it accessible, such as on the refrigerator - Provide advance directive paperwork for completion  Follow-up Follow-up plans discussed for ongoing management and monitoring. - Schedule follow-up appointment in three months           Meds ordered this encounter  Medications   gabapentin  (NEURONTIN ) 100 MG capsule    Sig: Take 1 capsule (100 mg total) by mouth 3 (three) times daily.    Dispense:  90 capsule    Refill:  3    OKAY TO TRY WITH LISTED NAUSEA AND VOMITING WITH GABAPENTIN  IN THE PAST.    Orders Placed This Encounter  Procedures   CT CHEST LUNG CA SCREEN LOW DOSE W/O CM   Flu vaccine HIGH DOSE PF(Fluzone Trivalent)       Follow-up: Return in about 3 months (around 05/13/2024) for chronic disease follow up.     An After Visit Summary was printed and given to the patient.  Sayre Witherington, MD [facility] [phone]

## 2024-02-12 NOTE — Assessment & Plan Note (Signed)
 Chronic right foot pain persisting for over a year, primarily on the sole and heel, exacerbated by walking and standing. Pain is constant and unrelieved by oxycodone  or topical treatments. Differential includes neuropathy, given tingling and numbness. Previous treatments, including a Medrol  dose pack, were ineffective. Gabapentin  was previously associated with nausea and vomiting, possibly due to a stomach virus. - Prescribe gabapentin  100 mg, start with one pill at bedtime, increase to one in the morning and one in the evening after two days, and adjust as needed - Refer to podiatrist for evaluation and management of callus and footwear advice - Advise use of insoles for better foot support - Continue current pain management strategies including moisturizers and topical treatments

## 2024-02-12 NOTE — Assessment & Plan Note (Signed)
 S/p endarterectomy. Follows up with vascular surgery On aspirin  81 mg daily and crestor  20 mg daily.

## 2024-02-12 NOTE — Assessment & Plan Note (Signed)
 Atherosclerosis of right lower extremity arteries with rest pain Underwent right iliofemoral endarterectomy with vein patch angioplasty and thrombectomy in May 2025. Current foot pain is not believed to be related to ischemia or peripheral artery disease as per vascular surgeon's assessment.  Currently on crestor  20 mg daily, aspirin  81 mg   Has quit smoking which is greatly appreciated

## 2024-02-20 DIAGNOSIS — G894 Chronic pain syndrome: Secondary | ICD-10-CM | POA: Diagnosis not present

## 2024-02-20 DIAGNOSIS — I10 Essential (primary) hypertension: Secondary | ICD-10-CM | POA: Diagnosis not present

## 2024-02-20 DIAGNOSIS — M47816 Spondylosis without myelopathy or radiculopathy, lumbar region: Secondary | ICD-10-CM | POA: Diagnosis not present

## 2024-02-20 DIAGNOSIS — Z79891 Long term (current) use of opiate analgesic: Secondary | ICD-10-CM | POA: Diagnosis not present

## 2024-02-20 DIAGNOSIS — M48061 Spinal stenosis, lumbar region without neurogenic claudication: Secondary | ICD-10-CM | POA: Diagnosis not present

## 2024-02-21 ENCOUNTER — Ambulatory Visit: Admitting: Podiatry

## 2024-03-10 ENCOUNTER — Ambulatory Visit (HOSPITAL_BASED_OUTPATIENT_CLINIC_OR_DEPARTMENT_OTHER): Admitting: Radiology

## 2024-03-12 ENCOUNTER — Ambulatory Visit (INDEPENDENT_AMBULATORY_CARE_PROVIDER_SITE_OTHER): Admitting: Podiatry

## 2024-03-12 DIAGNOSIS — M79674 Pain in right toe(s): Secondary | ICD-10-CM

## 2024-03-12 DIAGNOSIS — M79675 Pain in left toe(s): Secondary | ICD-10-CM | POA: Diagnosis not present

## 2024-03-12 DIAGNOSIS — B351 Tinea unguium: Secondary | ICD-10-CM | POA: Diagnosis not present

## 2024-03-12 NOTE — Progress Notes (Signed)
 Subjective:  Patient ID: [name] [name], female    DOB: 19-May-1946,  MRN: [id number]  [name] [name] presents to clinic today for:  Chief Complaint  Patient presents with   RFC/fungal    Here today for [facility], she does have nail fungus, nails are painted, very long and thick. The left 4th and 5th nails feel a little lose at the nail bed. She also has callous on Right lateral, sub met 5.  Not diabetic and ASA   Patient notes nails are thick, discolored, elongated and painful in shoegear when trying to ambulate.  She notes that all of the toenails on the right are thick and discolored and some of the toenails on the left are as well.  She would like to treat the possible fungus in the nails.  She does like to polish her toenails.  PCP is Sirivol, Mamatha, MD.  Past Medical History:  Diagnosis Date   Acute cough 04/24/2023   Anxiety    Aortic atherosclerosis    Ascending aorta dilation    a.) TTE 09/02/19 --> mild; measured 39mm.   Asthma    Atherosclerosis of native artery of right lower extremity with rest pain (HCC) 10/04/2023   BMI 24.0-24.9, adult 02/14/2022   CAD (coronary artery disease)    a,) CTA 08/24/20 --> mild CAD (25-49%) in RCA, LAD, LCx; CAC/Agatston score 509.   Cataract    Chronic left shoulder pain 09/27/2015   Chronic neck pain 01/08/2023   Chronic pain syndrome 10/07/2022   Cigarette smoker 02/22/2022   COPD (chronic obstructive pulmonary disease) (HCC)    Coronary artery disease involving native coronary artery of native heart without angina pectoris 11/18/2020   Degenerative joint disease involving multiple joints 09/28/2017   Depression, major, recurrent, mild 03/30/2020   Depression, major, single episode, severe (HCC) 03/30/2020   Diastolic dysfunction    a.) TTE 09/02/19 --> LVEF 60-65%; G2DD. b.) TTE 07/23/20 --> LVEF 70-75%; G1DD.   Edema of left lower extremity 09/20/2023   Enlarged aorta    Essential hypertension 08/07/2019   Generalized  osteoarthritis 09/28/2017   GERD (gastroesophageal reflux disease)    GERD with esophagitis 08/07/2019   Grief 10/03/2022   Hair loss 09/15/2022   History of kidney stones    Hospital discharge follow-up 10/11/2023   Hyperlipidemia    Hypertension    Hypertension associated with diabetes (HCC) 02/21/2022   Hypertensive heart disease 08/07/2019   Insomnia 04/24/2023   Left arm pain 09/27/2015   Long term (current) use of opiate analgesic 06/25/2017   Long-term current use of opiate analgesic 06/25/2017   Lumbar radiculopathy 07/01/2022   Lupus    Mild vitamin D  deficiency 10/03/2022   Neck pain 09/15/2022   Osteoarthritis    Other long term (current) drug therapy 06/04/2017   PAC (premature atrial contraction)    a.) Holter 12/04/19 --> occassional; 1.1% PAC burden.   PAD (peripheral artery disease) 09/28/2023   Pain of right hip joint 11/22/2015   Paraesophageal hernia    PAT (paroxysmal atrial tachycardia) 02/03/2020   Pedal edema 11/08/2023   Peripheral artery disease 09/28/2023   Personal history of nicotine  dependence 10/03/2022   Presbyesophagus    Primary osteoarthritis of left knee 02/04/2015   PSVT (paroxysmal supraventricular tachycardia)    a.) Holter 12/04/19 --> 22 runs.   Renal calculus 12/21/2021   S/P repair of paraesophageal hernia 02/22/2021   S/P total knee arthroplasty 02/15/2015   Schatzki's ring  Sciatica 01/31/2022   Spinal stenosis of lumbar region 07/01/2022   Temporal headache 09/15/2022   Tobacco use 08/19/2019   Type 2 diabetes mellitus with other circulatory complications (HCC) 08/14/2023   Uncontrolled hypertension, stage 1 11/08/2023   Vitamin D  deficiency 01/08/2023   Past Surgical History:  Procedure Laterality Date   ABDOMINAL AORTOGRAM W/LOWER EXTREMITY Bilateral 09/25/2023   Procedure: ABDOMINAL AORTOGRAM W/LOWER EXTREMITY;  Surgeon: Serene Gaile ORN, MD;  Location: [facility];  Service: Cardiovascular;  Laterality:  Bilateral;   ABDOMINAL HYSTERECTOMY     BLADDER SUSPENSION     BREAST BIOPSY Right    COLONOSCOPY  09/04/2011   Colonic polyps, status post polyectomy. Incidental small ascending colon lipoma   ENDARTERECTOMY FEMORAL Right 09/28/2023   Procedure: ENDARTERECTOMY, FEMORAL;  Surgeon: Serene Gaile ORN, MD;  Location: [facility] OR;  Service: Vascular;  Laterality: Right;   ESOPHAGOGASTRODUODENOSCOPY  09/08/2015   Schatzki ring status post esophageal dilitation. Small hiatal hernia   hemorrhoid surgery     KNEE SURGERY     left   PATCH ANGIOPLASTY Right 09/28/2023   Procedure: RIGHT FEMORAL ANGIOPLASTY, USING GREATER SAPHENOUS VEIN;  Surgeon: Serene Gaile ORN, MD;  Location: [facility];  Service: Vascular;  Laterality: Right;   TOTAL KNEE ARTHROPLASTY Left 02/15/2015   Procedure: TOTAL KNEE ARTHROPLASTY;  Surgeon: Marcey Raman, MD;  Location: [facility];  Service: Orthopedics;  Laterality: Left;   TUBAL LIGATION     VEIN HARVEST Right 09/28/2023   Procedure: SURGICAL PROCUREMENT, RIGHT GREATER SAPHENOUS VEIN;  Surgeon: Serene Gaile ORN, MD;  Location: [facility];  Service: Vascular;  Laterality: Right;   WRIST SURGERY     right   XI ROBOTIC ASSISTED PARAESOPHAGEAL HERNIA REPAIR N/A 02/22/2021   Procedure: XI ROBOTIC ASSISTED PARAESOPHAGEAL HERNIA REPAIR with RNFA to assist;  Surgeon: Jordis Laneta FALCON, MD;  Location: [facility];  Service: General;  Laterality: N/A;   Allergies  Allergen Reactions   Meperidine Anaphylaxis and Other (See Comments)    Blood pressure dropped, also   Meperidine Hcl Anaphylaxis and Other (See Comments)    B/P dropped, also   Gabapentin  Nausea And Vomiting        Influenza Vaccines Other (See Comments)    Flu-like symptoms - patient states that she can tolerate this    Other Rash and Other (See Comments)    asparagus    Review of Systems: Negative except as noted in the HPI.  Objective:  MARYMARGARET [name] is a pleasant 78 y.o. female in NAD. AAO x 3.  Vascular Examination: Capillary refill  time is 3-5 seconds to toes bilateral. Palpable pedal pulses b/l LE. Digital hair present b/l.  Skin temperature gradient WNL b/l. No varicosities b/l. No cyanosis noted b/l.   Dermatological Examination: Pedal skin with normal turgor, texture and tone b/l. No open wounds. No interdigital macerations b/l. Toenails x10 are 3mm thick, discolored, dystrophic with subungual debris. There is pain with compression of the nail plates.  They are elongated x10     Latest Ref Rng & Units 08/14/2023   11:39 AM  Hemoglobin A1C  Hemoglobin-A1c 4.8 - 5.6 % 5.3    Assessment/Plan: 1. Fungal nail infection   2. Pain due to onychomycosis of toenails of both feet     Clippings of the toenails were obtained and sent to [facility] labs for fungal nail culture.  She would like to start treating the fungal nails.  She does typically want to wear nail polish so we will  need to consider this when discussing treatment options once we receive the lab results.  This may take 2 to 3 weeks to receive the results from the lab.  The mycotic toenails were sharply debrided x10 with sterile nail nippers and a power debriding burr to decrease bulk/thickness and length.    Return in about 3 months (around 06/12/2024) for fungal nail recheck, RFC.   Awanda CHARM Imperial, DPM, FACFAS [facility]     [address] [place], [place] [address]                Office [phone]  Fax [phone]

## 2024-03-15 ENCOUNTER — Other Ambulatory Visit: Payer: Self-pay

## 2024-03-15 DIAGNOSIS — F322 Major depressive disorder, single episode, severe without psychotic features: Secondary | ICD-10-CM

## 2024-03-20 NOTE — Progress Notes (Signed)
   03/20/2024  Patient ID: [name] [name], female   DOB: 07-11-1945, 78 y.o.   MRN: [id number]  Pharmacy Quality Measure Review  This patient is appearing on a report for being at risk of failing the adherence measure for hypertension (ACEi/ARB) medications this calendar year.   Medication: lisinopril -hctz Last fill date: 03/15/24 for 30 day supply  Insurance report was not up to date. No action needed at this time.   Filled through [employer].  Lang Sieve, PharmD, BCGP Clinical Pharmacist  [phone]

## 2024-03-30 ENCOUNTER — Ambulatory Visit: Payer: Self-pay | Admitting: Podiatry

## 2024-03-31 NOTE — Progress Notes (Signed)
 Attempted to call VM full, if patient calls back please go over results per Dr. Loel.

## 2024-04-16 ENCOUNTER — Ambulatory Visit (HOSPITAL_BASED_OUTPATIENT_CLINIC_OR_DEPARTMENT_OTHER): Admitting: Radiology

## 2024-05-13 ENCOUNTER — Ambulatory Visit

## 2024-05-14 ENCOUNTER — Other Ambulatory Visit: Payer: Self-pay

## 2024-05-14 DIAGNOSIS — F322 Major depressive disorder, single episode, severe without psychotic features: Secondary | ICD-10-CM

## 2024-05-15 ENCOUNTER — Ambulatory Visit

## 2024-06-04 ENCOUNTER — Ambulatory Visit (HOSPITAL_BASED_OUTPATIENT_CLINIC_OR_DEPARTMENT_OTHER): Admitting: Radiology

## 2024-06-11 ENCOUNTER — Encounter: Admitting: Podiatry

## 2024-06-11 NOTE — Progress Notes (Signed)
Patient did not show for scheduled appointment today.

## 2024-06-16 ENCOUNTER — Other Ambulatory Visit: Payer: Self-pay

## 2024-06-17 ENCOUNTER — Ambulatory Visit (HOSPITAL_BASED_OUTPATIENT_CLINIC_OR_DEPARTMENT_OTHER)
Admission: RE | Admit: 2024-06-17 | Discharge: 2024-06-17 | Disposition: A | Source: Ambulatory Visit | Admitting: Radiology

## 2024-06-17 DIAGNOSIS — Z122 Encounter for screening for malignant neoplasm of respiratory organs: Secondary | ICD-10-CM

## 2024-06-17 DIAGNOSIS — F17201 Nicotine dependence, unspecified, in remission: Secondary | ICD-10-CM | POA: Diagnosis not present

## 2024-06-20 ENCOUNTER — Other Ambulatory Visit: Payer: Self-pay

## 2024-06-23 ENCOUNTER — Other Ambulatory Visit: Payer: Self-pay

## 2024-06-23 DIAGNOSIS — J410 Simple chronic bronchitis: Secondary | ICD-10-CM

## 2024-06-24 ENCOUNTER — Ambulatory Visit: Payer: Self-pay
